# Patient Record
Sex: Female | Born: 1965 | Race: White | Hispanic: No | Marital: Married | Smoking: Never smoker
Health system: Southern US, Community
[De-identification: ages and names within clinical notes are randomized; demographics above are authoritative.]

## PROBLEM LIST (undated history)

## (undated) DIAGNOSIS — C50919 Malignant neoplasm of unspecified site of unspecified female breast: Secondary | ICD-10-CM

## (undated) DIAGNOSIS — IMO0001 Reserved for inherently not codable concepts without codable children: Secondary | ICD-10-CM

## (undated) DIAGNOSIS — Z8673 Personal history of transient ischemic attack (TIA), and cerebral infarction without residual deficits: Secondary | ICD-10-CM

## (undated) DIAGNOSIS — C801 Malignant (primary) neoplasm, unspecified: Secondary | ICD-10-CM

## (undated) DIAGNOSIS — F32A Depression, unspecified: Secondary | ICD-10-CM

## (undated) DIAGNOSIS — R51 Headache: Secondary | ICD-10-CM

## (undated) DIAGNOSIS — K219 Gastro-esophageal reflux disease without esophagitis: Secondary | ICD-10-CM

## (undated) DIAGNOSIS — F329 Major depressive disorder, single episode, unspecified: Secondary | ICD-10-CM

## (undated) DIAGNOSIS — F419 Anxiety disorder, unspecified: Secondary | ICD-10-CM

## (undated) HISTORY — PX: ABDOMINAL HYSTERECTOMY: SHX81

## (undated) HISTORY — DX: Malignant (primary) neoplasm, unspecified: C80.1

## (undated) HISTORY — PX: OTHER SURGICAL HISTORY: SHX169

## (undated) HISTORY — DX: Depression, unspecified: F32.A

## (undated) HISTORY — DX: Major depressive disorder, single episode, unspecified: F32.9

## (undated) HISTORY — DX: Malignant neoplasm of unspecified site of unspecified female breast: C50.919

## (undated) HISTORY — PX: PORTACATH PLACEMENT: SHX2246

## (undated) HISTORY — PX: BREAST RECONSTRUCTION: SHX9

## (undated) HISTORY — DX: Headache: R51

## (undated) HISTORY — DX: Personal history of transient ischemic attack (TIA), and cerebral infarction without residual deficits: Z86.73

## (undated) HISTORY — DX: Anxiety disorder, unspecified: F41.9

## (undated) HISTORY — PX: MASTECTOMY W/ NODES PARTIAL: SUR854

## (undated) HISTORY — PX: MASTECTOMY: SHX3

---

## 2000-02-10 ENCOUNTER — Other Ambulatory Visit: Admission: RE | Admit: 2000-02-10 | Discharge: 2000-02-10 | Payer: Self-pay | Admitting: Obstetrics and Gynecology

## 2001-02-27 ENCOUNTER — Other Ambulatory Visit: Admission: RE | Admit: 2001-02-27 | Discharge: 2001-02-27 | Payer: Self-pay | Admitting: Obstetrics and Gynecology

## 2002-01-10 ENCOUNTER — Other Ambulatory Visit: Admission: RE | Admit: 2002-01-10 | Discharge: 2002-01-10 | Payer: Self-pay | Admitting: Obstetrics and Gynecology

## 2002-06-03 ENCOUNTER — Inpatient Hospital Stay (HOSPITAL_COMMUNITY): Admission: AD | Admit: 2002-06-03 | Discharge: 2002-06-03 | Payer: Self-pay | Admitting: Obstetrics and Gynecology

## 2002-06-03 ENCOUNTER — Encounter: Payer: Self-pay | Admitting: Obstetrics and Gynecology

## 2002-06-27 ENCOUNTER — Observation Stay (HOSPITAL_COMMUNITY): Admission: AD | Admit: 2002-06-27 | Discharge: 2002-06-28 | Payer: Self-pay | Admitting: Obstetrics and Gynecology

## 2002-07-16 ENCOUNTER — Encounter (INDEPENDENT_AMBULATORY_CARE_PROVIDER_SITE_OTHER): Payer: Self-pay | Admitting: Specialist

## 2002-07-16 ENCOUNTER — Inpatient Hospital Stay (HOSPITAL_COMMUNITY): Admission: AD | Admit: 2002-07-16 | Discharge: 2002-07-18 | Payer: Self-pay | Admitting: Obstetrics and Gynecology

## 2002-08-27 ENCOUNTER — Other Ambulatory Visit: Admission: RE | Admit: 2002-08-27 | Discharge: 2002-08-27 | Payer: Self-pay | Admitting: Obstetrics and Gynecology

## 2003-09-11 ENCOUNTER — Other Ambulatory Visit: Admission: RE | Admit: 2003-09-11 | Discharge: 2003-09-11 | Payer: Self-pay | Admitting: Obstetrics and Gynecology

## 2004-04-30 ENCOUNTER — Inpatient Hospital Stay (HOSPITAL_COMMUNITY): Admission: AD | Admit: 2004-04-30 | Discharge: 2004-05-02 | Payer: Self-pay | Admitting: Obstetrics and Gynecology

## 2004-06-10 ENCOUNTER — Other Ambulatory Visit: Admission: RE | Admit: 2004-06-10 | Discharge: 2004-06-10 | Payer: Self-pay | Admitting: Obstetrics and Gynecology

## 2005-07-26 ENCOUNTER — Other Ambulatory Visit: Admission: RE | Admit: 2005-07-26 | Discharge: 2005-07-26 | Payer: Self-pay | Admitting: Obstetrics and Gynecology

## 2005-10-21 ENCOUNTER — Encounter: Admission: RE | Admit: 2005-10-21 | Discharge: 2005-10-21 | Payer: Self-pay | Admitting: Obstetrics and Gynecology

## 2005-10-21 ENCOUNTER — Encounter (INDEPENDENT_AMBULATORY_CARE_PROVIDER_SITE_OTHER): Payer: Self-pay | Admitting: Diagnostic Radiology

## 2005-10-21 ENCOUNTER — Encounter (INDEPENDENT_AMBULATORY_CARE_PROVIDER_SITE_OTHER): Payer: Self-pay | Admitting: Specialist

## 2005-11-02 ENCOUNTER — Encounter: Admission: RE | Admit: 2005-11-02 | Discharge: 2005-11-02 | Payer: Self-pay | Admitting: Surgery

## 2005-11-10 ENCOUNTER — Encounter: Admission: RE | Admit: 2005-11-10 | Discharge: 2005-11-10 | Payer: Self-pay | Admitting: Surgery

## 2005-11-15 ENCOUNTER — Encounter: Admission: RE | Admit: 2005-11-15 | Discharge: 2005-11-15 | Payer: Self-pay | Admitting: Surgery

## 2005-11-18 ENCOUNTER — Ambulatory Visit (HOSPITAL_BASED_OUTPATIENT_CLINIC_OR_DEPARTMENT_OTHER): Admission: RE | Admit: 2005-11-18 | Discharge: 2005-11-19 | Payer: Self-pay | Admitting: Surgery

## 2005-11-18 ENCOUNTER — Encounter (INDEPENDENT_AMBULATORY_CARE_PROVIDER_SITE_OTHER): Payer: Self-pay | Admitting: Specialist

## 2005-11-18 ENCOUNTER — Ambulatory Visit (HOSPITAL_COMMUNITY): Admission: RE | Admit: 2005-11-18 | Discharge: 2005-11-18 | Payer: Self-pay | Admitting: Surgery

## 2005-11-18 ENCOUNTER — Encounter: Admission: RE | Admit: 2005-11-18 | Discharge: 2005-11-18 | Payer: Self-pay | Admitting: Surgery

## 2005-11-22 ENCOUNTER — Ambulatory Visit: Payer: Self-pay | Admitting: Oncology

## 2005-12-14 ENCOUNTER — Encounter: Payer: Self-pay | Admitting: Cardiovascular Disease

## 2005-12-14 ENCOUNTER — Ambulatory Visit: Payer: Self-pay

## 2005-12-15 ENCOUNTER — Ambulatory Visit (HOSPITAL_BASED_OUTPATIENT_CLINIC_OR_DEPARTMENT_OTHER): Admission: RE | Admit: 2005-12-15 | Discharge: 2005-12-15 | Payer: Self-pay | Admitting: Surgery

## 2005-12-23 ENCOUNTER — Ambulatory Visit (HOSPITAL_COMMUNITY): Admission: RE | Admit: 2005-12-23 | Discharge: 2005-12-23 | Payer: Self-pay | Admitting: Oncology

## 2006-01-13 ENCOUNTER — Ambulatory Visit: Payer: Self-pay | Admitting: Oncology

## 2006-03-07 ENCOUNTER — Ambulatory Visit: Payer: Self-pay | Admitting: Oncology

## 2006-03-16 LAB — CBC WITH DIFFERENTIAL/PLATELET
BASO%: 1 % (ref 0.0–2.0)
Eosinophils Absolute: 0 10*3/uL (ref 0.0–0.5)
MCHC: 34.6 g/dL (ref 32.0–36.0)
MONO#: 0.2 10*3/uL (ref 0.1–0.9)
NEUT#: 0.4 10*3/uL — CL (ref 1.5–6.5)
RBC: 3.69 10*6/uL — ABNORMAL LOW (ref 3.70–5.32)
WBC: 1.3 10*3/uL — ABNORMAL LOW (ref 3.9–10.0)
lymph#: 0.8 10*3/uL — ABNORMAL LOW (ref 0.9–3.3)

## 2006-03-30 LAB — CBC WITH DIFFERENTIAL/PLATELET
BASO%: 0.3 % (ref 0.0–2.0)
Basophils Absolute: 0 10*3/uL (ref 0.0–0.1)
EOS%: 0 % (ref 0.0–7.0)
HCT: 37.5 % (ref 34.8–46.6)
HGB: 13.3 g/dL (ref 11.6–15.9)
LYMPH%: 4 % — ABNORMAL LOW (ref 14.0–48.0)
MCH: 33.3 pg (ref 26.0–34.0)
MCHC: 35.4 g/dL (ref 32.0–36.0)
MCV: 94.2 fL (ref 81.0–101.0)
NEUT%: 93.1 % — ABNORMAL HIGH (ref 39.6–76.8)
Platelets: 346 10*3/uL (ref 145–400)
lymph#: 0.5 10*3/uL — ABNORMAL LOW (ref 0.9–3.3)

## 2006-03-30 LAB — COMPREHENSIVE METABOLIC PANEL
AST: 22 U/L (ref 0–37)
BUN: 16 mg/dL (ref 6–23)
Calcium: 9.7 mg/dL (ref 8.4–10.5)
Chloride: 105 mEq/L (ref 96–112)
Creatinine, Ser: 0.8 mg/dL (ref 0.4–1.2)
Total Bilirubin: 0.7 mg/dL (ref 0.3–1.2)

## 2006-04-06 LAB — CBC WITH DIFFERENTIAL/PLATELET
BASO%: 1.5 % (ref 0.0–2.0)
Basophils Absolute: 0 10*3/uL (ref 0.0–0.1)
Eosinophils Absolute: 0 10*3/uL (ref 0.0–0.5)
HCT: 33 % — ABNORMAL LOW (ref 34.8–46.6)
HGB: 11.3 g/dL — ABNORMAL LOW (ref 11.6–15.9)
LYMPH%: 51.2 % — ABNORMAL HIGH (ref 14.0–48.0)
MCHC: 34.4 g/dL (ref 32.0–36.0)
MONO#: 0.1 10*3/uL (ref 0.1–0.9)
NEUT#: 0.3 10*3/uL — CL (ref 1.5–6.5)
NEUT%: 33.4 % — ABNORMAL LOW (ref 39.6–76.8)
Platelets: 78 10*3/uL — ABNORMAL LOW (ref 145–400)
WBC: 0.9 10*3/uL — CL (ref 3.9–10.0)
lymph#: 0.5 10*3/uL — ABNORMAL LOW (ref 0.9–3.3)

## 2006-04-20 LAB — CBC WITH DIFFERENTIAL/PLATELET
Basophils Absolute: 0 10*3/uL (ref 0.0–0.1)
EOS%: 0.1 % (ref 0.0–7.0)
Eosinophils Absolute: 0 10*3/uL (ref 0.0–0.5)
HCT: 37.9 % (ref 34.8–46.6)
HGB: 12.8 g/dL (ref 11.6–15.9)
LYMPH%: 4.8 % — ABNORMAL LOW (ref 14.0–48.0)
MCH: 33.8 pg (ref 26.0–34.0)
MCV: 100 fL (ref 81.0–101.0)
MONO%: 4.7 % (ref 0.0–13.0)
NEUT#: 12.6 10*3/uL — ABNORMAL HIGH (ref 1.5–6.5)
NEUT%: 90.2 % — ABNORMAL HIGH (ref 39.6–76.8)
Platelets: 317 10*3/uL (ref 145–400)
RDW: 15.4 % — ABNORMAL HIGH (ref 11.3–14.5)

## 2006-04-21 ENCOUNTER — Ambulatory Visit: Payer: Self-pay | Admitting: Oncology

## 2006-04-25 ENCOUNTER — Ambulatory Visit: Admission: RE | Admit: 2006-04-25 | Discharge: 2006-07-24 | Payer: Self-pay | Admitting: Radiation Oncology

## 2006-04-26 LAB — CBC WITH DIFFERENTIAL/PLATELET
EOS%: 0.7 % (ref 0.0–7.0)
Eosinophils Absolute: 0 10*3/uL (ref 0.0–0.5)
LYMPH%: 71.4 % — ABNORMAL HIGH (ref 14.0–48.0)
MCH: 34.6 pg — ABNORMAL HIGH (ref 26.0–34.0)
MCHC: 35.5 g/dL (ref 32.0–36.0)
MCV: 97.5 fL (ref 81.0–101.0)
MONO%: 7.7 % (ref 0.0–13.0)
Platelets: 59 10*3/uL — ABNORMAL LOW (ref 145–400)
RBC: 3.56 10*6/uL — ABNORMAL LOW (ref 3.70–5.32)
RDW: 13.4 % (ref 11.3–14.5)

## 2006-05-01 ENCOUNTER — Encounter: Admission: RE | Admit: 2006-05-01 | Discharge: 2006-05-01 | Payer: Self-pay | Admitting: Radiation Oncology

## 2006-05-03 LAB — CBC WITH DIFFERENTIAL/PLATELET
BASO%: 0.7 % (ref 0.0–2.0)
Eosinophils Absolute: 0 10*3/uL (ref 0.0–0.5)
LYMPH%: 19.5 % (ref 14.0–48.0)
MCHC: 33.5 g/dL (ref 32.0–36.0)
MONO#: 0.5 10*3/uL (ref 0.1–0.9)
MONO%: 7.9 % (ref 0.0–13.0)
NEUT#: 4.5 10*3/uL (ref 1.5–6.5)
Platelets: 194 10*3/uL (ref 145–400)
RBC: 3.62 10*6/uL — ABNORMAL LOW (ref 3.70–5.32)
RDW: 14.5 % (ref 11.3–14.5)
WBC: 6.3 10*3/uL (ref 3.9–10.0)

## 2006-05-11 ENCOUNTER — Ambulatory Visit (HOSPITAL_COMMUNITY): Admission: RE | Admit: 2006-05-11 | Discharge: 2006-05-11 | Payer: Self-pay | Admitting: Oncology

## 2006-05-19 ENCOUNTER — Encounter: Payer: Self-pay | Admitting: Vascular Surgery

## 2006-05-19 ENCOUNTER — Ambulatory Visit (HOSPITAL_BASED_OUTPATIENT_CLINIC_OR_DEPARTMENT_OTHER): Admission: RE | Admit: 2006-05-19 | Discharge: 2006-05-19 | Payer: Self-pay | Admitting: Oncology

## 2006-06-06 ENCOUNTER — Ambulatory Visit (HOSPITAL_COMMUNITY): Admission: RE | Admit: 2006-06-06 | Discharge: 2006-06-06 | Payer: Self-pay | Admitting: Orthopedic Surgery

## 2006-06-06 LAB — CBC WITH DIFFERENTIAL/PLATELET
Basophils Absolute: 0 10*3/uL (ref 0.0–0.1)
EOS%: 14.2 % — ABNORMAL HIGH (ref 0.0–7.0)
Eosinophils Absolute: 0.6 10*3/uL — ABNORMAL HIGH (ref 0.0–0.5)
HCT: 37.8 % (ref 34.8–46.6)
HGB: 13 g/dL (ref 11.6–15.9)
MCH: 33.3 pg (ref 26.0–34.0)
NEUT#: 2.7 10*3/uL (ref 1.5–6.5)
NEUT%: 58.8 % (ref 39.6–76.8)
lymph#: 0.9 10*3/uL (ref 0.9–3.3)

## 2006-06-13 LAB — CBC WITH DIFFERENTIAL/PLATELET
BASO%: 0.4 % (ref 0.0–2.0)
Eosinophils Absolute: 0.4 10*3/uL (ref 0.0–0.5)
LYMPH%: 18.4 % (ref 14.0–48.0)
MCHC: 34.1 g/dL (ref 32.0–36.0)
MCV: 96.9 fL (ref 81.0–101.0)
MONO#: 0.3 10*3/uL (ref 0.1–0.9)
MONO%: 6.7 % (ref 0.0–13.0)
NEUT#: 2.6 10*3/uL (ref 1.5–6.5)
Platelets: 186 10*3/uL (ref 145–400)
RBC: 3.99 10*6/uL (ref 3.70–5.32)
RDW: 12.6 % (ref 11.3–14.5)
WBC: 4 10*3/uL (ref 3.9–10.0)

## 2006-06-20 LAB — CBC WITH DIFFERENTIAL/PLATELET
BASO%: 0.3 % (ref 0.0–2.0)
Eosinophils Absolute: 0.3 10*3/uL (ref 0.0–0.5)
MCHC: 34.7 g/dL (ref 32.0–36.0)
MONO#: 0.3 10*3/uL (ref 0.1–0.9)
NEUT#: 2.5 10*3/uL (ref 1.5–6.5)
Platelets: 172 10*3/uL (ref 145–400)
RBC: 4.08 10*6/uL (ref 3.70–5.32)
RDW: 12.3 % (ref 11.3–14.5)
WBC: 3.9 10*3/uL (ref 3.9–10.0)
lymph#: 0.8 10*3/uL — ABNORMAL LOW (ref 0.9–3.3)

## 2006-06-27 LAB — CBC WITH DIFFERENTIAL/PLATELET
BASO%: 0.6 % (ref 0.0–2.0)
Basophils Absolute: 0 10*3/uL (ref 0.0–0.1)
Eosinophils Absolute: 0.2 10*3/uL (ref 0.0–0.5)
HCT: 38.9 % (ref 34.8–46.6)
HGB: 13.5 g/dL (ref 11.6–15.9)
LYMPH%: 21.5 % (ref 14.0–48.0)
MCHC: 34.6 g/dL (ref 32.0–36.0)
MONO#: 0.3 10*3/uL (ref 0.1–0.9)
NEUT#: 2.1 10*3/uL (ref 1.5–6.5)
NEUT%: 63.9 % (ref 39.6–76.8)
Platelets: 166 10*3/uL (ref 145–400)
WBC: 3.4 10*3/uL — ABNORMAL LOW (ref 3.9–10.0)
lymph#: 0.7 10*3/uL — ABNORMAL LOW (ref 0.9–3.3)

## 2006-06-28 ENCOUNTER — Ambulatory Visit: Payer: Self-pay | Admitting: Oncology

## 2006-07-04 LAB — CBC WITH DIFFERENTIAL/PLATELET
Basophils Absolute: 0 10*3/uL (ref 0.0–0.1)
EOS%: 4.1 % (ref 0.0–7.0)
HCT: 38.9 % (ref 34.8–46.6)
HGB: 13.3 g/dL (ref 11.6–15.9)
LYMPH%: 20.1 % (ref 14.0–48.0)
MCH: 32.6 pg (ref 26.0–34.0)
NEUT%: 65.5 % (ref 39.6–76.8)
Platelets: 170 10*3/uL (ref 145–400)
lymph#: 0.7 10*3/uL — ABNORMAL LOW (ref 0.9–3.3)

## 2006-07-11 LAB — CBC WITH DIFFERENTIAL/PLATELET
BASO%: 0.4 % (ref 0.0–2.0)
Basophils Absolute: 0 10*3/uL (ref 0.0–0.1)
HCT: 40.3 % (ref 34.8–46.6)
HGB: 13.8 g/dL (ref 11.6–15.9)
MONO#: 0.2 10*3/uL (ref 0.1–0.9)
NEUT%: 69.7 % (ref 39.6–76.8)
RDW: 11.9 % (ref 11.3–14.5)
WBC: 3.2 10*3/uL — ABNORMAL LOW (ref 3.9–10.0)
lymph#: 0.6 10*3/uL — ABNORMAL LOW (ref 0.9–3.3)

## 2006-07-27 ENCOUNTER — Ambulatory Visit (HOSPITAL_BASED_OUTPATIENT_CLINIC_OR_DEPARTMENT_OTHER): Admission: RE | Admit: 2006-07-27 | Discharge: 2006-07-27 | Payer: Self-pay | Admitting: Surgery

## 2006-09-21 ENCOUNTER — Ambulatory Visit: Payer: Self-pay | Admitting: Oncology

## 2006-09-25 LAB — COMPREHENSIVE METABOLIC PANEL WITH GFR
ALT: 16 U/L (ref 0–40)
AST: 20 U/L (ref 0–37)
Albumin: 4.5 g/dL (ref 3.5–5.2)
Alkaline Phosphatase: 67 U/L (ref 39–117)
BUN: 10 mg/dL (ref 6–23)
CO2: 26 meq/L (ref 19–32)
Calcium: 9.2 mg/dL (ref 8.4–10.5)
Chloride: 109 meq/L (ref 96–112)
Creatinine, Ser: 0.85 mg/dL (ref 0.40–1.20)
Glucose, Bld: 108 mg/dL — ABNORMAL HIGH (ref 70–99)
Potassium: 4.6 meq/L (ref 3.5–5.3)
Sodium: 146 meq/L — ABNORMAL HIGH (ref 135–145)
Total Bilirubin: 0.6 mg/dL (ref 0.3–1.2)
Total Protein: 6.8 g/dL (ref 6.0–8.3)

## 2006-09-25 LAB — CBC WITH DIFFERENTIAL/PLATELET
BASO%: 0.4 % (ref 0.0–2.0)
Basophils Absolute: 0 10e3/uL (ref 0.0–0.1)
EOS%: 1.8 % (ref 0.0–7.0)
Eosinophils Absolute: 0.1 10e3/uL (ref 0.0–0.5)
HCT: 39.5 % (ref 34.8–46.6)
HGB: 13.7 g/dL (ref 11.6–15.9)
LYMPH%: 23.7 % (ref 14.0–48.0)
MCH: 32.2 pg (ref 26.0–34.0)
MCHC: 34.6 g/dL (ref 32.0–36.0)
MCV: 93 fL (ref 81.0–101.0)
MONO#: 0.3 10e3/uL (ref 0.1–0.9)
MONO%: 6.3 % (ref 0.0–13.0)
NEUT#: 3.2 10e3/uL (ref 1.5–6.5)
NEUT%: 67.8 % (ref 39.6–76.8)
Platelets: 185 10e3/uL (ref 145–400)
RBC: 4.25 10e6/uL (ref 3.70–5.32)
RDW: 13.6 % (ref 11.3–14.5)
WBC: 4.7 10e3/uL (ref 3.9–10.0)
lymph#: 1.1 10e3/uL (ref 0.9–3.3)

## 2006-09-25 LAB — CANCER ANTIGEN 27.29: CA 27.29: 17 U/mL (ref 0–39)

## 2006-10-09 ENCOUNTER — Encounter: Admission: RE | Admit: 2006-10-09 | Discharge: 2006-10-09 | Payer: Self-pay | Admitting: Oncology

## 2006-12-20 ENCOUNTER — Ambulatory Visit: Payer: Self-pay | Admitting: Oncology

## 2006-12-25 LAB — CBC WITH DIFFERENTIAL/PLATELET
Basophils Absolute: 0 10*3/uL (ref 0.0–0.1)
HCT: 40 % (ref 34.8–46.6)
HGB: 13.5 g/dL (ref 11.6–15.9)
LYMPH%: 23.6 % (ref 14.0–48.0)
MCH: 31.9 pg (ref 26.0–34.0)
MONO#: 0.2 10*3/uL (ref 0.1–0.9)
NEUT%: 69.7 % (ref 39.6–76.8)
Platelets: 171 10*3/uL (ref 145–400)
WBC: 4.3 10*3/uL (ref 3.9–10.0)
lymph#: 1 10*3/uL (ref 0.9–3.3)

## 2006-12-25 LAB — COMPREHENSIVE METABOLIC PANEL
BUN: 15 mg/dL (ref 6–23)
CO2: 26 mEq/L (ref 19–32)
Calcium: 9.7 mg/dL (ref 8.4–10.5)
Chloride: 107 mEq/L (ref 96–112)
Creatinine, Ser: 0.86 mg/dL (ref 0.40–1.20)
Glucose, Bld: 84 mg/dL (ref 70–99)

## 2007-03-21 ENCOUNTER — Ambulatory Visit: Payer: Self-pay | Admitting: Oncology

## 2007-03-23 LAB — CBC WITH DIFFERENTIAL/PLATELET
Basophils Absolute: 0 10*3/uL (ref 0.0–0.1)
EOS%: 0.7 % (ref 0.0–7.0)
Eosinophils Absolute: 0 10*3/uL (ref 0.0–0.5)
HCT: 39.1 % (ref 34.8–46.6)
HGB: 13.8 g/dL (ref 11.6–15.9)
MCH: 33 pg (ref 26.0–34.0)
MCV: 93.4 fL (ref 81.0–101.0)
NEUT#: 3.2 10*3/uL (ref 1.5–6.5)
NEUT%: 67.1 % (ref 39.6–76.8)
lymph#: 1.2 10*3/uL (ref 0.9–3.3)

## 2007-03-23 LAB — COMPREHENSIVE METABOLIC PANEL
AST: 17 U/L (ref 0–37)
Albumin: 4.5 g/dL (ref 3.5–5.2)
BUN: 12 mg/dL (ref 6–23)
Calcium: 9.2 mg/dL (ref 8.4–10.5)
Chloride: 107 mEq/L (ref 96–112)
Creatinine, Ser: 0.87 mg/dL (ref 0.40–1.20)
Glucose, Bld: 81 mg/dL (ref 70–99)

## 2007-03-23 LAB — LACTATE DEHYDROGENASE: LDH: 132 U/L (ref 94–250)

## 2007-03-23 LAB — CANCER ANTIGEN 27.29: CA 27.29: 21 U/mL (ref 0–39)

## 2007-07-29 ENCOUNTER — Ambulatory Visit: Payer: Self-pay | Admitting: Oncology

## 2007-08-02 LAB — CBC WITH DIFFERENTIAL/PLATELET
BASO%: 0.4 % (ref 0.0–2.0)
EOS%: 1.1 % (ref 0.0–7.0)
MCH: 32.7 pg (ref 26.0–34.0)
MCHC: 35 g/dL (ref 32.0–36.0)
RBC: 4.05 10*6/uL (ref 3.70–5.32)
RDW: 12.7 % (ref 11.3–14.5)
lymph#: 1.2 10*3/uL (ref 0.9–3.3)

## 2007-08-03 LAB — COMPREHENSIVE METABOLIC PANEL
ALT: 15 U/L (ref 0–35)
AST: 19 U/L (ref 0–37)
Albumin: 4.4 g/dL (ref 3.5–5.2)
Calcium: 8.9 mg/dL (ref 8.4–10.5)
Chloride: 102 mEq/L (ref 96–112)
Creatinine, Ser: 1.02 mg/dL (ref 0.40–1.20)
Potassium: 4.2 mEq/L (ref 3.5–5.3)
Sodium: 138 mEq/L (ref 135–145)

## 2007-08-03 LAB — FSH/LH: LH: 30 m[IU]/mL

## 2007-08-06 ENCOUNTER — Encounter (HOSPITAL_COMMUNITY): Admission: RE | Admit: 2007-08-06 | Discharge: 2007-09-05 | Payer: Self-pay | Admitting: Oncology

## 2007-10-15 ENCOUNTER — Encounter: Admission: RE | Admit: 2007-10-15 | Discharge: 2007-10-15 | Payer: Self-pay | Admitting: Oncology

## 2007-10-17 ENCOUNTER — Encounter: Admission: RE | Admit: 2007-10-17 | Discharge: 2007-10-17 | Payer: Self-pay | Admitting: Oncology

## 2007-12-21 ENCOUNTER — Ambulatory Visit: Payer: Self-pay | Admitting: Oncology

## 2007-12-21 LAB — FSH/LH: LH: 20.6 m[IU]/mL

## 2007-12-21 LAB — COMPREHENSIVE METABOLIC PANEL
ALT: 14 U/L (ref 0–35)
AST: 21 U/L (ref 0–37)
Creatinine, Ser: 0.87 mg/dL (ref 0.40–1.20)
Sodium: 139 mEq/L (ref 135–145)
Total Bilirubin: 0.5 mg/dL (ref 0.3–1.2)
Total Protein: 7.2 g/dL (ref 6.0–8.3)

## 2007-12-21 LAB — CBC WITH DIFFERENTIAL/PLATELET
BASO%: 0.3 % (ref 0.0–2.0)
EOS%: 0.5 % (ref 0.0–7.0)
HGB: 13.7 g/dL (ref 11.6–15.9)
LYMPH%: 23.9 % (ref 14.0–48.0)
MCHC: 34.8 g/dL (ref 32.0–36.0)
MCV: 92.1 fL (ref 81.0–101.0)
MONO#: 0.3 10*3/uL (ref 0.1–0.9)
NEUT#: 3.8 10*3/uL (ref 1.5–6.5)
NEUT%: 69.7 % (ref 39.6–76.8)
RBC: 4.27 10*6/uL (ref 3.70–5.32)
lymph#: 1.3 10*3/uL (ref 0.9–3.3)

## 2007-12-30 LAB — ESTRADIOL, ULTRA SENS: Estradiol, Ultra Sensitive: 71 pg/mL

## 2008-01-02 ENCOUNTER — Encounter: Admission: RE | Admit: 2008-01-02 | Discharge: 2008-01-02 | Payer: Self-pay | Admitting: Surgery

## 2008-04-22 ENCOUNTER — Ambulatory Visit: Payer: Self-pay | Admitting: Oncology

## 2008-04-25 LAB — CBC WITH DIFFERENTIAL/PLATELET
Basophils Absolute: 0 10*3/uL (ref 0.0–0.1)
EOS%: 0.8 % (ref 0.0–7.0)
HGB: 13.3 g/dL (ref 11.6–15.9)
MCH: 31.8 pg (ref 26.0–34.0)
NEUT#: 2.9 10*3/uL (ref 1.5–6.5)
RDW: 12.3 % (ref 11.3–14.5)
lymph#: 1.1 10*3/uL (ref 0.9–3.3)

## 2008-04-28 LAB — FSH/LH: LH: 30.6 m[IU]/mL

## 2008-04-28 LAB — COMPREHENSIVE METABOLIC PANEL
ALT: 15 U/L (ref 0–35)
AST: 21 U/L (ref 0–37)
Albumin: 4.2 g/dL (ref 3.5–5.2)
BUN: 15 mg/dL (ref 6–23)
Calcium: 9 mg/dL (ref 8.4–10.5)
Chloride: 108 mEq/L (ref 96–112)
Potassium: 3.8 mEq/L (ref 3.5–5.3)
Total Protein: 6.8 g/dL (ref 6.0–8.3)

## 2008-04-28 LAB — VITAMIN D 25 HYDROXY (VIT D DEFICIENCY, FRACTURES): Vit D, 25-Hydroxy: 49 ng/mL (ref 30–89)

## 2008-05-01 LAB — ESTRADIOL, ULTRA SENS: Estradiol, Ultra Sensitive: 124 pg/mL

## 2008-07-15 ENCOUNTER — Encounter (INDEPENDENT_AMBULATORY_CARE_PROVIDER_SITE_OTHER): Payer: Self-pay | Admitting: Obstetrics and Gynecology

## 2008-07-15 ENCOUNTER — Ambulatory Visit (HOSPITAL_COMMUNITY): Admission: RE | Admit: 2008-07-15 | Discharge: 2008-07-16 | Payer: Self-pay | Admitting: Obstetrics and Gynecology

## 2008-09-02 ENCOUNTER — Ambulatory Visit: Payer: Self-pay | Admitting: Oncology

## 2008-09-04 LAB — CBC WITH DIFFERENTIAL/PLATELET
BASO%: 0.3 % (ref 0.0–2.0)
EOS%: 1.1 % (ref 0.0–7.0)
LYMPH%: 29.9 % (ref 14.0–48.0)
MCHC: 34.3 g/dL (ref 32.0–36.0)
MCV: 94.1 fL (ref 81.0–101.0)
MONO%: 6.4 % (ref 0.0–13.0)
Platelets: 160 10*3/uL (ref 145–400)
RBC: 4.25 10*6/uL (ref 3.70–5.32)
WBC: 4.4 10*3/uL (ref 3.9–10.0)

## 2008-09-05 LAB — COMPREHENSIVE METABOLIC PANEL
ALT: 14 U/L (ref 0–35)
AST: 19 U/L (ref 0–37)
Alkaline Phosphatase: 39 U/L (ref 39–117)
Sodium: 138 mEq/L (ref 135–145)
Total Bilirubin: 0.7 mg/dL (ref 0.3–1.2)
Total Protein: 7.1 g/dL (ref 6.0–8.3)

## 2008-09-05 LAB — CANCER ANTIGEN 27.29: CA 27.29: 22 U/mL (ref 0–39)

## 2008-09-25 LAB — ESTRADIOL, ULTRA SENS

## 2008-11-04 ENCOUNTER — Encounter: Admission: RE | Admit: 2008-11-04 | Discharge: 2008-11-04 | Payer: Self-pay | Admitting: Oncology

## 2009-01-05 ENCOUNTER — Ambulatory Visit: Payer: Self-pay | Admitting: Oncology

## 2009-01-07 LAB — CBC WITH DIFFERENTIAL/PLATELET
BASO%: 0.3 % (ref 0.0–2.0)
MCHC: 34.1 g/dL (ref 32.0–36.0)
MONO#: 0.3 10*3/uL (ref 0.1–0.9)
RBC: 4.57 10*6/uL (ref 3.70–5.32)
WBC: 4.8 10*3/uL (ref 3.9–10.0)
lymph#: 1.3 10*3/uL (ref 0.9–3.3)

## 2009-01-08 LAB — COMPREHENSIVE METABOLIC PANEL
ALT: 15 U/L (ref 0–35)
CO2: 26 mEq/L (ref 19–32)
Calcium: 9.7 mg/dL (ref 8.4–10.5)
Chloride: 102 mEq/L (ref 96–112)
Potassium: 3.8 mEq/L (ref 3.5–5.3)
Sodium: 139 mEq/L (ref 135–145)
Total Protein: 7.5 g/dL (ref 6.0–8.3)

## 2009-01-16 LAB — ESTRADIOL, ULTRA SENS

## 2009-01-27 ENCOUNTER — Ambulatory Visit: Payer: Self-pay | Admitting: Psychiatry

## 2009-07-15 ENCOUNTER — Ambulatory Visit: Payer: Self-pay | Admitting: Oncology

## 2009-07-17 LAB — CBC WITH DIFFERENTIAL/PLATELET
BASO%: 0.5 % (ref 0.0–2.0)
EOS%: 1 % (ref 0.0–7.0)
MCH: 31.2 pg (ref 25.1–34.0)
MCHC: 33.7 g/dL (ref 31.5–36.0)
NEUT%: 64.9 % (ref 38.4–76.8)
RBC: 4.34 10*6/uL (ref 3.70–5.45)
RDW: 13 % (ref 11.2–14.5)
lymph#: 1.4 10*3/uL (ref 0.9–3.3)

## 2009-07-20 LAB — COMPREHENSIVE METABOLIC PANEL
AST: 18 U/L (ref 0–37)
BUN: 11 mg/dL (ref 6–23)
Calcium: 9.2 mg/dL (ref 8.4–10.5)
Chloride: 105 mEq/L (ref 96–112)
Creatinine, Ser: 0.95 mg/dL (ref 0.40–1.20)
Total Bilirubin: 0.7 mg/dL (ref 0.3–1.2)

## 2009-07-20 LAB — VITAMIN D 25 HYDROXY (VIT D DEFICIENCY, FRACTURES): Vit D, 25-Hydroxy: 41 ng/mL (ref 30–89)

## 2009-07-20 LAB — LACTATE DEHYDROGENASE: LDH: 179 U/L (ref 94–250)

## 2009-11-10 ENCOUNTER — Encounter: Admission: RE | Admit: 2009-11-10 | Discharge: 2009-11-10 | Payer: Self-pay | Admitting: Oncology

## 2009-11-10 ENCOUNTER — Ambulatory Visit (HOSPITAL_COMMUNITY): Admission: RE | Admit: 2009-11-10 | Discharge: 2009-11-10 | Payer: Self-pay | Admitting: Oncology

## 2009-11-18 ENCOUNTER — Encounter: Admission: RE | Admit: 2009-11-18 | Discharge: 2009-11-18 | Payer: Self-pay | Admitting: Oncology

## 2009-12-14 ENCOUNTER — Ambulatory Visit (HOSPITAL_COMMUNITY): Admission: RE | Admit: 2009-12-14 | Discharge: 2009-12-14 | Payer: Self-pay | Admitting: Oncology

## 2009-12-15 ENCOUNTER — Ambulatory Visit: Admission: RE | Admit: 2009-12-15 | Discharge: 2010-01-18 | Payer: Self-pay | Admitting: Radiation Oncology

## 2009-12-18 ENCOUNTER — Ambulatory Visit: Payer: Self-pay | Admitting: Oncology

## 2009-12-21 ENCOUNTER — Ambulatory Visit (HOSPITAL_COMMUNITY): Admission: RE | Admit: 2009-12-21 | Discharge: 2009-12-21 | Payer: Self-pay | Admitting: Oncology

## 2010-01-25 ENCOUNTER — Inpatient Hospital Stay (HOSPITAL_COMMUNITY): Admission: RE | Admit: 2010-01-25 | Discharge: 2010-01-30 | Payer: Self-pay | Admitting: Surgery

## 2010-01-25 ENCOUNTER — Encounter (INDEPENDENT_AMBULATORY_CARE_PROVIDER_SITE_OTHER): Payer: Self-pay | Admitting: Surgery

## 2010-01-26 ENCOUNTER — Ambulatory Visit: Payer: Self-pay | Admitting: Oncology

## 2010-02-04 LAB — COMPREHENSIVE METABOLIC PANEL
Alkaline Phosphatase: 53 U/L (ref 39–117)
CO2: 27 mEq/L (ref 19–32)
Calcium: 8.6 mg/dL (ref 8.4–10.5)
Chloride: 104 mEq/L (ref 96–112)
Creatinine, Ser: 0.82 mg/dL (ref 0.40–1.20)
Glucose, Bld: 129 mg/dL — ABNORMAL HIGH (ref 70–99)
Potassium: 4 mEq/L (ref 3.5–5.3)
Sodium: 137 mEq/L (ref 135–145)
Total Protein: 5.4 g/dL — ABNORMAL LOW (ref 6.0–8.3)

## 2010-02-04 LAB — LACTATE DEHYDROGENASE: LDH: 171 U/L (ref 94–250)

## 2010-02-04 LAB — CBC WITH DIFFERENTIAL/PLATELET
BASO%: 0.4 % (ref 0.0–2.0)
HCT: 27.8 % — ABNORMAL LOW (ref 34.8–46.6)
HGB: 9.3 g/dL — ABNORMAL LOW (ref 11.6–15.9)
MCHC: 33.5 g/dL (ref 31.5–36.0)
MCV: 93.7 fL (ref 79.5–101.0)
NEUT#: 3.8 10*3/uL (ref 1.5–6.5)
NEUT%: 75.3 % (ref 38.4–76.8)
RBC: 2.96 10*6/uL — ABNORMAL LOW (ref 3.70–5.45)
WBC: 5 10*3/uL (ref 3.9–10.3)

## 2010-02-04 LAB — CANCER ANTIGEN 27.29: CA 27.29: 15 U/mL (ref 0–39)

## 2010-02-04 LAB — VITAMIN D 25 HYDROXY (VIT D DEFICIENCY, FRACTURES): Vit D, 25-Hydroxy: 29 ng/mL — ABNORMAL LOW (ref 30–89)

## 2010-02-26 ENCOUNTER — Ambulatory Visit: Payer: Self-pay | Admitting: Oncology

## 2010-03-02 LAB — CBC WITH DIFFERENTIAL/PLATELET
BASO%: 0.6 % (ref 0.0–2.0)
Basophils Absolute: 0 10*3/uL (ref 0.0–0.1)
Eosinophils Absolute: 0.1 10*3/uL (ref 0.0–0.5)
HCT: 38.3 % (ref 34.8–46.6)
HGB: 12.9 g/dL (ref 11.6–15.9)
LYMPH%: 22.3 % (ref 14.0–49.7)
MCHC: 33.6 g/dL (ref 31.5–36.0)
MCV: 94.9 fL (ref 79.5–101.0)
MONO#: 0.3 10*3/uL (ref 0.1–0.9)
MONO%: 6.6 % (ref 0.0–14.0)
NEUT#: 3.1 10*3/uL (ref 1.5–6.5)
RDW: 14.6 % — ABNORMAL HIGH (ref 11.2–14.5)
WBC: 4.5 10*3/uL (ref 3.9–10.3)
lymph#: 1 10*3/uL (ref 0.9–3.3)

## 2010-03-02 LAB — COMPREHENSIVE METABOLIC PANEL
ALT: 13 U/L (ref 0–35)
Creatinine, Ser: 0.98 mg/dL (ref 0.40–1.20)
Total Bilirubin: 0.5 mg/dL (ref 0.3–1.2)

## 2010-03-02 LAB — IRON AND TIBC: TIBC: 370 ug/dL (ref 250–470)

## 2010-03-10 ENCOUNTER — Ambulatory Visit (HOSPITAL_COMMUNITY): Admission: RE | Admit: 2010-03-10 | Discharge: 2010-03-10 | Payer: Self-pay | Admitting: Surgery

## 2010-03-18 LAB — COMPREHENSIVE METABOLIC PANEL
AST: 34 U/L (ref 0–37)
Albumin: 4.5 g/dL (ref 3.5–5.2)
CO2: 27 mEq/L (ref 19–32)
Calcium: 9.1 mg/dL (ref 8.4–10.5)
Creatinine, Ser: 0.8 mg/dL (ref 0.40–1.20)
Potassium: 4.2 mEq/L (ref 3.5–5.3)
Sodium: 141 mEq/L (ref 135–145)
Total Bilirubin: 0.5 mg/dL (ref 0.3–1.2)
Total Protein: 5.9 g/dL — ABNORMAL LOW (ref 6.0–8.3)

## 2010-03-18 LAB — CBC WITH DIFFERENTIAL/PLATELET
BASO%: 0.9 % (ref 0.0–2.0)
Basophils Absolute: 0 10*3/uL (ref 0.0–0.1)
EOS%: 1.6 % (ref 0.0–7.0)
Eosinophils Absolute: 0 10*3/uL (ref 0.0–0.5)
HGB: 12.6 g/dL (ref 11.6–15.9)
MCH: 31.8 pg (ref 25.1–34.0)
MCV: 93.6 fL (ref 79.5–101.0)
MONO%: 2.6 % (ref 0.0–14.0)
Platelets: 106 10*3/uL — ABNORMAL LOW (ref 145–400)
RBC: 3.95 10*6/uL (ref 3.70–5.45)
WBC: 1.1 10*3/uL — ABNORMAL LOW (ref 3.9–10.3)
lymph#: 0.7 10*3/uL — ABNORMAL LOW (ref 0.9–3.3)

## 2010-03-25 LAB — COMPREHENSIVE METABOLIC PANEL
ALT: 31 U/L (ref 0–35)
AST: 23 U/L (ref 0–37)
CO2: 23 mEq/L (ref 19–32)
Glucose, Bld: 89 mg/dL (ref 70–99)
Potassium: 3.8 mEq/L (ref 3.5–5.3)
Sodium: 139 mEq/L (ref 135–145)
Total Protein: 6.1 g/dL (ref 6.0–8.3)

## 2010-03-25 LAB — CBC WITH DIFFERENTIAL/PLATELET
BASO%: 2.2 % — ABNORMAL HIGH (ref 0.0–2.0)
Basophils Absolute: 0.1 10*3/uL (ref 0.0–0.1)
EOS%: 1.6 % (ref 0.0–7.0)
Eosinophils Absolute: 0.1 10*3/uL (ref 0.0–0.5)
HGB: 14.3 g/dL (ref 11.6–15.9)
MCHC: 33 g/dL (ref 31.5–36.0)
MONO#: 0.4 10*3/uL (ref 0.1–0.9)
RDW: 13.7 % (ref 11.2–14.5)

## 2010-03-29 ENCOUNTER — Ambulatory Visit: Payer: Self-pay | Admitting: Oncology

## 2010-03-31 LAB — CBC WITH DIFFERENTIAL/PLATELET
BASO%: 0.6 % (ref 0.0–2.0)
Basophils Absolute: 0 10*3/uL (ref 0.0–0.1)
MCHC: 33.9 g/dL (ref 31.5–36.0)
MCV: 94.2 fL (ref 79.5–101.0)
MONO%: 7.9 % (ref 0.0–14.0)
NEUT#: 1.2 10*3/uL — ABNORMAL LOW (ref 1.5–6.5)
NEUT%: 48.8 % (ref 38.4–76.8)
Platelets: 264 10*3/uL (ref 145–400)
RDW: 14.6 % — ABNORMAL HIGH (ref 11.2–14.5)

## 2010-04-15 LAB — COMPREHENSIVE METABOLIC PANEL
ALT: 15 U/L (ref 0–35)
AST: 22 U/L (ref 0–37)
Albumin: 4.2 g/dL (ref 3.5–5.2)
Alkaline Phosphatase: 61 U/L (ref 39–117)
Calcium: 8.6 mg/dL (ref 8.4–10.5)
Glucose, Bld: 87 mg/dL (ref 70–99)
Potassium: 4.1 mEq/L (ref 3.5–5.3)
Sodium: 139 mEq/L (ref 135–145)

## 2010-04-15 LAB — CBC WITH DIFFERENTIAL/PLATELET
EOS%: 1 % (ref 0.0–7.0)
Eosinophils Absolute: 0.1 10*3/uL (ref 0.0–0.5)
LYMPH%: 31.3 % (ref 14.0–49.7)
MCH: 30.5 pg (ref 25.1–34.0)
NEUT%: 57 % (ref 38.4–76.8)
Platelets: 124 10*3/uL — ABNORMAL LOW (ref 145–400)
WBC: 5 10*3/uL (ref 3.9–10.3)
lymph#: 1.6 10*3/uL (ref 0.9–3.3)

## 2010-04-22 LAB — CBC WITH DIFFERENTIAL/PLATELET
Basophils Absolute: 0 10*3/uL (ref 0.0–0.1)
Eosinophils Absolute: 0 10*3/uL (ref 0.0–0.5)
HGB: 13.3 g/dL (ref 11.6–15.9)
LYMPH%: 30.3 % (ref 14.0–49.7)
MCH: 31.5 pg (ref 25.1–34.0)
MCV: 91.3 fL (ref 79.5–101.0)
MONO#: 0.1 10*3/uL (ref 0.1–0.9)
MONO%: 3.4 % (ref 0.0–14.0)
NEUT#: 1.9 10*3/uL (ref 1.5–6.5)
Platelets: 145 10*3/uL (ref 145–400)
WBC: 2.9 10*3/uL — ABNORMAL LOW (ref 3.9–10.3)

## 2010-05-06 ENCOUNTER — Ambulatory Visit: Payer: Self-pay | Admitting: Oncology

## 2010-05-07 LAB — COMPREHENSIVE METABOLIC PANEL
ALT: 19 U/L (ref 0–35)
AST: 19 U/L (ref 0–37)
Alkaline Phosphatase: 62 U/L (ref 39–117)
Creatinine, Ser: 0.83 mg/dL (ref 0.40–1.20)
Sodium: 141 mEq/L (ref 135–145)
Total Bilirubin: 0.6 mg/dL (ref 0.3–1.2)
Total Protein: 6.3 g/dL (ref 6.0–8.3)

## 2010-05-07 LAB — CBC WITH DIFFERENTIAL/PLATELET
BASO%: 0.7 % (ref 0.0–2.0)
EOS%: 1.1 % (ref 0.0–7.0)
LYMPH%: 25.3 % (ref 14.0–49.7)
MONO%: 9.3 % (ref 0.0–14.0)
NEUT#: 2.8 10*3/uL (ref 1.5–6.5)
Platelets: 173 10*3/uL (ref 145–400)
WBC: 4.4 10*3/uL (ref 3.9–10.3)

## 2010-05-14 LAB — CBC WITH DIFFERENTIAL/PLATELET
Basophils Absolute: 0 10*3/uL (ref 0.0–0.1)
EOS%: 1 % (ref 0.0–7.0)
HGB: 12.2 g/dL (ref 11.6–15.9)
MCH: 31.4 pg (ref 25.1–34.0)
MCV: 90.6 fL (ref 79.5–101.0)
MONO%: 4.4 % (ref 0.0–14.0)
NEUT#: 2.3 10*3/uL (ref 1.5–6.5)
NEUT%: 76.6 % (ref 38.4–76.8)
RBC: 3.88 10*6/uL (ref 3.70–5.45)
WBC: 3 10*3/uL — ABNORMAL LOW (ref 3.9–10.3)
lymph#: 0.5 10*3/uL — ABNORMAL LOW (ref 0.9–3.3)

## 2010-05-27 LAB — COMPREHENSIVE METABOLIC PANEL
Alkaline Phosphatase: 57 U/L (ref 39–117)
CO2: 22 mEq/L (ref 19–32)
Calcium: 8.5 mg/dL (ref 8.4–10.5)
Chloride: 107 mEq/L (ref 96–112)
Sodium: 141 mEq/L (ref 135–145)
Total Bilirubin: 0.4 mg/dL (ref 0.3–1.2)
Total Protein: 6.1 g/dL (ref 6.0–8.3)

## 2010-05-27 LAB — CBC WITH DIFFERENTIAL/PLATELET
HGB: 12.9 g/dL (ref 11.6–15.9)
MCH: 29.9 pg (ref 25.1–34.0)
MCHC: 32.1 g/dL (ref 31.5–36.0)
MCV: 93.1 fL (ref 79.5–101.0)
MONO%: 9.5 % (ref 0.0–14.0)
NEUT#: 1.8 10*3/uL (ref 1.5–6.5)
NEUT%: 60.8 % (ref 38.4–76.8)
Platelets: 194 10*3/uL (ref 145–400)
WBC: 3 10*3/uL — ABNORMAL LOW (ref 3.9–10.3)
lymph#: 0.8 10*3/uL — ABNORMAL LOW (ref 0.9–3.3)

## 2010-06-03 LAB — CBC WITH DIFFERENTIAL/PLATELET
BASO%: 0.4 % (ref 0.0–2.0)
EOS%: 1.2 % (ref 0.0–7.0)
HGB: 11.9 g/dL (ref 11.6–15.9)
MCHC: 34 g/dL (ref 31.5–36.0)
MCV: 91.2 fL (ref 79.5–101.0)
NEUT%: 76 % (ref 38.4–76.8)
Platelets: 176 10*3/uL (ref 145–400)
RBC: 3.84 10*6/uL (ref 3.70–5.45)
RDW: 14.9 % — ABNORMAL HIGH (ref 11.2–14.5)

## 2010-06-15 ENCOUNTER — Ambulatory Visit: Payer: Self-pay | Admitting: Oncology

## 2010-06-17 LAB — CBC WITH DIFFERENTIAL/PLATELET
BASO%: 0.6 % (ref 0.0–2.0)
Basophils Absolute: 0 10e3/uL (ref 0.0–0.1)
EOS%: 1.5 % (ref 0.0–7.0)
Eosinophils Absolute: 0.1 10e3/uL (ref 0.0–0.5)
HCT: 37.5 % (ref 34.8–46.6)
HGB: 12.1 g/dL (ref 11.6–15.9)
LYMPH%: 26.1 % (ref 14.0–49.7)
MCH: 30 pg (ref 25.1–34.0)
MCHC: 32.3 g/dL (ref 31.5–36.0)
MCV: 93.1 fL (ref 79.5–101.0)
MONO#: 0.2 10e3/uL (ref 0.1–0.9)
MONO%: 7.3 % (ref 0.0–14.0)
NEUT#: 2.1 10e3/uL (ref 1.5–6.5)
NEUT%: 64.5 % (ref 38.4–76.8)
Platelets: 179 10e3/uL (ref 145–400)
RBC: 4.03 10e6/uL (ref 3.70–5.45)
RDW: 14.7 % — ABNORMAL HIGH (ref 11.2–14.5)
WBC: 3.3 10e3/uL — ABNORMAL LOW (ref 3.9–10.3)
lymph#: 0.9 10e3/uL (ref 0.9–3.3)

## 2010-06-17 LAB — COMPREHENSIVE METABOLIC PANEL WITH GFR
ALT: 12 U/L (ref 0–35)
AST: 19 U/L (ref 0–37)
Albumin: 4.2 g/dL (ref 3.5–5.2)
Alkaline Phosphatase: 55 U/L (ref 39–117)
BUN: 11 mg/dL (ref 6–23)
CO2: 22 meq/L (ref 19–32)
Calcium: 8.9 mg/dL (ref 8.4–10.5)
Chloride: 104 meq/L (ref 96–112)
Creatinine, Ser: 0.85 mg/dL (ref 0.40–1.20)
Glucose, Bld: 111 mg/dL — ABNORMAL HIGH (ref 70–99)
Potassium: 4 meq/L (ref 3.5–5.3)
Sodium: 139 meq/L (ref 135–145)
Total Bilirubin: 0.6 mg/dL (ref 0.3–1.2)
Total Protein: 6 g/dL (ref 6.0–8.3)

## 2010-06-24 LAB — CBC WITH DIFFERENTIAL/PLATELET: NEUT%: 74.5 % (ref 38.4–76.8)

## 2010-07-15 ENCOUNTER — Ambulatory Visit: Payer: Self-pay | Admitting: Oncology

## 2010-07-15 ENCOUNTER — Ambulatory Visit (HOSPITAL_COMMUNITY): Admission: RE | Admit: 2010-07-15 | Discharge: 2010-07-15 | Payer: Self-pay | Admitting: Oncology

## 2010-07-15 LAB — CBC WITH DIFFERENTIAL/PLATELET
BASO%: 0.7 % (ref 0.0–2.0)
Basophils Absolute: 0 10*3/uL (ref 0.0–0.1)
EOS%: 1.1 % (ref 0.0–7.0)
Eosinophils Absolute: 0.1 10*3/uL (ref 0.0–0.5)
HCT: 35.7 % (ref 34.8–46.6)
HGB: 12.2 g/dL (ref 11.6–15.9)
LYMPH%: 17.2 % (ref 14.0–49.7)
MCH: 31.8 pg (ref 25.1–34.0)
MCHC: 34 g/dL (ref 31.5–36.0)
MCV: 93.3 fL (ref 79.5–101.0)
MONO#: 0.4 10*3/uL (ref 0.1–0.9)
MONO%: 7.5 % (ref 0.0–14.0)
NEUT#: 3.4 10*3/uL (ref 1.5–6.5)
NEUT%: 73.5 % (ref 38.4–76.8)
Platelets: 173 10*3/uL (ref 145–400)
RBC: 3.83 10*6/uL (ref 3.70–5.45)
RDW: 15.8 % — ABNORMAL HIGH (ref 11.2–14.5)
WBC: 4.7 10*3/uL (ref 3.9–10.3)
lymph#: 0.8 10*3/uL — ABNORMAL LOW (ref 0.9–3.3)

## 2010-11-09 ENCOUNTER — Ambulatory Visit: Payer: Self-pay | Admitting: Oncology

## 2010-12-16 ENCOUNTER — Ambulatory Visit: Payer: Self-pay | Admitting: Oncology

## 2010-12-30 LAB — CBC WITH DIFFERENTIAL/PLATELET
BASO%: 0.3 % (ref 0.0–2.0)
Basophils Absolute: 0 10*3/uL (ref 0.0–0.1)
EOS%: 1.3 % (ref 0.0–7.0)
Eosinophils Absolute: 0.1 10*3/uL (ref 0.0–0.5)
HCT: 39.5 % (ref 34.8–46.6)
HGB: 13.5 g/dL (ref 11.6–15.9)
LYMPH%: 23.4 % (ref 14.0–49.7)
MCH: 31 pg (ref 25.1–34.0)
MCHC: 34.1 g/dL (ref 31.5–36.0)
MCV: 90.9 fL (ref 79.5–101.0)
MONO#: 0.2 10*3/uL (ref 0.1–0.9)
MONO%: 5.6 % (ref 0.0–14.0)
NEUT#: 2.9 10*3/uL (ref 1.5–6.5)
NEUT%: 69.4 % (ref 38.4–76.8)
Platelets: 180 10*3/uL (ref 145–400)
RBC: 4.35 10*6/uL (ref 3.70–5.45)
RDW: 13.7 % (ref 11.2–14.5)
WBC: 4.2 10*3/uL (ref 3.9–10.3)
lymph#: 1 10*3/uL (ref 0.9–3.3)

## 2010-12-31 LAB — COMPREHENSIVE METABOLIC PANEL
ALT: 15 U/L (ref 0–35)
AST: 19 U/L (ref 0–37)
Albumin: 4.4 g/dL (ref 3.5–5.2)
Alkaline Phosphatase: 50 U/L (ref 39–117)
BUN: 12 mg/dL (ref 6–23)
CO2: 26 mEq/L (ref 19–32)
Calcium: 9.1 mg/dL (ref 8.4–10.5)
Chloride: 103 mEq/L (ref 96–112)
Creatinine, Ser: 0.9 mg/dL (ref 0.40–1.20)
Glucose, Bld: 86 mg/dL (ref 70–99)
Potassium: 4 mEq/L (ref 3.5–5.3)
Sodium: 139 mEq/L (ref 135–145)
Total Bilirubin: 0.5 mg/dL (ref 0.3–1.2)
Total Protein: 6.5 g/dL (ref 6.0–8.3)

## 2010-12-31 LAB — VITAMIN D 25 HYDROXY (VIT D DEFICIENCY, FRACTURES): Vit D, 25-Hydroxy: 41 ng/mL (ref 30–89)

## 2010-12-31 LAB — CANCER ANTIGEN 27.29: CA 27.29: 21 U/mL (ref 0–39)

## 2011-02-27 LAB — GLUCOSE, CAPILLARY: Glucose-Capillary: 86 mg/dL (ref 70–99)

## 2011-03-02 LAB — BASIC METABOLIC PANEL
BUN: 1 mg/dL — ABNORMAL LOW (ref 6–23)
BUN: 5 mg/dL — ABNORMAL LOW (ref 6–23)
CO2: 29 mEq/L (ref 19–32)
CO2: 31 mEq/L (ref 19–32)
Calcium: 7.7 mg/dL — ABNORMAL LOW (ref 8.4–10.5)
Calcium: 7.7 mg/dL — ABNORMAL LOW (ref 8.4–10.5)
Calcium: 8 mg/dL — ABNORMAL LOW (ref 8.4–10.5)
Chloride: 103 mEq/L (ref 96–112)
Creatinine, Ser: 0.62 mg/dL (ref 0.4–1.2)
Creatinine, Ser: 0.76 mg/dL (ref 0.4–1.2)
GFR calc non Af Amer: >60 mL/min (ref >60)
Glucose, Bld: 143 mg/dL — ABNORMAL HIGH (ref 70–99)
Glucose, Bld: 92 mg/dL (ref 70–99)
Glucose, Bld: 93 mg/dL (ref 70–99)
Sodium: 137 mEq/L (ref 135–145)

## 2011-03-02 LAB — POCT I-STAT 4, (NA,K, GLUC, HGB,HCT)
Glucose, Bld: 140 mg/dL — ABNORMAL HIGH (ref 70–99)
HCT: 29 % — ABNORMAL LOW (ref 36.0–46.0)
Hemoglobin: 9.9 g/dL — ABNORMAL LOW (ref 12.0–15.0)
Potassium: 3.4 mEq/L — ABNORMAL LOW (ref 3.5–5.1)
Sodium: 136 mEq/L (ref 135–145)

## 2011-03-02 LAB — COMPREHENSIVE METABOLIC PANEL
ALT: 19 U/L (ref 0–35)
Calcium: 9.4 mg/dL (ref 8.4–10.5)
GFR calc Af Amer: >60 mL/min (ref >60)
Glucose, Bld: 86 mg/dL (ref 70–99)
Sodium: 139 mEq/L (ref 135–145)
Total Protein: 7 g/dL (ref 6.0–8.3)

## 2011-03-02 LAB — DIFFERENTIAL
Eosinophils Absolute: 0 10*3/uL (ref 0.0–0.7)
Lymphs Abs: 1.2 10*3/uL (ref 0.7–4.0)
Monocytes Relative: 7 % (ref 3–12)
Neutrophils Relative %: 65 % (ref 43–77)

## 2011-03-02 LAB — CBC
Hemoglobin: 13.8 g/dL (ref 12.0–15.0)
MCHC: 34.3 g/dL (ref 30.0–36.0)
Platelets: 181 10*3/uL (ref 150–400)
RDW: 12.5 % (ref 11.5–15.5)

## 2011-03-02 LAB — URINALYSIS, ROUTINE W REFLEX MICROSCOPIC
Glucose, UA: NEGATIVE mg/dL
Hgb urine dipstick: NEGATIVE
Protein, ur: NEGATIVE mg/dL
Specific Gravity, Urine: <1.005 — ABNORMAL LOW (ref 1.005–1.030)

## 2011-03-02 LAB — HEMOGLOBIN AND HEMATOCRIT, BLOOD: HCT: 22 % — ABNORMAL LOW (ref 36.0–46.0)

## 2011-03-06 LAB — CBC
MCHC: 33.5 g/dL (ref 30.0–36.0)
RBC: 4.08 MIL/uL (ref 3.87–5.11)
RDW: 14.3 % (ref 11.5–15.5)

## 2011-04-26 NOTE — H&P (Signed)
NAMEBREXLEE, HEBERLEIN                 ACCOUNT NO.:  [id number]   MEDICAL RECORD NO.:  [id number]          PATIENT TYPE:  AMB   LOCATION:  SDC                           FACILITY:  [facility]   PHYSICIAN:  Guy Sandifer. Henderson Cloud, M.D. DATE OF BIRTH:  [birth year]   DATE OF ADMISSION:  07/15/2008  DATE OF DISCHARGE:                              HISTORY & PHYSICAL   For admission to [facility], [place], [place]  on July 15, 2008.   CHIEF COMPLAINT:  Cervical dysplasia and history of breast cancer with  estrogen-receptor positive status.   HISTORY OF PRESENT ILLNESS:  This patient is a 45 year old married white  female G2, P2, who has had an estrogen-receptor positive breast cancer  treated with lumpectomy, chemo, and radiation.  This left her  amenorrheic and menopausal for quite a while.  The patient continues on  tamoxifen.  She recently had a documented decrease in her [facility] and a  rising estrogen level.  In view of her breast cancer with ER positive  status, her oncologist recommended BSO.  She also has a history of  cervical dysplasia treated with cryotherapy in the last year.  After  consideration of the options, she is being admitted for laparoscopically-  assisted vaginal hysterectomy with bilateral salpingo-oophorectomy.  Potential risks and complications have been discussed preop.   PAST MEDICAL HISTORY:  1. Breast cancer.  2. Seasonal allergies.  3. Esophageal reflux.   PAST SURGICAL HISTORY:  1. Lumpectomy and lymph node dissection in 2006.  2. Port-A-Cath in-and-out in 2007.   OBSTETRIC HISTORY:  Vaginal delivery x2.   MEDICATIONS:  1. Zyrtec 10 mg daily.  2. Nexium daily.  3. Tamoxifen daily.  4. Ambien p.r.n.  5. Lorazepam p.r.n.   ALLERGIES:  No known drug allergies.   FAMILY HISTORY:  Positive for heart disease.   REVIEW OF SYSTEMS:  NEURO:  Denies headache.  CARDIO:  Denies chest  pain.  PULMONARY:  Denies shortness of breath.  GI:  History of  reflux  as above.   PHYSICAL EXAMINATION:  VITAL SIGNS:  Height 5 feet and 3-1/2 inches,  weight 167 pounds, and blood pressure 110/64.  HEENT:  Without thyromegaly.  LUNGS:  Clear to auscultation.  HEART:  Regular rate and rhythm.  BACK:  Without CVA tenderness.  ABDOMEN:  Soft and nontender without masses.  PELVIC:  Both vagina and cervix without lesion.  Uterus is normal size,  mobile, and nontender.  Adnexa is nontender without masses.  EXTREMITIES:  Grossly within normal limits.  NEUROLOGICAL:  Grossly within normal limits.   ASSESSMENT:  History of estrogen-receptor positive breast cancer and  cervical dysplasia.   PLAN:  Laparoscopically-assisted vaginal hysterectomy and bilateral  salpingo-oophorectomy.      Guy Sandifer Henderson Cloud, M.D.  Electronically Signed     JET/MEDQ  D:  07/08/2008  T:  07/09/2008  Job:  [id number]

## 2011-04-26 NOTE — Op Note (Signed)
NAMEVALORIE, MCGRORY                 ACCOUNT NO.:  [id number]   MEDICAL RECORD NO.:  [id number]          PATIENT TYPE:  OIB   LOCATION:  9320                          FACILITY:  [facility]   PHYSICIAN:  Guy Sandifer. Henderson Cloud, M.D. DATE OF BIRTH:  Oct 30, 1966   DATE OF PROCEDURE:  07/15/2008  DATE OF DISCHARGE:                               OPERATIVE REPORT   PREOPERATIVE DIAGNOSES:  1. Cervical dysplasia.  2. History of breast cancer with positive estrogen receptors.   POSTOPERATIVE DIAGNOSES:  1. Cervical dysplasia.  2. History of breast cancer with positive estrogen receptors.   PROCEDURE:  Laparoscopically-assisted vaginal hysterectomy and bilateral  salpingo-oophorectomy.   SURGEON:  Guy Sandifer. Henderson Cloud, MD   ASSISTANT:  Juluis Mire, MD   ANESTHESIA:  General endotracheal intubation.   SPECIMENS:  Uterus, bilateral tubes, and ovaries to pathology.   ESTIMATED BLOOD LOSS:  400 mL.   INDICATIONS AND CONSENT:  This patient is a 45 year old married white  female G2, P2 with a history of an estrogen receptor-positive breast  cancer.  She has also had cervical dysplasia.  Details are dictated in  history and physical.  Laparoscopically-assisted vaginal hysterectomy  with bilateral salpingo-oophorectomy was discussed preoperatively.  Potential risks and complications were discussed including but limited  to infection, organ damage, bleeding requiring transfusion of blood  products with possible HIV and hepatitis acquisition, DVT, PE,  pneumonia, fistula formation, postoperative pelvic pain and dyspareunia,  and laparotomy.  All questions were answered and consent was signed on  the chart.   FINDINGS:  Upper abdomen is grossly normal.  Uterus is about 6 weeks in  size.  Left ovary contains a 2-cm smooth translucent cyst.  Right ovary  is normal and the anterior and posterior cul-de-sacs were normal.   PROCEDURE:  The patient was taken to operating room where she was  identified, placed  in dorsal supine position, and general anesthesia was  induced via endotracheal intubation.  She was then placed in a dorsal  lithotomy position.  She was prepped abdominally and vaginally.  Bladder  straight catheterized.  Hulka tenaculum was placed in the uterus as a  manipulator and she was draped in a sterile fashion.  The infraumbilical  and suprapubic areas were injected midline with 0.5% plain Marcaine.  Small infraumbilical incision was made.  A disposable Veress needle was  placed on the first attempt without difficulty.  A normal syringe and  drop test were noted.  Gas 2 liters were insufflated under low pressure  with good tympany in the right upper quadrant.  Veress needle was  removed.  A 10-11 Xcel bladeless disposable trocar sleeve was then  placed using direct visualization with a diagnostic laparoscope.  After  placement, the operative laparoscope was placed.  A small suprapubic  incision was made in the midline and a 5-mm Xcel bladeless disposable  trocar sleeve was placed under direct visualization without difficulty.  The above findings were noted.  The course of the ureters was noted to  be well clear at the area of surgery bilaterally.  Then using the EnSeal  cautery cutting device the right infundibulopelvic ligament was taken  down, was carried across the broad ligament, round ligament, and down to  the level of vesicouterine fold.  Good hemostasis was noted.  Similar  procedure was carried out on the left side.  The vesicouterine  peritoneum was taken down cephalad laterally with the scissors.  Suprapubic trocar sleeve was removed.  Pneumoperitoneum was reduced and  attention was turned to vagina.  Posterior cul-de-sacs entered sharply  and the cervix was circumscribed with unipolar cautery.  Mucosa was  advanced sharply and bluntly.  Then using the gyrus bipolar cautery  instrument, the uterosacral ligaments were taken bilaterally followed by  the bladder pillars.   Anterior cul-de-sacs entered without difficulty.  The cardinal ligaments followed by the uterine vessels were taken  bilaterally.  Fundus was delivered posteriorly and the remaining  pedicles were taken down.  All suture would be 0 Monocryl unless  otherwise designated.  Interrupted figure-of-eights were placed at the  3, 4, 8, and 9 o'clock positions bilaterally.  Uterosacral ligaments  were then plicated.  Vaginal cuff with separate sutures were then  plicated in the midline with a third suture.  Cuff was closed with  figure-of-eights.  Foley catheter was placed in the bladder and clear  urine was noted.  Attention was returned to the abdomen where the  pneumoperitoneum was reintroduced and the suprapubic trocar sleeve was  reintroduced under direct visualization.  Numerous bleeders were noted  on the peritoneal edges.  There was also a bleeder noted below the right  round ligament pedicle.  Again attention was paid to the ureters, which  was well clear of the area of cautery bilaterally.  Bipolar cautery was  used to obtain complete hemostasis.  Inspection under reduced  pneumoperitoneum reveals continued hemostasis.  Excess fluid was  removed.  The trocar sleeves were removed.  The umbilical incision was  closed with interrupted 2-0 Vicryl suture and the skin.  Dermabond  placed on both incisions.  All counts were correct.  The patient was  awakened and taken to the recovery room in stable condition.      Guy Sandifer Henderson Cloud, M.D.  Electronically Signed     JET/MEDQ  D:  07/15/2008  T:  07/15/2008  Job:  [id number]

## 2011-05-04 ENCOUNTER — Other Ambulatory Visit: Payer: Self-pay | Admitting: Oncology

## 2011-05-04 ENCOUNTER — Encounter (HOSPITAL_BASED_OUTPATIENT_CLINIC_OR_DEPARTMENT_OTHER): Payer: BC Managed Care – PPO | Admitting: Oncology

## 2011-05-04 DIAGNOSIS — Z170 Estrogen receptor positive status [ER+]: Secondary | ICD-10-CM

## 2011-05-04 DIAGNOSIS — C50319 Malignant neoplasm of lower-inner quadrant of unspecified female breast: Secondary | ICD-10-CM

## 2011-05-04 LAB — CBC WITH DIFFERENTIAL/PLATELET
Eosinophils Absolute: 0.1 10*3/uL (ref 0.0–0.5)
HGB: 13.4 g/dL (ref 11.6–15.9)
MONO#: 0.2 10*3/uL (ref 0.1–0.9)
NEUT#: 3.2 10*3/uL (ref 1.5–6.5)
Platelets: 185 10*3/uL (ref 145–400)
RBC: 4.42 10*6/uL (ref 3.70–5.45)
RDW: 13.6 % (ref 11.2–14.5)
WBC: 4.7 10*3/uL (ref 3.9–10.3)

## 2011-05-05 LAB — COMPREHENSIVE METABOLIC PANEL
Albumin: 4.4 g/dL (ref 3.5–5.2)
CO2: 22 mEq/L (ref 19–32)
Calcium: 9.4 mg/dL (ref 8.4–10.5)
Glucose, Bld: 98 mg/dL (ref 70–99)
Potassium: 3.9 mEq/L (ref 3.5–5.3)
Sodium: 140 mEq/L (ref 135–145)
Total Protein: 6.5 g/dL (ref 6.0–8.3)

## 2011-05-05 LAB — VITAMIN D 25 HYDROXY (VIT D DEFICIENCY, FRACTURES): Vit D, 25-Hydroxy: 39 ng/mL (ref 30–89)

## 2011-05-05 LAB — CANCER ANTIGEN 27.29: CA 27.29: 18 U/mL (ref 0–39)

## 2011-09-09 LAB — CBC
HCT: 40.6
MCHC: 34.2
MCV: 96
Platelets: 139 — ABNORMAL LOW
Platelets: 162
RDW: 12.1
RDW: 12.2
WBC: 5.1

## 2011-09-09 LAB — HCG, SERUM, QUALITATIVE: Preg, Serum: NEGATIVE

## 2011-11-08 ENCOUNTER — Other Ambulatory Visit (HOSPITAL_BASED_OUTPATIENT_CLINIC_OR_DEPARTMENT_OTHER): Payer: BC Managed Care – PPO | Admitting: Lab

## 2011-11-08 ENCOUNTER — Telehealth: Payer: Self-pay | Admitting: Oncology

## 2011-11-08 ENCOUNTER — Ambulatory Visit (HOSPITAL_BASED_OUTPATIENT_CLINIC_OR_DEPARTMENT_OTHER): Payer: BC Managed Care – PPO | Admitting: Oncology

## 2011-11-08 DIAGNOSIS — Z170 Estrogen receptor positive status [ER+]: Secondary | ICD-10-CM

## 2011-11-08 DIAGNOSIS — E559 Vitamin D deficiency, unspecified: Secondary | ICD-10-CM

## 2011-11-08 DIAGNOSIS — C50319 Malignant neoplasm of lower-inner quadrant of unspecified female breast: Secondary | ICD-10-CM

## 2011-11-08 DIAGNOSIS — C50919 Malignant neoplasm of unspecified site of unspecified female breast: Secondary | ICD-10-CM

## 2011-11-08 DIAGNOSIS — Z1231 Encounter for screening mammogram for malignant neoplasm of breast: Secondary | ICD-10-CM

## 2011-11-08 NOTE — Progress Notes (Signed)
Hematology and Oncology Follow Up Visit  Kristin Hoffman [id number] 01/06/1966 45 y.o. 11/08/2011 11:26 AM   Principle Diagnosis: Vital 45 year old woman with history of breast cancer originally diagnosed 2006 status post TAC chemotherapy x6 followed by radiation on tamoxifen followed by ovarian removal. Recurrent you're positive your negative breast cancer status post bilateral mastectomies 01/25/2010 with reconstruction right tissue expander and latissimus dorsi flap on left. Status post one cycle of carboplatin and Gemzar followed by 6 cycles of CMF currently on Aromasin   Interim History:  Kristin Hoffman is doing well. She has been frontal his weight was on weight watchers but ultimately was unable to lose significant amounts. Her husband interestingly enough who started with her has lost about 35 pounds .she stop taking Fosamax as you have been on this for for 5 years. She continues on no medications. She can she tolerates Aromasin. She is on Lexapro. Findings and her depression is well treated that she'd lack some motivation to exercise  She denies any other problems at, pain etc.  Medications: I have reviewed the patient's current medications.  Allergies: Allergies not on file  Past Medical History, Surgical history, Social history, and Family History were reviewed and updated.  Review of Systems: Constitutional:  Negative for fever, chills, night sweats, anorexia, weight loss, pain. Cardiovascular: no chest pain or dyspnea on exertion Respiratory: no cough, shortness of breath, or wheezing Neurological: no TIA or stroke symptoms Dermatological: negative ENT: negative Skin Gastrointestinal: no abdominal pain, change in bowel habits, or black or bloody stools Genito-Urinary: no dysuria, trouble voiding, or hematuria Hematological and Lymphatic: negative Breast: negative for breast lumps Musculoskeletal: negative Remaining ROS negative.  Physical Exam: Blood pressure 123/87, pulse 83,  temperature 98.2 F (36.8 C), temperature source Oral, height 5' 3.58" (1.615 m), weight 202 lb 11.2 oz (91.944 kg). ECOG: 0 General appearance: alert, cooperative and appears stated age Head: Normocephalic, without obvious abnormality, atraumatic Neck: no adenopathy, no carotid bruit, no JVD, supple, symmetrical, trachea midline and thyroid not enlarged, symmetric, no tenderness/mass/nodules Lymph nodes: Cervical, supraclavicular, and axillary nodes normal. Cardiac : Normal heart sounds  Pulmonary:Normal breasts  Breasts:Right and left expander and flap inspection did not reveal any obvious evidence of recurrent  Abdomen:Normal  Extremities Normal  Neuro: Normal  Lab Results: Lab Results  Component Value Date   WBC 4.7 05/04/2011   HGB 13.4 05/04/2011   HCT 39.9 05/04/2011   MCV 90.5 05/04/2011   PLT 185 05/04/2011     Chemistry      Component Value Date/Time   NA 140 05/04/2011 1016   NA 140 05/04/2011 1016   NA 140 05/04/2011 1016   K 3.9 05/04/2011 1016   K 3.9 05/04/2011 1016   K 3.9 05/04/2011 1016   CL 105 05/04/2011 1016   CL 105 05/04/2011 1016   CL 105 05/04/2011 1016   CO2 22 05/04/2011 1016   CO2 22 05/04/2011 1016   CO2 22 05/04/2011 1016   BUN 12 05/04/2011 1016   BUN 12 05/04/2011 1016   BUN 12 05/04/2011 1016   CREATININE 0.82 05/04/2011 1016   CREATININE 0.82 05/04/2011 1016   CREATININE 0.82 05/04/2011 1016      Component Value Date/Time   CALCIUM 9.4 05/04/2011 1016   CALCIUM 9.4 05/04/2011 1016   CALCIUM 9.4 05/04/2011 1016   ALKPHOS 50 05/04/2011 1016   ALKPHOS 50 05/04/2011 1016   ALKPHOS 50 05/04/2011 1016   AST 19 05/04/2011 1016   AST 19 05/04/2011 1016  AST 19 05/04/2011 1016   ALT 14 05/04/2011 1016   ALT 14 05/04/2011 1016   ALT 14 05/04/2011 1016   BILITOT 0.6 05/04/2011 1016   BILITOT 0.6 05/04/2011 1016   BILITOT 0.6 05/04/2011 1016       Radiological Studies: chest X-ray n/a Mammogram n/a Bone density n/a  Impression and Plan: [name] with  history of recurrent breast cancer with new phenotype in contralateral side. Currently on Aromasin. We discussed with weight loss issues. Recommended that she see our counselor to discuss this further. In addition I've tried motivated by using the data that would suggest an increased weight is associated with increased risk of recurrence for breast cancer. I will go ahead and get an MRI scan of her chest to rule out occult disease of her chest wall. I will see her in 6 months time  More than 50% of the visit was spent in patient-related counselling   Pierce Crane, MD 11/27/201211:26 AM

## 2011-11-08 NOTE — Telephone Encounter (Signed)
called Dr. Peter's lmovm with pts info for appt °

## 2011-11-08 NOTE — Telephone Encounter (Signed)
Gv pt appt for [id number].  scheduled pt for mri of the chest for 11/14/2011 @ 9am @ [facility]

## 2011-11-09 ENCOUNTER — Telehealth: Payer: Self-pay | Admitting: *Deleted

## 2011-11-09 NOTE — Telephone Encounter (Signed)
RN left message for patient to return call.  Pt has MRI scheduled and radiology is questioning if patient has expanders in place.  MRI cannot be done if expanders are in.

## 2011-11-10 ENCOUNTER — Telehealth: Payer: Self-pay | Admitting: *Deleted

## 2011-11-10 NOTE — Telephone Encounter (Signed)
RN left message for patient to return call.  Information is needed related to MRI breast--Confirmation needs to be obtained regarding tissue expanders--MRI cannot be performed if expanders are still in place

## 2011-11-14 ENCOUNTER — Ambulatory Visit (HOSPITAL_COMMUNITY)
Admission: RE | Admit: 2011-11-14 | Discharge: 2011-11-14 | Disposition: A | Discharge status: Home / Self Care | Payer: BC Managed Care – PPO | Source: Ambulatory Visit | Attending: Oncology | Admitting: Oncology

## 2011-11-14 ENCOUNTER — Other Ambulatory Visit: Payer: Self-pay | Admitting: Oncology

## 2011-11-14 DIAGNOSIS — D689 Coagulation defect, unspecified: Secondary | ICD-10-CM | POA: Insufficient documentation

## 2011-11-14 DIAGNOSIS — C50919 Malignant neoplasm of unspecified site of unspecified female breast: Secondary | ICD-10-CM

## 2011-11-14 DIAGNOSIS — E559 Vitamin D deficiency, unspecified: Secondary | ICD-10-CM

## 2011-11-14 DIAGNOSIS — Z9010 Acquired absence of unspecified breast and nipple: Secondary | ICD-10-CM | POA: Insufficient documentation

## 2011-11-14 DIAGNOSIS — Z1231 Encounter for screening mammogram for malignant neoplasm of breast: Secondary | ICD-10-CM

## 2011-11-14 MED ORDER — GADOBENATE DIMEGLUMINE 529 MG/ML IV SOLN
20.0000 mL | Freq: Once | INTRAVENOUS | Status: AC | PRN
  Administered 2011-11-14: 20 mL via INTRAVENOUS

## 2012-05-01 ENCOUNTER — Other Ambulatory Visit: Payer: BC Managed Care – PPO | Admitting: Lab

## 2012-05-08 ENCOUNTER — Ambulatory Visit: Payer: BC Managed Care – PPO | Admitting: Oncology

## 2012-06-23 ENCOUNTER — Other Ambulatory Visit: Payer: Self-pay | Admitting: Oncology

## 2012-06-23 DIAGNOSIS — C50919 Malignant neoplasm of unspecified site of unspecified female breast: Secondary | ICD-10-CM

## 2012-07-24 ENCOUNTER — Other Ambulatory Visit: Payer: Self-pay | Admitting: Oncology

## 2012-07-24 DIAGNOSIS — E559 Vitamin D deficiency, unspecified: Secondary | ICD-10-CM

## 2012-07-24 DIAGNOSIS — C50919 Malignant neoplasm of unspecified site of unspecified female breast: Secondary | ICD-10-CM

## 2012-07-26 ENCOUNTER — Telehealth: Payer: Self-pay | Admitting: *Deleted

## 2012-07-26 NOTE — Telephone Encounter (Signed)
Patient confirmed over the phone the new date and time of the lab and NP

## 2012-08-06 ENCOUNTER — Encounter: Payer: Self-pay | Admitting: Family

## 2012-08-06 ENCOUNTER — Ambulatory Visit (HOSPITAL_BASED_OUTPATIENT_CLINIC_OR_DEPARTMENT_OTHER): Payer: BC Managed Care – PPO | Admitting: Family

## 2012-08-06 ENCOUNTER — Other Ambulatory Visit (HOSPITAL_BASED_OUTPATIENT_CLINIC_OR_DEPARTMENT_OTHER): Payer: BC Managed Care – PPO | Admitting: Lab

## 2012-08-06 ENCOUNTER — Telehealth: Payer: Self-pay | Admitting: Oncology

## 2012-08-06 VITALS — BP 138/82 | HR 76 | Temp 98.5°F | Resp 20 | Ht 63.58 in | Wt 189.6 lb

## 2012-08-06 DIAGNOSIS — E559 Vitamin D deficiency, unspecified: Secondary | ICD-10-CM

## 2012-08-06 DIAGNOSIS — C50919 Malignant neoplasm of unspecified site of unspecified female breast: Secondary | ICD-10-CM

## 2012-08-06 DIAGNOSIS — Z1231 Encounter for screening mammogram for malignant neoplasm of breast: Secondary | ICD-10-CM

## 2012-08-06 DIAGNOSIS — Z853 Personal history of malignant neoplasm of breast: Secondary | ICD-10-CM | POA: Insufficient documentation

## 2012-08-06 DIAGNOSIS — C50319 Malignant neoplasm of lower-inner quadrant of unspecified female breast: Secondary | ICD-10-CM

## 2012-08-06 DIAGNOSIS — Z170 Estrogen receptor positive status [ER+]: Secondary | ICD-10-CM

## 2012-08-06 LAB — COMPREHENSIVE METABOLIC PANEL (CC13)
Albumin: 4.1 g/dL (ref 3.5–5.0)
Alkaline Phosphatase: 72 U/L (ref 40–150)
BUN: 11 mg/dL (ref 7.0–26.0)
Creatinine: 1 mg/dL (ref 0.6–1.1)
Glucose: 93 mg/dl (ref 70–99)
Potassium: 3.7 mEq/L (ref 3.5–5.1)
Total Bilirubin: 1.4 mg/dL — ABNORMAL HIGH (ref 0.20–1.20)

## 2012-08-06 LAB — CBC WITH DIFFERENTIAL/PLATELET
BASO%: 0.8 % (ref 0.0–2.0)
EOS%: 1.6 % (ref 0.0–7.0)
HCT: 42.6 % (ref 34.8–46.6)
LYMPH%: 29.4 % (ref 14.0–49.7)
MCH: 30.7 pg (ref 25.1–34.0)
MCHC: 33.8 g/dL (ref 31.5–36.0)
MCV: 91 fL (ref 79.5–101.0)
MONO%: 7.3 % (ref 0.0–14.0)
NEUT%: 60.9 % (ref 38.4–76.8)
Platelets: 168 10*3/uL (ref 145–400)
RBC: 4.68 10*6/uL (ref 3.70–5.45)

## 2012-08-06 MED ORDER — EXEMESTANE 25 MG PO TABS: 25.0000 mg | ORAL_TABLET | Freq: Every day | ORAL | Status: DC

## 2012-08-06 NOTE — Patient Instructions (Signed)
Return in 6 months to see Dr. Donnie Coffin. Remain on Aromasin.

## 2012-08-06 NOTE — Progress Notes (Signed)
Hematology and Oncology Follow Up Visit  EMI LYMON [id number] [birth year] 46 y.o. 08/06/2012 1:01 PM   Principle Diagnosis: Vital 46 year old woman with history of breast cancer originally diagnosed 2006 status post TAC chemotherapy x6 followed by radiation on tamoxifen followed by ovarian removal. Recurrent ER positive PR negative breast cancer status post bilateral mastectomies 01/25/2010 with reconstruction with right tissue expander and latissimus dorsi flap on left. Status post one cycle of carboplatin and Gemzar followed by 6 cycles of CMF currently on Aromasin   Interim History:  Doing well, continues to battle weight. Also reports mild depression, now on Lexapro with PRN Ativan. Has difficulty sleeping, used Ativan. Tolerates Aromasin with no reported side effects. Has transient left side pain, site of previous drain, also the side of the lat flap. She reports it lasts only a few seconds. Does not require pain medication. No headache or blurred vision. No cough or shortness of breath. No abdominal pain or new bone pain. Bowel and bladder function are normal. Appetite is good, with adequate fluid intake. Remainder of the 10 point  review of systems is negative.  She inquires about the need for mammograms. With bilateral mastectomies, the benefit of screening mammography would be minimal. I stress that it needs to be her decision to forego future mammograms.    Medications: I have reviewed the patient's current medications.  Allergies: No Known Allergies  Physical Exam: Blood pressure 138/82, pulse 76, temperature 98.5 F (36.9 C), resp. rate 20, height 5' 3.58" (1.615 m), weight 189 lb 9.6 oz (86.002 kg). ECOG: 0 General appearance: alert, cooperative and appears stated age Head: Normocephalic, without obvious abnormality, atraumatic Neck: no adenopathy, no carotid bruit, no JVD, supple, symmetrical, trachea midline and thyroid not enlarged, symmetric, no tenderness/mass/nodules Lymph  nodes: Cervical, supraclavicular, and axillary nodes normal. Cardiac : Normal heart sounds  Pulmonary:Normal breasts  Abdomen:Normal  Extremities Normal  Neuro: Normal BREAST EXAM: In the supine position, with the right arm over the head, the right breast is absent, reconstructed implant. No nipple reconstruction. Incisions are well healed with no nodularity. No right axillary adenopathy. With the left arm over the head, the left breast is absent, reconstructed with latissimus flap. Corresponding scar, left subscapular back. No nipple reconstruction. Incisions are well-healed with no nodularity. No redness of the skin. No left axillary adenopathy.    Lab Results: Lab Results  Component Value Date   WBC 4.2 08/06/2012   HGB 14.4 08/06/2012   HCT 42.6 08/06/2012   MCV 91.0 08/06/2012   PLT 168 08/06/2012    Impression: 46 y/o female with: 1. History of recurrent breast cancer with new phenotype in contralateral side. Bilateral mastectomy with reconstruction.  2. Currently on Aromasin with good tolerance.   Plan: 1. Return in 6 months with Dr. Donnie Coffin with lab prior.  2. Remain on Aromasin.   Colman Cater, FNP 8/26/20131:01 PM

## 2012-08-06 NOTE — Telephone Encounter (Signed)
gve the pt her feb 2014 appt calendar °

## 2012-08-11 ENCOUNTER — Other Ambulatory Visit: Payer: Self-pay | Admitting: Oncology

## 2012-11-08 ENCOUNTER — Inpatient Hospital Stay (HOSPITAL_BASED_OUTPATIENT_CLINIC_OR_DEPARTMENT_OTHER)
Admission: EM | Admit: 2012-11-08 | Discharge: 2012-11-12 | DRG: 014 | Disposition: A | Discharge status: Home / Self Care | Payer: BC Managed Care – PPO | Attending: Internal Medicine | Admitting: Internal Medicine

## 2012-11-08 ENCOUNTER — Emergency Department (HOSPITAL_BASED_OUTPATIENT_CLINIC_OR_DEPARTMENT_OTHER): Payer: BC Managed Care – PPO

## 2012-11-08 ENCOUNTER — Encounter (HOSPITAL_BASED_OUTPATIENT_CLINIC_OR_DEPARTMENT_OTHER): Payer: Self-pay | Admitting: *Deleted

## 2012-11-08 DIAGNOSIS — E559 Vitamin D deficiency, unspecified: Secondary | ICD-10-CM

## 2012-11-08 DIAGNOSIS — Z1231 Encounter for screening mammogram for malignant neoplasm of breast: Secondary | ICD-10-CM

## 2012-11-08 DIAGNOSIS — E785 Hyperlipidemia, unspecified: Secondary | ICD-10-CM | POA: Diagnosis present

## 2012-11-08 DIAGNOSIS — F329 Major depressive disorder, single episode, unspecified: Secondary | ICD-10-CM | POA: Diagnosis present

## 2012-11-08 DIAGNOSIS — Z79899 Other long term (current) drug therapy: Secondary | ICD-10-CM

## 2012-11-08 DIAGNOSIS — R51 Headache: Secondary | ICD-10-CM | POA: Diagnosis present

## 2012-11-08 DIAGNOSIS — F3289 Other specified depressive episodes: Secondary | ICD-10-CM | POA: Diagnosis present

## 2012-11-08 DIAGNOSIS — C50919 Malignant neoplasm of unspecified site of unspecified female breast: Secondary | ICD-10-CM

## 2012-11-08 DIAGNOSIS — I6340 Cerebral infarction due to embolism of unspecified cerebral artery: Principal | ICD-10-CM | POA: Diagnosis present

## 2012-11-08 DIAGNOSIS — I639 Cerebral infarction, unspecified: Secondary | ICD-10-CM

## 2012-11-08 DIAGNOSIS — Z853 Personal history of malignant neoplasm of breast: Secondary | ICD-10-CM

## 2012-11-08 DIAGNOSIS — F411 Generalized anxiety disorder: Secondary | ICD-10-CM | POA: Diagnosis present

## 2012-11-08 HISTORY — DX: Reserved for inherently not codable concepts without codable children: IMO0001

## 2012-11-08 HISTORY — DX: Gastro-esophageal reflux disease without esophagitis: K21.9

## 2012-11-08 LAB — COMPREHENSIVE METABOLIC PANEL
ALT: 17 U/L (ref 0–35)
AST: 24 U/L (ref 0–37)
Albumin: 4.3 g/dL (ref 3.5–5.2)
Chloride: 101 mEq/L (ref 96–112)
Creatinine, Ser: 1 mg/dL (ref 0.50–1.10)
Potassium: 4 mEq/L (ref 3.5–5.1)
Sodium: 141 mEq/L (ref 135–145)
Total Bilirubin: 0.7 mg/dL (ref 0.3–1.2)

## 2012-11-08 LAB — DIFFERENTIAL
Basophils Absolute: 0 10*3/uL (ref 0.0–0.1)
Basophils Relative: 0 % (ref 0–1)
Monocytes Absolute: 0.4 10*3/uL (ref 0.1–1.0)
Neutro Abs: 3.8 10*3/uL (ref 1.7–7.7)
Neutrophils Relative %: 64 % (ref 43–77)

## 2012-11-08 LAB — CBC
MCHC: 33.1 g/dL (ref 30.0–36.0)
Platelets: 205 10*3/uL (ref 150–400)
RDW: 12.7 % (ref 11.5–15.5)
WBC: 5.9 10*3/uL (ref 4.0–10.5)

## 2012-11-08 LAB — RAPID URINE DRUG SCREEN, HOSP PERFORMED
Barbiturates: NOT DETECTED
Cocaine: NOT DETECTED
Tetrahydrocannabinol: NOT DETECTED

## 2012-11-08 LAB — PROTIME-INR
INR: 1.02 (ref 0.00–1.49)
Prothrombin Time: 13.3 seconds (ref 11.6–15.2)

## 2012-11-08 LAB — LIPID PANEL
Cholesterol: 170 mg/dL (ref 0–200)
LDL Cholesterol: 101 mg/dL — ABNORMAL HIGH (ref 0–99)
VLDL: 24 mg/dL (ref 0–40)

## 2012-11-08 LAB — HEMOGLOBIN A1C: Mean Plasma Glucose: 114 mg/dL (ref <117)

## 2012-11-08 LAB — APTT: aPTT: 29 seconds (ref 24–37)

## 2012-11-08 MED ORDER — ASPIRIN 325 MG PO TABS
325.0000 mg | ORAL_TABLET | Freq: Every day | ORAL | Status: DC
Start: 2012-11-08 — End: 2012-11-09
  Administered 2012-11-08: 325 mg via ORAL
  Filled 2012-11-08: qty 1

## 2012-11-08 MED ORDER — ACETAMINOPHEN 325 MG PO TABS
650.0000 mg | ORAL_TABLET | ORAL | Status: DC | PRN
  Administered 2012-11-08 – 2012-11-09 (×3): 650 mg via ORAL
  Filled 2012-11-08 (×3): qty 2

## 2012-11-08 NOTE — ED Notes (Signed)
[facility] arrived to transport pt to [facility] CDU

## 2012-11-08 NOTE — ED Notes (Signed)
Patient transported to CT 

## 2012-11-08 NOTE — ED Notes (Signed)
Pt asked if she could eat/ Spoke with Dr. Rhunette Croft Informed that patient passed Stroke Swallow Screen/ Dr. Rhunette Croft stated pt could eat

## 2012-11-08 NOTE — ED Notes (Signed)
Report given to Paula with [facility]  

## 2012-11-08 NOTE — ED Notes (Signed)
Pt transferred by [facility] / pt denies blurred vision, weakness, sob/ pt ambulated w/o difficulty from the ambulance stretcher to CDU bed. Pt c/o of headache at a level 7.

## 2012-11-08 NOTE — ED Provider Notes (Signed)
Patient with a hx sig for breast CA & HTN presented to [facility] with c/o of aphasia lasting about 30 min. Pt was placed in CDU on TIA protocol by Dr. Lynelle Doctor, with Pod B/D accepting transfer of care. Wk up thus far included neg CT head and nl labs. Pt arrived asymptomatic without any acute complaints. Patient re-evaluated and is resting comfortable, BP 149/88  Pulse 74  Temp 98.2 F (36.8 C) (Oral)  Resp 18  Ht 5\' 4"  (1.626 m)  Wt 190 lb (86.183 kg)  BMI 32.61 kg/m2  SpO2 98%  Plan per previous provider is to dispo pending results of protocol wk up. On exam: hemodynamically stable, NAD, heart w/ RRR, lungs CTAB, Chest & abd non-tender, no peripheral edema or calf tenderness.  Care: Pod B/D (Dr. Lynelle Doctor [place]) Pending: MRI/A, 2D Echo, Carotid doppler    Jaci Carrel, PA-C 11/08/12 2209

## 2012-11-08 NOTE — ED Notes (Signed)
MD with pt  

## 2012-11-08 NOTE — ED Provider Notes (Signed)
History    CSN: [id number] Arrival date & time 11/08/12  1636 First MD Initiated Contact with Patient 11/08/12 1727      Chief Complaint  Patient presents with  . Dizziness  . Aphasia  . Headache    HPI The patient was moving around some [employer].  She started to feel "dizzy" and had an odd sensation like she was out of it.  She then went into the kitchen and had trouble speaking.  She is not sure if she actually said anything or if her mother did not understand her but her mother did not respond.  She felt like she would try to think something but it would not come out.  Her husband heard her speak. She was saying.  "I don't know"  "I don't understand".  The speech was not slurred and she was not having trouble forming words but the patient did not feel like she could find the right words.  At times she seemed to be saying extra words.  Those symptoms have resolved at this time.  She has been normal for at least 30 minutes. Overall, it lasted for approx 60 minutes before it resolved.  She did have a headache and her blood pressure was elevated.   No prior history of same.   Past Medical History  Diagnosis Date  . Breast cancer   . Depression   . Reflux     Past Surgical History  Procedure Date  . Mastectomy w/ nodes partial   . Mastectomy   . Breast reconstruction   . Portacath placement     x 2  . Portacath removal     x 2  . Abdominal hysterectomy     No family history on file.  History  Substance Use Topics  . Smoking status: Never Smoker   . Smokeless tobacco: Never Used  . Alcohol Use: Yes     Comment: rarely    OB History    Grav Para Term Preterm Abortions TAB SAB Ect Mult Living                  Review of Systems  All other systems reviewed and are negative.    Allergies  Review of patient's allergies indicates no known allergies.  Home Medications   Current Outpatient Rx  Name  Route  Sig  Dispense  Refill  . BIOTIN 1000 MCG PO  TABS   Oral   Take 1,000 mcg by mouth daily.           Marland Kitchen CETIRIZINE HCL 10 MG PO TABS   Oral   Take 10 mg by mouth daily.           Marland Kitchen VITAMIN D 1000 UNITS PO TABS   Oral   Take 1,000 Units by mouth daily.           Marland Kitchen ESCITALOPRAM OXALATE 20 MG PO TABS   Oral   Take 20 mg by mouth daily.           Marland Kitchen ESOMEPRAZOLE MAGNESIUM 40 MG PO CPDR   Oral   Take 40 mg by mouth daily before breakfast.           . EXEMESTANE 25 MG PO TABS   Oral   Take 1 tablet (25 mg total) by mouth daily after breakfast.   30 tablet   5   . LORAZEPAM 1 MG PO TABS   Oral   Take 1 mg by mouth at bedtime  as needed.           . MULTI-VITAMIN/MINERALS PO TABS   Oral   Take 1 tablet by mouth daily.           Marland Kitchen ZOLPIDEM TARTRATE 10 MG PO TABS   Oral   Take 10 mg by mouth at bedtime as needed.             BP 159/88  Pulse 77  Resp 20  Ht 5\' 4"  (1.626 m)  Wt 190 lb (86.183 kg)  BMI 32.61 kg/m2  SpO2 98%  Physical Exam  Nursing note and vitals reviewed. Constitutional: She is oriented to person, place, and time. She appears well-developed and well-nourished. No distress.  HENT:  Head: Normocephalic and atraumatic.  Right Ear: External ear normal.  Left Ear: External ear normal.  Mouth/Throat: Oropharynx is clear and moist.  Eyes: Conjunctivae normal are normal. Right eye exhibits no discharge. Left eye exhibits no discharge. No scleral icterus.  Neck: Neck supple. No tracheal deviation present.  Cardiovascular: Normal rate, regular rhythm and intact distal pulses.   Murmur heard.  Systolic murmur is present with a grade of 2/6   No diastolic murmur is present  Pulmonary/Chest: Effort normal and breath sounds normal. No stridor. No respiratory distress. She has no wheezes. She has no rales.  Abdominal: Soft. Bowel sounds are normal. She exhibits no distension. There is no tenderness. There is no rebound and no guarding.  Musculoskeletal: She exhibits no edema and no tenderness.    Neurological: She is alert and oriented to person, place, and time. She has normal strength. No cranial nerve deficit ( no gross defecits noted) or sensory deficit. She exhibits normal muscle tone. She displays no seizure activity. Coordination normal.       No pronator drift bilateral upper extrem, able to hold both legs off bed for 5 seconds, sensation intact in all extremities, no visual field cuts, no left or right sided neglect  Skin: Skin is warm and dry. No rash noted.  Psychiatric: She has a normal mood and affect.    ED Course  Procedures (including critical care time)  Rate: 85  Rhythm: normal sinus rhythm  QRS Axis: normal  Intervals: prolonged qt  ST/T Wave abnormalities: normal  Conduction Disutrbances:none  Narrative Interpretation: prolonged qt  Old EKG Reviewed: none available  Labs Reviewed  COMPREHENSIVE METABOLIC PANEL - Abnormal; Notable for the following:    Glucose, Bld 113 (*)     GFR calc non Af Amer 67 (*)     GFR calc Af Amer 78 (*)     All other components within normal limits  PROTIME-INR  APTT  CBC  DIFFERENTIAL  URINE RAPID DRUG SCREEN (HOSP PERFORMED)  TROPONIN I  CBC  COMPREHENSIVE METABOLIC PANEL  URINALYSIS, ROUTINE W REFLEX MICROSCOPIC  LIPID PANEL  HEMOGLOBIN A1C   Ct Head Wo Contrast  11/08/2012  *RADIOLOGY REPORT*  Clinical Data: Headache, aphasia, dizziness  CT HEAD WITHOUT CONTRAST  Technique:  Contiguous axial images were obtained from the base of the skull through the vertex without contrast.  Comparison: None.  Findings: No acute hemorrhage, acute infarction, or mass lesion is identified.  No midline shift.  No ventriculomegaly.  No skull fracture.  Orbits and paranasal sinuses are unremarkable.  IMPRESSION: No acute intracranial finding.   Original Report Authenticated By: Christiana Pellant, M.D.     1. TIA (transient ischemic attack)     MDM  Her symptoms are concerning for possible TIA.  ABCD score of 3 (or 4 for time).   Discussed findings with patient and husband.  Will transfer to [facility] for TIA protocol in the CDU.  Discussed with Dr Glennis Brink and PA Andria Meuse, MD 11/08/12 [phone]

## 2012-11-08 NOTE — ED Notes (Signed)
Report to [facility] in CDU at [facility].

## 2012-11-08 NOTE — ED Notes (Signed)
Patient states she was pushing boxes of christmas decorations around and began to feel dizzy and "weird".  Went into the kitchen to speak with her mother and had problems getting her words to come out.  Husband had pt lay down and took her bp which was 175/115.  States he felt like she was slurring her words.  Now pt c/o headache.  Husband feels like she is not back to her baseline, pt states she feels clearer headed at this time.

## 2012-11-09 ENCOUNTER — Inpatient Hospital Stay (HOSPITAL_COMMUNITY): Payer: BC Managed Care – PPO

## 2012-11-09 ENCOUNTER — Observation Stay (HOSPITAL_COMMUNITY): Payer: BC Managed Care – PPO

## 2012-11-09 DIAGNOSIS — G459 Transient cerebral ischemic attack, unspecified: Secondary | ICD-10-CM

## 2012-11-09 DIAGNOSIS — I6350 Cerebral infarction due to unspecified occlusion or stenosis of unspecified cerebral artery: Secondary | ICD-10-CM

## 2012-11-09 DIAGNOSIS — F329 Major depressive disorder, single episode, unspecified: Secondary | ICD-10-CM

## 2012-11-09 LAB — URINALYSIS, ROUTINE W REFLEX MICROSCOPIC
Bilirubin Urine: NEGATIVE
Hgb urine dipstick: NEGATIVE
Nitrite: NEGATIVE
Protein, ur: NEGATIVE mg/dL
Urobilinogen, UA: 1 mg/dL (ref 0.0–1.0)

## 2012-11-09 LAB — URINE MICROSCOPIC-ADD ON

## 2012-11-09 LAB — ANTITHROMBIN III: AntiThromb III Func: 99 % (ref 75–120)

## 2012-11-09 MED ORDER — ATORVASTATIN CALCIUM 40 MG PO TABS
40.0000 mg | ORAL_TABLET | Freq: Every day | ORAL | Status: DC
  Administered 2012-11-09 – 2012-11-12 (×4): 40 mg via ORAL
  Filled 2012-11-09 (×5): qty 1

## 2012-11-09 MED ORDER — ASPIRIN EC 325 MG PO TBEC
325.0000 mg | DELAYED_RELEASE_TABLET | Freq: Every day | ORAL | Status: DC
  Administered 2012-11-09 – 2012-11-12 (×4): 325 mg via ORAL
  Filled 2012-11-09 (×4): qty 1

## 2012-11-09 MED ORDER — PANTOPRAZOLE SODIUM 40 MG PO TBEC
40.0000 mg | DELAYED_RELEASE_TABLET | Freq: Every day | ORAL | Status: DC
  Administered 2012-11-09 – 2012-11-12 (×4): 40 mg via ORAL
  Filled 2012-11-09 (×4): qty 1

## 2012-11-09 MED ORDER — ESCITALOPRAM OXALATE 20 MG PO TABS
20.0000 mg | ORAL_TABLET | Freq: Every day | ORAL | Status: DC
  Administered 2012-11-09 – 2012-11-12 (×4): 20 mg via ORAL
  Filled 2012-11-09 (×4): qty 1

## 2012-11-09 MED ORDER — SODIUM CHLORIDE 0.9 % IJ SOLN
3.0000 mL | Freq: Two times a day (BID) | INTRAMUSCULAR | Status: DC
  Administered 2012-11-09 – 2012-11-12 (×6): 3 mL via INTRAVENOUS

## 2012-11-09 MED ORDER — LORATADINE 10 MG PO TABS
10.0000 mg | ORAL_TABLET | Freq: Every day | ORAL | Status: DC
  Administered 2012-11-09 – 2012-11-10 (×2): 10 mg via ORAL
  Filled 2012-11-09 (×3): qty 1

## 2012-11-09 MED ORDER — ZOLPIDEM TARTRATE 5 MG PO TABS
5.0000 mg | ORAL_TABLET | Freq: Every evening | ORAL | Status: DC | PRN
  Administered 2012-11-09 – 2012-11-11 (×3): 5 mg via ORAL
  Filled 2012-11-09 (×3): qty 1

## 2012-11-09 MED ORDER — ATORVASTATIN CALCIUM 20 MG PO TABS
20.0000 mg | ORAL_TABLET | Freq: Every day | ORAL | Status: DC
  Filled 2012-11-09: qty 1

## 2012-11-09 NOTE — ED Provider Notes (Signed)
Medical screening examination/treatment/procedure(s) were conducted as a shared visit with non-physician practitioner(s) and myself.  I personally evaluated the patient during the encounter  Derwood Kaplan, MD 11/09/12 0110

## 2012-11-09 NOTE — ED Notes (Signed)
Pt transported to have 2D Echocardiogram w/o contrast

## 2012-11-09 NOTE — Consult Note (Signed)
Reason for Consult:Stroke Referring Physician: Ardeen Jourdain  CC: aphasia  History is obtained from:Patient, husband  HPI: Kristin Hoffman is a 46 y.o. female who had symptoms that started yesterday around 4 pm. She states that she felt strange, like she was not all there and went to tell her husband, but was unable to get her words out. She could understand, but her words would come out "jumbled". By the time she got to the ER, her symptoms were improving and she was treated as a TIA on a TIA protocol through the ED. She had an MRI done as part of this workup and it shows two small cortical infarcts in teh posterior left MCA distribution.    LSN: 4pm tpa given: no rapidly resolving.  NIHSS: 0   ROS: A 14 point ROS was performed and is negative except as noted in the HPI.  Past Medical History  Diagnosis Date  . Breast cancer   . Depression   . Reflux     Family History: Unknown - adopted(does have some contact with birth father though)  Social History: Tob: denies  Exam: Current vital signs: BP 139/88  Pulse 71  Temp 98.6 F (37 C) (Oral)  Resp 13  Ht 5\' 4"  (1.626 m)  Wt 86.183 kg (190 lb)  BMI 32.61 kg/m2  SpO2 99% Vital signs in last 24 hours: Temp:  [98.2 F (36.8 C)-99 F (37.2 C)] 98.6 F (37 C) (11/29 0017) Pulse Rate:  [65-80] 71  (11/29 0842) Resp:  [13-23] 13  (11/29 0842) BP: (120-162)/(75-97) 139/88 mmHg (11/29 0842) SpO2:  [96 %-100 %] 99 % (11/29 0842) Weight:  [86.183 kg (190 lb)] 86.183 kg (190 lb) (11/28 1659)  General: In bed, NAD CV: RRR Mental Status: Patient is awake, alert, oriented to person, place, month, year, and situation. Immediate and remote memory are intact. Patient is able to give a clear and coherent history. Able to read and able to name objects Able to repeat without difficulty.  Cranial Nerves: II: Visual Fields are full. Pupils are equal, round, and reactive to light.  Discs are difficult to visualize. III,IV, VI: EOMI  without ptosis or diploplia.  V: Facial sensation is symmetric to temperature VII: Facial movement is symmetric.  VIII: hearing is intact to voice X: Uvula elevates symmetrically XI: Shoulder shrug is symmetric. XII: tongue is midline without atrophy or fasciculations.  Motor: Tone is normal. Bulk is normal. 5/5 strength was present in all four extremities.  Sensory: Sensation is symmetric to light touch and temperature in the arms and legs. Deep Tendon Reflexes: 2+ and symmetric in the biceps and patellae.  Cerebellar: FNF  intact bilaterally Gait: Patient has a stable casual gait. Able to tandem, heel, and toe walk.   I have reviewed labs in epic and the results pertinent to this consultation are: LDL 101 a1c 5.6  I have reviewed the images obtained:MRI brain - two small infarcts in the posterior division of the MCA.   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 There is a tiny mount of blood associated with one of the infarcts, but I would not hold antiplatelet therapy for this.   Recommendations: 1) PT, OT 2) ASA 325mg  3) start statin for LDL > 100 4)  A1C 5.6 5)Echo is pending, If echo is negative, will need TEE. Unable to perform today due to holiday schedule.  6) If workup is negative, will likely need outpatient holter for at  least several weeks.  7) aromasin does have thromboembolism listed as a possible but rare side effect. If no other cause is identified, would discuss possibility with her oncologist.  8) Hypercoag panel.   Ritta Slot, MD [facility] [phone]  If 7pm- 7am, please page neurology on call at [phone].

## 2012-11-09 NOTE — ED Notes (Signed)
Patient ambulated to restroom. Patient tolerated well.

## 2012-11-09 NOTE — ED Notes (Signed)
Pt is back in room from MRI.

## 2012-11-09 NOTE — Progress Notes (Addendum)
*  PRELIMINARY RESULTS* Vascular Ultrasound Carotid Duplex (Doppler) has been completed.  Preliminary findings: Right= <40% ICA stenosis with antegrade vertebral flow. Left = Proximal ICA demonstrates slightly increased velocities into the 40-59% range, however this most likely due to tortuosity of the vessel as there is no plaque formation to suggest stenosis. Antegrade vertebral flow.  Farrel Demark, RDMS, RVT  11/09/2012, 7:55 AM

## 2012-11-09 NOTE — Evaluation (Signed)
Physical Therapy Evaluation Patient Details Name: Kristin Hoffman MRN: [id number] DOB: 06-Feb-1966 Today's Date: 11/09/2012 Time: 6578-4696 PT Time Calculation (min): 13 min  PT Assessment / Plan / Recommendation Clinical Impression  pt rpesents with TIA.  pt Independent with all mobility.  No PT needs at this time.  Will sign off.      PT Assessment  Patent does not need any further PT services    Follow Up Recommendations  No PT follow up    Does the patient have the potential to tolerate intense rehabilitation      Barriers to Discharge        Equipment Recommendations  None recommended by PT    Recommendations for Other Services     Frequency      Precautions / Restrictions Precautions Precautions: None Restrictions Weight Bearing Restrictions: No   Pertinent Vitals/Pain Denies pain.      Mobility  Bed Mobility Bed Mobility: Supine to Sit;Sitting - Scoot to Edge of Bed Supine to Sit: 7: Independent Sitting - Scoot to Edge of Bed: 7: Independent Transfers Transfers: Sit to Stand;Stand to Sit Sit to Stand: 7: Independent;With upper extremity assist;From bed Stand to Sit: 7: Independent;With upper extremity assist;To chair/3-in-1 Ambulation/Gait Ambulation/Gait Assistance: 7: Independent Ambulation Distance (Feet): 400 Feet Assistive device: None Ambulation/Gait Assistance Details: Demos good safe ambulation.   Gait Pattern: Within Functional Limits Stairs: Yes Stairs Assistance: 6: Modified independent (Device/Increase time) Stair Management Technique: One rail Right;Forwards Number of Stairs: 2  Wheelchair Mobility Wheelchair Mobility: No Modified Rankin (Stroke Patients Only) Pre-Morbid Rankin Score: No symptoms Modified Rankin: No symptoms    Shoulder Instructions     Exercises     PT Diagnosis:    PT Problem List:   PT Treatment Interventions:     PT Goals    Visit Information  Last PT Received On: 11/09/12 Assistance Needed: +1      Subjective Data  Subjective: I'm feeling better today.   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) Communication Communication: No difficulties    Cognition  Overall Cognitive Status: Appears within functional limits for tasks assessed/performed Arousal/Alertness: Awake/alert Orientation Level: Appears intact for tasks assessed Behavior During Session: [facility] for tasks performed    Extremity/Trunk Assessment Right Upper Extremity Assessment RUE ROM/Strength/Tone: WFL for tasks assessed RUE Sensation: WFL - Light Touch Left Upper Extremity Assessment LUE ROM/Strength/Tone: WFL for tasks assessed LUE Sensation: WFL - Light Touch Right Lower Extremity Assessment RLE ROM/Strength/Tone: WFL for tasks assessed RLE Sensation: WFL - Light Touch Left Lower Extremity Assessment LLE ROM/Strength/Tone: WFL for tasks assessed LLE Sensation: WFL - Light Touch Trunk Assessment Trunk Assessment: Normal   Balance Balance Balance Assessed: Yes High Level Balance High Level Balance Activites: Direction changes;Turns;Head turns;Sudden stops High Level Balance Comments: Had pt perform cognitive tasks and memory tasks during ambulation, with no deficits noted.    End of Session PT - End of Session Equipment Utilized During Treatment: Gait belt Activity Tolerance: Patient tolerated treatment well Patient left: in chair;with call bell/phone within reach Nurse Communication: Mobility status  GP  Kristin Hoffman, [place] [phone] 11/09/2012,  2:51 PM

## 2012-11-09 NOTE — H&P (Signed)
Hospital Admission Note Date: 11/09/2012  Patient name: Kristin Hoffman Medical record number: [id number] Date of birth: 1966-06-27 Age: 46 y.o. Gender: female PCP: No primary provider on file.  Medical Service:Internal Medicine Teaching Service  Attending physician:  Dr. Lonzo Cloud    1st Contact: Dr. Garald Braver   Pager:[phone] 2nd Contact:  Dr. Clyde Lundborg    [phone] After 5 pm or weekends: 1st Contact:      Pager: [phone] 2nd Contact:      Pager: [phone]  Chief Complaint: Stroke  History of Present Illness: Kristin Hoffman is a 46 yo woman with PMH of breast cancer now s/p radical double mastectomy and reconstructive surgery who presents to the [facility] ED as a transfer from the [facility] for evaluation of left MCA stroke. She was in her usual state of heatlh until 4PM yesterday when she had a left MCA stroke. She was visiting her parents and while pushing Christmas ornaments after a Thanksgiving dinner she suddenly felt "lost in space". She was able to walk to the kitchen and to talk to her mom but her words were slurred and her mother could not understand her. She set down, rested, tried to go to the bathroom but could not remember how to unzip her pants. She tried to talk to herself outload but states that she "made no sense". She was able to communicate to her husband that something was not right. Her mother took her blood pressure with a home blood pressure monitor, and it was elevated to ~165/100. At the time she started developing a headache. Her husband drove her to the [facility] ED and she was transferred to the [facility] last night. She denies loss of consciousness, urinary or bowel incontinence, dizziness, loss of balance, chest pain, calf pain, shortness of breath, or dysuria. Her speech was nonsensical for two hours but is now back to her baseline and she no longer feels spaced out. She had never experienced similar symptoms in the past.   She denies headache, chest pain, abdominal pain, or  dysuria.   Meds: No current facility-administered medications on file prior to encounter.   Current Outpatient Prescriptions on File Prior to Encounter  Medication Sig Dispense Refill  . cetirizine (ZYRTEC) 10 MG tablet Take 10 mg by mouth daily.        Marland Kitchen escitalopram (LEXAPRO) 20 MG tablet Take 20 mg by mouth daily.        Marland Kitchen esomeprazole (NEXIUM) 40 MG capsule Take 40 mg by mouth daily before breakfast.         Allergies: Allergies as of 11/08/2012  . (No Known Allergies)   Past Medical History  Diagnosis Date  . Breast cancer   . Depression   . Reflux    Past Surgical History  Procedure Date  . Mastectomy w/ nodes partial   . Mastectomy   . Breast reconstruction   . Portacath placement     x 2  . Portacath removal     x 2  . Abdominal hysterectomy    Family History: No cancer, no heart disease, no strokes, no hypercoagulability.   History   Social History  . Marital Status: Married    Spouse Name: N/A    Number of Children: N/A  . Years of Education: N/A   Occupational History  . Not on file.   Social History Main Topics  . Smoking status: Never Smoker   . Smokeless tobacco: Never Used  . Alcohol Use: Yes  Comment: rarely  . Drug Use: No  . Sexually Active: Yes    Birth Control/ Protection: Surgical   Other Topics Concern  . Not on file   Social History Narrative  . 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   Review of Systems: Pertinent items are noted in HPI.  Physical Exam: Blood pressure 133/78, pulse 64, temperature 98.2 F (36.8 C), temperature source Oral, resp. rate 18, height 5\' 4"  (1.626 m), weight 190 lb (86.183 kg), SpO2 99.00%. BP 133/78  Pulse 64  Temp 98.2 F (36.8 C) (Oral)  Resp 18  Ht 5\' 4"  (1.626 m)  Wt 190 lb (86.183 kg)  BMI 32.61 kg/m2  SpO2 99%  General Appearance:    Alert, pleasant, cooperative, no distress, appears stated age  Head:     Normocephalic, without obvious abnormality, atraumatic  Eyes:    PERRL, conjunctiva/corneas clear, EOM's intact,   Ears:    Normal TM's and external ear canals, both ears  Nose:   Nares normal, septum midline, mucosa normal, no drainage     Throat:   Lips, mucosa, and tongue normal; teeth and gums normal  Neck:   Supple, symmetrical, trachea midline, no adenopathy;      Back:     Symmetric, no curvature, ROM normal, no CVA tenderness  Lungs:     Clear to auscultation bilaterally, respirations unlabored  Chest Wall:    No tenderness or deformity   Heart:    Regular rate and rhythm, S1 and S2 normal, no murmur, rub   or gallop     Abdomen:     Soft, non-tender, bowel sounds active all four quadrants,    no masses, no organomegaly        Extremities:   Extremities normal, atraumatic, no cyanosis or edema  Pulses:   2+ and symmetric all extremities  Skin:   Skin color, texture, turgor normal, no rashes or lesions     Neurologic:   CNII-XII intact, normal strength, sensation and reflexes    Throughout, follows three step commands appropriately, sensation intact throughout, strength 5/5 in UE and LE.     Lab results: Basic Metabolic Panel:  Basename 11/08/12 1700  NA 141  K 4.0  CL 101  CO2 28  GLUCOSE 113*  BUN 12  CREATININE 1.00  CALCIUM 9.6  MG --  PHOS --   Liver Function Tests:  Basename 11/08/12 1700  AST 24  ALT 17  ALKPHOS 74  BILITOT 0.7  PROT 7.6  ALBUMIN 4.3   CBC:  Basename 11/08/12 1700  WBC 5.9  NEUTROABS 3.8  HGB 13.8  HCT 41.7  MCV 90.8  PLT 205   Cardiac Enzymes:  Basename 11/08/12 1700  CKTOTAL --  CKMB --  CKMBINDEX --  TROPONINI <0.30   Hemoglobin A1C:  Basename 11/08/12 1700  HGBA1C 5.6   Fasting Lipid Panel:  Basename 11/08/12 1700  CHOL 170  HDL 45  LDLCALC 101*  TRIG 122  CHOLHDL 3.8  LDLDIRECT --   Coagulation:  Basename 11/08/12 1700  LABPROT 13.3  INR 1.02   Urine Drug Screen: Drugs of Abuse     Component  Value Date/Time   LABOPIA NONE DETECTED 11/08/2012 1817   COCAINSCRNUR NONE DETECTED 11/08/2012 1817   LABBENZ NONE DETECTED 11/08/2012 1817   AMPHETMU NONE DETECTED 11/08/2012 1817   THCU NONE DETECTED 11/08/2012 1817   LABBARB NONE DETECTED 11/08/2012 1817  Urinalysis:  Basename 11/09/12 0005  COLORURINE YELLOW  LABSPEC 1.022  PHURINE 7.0  GLUCOSEU NEGATIVE  HGBUR NEGATIVE  BILIRUBINUR NEGATIVE  KETONESUR 15*  PROTEINUR NEGATIVE  UROBILINOGEN 1.0  NITRITE NEGATIVE  LEUKOCYTESUR TRACE*    Imaging results:  X-ray Chest Pa And Lateral   11/09/2012  *RADIOLOGY REPORT*  Clinical Data: Acute stroke involving the posterior insular cortex yesterday.  History of breast cancer.  CHEST - 2 VIEW  Comparison: CT chest 07/15/2010.  Portable chest x-ray 03/10/2010, 01/25/2010.  Two-view chest x-ray 01/20/2010.  Findings: Cardiomediastinal silhouette unremarkable.  Calcified granuloma in the right upper lobe, unchanged.  Lungs otherwise clear.  Bronchovascular markings normal.  No pleural effusions. Visualized bony thorax intact apart from slight thoracic scoliosis which is unchanged.  Bilateral mastectomy with bilateral breast prostheses.  IMPRESSION: No acute cardiopulmonary disease.   Original Report Authenticated By: Hulan Saas, M.D.    Ct Head Wo Contrast  11/08/2012  *RADIOLOGY REPORT*  Clinical Data: Headache, aphasia, dizziness  CT HEAD WITHOUT CONTRAST  Technique:  Contiguous axial images were obtained from the base of the skull through the vertex without contrast.  Comparison: None.  Findings: No acute hemorrhage, acute infarction, or mass lesion is identified.  No midline shift.  No ventriculomegaly.  No skull fracture.  Orbits and paranasal sinuses are unremarkable.  IMPRESSION: No acute intracranial finding.   Original Report Authenticated By: Christiana Pellant, M.D.    Mr Brain Wo Contrast  11/09/2012  *RADIOLOGY REPORT*  Clinical Data:  Expressive aphasia.  TIA.  MRI HEAD  WITHOUT CONTRAST MRA HEAD WITHOUT CONTRAST  Technique:  Multiplanar, multiecho pulse sequences of the brain and surrounding structures were obtained without intravenous contrast. Angiographic images of the head were obtained using MRA technique without contrast.  Comparison:  CT head 11/08/2012  MRI HEAD  Findings:  Small area of acute infarction in the posterior insular cortex on the left.  Small area of acute infarct in the left parietal cortex with minimal associated hemorrhage.  No other acute infarct.  Small hyperintensities in the frontal and parietal white matter bilaterally consistent with chronic microvascular ischemia. The brainstem and cerebellum are normal.  Ventricle size is normal.  No midline shift.  Negative for mass lesion.  IMPRESSION: Small acute infarct left posterior insular cortex.  Small hemorrhagic infarct left parietal cortex.  MRA HEAD  Findings: Mild motion on the study.  Both vertebral arteries are patent to the basilar.  PICA is patent bilaterally.  Basilar is widely patent.  The superior cerebellar and posterior cerebral arteries are patent bilaterally without stenosis.  Internal carotid artery is widely patent bilaterally.  The anterior and middle cerebral arteries are patent without significant stenosis.  Negative for cerebral aneurysm.  IMPRESSION: Negative   Original Report Authenticated By: Janeece Riggers, M.D.    Mr Mra Head/brain Wo Cm  11/09/2012  *RADIOLOGY REPORT*  Clinical Data:  Expressive aphasia.  TIA.  MRI HEAD WITHOUT CONTRAST MRA HEAD WITHOUT CONTRAST  Technique:  Multiplanar, multiecho pulse sequences of the brain and surrounding structures were obtained without intravenous contrast. Angiographic images of the head were obtained using MRA technique without contrast.  Comparison:  CT head 11/08/2012  MRI HEAD  Findings:  Small area of acute infarction in the posterior insular cortex on the left.  Small area of acute infarct in the left parietal cortex with minimal  associated hemorrhage.  No other acute infarct.  Small hyperintensities in the frontal and parietal white matter bilaterally consistent with chronic microvascular ischemia.  The brainstem and cerebellum are normal.  Ventricle size is normal.  No midline shift.  Negative for mass lesion.  IMPRESSION: Small acute infarct left posterior insular cortex.  Small hemorrhagic infarct left parietal cortex.  MRA HEAD  Findings: Mild motion on the study.  Both vertebral arteries are patent to the basilar.  PICA is patent bilaterally.  Basilar is widely patent.  The superior cerebellar and posterior cerebral arteries are patent bilaterally without stenosis.  Internal carotid artery is widely patent bilaterally.  The anterior and middle cerebral arteries are patent without significant stenosis.  Negative for cerebral aneurysm.  IMPRESSION: Negative   Original Report Authenticated By: Janeece Riggers, M.D.     Other results: EKG: sinus rhythm, normal rate, T wave inversion in leads V2-V4 Assessment & Plan by Problem: Ms. Parayno is a pleasant 46 yo woman with PMH of breast cancer s/p double mastectomy with bilateral reconstruction now with left MCA stroke   Left MCA infarct: MRI of her brain reveal two small infarcts in the posterior division of the MCA, c/w embolic stroke. This may be associated with a hypercoagulable state in the setting of breat cancer although the patient has been on remission. Her MRI also shows very small area of possible hemorrhagic stroke. The patient has been evaluated by Neurology at the ED.  -Admit to telemetry -Risk stratification: start statin for LDL>100, Hgb A1C of 5.6 -Echo negative for source of embolism, EF 50% to 55%. Mild hypokinesis of the septal myocardium. -TEE to be ordered tomorrow  -She may need Holter monitor as outpatient -Hypercoagulation panel.  -ASA 325mg   -PT/OT  History of breast cancer: breast cancer originally diagnosed 2006 status post TAC chemotherapy x6 followed by  radiation on tamoxifen followed by ovarian removal. Recurrent ER positive PR negative breast cancer (new genotype) but now status post bilateral mastectomies on 01/25/2010 with reconstruction with right tissue expander and latissimus dorsi flap on left. Status post one cycle of carboplatin and Gemzar followed by 6 cycles of CMF currently on Aromasin. Per her last Oncologist's note on 08/06/12, she is doing well, and would not benefit from screening mammography due to her bilateral mastectomies. She is tolerating Aromasin well.  -Hold Aromasin in the setting of possible hypercoagulable state if no cause of embolism is found.  -Will consult with Dr. Donnie Coffin   Depression: She takes Lexapro with PRN Ativan. She has difficulty sleeping, and uses Ativan for sleep. She reports not taking it every night. She has taken Ambien for sleep before but feels like this medicine makes too forgetful in the morning but while in the hospital, would like to take this to help her sleep.  -Ativan 1mg  PRN for anxiety -Ambien 5mg  PRN for sleep   Dispo: Disposition is deferred at this time, awaiting improvement of current medical problems. Anticipated discharge in approximately 2-3 day(s).   The patient dose have a current PCP Albertina Senegal, MD), therefore will not be requiring OPC follow-up after discharge.   The patient does not have transportation limitations that hinder transportation to clinic appointments.  Signed: Ky Barban 11/09/2012, 6:20 PM

## 2012-11-09 NOTE — ED Notes (Signed)
I gave the patient a warm blanket. 

## 2012-11-09 NOTE — Progress Notes (Signed)
  Echocardiogram 2D Echocardiogram has been performed.  Cathie Beams 11/09/2012, 8:42 AM

## 2012-11-09 NOTE — ED Provider Notes (Signed)
8:45 - Patient is in CDU on TIA protocol with a MRI positive for hemorrhagic lesion and separate area of acute infarct. She remains asymptomatic - no speech difficulties, focused thinking. She reports she did not sleep but feels well. Discussed with neurology who will consult.  10:15 - Dr. Amada Jupiter with neurology in to see patient and requests medical admission. As unassigned patient she will be admitted by [facility] clinic.  Rodena Medin, PA-C 11/09/12 1020

## 2012-11-09 NOTE — ED Notes (Signed)
Patient transported to MRI 

## 2012-11-09 NOTE — ED Provider Notes (Signed)
Medical screening examination/treatment/procedure(s) were performed by non-physician practitioner and as supervising physician I was immediately available for consultation/collaboration.   Rolan Bucco, MD 11/09/12 1227

## 2012-11-10 DIAGNOSIS — C50919 Malignant neoplasm of unspecified site of unspecified female breast: Secondary | ICD-10-CM

## 2012-11-10 DIAGNOSIS — Z853 Personal history of malignant neoplasm of breast: Secondary | ICD-10-CM

## 2012-11-10 DIAGNOSIS — Z1231 Encounter for screening mammogram for malignant neoplasm of breast: Secondary | ICD-10-CM

## 2012-11-10 DIAGNOSIS — E559 Vitamin D deficiency, unspecified: Secondary | ICD-10-CM

## 2012-11-10 LAB — PROTIME-INR: Prothrombin Time: 14 seconds (ref 11.6–15.2)

## 2012-11-10 LAB — COMPREHENSIVE METABOLIC PANEL
ALT: 15 U/L (ref 0–35)
Alkaline Phosphatase: 64 U/L (ref 39–117)
BUN: 11 mg/dL (ref 6–23)
Chloride: 106 mEq/L (ref 96–112)
GFR calc Af Amer: 89 mL/min — ABNORMAL LOW (ref >90)
Glucose, Bld: 97 mg/dL (ref 70–99)
Potassium: 3.8 mEq/L (ref 3.5–5.1)
Sodium: 144 mEq/L (ref 135–145)
Total Bilirubin: 0.6 mg/dL (ref 0.3–1.2)

## 2012-11-10 MED ORDER — CETIRIZINE HCL 10 MG PO TABS
10.0000 mg | ORAL_TABLET | Freq: Every day | ORAL | Status: DC | PRN
  Administered 2012-11-10 – 2012-11-11 (×2): 10 mg via ORAL
  Filled 2012-11-10 (×2): qty 1

## 2012-11-10 MED ORDER — NON FORMULARY: 10.0000 mg | Freq: Four times a day (QID) | Status: DC | PRN

## 2012-11-10 MED ORDER — IBUPROFEN 800 MG PO TABS
800.0000 mg | ORAL_TABLET | Freq: Four times a day (QID) | ORAL | Status: DC | PRN
  Administered 2012-11-10 – 2012-11-11 (×2): 800 mg via ORAL
  Filled 2012-11-10 (×2): qty 1

## 2012-11-10 MED ORDER — IBUPROFEN 400 MG PO TABS
400.0000 mg | ORAL_TABLET | ORAL | Status: DC | PRN
  Administered 2012-11-10: 400 mg via ORAL
  Filled 2012-11-10: qty 1

## 2012-11-10 NOTE — Plan of Care (Signed)
Problem: Phase II Progression Outcomes Goal: Neuro status stablized/improved Outcome: Completed/Met Date Met:  11/10/12 Patient NIH is 0.  No neuro deficits noted during assessment.

## 2012-11-10 NOTE — Progress Notes (Signed)
46 yo RH WF with acute expressive aphasia on 11/28th, MRI showed small acute infarct left posterior insular cortex.  Small hemorrhagic infarct left parietal cortex. She has history of left breast cancer twice 2006, 2010, with positive lymph node, had chemotherapy, radiation therapy. She is now taking aromasin. She is now back to baseline.  On exam: 98, 120/67, HR 64.  CV: regular rate rhythm Pulm: clear to ausculation bilaterally.  awake, alert. No dysarthria, no aphasia. CN ii-Xii: normal. Motor: normal tone, bulk and strength. Sensory: intact. DTR: present and symmetric. Cor: normal finger to nose, heel to shin. Gait: steady.  A/P: 46  Yo with history of breast cancer, on Aromasin, now with small stroke in Lt MCA, mechanism of stroke including embolic, also potential medicine side effect.  1. Keep asa. 2. Complete evaluation, including hypercoagulable workup, ECHO, TEE. 3. the randomized, double-blind, multicenter Intergroup Exemestane (Aromasin) Study (IES), 6 deaths due to stroke occurred in patients receiving 2 to 3 years of exemestane after previously receiving 2 to 3 years of tamoxifen (n=2252) compared with 2 deaths due to stroke in patients receiving 5 years of tamoxifen (n=2280) for the adjuvant treatment of early breast cancer    Aromasin: The principle source of circulating estrogen in postmenopausal women comes from conversion of androstenedione and testosterone (synthesized in the adrenals and ovaries) to estrone and estradiol via the aromatase enzyme. Exemestane acts as a false substrate for the aromatase enzyme, causing an irreversible inhibition. This inhibition results in estrogen deprivation to hormone dependent breast cancer cells.  4. If no definite etiology found for her stroke, may inform oncologist the potential risk with aromasin.

## 2012-11-10 NOTE — Plan of Care (Signed)
Problem: Phase I Progression Outcomes Goal: Pain controlled with appropriate interventions Outcome: Progressing Have received two orders for Ibuprofen; dose increased to 800 mg every six hours. Goal: Hemodynamically stable Outcome: Progressing Lab work has not shown any changes from admissions.

## 2012-11-10 NOTE — H&P (Signed)
Internal Medicine teaching Service Attending Dr.Shanica Castellanos. I have personally examined the patient and reviewed the h and P documented by the Resident. In brief  Chief complaint:confusion  HOPI: Patient was going about doing Scientist, product/process development when she noticed sudden onset of confusion. 9 point review of system as documented in the Resident note. Social history admitting medication family history past surgical history allergies reviewed. Physical examination Notable for:  Intact neuro exam , no swelling in feet. Labs are significant for : normal blood work  Imaging is significant for: two areas of ischemia with small hemorrhage in left MCA territory  EKG: new T wave inversion from V2 to V4 A and P: Neuro: appreciate input. Looking for hyper coagulable states. Would hold aromasin for now. Would get Lower extremity doppler to r/o DVT even though no clinical evidence of DVT  TEE with bubble study for PFO. Rest per resident documentation.

## 2012-11-10 NOTE — Progress Notes (Signed)
Subjective: She complained of a headache this morning but it has improved with Advil. She had a shower and is in good spirits. She specifically denies chest pain or palpitations, heart flutter, shortness of breath, calf pain or swelling, changes in her speech or gait changes.   Objective: Vital signs in last 24 hours: Filed Vitals:   11/09/12 1829 11/09/12 2200 11/10/12 0624 11/10/12 0954  BP: 125/79 131/85 120/67 128/82  Pulse: 66 71 69 85  Temp: 98 F (36.7 C) 98.2 F (36.8 C) 98.1 F (36.7 C) 98.7 F (37.1 C)  TempSrc: Oral Oral Oral   Resp: 17 18 18 16   Height:      Weight:      SpO2: 97% 98% 96% 98%   Weight change:   Intake/Output Summary (Last 24 hours) at 11/10/12 1037 Last data filed at 11/10/12 0952  Gross per 24 hour  Intake    126 ml  Output      0 ml  Net    126 ml   Vitals reviewed. General: resting in bed, in NAD, her husband at her bedside HEENT: PERRL, EOMI, no scleral icterus Cardiac: RRR, no rubs, murmurs or gallops Pulm: clear to auscultation bilaterally, no wheezes, rales, or rhonchi Abd: soft, nontender, nondistended, BS present Ext: warm and well perfused, no pedal edema Neuro: alert and oriented X3, cranial nerves II-XII grossly intact, strength and sensation to light touch equal in bilateral upper and lower extremities. Speech   Lab Results: Basic Metabolic Panel:  Lab 11/10/12 0981 11/08/12 1700  NA 144 141  K 3.8 4.0  CL 106 101  CO2 26 28  GLUCOSE 97 113*  BUN 11 12  CREATININE 0.89 1.00  CALCIUM 9.3 9.6  MG -- --  PHOS -- --   Liver Function Tests:  Lab 11/10/12 0455 11/08/12 1700  AST 17 24  ALT 15 17  ALKPHOS 64 74  BILITOT 0.6 0.7  PROT 6.6 7.6  ALBUMIN 3.7 4.3   CBC:  Lab 11/08/12 1700  WBC 5.9  NEUTROABS 3.8  HGB 13.8  HCT 41.7  MCV 90.8  PLT 205   Cardiac Enzymes:  Lab 11/08/12 1700  CKTOTAL --  CKMB --  CKMBINDEX --  TROPONINI <0.30   Hemoglobin A1C:  Lab 11/08/12 1700  HGBA1C 5.6   Fasting  Lipid Panel:  Lab 11/08/12 1700  CHOL 170  HDL 45  LDLCALC 101*  TRIG 122  CHOLHDL 3.8  LDLDIRECT --   Coagulation:  Lab 11/10/12 0455 11/08/12 1700  LABPROT 14.0 13.3  INR 1.09 1.02    Urine Drug Screen: Drugs of Abuse     Component Value Date/Time   LABOPIA NONE DETECTED 11/08/2012 1817   COCAINSCRNUR NONE DETECTED 11/08/2012 1817   LABBENZ NONE DETECTED 11/08/2012 1817   AMPHETMU NONE DETECTED 11/08/2012 1817   THCU NONE DETECTED 11/08/2012 1817   LABBARB NONE DETECTED 11/08/2012 1817    Urinalysis:  Lab 11/09/12 0005  COLORURINE YELLOW  LABSPEC 1.022  PHURINE 7.0  GLUCOSEU NEGATIVE  HGBUR NEGATIVE  BILIRUBINUR NEGATIVE  KETONESUR 15*  PROTEINUR NEGATIVE  UROBILINOGEN 1.0  NITRITE NEGATIVE  LEUKOCYTESUR TRACE*    Studies/Results: X-ray Chest Pa And Lateral   11/09/2012  *RADIOLOGY REPORT*  Clinical Data: Acute stroke involving the posterior insular cortex yesterday.  History of breast cancer.  CHEST - 2 VIEW  Comparison: CT chest 07/15/2010.  Portable chest x-ray 03/10/2010, 01/25/2010.  Two-view chest x-ray 01/20/2010.  Findings: Cardiomediastinal silhouette unremarkable.  Calcified granuloma in  the right upper lobe, unchanged.  Lungs otherwise clear.  Bronchovascular markings normal.  No pleural effusions. Visualized bony thorax intact apart from slight thoracic scoliosis which is unchanged.  Bilateral mastectomy with bilateral breast prostheses.  IMPRESSION: No acute cardiopulmonary disease.   Original Report Authenticated By: Hulan Saas, M.D.    Ct Head Wo Contrast  11/08/2012  *RADIOLOGY REPORT*  Clinical Data: Headache, aphasia, dizziness  CT HEAD WITHOUT CONTRAST  Technique:  Contiguous axial images were obtained from the base of the skull through the vertex without contrast.  Comparison: None.  Findings: No acute hemorrhage, acute infarction, or mass lesion is identified.  No midline shift.  No ventriculomegaly.  No skull fracture.  Orbits and  paranasal sinuses are unremarkable.  IMPRESSION: No acute intracranial finding.   Original Report Authenticated By: Christiana Pellant, M.D.    Mr Brain Wo Contrast  11/09/2012  *RADIOLOGY REPORT*  Clinical Data:  Expressive aphasia.  TIA.  MRI HEAD WITHOUT CONTRAST MRA HEAD WITHOUT CONTRAST  Technique:  Multiplanar, multiecho pulse sequences of the brain and surrounding structures were obtained without intravenous contrast. Angiographic images of the head were obtained using MRA technique without contrast.  Comparison:  CT head 11/08/2012  MRI HEAD  Findings:  Small area of acute infarction in the posterior insular cortex on the left.  Small area of acute infarct in the left parietal cortex with minimal associated hemorrhage.  No other acute infarct.  Small hyperintensities in the frontal and parietal white matter bilaterally consistent with chronic microvascular ischemia. The brainstem and cerebellum are normal.  Ventricle size is normal.  No midline shift.  Negative for mass lesion.  IMPRESSION: Small acute infarct left posterior insular cortex.  Small hemorrhagic infarct left parietal cortex.  MRA HEAD  Findings: Mild motion on the study.  Both vertebral arteries are patent to the basilar.  PICA is patent bilaterally.  Basilar is widely patent.  The superior cerebellar and posterior cerebral arteries are patent bilaterally without stenosis.  Internal carotid artery is widely patent bilaterally.  The anterior and middle cerebral arteries are patent without significant stenosis.  Negative for cerebral aneurysm.  IMPRESSION: Negative   Original Report Authenticated By: Janeece Riggers, M.D.    Mr Mra Head/brain Wo Cm  11/09/2012  *RADIOLOGY REPORT*  Clinical Data:  Expressive aphasia.  TIA.  MRI HEAD WITHOUT CONTRAST MRA HEAD WITHOUT CONTRAST  Technique:  Multiplanar, multiecho pulse sequences of the brain and surrounding structures were obtained without intravenous contrast. Angiographic images of the head were  obtained using MRA technique without contrast.  Comparison:  CT head 11/08/2012  MRI HEAD  Findings:  Small area of acute infarction in the posterior insular cortex on the left.  Small area of acute infarct in the left parietal cortex with minimal associated hemorrhage.  No other acute infarct.  Small hyperintensities in the frontal and parietal white matter bilaterally consistent with chronic microvascular ischemia. The brainstem and cerebellum are normal.  Ventricle size is normal.  No midline shift.  Negative for mass lesion.  IMPRESSION: Small acute infarct left posterior insular cortex.  Small hemorrhagic infarct left parietal cortex.  MRA HEAD  Findings: Mild motion on the study.  Both vertebral arteries are patent to the basilar.  PICA is patent bilaterally.  Basilar is widely patent.  The superior cerebellar and posterior cerebral arteries are patent bilaterally without stenosis.  Internal carotid artery is widely patent bilaterally.  The anterior and middle cerebral arteries are patent without significant stenosis.  Negative for  cerebral aneurysm.  IMPRESSION: Negative   Original Report Authenticated By: Janeece Riggers, M.D.    Medications: I have reviewed the patient's current medications. Scheduled Meds:   . aspirin EC  325 mg Oral Daily  . atorvastatin  40 mg Oral q1800  . escitalopram  20 mg Oral Daily  . loratadine  10 mg Oral Daily  . pantoprazole  40 mg Oral Q supper  . sodium chloride  3 mL Intravenous Q12H  . [DISCONTINUED] aspirin  325 mg Oral Daily  . [DISCONTINUED] atorvastatin  20 mg Oral q1800   Continuous Infusions:  PRN Meds:.acetaminophen, zolpidem Assessment/Plan: Ms. Kristin Hoffman is a pleasant 46 yo woman with PMH of breast cancer s/p double mastectomy with bilateral reconstruction now with left MCA stroke   Left MCA infarct: MRI of her brain reveal two small infarcts in the posterior division of the MCA, c/w embolic stroke. This may be associated with a hypercoagulable state in  the setting of breat cancer although the patient has been on remission. Her MRI also shows very small area of possible hemorrhagic stroke. The patient has been evaluated by Neurology at the ED. Her 2D echo was negative for source of embolism, EF 50% to 55%, mild hypokinesis of the septal myocardium.  -Risk stratification: continued statin for LDL of 101 (goal is <100) Hgb A1C of 5.6  -She may need Holter monitor as outpatient  -Pt has seen and evaluated the patient, no need for further PT at this time Plan: -Continue telemetry  -TEE to be done on Monday, per Dr. Anne Fu in cardiology. Placed order NPO after midnight on Sunday.  -ASA 325mg   -Lipitor 40mg  daily -f/u OT recs -Hold Aromasin in the setting of emboli (rare, <1%, reports of thromboembolism with this medication)  History of breast cancer: breast cancer originally diagnosed 2006 status post TAC chemotherapy x6 followed by radiation on tamoxifen followed by ovarian removal. Recurrent ER positive PR negative breast cancer (new genotype) but now status post bilateral mastectomies on 01/25/2010 with reconstruction with right tissue expander and latissimus dorsi flap on left. Status post one cycle of carboplatin and Gemzar followed by 6 cycles of CMF currently on Aromasin. Per her last Oncologist's note on 08/06/12, she is doing well, and would not benefit from screening mammography due to her bilateral mastectomies. She has tolerated Aromasin well until now.  -Hold Aromasin in the setting of possible hypercoagulable state if no cause of embolism is found.  -Will consult with Dr. Donnie Coffin for possible discontinuation of Aromasin if no cause for her emboli is found.   Depression: She takes Lexapro with PRN Ativan. She has difficulty sleeping, and uses Ativan for sleep. She reports not taking it every night. She has taken Ambien for sleep before but feels like this medicine makes too forgetful in the morning but while in the hospital, would like to take  this to help her sleep.  -Ativan 1mg  PRN for anxiety  -Ambien 5mg  PRN for sleep   Dispo: Disposition is deferred at this time, awaiting improvement of current medical problems. Anticipated discharge in approximately 2-3 day(s).  The patient dose have a current PCP Albertina Senegal, MD), therefore will not be requiring OPC follow-up after discharge.  The patient does not have transportation limitations that hinder transportation to clinic appointments.    LOS: 2 days   Ky Barban 11/10/2012, 10:37 AM

## 2012-11-10 NOTE — Plan of Care (Signed)
Problem: Phase I Progression Outcomes Goal: BP within ordered parameters Outcome: Completed/Met Date Met:  11/10/12 Patient is WNL for stated parameters. Goal: Initial discharge plan identified Outcome: Progressing Discussed discharge plans for patient upon completion of scheduled tests. Goal: Voiding-avoid urinary catheter unless indicated Outcome: Completed/Met Date Met:  11/10/12 Patient able to ambulate to bathroom independently and urinate.

## 2012-11-10 NOTE — Plan of Care (Signed)
Problem: Phase I Progression Outcomes Goal: Antithrombotic given by end of Day 2 Outcome: Completed/Met Date Met:  11/10/12 Patient given ASA 81 mg daily. Goal: Bedrest with HOB 0-30 degrees Outcome: Completed/Met Date Met:  11/10/12 Patient has been evaluated by therapy and pt. Is now independent with ambulation.

## 2012-11-10 NOTE — Consult Note (Signed)
Admit date: 11/08/2012 Referring Physician  Dr. Garald Braver Primary Physician No primary provider on file. Primary Cardiologist  None Reason for Consultation  TEE to evaluate for possible embolic phenomenon.  HPI: 46 year old female with recent stroke, possible embolic, left MCA with prior history of breast cancer status post radical double mastectomy with reconstructive surgery. After Thanksgiving dinner she suddenly felt lost in space she states was unable to walk to the kitchen in her words were slurred. Her blood pressure at the time was 165/100. She developed a headache. No prior cardiovascular illness. She does not having trouble swallowing. No neck pain.    PMH:   Past Medical History  Diagnosis Date  . Breast cancer   . Depression   . Reflux     PSH:   Past Surgical History  Procedure Date  . Mastectomy w/ nodes partial   . Mastectomy   . Breast reconstruction   . Portacath placement     x 2  . Portacath removal     x 2  . Abdominal hysterectomy    Allergies:  Review of patient's allergies indicates no known allergies. Prior to Admit Meds:   Prescriptions prior to admission  Medication Sig Dispense Refill  . cetirizine (ZYRTEC) 10 MG tablet Take 10 mg by mouth daily.        Marland Kitchen escitalopram (LEXAPRO) 20 MG tablet Take 20 mg by mouth daily.        Marland Kitchen esomeprazole (NEXIUM) 40 MG capsule Take 40 mg by mouth daily before breakfast.        . exemestane (AROMASIN) 25 MG tablet Take 25 mg by mouth daily after breakfast.       Fam HX:   No family history on file. Social HX:    History   Social History  . Marital Status: Married    Spouse Name: N/A    Number of Children: N/A  . Years of Education: N/A   Occupational History  . Not on file.   Social History Main Topics  . Smoking status: Never Smoker   . Smokeless tobacco: Never Used  . Alcohol Use: Yes     Comment: rarely  . Drug Use: No  . Sexually Active: Yes    Birth Control/ Protection: Surgical   Other Topics  Concern  . Not on file   Social History Narrative  . No narrative on file     ROS: No chest pain, no shortness of breath, no orthopnea, no fevers, no chills, no syncope.  All 11 ROS were addressed and are negative except what is stated in the HPI  Physical Exam: Blood pressure 128/82, pulse 85, temperature 98.7 F (37.1 C), temperature source Oral, resp. rate 16, height 5\' 4"  (1.626 m), weight 86.183 kg (190 lb), SpO2 98.00%.    General: Well developed, well nourished, in no acute distress Head: Eyes PERRLA, No xanthomas.   Normal cephalic and atramatic  Lungs:   Clear bilaterally to auscultation and percussion. Normal respiratory effort. No wheezes, no rales. Heart:   HRRR S1 S2 no appreciable murmurs, patient states that she had a murmur in the past. Abdomen: Soft. Nontender. Msk:  Back normal. Normal strength and tone for age. Extremities:   No clubbing, cyanosis or edema.  DP +1 Neuro: Alert and oriented X 3, non-focal, MAE x 4 GU: Deferred Rectal: Deferred Psych:  Good affect, responds appropriately    Labs:   Lab Results  Component Value Date   WBC 5.9 11/08/2012   HGB 13.8  11/08/2012   HCT 41.7 11/08/2012   MCV 90.8 11/08/2012   PLT 205 11/08/2012    Lab 11/10/12 0455  NA 144  K 3.8  CL 106  CO2 26  BUN 11  CREATININE 0.89  CALCIUM 9.3  PROT 6.6  BILITOT 0.6  ALKPHOS 64  ALT 15  AST 17  GLUCOSE 97   No results found for this basename: PTT   Lab Results  Component Value Date   INR 1.09 11/10/2012   INR 1.02 11/08/2012   Lab Results  Component Value Date   TROPONINI <0.30 11/08/2012     Lab Results  Component Value Date   CHOL 170 11/08/2012   Lab Results  Component Value Date   HDL 45 11/08/2012   Lab Results  Component Value Date   LDLCALC 101* 11/08/2012   Lab Results  Component Value Date   TRIG 122 11/08/2012   Lab Results  Component Value Date   CHOLHDL 3.8 11/08/2012   No results found for this basename: LDLDIRECT        Radiology:  X-ray Chest Pa And Lateral   11/09/2012  *RADIOLOGY REPORT*  Clinical Data: Acute stroke involving the posterior insular cortex yesterday.  History of breast cancer.  CHEST - 2 VIEW  Comparison: CT chest 07/15/2010.  Portable chest x-ray 03/10/2010, 01/25/2010.  Two-view chest x-ray 01/20/2010.  Findings: Cardiomediastinal silhouette unremarkable.  Calcified granuloma in the right upper lobe, unchanged.  Lungs otherwise clear.  Bronchovascular markings normal.  No pleural effusions. Visualized bony thorax intact apart from slight thoracic scoliosis which is unchanged.  Bilateral mastectomy with bilateral breast prostheses.  IMPRESSION: No acute cardiopulmonary disease.   Original Report Authenticated By: Hulan Saas, M.D.    Ct Head Wo Contrast  11/08/2012  *RADIOLOGY REPORT*  Clinical Data: Headache, aphasia, dizziness  CT HEAD WITHOUT CONTRAST  Technique:  Contiguous axial images were obtained from the base of the skull through the vertex without contrast.  Comparison: None.  Findings: No acute hemorrhage, acute infarction, or mass lesion is identified.  No midline shift.  No ventriculomegaly.  No skull fracture.  Orbits and paranasal sinuses are unremarkable.  IMPRESSION: No acute intracranial finding.   Original Report Authenticated By: Christiana Pellant, M.D.    Mr Brain Wo Contrast  11/09/2012  *RADIOLOGY REPORT*  Clinical Data:  Expressive aphasia.  TIA.  MRI HEAD WITHOUT CONTRAST MRA HEAD WITHOUT CONTRAST  Technique:  Multiplanar, multiecho pulse sequences of the brain and surrounding structures were obtained without intravenous contrast. Angiographic images of the head were obtained using MRA technique without contrast.  Comparison:  CT head 11/08/2012  MRI HEAD  Findings:  Small area of acute infarction in the posterior insular cortex on the left.  Small area of acute infarct in the left parietal cortex with minimal associated hemorrhage.  No other acute infarct.  Small  hyperintensities in the frontal and parietal white matter bilaterally consistent with chronic microvascular ischemia. The brainstem and cerebellum are normal.  Ventricle size is normal.  No midline shift.  Negative for mass lesion.  IMPRESSION: Small acute infarct left posterior insular cortex.  Small hemorrhagic infarct left parietal cortex.  MRA HEAD  Findings: Mild motion on the study.  Both vertebral arteries are patent to the basilar.  PICA is patent bilaterally.  Basilar is widely patent.  The superior cerebellar and posterior cerebral arteries are patent bilaterally without stenosis.  Internal carotid artery is widely patent bilaterally.  The anterior and middle cerebral arteries are patent  without significant stenosis.  Negative for cerebral aneurysm.  IMPRESSION: Negative   Original Report Authenticated By: Janeece Riggers, M.D.    Mr Mra Head/brain Wo Cm  11/09/2012  *RADIOLOGY REPORT*  Clinical Data:  Expressive aphasia.  TIA.  MRI HEAD WITHOUT CONTRAST MRA HEAD WITHOUT CONTRAST  Technique:  Multiplanar, multiecho pulse sequences of the brain and surrounding structures were obtained without intravenous contrast. Angiographic images of the head were obtained using MRA technique without contrast.  Comparison:  CT head 11/08/2012  MRI HEAD  Findings:  Small area of acute infarction in the posterior insular cortex on the left.  Small area of acute infarct in the left parietal cortex with minimal associated hemorrhage.  No other acute infarct.  Small hyperintensities in the frontal and parietal white matter bilaterally consistent with chronic microvascular ischemia. The brainstem and cerebellum are normal.  Ventricle size is normal.  No midline shift.  Negative for mass lesion.  IMPRESSION: Small acute infarct left posterior insular cortex.  Small hemorrhagic infarct left parietal cortex.  MRA HEAD  Findings: Mild motion on the study.  Both vertebral arteries are patent to the basilar.  PICA is patent  bilaterally.  Basilar is widely patent.  The superior cerebellar and posterior cerebral arteries are patent bilaterally without stenosis.  Internal carotid artery is widely patent bilaterally.  The anterior and middle cerebral arteries are patent without significant stenosis.  Negative for cerebral aneurysm.  IMPRESSION: Negative   Original Report Authenticated By: Janeece Riggers, M.D.    Personally viewed.  EKG:  Sinus rhythm, normal intervals, no ST segment changes Personally viewed.   ASSESSMENT/PLAN:   46 year old female with recent MCA stroke, prior breast cancer.   1. MCA stroke-I discussed transesophageal echocardiogram and went over risks/benefits including esophageal perforation which is 1 and 10,000. She has had an upper endoscopy in the past. She does not have any trouble with swallowing nor any neck instability. We will go ahead and set up for Monday. Please make n.p.o. Sunday night.  Donato Schultz, MD  11/10/2012  1:00 PM

## 2012-11-10 NOTE — Progress Notes (Signed)
Order received, chart reviewed, noted pt at an Independent level with PT yesterday. Spoke with pt briefly about OT services and she says she does not have any issues with BADLs at this point. Did let her know of the importance of getting to the ED/ER ASAP if she has any of the stroke symptoms again--she verbalized understanding. Eval not completed.

## 2012-11-10 NOTE — Plan of Care (Signed)
Problem: Phase II Progression Outcomes Goal: Able to communicate Outcome: Adequate for Discharge Patient able to verbalize needs with no assistive devices. Goal: Tolerates increased mobility Outcome: Completed/Met Date Met:  11/10/12 Patient ambulating with steady gait in room. Goal: Nutritional status adequate Outcome: Completed/Met Date Met:  11/10/12 Patient able to tolerate regular diet with no c/o nausea.

## 2012-11-11 DIAGNOSIS — I6350 Cerebral infarction due to unspecified occlusion or stenosis of unspecified cerebral artery: Secondary | ICD-10-CM

## 2012-11-11 LAB — CBC
HCT: 39.7 % (ref 36.0–46.0)
Hemoglobin: 12.9 g/dL (ref 12.0–15.0)
MCH: 29.5 pg (ref 26.0–34.0)
MCHC: 32.5 g/dL (ref 30.0–36.0)
MCV: 90.8 fL (ref 78.0–100.0)
Platelets: 187 K/uL (ref 150–400)
RBC: 4.37 MIL/uL (ref 3.87–5.11)
RDW: 12.6 % (ref 11.5–15.5)
WBC: 5.8 K/uL (ref 4.0–10.5)

## 2012-11-11 LAB — COMPREHENSIVE METABOLIC PANEL WITH GFR
ALT: 13 U/L (ref 0–35)
AST: 15 U/L (ref 0–37)
Albumin: 3.4 g/dL — ABNORMAL LOW (ref 3.5–5.2)
Alkaline Phosphatase: 63 U/L (ref 39–117)
BUN: 15 mg/dL (ref 6–23)
CO2: 26 meq/L (ref 19–32)
Calcium: 8.9 mg/dL (ref 8.4–10.5)
Chloride: 105 meq/L (ref 96–112)
Creatinine, Ser: 0.87 mg/dL (ref 0.50–1.10)
GFR calc Af Amer: >90 mL/min
GFR calc non Af Amer: 79 mL/min — ABNORMAL LOW
Glucose, Bld: 92 mg/dL (ref 70–99)
Potassium: 3.7 meq/L (ref 3.5–5.1)
Sodium: 141 meq/L (ref 135–145)
Total Bilirubin: 0.4 mg/dL (ref 0.3–1.2)
Total Protein: 6.2 g/dL (ref 6.0–8.3)

## 2012-11-11 MED ORDER — TRAMADOL HCL 50 MG PO TABS
100.0000 mg | ORAL_TABLET | Freq: Four times a day (QID) | ORAL | Status: DC | PRN
  Administered 2012-11-11 – 2012-11-12 (×4): 100 mg via ORAL
  Filled 2012-11-11 (×5): qty 2

## 2012-11-11 NOTE — Progress Notes (Signed)
VASCULAR LAB PRELIMINARY  PRELIMINARY  PRELIMINARY  PRELIMINARY  Bilateral lower extremity venous duplex completed.    Preliminary report:  Bilateral:  No evidence of DVT, superficial thrombosis, or Baker's Cyst.   Jenniferlynn Saad, RVS 11/11/2012, 10:52 AM

## 2012-11-11 NOTE — Progress Notes (Signed)
Patient IV cath had migrated out of patient arm; dried blood under intact dressing.  Attempted once to restart iv with 22 gauge angio with no success.  Patient requested to be without an iv for the night.  Informed patient that an IV would have to be restarted prior to TEE in am.  Patient verbalized understanding.

## 2012-11-11 NOTE — Progress Notes (Signed)
Subjective:  Feeling well with no complaints. No SOB, no CP, no further stroke like symptoms.   Objective:  Vital Signs in the last 24 hours: Temp:  [97.9 F (36.6 C)-98.7 F (37.1 C)] 97.9 F (36.6 C) (12/01 0621) Pulse Rate:  [71-91] 71  (12/01 0621) Resp:  [16-20] 20  (12/01 0621) BP: (111-128)/(63-82) 121/75 mmHg (12/01 0621) SpO2:  [97 %-99 %] 97 % (12/01 0621)  Intake/Output from previous day: 11/30 0701 - 12/01 0700 In: 603 [P.O.:600; I.V.:3] Out: -    Physical Exam: General: Well developed, well nourished, in no acute distress. Head:  Normocephalic and atraumatic. Lungs: Clear to auscultation and percussion. Heart: Normal S1 and S2.  No murmur, rubs or gallops.  Abdomen: soft, non-tender, positive bowel sounds. Extremities: No clubbing or cyanosis. No edema. Neurologic: Alert and oriented x 3.    Lab Results:  Basename 11/11/12 0624 11/08/12 1700  WBC 5.8 5.9  HGB 12.9 13.8  PLT 187 205    Basename 11/11/12 0624 11/10/12 0455  NA 141 144  K 3.7 3.8  CL 105 106  CO2 26 26  GLUCOSE 92 97  BUN 15 11  CREATININE 0.87 0.89    Basename 11/08/12 1700  TROPONINI <0.30   Hepatic Function Panel  Basename 11/11/12 0624  PROT 6.2  ALBUMIN 3.4*  AST 15  ALT 13  ALKPHOS 63  BILITOT 0.4  BILIDIR --  IBILI --    Basename 11/08/12 1700  CHOL 170     Assessment/Plan:  Principal Problem:  *History of breast cancer Active Problems:  Stroke  -TEE planned for Monday.  -NPO after midnight   Eamon Tantillo 11/11/2012, 9:39 AM

## 2012-11-11 NOTE — Progress Notes (Signed)
Subjective: No acute events overnight.  Patient noted a headache again last night, somewhat relieved by advil.  Otherwise in good spirits.  LE doppler today prelim shows no DVT.  Awaiting TEE tomorrow, then possibly discharge tomorrow afternoon.  Objective: Vital signs in last 24 hours: Filed Vitals:   11/10/12 1737 11/10/12 2202 11/11/12 0253 11/11/12 0621  BP: 117/63 123/70 111/66 121/75  Pulse: 73 91 72 71  Temp: 98 F (36.7 C) 97.9 F (36.6 C) 98 F (36.7 C) 97.9 F (36.6 C)  TempSrc:  Oral Oral Oral  Resp: 20 20 20 20   Height:      Weight:      SpO2: 97% 98% 97% 97%   Weight change:   Intake/Output Summary (Last 24 hours) at 11/11/12 1202 Last data filed at 11/10/12 2300  Gross per 24 hour  Intake    600 ml  Output      0 ml  Net    600 ml   Vitals reviewed. General: resting in bed, in NAD, her husband at her bedside HEENT: PERRL, EOMI, no scleral icterus Cardiac: RRR, no rubs, murmurs or gallops Pulm: clear to auscultation bilaterally, no wheezes, rales, or rhonchi Abd: soft, nontender, nondistended, BS present Ext: warm and well perfused, no pedal edema Neuro: alert and oriented X3, cranial nerves II-XII grossly intact, strength and sensation to light touch equal in bilateral upper and lower extremities. Speech   Lab Results: Basic Metabolic Panel:  Lab 11/11/12 4098 11/10/12 0455  NA 141 144  K 3.7 3.8  CL 105 106  CO2 26 26  GLUCOSE 92 97  BUN 15 11  CREATININE 0.87 0.89  CALCIUM 8.9 9.3  MG -- --  PHOS -- --   Liver Function Tests:  Lab 11/11/12 0624 11/10/12 0455  AST 15 17  ALT 13 15  ALKPHOS 63 64  BILITOT 0.4 0.6  PROT 6.2 6.6  ALBUMIN 3.4* 3.7   CBC:  Lab 11/11/12 0624 11/08/12 1700  WBC 5.8 5.9  NEUTROABS -- 3.8  HGB 12.9 13.8  HCT 39.7 41.7  MCV 90.8 90.8  PLT 187 205   Cardiac Enzymes:  Lab 11/08/12 1700  CKTOTAL --  CKMB --  CKMBINDEX --  TROPONINI <0.30   Hemoglobin A1C:  Lab 11/08/12 1700  HGBA1C 5.6   Fasting  Lipid Panel:  Lab 11/08/12 1700  CHOL 170  HDL 45  LDLCALC 101*  TRIG 122  CHOLHDL 3.8  LDLDIRECT --   Coagulation:  Lab 11/10/12 0455 11/08/12 1700  LABPROT 14.0 13.3  INR 1.09 1.02    Urine Drug Screen: Drugs of Abuse     Component Value Date/Time   LABOPIA NONE DETECTED 11/08/2012 1817   COCAINSCRNUR NONE DETECTED 11/08/2012 1817   LABBENZ NONE DETECTED 11/08/2012 1817   AMPHETMU NONE DETECTED 11/08/2012 1817   THCU NONE DETECTED 11/08/2012 1817   LABBARB NONE DETECTED 11/08/2012 1817    Urinalysis:  Lab 11/09/12 0005  COLORURINE YELLOW  LABSPEC 1.022  PHURINE 7.0  GLUCOSEU NEGATIVE  HGBUR NEGATIVE  BILIRUBINUR NEGATIVE  KETONESUR 15*  PROTEINUR NEGATIVE  UROBILINOGEN 1.0  NITRITE NEGATIVE  LEUKOCYTESUR TRACE*    Studies/Results: X-ray Chest Pa And Lateral   11/09/2012  *RADIOLOGY REPORT*  Clinical Data: Acute stroke involving the posterior insular cortex yesterday.  History of breast cancer.  CHEST - 2 VIEW  Comparison: CT chest 07/15/2010.  Portable chest x-ray 03/10/2010, 01/25/2010.  Two-view chest x-ray 01/20/2010.  Findings: Cardiomediastinal silhouette unremarkable.  Calcified granuloma in  the right upper lobe, unchanged.  Lungs otherwise clear.  Bronchovascular markings normal.  No pleural effusions. Visualized bony thorax intact apart from slight thoracic scoliosis which is unchanged.  Bilateral mastectomy with bilateral breast prostheses.  IMPRESSION: No acute cardiopulmonary disease.   Original Report Authenticated By: Hulan Saas, M.D.    Medications: I have reviewed the patient's current medications. Scheduled Meds:    . aspirin EC  325 mg Oral Daily  . atorvastatin  40 mg Oral q1800  . escitalopram  20 mg Oral Daily  . pantoprazole  40 mg Oral Q supper  . sodium chloride  3 mL Intravenous Q12H  . [DISCONTINUED] loratadine  10 mg Oral Daily   Continuous Infusions:  PRN Meds:.acetaminophen, cetirizine, ibuprofen, traMADol, zolpidem,  [DISCONTINUED] ibuprofen, [DISCONTINUED] NON FORMULARY 10 mg  Assessment/Plan: Ms. Kutch is a pleasant 46 yo woman with PMH of breast cancer s/p double mastectomy with bilateral reconstruction now with left MCA stroke   Left MCA infarct: MRI of her brain reveal two small infarcts in the posterior division of the MCA, c/w embolic stroke. This may be associated with a hypercoagulable state in the setting of breat cancer although the patient has been on remission. Her MRI also shows very small area of possible hemorrhagic stroke. The patient has been evaluated by Neurology at the ED. Her 2D echo was negative for source of embolism, EF 50% to 55%, mild hypokinesis of the septal myocardium.  -Risk stratification: continued statin for LDL of 101 (goal is <100) Hgb A1C of 5.6  -She may need Holter monitor as outpatient  -Pt has seen and evaluated the patient, no need for further PT at this time Plan: -Continue telemetry  -TEE to be done on Monday, per Dr. Anne Fu in cardiology. Placed order NPO after midnight on Sunday.  -ASA 325mg   -Lipitor 40mg  daily -f/u OT recs -Hold Aromasin in the setting of emboli (rare, <1%, reports of thromboembolism with this medication)  History of breast cancer: breast cancer originally diagnosed 2006 status post TAC chemotherapy x6 followed by radiation on tamoxifen followed by ovarian removal. Recurrent ER positive PR negative breast cancer (new genotype) but now status post bilateral mastectomies on 01/25/2010 with reconstruction with right tissue expander and latissimus dorsi flap on left. Status post one cycle of carboplatin and Gemzar followed by 6 cycles of CMF currently on Aromasin. Per her last Oncologist's note on 08/06/12, she is doing well, and would not benefit from screening mammography due to her bilateral mastectomies. She has tolerated Aromasin well until now.  -Hold Aromasin in the setting of possible hypercoagulable state if no cause of embolism is found.    -Will consult with Dr. Donnie Coffin for possible discontinuation of Aromasin if no cause for her emboli is found.   Depression: She takes Lexapro with PRN Ativan. She has difficulty sleeping, and uses Ativan for sleep. She reports not taking it every night. She has taken Ambien for sleep before but feels like this medicine makes too forgetful in the morning but while in the hospital, would like to take this to help her sleep.  -Ativan 1mg  PRN for anxiety  -Ambien 5mg  PRN for sleep   Headache: mild, similar to her usual headaches at home, likely tension -advil prn -added tramadol prn  Dispo: Hopefully if all goes well, we may be able to discharge patient after TEE tomorrow if prelim read looks negative, otherwise perhaps Tuesday. .  The patient dose have a current PCP Albertina Senegal, MD), therefore will not be  requiring OPC follow-up after discharge.  The patient does not have transportation limitations that hinder transportation to clinic appointments.    LOS: 3 days   Janalyn Harder 11/11/2012, 12:02 PM

## 2012-11-11 NOTE — Progress Notes (Signed)
46 yo RH WF with acute expressive aphasia on 11/28th, MRI showed small acute infarct left posterior insular cortex. Small hemorrhagic infarct left parietal cortex.    She has history of breast cancer originally, diagnosed 2006 status post TAC chemotherapy x6 followed by radiation on tamoxifen followed by ovarian removal. Recurrent ER positive PR negative breast cancer (new genotype) but now status post bilateral mastectomies on 01/25/2010 with reconstruction with right tissue expander and latissimus dorsi flap on left. Status post one cycle of carboplatin and Gemzar followed by 6 cycles of CMF.  She currently on Aromasin.     On exam: 97.9 111/66, HR 71-91.  CV: regular rate rhythm Pulm: clear to ausculation bilaterally.  awake, alert. No dysarthria, no aphasia. CN ii-Xii: normal. Motor: normal tone, bulk and strength. Sensory: intact. DTR: present and symmetric. Cor: normal finger to nose, heel to shin. Gait: steady.  A/P: 46  Yo with history of breast cancer, on Aromasin, now with small stroke in Lt MCA, mechanism of stroke including embolic, also potential medicine side effect.  1. Keep asa. 2. Complete evaluation, including hypercoagulable workup,  TEE pending Monday. 3. the randomized, double-blind, multicenter Intergroup Exemestane (Aromasin) Study (IES), 6 deaths due to stroke occurred in patients receiving 2 to 3 years of exemestane after previously receiving 2 to 3 years of tamoxifen (n=2252) compared with 2 deaths due to stroke in patients receiving 5 years of tamoxifen (n=2280) for the adjuvant treatment of early breast cancer    Aromasin: The principle source of circulating estrogen in postmenopausal women comes from conversion of androstenedione and testosterone (synthesized in the adrenals and ovaries) to estrone and estradiol via the aromatase enzyme. Exemestane acts as a false substrate for the aromatase enzyme, causing an irreversible inhibition. This inhibition results in  estrogen deprivation to hormone dependent breast cancer cells.  4. If no definite etiology found for her stroke, may inform oncologist the potential risk with aromasin.

## 2012-11-11 NOTE — Plan of Care (Signed)
Problem: Phase II Progression Outcomes Goal: Lab tests from Phase I complete Outcome: Completed/Met Date Met:  11/11/12 All initial lab work has been completed and resulted. Goal: Monitor D/C'd if no arrhythmias x 24 hrs Outcome: Progressing Will continue with monitor at least through TEE procedure on Monday.

## 2012-11-11 NOTE — Plan of Care (Signed)
Problem: Phase II Progression Outcomes Goal: Discharge plan established Outcome: Progressing Patient to be discharged to home with follow up appointments set up prior to discharge.

## 2012-11-11 NOTE — Plan of Care (Signed)
Problem: Phase III Progression Outcomes Goal: Neuro status stablized/improved Outcome: Completed/Met Date Met:  11/11/12 Patient has NIH of zero and has no neural deficits noted. Goal: Able to participate in therapies Outcome: Completed/Met Date Met:  11/11/12 Patient is up and ambulating independently and accomplishing her ADLS with set up assist only. Goal: Tolerates activity with minimal fatigue Outcome: Adequate for Discharge Patient is able to engage in activities and rest when needed to conserve energy. Goal: Bowel program established Outcome: Adequate for Discharge Patient bowel patterns are similar to patient patterns at home.

## 2012-11-12 ENCOUNTER — Ambulatory Visit (HOSPITAL_COMMUNITY): Admit: 2012-11-12 | Payer: Self-pay | Admitting: Cardiology

## 2012-11-12 ENCOUNTER — Encounter (HOSPITAL_COMMUNITY)
Admission: EM | Disposition: A | Discharge status: Home / Self Care | Payer: Self-pay | Source: Home / Self Care | Attending: Internal Medicine

## 2012-11-12 ENCOUNTER — Telehealth: Payer: Self-pay | Admitting: *Deleted

## 2012-11-12 ENCOUNTER — Encounter (HOSPITAL_COMMUNITY): Payer: Self-pay

## 2012-11-12 HISTORY — PX: TEE WITHOUT CARDIOVERSION: SHX5443

## 2012-11-12 LAB — COMPREHENSIVE METABOLIC PANEL
AST: 16 U/L (ref 0–37)
Albumin: 3.7 g/dL (ref 3.5–5.2)
Alkaline Phosphatase: 69 U/L (ref 39–117)
BUN: 13 mg/dL (ref 6–23)
Chloride: 104 mEq/L (ref 96–112)
Creatinine, Ser: 0.97 mg/dL (ref 0.50–1.10)
Potassium: 3.8 mEq/L (ref 3.5–5.1)
Total Bilirubin: 0.9 mg/dL (ref 0.3–1.2)
Total Protein: 6.5 g/dL (ref 6.0–8.3)

## 2012-11-12 LAB — CBC
HCT: 41.3 % (ref 36.0–46.0)
Hemoglobin: 13.5 g/dL (ref 12.0–15.0)
MCH: 29.9 pg (ref 26.0–34.0)
MCHC: 32.7 g/dL (ref 30.0–36.0)
MCV: 91.6 fL (ref 78.0–100.0)
Platelets: 175 K/uL (ref 150–400)
RBC: 4.51 MIL/uL (ref 3.87–5.11)
RDW: 12.7 % (ref 11.5–15.5)
WBC: 5.1 K/uL (ref 4.0–10.5)

## 2012-11-12 LAB — BETA-2-GLYCOPROTEIN I ABS, IGG/M/A
Beta-2 Glyco I IgG: 1 G Units (ref <20)
Beta-2-Glycoprotein I IgA: 8 A Units (ref <20)
Beta-2-Glycoprotein I IgM: 1 M Units (ref <20)

## 2012-11-12 LAB — PROTEIN S ACTIVITY: Protein S Activity: 107 % (ref 69–129)

## 2012-11-12 LAB — LUPUS ANTICOAGULANT PANEL: Lupus Anticoagulant: NOT DETECTED

## 2012-11-12 LAB — PROTEIN C ACTIVITY: Protein C Activity: 123 % (ref 75–133)

## 2012-11-12 SURGERY — ECHOCARDIOGRAM, TRANSESOPHAGEAL
Anesthesia: Moderate Sedation

## 2012-11-12 MED ORDER — LIDOCAINE VISCOUS 2 % MT SOLN
OROMUCOSAL | Status: DC | PRN
  Administered 2012-11-12: 20 mL via OROMUCOSAL

## 2012-11-12 MED ORDER — ATORVASTATIN CALCIUM 40 MG PO TABS: 40.0000 mg | ORAL_TABLET | Freq: Every day | ORAL | Status: DC

## 2012-11-12 MED ORDER — FENTANYL CITRATE 0.05 MG/ML IJ SOLN
INTRAMUSCULAR | Status: DC | PRN
  Administered 2012-11-12 (×2): 25 ug via INTRAVENOUS

## 2012-11-12 MED ORDER — ASPIRIN 325 MG PO TBEC: 325.0000 mg | DELAYED_RELEASE_TABLET | Freq: Every day | ORAL | Status: DC

## 2012-11-12 MED ORDER — FENTANYL CITRATE 0.05 MG/ML IJ SOLN
INTRAMUSCULAR | Status: AC
  Filled 2012-11-12: qty 2

## 2012-11-12 MED ORDER — MIDAZOLAM HCL 5 MG/ML IJ SOLN
INTRAMUSCULAR | Status: AC
  Filled 2012-11-12: qty 2

## 2012-11-12 MED ORDER — SODIUM CHLORIDE 0.9 % IV SOLN: INTRAVENOUS | Status: DC

## 2012-11-12 MED ORDER — LIDOCAINE VISCOUS 2 % MT SOLN
OROMUCOSAL | Status: AC
  Filled 2012-11-12: qty 15

## 2012-11-12 MED ORDER — MIDAZOLAM HCL 10 MG/2ML IJ SOLN
INTRAMUSCULAR | Status: DC | PRN
  Administered 2012-11-12 (×2): 2 mg via INTRAVENOUS

## 2012-11-12 NOTE — H&P (View-Only) (Signed)
Subjective:  Ready for TEE. Needed new IV.   Objective:  Vital Signs in the last 24 hours: Temp:  [98 F (36.7 C)-98.4 F (36.9 C)] 98.3 F (36.8 C) (12/02 0934) Pulse Rate:  [71-93] 87  (12/02 0934) Resp:  [16-20] 19  (12/02 0934) BP: (113-145)/(70-86) 121/84 mmHg (12/02 0934) SpO2:  [96 %-98 %] 98 % (12/02 0934)  Intake/Output from previous day: 12/01 0701 - 12/02 0700 In: 3 [I.V.:3] Out: -    Physical Exam: General: Well developed, well nourished, in no acute distress. Head:  Normocephalic and atraumatic. Neurologic: Alert and oriented x 3.    Lab Results:  Basename 11/12/12 0735 11/11/12 0624  WBC 5.1 5.8  HGB 13.5 12.9  PLT 175 187    Basename 11/12/12 0735 11/11/12 0624  NA 142 141  K 3.8 3.7  CL 104 105  CO2 30 26  GLUCOSE 84 92  BUN 13 15  CREATININE 0.97 0.87   Hepatic Function Panel  Basename 11/12/12 0735  PROT 6.5  ALBUMIN 3.7  AST 16  ALT 13  ALKPHOS 69  BILITOT 0.9  BILIDIR --  IBILI --   Assessment/Plan:  Principal Problem:  *History of breast cancer Active Problems:  Stroke   TEE today. NPO.  No further questions.   Kristin Hoffman 11/12/2012, 11:34 AM     

## 2012-11-12 NOTE — CV Procedure (Signed)
TEE  CVA  No embolic source identified. Normal EF Normal valves.  Normal bubble contrast study.No PFO.  Reassuring.

## 2012-11-12 NOTE — Progress Notes (Signed)
Subjective: No acute events overnight. No new complaints today. She is awaiting TEE this afternoon and discharge once stable.  Objective: Vital signs in last 24 hours: Filed Vitals:   11/12/12 1520 11/12/12 1530 11/12/12 1540 11/12/12 1550  BP: 139/73 140/75 137/66 140/73  Pulse:      Temp:      TempSrc:      Resp: 15 51 29 34  Height:      Weight:      SpO2: 98% 98% 97% 96%   Weight change:  No intake or output data in the 24 hours ending 11/13/12 2109 Vitals reviewed. General: resting in bed, in NAD, her husband at her bedside HEENT: PERRL, EOMI, no scleral icterus Cardiac: RRR, no rubs, murmurs or gallops Pulm: clear to auscultation bilaterally, no wheezes, rales, or rhonchi Abd: soft, nontender, nondistended, BS present Ext: warm and well perfused, no pedal edema Neuro: alert and oriented X3, cranial nerves II-XII grossly intact, strength and sensation to light touch equal in bilateral upper and lower extremities. Speech   Lab Results: Basic Metabolic Panel:  Lab 11/12/12 1610 11/11/12 0624  NA 142 141  K 3.8 3.7  CL 104 105  CO2 30 26  GLUCOSE 84 92  BUN 13 15  CREATININE 0.97 0.87  CALCIUM 9.1 8.9  MG -- --  PHOS -- --   Liver Function Tests:  Lab 11/12/12 0735 11/11/12 0624  AST 16 15  ALT 13 13  ALKPHOS 69 63  BILITOT 0.9 0.4  PROT 6.5 6.2  ALBUMIN 3.7 3.4*   CBC:  Lab 11/12/12 0735 11/11/12 0624 11/08/12 1700  WBC 5.1 5.8 --  NEUTROABS -- -- 3.8  HGB 13.5 12.9 --  HCT 41.3 39.7 --  MCV 91.6 90.8 --  PLT 175 187 --   Cardiac Enzymes:  Lab 11/08/12 1700  CKTOTAL --  CKMB --  CKMBINDEX --  TROPONINI <0.30   Hemoglobin A1C:  Lab 11/08/12 1700  HGBA1C 5.6   Fasting Lipid Panel:  Lab 11/08/12 1700  CHOL 170  HDL 45  LDLCALC 101*  TRIG 122  CHOLHDL 3.8  LDLDIRECT --   Coagulation:  Lab 11/10/12 0455 11/08/12 1700  LABPROT 14.0 13.3  INR 1.09 1.02    Urine Drug Screen: Drugs of Abuse     Component Value Date/Time   LABOPIA NONE DETECTED 11/08/2012 1817   COCAINSCRNUR NONE DETECTED 11/08/2012 1817   LABBENZ NONE DETECTED 11/08/2012 1817   AMPHETMU NONE DETECTED 11/08/2012 1817   THCU NONE DETECTED 11/08/2012 1817   LABBARB NONE DETECTED 11/08/2012 1817    Urinalysis:  Lab 11/09/12 0005  COLORURINE YELLOW  LABSPEC 1.022  PHURINE 7.0  GLUCOSEU NEGATIVE  HGBUR NEGATIVE  BILIRUBINUR NEGATIVE  KETONESUR 15*  PROTEINUR NEGATIVE  UROBILINOGEN 1.0  NITRITE NEGATIVE  LEUKOCYTESUR TRACE*    Studies/Results: No results found. Medications: I have reviewed the patient's current medications. Scheduled Meds:    . [DISCONTINUED] aspirin EC  325 mg Oral Daily  . [DISCONTINUED] atorvastatin  40 mg Oral q1800  . [DISCONTINUED] escitalopram  20 mg Oral Daily  . [DISCONTINUED] pantoprazole  40 mg Oral Q supper  . [DISCONTINUED] sodium chloride  3 mL Intravenous Q12H   Continuous Infusions:    . [DISCONTINUED] sodium chloride     PRN Meds:.[DISCONTINUED] acetaminophen, [DISCONTINUED] cetirizine, [DISCONTINUED] ibuprofen, [DISCONTINUED] traMADol, [DISCONTINUED] zolpidem  Assessment/Plan: Ms. Konja is a pleasant 46 yo woman with PMH of breast cancer s/p double mastectomy with bilateral reconstruction now with left MCA stroke  Left MCA infarct: MRI of her brain reveal two small infarcts in the posterior division of the MCA, c/w embolic stroke. This may be associated with a hypercoagulable state in the setting of breat cancer although the patient has been on remission. Her MRI also shows very small area of possible hemorrhagic stroke. The patient has been evaluated by Neurology at the ED. Her 2D echo was negative for source of embolism, EF 50% to 55%, mild hypokinesis of the septal myocardium.  -Risk stratification: continued statin for LDL of 101 (goal is <100) Hgb A1C of 5.6  -She may need Holter monitor as outpatient - to be considered by neurology in conjunction cards as outpatient. -Pt has seen and  evaluated the patient, no need for further PT at this time Plan: -TEE today and if negative, she will be discharged -ASA 325mg   -Lipitor 40mg  daily -f/u OT recs -Hold Aromasin in the setting of emboli (rare, <1%, reports of thromboembolism with this medication). Given her no risk factors for stroke, this is the only culprit we have so far.  - Hypercoagulation tests pending  History of breast cancer: breast cancer originally diagnosed 2006 status post TAC chemotherapy x 6 followed by radiation on tamoxifen followed by ovarian removal. Recurrent ER positive PR negative breast cancer (new genotype) but now status post bilateral mastectomies on 01/25/2010 with reconstruction with right tissue expander and latissimus dorsi flap on left. Status post one cycle of carboplatin and Gemzar followed by 6 cycles of CMF currently on Aromasin. Per her last Oncologist's note on 08/06/12, she is doing well, and would not benefit from screening mammography due to her bilateral mastectomies. She has tolerated Aromasin well until now.  -Hold Aromasin in the setting of possible hypercoagulable state if no cause of embolism is found.  -consult with Dr. Donnie Coffin for possible discontinuation of Aromasin if no cause for her emboli is found. He is out of office but a message has been left with the secretary to contact the patient ASAP for outpatient follow up.  Depression: She takes Lexapro with PRN Ativan. She has difficulty sleeping, and uses Ativan for sleep. She reports not taking it every night. She has taken Ambien for sleep before but feels like this medicine makes too forgetful in the morning but while in the hospital, would like to take this to help her sleep.  -Ativan 1mg  PRN for anxiety  -Ambien 5mg  PRN for sleep   Headache: mild, similar to her usual headaches at home, likely tension -advil prn -added tramadol prn  Dispo: Hopefully if all goes well, we may be able to discharge patient after TEE this afternoon and  then discharge..  The patient dose have a current PCP Albertina Senegal, MD), therefore will not be requiring OPC follow-up after discharge.  The patient does not have transportation limitations that hinder transportation to clinic appointments.    LOS: 4 days   Dow Adolph 11/13/2012, 9:09 PM

## 2012-11-12 NOTE — Progress Notes (Signed)
Subjective:  Ready for TEE. Needed new IV.   Objective:  Vital Signs in the last 24 hours: Temp:  [98 F (36.7 C)-98.4 F (36.9 C)] 98.3 F (36.8 C) (12/02 0934) Pulse Rate:  [71-93] 87  (12/02 0934) Resp:  [16-20] 19  (12/02 0934) BP: (113-145)/(70-86) 121/84 mmHg (12/02 0934) SpO2:  [96 %-98 %] 98 % (12/02 0934)  Intake/Output from previous day: 12/01 0701 - 12/02 0700 In: 3 [I.V.:3] Out: -    Physical Exam: General: Well developed, well nourished, in no acute distress. Head:  Normocephalic and atraumatic. Neurologic: Alert and oriented x 3.    Lab Results:  Basename 11/12/12 0735 11/11/12 0624  WBC 5.1 5.8  HGB 13.5 12.9  PLT 175 187    Basename 11/12/12 0735 11/11/12 0624  NA 142 141  K 3.8 3.7  CL 104 105  CO2 30 26  GLUCOSE 84 92  BUN 13 15  CREATININE 0.97 0.87   Hepatic Function Panel  Basename 11/12/12 0735  PROT 6.5  ALBUMIN 3.7  AST 16  ALT 13  ALKPHOS 69  BILITOT 0.9  BILIDIR --  IBILI --   Assessment/Plan:  Principal Problem:  *History of breast cancer Active Problems:  Stroke   TEE today. NPO.  No further questions.   Nalda Shackleford 11/12/2012, 11:34 AM

## 2012-11-12 NOTE — Discharge Summary (Signed)
Internal Medicine Teaching [facility] Discharge Note  Name: Kristin Hoffman MRN: [id number] DOB: 1966/01/01 46 y.o.  Date of Admission: 11/08/2012  4:51 PM Date of Discharge: 11/12/2012 Attending Physician: Burns Spain, MD  Discharge Diagnosis: 1. Posterior Left Posterior MCA stroke 2. History breast cancer     Discharge Medications:   Medication List     As of 11/12/2012  5:35 PM    STOP taking these medications         exemestane 25 MG tablet   Commonly known as: AROMASIN      LORazepam 1 MG tablet   Commonly known as: ATIVAN      TAKE these medications         aspirin 325 MG EC tablet   Take 1 tablet (325 mg total) by mouth daily.      atorvastatin 40 MG tablet   Commonly known as: LIPITOR   Take 1 tablet (40 mg total) by mouth daily at 6 PM.      cetirizine 10 MG tablet   Commonly known as: ZYRTEC   Take 10 mg by mouth daily.      escitalopram 20 MG tablet   Commonly known as: LEXAPRO   Take 20 mg by mouth daily.      esomeprazole 40 MG capsule   Commonly known as: NEXIUM   Take 40 mg by mouth daily before breakfast.        Disposition and follow-up:   Ms.Ceanna S Woodring was discharged from [facility] in stable condition.  At the hospital follow up visit please address:    Oncology: - Pleased review patient's risk and benefits for taking Aromasin given her stroke.  Follow-up Appointments:     Follow-up Information    Follow up with Gates Rigg, MD. Schedule an appointment as soon as possible for a visit in 2 months.   Contact information:   [address], [address] [facility] [place] [address] [phone]       Follow up with Albertina Senegal, MD. On 12/06/2012. (2:15 pm)    Contact information:   [address] [place] [address] [phone]       Follow up with Pierce Crane, MD. Schedule an appointment as soon as possible for a visit in 1 week.   Contact information:   [address] [place] [address] [phone]         Discharge Orders    Future Appointments: Provider: Department: Dept Phone: Center:   01/31/2013 10:15 AM [facility] MEDICAL ONCOLOGY [phone] None   02/07/2013 11:00 AM Pierce Crane, MD [facility] MEDICAL ONCOLOGY [phone] None     Future Orders Please Complete By Expires   Diet - low sodium heart healthy      Increase activity slowly      Call MD for:      Comments:   Weakness, numbness, tingling, slurred speech, visual changes, or hearing changes.      Consultations: Treatment Team:  Md Stroke, MD  Procedures Performed:  X-ray Chest Pa And Lateral   11/09/2012  *RADIOLOGY REPORT*  Clinical Data: Acute stroke involving the posterior insular cortex yesterday.  History of breast cancer.  CHEST - 2 VIEW  Comparison: CT chest 07/15/2010.  Portable chest x-ray 03/10/2010, 01/25/2010.  Two-view chest x-ray 01/20/2010.  Findings: Cardiomediastinal silhouette unremarkable.  Calcified granuloma in the right upper lobe, unchanged.  Lungs otherwise clear.  Bronchovascular markings  normal.  No pleural effusions. Visualized bony thorax intact apart from slight thoracic scoliosis which is unchanged.  Bilateral mastectomy with bilateral breast prostheses.  IMPRESSION: No acute cardiopulmonary disease.   Original Report Authenticated By: Hulan Saas, M.D.    Ct Head Wo Contrast  11/08/2012  *RADIOLOGY REPORT*  Clinical Data: Headache, aphasia, dizziness  CT HEAD WITHOUT CONTRAST  Technique:  Contiguous axial images were obtained from the base of the skull through the vertex without contrast.  Comparison: None.  Findings: No acute hemorrhage, acute infarction, or mass lesion is identified.  No midline shift.  No ventriculomegaly.  No skull fracture.  Orbits and paranasal sinuses are unremarkable.  IMPRESSION: No acute intracranial finding.   Original Report Authenticated By: Christiana Pellant,  M.D.    Mr Brain Wo Contrast  11/09/2012  *RADIOLOGY REPORT*  Clinical Data:  Expressive aphasia.  TIA.  MRI HEAD WITHOUT CONTRAST MRA HEAD WITHOUT CONTRAST  Technique:  Multiplanar, multiecho pulse sequences of the brain and surrounding structures were obtained without intravenous contrast. Angiographic images of the head were obtained using MRA technique without contrast.  Comparison:  CT head 11/08/2012  MRI HEAD  Findings:  Small area of acute infarction in the posterior insular cortex on the left.  Small area of acute infarct in the left parietal cortex with minimal associated hemorrhage.  No other acute infarct.  Small hyperintensities in the frontal and parietal white matter bilaterally consistent with chronic microvascular ischemia. The brainstem and cerebellum are normal.  Ventricle size is normal.  No midline shift.  Negative for mass lesion.  IMPRESSION: Small acute infarct left posterior insular cortex.  Small hemorrhagic infarct left parietal cortex.  MRA HEAD  Findings: Mild motion on the study.  Both vertebral arteries are patent to the basilar.  PICA is patent bilaterally.  Basilar is widely patent.  The superior cerebellar and posterior cerebral arteries are patent bilaterally without stenosis.  Internal carotid artery is widely patent bilaterally.  The anterior and middle cerebral arteries are patent without significant stenosis.  Negative for cerebral aneurysm.  IMPRESSION: Negative   Original Report Authenticated By: Janeece Riggers, M.D.    Mr Mra Head/brain Wo Cm  11/09/2012  *RADIOLOGY REPORT*  Clinical Data:  Expressive aphasia.  TIA.  MRI HEAD WITHOUT CONTRAST MRA HEAD WITHOUT CONTRAST  Technique:  Multiplanar, multiecho pulse sequences of the brain and surrounding structures were obtained without intravenous contrast. Angiographic images of the head were obtained using MRA technique without contrast.  Comparison:  CT head 11/08/2012  MRI HEAD  Findings:  Small area of acute infarction  in the posterior insular cortex on the left.  Small area of acute infarct in the left parietal cortex with minimal associated hemorrhage.  No other acute infarct.  Small hyperintensities in the frontal and parietal white matter bilaterally consistent with chronic microvascular ischemia. The brainstem and cerebellum are normal.  Ventricle size is normal.  No midline shift.  Negative for mass lesion.  IMPRESSION: Small acute infarct left posterior insular cortex.  Small hemorrhagic infarct left parietal cortex.  MRA HEAD  Findings: Mild motion on the study.  Both vertebral arteries are patent to the basilar.  PICA is patent bilaterally.  Basilar is widely patent.  The superior cerebellar and posterior cerebral arteries are patent bilaterally without stenosis.  Internal carotid artery is widely patent bilaterally.  The anterior and middle cerebral arteries are patent without significant stenosis.  Negative for cerebral aneurysm.  IMPRESSION: Negative   Original Report Authenticated By: Onalee Hua  Chestine Spore, M.D.     2D Echo: Study Conclusions  - Left ventricle: The cavity size was normal. Wall thickness was normal. Systolic function was normal. The estimated ejection fraction was in the range of 60% to 65%. Wall motion was normal; there were no regional wall motion abnormalities. - Aortic valve: No evidence of vegetation. - Mitral valve: No evidence of vegetation. - Left atrium: No evidence of thrombus in the atrial cavity or appendage. No evidence of thrombus in the atrial cavity or appendage. No evidence of thrombus in the appendage. - Right atrium: No evidence of thrombus in the atrial cavity or appendage. - Atrial septum: No defect or patent foramen ovale was identified. Echo contrast study showed no right-to-left atrial level shunt, following an increase in RA pressure induced by provocative maneuvers. - Tricuspid valve: No evidence of vegetation. - Pulmonic valve: No evidence of vegetation. -  Superior vena cava: The study excluded a thrombus. Impressions:  - No cardiac source of emboli was indentified. Recommendations: This procedure has been discussed with the referring physician. Transesophageal echocardiography. 2D and color Doppler. Patient status: Inpatient. Location: Endoscopy.   Admission HPI:  Ms. Stantz is a 46 yo woman with PMH of breast cancer now s/p radical double mastectomy and reconstructive surgery who presented to the [facility] ED as a transfer from the [facility] for evaluation of left MCA stroke. She was visiting her parents and while pushing Christmas ornaments after a Thanksgiving dinner she suddenly felt "lost in space". She was able to walk to the kitchen and to talk to her mom but her words were slurred and her mother could not understand her. She sat down, rested, tried to go to the bathroom but could not remember how to unzip her pants. She was able to communicate to her husband who though that something was not right. Her mother took her blood pressure with a home blood pressure monitor, and it was elevated to ~165/100. At the time she started developing a headache. Her husband drove her to the [facility] ED and she was transferred to the [facility] for further care. She denied loss of consciousness, urinary or bowel incontinence, dizziness, loss of balance, chest pain, calf pain, shortness of breath, or dysuria. Her speech was nonsensical for two hours but by the time she was evaluated in the ED, it had gone back to her baseline and she no longer feels spaced out. She had never experienced similar symptoms in the past. She denies headache, chest pain, abdominal pain, or dysuria.  Physical Exam:  Blood pressure 133/78, pulse 64, temperature 98.2 F (36.8 C), temperature source Oral, resp. rate 18, height 5\' 4"  (1.626 m), weight 190 lb (86.183 kg), SpO2 99.00%.  BP 133/78  Pulse 64  Temp 98.2 F (36.8 C) (Oral)  Resp 18  Ht 5\' 4"  (1.626 m)  Wt 190 lb (86.183 kg)   BMI 32.61 kg/m2  SpO2 99%  General Appearance:  Alert, pleasant, cooperative, no distress, appears stated age   Head:  Normocephalic, without obvious abnormality, atraumatic   Eyes:  PERRL, conjunctiva/corneas clear, EOM's intact,   Ears:  Normal TM's and external ear canals, both ears   Nose:  Nares normal, septum midline, mucosa normal, no drainage   Throat:  Lips, mucosa, and tongue normal; teeth and gums normal   Neck:  Supple, symmetrical, trachea midline, no adenopathy;   Back:  Symmetric, no curvature, ROM normal, no CVA tenderness   Lungs:  Clear to auscultation bilaterally, respirations unlabored  Chest Wall:  No tenderness or deformity   Heart:  Regular rate and rhythm, S1 and S2 normal, no murmur, rub or gallop      Abdomen:  Soft, non-tender, bowel sounds active all four quadrants,  no masses, no organomegaly         Extremities:  Extremities normal, atraumatic, no cyanosis or edema   Pulses:  2+ and symmetric all extremities   Skin:  Skin color, texture, turgor normal, no rashes or lesions      Neurologic:  CNII-XII intact, normal strength, sensation and reflexes  Throughout, follows three step commands appropriately, sensation intact throughout, strength 5/5 in UE and LE.     Hospital Course by problem list:  Left MCA infarct: Ms Lyon's presentation was suggestive of acute stroke even though she did not have obvious risk factors. At the time of presentation to the ED her symptoms had resolved.  MRI of her brain revealed two small infarcts in the posterior division of the MCA, consistent with embolic stroke. This may be associated with a hypercoagulable state in the setting of breat cancer although the patient has been on remission. Her MRI also showed very small area of possible hemorrhagic stroke. The patient has been evaluated was evaluated by neurology on presentation to the ED. Her 2D echo was negative for source of embolism, EF 50% to 55%, mild hypokinesis of the  septal myocardium. She was initiated on statin for LDL of 101 (goal is <100). Hgb A1C of 5.6. TEE did not show cardiac thrombus or PFO. A randomized, double-blind, multicenter Intergroup Exemestane (Aromasin) Study (IES), 6 deaths due to stroke occurred in patients receiving 2 to 3 years of exemestane after previously receiving 2 to 3 years of tamoxifen (n=2252) compared with 2 deaths due to stroke in patients receiving 5 years of tamoxifen (n=2280) for the adjuvant treatment of early breast cancer. This was therefore the main concern regarding her stroke. Aromasin was discontinued and she was encouraged to follow up with her oncologist as outpatient. PT was provided during her hospital stay. She was discharged on Aspirin 325mg  and lipitor 40mg  daily  Hypercoagulation tests pending.  She will follow up with Neurology in 2 months and with PCP. History of breast cancer: Breast cancer originally diagnosed 2006 status post TAC chemotherapy x 6 followed by radiation on tamoxifen followed by ovarian removal. Recurrent ER positive PR negative breast cancer (new genotype) but now status post bilateral mastectomies on 01/25/2010 with reconstruction with right tissue expander and latissimus dorsi flap on left. Status post one cycle of carboplatin and Gemzar followed by 6 cycles of CMF currently on Aromasin. Per her last Oncologist's note on 08/06/12, she is doing well, and would not benefit from screening mammography due to her bilateral mastectomies. She has tolerated Aromasin well until now. Again as already mentioned above Aromasin was held until she is evaluated by oncologist. Depression: She takes Lexapro with PRN Ativan. She has difficulty sleeping, and uses Ativan for sleep. She reports not taking it every night. She has taken Ambien for sleep before but feels like this medicine makes too forgetful in the morning but while in the hospital, would like to take this to help her sleep. Ativan 1mg  PRN for anxiety and  Ambien 5mg  PRN for sleep were given during her hospital stay.  The patient was discharged in a good general condition and she verbalized understanding of her discharge instructions particularly about the importance of withholding Aromasin until she is see by her cancer doctor.  Discharge Vitals:  BP 140/73  Pulse 87  Temp 99.2 F (37.3 C) (Oral)  Resp 34  Ht 5\' 4"  (1.626 m)  Wt 190 lb (86.183 kg)  BMI 32.61 kg/m2  SpO2 96%  Discharge Labs:  Results for orders placed during the hospital encounter of 11/08/12 (from the past 24 hour(s))  CBC     Status: Normal   Collection Time   11/12/12  7:35 AM      Component Value Range   WBC 5.1  4.0 - 10.5 K/uL   RBC 4.51  3.87 - 5.11 MIL/uL   Hemoglobin 13.5  12.0 - 15.0 g/dL   HCT 16.1  09.6 - 04.5 %   MCV 91.6  78.0 - 100.0 fL   MCH 29.9  26.0 - 34.0 pg   MCHC 32.7  30.0 - 36.0 g/dL   RDW 40.9  81.1 - 91.4 %   Platelets 175  150 - 400 K/uL  COMPREHENSIVE METABOLIC PANEL     Status: Abnormal   Collection Time   11/12/12  7:35 AM      Component Value Range   Sodium 142  135 - 145 mEq/L   Potassium 3.8  3.5 - 5.1 mEq/L   Chloride 104  96 - 112 mEq/L   CO2 30  19 - 32 mEq/L   Glucose, Bld 84  70 - 99 mg/dL   BUN 13  6 - 23 mg/dL   Creatinine, Ser 7.82  0.50 - 1.10 mg/dL   Calcium 9.1  8.4 - 95.6 mg/dL   Total Protein 6.5  6.0 - 8.3 g/dL   Albumin 3.7  3.5 - 5.2 g/dL   AST 16  0 - 37 U/L   ALT 13  0 - 35 U/L   Alkaline Phosphatase 69  39 - 117 U/L   Total Bilirubin 0.9  0.3 - 1.2 mg/dL   GFR calc non Af Amer 69 (*) >90 mL/min   GFR calc Af Amer 81 (*) >90 mL/min    Signed: Dow Adolph 11/12/2012, 5:35 PM   Time Spent on Discharge: 30 minutes

## 2012-11-12 NOTE — Progress Notes (Signed)
Stroke Team Progress Note  HISTORY Kristin Hoffman is a 46 y.o. female who had symptoms that started 11/11/2012 around 4 pm. She states that she felt strange, like she was not all there and went to tell her husband, but was unable to get her words out. She could understand, but her words would come out "jumbled". By the time she got to the ER, her symptoms were improving and she was treated as a TIA on a TIA protocol through the ED. She had an MRI done as part of this workup and it shows two small cortical infarcts in the posterior left MCA distribution. Patient was not a TPA candidate secondary to rapidly resolving symptoms. She was admitted for further evaluation and treatment.  SUBJECTIVE No family is at the bedside.  Overall she feels her condition is completely resolved.   OBJECTIVE Most recent Vital Signs: Filed Vitals:   11/11/12 2211 11/12/12 0235 11/12/12 0558 11/12/12 0934  BP: 135/80 113/70 122/86 121/84  Pulse: 79 93 85 87  Temp: 98 F (36.7 C) 98 F (36.7 C) 98.4 F (36.9 C) 98.3 F (36.8 C)  TempSrc: Oral Oral Oral Oral  Resp: 18 18 20 19   Height:      Weight:      SpO2: 98% 96% 96% 98%   CBG (last 3)  No results found for this basename: GLUCAP:3 in the last 72 hours  IV Fluid Intake:     MEDICATIONS    . aspirin EC  325 mg Oral Daily  . atorvastatin  40 mg Oral q1800  . escitalopram  20 mg Oral Daily  . pantoprazole  40 mg Oral Q supper  . sodium chloride  3 mL Intravenous Q12H   PRN:  acetaminophen, cetirizine, ibuprofen, traMADol, zolpidem  Diet:  NPO for TEE Activity:  As tolerated DVT Prophylaxis:  SCDs   CLINICALLY SIGNIFICANT STUDIES Basic Metabolic Panel:  Lab 11/12/12 1610 11/11/12 0624  NA 142 141  K 3.8 3.7  CL 104 105  CO2 30 26  GLUCOSE 84 92  BUN 13 15  CREATININE 0.97 0.87  CALCIUM 9.1 8.9  MG -- --  PHOS -- --   Liver Function Tests:  Lab 11/12/12 0735 11/11/12 0624  AST 16 15  ALT 13 13  ALKPHOS 69 63  BILITOT 0.9 0.4  PROT 6.5  6.2  ALBUMIN 3.7 3.4*   CBC:  Lab 11/12/12 0735 11/11/12 0624 11/08/12 1700  WBC 5.1 5.8 --  NEUTROABS -- -- 3.8  HGB 13.5 12.9 --  HCT 41.3 39.7 --  MCV 91.6 90.8 --  PLT 175 187 --   Coagulation:  Lab 11/10/12 0455 11/08/12 1700  LABPROT 14.0 13.3  INR 1.09 1.02   Cardiac Enzymes:  Lab 11/08/12 1700  CKTOTAL --  CKMB --  CKMBINDEX --  TROPONINI <0.30   Urinalysis:  Lab 11/09/12 0005  COLORURINE YELLOW  LABSPEC 1.022  PHURINE 7.0  GLUCOSEU NEGATIVE  HGBUR NEGATIVE  BILIRUBINUR NEGATIVE  KETONESUR 15*  PROTEINUR NEGATIVE  UROBILINOGEN 1.0  NITRITE NEGATIVE  LEUKOCYTESUR TRACE*   Lipid Panel    Component Value Date/Time   CHOL 170 11/08/2012 1700   TRIG 122 11/08/2012 1700   HDL 45 11/08/2012 1700   CHOLHDL 3.8 11/08/2012 1700   VLDL 24 11/08/2012 1700   LDLCALC 101* 11/08/2012 1700   HgbA1C  Lab Results  Component Value Date   HGBA1C 5.6 11/08/2012    Urine Drug Screen:     Component Value Date/Time  LABOPIA NONE DETECTED 11/08/2012 1817   COCAINSCRNUR NONE DETECTED 11/08/2012 1817   LABBENZ NONE DETECTED 11/08/2012 1817   AMPHETMU NONE DETECTED 11/08/2012 1817   THCU NONE DETECTED 11/08/2012 1817   LABBARB NONE DETECTED 11/08/2012 1817    Alcohol Level: No results found for this basename: ETH:2 in the last 168 hours  CT of the brain  No acute intracranial finding.  MRI of the brain  Small acute infarct left posterior insular cortex.  Small hemorrhagic infarct left parietal cortex.  MRA of the brain  Negative  2D Echocardiogram  EF 55-60% with no source of embolus. Mild hypokinesis of the septal myocardium.  TEE  Carotid Doppler Right = No significant extracranial carotid artery stenosis demonstrated (<40%).  Left = Proximal internal carotid artery demonstrates slightly increased velocities into the 40-59% range, however this is most likely due to tortuosity as there is no plaque formation to support stenosis. Bilateral Vertebrals are patent  with antegrade flow.  LE Venous Doppler Bilateral: No evidence of DVT, superficial thrombosis, or Baker's Cyst.   CXR  No acute cardiopulmonary disease.    EKG  normal EKG, normal sinus rhythm.   Therapy Recommendations PT - none; OT - none  Physical Exam  CV: regular rate rhythm  Pulm: clear to ausculation bilaterally.  awake, alert. No dysarthria, no aphasia.  CN ii-Xii: normal.  Motor: normal tone, bulk and strength. Sensory: intact. DTR: present and symmetric. Cor: normal finger to nose, heel to shin. Gait: steady.  ASSESSMENT Kristin Hoffman is a 46 y.o. female with history of breast cancer presenting with expressive aphasia .  Imaging confirms bilateral small infarcts. Infarct felt to be  embolic secondary to unknown etiology.  Work up underway. unlikely stroke is associated with either cancer or ongoing chemotherapy (though potentially could be hypercoagulable from aromasin, IM addressing with oncologist Dr. Donnie Coffin). On no antiplatelets prior to admission. Now on aspirin 325 mg orally every day for secondary stroke prevention. Patient with no resultant neuro symptoms.  Hyperlipidemia, LDL 101, not on statin PTA, now on lipitor , goal LDL < 100 Breast cancer  Hospital day # 4  TREATMENT/PLAN  Continue aspirin 325 mg orally every day for secondary stroke prevention. TEE today to look for embolic source.  Agree with holding aromasin and discussing with oncologist Ok from neuro standpoint for discharge following TEE Follow up with Dr. Pearlean Brownie, Stroke Clinic, in 2 months.  Annie Main, MSN, RN, ANVP-BC, ANP-BC, GNP-BC [facility] Pager: [phone] 11/12/2012 11:56 AM   I have personally examined this patient,reviewed pertinent data and developed plan of care. I agree with above  Delia Heady, MD Medical Director [facility] Pager: [phone] 11/12/2012 7:46 PM

## 2012-11-12 NOTE — Telephone Encounter (Signed)
Dr. Zada Girt called to make an appointment for this patient.  Being discharged today s/p stroke.  Patient has been on aromasin for six months and still taking this medicine.  There is no etiology or cause for the stroke.  She is taking Patient needs to see Dr. Donnie Coffin to determine how to proceed with this medicine.  Will notify MD of this request.

## 2012-11-12 NOTE — Interval H&P Note (Signed)
History and Physical Interval Note:  11/12/2012 3:01 PM  Kristin Hoffman  has presented today for surgery, with the diagnosis of CVA  The various methods of treatment have been discussed with the patient and family. After consideration of risks, benefits and other options for treatment, the patient has consented to  Procedure(s) (LRB) with comments: TRANSESOPHAGEAL ECHOCARDIOGRAM (TEE) (N/A) - This TEE may be Dr. Anne Fu or a LHC Dr as a surgical intervention .  The patient's history has been reviewed, patient examined, no change in status, stable for surgery.  I have reviewed the patient's chart and labs.  Questions were answered to the patient's satisfaction.     Kristin Hoffman

## 2012-11-13 ENCOUNTER — Telehealth: Payer: Self-pay | Admitting: *Deleted

## 2012-11-13 ENCOUNTER — Encounter (HOSPITAL_COMMUNITY): Payer: Self-pay | Admitting: Cardiology

## 2012-11-13 LAB — PROTEIN C, TOTAL: Protein C, Total: 56 % — ABNORMAL LOW (ref 72–160)

## 2012-11-13 NOTE — Telephone Encounter (Signed)
Per MD review of call from yesterday recommended for pt tp stop Aromasin and schedule for appointment.  Called and spoke with pt husband and scheduled appointment for tomorrow.

## 2012-11-13 NOTE — Progress Notes (Signed)
Retro ur ins review. 

## 2012-11-14 ENCOUNTER — Other Ambulatory Visit (HOSPITAL_BASED_OUTPATIENT_CLINIC_OR_DEPARTMENT_OTHER): Payer: BC Managed Care – PPO

## 2012-11-14 ENCOUNTER — Ambulatory Visit (HOSPITAL_BASED_OUTPATIENT_CLINIC_OR_DEPARTMENT_OTHER): Payer: BC Managed Care – PPO | Admitting: Oncology

## 2012-11-14 VITALS — BP 126/80 | HR 73 | Temp 98.4°F | Resp 20 | Ht 64.0 in | Wt 196.9 lb

## 2012-11-14 DIAGNOSIS — Z853 Personal history of malignant neoplasm of breast: Secondary | ICD-10-CM

## 2012-11-14 DIAGNOSIS — Z8673 Personal history of transient ischemic attack (TIA), and cerebral infarction without residual deficits: Secondary | ICD-10-CM

## 2012-11-14 DIAGNOSIS — C50319 Malignant neoplasm of lower-inner quadrant of unspecified female breast: Secondary | ICD-10-CM

## 2012-11-14 DIAGNOSIS — E559 Vitamin D deficiency, unspecified: Secondary | ICD-10-CM

## 2012-11-14 DIAGNOSIS — I639 Cerebral infarction, unspecified: Secondary | ICD-10-CM

## 2012-11-14 DIAGNOSIS — C50919 Malignant neoplasm of unspecified site of unspecified female breast: Secondary | ICD-10-CM

## 2012-11-14 DIAGNOSIS — Z170 Estrogen receptor positive status [ER+]: Secondary | ICD-10-CM

## 2012-11-14 LAB — CBC WITH DIFFERENTIAL/PLATELET
BASO%: 0.4 % (ref 0.0–2.0)
Eosinophils Absolute: 0.1 10*3/uL (ref 0.0–0.5)
HGB: 13.2 g/dL (ref 11.6–15.9)
MCHC: 33.3 g/dL (ref 31.5–36.0)
MONO#: 0.3 10*3/uL (ref 0.1–0.9)
NEUT%: 70.4 % (ref 38.4–76.8)
Platelets: 186 10*3/uL (ref 145–400)
RBC: 4.35 10*6/uL (ref 3.70–5.45)
lymph#: 1.1 10*3/uL (ref 0.9–3.3)

## 2012-11-14 LAB — COMPREHENSIVE METABOLIC PANEL (CC13)
ALT: 19 U/L (ref 0–55)
AST: 22 U/L (ref 5–34)
Albumin: 3.8 g/dL (ref 3.5–5.0)
Alkaline Phosphatase: 78 U/L (ref 40–150)
BUN: 14 mg/dL (ref 7.0–26.0)
CO2: 25 mEq/L (ref 22–29)
Calcium: 9.2 mg/dL (ref 8.4–10.4)
Chloride: 104 mEq/L (ref 98–107)
Creatinine: 1 mg/dL (ref 0.6–1.1)
Glucose: 108 mg/dl — ABNORMAL HIGH (ref 70–99)
Potassium: 3.8 mEq/L (ref 3.5–5.1)
Sodium: 140 mEq/L (ref 136–145)
Total Bilirubin: 1.16 mg/dL (ref 0.20–1.20)
Total Protein: 6.9 g/dL (ref 6.4–8.3)

## 2012-11-14 MED ORDER — LORAZEPAM 0.5 MG PO TABS: 1.0000 mg | ORAL_TABLET | Freq: Three times a day (TID) | ORAL | Status: DC

## 2012-11-14 NOTE — Progress Notes (Signed)
Hematology and Oncology Follow Up Visit  Kristin Hoffman [id number] [birth year] 46 y.o. 11/14/2012 3:15 PM   Principle Diagnosis: Vital 46 year old woman with history of breast cancer originally diagnosed 2006 status post TAC chemotherapy x6 followed by radiation on tamoxifen followed by ovarian removal. Recurrent you're positive your negative breast cancer status post bilateral mastectomies 01/25/2010 with reconstruction right tissue expander and latissimus dorsi flap on left. Status post one cycle of carboplatin and Gemzar followed by 6 cycles of CMF currently on Aromasin   Interim History:  She was hospitalized on 11/28 with a small cva affectin her speech. She had an extensive w/u which did not reveal a source. aromasin was stopped. She has had a complete recovery. Brain MRA did show a small clot in the lt posterior insular cortex.  She denies any other problems at, pain etc. She is also on lipitor   As well as a result despite a fairly normal cholesterol. Medications: I have reviewed the patient's current medications.  Allergies: No Known Allergies  Past Medical History, Surgical history, Social history, and Family History were reviewed and updated.  Review of Systems: Constitutional:  Negative for fever, chills, night sweats, anorexia, weight loss, pain. Cardiovascular: no chest pain or dyspnea on exertion Respiratory: no cough, shortness of breath, or wheezing Neurological: no TIA or stroke symptoms Dermatological: negative ENT: negative Skin Gastrointestinal: no abdominal pain, change in bowel habits, or black or bloody stools Genito-Urinary: no dysuria, trouble voiding, or hematuria Hematological and Lymphatic: negative Breast: negative for breast lumps Musculoskeletal: negative Remaining ROS negative.  Physical Exam: Blood pressure 126/80, pulse 73, temperature 98.4 F (36.9 C), resp. rate 20, height 5\' 4"  (1.626 m), weight 196 lb 14.4 oz (89.313 kg). ECOG: 0 General  appearance: alert, cooperative and appears stated age Head: Normocephalic, without obvious abnormality, atraumatic Neck: no adenopathy, no carotid bruit, no JVD, supple, symmetrical, trachea midline and thyroid not enlarged, symmetric, no tenderness/mass/nodules Lymph nodes: Cervical, supraclavicular, and axillary nodes normal. Cardiac : Normal heart sounds  Pulmonary:Normal breasts  Breasts:Right and left expander and flap inspection did not reveal any obvious evidence of recurrent  Abdomen:Normal  Extremities Normal  Neuro: Normal  Lab Results: Lab Results  Component Value Date   WBC 5.1 11/14/2012   HGB 13.2 11/14/2012   HCT 39.6 11/14/2012   MCV 91.2 11/14/2012   PLT 186 11/14/2012     Chemistry      Component Value Date/Time   NA 140 11/14/2012 1407   NA 142 11/12/2012 0735   K 3.8 11/14/2012 1407   K 3.8 11/12/2012 0735   CL 104 11/14/2012 1407   CL 104 11/12/2012 0735   CO2 25 11/14/2012 1407   CO2 30 11/12/2012 0735   BUN 14.0 11/14/2012 1407   BUN 13 11/12/2012 0735   CREATININE 1.0 11/14/2012 1407   CREATININE 0.97 11/12/2012 0735      Component Value Date/Time   CALCIUM 9.2 11/14/2012 1407   CALCIUM 9.1 11/12/2012 0735   ALKPHOS 78 11/14/2012 1407   ALKPHOS 69 11/12/2012 0735   AST 22 11/14/2012 1407   AST 16 11/12/2012 0735   ALT 19 11/14/2012 1407   ALT 13 11/12/2012 0735   BILITOT 1.16 11/14/2012 1407   BILITOT 0.9 11/12/2012 0735       Radiological Studies: chest X-ray n/a Mammogram n/a Bone density n/a  Impression and Plan: Young woman with history of recurrent breast cancer with new phenotype in contralateral side. She had a recent CVA which has resolved.  I discussed stopping aromasin . She will also have a staging PET scan and hypercoag w/u . I will see her back in 3 months. The role of AI in vasculopathy is questionable but can be associated with increased heart attacks and possibly strokes.  More than 50% of the visit was spent in patient-related  counselling   Pierce Crane, MD 12/4/20133:15 PM

## 2012-11-15 LAB — CANCER ANTIGEN 27.29: CA 27.29: 18 U/mL (ref 0–39)

## 2012-11-15 MED ORDER — DIPHENHYDRAMINE HCL 50 MG/ML IJ SOLN
INTRAMUSCULAR | Status: AC
  Filled 2012-11-15: qty 1

## 2012-11-23 ENCOUNTER — Encounter (HOSPITAL_COMMUNITY)
Admission: RE | Admit: 2012-11-23 | Discharge: 2012-11-23 | Disposition: A | Discharge status: Home / Self Care | Payer: BC Managed Care – PPO | Source: Ambulatory Visit | Attending: Oncology | Admitting: Oncology

## 2012-11-23 DIAGNOSIS — I639 Cerebral infarction, unspecified: Secondary | ICD-10-CM

## 2012-11-23 DIAGNOSIS — Z853 Personal history of malignant neoplasm of breast: Secondary | ICD-10-CM | POA: Insufficient documentation

## 2012-11-23 DIAGNOSIS — I6350 Cerebral infarction due to unspecified occlusion or stenosis of unspecified cerebral artery: Secondary | ICD-10-CM | POA: Insufficient documentation

## 2012-11-23 MED ORDER — FLUDEOXYGLUCOSE F - 18 (FDG) INJECTION
15.2000 | Freq: Once | INTRAVENOUS | Status: AC | PRN
  Administered 2012-11-23: 15.2 via INTRAVENOUS

## 2013-01-18 ENCOUNTER — Telehealth: Payer: Self-pay | Admitting: *Deleted

## 2013-01-18 NOTE — Telephone Encounter (Signed)
Left vm for pt to return call to r/s f/u appt. 

## 2013-01-31 ENCOUNTER — Other Ambulatory Visit: Payer: BC Managed Care – PPO | Admitting: Lab

## 2013-02-01 ENCOUNTER — Telehealth: Payer: Self-pay | Admitting: *Deleted

## 2013-02-01 NOTE — Telephone Encounter (Signed)
Left message for a return phone call to reschedule patient's appt. 

## 2013-02-05 ENCOUNTER — Encounter: Payer: Self-pay | Admitting: *Deleted

## 2013-02-05 NOTE — Progress Notes (Signed)
Awaiting patient response, I will cancel her appts.

## 2013-02-05 NOTE — Progress Notes (Signed)
Message left, Letter mailed, Awaiting pt call.

## 2013-02-07 ENCOUNTER — Ambulatory Visit: Payer: BC Managed Care – PPO | Admitting: Oncology

## 2013-02-28 ENCOUNTER — Other Ambulatory Visit: Payer: BC Managed Care – PPO

## 2013-02-28 ENCOUNTER — Ambulatory Visit: Payer: BC Managed Care – PPO | Admitting: Oncology

## 2013-03-11 ENCOUNTER — Other Ambulatory Visit (HOSPITAL_COMMUNITY): Payer: Self-pay | Admitting: Internal Medicine

## 2013-06-25 ENCOUNTER — Telehealth: Payer: Self-pay | Admitting: *Deleted

## 2013-06-25 NOTE — Telephone Encounter (Signed)
Left message for a return phone call to reschedule appt.  Awaiting patient repsonse.

## 2013-07-12 ENCOUNTER — Encounter: Payer: Self-pay | Admitting: *Deleted

## 2013-07-12 NOTE — Progress Notes (Signed)
Mailed letter to request pt to call and schedule appt with new provider. 

## 2013-08-01 ENCOUNTER — Telehealth: Payer: Self-pay | Admitting: *Deleted

## 2013-08-01 DIAGNOSIS — Z853 Personal history of malignant neoplasm of breast: Secondary | ICD-10-CM

## 2013-08-01 NOTE — Telephone Encounter (Signed)
Received message from patient that she has been struggling on coming back here and seeing a new provider but she states she is finally ready.    Returned her phone call and left message for a return phone to schedule an appt.

## 2013-08-01 NOTE — Telephone Encounter (Signed)
Received a return phone call from patient and confirmed appt with Amy Berry,PA 08/20/13 at 1245 labs and 115 with Amy Berry per patient request.  She has been struggling coming back here since Dr. Donnie Coffin is not here and she has seen Amy in the past and would like to see her again.    Mailed appt. Letter and calendar.

## 2013-08-20 ENCOUNTER — Ambulatory Visit (HOSPITAL_COMMUNITY)
Admission: RE | Admit: 2013-08-20 | Discharge: 2013-08-20 | Disposition: A | Discharge status: Home / Self Care | Payer: BC Managed Care – PPO | Source: Ambulatory Visit | Attending: Physician Assistant | Admitting: Physician Assistant

## 2013-08-20 ENCOUNTER — Ambulatory Visit: Payer: BC Managed Care – PPO | Admitting: Lab

## 2013-08-20 ENCOUNTER — Encounter: Payer: Self-pay | Admitting: Physician Assistant

## 2013-08-20 ENCOUNTER — Other Ambulatory Visit (HOSPITAL_BASED_OUTPATIENT_CLINIC_OR_DEPARTMENT_OTHER): Payer: BC Managed Care – PPO | Admitting: Lab

## 2013-08-20 ENCOUNTER — Telehealth: Payer: Self-pay | Admitting: *Deleted

## 2013-08-20 ENCOUNTER — Ambulatory Visit (HOSPITAL_BASED_OUTPATIENT_CLINIC_OR_DEPARTMENT_OTHER): Payer: BC Managed Care – PPO | Admitting: Physician Assistant

## 2013-08-20 VITALS — BP 131/84 | HR 72 | Temp 98.7°F | Resp 20 | Ht 64.0 in | Wt 193.1 lb

## 2013-08-20 DIAGNOSIS — I639 Cerebral infarction, unspecified: Secondary | ICD-10-CM

## 2013-08-20 DIAGNOSIS — Z8673 Personal history of transient ischemic attack (TIA), and cerebral infarction without residual deficits: Secondary | ICD-10-CM

## 2013-08-20 DIAGNOSIS — C50319 Malignant neoplasm of lower-inner quadrant of unspecified female breast: Secondary | ICD-10-CM

## 2013-08-20 DIAGNOSIS — Z853 Personal history of malignant neoplasm of breast: Secondary | ICD-10-CM

## 2013-08-20 DIAGNOSIS — R51 Headache: Secondary | ICD-10-CM | POA: Insufficient documentation

## 2013-08-20 DIAGNOSIS — R519 Headache, unspecified: Secondary | ICD-10-CM

## 2013-08-20 LAB — COMPREHENSIVE METABOLIC PANEL (CC13)
Alkaline Phosphatase: 57 U/L (ref 40–150)
Glucose: 97 mg/dl (ref 70–140)
Sodium: 142 mEq/L (ref 136–145)
Total Bilirubin: 0.57 mg/dL (ref 0.20–1.20)
Total Protein: 6.7 g/dL (ref 6.4–8.3)

## 2013-08-20 LAB — CBC WITH DIFFERENTIAL/PLATELET
BASO%: 0.8 % (ref 0.0–2.0)
EOS%: 1.6 % (ref 0.0–7.0)
Eosinophils Absolute: 0.1 10*3/uL (ref 0.0–0.5)
LYMPH%: 25.9 % (ref 14.0–49.7)
MCH: 30.7 pg (ref 25.1–34.0)
MCHC: 33.7 g/dL (ref 31.5–36.0)
MCV: 90.8 fL (ref 79.5–101.0)
MONO%: 6.5 % (ref 0.0–14.0)
Platelets: 195 10*3/uL (ref 145–400)
RBC: 4.38 10*6/uL (ref 3.70–5.45)

## 2013-08-20 MED ORDER — GADOBENATE DIMEGLUMINE 529 MG/ML IV SOLN
18.0000 mL | Freq: Once | INTRAVENOUS | Status: AC | PRN
  Administered 2013-08-20: 18 mL via INTRAVENOUS

## 2013-08-20 NOTE — Progress Notes (Addendum)
ID: Kristin Hoffman OB: [birth year]  MR#: [id number]  WUJ#:[id number]  PCP: Albertina Senegal, MD GYN:  Harold Hedge SU: Cyndia Bent OTHER MD: Margaretmary Dys   HISTORY OF PRESENT ILLNESS: This patient was previously followed by Dr. Pierce Crane, and is transferred to Dr. Darrall Dears service as of 08/20/2013.  At the age of 55, the patient had a screening mammogram as a baseline. She had recently given birth and was on birth control pills at that time. The mammogram in November 2006 showed an area of architectural distortion in the left lower inner quadrant. Subsequently an ultrasound was obtained of the left breast showing a 2.0 x 1.5 x 1.4 cm mass, at the 8:00 position, 4 cm from the nipple. A core biopsy performed on 10/21/2005 showed an invasive mammary carcinoma, ER +70%, PR +41%, HER-2/neu negative, with proliferation marker of 38%. [phone]  Breast MRI on 11/08/2005 confirmed a 2.5 cm spiculated mass in the left lower inner quadrant with 3 small satellite nodules adjacent to the primary mass: 3.5 cm posterior medial to the primary mass, measuring 9 mm; 1.5 cm anterior medial to the primary mass measuring 5 mm; and 2 cm medial to the primary mass measuring 5 mm. No axillary adenopathy was noted. No suspicious masses or enhancement are noted in the right breast.  Kristin Hoffman underwentt left lumpectomy under the care of Dr. 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 2 of 23 lymph nodes were involved.  Margins were clear.  She received adjuvant chemotherapy consisting of 6 q. three-week doses of docetaxel/doxorubicin/cyclophosphamide given between January 2007 and may of 2007, with last dose on 04/20/2006. She underwent radiation therapy completed 07/11/2006, after which she began on tamoxifen in early August 2007.  She underwent hysterectomy with bilateral salpingo-oophorectomy on 07/15/2008, and was  started on letrozole in October of 2009.  A mammogram 11/10/2009 showed microcalcifications in the left breast, and a subsequent biopsy on 11/18/2009 confirmed invasive mammary carcinoma in the left breast. PET and CT of the chest showed no evidence of metastasis in January 2011.  She underwent bilateral mastectomies 01/25/2010, with the right breast showing no evidence of malignancy; in the left breast showing a 1.0 cm grade 2 invasive ductal carcinoma with high-grade DCIS. Tumor was ER +22%, PR negative, HER-2/neu negative, with proliferation marker of 13%. Margins were clear. Patient underwent concurrent left latissimus flap reconstruction and right implant reconstruction.  She received additional chemotherapy with one cycle of carboplatin/gemcitabine given on 03/11/2010. She then received 5 q. three-week cycles of IV CMF between 03/25/2010 and 06/17/2010.  She was started on exemestane in January 2012 and continued until late November 2013 when she was hospitalized for apparent TIA.    INTERVAL HISTORY: Kristin Hoffman is seen in the office today for followup of her left breast carcinoma, transferring her care from Dr. Gita Kudo service to Dr. Darrall Dears service.  Overall, Kristin Hoffman has been doing well. 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 They took several trips over the summer which she enjoyed.  She seems to have recovered well from her TIA in November of last year. Of concern today, however, is the fact that she has had a worsening headache now for 4 days. She's been taking ibuprofen which usually takes care of her headaches, but this has given her no relief at all. She denies any changes in vision and has had no dizziness, seizure activity, or  loss of consciousness. No gait disturbance. She also denies any nausea or emesis. She's noticed no changes in her speech pattern.  REVIEW OF SYSTEMS: Kristin Hoffman has had no recent illnesses and denies any fevers, chills, or night sweats. She's had no  significant hot flashes. She denies any skin changes, rashes, abnormal bruising, or abnormal bleeding. She's had no known blood clots. Her appetite is good and she denies any nausea or change in bowel habits. She's had no cough, shortness of breath, or chest pain. She has occasional anxiety. She denies any unusual myalgias, arthralgias, bony pain, peripheral swelling, peripheral neuropathy, or peripheral weakness.  A detailed review of systems is otherwise stable and noncontributory.   PAST MEDICAL HISTORY: Past Medical History  Diagnosis Date  . Breast cancer   . Depression   . Reflux     PAST SURGICAL HISTORY: Past Surgical History  Procedure Laterality Date  . Mastectomy w/ nodes partial    . Mastectomy    . Breast reconstruction    . Portacath placement      x 2  . Portacath removal      x 2  . Abdominal hysterectomy    . Tee without cardioversion  11/12/2012    Procedure: TRANSESOPHAGEAL ECHOCARDIOGRAM (TEE);  Surgeon: Donato Schultz, MD;  Location: [facility] ENDOSCOPY;  Service: Cardiovascular;  Laterality: N/A;  This TEE may be Dr. Anne Fu or a LHC Dr    FAMILY HISTORY Both parents are alive and well. Patient has one sister who is 10 years younger and is in good health. No other history of breast or ovarian cancer in the family. History reviewed. No pertinent family history.  GYNECOLOGIC HISTORY:  G2P2, menarche at age 23 with irregular menses. On birth control pills in the past. On Clomid to induce ovulation with first pregnancy. Also had preeclampsia with first pregnancy. Status post hysterectomy and bilateral salpingo-oophorectomy in August 2009.  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 She is originally from [place]. She's been married to her husband, Kristin Hoffman, for 13 years. He works as an Multimedia programmer at [employer].   ADVANCED DIRECTIVES:    HEALTH MAINTENANCE: History  Substance Use Topics  .  Smoking status: Never Smoker   . Smokeless tobacco: Never Used  . Alcohol Use: Yes     Comment: rarely     Colonoscopy:  PAP: S/P LAVH/BSO in August 2009  Bone density:  Lipid panel:  No Known Allergies  Current Outpatient Prescriptions  Medication Sig Dispense Refill  . aspirin EC 325 MG EC tablet Take 1 tablet (325 mg total) by mouth daily.  30 tablet  1  . cetirizine (ZYRTEC) 10 MG tablet Take 10 mg by mouth daily.        . cholecalciferol (VITAMIN D) 400 UNITS TABS tablet Take 1,000 Units by mouth daily.      Marland Kitchen escitalopram (LEXAPRO) 20 MG tablet Take 20 mg by mouth daily.        Marland Kitchen LORazepam (ATIVAN) 0.5 MG tablet Take 2 tablets (1 mg total) by mouth every 8 (eight) hours.  60 tablet  3  . Multiple Vitamin (MULTIVITAMIN) tablet Take 1 tablet by mouth daily.      Marland Kitchen omeprazole (PRILOSEC) 40 MG capsule       . zolpidem (AMBIEN) 10 MG tablet       . atorvastatin (LIPITOR) 40 MG tablet Take 1 tablet (40 mg total) by mouth daily at  6 PM.  30 tablet  1   No current facility-administered medications for this visit.    OBJECTIVE: Young Caucasian female who appears comfortable and is in no acute distress Filed Vitals:   08/20/13 1421  BP: 131/84  Pulse: 72  Temp: 98.7 F (37.1 C)  Resp: 20     Body mass index is 33.13 kg/(m^2).    ECOG FS: 0 Filed Weights   08/20/13 1421  Weight: 193 lb 1.6 oz (87.59 kg)   Sclerae unicteric, PERRLA, EOM intact Oropharynx clear No cervical or supraclavicular adenopathy Lungs clear to auscultation bilaterally, no wheezes, no rales or rhonchi Heart regular rate and rhythm, no murmur appreciated Abd soft, nontender to palpation, positive bowel sounds MSK no focal spinal tenderness, no peripheral edema Neuro: non-focal, well-oriented, friendly cranial nerves grossly intact, affect Breasts: Patient status post right mastectomy with implant reconstruction. Also status post left mastectomy with latissimus flap reconstruction, no nodularity or  evidence of local recurrence. Axillae are benign bilaterally with no palpable adenopathy noted.   LAB RESULTS:  Lab Results  Component Value Date   WBC 5.2 08/20/2013   NEUTROABS 3.4 08/20/2013   HGB 13.4 08/20/2013   HCT 39.8 08/20/2013   MCV 90.8 08/20/2013   PLT 195 08/20/2013      Chemistry      Component Value Date/Time   NA 142 08/20/2013 1247   NA 142 11/12/2012 0735   K 3.9 08/20/2013 1247   K 3.8 11/12/2012 0735   CL 104 11/14/2012 1407   CL 104 11/12/2012 0735   CO2 28 08/20/2013 1247   CO2 30 11/12/2012 0735   BUN 10.2 08/20/2013 1247   BUN 13 11/12/2012 0735   CREATININE 0.9 08/20/2013 1247   CREATININE 0.97 11/12/2012 0735      Component Value Date/Time   CALCIUM 8.9 08/20/2013 1247   CALCIUM 9.1 11/12/2012 0735   ALKPHOS 57 08/20/2013 1247   ALKPHOS 69 11/12/2012 0735   AST 24 08/20/2013 1247   AST 16 11/12/2012 0735   ALT 13 08/20/2013 1247   ALT 13 11/12/2012 0735   BILITOT 0.57 08/20/2013 1247   BILITOT 0.9 11/12/2012 0735      STUDIES: No results found.    ASSESSMENT: 47 y.o. Kristin Hoffman woman  (1)  status post left lumpectomy under the care of Dr. 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 2 of 23 lymph nodes were involved.  Margins were clear.  (2) status post adjuvant chemotherapy consisting of 6 q. three-week doses of docetaxel/doxorubicin/cyclophosphamide given between January 2007 and may of 2007, with last dose on 04/20/2006.  (3) status post radiation therapy to the left breast, completed 07/11/2006  (4) began on tamoxifen in early August 2007 and continued until October 2009. She status post hysterectomy with bilateral salpingo-oophorectomy on 07/15/2008, and was started on letrozole in October of 2009.  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 PET and CT  of the chest showed no evidence of metastasis in January 2011.  (6) status post bilateral mastectomies 01/25/2010, with the right breast showing no evidence of malignancy; in the left breast showing a 1.0 cm grade 2 invasive ductal carcinoma with high-grade DCIS. Tumor was ER +22%, PR negative, HER-2/neu negative, with proliferation marker of 13%. Margins were clear. Patient underwent concurrent left latissimus flap reconstruction and  right implant reconstruction.  (7)  status post additional chemotherapy with one cycle of carboplatin/gemcitabine given on 03/11/2010. She then received 5 q. three-week cycles of IV CMF between 03/25/2010 and 06/17/2010.  (8) started on exemestane in January 2012 and continued until late November 2013 when she was hospitalized for apparent TIA. There is apparently some question of whether or not the antiestrogen therapy could have contributed to this development, and she has not resumed antiestrogen therapy since November 2013.   PLAN: Dr. Darnelle Catalan also spoke with this patient today. Overall, she seems to be doing very well with regards to her breast cancer. He is comfortable not resuming an antiestrogen therapy at this time, and we will simply continue to follow Kristin Hoffman with observation.  She is being scheduled for a chest CT in December, and I will see her in thereafter for routine followup. At that time we will likely go to annual visits.  With regards to her history of TIA in November 2013, we are going to obtain a coagulopathy panel today for further evaluation. Due to her history, we are also concerned about her increased headaches, and will obtain a brain MRI as soon as possible for further evaluation. She has not yet seen a neurologist for followup, and we will also schedule her for the next available appointment with Dr. Porfirio Mylar Dohmeier.  Ethelle is already scheduled for her routine physical with Dr. Albertina Senegal in [facility] in November.  She'll return to see [place]  in December as noted above, but knows to call prior that time with any changes or problems.  Montina Dorrance, PA-C   08/20/2013 4:19 PM   ADDENDUM: I met with the patient today and discussed her diagnosis, treatment history and prognosis. In brief:  Sevin is a 47 year old [place] woman who underwent left lower inner quadrant lumpectomy December of 2006 for a pT2 pN1, stage IIB invasive ductal carcinoma, grade 3, estrogen and progesterone receptor positive, with an MIB-1 of 38% and no HER-2 amplification. Adjuvantly she received "TAC" chemotherapy (cyclophosphamide, docetaxel and doxorubicin) x6, completed may of 2007, after which she received adjuvant radiation completed in July of 2007.  Her anti-estrogen therapy consisted of tamoxifen between August of 2007 and October of 2009, with letrozole started October of 2009 (after the patient underwent total abdominal hysterectomy with bilateral salpingo-oophorectomy August of 2009.  The patient was still on letrozole when she was found to have a left breast recurrence December of 2010. She underwent bilateral mastectomies February of 2011 for a left sided pT1b NX invasive ductal carcinoma, grade 2, estrogen receptor 20% positive, but progesterone receptor negative. HER-2 was again not amplified. MIB-1-1 was 13%. The right breast was benign.  She had bilateral implant reconstruction with a left latissimus flap and received adjuvant chemotherapy again, this time with carboplatin and gemcitabine x1, with poor tolerance, followed by cyclophosphamide, methotrexate and fluorouracil for 5 cycles completed in July of 2011.  The patient's antiestrogen therapy was resumed January of 2012 with exemestane, and continued on until November of 2013 when she developed a transient ischemic attack. In total, then, she received a little over 5 years of antiestrogen therapy, including 2 years of tamoxifen and 3 years of aromatase inhibitors. She is very reluctant to resume this  because of the temporal association between exemestane and her transient ischemic attack. I am not uncomfortable with following her off treatment at this time.  I think she would benefit from a hypercoagulable workup and this is being operationalized. I don't think her  headaches are going to be related to cancer, but we will establish that by obtaining a brain MRI soon. She will follow up with her neurologist. She is already scheduled for restaging studies later this year. At that point we will likely stop routine scans and start yearly followup with physical exam and lab work alone.    I personally saw this patient and performed a substantive portion of this encounter with the listed APP documented above.   Lowella Dell, MD

## 2013-08-20 NOTE — Telephone Encounter (Signed)
appts made and printed. Pt is aware that cs will call w/ appt for CT chest. DR. Dohmeier office has already called the pt.pt went for MR of Brain today.Kristin Kitchentd

## 2013-08-21 LAB — HYPERCOAGULABLE PANEL, COMPREHENSIVE
AntiThromb III Func: 91 % (ref 76–126)
Anticardiolipin IgA: 1 APL U/mL (ref <22)
Anticardiolipin IgG: 1 GPL U/mL (ref <23)
Anticardiolipin IgM: 10 MPL U/mL (ref <11)
Beta-2 Glyco I IgG: 0 G Units (ref <20)
Beta-2-Glycoprotein I IgA: 0 A Units (ref <20)
Beta-2-Glycoprotein I IgM: 1 M Units (ref <20)
DRVVT: 32.5 secs (ref <42.9)
Lupus Anticoagulant: NOT DETECTED
PTT Lupus Anticoagulant: 31.6 secs (ref 28.0–43.0)
Protein C Activity: 118 % (ref 75–133)
Protein C, Total: 68 % — ABNORMAL LOW (ref 72–160)
Protein S Activity: 85 % (ref 69–129)
Protein S Total: 95 % (ref 60–150)

## 2013-08-27 ENCOUNTER — Telehealth: Payer: Self-pay | Admitting: *Deleted

## 2013-08-27 NOTE — Telephone Encounter (Signed)
This RN called pt to inform pt of hypercoag panel results post review by AB/PA showing no concerns.  Pt verbalized understanding.

## 2013-11-20 ENCOUNTER — Other Ambulatory Visit: Payer: Self-pay | Admitting: Physician Assistant

## 2013-11-21 ENCOUNTER — Telehealth: Payer: Self-pay | Admitting: Oncology

## 2013-11-21 ENCOUNTER — Other Ambulatory Visit: Payer: Self-pay | Admitting: Physician Assistant

## 2013-11-21 ENCOUNTER — Telehealth: Payer: Self-pay | Admitting: *Deleted

## 2013-11-21 ENCOUNTER — Ambulatory Visit (HOSPITAL_COMMUNITY)
Admission: RE | Admit: 2013-11-21 | Discharge: 2013-11-21 | Disposition: A | Discharge status: Home / Self Care | Payer: BC Managed Care – PPO | Source: Ambulatory Visit | Attending: Physician Assistant | Admitting: Physician Assistant

## 2013-11-21 ENCOUNTER — Other Ambulatory Visit: Payer: Self-pay | Admitting: *Deleted

## 2013-11-21 ENCOUNTER — Encounter (HOSPITAL_COMMUNITY): Payer: Self-pay

## 2013-11-21 DIAGNOSIS — Z853 Personal history of malignant neoplasm of breast: Secondary | ICD-10-CM | POA: Insufficient documentation

## 2013-11-21 DIAGNOSIS — R918 Other nonspecific abnormal finding of lung field: Secondary | ICD-10-CM | POA: Insufficient documentation

## 2013-11-21 DIAGNOSIS — R079 Chest pain, unspecified: Secondary | ICD-10-CM | POA: Insufficient documentation

## 2013-11-21 DIAGNOSIS — Z9221 Personal history of antineoplastic chemotherapy: Secondary | ICD-10-CM | POA: Insufficient documentation

## 2013-11-21 DIAGNOSIS — K7689 Other specified diseases of liver: Secondary | ICD-10-CM | POA: Insufficient documentation

## 2013-11-21 DIAGNOSIS — Z923 Personal history of irradiation: Secondary | ICD-10-CM | POA: Insufficient documentation

## 2013-11-21 MED ORDER — IOHEXOL 300 MG/ML  SOLN
80.0000 mL | Freq: Once | INTRAMUSCULAR | Status: AC | PRN
  Administered 2013-11-21: 80 mL via INTRAVENOUS

## 2013-11-21 NOTE — Telephone Encounter (Signed)
THIS REPORT WAS CALLED AND FAXED TO [facility]. THE REPORT WAS GIVEN TO AMY BERRY,PA.

## 2013-11-22 ENCOUNTER — Ambulatory Visit (HOSPITAL_COMMUNITY)
Admission: RE | Admit: 2013-11-22 | Discharge: 2013-11-22 | Disposition: A | Discharge status: Home / Self Care | Payer: BC Managed Care – PPO | Source: Ambulatory Visit | Attending: Oncology | Admitting: Oncology

## 2013-11-22 ENCOUNTER — Ambulatory Visit (HOSPITAL_BASED_OUTPATIENT_CLINIC_OR_DEPARTMENT_OTHER): Payer: BC Managed Care – PPO | Admitting: Physician Assistant

## 2013-11-22 ENCOUNTER — Other Ambulatory Visit (HOSPITAL_BASED_OUTPATIENT_CLINIC_OR_DEPARTMENT_OTHER): Payer: BC Managed Care – PPO

## 2013-11-22 ENCOUNTER — Telehealth: Payer: Self-pay | Admitting: *Deleted

## 2013-11-22 ENCOUNTER — Encounter (INDEPENDENT_AMBULATORY_CARE_PROVIDER_SITE_OTHER): Payer: Self-pay

## 2013-11-22 ENCOUNTER — Encounter: Payer: Self-pay | Admitting: Physician Assistant

## 2013-11-22 VITALS — BP 124/83 | HR 73 | Temp 99.0°F | Resp 18 | Ht 64.0 in | Wt 190.5 lb

## 2013-11-22 DIAGNOSIS — G8929 Other chronic pain: Secondary | ICD-10-CM | POA: Insufficient documentation

## 2013-11-22 DIAGNOSIS — Z8673 Personal history of transient ischemic attack (TIA), and cerebral infarction without residual deficits: Secondary | ICD-10-CM | POA: Insufficient documentation

## 2013-11-22 DIAGNOSIS — Z170 Estrogen receptor positive status [ER+]: Secondary | ICD-10-CM

## 2013-11-22 DIAGNOSIS — C50319 Malignant neoplasm of lower-inner quadrant of unspecified female breast: Secondary | ICD-10-CM

## 2013-11-22 DIAGNOSIS — F419 Anxiety disorder, unspecified: Secondary | ICD-10-CM

## 2013-11-22 DIAGNOSIS — Z853 Personal history of malignant neoplasm of breast: Secondary | ICD-10-CM

## 2013-11-22 DIAGNOSIS — C787 Secondary malignant neoplasm of liver and intrahepatic bile duct: Secondary | ICD-10-CM | POA: Insufficient documentation

## 2013-11-22 DIAGNOSIS — R590 Localized enlarged lymph nodes: Secondary | ICD-10-CM

## 2013-11-22 DIAGNOSIS — N281 Cyst of kidney, acquired: Secondary | ICD-10-CM | POA: Insufficient documentation

## 2013-11-22 DIAGNOSIS — R599 Enlarged lymph nodes, unspecified: Secondary | ICD-10-CM | POA: Insufficient documentation

## 2013-11-22 DIAGNOSIS — K769 Liver disease, unspecified: Secondary | ICD-10-CM

## 2013-11-22 DIAGNOSIS — C50919 Malignant neoplasm of unspecified site of unspecified female breast: Secondary | ICD-10-CM | POA: Insufficient documentation

## 2013-11-22 HISTORY — DX: Personal history of transient ischemic attack (TIA), and cerebral infarction without residual deficits: Z86.73

## 2013-11-22 HISTORY — DX: Anxiety disorder, unspecified: F41.9

## 2013-11-22 HISTORY — DX: Other chronic pain: G89.29

## 2013-11-22 LAB — CBC WITH DIFFERENTIAL/PLATELET
Basophils Absolute: 0 10*3/uL (ref 0.0–0.1)
EOS%: 1.3 % (ref 0.0–7.0)
Eosinophils Absolute: 0.1 10*3/uL (ref 0.0–0.5)
LYMPH%: 21.4 % (ref 14.0–49.7)
MCH: 30.8 pg (ref 25.1–34.0)
MCV: 91.9 fL (ref 79.5–101.0)
MONO%: 5.1 % (ref 0.0–14.0)
NEUT#: 3.2 10*3/uL (ref 1.5–6.5)
Platelets: 176 10*3/uL (ref 145–400)
RBC: 4.57 10*6/uL (ref 3.70–5.45)

## 2013-11-22 LAB — COMPREHENSIVE METABOLIC PANEL (CC13)
Alkaline Phosphatase: 68 U/L (ref 40–150)
BUN: 14.1 mg/dL (ref 7.0–26.0)
Glucose: 112 mg/dl (ref 70–140)
Sodium: 140 mEq/L (ref 136–145)
Total Bilirubin: 0.63 mg/dL (ref 0.20–1.20)

## 2013-11-22 MED ORDER — GADOBENATE DIMEGLUMINE 529 MG/ML IV SOLN
18.0000 mL | Freq: Once | INTRAVENOUS | Status: AC | PRN
  Administered 2013-11-22: 18 mL via INTRAVENOUS

## 2013-11-22 NOTE — Telephone Encounter (Signed)
appts made and printed...td 

## 2013-11-22 NOTE — Progress Notes (Signed)
ID: Kristin Hoffman OB: 01/24/1966  MR#: [id number]  NGE#:[id number]  PCP: Albertina Senegal, MD GYN:  Harold Hedge SU: Cyndia Bent OTHER MD: Margaretmary Dys  CHIEF COMPLAINT:  Right  Breast Cancer x 2 occurrences    HISTORY OF PRESENT ILLNESS: This patient was previously followed by Dr. Pierce Crane, and is transferred to Dr. Darrall Dears service as of 08/20/2013.  At the age of 14, the patient had a screening mammogram as a baseline. She had recently given birth and was on birth control pills at that time. The mammogram in November 2006 showed an area of architectural distortion in the left lower inner quadrant. Subsequently an ultrasound was obtained of the left breast showing a 2.0 x 1.5 x 1.4 cm mass, at the 8:00 position, 4 cm from the nipple. A core biopsy performed on 10/21/2005 showed an invasive mammary carcinoma, ER +70%, PR +41%, HER-2/neu negative, with proliferation marker of 38%. [phone])  Breast MRI on 11/08/2005 confirmed a 2.5 cm spiculated mass in the left lower inner quadrant with 3 small satellite nodules adjacent to the primary mass: 3.5 cm posterior medial to the primary mass, measuring 9 mm; 1.5 cm anterior medial to the primary mass measuring 5 mm; and 2 cm medial to the primary mass measuring 5 mm. No axillary adenopathy was noted. No suspicious masses or enhancement are noted in the right breast.  Adamary underwentt left lumpectomy under the care of Dr. 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 2 of 23 lymph nodes were involved.  Margins were clear.  She received adjuvant chemotherapy consisting of 6 q. three-week doses of docetaxel/doxorubicin/cyclophosphamide given between January 2007 and may of 2007, with last dose on 04/20/2006. She underwent radiation therapy completed 07/11/2006, after which she began on tamoxifen in early August 2007.  She underwent hysterectomy with  bilateral salpingo-oophorectomy on 07/15/2008, and was started on letrozole in October of 2009.  A mammogram 11/10/2009 showed microcalcifications in the left breast, and a subsequent biopsy on 11/18/2009 confirmed invasive mammary carcinoma in the left breast. PET and CT of the chest showed no evidence of metastasis in January 2011.  She underwent bilateral mastectomies 01/25/2010, with the right breast showing no evidence of malignancy; in the left breast showing a 1.0 cm grade 2 invasive ductal carcinoma with high-grade DCIS. Tumor was ER +22%, PR negative, HER-2/neu negative, with proliferation marker of 13%. Margins were clear. Patient underwent concurrent left latissimus flap reconstruction and right implant reconstruction.  She received additional chemotherapy with one cycle of carboplatin/gemcitabine given on 03/11/2010. She then received 5 q. three-week cycles of IV CMF between 03/25/2010 and 06/17/2010.  She was started on exemestane in January 2012 and continued until late November 2013 when she was hospitalized for apparent TIA.   Her subsequent history is as detailed below   INTERVAL HISTORY: Zonnie is seen in the office today for followup of her left breast carcinoma.  She has been off anti-estrogen therapy since experiencing a TIA in November 2013. She was told by a physician in the hospital (not to an oncologist) that the exemestane was likely the cause of the TIA.  Understandably, this made her extremely nervous about resuming the medication, and we were hoping we could follow her with observation alone.   She was scheduled to come in next week, but we moved the appointment up sooner when she called earlier this week complaining of pain in her  right side. She tells me that originally, the right side was uncomfortable when she was lying down, but now the pain has become more constant and she describes it as a dull ache. The pain is located along the lateral portion of the right breast near  the axillary incision, and radiates down along the lateral portion of the breast to the inframammary fold. She does note that she has been wearing underwire bras, and that the wire also covers the same area. She denies any known injuries.  Subsequently, a chest CT was obtained yesterday which showed some suspicious lesions, including a new right hepatic lobe lesion, pre-tracheal lymphadenopathy, and a new left lower lobe pulmonary nodule.  This was followed by an abdominal MRI today which confirmed a 1.9 x 1.5 cm right hepatic lobe lesion, with 2 smaller right lobe lesions also noted. There were also enlarged retroperitoneal lymph nodes.  These results are all detailed further below.  REVIEW OF SYSTEMS: Duane has had no recent illnesses and denies any fevers, chills, night sweats, or significant hot flashes. She bruises easily but denies any additional skin changes, rashes, or abnormal bleeding. Her appetite is good and she denies any nausea or change in bowel or bladder habits.  She denies any abdominal or pelvic pain. She has a runny nose which she associates with seasonal allergies. She's had no cough, shortness of breath, or chest pain. She has occasional anxiety. She denies any additional myalgias, arthralgias, or bony pain.  She still has occasional headaches, but feels that these have improved slightly. She's had no dizziness or change in vision. She also denies any peripheral swelling, peripheral neuropathy, or peripheral weakness.  A detailed review of systems is otherwise stable and noncontributory.   PAST MEDICAL HISTORY: Past Medical History  Diagnosis Date  . Breast cancer   . Depression   . Reflux   . Cancer   . Hx-TIA (transient ischemic attack) 11/22/2013  . Chronic headaches 11/22/2013  . Anxiety 11/22/2013    PAST SURGICAL HISTORY: Past Surgical History  Procedure Laterality Date  . Mastectomy w/ nodes partial    . Mastectomy    . Breast reconstruction    . Portacath  placement      x 2  . Portacath removal      x 2  . Abdominal hysterectomy    . Tee without cardioversion  11/12/2012    Procedure: TRANSESOPHAGEAL ECHOCARDIOGRAM (TEE);  Surgeon: Donato Schultz, MD;  Location: [facility] ENDOSCOPY;  Service: Cardiovascular;  Laterality: N/A;  This TEE may be Dr. Anne Fu or a LHC Dr    FAMILY HISTORY Both parents are alive and well. Patient has one sister who is 10 years younger and is in good health. No other history of breast or ovarian cancer in the family. History reviewed. No pertinent family history.  GYNECOLOGIC HISTORY:  G2P2, menarche at age 46 with irregular menses. On birth control pills in the past. On Clomid to induce ovulation with first pregnancy. Also had preeclampsia with first pregnancy. Status post hysterectomy and bilateral salpingo-oophorectomy in August 2009.  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 She is originally from [place]. She's been married to her husband, Tammy Sours, for 13 years. He works as an Multimedia programmer at [employer].   ADVANCED DIRECTIVES:    HEALTH MAINTENANCE:  (Updated 11/22/2013) History  Substance Use Topics  . Smoking status: Never Smoker   .  Smokeless tobacco: Never Used  . Alcohol Use: Yes     Comment: rarely     Colonoscopy:  Never  PAP: S/P LAVH/BSO in August 2009  Bone density: Never  Lipid panel: Not on file   No Known Allergies  Current Outpatient Prescriptions  Medication Sig Dispense Refill  . aspirin 81 MG tablet Take 81 mg by mouth daily.      . cetirizine (ZYRTEC) 10 MG tablet Take 10 mg by mouth daily.        . cholecalciferol (VITAMIN D) 400 UNITS TABS tablet Take 1,000 Units by mouth daily.      Marland Kitchen escitalopram (LEXAPRO) 20 MG tablet Take 20 mg by mouth daily.        Marland Kitchen LORazepam (ATIVAN) 0.5 MG tablet Take 2 tablets (1 mg total) by mouth every 8 (eight) hours.  60 tablet  3  . Multiple Vitamin  (MULTIVITAMIN) tablet Take 1 tablet by mouth daily.      Marland Kitchen omeprazole (PRILOSEC) 40 MG capsule       . zolpidem (AMBIEN) 10 MG tablet        No current facility-administered medications for this visit.    OBJECTIVE: Young Caucasian female who appears comfortable and is in no acute distress Filed Vitals:   11/22/13 1048  BP: 124/83  Pulse: 73  Temp: 99 F (37.2 C)  Resp: 18     Body mass index is 32.68 kg/(m^2).    ECOG FS: 0 Filed Weights   11/22/13 1048  Weight: 190 lb 8 oz (86.41 kg)   Physical Exam: HEENT:  Sclerae anicteric. Neck is supple. Oropharynx clear and moist.  NODES:  No cervical or supraclavicular lymphadenopathy palpated.  BREAST EXAM:  Patient is status post bilateral mastectomies. On the right, status post implant reconstruction.  On the left, status post latissimus flap reconstruction, with no nodularity, skin changes, or evidence of local recurrence. Axillae are benign bilaterally with no palpable lymphadenopathy. There is some tenderness to palpation extending from the left axillary incision, around the lateral portion of the left breast, towards the inframammary fold. There no suspicious nodules or skin changes in this area. No swelling noted. LUNGS:  Clear to auscultation bilaterally with good excursion.  No wheezes or rhonchi. HEART:  Regular rate and rhythm. No murmur  ABDOMEN:  Soft, nontender. No hepatomegaly palpated. Positive bowel sounds.  MSK:  No focal spinal tenderness to palpation. Good range of motion bilaterally in the upper extremities. No joint swelling. EXTREMITIES:  No peripheral edema.   SKIN:  Benign with no evidence of skin changes or rashes. No petechiae or visible ecchymoses. NEURO:  Nonfocal. Well oriented.  Appropriate affect.   LAB RESULTS:  Lab Results  Component Value Date   WBC 4.5 11/22/2013   NEUTROABS 3.2 11/22/2013   HGB 14.1 11/22/2013   HCT 42.0 11/22/2013   MCV 91.9 11/22/2013   PLT 176 11/22/2013      Chemistry       Component Value Date/Time   NA 140 11/22/2013 0951   NA 142 11/12/2012 0735   K 4.2 11/22/2013 0951   K 3.8 11/12/2012 0735   CL 104 11/14/2012 1407   CL 104 11/12/2012 0735   CO2 26 11/22/2013 0951   CO2 30 11/12/2012 0735   BUN 14.1 11/22/2013 0951   BUN 13 11/12/2012 0735   CREATININE 0.9 11/22/2013 0951   CREATININE 0.97 11/12/2012 0735      Component Value Date/Time   CALCIUM 9.6 11/22/2013 0951  CALCIUM 9.1 11/12/2012 0735   ALKPHOS 68 11/22/2013 0951   ALKPHOS 69 11/12/2012 0735   AST 28 11/22/2013 0951   AST 16 11/12/2012 0735   ALT 18 11/22/2013 0951   ALT 13 11/12/2012 0735   BILITOT 0.63 11/22/2013 0951   BILITOT 0.9 11/12/2012 0735      STUDIES: Ct Chest W Contrast  11/21/2013   CLINICAL DATA:  Chest pain, history of breast cancer. Completed chemotherapy and radiation. Diagnosed 2006 and 2010.  EXAM: CT CHEST WITH CONTRAST  TECHNIQUE: Multidetector CT imaging of the chest was performed during intravenous contrast administration.  CONTRAST:  80mL OMNIPAQUE IOHEXOL 300 MG/ML  SOLN  COMPARISON:  PET-CT 11/23/2012, most recent similar comparison chest CT 07/15/2010.  FINDINGS: The previously seen 4 mm right upper lobe pulmonary nodule now measures 2 mm and is therefore likely benign given its decrease in size since the prior exam more than 3 years ago. Compared to dissimilar prior exam, there has been increase in size of a presumed intrapulmonary lymph node measuring 3 mm in short axis image 18, in the right upper lobe. A possible intrapulmonary lymph node measuring 3 mm in short axis diameter versus a new pulmonary nodule is noted also in the right upper lobe image 17.  A new left lower lobe nodule measuring 0.9 cm image 44 is identified. Central airways are patent.  No new lytic or sclerotic osseous lesion is identified.  Bilateral submuscular breast implants are in place. No axillary or internal mammary artery chain lymphadenopathy. Heart size is normal. A new 1 cm pretracheal lymph  node is identified image 22.  At incomplete imaging of the upper abdomen, there an ill-defined hypodense 1.8 cm at the dome of the right hepatic lobe image 42.  IMPRESSION: New ill-defined right hepatic lobe lesion, not well characterized on this exam but concerning for development of possible metastasis in the setting of pretracheal lymphadenopathy, a new left lower lobe pulmonary nodule and enlarging right upper lobe pulmonary nodules or intramammary lymph nodes.  Dedicated CT abdomen/pelvis recommended for further evaluation. If findings do not demonstrate other conclusive evidence for intra-abdominal or pelvic metastatic disease, and the hepatic mass is not completely characterized at CT, then abdominal MRI may also be necessary for liver mass characterization.  Ultimately, PET-CT is recommended for assessment of these findings.  These results will be called to the ordering clinician or representative by the Radiologist Assistant, and communication documented in the PACS Dashboard.   Electronically Signed   By: Christiana Pellant M.D.   On: 11/21/2013 15:08   Mr Liver W Wo Contrast  11/22/2013   CLINICAL DATA:  Breast cancer.  Possible liver metastasis.  EXAM: MRI ABDOMEN WITHOUT AND WITH CONTRAST  TECHNIQUE: Multiplanar, multisequence MR imaging was performed both before and after administration of intravenous contrast.  CONTRAST:  18mL MULTIHANCE GADOBENATE DIMEGLUMINE 529 MG/ML IV SOLN  COMPARISON:  Chest CT 11/21/2013.  FINDINGS: There is an ill-defined right hepatic lobe region of the dome which measures approximately 19 x 15 mm. This demonstrates increased T2 signal intensity and contrast enhancement and is worrisome for a metastasis. Other peripheral wedge-shaped areas of enhancement are probably small vascular shunts. On the T2 weighted sequences there are 2 other small right hepatic lobe lesions posteriorly. These are best seen on series 3 image 24 and 25. These are very difficult to see on the post  contrast images.  The spleen is normal in size. No focal lesions. The pancreas is normal. The gallbladder is  normal. No common bile duct dilatation. The adrenal glands and kidneys are unremarkable. A small left renal cyst is noted.  The stomach, duodenum, visualized small bowel and visualized colon are grossly normal. There are enlarged retroperitoneal lymph nodes best demonstrated on the postcontrast images series at 1103 image 77 and image number 84.  IMPRESSION: 1. 19 x 15 mm right hepatic lobe metastatic lesion and 2 smaller right lobe lesions. 2. Retroperitoneal lymphadenopathy.   Electronically Signed   By: Loralie Champagne M.D.   On: 11/22/2013 14:28      ASSESSMENT: 47 y.o. [place] woman  (1)  status post left lumpectomy under the care of Dr. 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 2 of 23 lymph nodes were involved.  Margins were clear.  (2) status post adjuvant chemotherapy consisting of 6 q. three-week doses of docetaxel/doxorubicin/cyclophosphamide given between January 2007 and may of 2007, with last dose on 04/20/2006.  (3) status post radiation therapy to the left breast, completed 07/11/2006  (4) began on tamoxifen in early August 2007 and continued until October 2009. She status post hysterectomy with bilateral salpingo-oophorectomy on 07/15/2008, and was started on letrozole in October of 2009.  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 PET and CT of the chest showed no evidence of metastasis in January 2011.  (6) status post bilateral mastectomies 01/25/2010, with the right breast showing no evidence of malignancy; in the left breast showing a 1.0 cm grade 2 invasive ductal carcinoma with high-grade DCIS. Tumor was ER +22%, PR negative, HER-2/neu negative,  with proliferation marker of 13%. Margins were clear. Patient underwent concurrent left latissimus flap reconstruction and right implant reconstruction.  (7)  status post additional chemotherapy with one cycle of carboplatin/gemcitabine given on 03/11/2010. She then received 5 q. three-week cycles of IV CMF between 03/25/2010 and 06/17/2010.  (8) started on exemestane in January 2012 and continued until late November 2013 when she was hospitalized for apparent TIA. There was apparently some question of whether or not the antiestrogen therapy could have contributed to this development, and she has not resumed antiestrogen therapy since November 2013.  (9)  evidence of likely disease progression noted on chest CT and liver MRI in December 2014, with suspicious lung nodules, lymphadenopathy, and 3 lesions in the right lobe of the liver.   PLAN: Dr. Darnelle Catalan and I reviewed Tyrihanna's scans together.  She is also scheduled for a PET scan on December 22, although we are hoping to move that up sooner.   I reviewed all of the above scan results with Brynlee, and she is appropriately concerned. Dr. Darrall Dears suggestion is that she resume the exemestane, as it is extremely unlikely that it contributed at all to her TIA in November 2013. She is still a little hesitant to do so, even after our discussion today, although she tells me that she is ultimately "willing to do whatever is necessary".  Of course we would want to avoid tamoxifen. She was on letrozole when she was found to have the left breast recurrence in 2010. Of course she was then on the exemestane when she experienced a TIA in November 2013.  Evelean would like to get the final results from the PET scan before she makes a final decision regarding the exemestane. She is scheduled to see Dr. Darnelle Catalan  following the PET scan, so they can discuss this further and develop an appropriate treatment plan at that visit.    With regards to the pain in the left  side, we see no obvious cause for this pain on the recent CT. I wonder if this could be postsurgical in origin, with the area being irritated by the underwire bra. I suggested that she try wearing a soft exercise bra without underwire for the next few weeks, and possibly try taking some low-dose Aleve, one tablet twice daily with food, for the next few days to see if this helps the pain.  I will also mention that she is in process of scheduling an appointment with Dr. Porfirio Mylar Dohmeier for further evaluation of her headaches and perhaps that visit will also give her some further insight into the history of her TIA.Marland Kitchen  All this was reviewed in detail with Dawsyn today. She does voice her understanding and agreement with the plan, and knows to call prior to her next scheduled appointment.   Primo Innis, PA-C   11/22/2013 3:15 PM

## 2013-11-25 ENCOUNTER — Ambulatory Visit (HOSPITAL_COMMUNITY): Payer: BC Managed Care – PPO

## 2013-11-25 ENCOUNTER — Other Ambulatory Visit: Payer: Self-pay | Admitting: Oncology

## 2013-11-26 ENCOUNTER — Other Ambulatory Visit (HOSPITAL_COMMUNITY): Payer: BC Managed Care – PPO

## 2013-11-27 ENCOUNTER — Other Ambulatory Visit: Payer: BC Managed Care – PPO

## 2013-11-27 ENCOUNTER — Ambulatory Visit: Payer: BC Managed Care – PPO | Admitting: Physician Assistant

## 2013-12-02 ENCOUNTER — Encounter (HOSPITAL_COMMUNITY)
Admission: RE | Admit: 2013-12-02 | Discharge: 2013-12-02 | Disposition: A | Discharge status: Home / Self Care | Payer: BC Managed Care – PPO | Source: Ambulatory Visit | Attending: Oncology | Admitting: Oncology

## 2013-12-02 DIAGNOSIS — Z853 Personal history of malignant neoplasm of breast: Secondary | ICD-10-CM

## 2013-12-02 DIAGNOSIS — C787 Secondary malignant neoplasm of liver and intrahepatic bile duct: Secondary | ICD-10-CM | POA: Insufficient documentation

## 2013-12-02 DIAGNOSIS — R918 Other nonspecific abnormal finding of lung field: Secondary | ICD-10-CM | POA: Insufficient documentation

## 2013-12-02 DIAGNOSIS — R599 Enlarged lymph nodes, unspecified: Secondary | ICD-10-CM | POA: Insufficient documentation

## 2013-12-02 DIAGNOSIS — C50919 Malignant neoplasm of unspecified site of unspecified female breast: Secondary | ICD-10-CM | POA: Insufficient documentation

## 2013-12-02 DIAGNOSIS — C772 Secondary and unspecified malignant neoplasm of intra-abdominal lymph nodes: Secondary | ICD-10-CM | POA: Insufficient documentation

## 2013-12-02 LAB — GLUCOSE, CAPILLARY: Glucose-Capillary: 70 mg/dL (ref 70–99)

## 2013-12-02 MED ORDER — FLUDEOXYGLUCOSE F - 18 (FDG) INJECTION: 20.1000 | Freq: Once | INTRAVENOUS | Status: AC | PRN

## 2013-12-03 ENCOUNTER — Telehealth: Payer: Self-pay | Admitting: Oncology

## 2013-12-03 ENCOUNTER — Other Ambulatory Visit: Payer: Self-pay | Admitting: Physician Assistant

## 2013-12-03 ENCOUNTER — Ambulatory Visit (HOSPITAL_BASED_OUTPATIENT_CLINIC_OR_DEPARTMENT_OTHER): Payer: BC Managed Care – PPO | Admitting: Oncology

## 2013-12-03 ENCOUNTER — Ambulatory Visit (HOSPITAL_BASED_OUTPATIENT_CLINIC_OR_DEPARTMENT_OTHER): Payer: BC Managed Care – PPO

## 2013-12-03 ENCOUNTER — Other Ambulatory Visit: Payer: Self-pay | Admitting: *Deleted

## 2013-12-03 VITALS — BP 153/98 | HR 88 | Temp 98.3°F | Resp 18 | Ht 64.0 in | Wt 191.2 lb

## 2013-12-03 DIAGNOSIS — Z5111 Encounter for antineoplastic chemotherapy: Secondary | ICD-10-CM

## 2013-12-03 DIAGNOSIS — K7689 Other specified diseases of liver: Secondary | ICD-10-CM

## 2013-12-03 DIAGNOSIS — R911 Solitary pulmonary nodule: Secondary | ICD-10-CM

## 2013-12-03 DIAGNOSIS — C50312 Malignant neoplasm of lower-inner quadrant of left female breast: Secondary | ICD-10-CM | POA: Insufficient documentation

## 2013-12-03 DIAGNOSIS — C50319 Malignant neoplasm of lower-inner quadrant of unspecified female breast: Secondary | ICD-10-CM

## 2013-12-03 DIAGNOSIS — R599 Enlarged lymph nodes, unspecified: Secondary | ICD-10-CM

## 2013-12-03 DIAGNOSIS — R300 Dysuria: Secondary | ICD-10-CM

## 2013-12-03 DIAGNOSIS — Z8673 Personal history of transient ischemic attack (TIA), and cerebral infarction without residual deficits: Secondary | ICD-10-CM

## 2013-12-03 LAB — URINALYSIS, MICROSCOPIC - CHCC
Bilirubin (Urine): NEGATIVE
Glucose: NEGATIVE mg/dL
Ketones: NEGATIVE mg/dL
Specific Gravity, Urine: 1.005 (ref 1.003–1.035)

## 2013-12-03 MED ORDER — FULVESTRANT 250 MG/5ML IM SOLN
500.0000 mg | INTRAMUSCULAR | Status: DC
  Administered 2013-12-03: 500 mg via INTRAMUSCULAR
  Filled 2013-12-03: qty 10

## 2013-12-03 NOTE — Progress Notes (Signed)
ID: Kristin Hoffman OB: 05-14-1966  MR#: [id number]  WUJ#:[id number]  PCP: Albertina Senegal, MD GYN:  Harold Hedge SU: Cyndia Bent OTHER MD: Margaretmary Dys, Porfirio Mylar Dohmeier  CHIEF COMPLAINT:  Right  Breast Cancer x 2 occurrences    HISTORY OF PRESENT ILLNESS: This patient was previously followed by Dr. Pierce Crane, and is transferred to Dr. Darrall Dears service as of 08/20/2013.  At the age of 47, the patient had a screening mammogram as a baseline. She had recently given birth and was on birth control pills at that time. The mammogram in November 2006 showed an area of architectural distortion in the left lower inner quadrant. Subsequently an ultrasound was obtained of the left breast showing a 2.0 x 1.5 x 1.4 cm mass, at the 8:00 position, 4 cm from the nipple. A core biopsy performed on 10/21/2005 showed an invasive mammary carcinoma, ER +70%, PR +41%, HER-2/neu negative, with proliferation marker of 38%. [phone])  Breast MRI on 11/08/2005 confirmed a 2.5 cm spiculated mass in the left lower inner quadrant with 3 small satellite nodules adjacent to the primary mass: 3.5 cm posterior medial to the primary mass, measuring 9 mm; 1.5 cm anterior medial to the primary mass measuring 5 mm; and 2 cm medial to the primary mass measuring 5 mm. No axillary adenopathy was noted. No suspicious masses or enhancement are noted in the right breast.  Mashawn underwentt left lumpectomy under the care of Dr. 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 2 of 23 lymph nodes were involved.  Margins were clear.  She received adjuvant chemotherapy consisting of 6 q. three-week doses of docetaxel/doxorubicin/cyclophosphamide given between January 2007 and may of 2007, with last dose on 04/20/2006. She underwent radiation therapy completed 07/11/2006, after which she began on tamoxifen in early August 2007.  She underwent  hysterectomy with bilateral salpingo-oophorectomy on 07/15/2008, and was started on letrozole in October of 2009.  A mammogram 11/10/2009 showed microcalcifications in the left breast, and a subsequent biopsy on 11/18/2009 confirmed invasive mammary carcinoma in the left breast. PET and CT of the chest showed no evidence of metastasis in January 2011.  She underwent bilateral mastectomies 01/25/2010, with the right breast showing no evidence of malignancy; in the left breast showing a 1.0 cm grade 2 invasive ductal carcinoma with high-grade DCIS. Tumor was ER +22%, PR negative, HER-2/neu negative, with proliferation marker of 13%. Margins were clear. Patient underwent concurrent left latissimus flap reconstruction and right implant reconstruction.  She received additional chemotherapy with one cycle of carboplatin/gemcitabine given on 03/11/2010. She then received 5 q. three-week cycles of IV CMF between 03/25/2010 and 06/17/2010.  She was started on exemestane in January 2012 and continued until late November 2013 when she was hospitalized for apparent TIA.   Her subsequent history is as detailed below   INTERVAL HISTORY: Kristin Hoffman is seen in the office today for followup of her left breast carcinoma.  She has been off anti-estrogen therapy since experiencing a TIA in November 2013. She was told by a physician in the hospital (not to an oncologist) that the exemestane was likely the cause of the TIA.  Understandably, this made her extremely nervous about resuming the medication, and we were hoping we could follow her with observation alone.   She was scheduled to come in next week, but we moved the appointment up sooner when she called earlier this week complaining of pain  in her right side. She tells me that originally, the right side was uncomfortable when she was lying down, but now the pain has become more constant and she describes it as a dull ache. The pain is located along the lateral portion of the  right breast near the axillary incision, and radiates down along the lateral portion of the breast to the inframammary fold. She does note that she has been wearing underwire bras, and that the wire also covers the same area. She denies any known injuries.  Subsequently, a chest CT was obtained yesterday which showed some suspicious lesions, including a new right hepatic lobe lesion, pre-tracheal lymphadenopathy, and a new left lower lobe pulmonary nodule.  This was followed by an abdominal MRI today which confirmed a 1.9 x 1.5 cm right hepatic lobe lesion, with 2 smaller right lobe lesions also noted. There were also enlarged retroperitoneal lymph nodes.  These results are all detailed further below.  REVIEW OF SYSTEMS: Jaylei has had no recent illnesses and denies any fevers, chills, night sweats, or significant hot flashes. She bruises easily but denies any additional skin changes, rashes, or abnormal bleeding. Her appetite is good and she denies any nausea or change in bowel or bladder habits.  She denies any abdominal or pelvic pain. She has a runny nose which she associates with seasonal allergies. She's had no cough, shortness of breath, or chest pain. She has occasional anxiety. She denies any additional myalgias, arthralgias, or bony pain.  She still has occasional headaches, but feels that these have improved slightly. She's had no dizziness or change in vision. She also denies any peripheral swelling, peripheral neuropathy, or peripheral weakness.  A detailed review of systems is otherwise stable and noncontributory.   PAST MEDICAL HISTORY: Past Medical History  Diagnosis Date  . Breast cancer   . Depression   . Reflux   . Cancer   . Hx-TIA (transient ischemic attack) 11/22/2013  . Chronic headaches 11/22/2013  . Anxiety 11/22/2013    PAST SURGICAL HISTORY: Past Surgical History  Procedure Laterality Date  . Mastectomy w/ nodes partial    . Mastectomy    . Breast reconstruction     . Portacath placement      x 2  . Portacath removal      x 2  . Abdominal hysterectomy    . Tee without cardioversion  11/12/2012    Procedure: TRANSESOPHAGEAL ECHOCARDIOGRAM (TEE);  Surgeon: Donato Schultz, MD;  Location: [facility] ENDOSCOPY;  Service: Cardiovascular;  Laterality: N/A;  This TEE may be Dr. Anne Fu or a LHC Dr    FAMILY HISTORY Both parents are alive and well. Patient has one sister who is 10 years younger and is in good health. No other history of breast or ovarian cancer in the family. No family history on file.  GYNECOLOGIC HISTORY:  G2P2, menarche at age 75 with irregular menses. On birth control pills in the past. On Clomid to induce ovulation with first pregnancy. Also had preeclampsia with first pregnancy. Status post hysterectomy and bilateral salpingo-oophorectomy in August 2009.  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 She is originally from [place]. She's been married to [place], for 13 years. He works as an Multimedia programmer at [employer].   ADVANCED DIRECTIVES:    HEALTH MAINTENANCE:  (Updated 11/22/2013) History  Substance Use Topics  . Smoking status: Never Smoker   .  Smokeless tobacco: Never Used  . Alcohol Use: Yes     Comment: rarely     Colonoscopy:  Never  PAP: S/P LAVH/BSO in August 2009  Bone density: Never  Lipid panel: Not on file   No Known Allergies  Current Outpatient Prescriptions  Medication Sig Dispense Refill  . aspirin 81 MG tablet Take 81 mg by mouth daily.      . cetirizine (ZYRTEC) 10 MG tablet Take 10 mg by mouth daily.        . cholecalciferol (VITAMIN D) 400 UNITS TABS tablet Take 1,000 Units by mouth daily.      Marland Kitchen escitalopram (LEXAPRO) 20 MG tablet Take 20 mg by mouth daily.        Marland Kitchen LORazepam (ATIVAN) 0.5 MG tablet Take 2 tablets (1 mg total) by mouth every 8 (eight) hours.  60 tablet  3  . Multiple Vitamin (MULTIVITAMIN) tablet  Take 1 tablet by mouth daily.      Marland Kitchen omeprazole (PRILOSEC) 40 MG capsule       . zolpidem (AMBIEN) 10 MG tablet        No current facility-administered medications for this visit.    OBJECTIVE: Young Caucasian female who appears comfortable and is in no acute distress Filed Vitals:   12/03/13 1400  BP: 153/98  Pulse: 88  Temp: 98.3 F (36.8 C)  Resp: 18     Body mass index is 32.8 kg/(m^2).    ECOG FS: 0 Filed Weights   12/03/13 1400  Weight: 191 lb 3.2 oz (86.728 kg)   Physical Exam: HEENT:  Sclerae anicteric. Neck is supple. Oropharynx clear and moist.  NODES:  No cervical or supraclavicular lymphadenopathy palpated.  BREAST EXAM:  Patient is status post bilateral mastectomies. On the right, status post implant reconstruction.  On the left, status post latissimus flap reconstruction, with no nodularity, skin changes, or evidence of local recurrence. Axillae are benign bilaterally with no palpable lymphadenopathy. There is some tenderness to palpation extending from the left axillary incision, around the lateral portion of the left breast, towards the inframammary fold. There no suspicious nodules or skin changes in this area. No swelling noted. LUNGS:  Clear to auscultation bilaterally with good excursion.  No wheezes or rhonchi. HEART:  Regular rate and rhythm. No murmur  ABDOMEN:  Soft, nontender. No hepatomegaly palpated. Positive bowel sounds.  MSK:  No focal spinal tenderness to palpation. Good range of motion bilaterally in the upper extremities. No joint swelling. EXTREMITIES:  No peripheral edema.   SKIN:  Benign with no evidence of skin changes or rashes. No petechiae or visible ecchymoses. NEURO:  Nonfocal. Well oriented.  Appropriate affect.   LAB RESULTS:  Lab Results  Component Value Date   WBC 4.5 11/22/2013   NEUTROABS 3.2 11/22/2013   HGB 14.1 11/22/2013   HCT 42.0 11/22/2013   MCV 91.9 11/22/2013   PLT 176 11/22/2013      Chemistry      Component  Value Date/Time   NA 140 11/22/2013 0951   NA 142 11/12/2012 0735   K 4.2 11/22/2013 0951   K 3.8 11/12/2012 0735   CL 104 11/14/2012 1407   CL 104 11/12/2012 0735   CO2 26 11/22/2013 0951   CO2 30 11/12/2012 0735   BUN 14.1 11/22/2013 0951   BUN 13 11/12/2012 0735   CREATININE 0.9 11/22/2013 0951   CREATININE 0.97 11/12/2012 0735      Component Value Date/Time   CALCIUM 9.6 11/22/2013 0951  CALCIUM 9.1 11/12/2012 0735   ALKPHOS 68 11/22/2013 0951   ALKPHOS 69 11/12/2012 0735   AST 28 11/22/2013 0951   AST 16 11/12/2012 0735   ALT 18 11/22/2013 0951   ALT 13 11/12/2012 0735   BILITOT 0.63 11/22/2013 0951   BILITOT 0.9 11/12/2012 0735      STUDIES: Ct Chest W Contrast  11/21/2013   CLINICAL DATA:  Chest pain, history of breast cancer. Completed chemotherapy and radiation. Diagnosed 2006 and 2010.  EXAM: CT CHEST WITH CONTRAST  TECHNIQUE: Multidetector CT imaging of the chest was performed during intravenous contrast administration.  CONTRAST:  80mL OMNIPAQUE IOHEXOL 300 MG/ML  SOLN  COMPARISON:  PET-CT 11/23/2012, most recent similar comparison chest CT 07/15/2010.  FINDINGS: The previously seen 4 mm right upper lobe pulmonary nodule now measures 2 mm and is therefore likely benign given its decrease in size since the prior exam more than 3 years ago. Compared to dissimilar prior exam, there has been increase in size of a presumed intrapulmonary lymph node measuring 3 mm in short axis image 18, in the right upper lobe. A possible intrapulmonary lymph node measuring 3 mm in short axis diameter versus a new pulmonary nodule is noted also in the right upper lobe image 17.  A new left lower lobe nodule measuring 0.9 cm image 44 is identified. Central airways are patent.  No new lytic or sclerotic osseous lesion is identified.  Bilateral submuscular breast implants are in place. No axillary or internal mammary artery chain lymphadenopathy. Heart size is normal. A new 1 cm pretracheal lymph node is  identified image 22.  At incomplete imaging of the upper abdomen, there an ill-defined hypodense 1.8 cm at the dome of the right hepatic lobe image 42.  IMPRESSION: New ill-defined right hepatic lobe lesion, not well characterized on this exam but concerning for development of possible metastasis in the setting of pretracheal lymphadenopathy, a new left lower lobe pulmonary nodule and enlarging right upper lobe pulmonary nodules or intramammary lymph nodes.  Dedicated CT abdomen/pelvis recommended for further evaluation. If findings do not demonstrate other conclusive evidence for intra-abdominal or pelvic metastatic disease, and the hepatic mass is not completely characterized at CT, then abdominal MRI may also be necessary for liver mass characterization.  Ultimately, PET-CT is recommended for assessment of these findings.  These results will be called to the ordering clinician or representative by the Radiologist Assistant, and communication documented in the PACS Dashboard.   Electronically Signed   By: Christiana Pellant M.D.   On: 11/21/2013 15:08   Mr Liver W Wo Contrast  11/22/2013   CLINICAL DATA:  Breast cancer.  Possible liver metastasis.  EXAM: MRI ABDOMEN WITHOUT AND WITH CONTRAST  TECHNIQUE: Multiplanar, multisequence MR imaging was performed both before and after administration of intravenous contrast.  CONTRAST:  18mL MULTIHANCE GADOBENATE DIMEGLUMINE 529 MG/ML IV SOLN  COMPARISON:  Chest CT 11/21/2013.  FINDINGS: There is an ill-defined right hepatic lobe region of the dome which measures approximately 19 x 15 mm. This demonstrates increased T2 signal intensity and contrast enhancement and is worrisome for a metastasis. Other peripheral wedge-shaped areas of enhancement are probably small vascular shunts. On the T2 weighted sequences there are 2 other small right hepatic lobe lesions posteriorly. These are best seen on series 3 image 24 and 25. These are very difficult to see on the post contrast  images.  The spleen is normal in size. No focal lesions. The pancreas is normal. The gallbladder is  normal. No common bile duct dilatation. The adrenal glands and kidneys are unremarkable. A small left renal cyst is noted.  The stomach, duodenum, visualized small bowel and visualized colon are grossly normal. There are enlarged retroperitoneal lymph nodes best demonstrated on the postcontrast images series at 1103 image 77 and image number 84.  IMPRESSION: 1. 19 x 15 mm right hepatic lobe metastatic lesion and 2 smaller right lobe lesions. 2. Retroperitoneal lymphadenopathy.   Electronically Signed   By: Loralie Champagne M.D.   On: 11/22/2013 14:28      ASSESSMENT: 47 y.o. [place] woman  (1)  status post left lumpectomy under the care of Dr. 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 2 of 23 lymph nodes were involved.  Margins were clear.  (2) status post adjuvant chemotherapy consisting of 6 q. three-week doses of docetaxel/ doxorubicin/ cyclophosphamide given between January 2007 and may of 2007, with last dose on 04/20/2006.  (3) status post radiation therapy to the left breast, completed 07/11/2006  (4) began on tamoxifen in early August 2007 and continued until October 2009. She status post hysterectomy with bilateral salpingo-oophorectomy on 07/15/2008, and was started on letrozole in October of 2009.  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 PET and CT of the chest showed no evidence of metastasis in January 2011.  (6) status post bilateral mastectomies 01/25/2010, with the right breast showing no evidence of malignancy; in the left breast showing a 1.0 cm grade 2 invasive ductal carcinoma with high-grade DCIS. Tumor was ER +22%, PR negative, HER-2/neu negative, with  proliferation marker of 13%. Margins were clear. Patient underwent concurrent left latissimus flap reconstruction and right implant reconstruction.  (7)  status post additional chemotherapy with one cycle of carboplatin/ gemcitabine given on 03/11/2010. She then received 5 q. three-week cycles of IV CMF between 03/25/2010 and 06/17/2010.  (8) started on exemestane in January 2012 and continued until late November 2013 when she was hospitalized for apparent TIA. There was apparently some question of whether or not the antiestrogen therapy could have contributed to this development, and she has not resumed antiestrogen therapy since November 2013.  (9)  evidence of likely disease progression noted on chest CT and liver MRI in December 2014, with suspicious lung nodules, lymphadenopathy, and 3 lesions in the right lobe of the liver.  (10) fulvestrant started 12/03/2013  PLAN: Smantha, grade and I discussed her situation for the better part of 45 minutes today. She understands that this point the studies indicate she has metastatic breast cancer, that is breast tissue growing in other organs such as lymph nodes in the mediastinum and near the liver, as well as in the liver itself. There are 2 areas in the lungs that may also be metastatic breast cancer. We don't see evidence of breast cancer in her bones or brain.  They understand metastatic breast cancer is not curable with our current knowledge base. It is however highly treatable. It will be important to find out whether the tumor is still estrogen receptor positive and whether it has changed to HER-2 positive, or whether it is now triple-negative is sometimes occur. Accordingly we are setting her up for liver biopsy as soon as possible. She understands the risk of pain and bleeding, but is very eager to have this test, understandably so.  Because her cancer was estrogen receptor positive previously, she has just about 90% chance of it still being  estrogen receptor positive. Accordingly the plan for treatment would be fulvestrant. She has a good understanding of the possible toxicities, side effects and complications, and she wished to get started today. I agree with that decision. Accordingly we are starting fulvestrant today, giving her a second dose in 2 weeks, and then the dose again in 4 weeks from now, after which time we will start doing every month.  I have advised her to 10 to avoid bread, past I., potatoes, and Rice in her diet as much as possible, and to increase the body of vegetables that she and the family eat. I have also asked her to start an exercise program with the goal being 45 minutes 5 times a week.  She'll see [place] in 2 weeks just to make sure she is tolerating the treatment well, and then again in 4 weeks, and then one of those sessions we should have the pathology from the liver biopsy did discuss. Jazaria has a good understanding of the overall plan. She is in agreement with it. She knows the goal of treatment is control. She will call with any problems that may develop before her next visit here.  Lowella Dell, MD   12/03/2013 2:04 PM

## 2013-12-03 NOTE — Telephone Encounter (Signed)
, °

## 2013-12-04 ENCOUNTER — Other Ambulatory Visit: Payer: Self-pay | Admitting: Oncology

## 2013-12-04 DIAGNOSIS — C50919 Malignant neoplasm of unspecified site of unspecified female breast: Secondary | ICD-10-CM

## 2013-12-04 LAB — URINE CULTURE

## 2013-12-04 NOTE — Progress Notes (Signed)
ID: Kristin Hoffman OB: 1966/10/22  MR#: [id number]  YQM#:[id number]  PCP: Albertina Senegal, MD GYN:  Harold Hedge SU: Cyndia Bent OTHER MD: Margaretmary Dys  CHIEF COMPLAINT:  Right  Breast Cancer x 2 occurrences    HISTORY OF PRESENT ILLNESS: This patient was previously followed by Dr. Pierce Crane, and is transferred to Dr. Darrall Dears service as of 08/20/2013.  At the age of 47, the patient had a screening mammogram as a baseline. She had recently given birth and was on birth control pills at that time. The mammogram in November 2006 showed an area of architectural distortion in the left lower inner quadrant. Subsequently an ultrasound was obtained of the left breast showing a 2.0 x 1.5 x 1.4 cm mass, at the 8:00 position, 4 cm from the nipple. A core biopsy performed on 10/21/2005 showed an invasive mammary carcinoma, ER +70%, PR +41%, HER-2/neu negative, with proliferation marker of 38%. [phone])  Breast MRI on 11/08/2005 confirmed a 2.5 cm spiculated mass in the left lower inner quadrant with 3 small satellite nodules adjacent to the primary mass: 3.5 cm posterior medial to the primary mass, measuring 9 mm; 1.5 cm anterior medial to the primary mass measuring 5 mm; and 2 cm medial to the primary mass measuring 5 mm. No axillary adenopathy was noted. No suspicious masses or enhancement are noted in the right breast.  Kristin Hoffman underwentt left lumpectomy under the care of Dr. 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 2 of 23 lymph nodes were involved.  Margins were clear.  She received adjuvant chemotherapy consisting of 6 q. three-week doses of docetaxel/doxorubicin/cyclophosphamide given between January 2007 and may of 2007, with last dose on 04/20/2006. She underwent radiation therapy completed 07/11/2006, after which she began on tamoxifen in early August 2007.  She underwent hysterectomy with  bilateral salpingo-oophorectomy on 07/15/2008, and was started on letrozole in October of 2009.  A mammogram 11/10/2009 showed microcalcifications in the left breast, and a subsequent biopsy on 11/18/2009 confirmed invasive mammary carcinoma in the left breast. PET and CT of the chest showed no evidence of metastasis in January 2011.  She underwent bilateral mastectomies 01/25/2010, with the right breast showing no evidence of malignancy; in the left breast showing a 1.0 cm grade 2 invasive ductal carcinoma with high-grade DCIS. Tumor was ER +22%, PR negative, HER-2/neu negative, with proliferation marker of 13%. Margins were clear. Patient underwent concurrent left latissimus flap reconstruction and right implant reconstruction.  She received additional chemotherapy with one cycle of carboplatin/gemcitabine given on 03/11/2010. She then received 5 q. three-week cycles of IV CMF between 03/25/2010 and 06/17/2010.  She was started on exemestane in January 2012 and continued until late November 2013 when she was hospitalized for apparent TIA.   Her subsequent history is as detailed below   INTERVAL HISTORY: Kristin Hoffman returns today for followup of her breast cancer, accompanied by her husband Kristin Hoffman. As noted in prior notes, the patient developed right-sided chest wall pain and was evaluated with a CT scan showing no obvious cause for her pain, which is likely postoperative and musculoskeletal, but with new liver lesions, pretracheal adenopathy, and a left lower lobe pulmonary nodule. She then had an abdominal MRI which confirmed at least 3 liver lesions, the largest being 1.9 cm. Finally she had a PET scan 12/02/2013. This confirmed hypermetabolic adenopathy in the chest, a left lower lobe and a left upper lobe  nodule both with mild hypermetabolic activity, the larger liver lesion to have an SUV max of 6.4 and significantly hypermetabolic periportal lymph nodes. There was no evidence of bony metastatic  disease.  REVIEW OF SYSTEMS: Kristin Hoffman's pain is now considerably improved. She denies unusual headaches, visual changes, nausea, vomiting, or dizziness. She sleeps poorly, having great difficulty falling asleep, although one she falls asleep she does fine. She is reluctant to take have not asked, and has tried a variety of herbal products, with little success. She has bladder irritation, and was treated with antibiotics for 10 days without resolution. She denies hot flashes and vaginal dryness is mild to moderate. She is anxious but not depressed. A detailed review of systems today was otherwise noncontributory   PAST MEDICAL HISTORY: Past Medical History  Diagnosis Date  . Breast cancer   . Depression   . Reflux   . Cancer   . Hx-TIA (transient ischemic attack) 11/22/2013  . Chronic headaches 11/22/2013  . Anxiety 11/22/2013    PAST SURGICAL HISTORY: Past Surgical History  Procedure Laterality Date  . Mastectomy w/ nodes partial    . Mastectomy    . Breast reconstruction    . Portacath placement      x 2  . Portacath removal      x 2  . Abdominal hysterectomy    . Tee without cardioversion  11/12/2012    Procedure: TRANSESOPHAGEAL ECHOCARDIOGRAM (TEE);  Surgeon: Donato Schultz, MD;  Location: [facility] ENDOSCOPY;  Service: Cardiovascular;  Laterality: N/A;  This TEE may be Dr. Anne Fu or a LHC Dr    FAMILY HISTORY Both parents are alive and well. Patient has one sister who is 10 years younger and is in good health. No other history of breast or ovarian cancer in the family. No family history on file.  GYNECOLOGIC HISTORY:  G2P2, menarche at age 75 with irregular menses. On birth control pills in the past. On Clomid to induce ovulation with first pregnancy. Also had preeclampsia with first pregnancy. Status post hysterectomy and bilateral salpingo-oophorectomy in August 2009.  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 She is originally from [place]. She's been married to her husband, Kristin Hoffman, for 13 years. He works as an Multimedia programmer at [employer].   ADVANCED DIRECTIVES:    HEALTH MAINTENANCE:  (Updated 11/22/2013) History  Substance Use Topics  . Smoking status: Never Smoker   . Smokeless tobacco: Never Used  . Alcohol Use: Yes     Comment: rarely     Colonoscopy:  Never  PAP: S/P LAVH/BSO in August 2009  Bone density: Never  Lipid panel: Not on file   No Known Allergies  Current Outpatient Prescriptions  Medication Sig Dispense Refill  . aspirin 81 MG tablet Take 81 mg by mouth daily.      . cetirizine (ZYRTEC) 10 MG tablet Take 10 mg by mouth daily.        . cholecalciferol (VITAMIN D) 400 UNITS TABS tablet Take 1,000 Units by mouth daily.      Kristin Hoffman Kitchen escitalopram (LEXAPRO) 20 MG tablet Take 20 mg by mouth daily.        Kristin Hoffman Kitchen LORazepam (ATIVAN) 0.5 MG tablet Take 2 tablets (1 mg total) by mouth every 8 (eight) hours.  60 tablet  3  . Multiple Vitamin (MULTIVITAMIN) tablet Take 1 tablet by mouth daily.      Kristin Hoffman Kitchen  omeprazole (PRILOSEC) 40 MG capsule       . zolpidem (AMBIEN) 10 MG tablet        Current Facility-Administered Medications  Medication Dose Route Frequency Provider Last Rate Last Dose  . fulvestrant (FASLODEX) injection 500 mg  500 mg Intramuscular Q14 Days Lowella Dell, MD   500 mg at 12/03/13 1458    OBJECTIVE: Young white woman in no acute distress Filed Vitals:   12/03/13 1400  BP: 153/98  Pulse: 88  Temp: 98.3 F (36.8 C)  Resp: 18     Body mass index is 32.8 kg/(m^2).    ECOG FS: 1 Filed Weights   12/03/13 1400  Weight: 191 lb 3.2 oz (86.728 kg)   Sclerae unicteric, pupils equal and reactive Oropharynx clear and moist-- no thrush No cervical or supraclavicular adenopathy Lungs no rales or rhonchi Heart regular rate and rhythm Abd soft, nontender, positive bowel sounds MSK no focal spinal tenderness, no upper extremity lymphedema; the right  chest wall is not tender to palpation Neuro: nonfocal, well oriented, appropriate affect Breasts: Status post bilateral mastectomies. There is no evidence of local recurrence. Both axillae are benign.     LAB RESULTS:  Lab Results  Component Value Date   WBC 4.5 11/22/2013   NEUTROABS 3.2 11/22/2013   HGB 14.1 11/22/2013   HCT 42.0 11/22/2013   MCV 91.9 11/22/2013   PLT 176 11/22/2013      Chemistry      Component Value Date/Time   NA 140 11/22/2013 0951   NA 142 11/12/2012 0735   K 4.2 11/22/2013 0951   K 3.8 11/12/2012 0735   CL 104 11/14/2012 1407   CL 104 11/12/2012 0735   CO2 26 11/22/2013 0951   CO2 30 11/12/2012 0735   BUN 14.1 11/22/2013 0951   BUN 13 11/12/2012 0735   CREATININE 0.9 11/22/2013 0951   CREATININE 0.97 11/12/2012 0735      Component Value Date/Time   CALCIUM 9.6 11/22/2013 0951   CALCIUM 9.1 11/12/2012 0735   ALKPHOS 68 11/22/2013 0951   ALKPHOS 69 11/12/2012 0735   AST 28 11/22/2013 0951   AST 16 11/12/2012 0735   ALT 18 11/22/2013 0951   ALT 13 11/12/2012 0735   BILITOT 0.63 11/22/2013 0951   BILITOT 0.9 11/12/2012 0735      STUDIES: Ct Chest W Contrast  11/21/2013   CLINICAL DATA:  Chest pain, history of breast cancer. Completed chemotherapy and radiation. Diagnosed 2006 and 2010.  EXAM: CT CHEST WITH CONTRAST  TECHNIQUE: Multidetector CT imaging of the chest was performed during intravenous contrast administration.  CONTRAST:  80mL OMNIPAQUE IOHEXOL 300 MG/ML  SOLN  COMPARISON:  PET-CT 11/23/2012, most recent similar comparison chest CT 07/15/2010.  FINDINGS: The previously seen 4 mm right upper lobe pulmonary nodule now measures 2 mm and is therefore likely benign given its decrease in size since the prior exam more than 3 years ago. Compared to dissimilar prior exam, there has been increase in size of a presumed intrapulmonary lymph node measuring 3 mm in short axis image 18, in the right upper lobe. A possible intrapulmonary lymph node measuring 3  mm in short axis diameter versus a new pulmonary nodule is noted also in the right upper lobe image 17.  A new left lower lobe nodule measuring 0.9 cm image 44 is identified. Central airways are patent.  No new lytic or sclerotic osseous lesion is identified.  Bilateral submuscular breast implants are in place. No axillary  or internal mammary artery chain lymphadenopathy. Heart size is normal. A new 1 cm pretracheal lymph node is identified image 22.  At incomplete imaging of the upper abdomen, there an ill-defined hypodense 1.8 cm at the dome of the right hepatic lobe image 42.  IMPRESSION: New ill-defined right hepatic lobe lesion, not well characterized on this exam but concerning for development of possible metastasis in the setting of pretracheal lymphadenopathy, a new left lower lobe pulmonary nodule and enlarging right upper lobe pulmonary nodules or intramammary lymph nodes.  Dedicated CT abdomen/pelvis recommended for further evaluation. If findings do not demonstrate other conclusive evidence for intra-abdominal or pelvic metastatic disease, and the hepatic mass is not completely characterized at CT, then abdominal MRI may also be necessary for liver mass characterization.  Ultimately, PET-CT is recommended for assessment of these findings.  These results will be called to the ordering clinician or representative by the Radiologist Assistant, and communication documented in the PACS Dashboard.   Electronically Signed   By: Christiana Pellant M.D.   On: 11/21/2013 15:08   Mr Liver W Wo Contrast  11/22/2013   CLINICAL DATA:  Breast cancer.  Possible liver metastasis.  EXAM: MRI ABDOMEN WITHOUT AND WITH CONTRAST  TECHNIQUE: Multiplanar, multisequence MR imaging was performed both before and after administration of intravenous contrast.  CONTRAST:  18mL MULTIHANCE GADOBENATE DIMEGLUMINE 529 MG/ML IV SOLN  COMPARISON:  Chest CT 11/21/2013.  FINDINGS: There is an ill-defined right hepatic lobe region of the  dome which measures approximately 19 x 15 mm. This demonstrates increased T2 signal intensity and contrast enhancement and is worrisome for a metastasis. Other peripheral wedge-shaped areas of enhancement are probably small vascular shunts. On the T2 weighted sequences there are 2 other small right hepatic lobe lesions posteriorly. These are best seen on series 3 image 24 and 25. These are very difficult to see on the post contrast images.  The spleen is normal in size. No focal lesions. The pancreas is normal. The gallbladder is normal. No common bile duct dilatation. The adrenal glands and kidneys are unremarkable. A small left renal cyst is noted.  The stomach, duodenum, visualized small bowel and visualized colon are grossly normal. There are enlarged retroperitoneal lymph nodes best demonstrated on the postcontrast images series at 1103 image 77 and image number 84.  IMPRESSION: 1. 19 x 15 mm right hepatic lobe metastatic lesion and 2 smaller right lobe lesions. 2. Retroperitoneal lymphadenopathy.   Electronically Signed   By: Loralie Champagne M.D.   On: 11/22/2013 14:28      ASSESSMENT: 47 y.o. [place] woman  (1)  status post left lumpectomy under the care of Dr. 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 2 of 23 lymph nodes were involved.  Margins were clear.  (2) status post adjuvant chemotherapy consisting of 6 q. three-week doses of docetaxel/doxorubicin/cyclophosphamide given between January 2007 and may of 2007, with last dose on 04/20/2006.  (3) status post radiation therapy to the left breast, completed 07/11/2006  (4) began on tamoxifen in early August 2007 and continued until October 2009. She status post hysterectomy with bilateral salpingo-oophorectomy on 07/15/2008, and was started on letrozole in October of 2009.  (5) mammogram 11/10/2009 showed microcalcifications in the left  breast, and a subsequent biopsy on 11/18/2009 confirmed invasive mammary carcinoma in the left breast. PET and CT of the chest showed no evidence of metastasis in January  2011.  (6) status post bilateral mastectomies 01/25/2010, with the right breast showing no evidence of malignancy; in the left breast showing a 1.0 cm grade 2 invasive ductal carcinoma with high-grade DCIS. Tumor was ER +22%, PR negative, HER-2/neu negative, with proliferation marker of 13%. Margins were clear. Patient underwent concurrent left latissimus flap reconstruction and right implant reconstruction.  (7)  status post additional chemotherapy with one cycle of carboplatin/gemcitabine given on 03/11/2010. She then received 5 q. three-week cycles of IV CMF between 03/25/2010 and 06/17/2010.  (8) started on exemestane in January 2012 and continued until late November 2013 when she was hospitalized for apparent TIA. There was apparently some question of whether or not the antiestrogen therapy could have contributed to this development, and she has not resumed antiestrogen therapy since November 2013.  (9)  evidence of disease progression noted on chest CT, liver MRI and PET scan in December 2014, with suspicious lung nodules, lymphadenopathy, and 3 lesions in the right lobe of the liver, but no evidence of bony disease and no evidence of brain involvement by brain MRI September 2014  (10) fulvestrant started 12/03/2013   PLAN: We spent the better part of today's 15 minute appointment discussing the overall situation. Everything indicates stage IV disease and I did explain to Kristin Hoffman and her husband Kristin Hoffman that unfortunately we do not not a cure stage IV disease with her current knowledge base. Accordingly the goal of treatment is control. A secondary goal is to try to inconvenience the patient as little as possible, so she can maintain as normal a lifestyle as possible.  For that reason we are starting with anti-estrogens,  specifically fulvestrant. The patient understands this is not a form of chemotherapy but a form of hormone therapy. It can be very effective and is generally very well tolerated aside from the discomfort of receiving the injections once a month. An additional doses given in the first month, at 2 weeks, to increase the blood level faster.  Of course fulvestrant only works for estrogen receptor positive tumors. The patient's earlier breast cancer biopsies did show estrogen receptor positivity, but some tumors lose estrogen receptors in their metastatic sites. For this reason she will need confirmatory biopsy, and this is being set up in the next week or so. Ideally a liver biopsy would be obtained and sent for the full prognostic panel (estrogen and progesterone receptors as well as HER-2 amplification).  Even without this confirmation, Merisa was eager to get started on treatment, and accordingly she received her first fulvestrant dose today. She will return in 2 weeks for her second dose and by then we will have at least preliminary reports from her biopsy. We should have the full report with a prognostic panel by that time she returns to see me 4 weeks from now.  Caralina has a good understanding of this overall plan and is in agreement with it. She knows the goal of treatment is control, as discussed. She will call with any problems that may develop before her next visit here.  Lowella Dell, MD   12/04/2013 12:16 PM

## 2013-12-09 ENCOUNTER — Other Ambulatory Visit: Payer: Self-pay | Admitting: Radiology

## 2013-12-10 ENCOUNTER — Encounter (HOSPITAL_COMMUNITY): Payer: Self-pay | Admitting: Pharmacy Technician

## 2013-12-10 ENCOUNTER — Other Ambulatory Visit: Payer: Self-pay | Admitting: Oncology

## 2013-12-11 ENCOUNTER — Ambulatory Visit (HOSPITAL_COMMUNITY)
Admission: RE | Admit: 2013-12-11 | Discharge: 2013-12-11 | Disposition: A | Discharge status: Home / Self Care | Payer: BC Managed Care – PPO | Source: Ambulatory Visit | Attending: Oncology | Admitting: Oncology

## 2013-12-11 ENCOUNTER — Other Ambulatory Visit: Payer: Self-pay | Admitting: Oncology

## 2013-12-11 ENCOUNTER — Encounter (HOSPITAL_COMMUNITY): Payer: Self-pay

## 2013-12-11 DIAGNOSIS — F3289 Other specified depressive episodes: Secondary | ICD-10-CM | POA: Insufficient documentation

## 2013-12-11 DIAGNOSIS — F329 Major depressive disorder, single episode, unspecified: Secondary | ICD-10-CM | POA: Insufficient documentation

## 2013-12-11 DIAGNOSIS — C50919 Malignant neoplasm of unspecified site of unspecified female breast: Secondary | ICD-10-CM | POA: Insufficient documentation

## 2013-12-11 DIAGNOSIS — K7689 Other specified diseases of liver: Secondary | ICD-10-CM | POA: Insufficient documentation

## 2013-12-11 DIAGNOSIS — Z8673 Personal history of transient ischemic attack (TIA), and cerebral infarction without residual deficits: Secondary | ICD-10-CM | POA: Insufficient documentation

## 2013-12-11 DIAGNOSIS — C770 Secondary and unspecified malignant neoplasm of lymph nodes of head, face and neck: Secondary | ICD-10-CM | POA: Insufficient documentation

## 2013-12-11 DIAGNOSIS — F411 Generalized anxiety disorder: Secondary | ICD-10-CM | POA: Insufficient documentation

## 2013-12-11 DIAGNOSIS — Z79899 Other long term (current) drug therapy: Secondary | ICD-10-CM | POA: Insufficient documentation

## 2013-12-11 DIAGNOSIS — K219 Gastro-esophageal reflux disease without esophagitis: Secondary | ICD-10-CM | POA: Insufficient documentation

## 2013-12-11 DIAGNOSIS — Z7982 Long term (current) use of aspirin: Secondary | ICD-10-CM | POA: Insufficient documentation

## 2013-12-11 DIAGNOSIS — Z9010 Acquired absence of unspecified breast and nipple: Secondary | ICD-10-CM | POA: Insufficient documentation

## 2013-12-11 LAB — CBC
HCT: 40.9 % (ref 36.0–46.0)
Hemoglobin: 13.5 g/dL (ref 12.0–15.0)
MCHC: 33 g/dL (ref 30.0–36.0)
MCV: 91.1 fL (ref 78.0–100.0)
RBC: 4.49 MIL/uL (ref 3.87–5.11)
RDW: 12.6 % (ref 11.5–15.5)
WBC: 4.6 10*3/uL (ref 4.0–10.5)

## 2013-12-11 LAB — PROTIME-INR
INR: 0.93 (ref 0.00–1.49)
Prothrombin Time: 12.3 seconds (ref 11.6–15.2)

## 2013-12-11 LAB — APTT: aPTT: 29 seconds (ref 24–37)

## 2013-12-11 MED ORDER — MIDAZOLAM HCL 2 MG/2ML IJ SOLN
INTRAMUSCULAR | Status: AC
  Filled 2013-12-11: qty 2

## 2013-12-11 MED ORDER — FENTANYL CITRATE 0.05 MG/ML IJ SOLN
INTRAMUSCULAR | Status: AC | PRN
  Administered 2013-12-11: 100 ug via INTRAVENOUS

## 2013-12-11 MED ORDER — SODIUM CHLORIDE 0.9 % IV SOLN
INTRAVENOUS | Status: DC
  Administered 2013-12-11: 11:00:00 via INTRAVENOUS

## 2013-12-11 MED ORDER — MIDAZOLAM HCL 2 MG/2ML IJ SOLN
INTRAMUSCULAR | Status: AC | PRN
  Administered 2013-12-11: 2 mg via INTRAVENOUS

## 2013-12-11 MED ORDER — HYDROCODONE-ACETAMINOPHEN 5-325 MG PO TABS
1.0000 | ORAL_TABLET | ORAL | Status: DC | PRN
  Administered 2013-12-11 (×2): 1 via ORAL
  Filled 2013-12-11 (×3): qty 2

## 2013-12-11 MED ORDER — FENTANYL CITRATE 0.05 MG/ML IJ SOLN
INTRAMUSCULAR | Status: AC
  Filled 2013-12-11: qty 2

## 2013-12-11 NOTE — H&P (Signed)
Chief Complaint: "I am here for a liver biopsy." Referring Physician: Dr. Darnelle Catalan HPI: Kristin Hoffman is an 47 y.o. female with history of breast cancer s/p left lumpectomy 2006 with recurrence and s/p bilateral mastectomy 2011. The patient recently c/o chest pain and had imaging studies which revealed evidence of progression of disease. Dr. Darnelle Catalan has requested an image guided biopsy. She denies any active chest pain or shortness of breath. She denies any bleeding, fever or chills. She denies any history of sleep apnea or use of oxygen. She denies any difficulty with previous sedation during endoscopy.   Past Medical History:  Past Medical History  Diagnosis Date  . Breast cancer   . Depression   . Reflux   . Cancer   . Hx-TIA (transient ischemic attack) 11/22/2013  . Chronic headaches 11/22/2013  . Anxiety 11/22/2013    Past Surgical History:  Past Surgical History  Procedure Laterality Date  . Mastectomy w/ nodes partial    . Mastectomy    . Breast reconstruction    . Portacath placement      x 2  . Portacath removal      x 2  . Abdominal hysterectomy    . Tee without cardioversion  11/12/2012    Procedure: TRANSESOPHAGEAL ECHOCARDIOGRAM (TEE);  Surgeon: Donato Schultz, MD;  Location: [facility] ENDOSCOPY;  Service: Cardiovascular;  Laterality: N/A;  This TEE may be Dr. Anne Fu or a LHC Dr    Family History: History reviewed. No pertinent family history.  Social History:  reports that she has never smoked. She has never used smokeless tobacco. She reports that she drinks alcohol. She reports that she does not use illicit drugs.  Allergies: No Known Allergies    Medication List    ASK your doctor about these medications       aspirin 81 MG tablet  Take 81 mg by mouth daily.     cetirizine 10 MG tablet  Commonly known as:  ZYRTEC  Take 10 mg by mouth daily.     cholecalciferol 400 UNITS Tabs tablet  Commonly known as:  VITAMIN D  Take 1,000 Units by mouth daily.      escitalopram 20 MG tablet  Commonly known as:  LEXAPRO  Take 20 mg by mouth daily with breakfast.     LORazepam 0.5 MG tablet  Commonly known as:  ATIVAN  Take 1 mg by mouth every 4 (four) hours as needed for anxiety.     multivitamin tablet  Take 1 tablet by mouth daily.     omeprazole 40 MG capsule  Commonly known as:  PRILOSEC  Take 40 mg by mouth daily.     zolpidem 10 MG tablet  Commonly known as:  AMBIEN  Take 10 mg by mouth at bedtime.       Please HPI for pertinent positives, otherwise complete 10 system ROS negative.  Physical Exam: BP 137/87  Pulse 95  Temp(Src) 98.6 F (37 C) (Oral)  Resp 18  Ht 5\' 4"  (1.626 m)  Wt 190 lb (86.183 kg)  BMI 32.60 kg/m2  SpO2 98% Body mass index is 32.6 kg/(m^2).  General Appearance:  Alert, cooperative, no distress  Head:  Normocephalic, without obvious abnormality, atraumatic  Neck: Supple, symmetrical, trachea midline  Lungs:   Clear to auscultation bilaterally, no w/r/r, respirations unlabored without use of accessory muscles.  Chest Wall:  No tenderness or deformity  Heart:  Regular rate and rhythm, S1, S2 normal, no murmur, rub or gallop.  Abdomen:  Soft, non-tender, non distended, (+) BS  Extremities: Extremities normal, atraumatic, no cyanosis or edema  Pulses: 2+ and symmetric  Neurologic: Normal affect, no gross deficits.   Results for orders placed during the hospital encounter of 12/11/13 (from the past 48 hour(s))  APTT     Status: None   Collection Time    12/11/13 11:20 AM      Result Value Range   aPTT 29  24 - 37 seconds  CBC     Status: None   Collection Time    12/11/13 11:20 AM      Result Value Range   WBC 4.6  4.0 - 10.5 K/uL   RBC 4.49  3.87 - 5.11 MIL/uL   Hemoglobin 13.5  12.0 - 15.0 g/dL   HCT 09.8  11.9 - 14.7 %   MCV 91.1  78.0 - 100.0 fL   MCH 30.1  26.0 - 34.0 pg   MCHC 33.0  30.0 - 36.0 g/dL   RDW 82.9  56.2 - 13.0 %   Platelets 204  150 - 400 K/uL  PROTIME-INR     Status: None    Collection Time    12/11/13 11:20 AM      Result Value Range   Prothrombin Time 12.3  11.6 - 15.2 seconds   INR 0.93  0.00 - 1.49   No results found.  Assessment/Plan History of breast cancer s/p left lumpectomy 2006, reoccurrence s/p bilateral mastectomy 2011 CT, MRI and PET reveal evidence of disease progression. Request for image guided liver lesion biopsy after images have been reviewed by Dr. Lowella Dandy we will proceed today with an image guided supraclavicular lymph node biopsy as she does demonstrate lymphadenopathy and this area is (+) hypermetabolic on PET.   Patient has been NPO, no blood thinners. Risks and Benefits discussed with the patient. All of the patient's questions were answered, patient is agreeable to proceed. Consent signed and in chart.   [name] D PA-C 12/11/2013, 11:53 AM

## 2013-12-11 NOTE — Procedures (Signed)
US guided core biopsies (5) of left supraclavicular lymph node conglomeration.  Samples placed in saline.  No immediate complication.

## 2013-12-16 ENCOUNTER — Other Ambulatory Visit: Payer: Self-pay | Admitting: Oncology

## 2013-12-17 ENCOUNTER — Ambulatory Visit (HOSPITAL_BASED_OUTPATIENT_CLINIC_OR_DEPARTMENT_OTHER): Payer: BC Managed Care – PPO | Admitting: Physician Assistant

## 2013-12-17 ENCOUNTER — Other Ambulatory Visit (HOSPITAL_BASED_OUTPATIENT_CLINIC_OR_DEPARTMENT_OTHER): Payer: BC Managed Care – PPO

## 2013-12-17 ENCOUNTER — Ambulatory Visit: Payer: BC Managed Care – PPO

## 2013-12-17 ENCOUNTER — Encounter: Payer: Self-pay | Admitting: Physician Assistant

## 2013-12-17 VITALS — BP 122/82 | HR 81 | Temp 98.6°F | Resp 16 | Wt 193.1 lb

## 2013-12-17 DIAGNOSIS — F419 Anxiety disorder, unspecified: Secondary | ICD-10-CM

## 2013-12-17 DIAGNOSIS — Z8673 Personal history of transient ischemic attack (TIA), and cerebral infarction without residual deficits: Secondary | ICD-10-CM

## 2013-12-17 DIAGNOSIS — F329 Major depressive disorder, single episode, unspecified: Secondary | ICD-10-CM

## 2013-12-17 DIAGNOSIS — G4700 Insomnia, unspecified: Secondary | ICD-10-CM

## 2013-12-17 DIAGNOSIS — Z5111 Encounter for antineoplastic chemotherapy: Secondary | ICD-10-CM

## 2013-12-17 DIAGNOSIS — C50312 Malignant neoplasm of lower-inner quadrant of left female breast: Secondary | ICD-10-CM

## 2013-12-17 DIAGNOSIS — C50319 Malignant neoplasm of lower-inner quadrant of unspecified female breast: Secondary | ICD-10-CM

## 2013-12-17 DIAGNOSIS — K7689 Other specified diseases of liver: Secondary | ICD-10-CM

## 2013-12-17 DIAGNOSIS — C770 Secondary and unspecified malignant neoplasm of lymph nodes of head, face and neck: Secondary | ICD-10-CM

## 2013-12-17 DIAGNOSIS — F3289 Other specified depressive episodes: Secondary | ICD-10-CM

## 2013-12-17 LAB — CBC WITH DIFFERENTIAL/PLATELET
BASO%: 0.7 % (ref 0.0–2.0)
Basophils Absolute: 0 10*3/uL (ref 0.0–0.1)
EOS ABS: 0.1 10*3/uL (ref 0.0–0.5)
EOS%: 1 % (ref 0.0–7.0)
HEMATOCRIT: 42.2 % (ref 34.8–46.6)
HGB: 14 g/dL (ref 11.6–15.9)
LYMPH%: 22.3 % (ref 14.0–49.7)
MCH: 30.6 pg (ref 25.1–34.0)
MCHC: 33.3 g/dL (ref 31.5–36.0)
MCV: 92 fL (ref 79.5–101.0)
MONO#: 0.5 10*3/uL (ref 0.1–0.9)
MONO%: 7.6 % (ref 0.0–14.0)
NEUT%: 68.4 % (ref 38.4–76.8)
NEUTROS ABS: 4.1 10*3/uL (ref 1.5–6.5)
PLATELETS: 223 10*3/uL (ref 145–400)
RBC: 4.59 10*6/uL (ref 3.70–5.45)
RDW: 13 % (ref 11.2–14.5)
WBC: 6 10*3/uL (ref 3.9–10.3)
lymph#: 1.3 10*3/uL (ref 0.9–3.3)

## 2013-12-17 LAB — URINALYSIS, MICROSCOPIC - CHCC
Bilirubin (Urine): NEGATIVE
GLUCOSE UR CHCC: NEGATIVE mg/dL
Ketones: NEGATIVE mg/dL
Leukocyte Esterase: NEGATIVE
Nitrite: NEGATIVE
PROTEIN: NEGATIVE mg/dL
Specific Gravity, Urine: 1.025 (ref 1.003–1.035)
UROBILINOGEN UR: 0.2 mg/dL (ref 0.2–1)
pH: 6 (ref 4.6–8.0)

## 2013-12-17 MED ORDER — FULVESTRANT 250 MG/5ML IM SOLN
500.0000 mg | INTRAMUSCULAR | Status: DC
Start: 2013-12-17 — End: 2013-12-18
  Administered 2013-12-17: 500 mg via INTRAMUSCULAR
  Filled 2013-12-17: qty 10

## 2013-12-17 NOTE — Progress Notes (Signed)
Faslodex injections given during office visit.

## 2013-12-18 NOTE — Progress Notes (Signed)
ID: Kristin Hoffman OB: Apr 25, 1966  MR#: [id number]  YYT#:[id number]  PCP: Drake Leach, MD GYN:  [name] SU: [name] OTHER MD: [name]  CHIEF COMPLAINT:  Right Breast Cancer x 2 occurrences, now with metastatic disease as of December 2014   HISTORY OF PRESENT ILLNESS: This patient was previously followed by [name], and is transferred to [name] service as of 08/20/2013.  At the age of 9, the patient had a screening mammogram as a baseline. She had recently given birth and was on birth control pills at that time. The mammogram in November 2006 showed an area of architectural distortion in the left lower inner quadrant. Subsequently an ultrasound was obtained of the left breast showing a 2.0 x 1.5 x 1.4 cm mass, at the 8:00 position, 4 cm from the nipple. A core biopsy performed on 10/21/2005 showed an invasive mammary carcinoma, ER +70%, PR +41%, HER-2/neu negative, with proliferation marker of 38%. [id number]  Breast MRI on 11/08/2005 confirmed a 2.5 cm spiculated mass in the left lower inner quadrant with 3 small satellite nodules adjacent to the primary mass: 3.5 cm posterior medial to the primary mass, measuring 9 mm; 1.5 cm anterior medial to the primary mass measuring 5 mm; and 2 cm medial to the primary mass measuring 5 mm. No axillary adenopathy was noted. No suspicious masses or enhancement are noted in the right breast.  Kristin Hoffman underwentt left lumpectomy under the care of Dr. 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 2 of 23 lymph nodes were involved.  Margins were clear.  She received adjuvant chemotherapy consisting of 6 q. three-week doses of docetaxel/doxorubicin/cyclophosphamide given between January 2007 and may of 2007, with last dose on 04/20/2006. She underwent radiation therapy completed 07/11/2006, after which she began on tamoxifen in early  August 2007.  She underwent hysterectomy with bilateral salpingo-oophorectomy on 07/15/2008, and was started on letrozole in October of 2009.  A mammogram 11/10/2009 showed microcalcifications in the left breast, and a subsequent biopsy on 11/18/2009 confirmed invasive mammary carcinoma in the left breast. PET and CT of the chest showed no evidence of metastasis in January 2011.  She underwent bilateral mastectomies 01/25/2010, with the right breast showing no evidence of malignancy; in the left breast showing a 1.0 cm grade 2 invasive ductal carcinoma with high-grade DCIS. Tumor was ER +22%, PR negative, HER-2/neu negative, with proliferation marker of 13%. Margins were clear. Patient underwent concurrent left latissimus flap reconstruction and right implant reconstruction.  She received additional chemotherapy with one cycle of carboplatin/gemcitabine given on 03/11/2010. She then received 5 q. three-week cycles of IV CMF between 03/25/2010 and 06/17/2010.  She was started on exemestane in January 2012 and continued until late November 2013 when she was hospitalized for apparent TIA.   Her subsequent history is as detailed below   INTERVAL HISTORY: Kristin Hoffman returns alone today for followup of her metastatic breast cancer.  She was started on fulvestrant injections when she saw [name] 2 weeks ago, first injections given on 12/03/2013. She is due for her next injections today. She tells me she tolerated the injections well 2 weeks ago and noted no associated side effects. She's had no hot flashes. She had no pain in the buttocks. In fact overall she is feeling well with the exception of some increased insomnia and anxiety.  She had a great holiday with her family, including her  two young sons.  Interval history is notable for Kristin Hoffman having had a biopsy of a left supraclavicular lymph node on 12/11/2013. The pathology did in fact confirm metastatic carcinoma, but the prognostic panel is still pending as  of today.  With the exception of some residual bruising, she tolerated the procedure well, and has had no complications.  Kristin Hoffman is interested in utilizing some essential oils as adjunct therapy for her cancer.  She is aware that we do not have much data to support the use of these products, and that she would basically be using them "at her own risk".   REVIEW OF SYSTEMS: Kristin Hoffman has had no illnesses and denies any fevers or chills.  Her energy level is "normal" as is her appetite. She's had no problems with nausea or change in bowel habits. She recently completed a course of antibiotics for apparent urinary tract infection, but is still having some mild dysuria although it has improved. She denies any hematuria. She denies any cough, shortness of breath, chest pain, or palpitations. She still has some occasional pain in the right chest wall which is thought to be postsurgical, but this is stable to slightly improved. She denies any additional pain, specifically no abdominal pain and no unusual myalgias, arthralgias, or bony pain. She's had no peripheral swelling. She denies any abnormal headaches, dizziness, or change in vision.  A detailed review of systems is otherwise stable and noncontributory.   PAST MEDICAL HISTORY: Past Medical History  Diagnosis Date  . Breast cancer   . Depression   . Reflux   . Cancer   . Hx-TIA (transient ischemic attack) 11/22/2013  . Chronic headaches 11/22/2013  . Anxiety 11/22/2013    PAST SURGICAL HISTORY: Past Surgical History  Procedure Laterality Date  . Mastectomy w/ nodes partial    . Mastectomy    . Breast reconstruction    . Portacath placement      x 2  . Portacath removal      x 2  . Abdominal hysterectomy    . Tee without cardioversion  11/12/2012    Procedure: TRANSESOPHAGEAL ECHOCARDIOGRAM (TEE);  Surgeon: Candee Furbish, MD;  Location: [facility] ENDOSCOPY;  Service: Cardiovascular;  Laterality: N/A;  This TEE may be Dr. Marlou Porch or a LHC Dr     FAMILY HISTORY Both parents are alive and well. Patient has one sister who is 19 years younger and is in good health. No other history of breast or ovarian cancer in the family. History reviewed. No pertinent family history.  GYNECOLOGIC HISTORY:   (Updated January 2015) G2P2, menarche at age 83 with irregular menses. On birth control pills in the past. On Clomid to induce ovulation with first pregnancy. Also had preeclampsia with first pregnancy. Status post hysterectomy and bilateral salpingo-oophorectomy in August 2009.  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 She is originally from [place]. She's been married to her husband, [name], for 13 years. He works as an Pharmacist, hospital at [employer].   ADVANCED DIRECTIVES:    HEALTH MAINTENANCE:  (Updated 11/22/2013) History  Substance Use Topics  . Smoking status: Never Smoker   . Smokeless tobacco: Never Used  . Alcohol Use: Yes     Comment: rarely     Colonoscopy:  Never  PAP: S/P LAVH/BSO in August 2009  Bone density: Never  Lipid panel: Not on file   No Known Allergies  Current Outpatient Prescriptions  Medication Sig Dispense Refill  . aspirin 81 MG tablet Take 81 mg by mouth daily.      . cetirizine (ZYRTEC) 10 MG tablet Take 10 mg by mouth daily.        . cholecalciferol (VITAMIN D) 400 UNITS TABS tablet Take 1,000 Units by mouth daily.      Marland Kitchen escitalopram (LEXAPRO) 20 MG tablet Take 20 mg by mouth daily with breakfast.       . LORazepam (ATIVAN) 0.5 MG tablet Take 1 mg by mouth every 4 (four) hours as needed for anxiety.      . Multiple Vitamin (MULTIVITAMIN) tablet Take 1 tablet by mouth daily.      Marland Kitchen omeprazole (PRILOSEC) 40 MG capsule Take 40 mg by mouth daily.       Marland Kitchen zolpidem (AMBIEN) 10 MG tablet Take 10 mg by mouth at bedtime.        Current Facility-Administered Medications  Medication Dose Route Frequency  Provider Last Rate Last Dose  . fulvestrant (FASLODEX) injection 500 mg  500 mg Intramuscular Q14 Days Kristin Hoffman Milda Smart, PA-C   500 mg at 12/17/13 1615    OBJECTIVE: Young white woman in no acute distress Filed Vitals:   12/17/13 1002  BP: 122/82  Pulse: 81  Temp: 98.6 F (37 C)  Resp: 16  Body mass index is 33.13 kg/(m^2).  ECOG: 0 Filed Weights   12/17/13 1002  Weight: 193 lb 1.6 oz (87.59 kg)   Physical Exam: HEENT:  Sclerae anicteric.  Oropharynx clear and moist. Neck supple. Status post recent biopsy to a left supraclavicular lymph node with some residual ecchymoses. No erythema, bleeding, or evidence of infection noted. No significant tenderness to gentle palpation of the area. NODES:  No cervical or supraclavicular lymphadenopathy palpated.  BREAST EXAM:  Deferred. Axillae are benign bilaterally, with no palpable lymphadenopathy. LUNGS:  Clear to auscultation bilaterally.  No wheezes or rhonchi HEART:  Regular rate and rhythm. No murmur  ABDOMEN:  Soft, nontender.  No hepatomegaly palpated. Positive bowel sounds.  MSK:  No focal spinal tenderness to palpation. Good range of motion bilaterally in the upper extremities. EXTREMITIES:  No peripheral edema.   SKIN:  Benign with the exception of ecchymoses in the left supraclavicular region as noted above, status post recent biopsy. No obvious skin rashes or skin lesions noted. NEURO:  Nonfocal. Well oriented.  Positive affect.      LAB RESULTS:  Lab Results  Component Value Date   WBC 6.0 12/17/2013   NEUTROABS 4.1 12/17/2013   HGB 14.0 12/17/2013   HCT 42.2 12/17/2013   MCV 92.0 12/17/2013   PLT 223 12/17/2013      Chemistry      Component Value Date/Time   NA 140 11/22/2013 0951   NA 142 11/12/2012 0735   K 4.2 11/22/2013 0951   K 3.8 11/12/2012 0735   CL 104 11/14/2012 1407   CL 104 11/12/2012 0735   CO2 26 11/22/2013 0951   CO2 30 11/12/2012 0735   BUN 14.1 11/22/2013 0951   BUN 13 11/12/2012 0735   CREATININE 0.9 11/22/2013  0951   CREATININE 0.97 11/12/2012 0735      Component Value Date/Time   CALCIUM 9.6 11/22/2013 0951   CALCIUM 9.1 11/12/2012 0735   ALKPHOS 68 11/22/2013 0951   ALKPHOS 69 11/12/2012 0735   AST 28 11/22/2013 0951   AST 16 11/12/2012 0735   ALT 18 11/22/2013 0951   ALT 13 11/12/2012  0735   BILITOT 0.63 11/22/2013 0951   BILITOT 0.9 11/12/2012 0735      STUDIES:   Ct Chest W Contrast  11/21/2013   CLINICAL DATA:  Chest pain, history of breast cancer. Completed chemotherapy and radiation. Diagnosed 2006 and 2010.  EXAM: CT CHEST WITH CONTRAST  TECHNIQUE: Multidetector CT imaging of the chest was performed during intravenous contrast administration.  CONTRAST:  28m OMNIPAQUE IOHEXOL 300 MG/ML  SOLN  COMPARISON:  PET-CT 11/23/2012, most recent similar comparison chest CT 07/15/2010.  FINDINGS: The previously seen 4 mm right upper lobe pulmonary nodule now measures 2 mm and is therefore likely benign given its decrease in size since the prior exam more than 3 years ago. Compared to dissimilar prior exam, there has been increase in size of a presumed intrapulmonary lymph node measuring 3 mm in short axis image 18, in the right upper lobe. A possible intrapulmonary lymph node measuring 3 mm in short axis diameter versus a new pulmonary nodule is noted also in the right upper lobe image 17.  A new left lower lobe nodule measuring 0.9 cm image 44 is identified. Central airways are patent.  No new lytic or sclerotic osseous lesion is identified.  Bilateral submuscular breast implants are in place. No axillary or internal mammary artery chain lymphadenopathy. Heart size is normal. A new 1 cm pretracheal lymph node is identified image 22.  At incomplete imaging of the upper abdomen, there an ill-defined hypodense 1.8 cm at the dome of the right hepatic lobe image 42.  IMPRESSION: New ill-defined right hepatic lobe lesion, not well characterized on this exam but concerning for development of possible metastasis  in the setting of pretracheal lymphadenopathy, a new left lower lobe pulmonary nodule and enlarging right upper lobe pulmonary nodules or intramammary lymph nodes.  Dedicated CT abdomen/pelvis recommended for further evaluation. If findings do not demonstrate other conclusive evidence for intra-abdominal or pelvic metastatic disease, and the hepatic mass is not completely characterized at CT, then abdominal MRI may also be necessary for liver mass characterization.  Ultimately, PET-CT is recommended for assessment of these findings.  These results will be called to the ordering clinician or representative by the Radiologist Assistant, and communication documented in the PACS Dashboard.   Electronically Signed   By: GConchita ParisM.D.   On: 11/21/2013 15:08   Mr Liver W Wo Contrast  11/22/2013   CLINICAL DATA:  Breast cancer.  Possible liver metastasis.  EXAM: MRI ABDOMEN WITHOUT AND WITH CONTRAST  TECHNIQUE: Multiplanar, multisequence MR imaging was performed both before and after administration of intravenous contrast.  CONTRAST:  122mMULTIHANCE GADOBENATE DIMEGLUMINE 529 MG/ML IV SOLN  COMPARISON:  Chest CT 11/21/2013.  FINDINGS: There is an ill-defined right hepatic lobe region of the dome which measures approximately 19 x 15 mm. This demonstrates increased T2 signal intensity and contrast enhancement and is worrisome for a metastasis. Other peripheral wedge-shaped areas of enhancement are probably small vascular shunts. On the T2 weighted sequences there are 2 other small right hepatic lobe lesions posteriorly. These are best seen on series 3 image 24 and 25. These are very difficult to see on the post contrast images.  The spleen is normal in size. No focal lesions. The pancreas is normal. The gallbladder is normal. No common bile duct dilatation. The adrenal glands and kidneys are unremarkable. A small left renal cyst is noted.  The stomach, duodenum, visualized small bowel and visualized colon are  grossly normal. There are enlarged retroperitoneal  lymph nodes best demonstrated on the postcontrast images series at 1103 image 77 and image number 84.  IMPRESSION: 1. 19 x 15 mm right hepatic lobe metastatic lesion and 2 smaller right lobe lesions. 2. Retroperitoneal lymphadenopathy.   Electronically Signed   By: Kalman Jewels M.D.   On: 11/22/2013 14:28   Nm Pet Image Restag (ps) Skull Base To Thigh  12/02/2013   CLINICAL DATA:  Subsequent treatment strategy for breast carcinoma.  EXAM: NUCLEAR MEDICINE PET SKULL BASE TO THIGH  FASTING BLOOD GLUCOSE:  Value: 81m/dl  TECHNIQUE: 20.1 mCi F-18 FDG was injected intravenously. CT data was obtained and used for attenuation correction and anatomic localization only. (This was not acquired as a diagnostic CT examination.) Additional exam technical data entered on technologist worksheet.  COMPARISON:  PET-CT scan 11/23/2012  FINDINGS: NECK  Bilateral hypermetabolic infraclavicular and left supraclavicular lymph nodes. These lymph nodes are small but intensely hypermetabolic. For example 9 mm left infraclavicular lymph node with SUV max 9.3.  CHEST  Hypermetabolic adenopathy extends along the right paratracheal nodal station as well as prevascular lymph nodes. There is a left lower paratracheal hypermetabolic lymph node additionally. Example right lower paratracheal lymph node measures only 10 mm short axis of with intense metabolic activity (SUV max 8.0).  Left lower lobe pulmonary nodule just above the diaphragm (Image 104) has mild associated metabolic activity. Likewise 7 mm nodule in the left upper lobe (image 68) has mild metabolic activity.  ABDOMEN/PELVIS  Low-density 15 mm lesion the superior right hepatic lobe has intense metabolic activity with SUV max 6.4 (image 96).  There is intense hypermetabolic enlarged periportal lymph nodes with SUV max 10.6. Small lymph nodes anterior to the aorta measuring 12 and 14 mm (image 135 with intense metabolic activity  with SUV max 12.5.  SKELETON  No focal hypermetabolic activity to suggest skeletal metastasis.  IMPRESSION: 1. Breast cancer recurrence with hypermetabolic supraclavicular and mediastinal adenopathy. 2. Left lower lobe pulmonary nodule and right upper lobe pulmonary nodules with very mild activity are concerning for pulmonary parenchymal metastasis 3. New right hepatic lobe metastasis. 4. Periportal and retroperitoneal periaortic metastatic adenopathy.   Electronically Signed   By: SSuzy BouchardM.D.   On: 12/02/2013 16:47   [place]  12/11/2013   CLINICAL DATA:  48year old female with breast cancer. Recent PET-CT suggested recurrent metastatic disease. A tissue diagnosis has been requested.  EXAM: ULTRASOUND-GUIDED BIOPSY OF LEFT SUPRACLAVICULAR LYMPH NODES.  Physician: AStephan Minister Henn, MD  MEDICATIONS: Versed 100 mcg, Versed 2 mg. A radiology nurse monitored the patient for moderate sedation.  ANESTHESIA/SEDATION: Moderate sedation time: 15 min  FLUOROSCOPY TIME:  None  PROCEDURE: The patient's recent the cross-sectional imaging was evaluated. The liver and supraclavicular areas were evaluated with ultrasound. The liver lesion is located high at the dome and difficult to see. The left supraclavicular lymph nodes are most accessible. These findings were discussed with the patient. The procedure was explained to the patient. The risks and benefits of the procedure were discussed and the patient's questions were addressed. Informed consent was obtained from the patient. Left side of the neck was prepped and draped in sterile fashion. Skin was anesthetized with 1% lidocaine. A conglomeration of abnormal lymph nodes just posterior to left internal jugular vein were targeted. An 18 gauge needle was directed into these lymph nodes with ultrasound guidance and a core biopsy was obtained. Sample placed in saline. A total of 5 core biopsies were obtained. A small bandage was placed  over the skin puncture site.   COMPLICATIONS: None  FINDINGS: Conglomeration of hypoechoic lymph nodes posterior to the left internal jugular vein and anterolateral to the left common carotid artery. Core biopsy needle was identified within these abnormal lymph nodes. No significant bleeding following the procedure.  IMPRESSION: Ultrasound-guided core biopsies of left supraclavicular lymph nodes.   Electronically Signed   By: Markus Daft M.D.   On: 12/11/2013 14:15     ASSESSMENT: 48 y.o. [place] woman  (1)  status post left lumpectomy under the care of Dr. 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 2 of 23 lymph nodes were involved.  Margins were clear.  (2) status post adjuvant chemotherapy consisting of 6 q. three-week doses of docetaxel/doxorubicin/cyclophosphamide given between January 2007 and may of 2007, with last dose on 04/20/2006.  (3) status post radiation therapy to the left breast, completed 07/11/2006  (4) began on tamoxifen in early August 2007 and continued until October 2009. She status post hysterectomy with bilateral salpingo-oophorectomy on 07/15/2008, and was started on letrozole in October of 2009.  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 PET and CT of the chest showed no evidence of metastasis in January 2011.  (6) status post bilateral mastectomies 01/25/2010, with the right breast showing no evidence of malignancy; in the left breast showing a 1.0 cm grade 2 invasive ductal carcinoma with high-grade DCIS. Tumor was ER +22%, PR negative, HER-2/neu negative, with proliferation marker of 13%. Margins were clear. Patient underwent concurrent left latissimus flap reconstruction and right implant reconstruction.  (7)  status post additional chemotherapy with one cycle of  carboplatin/gemcitabine given on 03/11/2010. She then received 5 q. three-week cycles of IV CMF between 03/25/2010 and 06/17/2010.  (8) started on exemestane in January 2012 and continued until late November 2013 when she was hospitalized for apparent TIA. There was apparently some question of whether or not the antiestrogen therapy could have contributed to this development, and she has not resumed antiestrogen therapy since November 2013.  (9)  evidence of disease progression noted on chest CT, liver MRI and PET scan in December 2014, with suspicious lung nodules, lymphadenopathy, and 3 lesions in the right lobe of the liver, but no evidence of bony disease and no evidence of brain involvement by brain MRI September 2014. Biopsy of a left supraclavicular lymph node on 12/11/2013 confirmed metastatic carcinoma.  (10) fulvestrant started 12/03/2013   PLAN: Over half of our 35 minute appointment today was spent in addressing the patient's concerns, counseling her with regards to her recent diagnosis, reviewing her recent biopsy results, and coordinating care.   We again reviewed Judeen Hammans is recent scan results, including her chest CT, liver MRI, and PET scan. We also reviewed the results of her recent biopsy, and she understands that the prognostic panel is still pending.  In the meanwhile, she'll continue to receive fulvestrant, and will receive her next loading dose today. She'll return on January 20 for her last "loading dose", and from that point forward will receive the injections every 4 weeks.   She's also scheduled to meet with [name] when she returns on January 20. At that point, we should have the prognostic panel back from her biopsy, and they will be able to review those results in detail.  They'll also discuss when to  restage to assess response to this new therapy.  Savhanna voices her understanding and agreement with the plan as detailed above. She understands that the goal of  treatment is control of disease. She also knows to call with any changes or problems that occur prior to her next scheduled appointment on January 20.    Nachum Derossett, PA-C   12/18/2013 9:53 AM

## 2013-12-19 ENCOUNTER — Other Ambulatory Visit: Payer: Self-pay | Admitting: *Deleted

## 2013-12-19 ENCOUNTER — Other Ambulatory Visit: Payer: Self-pay | Admitting: Physician Assistant

## 2013-12-19 DIAGNOSIS — C787 Secondary malignant neoplasm of liver and intrahepatic bile duct: Principal | ICD-10-CM

## 2013-12-19 DIAGNOSIS — G4700 Insomnia, unspecified: Secondary | ICD-10-CM

## 2013-12-19 DIAGNOSIS — C50919 Malignant neoplasm of unspecified site of unspecified female breast: Secondary | ICD-10-CM

## 2013-12-19 MED ORDER — ZOLPIDEM TARTRATE 10 MG PO TABS: 5.0000 mg | ORAL_TABLET | Freq: Every day | ORAL | Status: DC

## 2013-12-19 NOTE — Progress Notes (Signed)
Kristin Hoffman contacted the office this morning complaining of continued insomnia. She tells me she was only able to sleep approximately 3 hours last night and has tried multiple sleep aids including Benadryl and lorazepam.  She has also been taking Ambien periodically, and has a prescription for 10 mg tablets last filled in August. She is aware of the possible risks associated with Ambien, including sleep walking. She does not live alone so her husband can "keep an eye on her".  I have refilled this today per her request, Ambien 10 mg, half to one tablet by mouth at bedtime as needed for insomnia, #20 with no refills.  Micah Flesher, PA-C 12/19/2013

## 2013-12-24 ENCOUNTER — Other Ambulatory Visit: Payer: Self-pay | Admitting: Physician Assistant

## 2013-12-24 ENCOUNTER — Encounter: Payer: Self-pay | Admitting: Physician Assistant

## 2013-12-24 DIAGNOSIS — C50312 Malignant neoplasm of lower-inner quadrant of left female breast: Secondary | ICD-10-CM

## 2013-12-24 DIAGNOSIS — C50919 Malignant neoplasm of unspecified site of unspecified female breast: Secondary | ICD-10-CM

## 2013-12-24 DIAGNOSIS — Z780 Asymptomatic menopausal state: Secondary | ICD-10-CM

## 2013-12-24 HISTORY — DX: Malignant neoplasm of unspecified site of unspecified female breast: C50.919

## 2013-12-30 ENCOUNTER — Other Ambulatory Visit: Payer: Self-pay | Admitting: Oncology

## 2013-12-31 ENCOUNTER — Ambulatory Visit (HOSPITAL_BASED_OUTPATIENT_CLINIC_OR_DEPARTMENT_OTHER): Payer: BC Managed Care – PPO

## 2013-12-31 ENCOUNTER — Ambulatory Visit (HOSPITAL_BASED_OUTPATIENT_CLINIC_OR_DEPARTMENT_OTHER): Payer: BC Managed Care – PPO | Admitting: Oncology

## 2013-12-31 ENCOUNTER — Other Ambulatory Visit (HOSPITAL_BASED_OUTPATIENT_CLINIC_OR_DEPARTMENT_OTHER): Payer: BC Managed Care – PPO

## 2013-12-31 VITALS — BP 155/86 | HR 84 | Temp 98.0°F | Resp 20 | Ht 64.0 in | Wt 190.4 lb

## 2013-12-31 DIAGNOSIS — K7689 Other specified diseases of liver: Secondary | ICD-10-CM

## 2013-12-31 DIAGNOSIS — C50919 Malignant neoplasm of unspecified site of unspecified female breast: Secondary | ICD-10-CM

## 2013-12-31 DIAGNOSIS — R911 Solitary pulmonary nodule: Secondary | ICD-10-CM

## 2013-12-31 DIAGNOSIS — Z780 Asymptomatic menopausal state: Secondary | ICD-10-CM

## 2013-12-31 DIAGNOSIS — C50319 Malignant neoplasm of lower-inner quadrant of unspecified female breast: Secondary | ICD-10-CM

## 2013-12-31 DIAGNOSIS — Z8673 Personal history of transient ischemic attack (TIA), and cerebral infarction without residual deficits: Secondary | ICD-10-CM

## 2013-12-31 DIAGNOSIS — C50312 Malignant neoplasm of lower-inner quadrant of left female breast: Secondary | ICD-10-CM

## 2013-12-31 DIAGNOSIS — Z5111 Encounter for antineoplastic chemotherapy: Secondary | ICD-10-CM

## 2013-12-31 DIAGNOSIS — C770 Secondary and unspecified malignant neoplasm of lymph nodes of head, face and neck: Secondary | ICD-10-CM

## 2013-12-31 DIAGNOSIS — Z170 Estrogen receptor positive status [ER+]: Secondary | ICD-10-CM

## 2013-12-31 DIAGNOSIS — R599 Enlarged lymph nodes, unspecified: Secondary | ICD-10-CM

## 2013-12-31 LAB — CBC WITH DIFFERENTIAL/PLATELET
BASO%: 0.8 % (ref 0.0–2.0)
BASOS ABS: 0 10*3/uL (ref 0.0–0.1)
EOS ABS: 0.1 10*3/uL (ref 0.0–0.5)
EOS%: 2 % (ref 0.0–7.0)
HEMATOCRIT: 40.5 % (ref 34.8–46.6)
HEMOGLOBIN: 13.3 g/dL (ref 11.6–15.9)
LYMPH#: 1.4 10*3/uL (ref 0.9–3.3)
LYMPH%: 23.2 % (ref 14.0–49.7)
MCH: 30.1 pg (ref 25.1–34.0)
MCHC: 32.8 g/dL (ref 31.5–36.0)
MCV: 91.8 fL (ref 79.5–101.0)
MONO#: 0.4 10*3/uL (ref 0.1–0.9)
MONO%: 6.7 % (ref 0.0–14.0)
NEUT%: 67.3 % (ref 38.4–76.8)
NEUTROS ABS: 4.2 10*3/uL (ref 1.5–6.5)
Platelets: 183 10*3/uL (ref 145–400)
RBC: 4.41 10*6/uL (ref 3.70–5.45)
RDW: 13.3 % (ref 11.2–14.5)
WBC: 6.2 10*3/uL (ref 3.9–10.3)

## 2013-12-31 LAB — COMPREHENSIVE METABOLIC PANEL (CC13)
ALT: 18 U/L (ref 0–55)
ANION GAP: 10 meq/L (ref 3–11)
AST: 23 U/L (ref 5–34)
Albumin: 4 g/dL (ref 3.5–5.0)
Alkaline Phosphatase: 64 U/L (ref 40–150)
BUN: 15 mg/dL (ref 7.0–26.0)
CO2: 24 meq/L (ref 22–29)
CREATININE: 0.8 mg/dL (ref 0.6–1.1)
Calcium: 9.1 mg/dL (ref 8.4–10.4)
Chloride: 106 mEq/L (ref 98–109)
Glucose: 90 mg/dl (ref 70–140)
POTASSIUM: 3.7 meq/L (ref 3.5–5.1)
Sodium: 140 mEq/L (ref 136–145)
Total Bilirubin: 0.4 mg/dL (ref 0.20–1.20)
Total Protein: 7 g/dL (ref 6.4–8.3)

## 2013-12-31 MED ORDER — TRAZODONE HCL 50 MG PO TABS: 50.0000 mg | ORAL_TABLET | Freq: Every day | ORAL | Status: DC

## 2013-12-31 MED ORDER — FULVESTRANT 250 MG/5ML IM SOLN
500.0000 mg | Freq: Once | INTRAMUSCULAR | Status: AC
  Administered 2013-12-31: 500 mg via INTRAMUSCULAR
  Filled 2013-12-31: qty 10

## 2013-12-31 NOTE — Progress Notes (Signed)
ID: [name] OB: 10/10/1966  MR#: [id number]  KWI#:[id number]  PCP: Drake Leach, MD GYN:  [name] SU: [name] OTHER MD: [name]  CHIEF COMPLAINT:  Right Breast Cancer x 2 occurrences, now with metastatic disease as of December 2014   HISTORY OF PRESENT ILLNESS: This patient was previously followed by [name], and is transferred to [name] service as of 08/20/2013.  At the age of 3, the patient had a screening mammogram as a baseline. She had recently given birth and was on birth control pills at that time. The mammogram in November 2006 showed an area of architectural distortion in the left lower inner quadrant. Subsequently an ultrasound was obtained of the left breast showing a 2.0 x 1.5 x 1.4 cm mass, at the 8:00 position, 4 cm from the nipple. A core biopsy performed on 10/21/2005 showed an invasive mammary carcinoma, ER +70%, PR +41%, HER-2/neu negative, with proliferation marker of 38%. [phone])  Breast MRI on 11/08/2005 confirmed a 2.5 cm spiculated mass in the left lower inner quadrant with 3 small satellite nodules adjacent to the primary mass: 3.5 cm posterior medial to the primary mass, measuring 9 mm; 1.5 cm anterior medial to the primary mass measuring 5 mm; and 2 cm medial to the primary mass measuring 5 mm. No axillary adenopathy was noted. No suspicious masses or enhancement are noted in the right breast.  Willette underwentt left lumpectomy under the care of Dr. 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 2 of 23 lymph nodes were involved.  Margins were clear.  She received adjuvant chemotherapy consisting of 6 q. three-week doses of docetaxel/doxorubicin/cyclophosphamide given between January 2007 and may of 2007, with last dose on 04/20/2006. She underwent radiation therapy completed 07/11/2006, after which she began on tamoxifen in early  August 2007.  She underwent hysterectomy with bilateral salpingo-oophorectomy on 07/15/2008, and was started on letrozole in October of 2009.  A mammogram 11/10/2009 showed microcalcifications in the left breast, and a subsequent biopsy on 11/18/2009 confirmed invasive mammary carcinoma in the left breast. PET and CT of the chest showed no evidence of metastasis in January 2011.  She underwent bilateral mastectomies 01/25/2010, with the right breast showing no evidence of malignancy; in the left breast showing a 1.0 cm grade 2 invasive ductal carcinoma with high-grade DCIS. Tumor was ER +22%, PR negative, HER-2/neu negative, with proliferation marker of 13%. Margins were clear. Patient underwent concurrent left latissimus flap reconstruction and right implant reconstruction.  She received additional chemotherapy with one cycle of carboplatin/gemcitabine given on 03/11/2010. She then received 5 q. three-week cycles of IV CMF between 03/25/2010 and 06/17/2010.  She was started on exemestane in January 2012 and continued until late November 2013 when she was hospitalized for apparent TIA.   Her subsequent history is as detailed below   INTERVAL HISTORY: Shylyn returns today for followup of her metastatic breast cancer. She has started on fulvestrant, and is tolerating it moderately well. She did well with the first shot but this echo" heart a lot". There was no boost.  REVIEW OF SYSTEMS: Azari generally feels well. She is having a lot of trouble falling asleep. When she does fall asleep she doesn't wake up. She describes herself as mildly fatigued. Otherwise a detailed review of systems today was noncontributory   PAST MEDICAL HISTORY: Past Medical History  Diagnosis Date  . Breast cancer   .  Depression   . Reflux   . Cancer   . Hx-TIA (transient ischemic attack) 11/22/2013  . Chronic headaches 11/22/2013  . Anxiety 11/22/2013  . Breast cancer metastasized to multiple sites 12/24/2013    PAST  SURGICAL HISTORY: Past Surgical History  Procedure Laterality Date  . Mastectomy w/ nodes partial    . Mastectomy    . Breast reconstruction    . Portacath placement      x 2  . Portacath removal      x 2  . Abdominal hysterectomy    . Tee without cardioversion  11/12/2012    Procedure: TRANSESOPHAGEAL ECHOCARDIOGRAM (TEE);  Surgeon: Candee Furbish, MD;  Location: [facility] ENDOSCOPY;  Service: Cardiovascular;  Laterality: N/A;  This TEE may be Dr. Marlou Porch or a LHC Dr    FAMILY HISTORY Both parents are alive and well. Patient has one sister who is 42 years younger and is in good health. No other history of breast or ovarian cancer in the family. No family history on file.  GYNECOLOGIC HISTORY:   (Updated January 2015) G2P2, menarche at age 55 with irregular menses. On birth control pills in the past. On Clomid to induce ovulation with first pregnancy. Also had preeclampsia with first pregnancy. Status post hysterectomy and bilateral salpingo-oophorectomy in August 2009.  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 She is originally from [place]. She's been married to [place], for 13 years. He works as an Pharmacist, hospital at [employer].   ADVANCED DIRECTIVES:    HEALTH MAINTENANCE:  (Updated 11/22/2013) History  Substance Use Topics  . Smoking status: Never Smoker   . Smokeless tobacco: Never Used  . Alcohol Use: Yes     Comment: rarely     Colonoscopy:  Never  PAP: S/P LAVH/BSO in August 2009  Bone density: Never  Lipid panel: Not on file   No Known Allergies  Current Outpatient Prescriptions  Medication Sig Dispense Refill  . aspirin 81 MG tablet Take 81 mg by mouth daily.      . cetirizine (ZYRTEC) 10 MG tablet Take 10 mg by mouth daily.        . cholecalciferol (VITAMIN D) 400 UNITS TABS tablet Take 1,000 Units by mouth daily.      Marland Kitchen escitalopram (LEXAPRO) 20 MG tablet Take 20 mg by  mouth daily with breakfast.       . LORazepam (ATIVAN) 0.5 MG tablet Take 1 mg by mouth every 4 (four) hours as needed for anxiety.      . Multiple Vitamin (MULTIVITAMIN) tablet Take 1 tablet by mouth daily.      Marland Kitchen omeprazole (PRILOSEC) 40 MG capsule Take 40 mg by mouth daily.       Marland Kitchen zolpidem (AMBIEN) 10 MG tablet Take 0.5-1 tablets (5-10 mg total) by mouth at bedtime. As needed for insomnia  20 tablet  0   No current facility-administered medications for this visit.    OBJECTIVE: Young white woman who appears well Filed Vitals:   12/31/13 1624  BP: 155/86  Pulse: 84  Temp: 98 F (36.7 C)  Resp: 20  Body mass index is 32.67 kg/(m^2).  ECOG: 0 Filed Weights   12/31/13 1624  Weight: 190 lb 6.4 oz (86.365 kg)   Sclerae unicteric, pupils equal and reactive Oropharynx clear and moist-- no thrush I do not palpate measurable cervical or supraclavicular adenopathy Lungs no rales or  rhonchi Heart regular rate and rhythm Abd soft, nontender, positive bowel sounds MSK no focal spinal tenderness, no upper extremity lymphedema Neuro: nonfocal, well oriented, appropriate affect Breasts: Status post bilateral mastectomies. No evidence of local recurrence.  LAB RESULTS:  Lab Results  Component Value Date   WBC 6.2 12/31/2013   NEUTROABS 4.2 12/31/2013   HGB 13.3 12/31/2013   HCT 40.5 12/31/2013   MCV 91.8 12/31/2013   PLT 183 12/31/2013      Chemistry      Component Value Date/Time   NA 140 11/22/2013 0951   NA 142 11/12/2012 0735   K 4.2 11/22/2013 0951   K 3.8 11/12/2012 0735   CL 104 11/14/2012 1407   CL 104 11/12/2012 0735   CO2 26 11/22/2013 0951   CO2 30 11/12/2012 0735   BUN 14.1 11/22/2013 0951   BUN 13 11/12/2012 0735   CREATININE 0.9 11/22/2013 0951   CREATININE 0.97 11/12/2012 0735      Component Value Date/Time   CALCIUM 9.6 11/22/2013 0951   CALCIUM 9.1 11/12/2012 0735   ALKPHOS 68 11/22/2013 0951   ALKPHOS 69 11/12/2012 0735   AST 28 11/22/2013 0951   AST 16  11/12/2012 0735   ALT 18 11/22/2013 0951   ALT 13 11/12/2012 0735   BILITOT 0.63 11/22/2013 0951   BILITOT 0.9 11/12/2012 0735      STUDIES: Patient: STACYE, NOORI Collected: 12/11/2013 Client: [facility] Accession: [id number] Received: 12/11/2013 Markus Daft DOB: 10/28/1966 Age: 31 Gender: F Reported: 12/13/2013 [address] Patient Ph: [phone] MRN #: [id number] [place], [place] [address] Visit #: [id number] Chart #: Phone: [phone] Fax: CC: Lurline Del, MD REPORT OF SURGICAL PATHOLOGY ADDITIONAL INFORMATION: PROGNOSTIC INDICATORS - ACIS Results: IMMUNOHISTOCHEMICAL AND MORPHOMETRIC ANALYSIS BY THE AUTOMATED CELLULAR IMAGING SYSTEM (ACIS) Estrogen Receptor: 45%, POSITIVE, MODERATE STAINING INTENSITY Progesterone Receptor: 17%, POSITIVE, MODERATE STAINING INTENSITY REFERENCE RANGE ESTROGEN RECEPTOR NEGATIVE <1% POSITIVE =>1% PROGESTERONE RECEPTOR NEGATIVE <1% POSITIVE =>1% All controls stained appropriately Enid Cutter MD Pathologist, Electronic Signature ( Signed 12/19/2013) CHROMOGENIC IN-SITU HYBRIDIZATION Results: HER-2/NEU BY CISH - NO AMPLIFICATION OF HER-2 DETECTED. RESULT RATIO OF HER2: CEP 17 SIGNALS 0.96 AVERAGE HER2 COPY NUMBER PER CELL 1.25 REFERENCE RANGE 1 of 2 FINAL for SARALYNN, LANGHORST [phone]) ADDITIONAL INFORMATION:(continued) NEGATIVE HER2/Chr17 Ratio <2.0 and Average HER2 copy number <4.0 EQUIVOCAL HER2/Chr17 Ratio <2.0 and Average HER2 copy number 4.0 and <6.0 POSITIVE HER2/Chr17 Ratio >=2.0 and/or Average HER2 copy number >=6.0 Enid Cutter MD   ASSESSMENT: 48 y.o. [place] woman  (1)  status post left lumpectomy under the care of Dr. 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 2 of 23 lymph nodes were involved.  Margins were clear.  (2) status post adjuvant chemotherapy consisting of 6 q. three-week doses of  docetaxel/doxorubicin/cyclophosphamide given between January 2007 and may of 2007, with last dose on 04/20/2006.  (3) status post radiation therapy to the left breast, completed 07/11/2006  (4) began on tamoxifen in early August 2007 and continued until October 2009. She status post hysterectomy with bilateral salpingo-oophorectomy on 07/15/2008, and was started on letrozole in October of 2009.  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 PET and CT of the chest showed no evidence of metastasis in January 2011.  (6) status post bilateral mastectomies 01/25/2010, with the right breast  showing no evidence of malignancy; in the left breast showing a 1.0 cm grade 2 invasive ductal carcinoma with high-grade DCIS. Tumor was ER +22%, PR negative, HER-2/neu negative, with proliferation marker of 13%. Margins were clear. Patient underwent concurrent left latissimus flap reconstruction and right implant reconstruction.  (7)  status post additional chemotherapy with one cycle of carboplatin/gemcitabine given on 03/11/2010. She then received 5 q. three-week cycles of IV CMF between 03/25/2010 and 06/17/2010.  (8) started on exemestane in January 2012 and continued until late November 2013 when she was hospitalized for apparent TIA at which time her exemestane was stopped and not resumed  (9) METASTATIC DISEASE: evidence of disease recurence noted on chest CT, liver MRI and PET scan in December 2014, with suspicious lung nodules, lymphadenopathy, and 3 lesions in the right lobe of the liver, but no evidence of bony disease and no evidence of brain involvement by brain MRI September 2014. Biopsy of a left supraclavicular lymph node on 12/11/2013 confirmed metastatic carcinoma, estrogen receptor 45% positive, progesterone receptor 17% positive, with no HER-2 amplification.  (10) fulvestrant  started 12/03/2013   PLAN: Makynleigh is tolerating the fulvestrant is generally well. Sometimes the injection may hit I nerve and perhaps that is the reason she had that more persistent pain with a second dose. Hopefully that will not recur or at least will be infrequent.  I have set her up for a repeat abdominal MRI in mid April, to assess for response. She will return to see me shortly after that test. In the meantime she will continue to receive fulvestrant on a monthly basis. She knows to call for any problems that may develop before her next visit here   Chauncey Cruel, MD   12/31/2013 4:38 PM

## 2013-12-31 NOTE — Patient Instructions (Signed)
Fulvestrant injection What is this medicine? FULVESTRANT (ful VES trant) blocks the effects of estrogen. It is used to treat breast cancer in women past the age of menopause. This medicine may be used for other purposes; ask your health care provider or pharmacist if you have questions. COMMON BRAND NAME(S): FASLODEX What should I tell my health care provider before I take this medicine? They need to know if you have any of these conditions: -bleeding problems -liver disease -low levels of platelets in the blood -an unusual or allergic reaction to fulvestrant, other medicines, foods, dyes, or preservatives -pregnant or trying to get pregnant -breast-feeding How should I use this medicine? This medicine is for injection into a muscle. It is usually given by a health care professional in a hospital or clinic setting. Talk to your pediatrician regarding the use of this medicine in children. Special care may be needed. Overdosage: If you think you have taken too much of this medicine contact a poison control center or emergency room at once. NOTE: This medicine is only for you. Do not share this medicine with others. What if I miss a dose? It is important not to miss your dose. Call your doctor or health care professional if you are unable to keep an appointment. What may interact with this medicine? -medicines that treat or prevent blood clots like warfarin, enoxaparin, and dalteparin This list may not describe all possible interactions. Give your health care provider a list of all the medicines, herbs, non-prescription drugs, or dietary supplements you use. Also tell them if you smoke, drink alcohol, or use illegal drugs. Some items may interact with your medicine. What should I watch for while using this medicine? Your condition will be monitored carefully while you are receiving this medicine. You will need important blood work done while you are taking this medicine. Do not become pregnant  while taking this medicine. Women should inform their doctor if they wish to become pregnant or think they might be pregnant. There is a potential for serious side effects to an unborn child. Talk to your health care professional or pharmacist for more information. What side effects may I notice from receiving this medicine? Side effects that you should report to your doctor or health care professional as soon as possible: -allergic reactions like skin rash, itching or hives, swelling of the face, lips, or tongue -feeling faint or lightheaded, falls -fever or flu-like symptoms -sore throat -vaginal bleeding Side effects that usually do not require medical attention (report to your doctor or health care professional if they continue or are bothersome): -aches, pains -constipation or diarrhea -headache -hot flashes -nausea, vomiting -pain at site where injected -stomach pain This list may not describe all possible side effects. Call your doctor for medical advice about side effects. You may report side effects to FDA at 1-800-FDA-1088. Where should I keep my medicine? This drug is given in a hospital or clinic and will not be stored at home. NOTE: This sheet is a summary. It may not cover all possible information. If you have questions about this medicine, talk to your doctor, pharmacist, or health care provider.  2014, Elsevier/Gold Standard. (2008-04-07 15:39:24)  

## 2014-01-01 ENCOUNTER — Telehealth: Payer: Self-pay | Admitting: Oncology

## 2014-01-01 LAB — VITAMIN D 25 HYDROXY (VIT D DEFICIENCY, FRACTURES): Vit D, 25-Hydroxy: 54 ng/mL (ref 30–89)

## 2014-01-01 NOTE — Telephone Encounter (Signed)
added appts for 4/14 and lb w/inj appt 2/17 and 3/17. lmonvm for pt re appts and mailed schedule.

## 2014-01-01 NOTE — Telephone Encounter (Signed)
add to previous note. central will contact pt w/mri appt.

## 2014-01-28 ENCOUNTER — Other Ambulatory Visit: Payer: BC Managed Care – PPO

## 2014-01-28 ENCOUNTER — Other Ambulatory Visit: Payer: Self-pay | Admitting: *Deleted

## 2014-01-28 ENCOUNTER — Telehealth: Payer: Self-pay | Admitting: Oncology

## 2014-01-28 ENCOUNTER — Ambulatory Visit: Payer: BC Managed Care – PPO

## 2014-01-28 DIAGNOSIS — C50919 Malignant neoplasm of unspecified site of unspecified female breast: Secondary | ICD-10-CM

## 2014-01-28 DIAGNOSIS — C50312 Malignant neoplasm of lower-inner quadrant of left female breast: Secondary | ICD-10-CM

## 2014-01-31 ENCOUNTER — Ambulatory Visit (HOSPITAL_BASED_OUTPATIENT_CLINIC_OR_DEPARTMENT_OTHER): Payer: BC Managed Care – PPO

## 2014-01-31 VITALS — BP 144/79 | HR 69 | Temp 98.8°F

## 2014-01-31 DIAGNOSIS — Z5111 Encounter for antineoplastic chemotherapy: Secondary | ICD-10-CM

## 2014-01-31 DIAGNOSIS — C50319 Malignant neoplasm of lower-inner quadrant of unspecified female breast: Secondary | ICD-10-CM

## 2014-01-31 DIAGNOSIS — C50919 Malignant neoplasm of unspecified site of unspecified female breast: Secondary | ICD-10-CM

## 2014-01-31 DIAGNOSIS — C50312 Malignant neoplasm of lower-inner quadrant of left female breast: Secondary | ICD-10-CM

## 2014-01-31 LAB — CBC WITH DIFFERENTIAL/PLATELET
BASO%: 0.6 % (ref 0.0–2.0)
BASOS ABS: 0 10*3/uL (ref 0.0–0.1)
EOS%: 1.7 % (ref 0.0–7.0)
Eosinophils Absolute: 0.1 10*3/uL (ref 0.0–0.5)
HCT: 40.6 % (ref 34.8–46.6)
HEMOGLOBIN: 13.4 g/dL (ref 11.6–15.9)
LYMPH#: 1.5 10*3/uL (ref 0.9–3.3)
LYMPH%: 28.1 % (ref 14.0–49.7)
MCH: 30 pg (ref 25.1–34.0)
MCHC: 32.9 g/dL (ref 31.5–36.0)
MCV: 91 fL (ref 79.5–101.0)
MONO#: 0.3 10*3/uL (ref 0.1–0.9)
MONO%: 6.5 % (ref 0.0–14.0)
NEUT%: 63.1 % (ref 38.4–76.8)
NEUTROS ABS: 3.3 10*3/uL (ref 1.5–6.5)
Platelets: 200 10*3/uL (ref 145–400)
RBC: 4.46 10*6/uL (ref 3.70–5.45)
RDW: 13.3 % (ref 11.2–14.5)
WBC: 5.3 10*3/uL (ref 3.9–10.3)

## 2014-01-31 LAB — COMPREHENSIVE METABOLIC PANEL (CC13)
ALBUMIN: 4.2 g/dL (ref 3.5–5.0)
ALK PHOS: 72 U/L (ref 40–150)
ALT: 15 U/L (ref 0–55)
AST: 26 U/L (ref 5–34)
Anion Gap: 10 mEq/L (ref 3–11)
BUN: 16.7 mg/dL (ref 7.0–26.0)
CALCIUM: 9.8 mg/dL (ref 8.4–10.4)
CHLORIDE: 104 meq/L (ref 98–109)
CO2: 27 mEq/L (ref 22–29)
Creatinine: 0.9 mg/dL (ref 0.6–1.1)
Glucose: 81 mg/dl (ref 70–140)
Potassium: 3.8 mEq/L (ref 3.5–5.1)
SODIUM: 141 meq/L (ref 136–145)
TOTAL PROTEIN: 7.3 g/dL (ref 6.4–8.3)
Total Bilirubin: 0.55 mg/dL (ref 0.20–1.20)

## 2014-01-31 MED ORDER — FULVESTRANT 250 MG/5ML IM SOLN
500.0000 mg | Freq: Once | INTRAMUSCULAR | Status: AC
  Administered 2014-01-31: 500 mg via INTRAMUSCULAR
  Filled 2014-01-31: qty 10

## 2014-02-24 ENCOUNTER — Other Ambulatory Visit: Payer: Self-pay | Admitting: *Deleted

## 2014-02-24 DIAGNOSIS — C50312 Malignant neoplasm of lower-inner quadrant of left female breast: Secondary | ICD-10-CM

## 2014-02-24 DIAGNOSIS — C50919 Malignant neoplasm of unspecified site of unspecified female breast: Secondary | ICD-10-CM

## 2014-02-25 ENCOUNTER — Other Ambulatory Visit (HOSPITAL_BASED_OUTPATIENT_CLINIC_OR_DEPARTMENT_OTHER): Payer: BC Managed Care – PPO

## 2014-02-25 ENCOUNTER — Ambulatory Visit (HOSPITAL_BASED_OUTPATIENT_CLINIC_OR_DEPARTMENT_OTHER): Payer: BC Managed Care – PPO

## 2014-02-25 VITALS — BP 146/90 | HR 79 | Temp 98.1°F

## 2014-02-25 DIAGNOSIS — C50312 Malignant neoplasm of lower-inner quadrant of left female breast: Secondary | ICD-10-CM

## 2014-02-25 DIAGNOSIS — C50919 Malignant neoplasm of unspecified site of unspecified female breast: Secondary | ICD-10-CM

## 2014-02-25 DIAGNOSIS — C770 Secondary and unspecified malignant neoplasm of lymph nodes of head, face and neck: Secondary | ICD-10-CM

## 2014-02-25 DIAGNOSIS — Z5111 Encounter for antineoplastic chemotherapy: Secondary | ICD-10-CM

## 2014-02-25 DIAGNOSIS — C50319 Malignant neoplasm of lower-inner quadrant of unspecified female breast: Secondary | ICD-10-CM

## 2014-02-25 LAB — CBC WITH DIFFERENTIAL/PLATELET
BASO%: 0.4 % (ref 0.0–2.0)
Basophils Absolute: 0 10*3/uL (ref 0.0–0.1)
EOS%: 1 % (ref 0.0–7.0)
Eosinophils Absolute: 0.1 10*3/uL (ref 0.0–0.5)
HCT: 41.1 % (ref 34.8–46.6)
HGB: 13.4 g/dL (ref 11.6–15.9)
LYMPH%: 22.3 % (ref 14.0–49.7)
MCH: 29.4 pg (ref 25.1–34.0)
MCHC: 32.6 g/dL (ref 31.5–36.0)
MCV: 90.2 fL (ref 79.5–101.0)
MONO#: 0.4 10*3/uL (ref 0.1–0.9)
MONO%: 6 % (ref 0.0–14.0)
NEUT#: 4.2 10*3/uL (ref 1.5–6.5)
NEUT%: 70.3 % (ref 38.4–76.8)
Platelets: 212 10*3/uL (ref 145–400)
RBC: 4.55 10*6/uL (ref 3.70–5.45)
RDW: 13.2 % (ref 11.2–14.5)
WBC: 5.9 10*3/uL (ref 3.9–10.3)
lymph#: 1.3 10*3/uL (ref 0.9–3.3)

## 2014-02-25 LAB — COMPREHENSIVE METABOLIC PANEL (CC13)
ALT: 19 U/L (ref 0–55)
AST: 25 U/L (ref 5–34)
Albumin: 4.1 g/dL (ref 3.5–5.0)
Alkaline Phosphatase: 69 U/L (ref 40–150)
Anion Gap: 13 mEq/L — ABNORMAL HIGH (ref 3–11)
BUN: 13 mg/dL (ref 7.0–26.0)
CALCIUM: 9.6 mg/dL (ref 8.4–10.4)
CHLORIDE: 103 meq/L (ref 98–109)
CO2: 26 mEq/L (ref 22–29)
CREATININE: 0.9 mg/dL (ref 0.6–1.1)
Glucose: 96 mg/dl (ref 70–140)
Potassium: 3.9 mEq/L (ref 3.5–5.1)
SODIUM: 142 meq/L (ref 136–145)
TOTAL PROTEIN: 7.3 g/dL (ref 6.4–8.3)
Total Bilirubin: 0.64 mg/dL (ref 0.20–1.20)

## 2014-02-25 MED ORDER — FULVESTRANT 250 MG/5ML IM SOLN
500.0000 mg | Freq: Once | INTRAMUSCULAR | Status: AC
  Administered 2014-02-25: 500 mg via INTRAMUSCULAR
  Filled 2014-02-25: qty 10

## 2014-03-20 ENCOUNTER — Other Ambulatory Visit: Payer: Self-pay | Admitting: Oncology

## 2014-03-20 ENCOUNTER — Ambulatory Visit (HOSPITAL_COMMUNITY)
Admission: RE | Admit: 2014-03-20 | Discharge: 2014-03-20 | Disposition: A | Discharge status: Home / Self Care | Payer: BC Managed Care – PPO | Source: Ambulatory Visit | Attending: Oncology | Admitting: Oncology

## 2014-03-20 DIAGNOSIS — Z8673 Personal history of transient ischemic attack (TIA), and cerebral infarction without residual deficits: Secondary | ICD-10-CM

## 2014-03-20 DIAGNOSIS — C787 Secondary malignant neoplasm of liver and intrahepatic bile duct: Secondary | ICD-10-CM | POA: Insufficient documentation

## 2014-03-20 DIAGNOSIS — C50919 Malignant neoplasm of unspecified site of unspecified female breast: Secondary | ICD-10-CM

## 2014-03-20 DIAGNOSIS — C50312 Malignant neoplasm of lower-inner quadrant of left female breast: Secondary | ICD-10-CM

## 2014-03-20 MED ORDER — GADOBENATE DIMEGLUMINE 529 MG/ML IV SOLN
19.0000 mL | Freq: Once | INTRAVENOUS | Status: AC | PRN
  Administered 2014-03-20: 19 mL via INTRAVENOUS

## 2014-03-21 ENCOUNTER — Other Ambulatory Visit: Payer: Self-pay | Admitting: Oncology

## 2014-03-24 ENCOUNTER — Other Ambulatory Visit: Payer: Self-pay | Admitting: *Deleted

## 2014-03-24 DIAGNOSIS — C50919 Malignant neoplasm of unspecified site of unspecified female breast: Secondary | ICD-10-CM

## 2014-03-24 DIAGNOSIS — C50312 Malignant neoplasm of lower-inner quadrant of left female breast: Secondary | ICD-10-CM

## 2014-03-25 ENCOUNTER — Ambulatory Visit (HOSPITAL_BASED_OUTPATIENT_CLINIC_OR_DEPARTMENT_OTHER): Payer: BC Managed Care – PPO | Admitting: Oncology

## 2014-03-25 ENCOUNTER — Ambulatory Visit (HOSPITAL_BASED_OUTPATIENT_CLINIC_OR_DEPARTMENT_OTHER): Payer: BC Managed Care – PPO

## 2014-03-25 ENCOUNTER — Other Ambulatory Visit (HOSPITAL_BASED_OUTPATIENT_CLINIC_OR_DEPARTMENT_OTHER): Payer: BC Managed Care – PPO

## 2014-03-25 VITALS — BP 147/91 | HR 93 | Temp 97.2°F | Resp 18 | Ht 64.0 in | Wt 194.0 lb

## 2014-03-25 VITALS — BP 147/91 | HR 93 | Temp 97.2°F

## 2014-03-25 DIAGNOSIS — C50319 Malignant neoplasm of lower-inner quadrant of unspecified female breast: Secondary | ICD-10-CM

## 2014-03-25 DIAGNOSIS — C770 Secondary and unspecified malignant neoplasm of lymph nodes of head, face and neck: Secondary | ICD-10-CM

## 2014-03-25 DIAGNOSIS — C50312 Malignant neoplasm of lower-inner quadrant of left female breast: Secondary | ICD-10-CM

## 2014-03-25 DIAGNOSIS — Z5111 Encounter for antineoplastic chemotherapy: Secondary | ICD-10-CM

## 2014-03-25 DIAGNOSIS — C50919 Malignant neoplasm of unspecified site of unspecified female breast: Secondary | ICD-10-CM

## 2014-03-25 DIAGNOSIS — C800 Disseminated malignant neoplasm, unspecified: Secondary | ICD-10-CM

## 2014-03-25 DIAGNOSIS — Z170 Estrogen receptor positive status [ER+]: Secondary | ICD-10-CM

## 2014-03-25 DIAGNOSIS — Z8673 Personal history of transient ischemic attack (TIA), and cerebral infarction without residual deficits: Secondary | ICD-10-CM

## 2014-03-25 LAB — COMPREHENSIVE METABOLIC PANEL (CC13)
ALT: 16 U/L (ref 0–55)
AST: 26 U/L (ref 5–34)
Albumin: 4.2 g/dL (ref 3.5–5.0)
Alkaline Phosphatase: 73 U/L (ref 40–150)
Anion Gap: 10 mEq/L (ref 3–11)
BILIRUBIN TOTAL: 0.55 mg/dL (ref 0.20–1.20)
BUN: 11 mg/dL (ref 7.0–26.0)
CALCIUM: 9.7 mg/dL (ref 8.4–10.4)
CO2: 27 meq/L (ref 22–29)
Chloride: 105 mEq/L (ref 98–109)
Creatinine: 0.9 mg/dL (ref 0.6–1.1)
GLUCOSE: 89 mg/dL (ref 70–140)
Potassium: 4.1 mEq/L (ref 3.5–5.1)
SODIUM: 142 meq/L (ref 136–145)
TOTAL PROTEIN: 7.4 g/dL (ref 6.4–8.3)

## 2014-03-25 LAB — CBC WITH DIFFERENTIAL/PLATELET
BASO%: 0.5 % (ref 0.0–2.0)
BASOS ABS: 0 10*3/uL (ref 0.0–0.1)
EOS%: 1.2 % (ref 0.0–7.0)
Eosinophils Absolute: 0.1 10*3/uL (ref 0.0–0.5)
HCT: 42 % (ref 34.8–46.6)
HEMOGLOBIN: 13.5 g/dL (ref 11.6–15.9)
LYMPH%: 27.6 % (ref 14.0–49.7)
MCH: 29.4 pg (ref 25.1–34.0)
MCHC: 32.3 g/dL (ref 31.5–36.0)
MCV: 91.1 fL (ref 79.5–101.0)
MONO#: 0.4 10*3/uL (ref 0.1–0.9)
MONO%: 6.9 % (ref 0.0–14.0)
NEUT#: 3.4 10*3/uL (ref 1.5–6.5)
NEUT%: 63.8 % (ref 38.4–76.8)
Platelets: 209 10*3/uL (ref 145–400)
RBC: 4.61 10*6/uL (ref 3.70–5.45)
RDW: 13.3 % (ref 11.2–14.5)
WBC: 5.4 10*3/uL (ref 3.9–10.3)
lymph#: 1.5 10*3/uL (ref 0.9–3.3)

## 2014-03-25 MED ORDER — FULVESTRANT 250 MG/5ML IM SOLN
500.0000 mg | Freq: Once | INTRAMUSCULAR | Status: AC
  Administered 2014-03-25: 500 mg via INTRAMUSCULAR
  Filled 2014-03-25: qty 10

## 2014-03-25 MED ORDER — EVEROLIMUS 10 MG PO TABS: 10.0000 mg | ORAL_TABLET | Freq: Every day | ORAL | Status: DC

## 2014-03-25 NOTE — Progress Notes (Signed)
ID: Kristin Hoffman OB: 25-Mar-1966  MR#: [id number]  JQB#:[id number]  PCP: Drake Leach, MD GYN:  [name] SU: [name] OTHER MD: [name]  CHIEF COMPLAINT:  Right Breast Cancer x 2 occurrences, now with metastatic disease as of December 2014   HISTORY OF PRESENT ILLNESS: This patient was previously followed by [name], and is transferred to [name] service as of 08/20/2013.  At the age of 48, the patient had a screening mammogram as a baseline. She had recently given birth and was on birth control pills at that time. The mammogram in November 2006 showed an area of architectural distortion in the left lower inner quadrant. Subsequently an ultrasound was obtained of the left breast showing a 2.0 x 1.5 x 1.4 cm mass, at the 8:00 position, 4 cm from the nipple. A core biopsy performed on 10/21/2005 showed an invasive mammary carcinoma, ER +70%, PR +41%, HER-2/neu negative, with proliferation marker of 38%. [phone])  Breast MRI on 11/08/2005 confirmed a 2.5 cm spiculated mass in the left lower inner quadrant with 3 small satellite nodules adjacent to the primary mass: 3.5 cm posterior medial to the primary mass, measuring 9 mm; 1.5 cm anterior medial to the primary mass measuring 5 mm; and 2 cm medial to the primary mass measuring 5 mm. No axillary adenopathy was noted. No suspicious masses or enhancement are noted in the right breast.  Kristin Hoffman underwentt left lumpectomy under the care of Dr. 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 2 of 23 lymph nodes were involved.  Margins were clear.  She received adjuvant chemotherapy consisting of 6 q. three-week doses of docetaxel/doxorubicin/cyclophosphamide given between January 2007 and may of 2007, with last dose on 04/20/2006. She underwent radiation therapy completed 07/11/2006, after which she began on tamoxifen in early  August 2007.  She underwent hysterectomy with bilateral salpingo-oophorectomy on 07/15/2008, and was started on letrozole in October of 2009.  A mammogram 11/10/2009 showed microcalcifications in the left breast, and a subsequent biopsy on 11/18/2009 confirmed invasive mammary carcinoma in the left breast. PET and CT of the chest showed no evidence of metastasis in January 2011.  She underwent bilateral mastectomies 01/25/2010, with the right breast showing no evidence of malignancy; in the left breast showing a 1.0 cm grade 2 invasive ductal carcinoma with high-grade DCIS. Tumor was ER +22%, PR negative, HER-2/neu negative, with proliferation marker of 13%. Margins were clear. Patient underwent concurrent left latissimus flap reconstruction and right implant reconstruction.  She received additional chemotherapy with one cycle of carboplatin/gemcitabine given on 03/11/2010. She then received 5 q. three-week cycles of IV CMF between 03/25/2010 and 06/17/2010.  She was started on exemestane in January 2012 and continued until late November 2013 when she was hospitalized for apparent TIA.   Her subsequent history is as detailed below   INTERVAL HISTORY: Kristin Hoffman returns today for followup of her metastatic breast cancer. She is doing clinically very well, taking care of her 2 children and husband, doing all her housework, shopping, driving, etc. She is not exercising regularly.  REVIEW OF SYSTEMS: Kristin Hoffman "feels fine". She has mild sinus symptoms. She bruises easily. A detailed review of systems today was otherwise entirely negative  PAST MEDICAL HISTORY: Past Medical History  Diagnosis Date  . Breast cancer   . Depression   . Reflux   . Cancer   . Hx-TIA (transient ischemic attack) 11/22/2013  .  Chronic headaches 11/22/2013  . Anxiety 11/22/2013  . Breast cancer metastasized to multiple sites 12/24/2013    PAST SURGICAL HISTORY: Past Surgical History  Procedure Laterality Date  . Mastectomy w/  nodes partial    . Mastectomy    . Breast reconstruction    . Portacath placement      x 2  . Portacath removal      x 2  . Abdominal hysterectomy    . Tee without cardioversion  11/12/2012    Procedure: TRANSESOPHAGEAL ECHOCARDIOGRAM (TEE);  Surgeon: Candee Furbish, MD;  Location: [facility] ENDOSCOPY;  Service: Cardiovascular;  Laterality: N/A;  This TEE may be Dr. Marlou Porch or a LHC Dr    FAMILY HISTORY Both parents are alive and well. Patient has one sister who is 40 years younger and is in good health. No other history of breast or ovarian cancer in the family. No family history on file.  GYNECOLOGIC HISTORY:   (Updated January 2015) G2P2, menarche at age 19 with irregular menses. On birth control pills in the past. On Clomid to induce ovulation with first pregnancy. Also had preeclampsia with first pregnancy. Status post hysterectomy and bilateral salpingo-oophorectomy in August 2009.  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 She is originally from [place]. She's been married to [place], for 13 years. He works as an Pharmacist, hospital at [employer].   ADVANCED DIRECTIVES:    HEALTH MAINTENANCE:  (Updated 11/22/2013) History  Substance Use Topics  . Smoking status: Never Smoker   . Smokeless tobacco: Never Used  . Alcohol Use: Yes     Comment: rarely     Colonoscopy:  Never  PAP: S/P LAVH/BSO in August 2009  Bone density: Never  Lipid panel: Not on file   No Known Allergies  Current Outpatient Prescriptions  Medication Sig Dispense Refill  . aspirin 81 MG tablet Take 81 mg by mouth daily.      . cetirizine (ZYRTEC) 10 MG tablet Take 10 mg by mouth daily.        . cholecalciferol (VITAMIN D) 400 UNITS TABS tablet Take 1,000 Units by mouth daily.      Marland Kitchen escitalopram (LEXAPRO) 20 MG tablet Take 20 mg by mouth daily with breakfast.       . LORazepam (ATIVAN) 0.5 MG tablet Take 1 mg by mouth  every 4 (four) hours as needed for anxiety.      . Multiple Vitamin (MULTIVITAMIN) tablet Take 1 tablet by mouth daily.      Marland Kitchen omeprazole (PRILOSEC) 40 MG capsule Take 40 mg by mouth daily.       . traZODone (DESYREL) 50 MG tablet Take 1 tablet (50 mg total) by mouth at bedtime.  30 tablet  3  . zolpidem (AMBIEN) 10 MG tablet Take 0.5-1 tablets (5-10 mg total) by mouth at bedtime. As needed for insomnia  20 tablet  0   No current facility-administered medications for this visit.    OBJECTIVE: Young white woman in no acute distress Filed Vitals:   03/25/14 1605  BP: 147/91  Pulse: 93  Temp: 97.2 F (36.2 C)  Resp: 18  Body mass index is 33.28 kg/(m^2).  ECOG: 0 Filed Weights   03/25/14 1605  Weight: 194 lb (87.998 kg)   Sclerae unicteric, pupils equal and reactive Oropharynx clear and moist No measurable cervical or supraclavicular adenopathy Lungs no rales or rhonchi Heart regular  rate and rhythm Abd soft, nontender, positive bowel sounds MSK no focal spinal tenderness, no upper extremity lymphedema Neuro: nonfocal, well oriented, appropriate affect Breasts: Status post bilateral mastectomies. No evidence of local recurrence. Both axillae are benign  LAB RESULTS:  Lab Results  Component Value Date   WBC 5.4 03/25/2014   NEUTROABS 3.4 03/25/2014   HGB 13.5 03/25/2014   HCT 42.0 03/25/2014   MCV 91.1 03/25/2014   PLT 209 03/25/2014      Chemistry      Component Value Date/Time   NA 142 03/25/2014 1537   NA 142 11/12/2012 0735   K 4.1 03/25/2014 1537   K 3.8 11/12/2012 0735   CL 104 11/14/2012 1407   CL 104 11/12/2012 0735   CO2 27 03/25/2014 1537   CO2 30 11/12/2012 0735   BUN 11.0 03/25/2014 1537   BUN 13 11/12/2012 0735   CREATININE 0.9 03/25/2014 1537   CREATININE 0.97 11/12/2012 0735      Component Value Date/Time   CALCIUM 9.7 03/25/2014 1537   CALCIUM 9.1 11/12/2012 0735   ALKPHOS 73 03/25/2014 1537   ALKPHOS 69 11/12/2012 0735   AST 26 03/25/2014 1537   AST 16  11/12/2012 0735   ALT 16 03/25/2014 1537   ALT 13 11/12/2012 0735   BILITOT 0.55 03/25/2014 1537   BILITOT 0.9 11/12/2012 0735      STUDIES: Mr Liver W Contrast  03/20/2014   CLINICAL DATA:  Metastatic breast cancer.  Follow-up liver lesion.  EXAM: MRI ABDOMEN WITHOUT AND WITH CONTRAST  TECHNIQUE: Multiplanar multisequence MR imaging of the abdomen was performed both before and after the administration of intravenous contrast.  CONTRAST:  52m MULTIHANCE GADOBENATE DIMEGLUMINE 529 MG/ML IV SOLN  COMPARISON:  PET-CT 12/02/2013, abdominal MRI 11/22/2013 and abdominal CT 12/23/2005.  FINDINGS: The dominant lesion in the dome of the right hepatic lobe which was hypermetabolic on PET-CT appears slightly larger, measuring 2.2 x 1.9 cm on image 10 of series 4. This lesion demonstrates persistent nonspecific enhancement following contrast.  There are several new liver lesions with T2 hyperintensity and low T1 signal, best seen on series 4 and 6. Most of these are less than a cm in diameter. There is a 10 mm lesion within the lateral segment of the left hepatic lobe on image 18. All of these lesions are enhancing following contrast and consistent with progressive metastatic disease.  There is no adrenal mass. Small renal cortical cysts are stable. The gallbladder, biliary system, pancreas and spleen appear normal. Portacaval and retroperitoneal adenopathy appears stable.  No osseous metastases are identified. There is no abnormal intradural enhancement. Bilateral breast implants are noted.  IMPRESSION: Slight interval worsening of hepatic metastatic disease. Retroperitoneal and porta hepatis adenopathy (also hypermetabolic on PET) does not appear significantly changed.   Electronically Signed   By: BCamie PatienceM.D.   On: 03/20/2014 16:04   ASSESSMENT: 48y.o. [place] woman  (1)  status post left lumpectomy under the care of Dr. 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 2 of 23 lymph nodes were involved.  Margins were clear.  (2) status post adjuvant chemotherapy consisting of 6 q. three-week doses of docetaxel/doxorubicin/cyclophosphamide given between January 2007 and may of 2007, with last dose on 04/20/2006.  (3) status post radiation therapy to the left breast, completed 07/11/2006  (4) began on tamoxifen in early August 2007 and continued until October  2009. She status post hysterectomy with bilateral salpingo-oophorectomy on 07/15/2008, and was started on letrozole in October of 2009.  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 PET and CT of the chest showed no evidence of metastasis in January 2011.  (6) status post bilateral mastectomies 01/25/2010, with the right breast showing no evidence of malignancy; in the left breast showing a 1.0 cm grade 2 invasive ductal carcinoma with high-grade DCIS. Tumor was ER +22%, PR negative, HER-2/neu negative, with proliferation marker of 13%. Margins were clear. Patient underwent concurrent left latissimus flap reconstruction and right implant reconstruction.  (7)  status post additional chemotherapy with one cycle of carboplatin/gemcitabine given on 03/11/2010. She then received 5 q. three-week cycles of IV CMF between 03/25/2010 and 06/17/2010.  (8) started on exemestane in January 2012 and continued until late November 2013 when she was hospitalized for apparent TIA at which time her exemestane was stopped and not resumed  (9) METASTATIC DISEASE: evidence of disease recurence noted on chest CT, liver MRI and PET scan in December 2014, with suspicious lung nodules, lymphadenopathy, and 3 lesions in the right lobe of the liver, but no evidence of bony disease and no evidence of brain involvement by brain MRI September 2014. Biopsy of a left  supraclavicular lymph node on 12/11/2013 confirmed metastatic carcinoma, estrogen receptor 45% positive, progesterone receptor 17% positive, with no HER-2 amplification.  (10) fulvestrant started 12/03/2013, discontinued 03/25/2014 with progression  (11) exemestane/ everolimus to start 03/31/2014   PLAN: Kristin Hoffman is doing remarkably well clinically, but unfortunately her tumor is growing right through the fulvestrant. We are stopping that after today's dose. We spent approximately 45 minutes discussing options.  The first decision is whether we need to go to chemotherapy at this point, and I think we have time to try more anti-estrogens. Having decided that, we reviewed the anti-estrogens she has received. Her tamoxifen was changed to letrozole for theoretical reasons, not because of progression. Her exemestane was stopped in 2012 because of concerns regarding her TIA. Accordingly neither of those was changed because of progression, and so they remain possibilities.  On the other hand there was clear progression to letrozole and now through fulvestrant.  My vote is for her exemestane and everolimus. We discussed the possible toxicities, side effects and complications of this combination. I am putting in the exemestane prescription for her now, and I have given my nurse a prescription for everolimus to be placed through [facility]. I am hoping we can start the combination April 20. Accordingly Kristin Hoffman will see me again a week later just to make sure everything is in place and she is at least initially tolerating it well.  The plan then would be to repeat a liver MRI in approximately 3 months.  In addition today we discussed [facility]. If we can get the tissue over there this month than by the time we discover whether everolimus and exemestane is or is not working we may have additional data for targeted therapy.  Kristin Hoffman has a good understanding of the overall plan. She agrees with it. She knows the goal  of treatment in her case is control. She will call with any problems that may develop before her next visit here.   Chauncey Cruel, MD   03/25/2014 4:46 PM

## 2014-03-26 ENCOUNTER — Encounter: Payer: Self-pay | Admitting: Oncology

## 2014-03-26 ENCOUNTER — Telehealth: Payer: Self-pay

## 2014-03-26 NOTE — Progress Notes (Signed)
Faxed afinitor prescription to [facility]

## 2014-03-26 NOTE — Telephone Encounter (Signed)
Gave Kristin Hoffman in [name] the new  prescription for Afinitor to send to [facility] or [facility] to determine coverage for patient.

## 2014-03-27 ENCOUNTER — Other Ambulatory Visit: Payer: Self-pay | Admitting: *Deleted

## 2014-03-27 MED ORDER — EVEROLIMUS 10 MG PO TABS: 10.0000 mg | ORAL_TABLET | Freq: Every day | ORAL | Status: DC

## 2014-03-27 NOTE — Telephone Encounter (Signed)
RECEIVED A FAX FROM [facility] CONCERNING A NOTICE OF RX TRANSFER TO [facility]. PHONE [phone] FAX [phone].

## 2014-03-28 ENCOUNTER — Other Ambulatory Visit: Payer: Self-pay | Admitting: *Deleted

## 2014-03-28 MED ORDER — EXEMESTANE 25 MG PO TABS: 25.0000 mg | ORAL_TABLET | Freq: Every day | ORAL | Status: DC

## 2014-04-03 ENCOUNTER — Telehealth: Payer: Self-pay

## 2014-04-03 NOTE — Telephone Encounter (Signed)
Returned pt call - wants generic of afinitor d/t cost.  [facility] telling her scrip is for name brand.  [facility] - no [facility]ist available - left msg for them to call tomorrow.

## 2014-04-06 ENCOUNTER — Other Ambulatory Visit: Payer: Self-pay | Admitting: *Deleted

## 2014-04-06 DIAGNOSIS — C50919 Malignant neoplasm of unspecified site of unspecified female breast: Secondary | ICD-10-CM

## 2014-04-07 ENCOUNTER — Ambulatory Visit: Payer: BC Managed Care – PPO | Admitting: Oncology

## 2014-04-07 ENCOUNTER — Telehealth: Payer: Self-pay | Admitting: *Deleted

## 2014-04-07 ENCOUNTER — Other Ambulatory Visit: Payer: BC Managed Care – PPO

## 2014-04-07 MED ORDER — FIRST-DUKES MOUTHWASH MT SUSP: OROMUCOSAL | Status: DC

## 2014-04-07 NOTE — Telephone Encounter (Signed)
Marisella called to state onset of symptoms occurring post starting the Afinitor.  She states she started the medication on Friday pm and developed some intermittant throat soreness on Saturday.  She continued with the medication and now she states her throat is more sore and with tongue soreness as well as noted sore on the side of tongue.  This RN informed pt - prescription will be called in for mouth rinse which she can swallow for throat benefit.  She is to hold the Afinitor tonight and call this RN tomorrow with update to assess benefit with mouthwash.  This RN called prescription to pharmacy and verified covered by insurance ( $ 10 copay ).

## 2014-04-10 ENCOUNTER — Other Ambulatory Visit: Payer: Self-pay | Admitting: Oncology

## 2014-04-10 ENCOUNTER — Telehealth: Payer: Self-pay | Admitting: *Deleted

## 2014-04-10 ENCOUNTER — Telehealth: Payer: Self-pay | Admitting: Oncology

## 2014-04-10 NOTE — Telephone Encounter (Signed)
, °

## 2014-04-10 NOTE — Telephone Encounter (Signed)
This RN called pt to obtain update post call on Monday regarding throat and mouth soreness post initiating Afinitor.  Kristin Hoffman states she held 1 dose of the Afinitor and is using the MMW with noted benefit. Throat soreness is gone but she is continuing with tongue " soreness and sensitivity"  She feels the MMW is helping and has resumed the Afinitor.  Note pt needs an appointment scheduled for follow up early next week and is awaiting call.  This RN left a message with scheduling per above appointment for appropriate follow up.

## 2014-04-18 ENCOUNTER — Other Ambulatory Visit: Payer: Self-pay | Admitting: *Deleted

## 2014-04-18 ENCOUNTER — Telehealth: Payer: Self-pay | Admitting: *Deleted

## 2014-04-18 ENCOUNTER — Ambulatory Visit (HOSPITAL_BASED_OUTPATIENT_CLINIC_OR_DEPARTMENT_OTHER): Payer: BC Managed Care – PPO | Admitting: Oncology

## 2014-04-18 VITALS — BP 127/84 | HR 79 | Temp 98.8°F | Resp 20 | Ht 64.0 in | Wt 191.4 lb

## 2014-04-18 DIAGNOSIS — K1370 Unspecified lesions of oral mucosa: Secondary | ICD-10-CM

## 2014-04-18 DIAGNOSIS — K7689 Other specified diseases of liver: Secondary | ICD-10-CM

## 2014-04-18 DIAGNOSIS — R5383 Other fatigue: Secondary | ICD-10-CM

## 2014-04-18 DIAGNOSIS — Z170 Estrogen receptor positive status [ER+]: Secondary | ICD-10-CM

## 2014-04-18 DIAGNOSIS — C50319 Malignant neoplasm of lower-inner quadrant of unspecified female breast: Secondary | ICD-10-CM

## 2014-04-18 DIAGNOSIS — F419 Anxiety disorder, unspecified: Secondary | ICD-10-CM

## 2014-04-18 DIAGNOSIS — Z8673 Personal history of transient ischemic attack (TIA), and cerebral infarction without residual deficits: Secondary | ICD-10-CM

## 2014-04-18 DIAGNOSIS — C770 Secondary and unspecified malignant neoplasm of lymph nodes of head, face and neck: Secondary | ICD-10-CM

## 2014-04-18 DIAGNOSIS — R51 Headache: Secondary | ICD-10-CM

## 2014-04-18 DIAGNOSIS — C50312 Malignant neoplasm of lower-inner quadrant of left female breast: Secondary | ICD-10-CM

## 2014-04-18 DIAGNOSIS — C50919 Malignant neoplasm of unspecified site of unspecified female breast: Secondary | ICD-10-CM

## 2014-04-18 DIAGNOSIS — R519 Headache, unspecified: Secondary | ICD-10-CM

## 2014-04-18 DIAGNOSIS — R21 Rash and other nonspecific skin eruption: Secondary | ICD-10-CM

## 2014-04-18 DIAGNOSIS — R5381 Other malaise: Secondary | ICD-10-CM

## 2014-04-18 MED ORDER — METHYLPREDNISOLONE 4 MG PO KIT: PACK | ORAL | Status: DC

## 2014-04-18 MED ORDER — ZOLPIDEM TARTRATE 10 MG PO TABS: 10.0000 mg | ORAL_TABLET | Freq: Every evening | ORAL | Status: AC | PRN

## 2014-04-18 NOTE — Progress Notes (Signed)
ID: Kristin Hoffman OB: Aug 15, 1966  MR#: [id number]  ULA#:[id number]  PCP: Drake Leach, MD GYN:  [name] SU: [name] OTHER MD: [name]  CHIEF COMPLAINT:  Stage IV breast cancer/ under treatment  HISTORY OF PRESENT ILLNESS: This patient was previously followed by [name], and is transferred to [name] service as of 08/20/2013.  At the age of 48, the patient had a screening mammogram as a baseline. She had recently given birth and was on birth control pills at that time. The mammogram in November 2006 showed an area of architectural distortion in the left lower inner quadrant. Subsequently an ultrasound was obtained of the left breast showing a 2.0 x 1.5 x 1.4 cm mass, at the 8:00 position, 4 cm from the nipple. A core biopsy performed on 10/21/2005 showed an invasive mammary carcinoma, ER +70%, PR +41%, HER-2/neu negative, with proliferation marker of 38%. [phone])  Breast MRI on 11/08/2005 confirmed a 2.5 cm spiculated mass in the left lower inner quadrant with 3 small satellite nodules adjacent to the primary mass: 3.5 cm posterior medial to the primary mass, measuring 9 mm; 1.5 cm anterior medial to the primary mass measuring 5 mm; and 2 cm medial to the primary mass measuring 5 mm. No axillary adenopathy was noted. No suspicious masses or enhancement are noted in the right breast.  Kristin Hoffman underwentt left lumpectomy under the care of Dr. 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 2 of 23 lymph nodes were involved.  Margins were clear.  She received adjuvant chemotherapy consisting of 6 q. three-week doses of docetaxel/doxorubicin/cyclophosphamide given between January 2007 and may of 2007, with last dose on 04/20/2006. She underwent radiation therapy completed 07/11/2006, after which she began on tamoxifen in early August 2007.  She underwent hysterectomy with  bilateral salpingo-oophorectomy on 07/15/2008, and was started on letrozole in October of 2009.  A mammogram 11/10/2009 showed microcalcifications in the left breast, and a subsequent biopsy on 11/18/2009 confirmed invasive mammary carcinoma in the left breast. PET and CT of the chest showed no evidence of metastasis in January 2011.  She underwent bilateral mastectomies 01/25/2010, with the right breast showing no evidence of malignancy; in the left breast showing a 1.0 cm grade 2 invasive ductal carcinoma with high-grade DCIS. Tumor was ER +22%, PR negative, HER-2/neu negative, with proliferation marker of 13%. Margins were clear. Patient underwent concurrent left latissimus flap reconstruction and right implant reconstruction.  She received additional chemotherapy with one cycle of carboplatin/gemcitabine given on 03/11/2010. She then received 5 q. three-week cycles of IV CMF between 03/25/2010 and 06/17/2010.  She was started on exemestane in January 2012 and continued until late November 2013 when she was hospitalized for apparent TIA.   Her subsequent history is as detailed below  INTERVAL HISTORY: Aften returns today for followup of her metastatic breast cancer. Since her last visit here, 47he added FNA to work to her aromatase inhibitor treatment. The first day was April 24. After about 10 days she noted a rash developing over her hands, wrists and then the inner thighs. She stopped her affinitor and called to schedule this visit for further evaluation and direction  REVIEW OF SYSTEMS: Kristin Hoffman states the rash is itchy, but not painful. Since stopping the everolimus 3 days ago there has been no change. Aside from that, she has had some mouth sores, difficulty swallowing, sore throat, hoarseness, and also  some heartburn. She describes herself is mild to moderately fatigued. She bruises easily. She has occasional headaches, not more intense or frequent than prior. She has hot flashes, as before. There  have been no significant cough or. She denies shortness of breath. A detailed review of systems was otherwise stable  PAST MEDICAL HISTORY: Past Medical History  Diagnosis Date  . Breast cancer   . Depression   . Reflux   . Cancer   . Hx-TIA (transient ischemic attack) 11/22/2013  . Chronic headaches 11/22/2013  . Anxiety 11/22/2013  . Breast cancer metastasized to multiple sites 12/24/2013    PAST SURGICAL HISTORY: Past Surgical History  Procedure Laterality Date  . Mastectomy w/ nodes partial    . Mastectomy    . Breast reconstruction    . Portacath placement      x 2  . Portacath removal      x 2  . Abdominal hysterectomy    . Tee without cardioversion  11/12/2012    Procedure: TRANSESOPHAGEAL ECHOCARDIOGRAM (TEE);  Surgeon: Candee Furbish, MD;  Location: [facility];  Service: Cardiovascular;  Laterality: N/A;  This TEE may be Dr. Marlou Porch or a LHC Dr    FAMILY HISTORY Both parents are alive and well. Patient has one sister who is 59 years younger and is in good health. No other history of breast or ovarian cancer in the family. No family history on file.  GYNECOLOGIC HISTORY:   (Updated January 2015) G2P2, menarche at age 48 with irregular menses. On birth control pills in the past. On Clomid to induce ovulation with first pregnancy. Also had preeclampsia with first pregnancy. Status post hysterectomy and bilateral salpingo-oophorectomy in August 2009.  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 She is originally from [place]. She's been married to [place], for 13 years. He works as an Pharmacist, hospital at [employer].   ADVANCED DIRECTIVES:    HEALTH MAINTENANCE:  (Updated 11/22/2013) History  Substance Use Topics  . Smoking status: Never Smoker   . Smokeless tobacco: Never Used  . Alcohol Use: Yes     Comment: rarely     Colonoscopy:  Never  PAP: S/P LAVH/BSO in August  2009  Bone density: Never  Lipid panel: Not on file   No Known Allergies  Current Outpatient Prescriptions  Medication Sig Dispense Refill  . aspirin 81 MG tablet Take 81 mg by mouth daily.      . cetirizine (ZYRTEC) 10 MG tablet Take 10 mg by mouth daily.        . Cholecalciferol 10000 UNITS CAPS Take 1 capsule by mouth daily.      . Diphenhyd-Hydrocort-Nystatin (FIRST-DUKES MOUTHWASH) SUSP 5-10 ml QID PRN- swish -swallow and or spit  240 mL  3  . escitalopram (LEXAPRO) 20 MG tablet Take 20 mg by mouth daily with breakfast.       . everolimus (AFINITOR) 10 MG tablet Take 1 tablet (10 mg total) by mouth daily.  30 tablet  3  . exemestane (AROMASIN) 25 MG tablet Take 1 tablet (25 mg total) by mouth daily after breakfast.  30 tablet  12  . LORazepam (ATIVAN) 0.5 MG tablet Take 1 mg by mouth every 4 (four) hours as needed for anxiety.      . Multiple Vitamin (MULTIVITAMIN) tablet Take 1 tablet by mouth daily.      Marland Kitchen omeprazole (PRILOSEC) 40 MG capsule  Take 40 mg by mouth daily.        No current facility-administered medications for this visit.    OBJECTIVE: Young white woman who appears stated age 48 Vitals:   04/18/14 1212  BP: 127/84  Pulse: 79  Temp: 98.8 F (37.1 C)  Resp: 20  Body mass index is 32.84 kg/(m^2).  ECOG: 1 Filed Weights   04/18/14 1212  Weight: 191 lb 6.4 oz (86.818 kg)   Sclerae unicteric, EOMs intact Oropharynx clear and moist--no active lesions noted Lungs no rales or rhonchi, good excursion bilaterally Heart regular rate and rhythm Abd soft, nontender, positive bowel sounds MSK no focal spinal tenderness, no upper extremity lymphedema Neuro: nonfocal, well oriented, anxious affect Breasts: Status post bilateral mastectomies. No evidence of local recurrence. Both axillae are benign Skin: On the dorsum of both wrists and a little bit on both forearms there is a minimally palpable slightly erythematous rash, non-confluent. There is no ulceration and no  vesiculation  LAB RESULTS:  Lab Results  Component Value Date   WBC 5.4 03/25/2014   NEUTROABS 3.4 03/25/2014   HGB 13.5 03/25/2014   HCT 42.0 03/25/2014   MCV 91.1 03/25/2014   PLT 209 03/25/2014      Chemistry      Component Value Date/Time   NA 142 03/25/2014 1537   NA 142 11/12/2012 0735   K 4.1 03/25/2014 1537   K 3.8 11/12/2012 0735   CL 104 11/14/2012 1407   CL 104 11/12/2012 0735   CO2 27 03/25/2014 1537   CO2 30 11/12/2012 0735   BUN 11.0 03/25/2014 1537   BUN 13 11/12/2012 0735   CREATININE 0.9 03/25/2014 1537   CREATININE 0.97 11/12/2012 0735      Component Value Date/Time   CALCIUM 9.7 03/25/2014 1537   CALCIUM 9.1 11/12/2012 0735   ALKPHOS 73 03/25/2014 1537   ALKPHOS 69 11/12/2012 0735   AST 26 03/25/2014 1537   AST 16 11/12/2012 0735   ALT 16 03/25/2014 1537   ALT 13 11/12/2012 0735   BILITOT 0.55 03/25/2014 1537   BILITOT 0.9 11/12/2012 0735      STUDIES: No results found.  ASSESSMENT: 48 y.o. [place] woman  (1)  status post left lumpectomy under the care of Dr. 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 2 of 23 lymph nodes were involved.  Margins were clear.  (2) status post adjuvant chemotherapy consisting of 6 q. three-week doses of docetaxel/doxorubicin/cyclophosphamide given between January 2007 and may of 2007, with last dose on 04/20/2006.  (3) status post radiation therapy to the left breast, completed 07/11/2006  (4) began on tamoxifen in early August 2007 and continued until October 2009. She status post hysterectomy with bilateral salpingo-oophorectomy on 07/15/2008, and was started on letrozole in October of 2009.  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 PET and CT of the chest showed no evidence of metastasis in January 2011.  (6)  status post bilateral mastectomies 01/25/2010, with the right breast showing no evidence of malignancy; in the left breast showing a 1.0 cm grade 2 invasive ductal carcinoma with high-grade DCIS. Tumor was ER +22%, PR negative, HER-2/neu negative, with proliferation marker of 13%. Margins were clear. Patient underwent concurrent left latissimus flap reconstruction and right implant reconstruction.  (7)  status post additional chemotherapy with  one cycle of carboplatin/gemcitabine given on 03/11/2010. She then received 5 q. three-week cycles of IV CMF between 03/25/2010 and 06/17/2010.  (8) started on exemestane in January 2012 and continued until late November 2013 when she was hospitalized for apparent TIA at which time her exemestane was stopped and not resumed  (9) METASTATIC DISEASE: evidence of disease recurence noted on chest CT, liver MRI and PET scan in December 2014, with suspicious lung nodules, lymphadenopathy, and 3 lesions in the right lobe of the liver, but no evidence of bony disease and no evidence of brain involvement by brain MRI September 2014. Biopsy of a left supraclavicular lymph node on 12/11/2013 confirmed metastatic carcinoma, estrogen receptor 45% positive, progesterone receptor 17% positive, with no HER-2 amplification.  (10) fulvestrant started 12/03/2013, discontinued 03/25/2014 with progression  (11) exemestane/ everolimus started 04/04/2014; everolimus held 04/14/2014, with skin rash and mouth soreness   PLAN: Kristin Hoffman likely is having a reaction to the everolimus, and we are going to continue off that medication. I am writing her a Medrol Dosepak today and she will let us know over the next few days if that does not take care of the problem. She will then see me in a couple of weeks. Assuming the acute problem has resolved, we will likely go back to everolimus at half dose. If the rash or mouth problems resume, we will have to discontinue that agent and consider a different  form of estrogen blockade  Kristin Hoffman has a good understanding of the overall plan. She agrees with it. She knows the goal of treatment in her case is control. She will call with any problems that may develop before her next visit here   Chauncey Cruel, MD   04/18/2014 12:28 PM

## 2014-04-18 NOTE — Telephone Encounter (Signed)
Pt called to state onset of rash that started Sunday 5/3 on her wrist- bilaterally- then progressed to her hands.  Rash is now developing on inside of thighs-  Jawanna describes rash as red and raised and " very itchy ".  Pt will come in today for review of above for appropriate management.

## 2014-04-24 ENCOUNTER — Telehealth: Payer: Self-pay | Admitting: *Deleted

## 2014-04-24 NOTE — Telephone Encounter (Signed)
This RN spoke with pt per her call with concerns due to several new symptoms occurring.  Amulya states she has developed an acne like rash on her neck, face and scalp.  She completed the steroid dose yesterday which helped her itchy palms and rash (which is not the same as above ).  She also developed diarrhea last night with approximately loose stools - every 30 minutes for about 12 hours " until I took 2 imodium ADs " " I was thinking maybe it was something I ate so I was waiting to see if it stopped on it's on ".  Last stool was at noon today.  Tyah has not been on the Afinitor since visit with MD on 04/18/2014.  The above will be given to MD for review for appropriate recommendation-   Pt is scheduled for follow up on Monday 04/28/2014.

## 2014-04-25 ENCOUNTER — Telehealth: Payer: Self-pay | Admitting: *Deleted

## 2014-04-25 NOTE — Telephone Encounter (Signed)
Called pt to F/U concerning developing diarrhea. Pt took Imodium AD. Had normal BM last night and none as of today. Pt thinks diarrhea has been resolved and we  will see her on 04/28/14. Message to be forwarded to [employer], PA-C.

## 2014-04-28 ENCOUNTER — Ambulatory Visit (HOSPITAL_BASED_OUTPATIENT_CLINIC_OR_DEPARTMENT_OTHER): Payer: BC Managed Care – PPO | Admitting: Oncology

## 2014-04-28 ENCOUNTER — Other Ambulatory Visit (HOSPITAL_BASED_OUTPATIENT_CLINIC_OR_DEPARTMENT_OTHER): Payer: BC Managed Care – PPO

## 2014-04-28 VITALS — BP 131/87 | HR 79 | Temp 98.9°F | Resp 18 | Ht 64.0 in | Wt 193.4 lb

## 2014-04-28 DIAGNOSIS — C50312 Malignant neoplasm of lower-inner quadrant of left female breast: Secondary | ICD-10-CM

## 2014-04-28 DIAGNOSIS — C50919 Malignant neoplasm of unspecified site of unspecified female breast: Secondary | ICD-10-CM

## 2014-04-28 DIAGNOSIS — C50319 Malignant neoplasm of lower-inner quadrant of unspecified female breast: Secondary | ICD-10-CM

## 2014-04-28 DIAGNOSIS — Z170 Estrogen receptor positive status [ER+]: Secondary | ICD-10-CM

## 2014-04-28 DIAGNOSIS — R21 Rash and other nonspecific skin eruption: Secondary | ICD-10-CM

## 2014-04-28 DIAGNOSIS — C770 Secondary and unspecified malignant neoplasm of lymph nodes of head, face and neck: Secondary | ICD-10-CM

## 2014-04-28 DIAGNOSIS — Z8673 Personal history of transient ischemic attack (TIA), and cerebral infarction without residual deficits: Secondary | ICD-10-CM

## 2014-04-28 LAB — COMPREHENSIVE METABOLIC PANEL (CC13)
ALBUMIN: 3.7 g/dL (ref 3.5–5.0)
ALT: 25 U/L (ref 0–55)
AST: 27 U/L (ref 5–34)
Alkaline Phosphatase: 56 U/L (ref 40–150)
Anion Gap: 11 mEq/L (ref 3–11)
BILIRUBIN TOTAL: 0.54 mg/dL (ref 0.20–1.20)
BUN: 7.7 mg/dL (ref 7.0–26.0)
CO2: 25 meq/L (ref 22–29)
Calcium: 9.2 mg/dL (ref 8.4–10.4)
Chloride: 107 mEq/L (ref 98–109)
Creatinine: 0.9 mg/dL (ref 0.6–1.1)
GLUCOSE: 88 mg/dL (ref 70–140)
Potassium: 3.4 mEq/L — ABNORMAL LOW (ref 3.5–5.1)
Sodium: 143 mEq/L (ref 136–145)
Total Protein: 6.7 g/dL (ref 6.4–8.3)

## 2014-04-28 LAB — CBC WITH DIFFERENTIAL/PLATELET
BASO%: 1.1 % (ref 0.0–2.0)
BASOS ABS: 0.1 10*3/uL (ref 0.0–0.1)
EOS ABS: 0.1 10*3/uL (ref 0.0–0.5)
EOS%: 1.8 % (ref 0.0–7.0)
HEMATOCRIT: 39.9 % (ref 34.8–46.6)
HEMOGLOBIN: 13 g/dL (ref 11.6–15.9)
LYMPH%: 30.4 % (ref 14.0–49.7)
MCH: 29.1 pg (ref 25.1–34.0)
MCHC: 32.4 g/dL (ref 31.5–36.0)
MCV: 89.9 fL (ref 79.5–101.0)
MONO#: 0.5 10*3/uL (ref 0.1–0.9)
MONO%: 9 % (ref 0.0–14.0)
NEUT#: 3.1 10*3/uL (ref 1.5–6.5)
NEUT%: 57.7 % (ref 38.4–76.8)
PLATELETS: 215 10*3/uL (ref 145–400)
RBC: 4.44 10*6/uL (ref 3.70–5.45)
RDW: 13 % (ref 11.2–14.5)
WBC: 5.3 10*3/uL (ref 3.9–10.3)
lymph#: 1.6 10*3/uL (ref 0.9–3.3)

## 2014-04-28 MED ORDER — DOXYCYCLINE HYCLATE 100 MG PO TABS: 100.0000 mg | ORAL_TABLET | Freq: Every day | ORAL | Status: DC

## 2014-04-28 NOTE — Progress Notes (Signed)
ID: Kristin Hoffman OB: [birth year]  MR#: [id number]  PJA#:[id number]  PCP: Drake Leach, MD GYN:  [name] SU: [name] OTHER MD: [name]  CHIEF COMPLAINT:  Stage IV breast cancer/ side effects of everolimus  HISTORY OF PRESENT ILLNESS: This patient was previously followed by [name], and is transferred to [name] service as of 08/20/2013.  At the age of 48, the patient had a screening mammogram as a baseline. She had recently given birth and was on birth control pills at that time. The mammogram in November 2006 showed an area of architectural distortion in the left lower inner quadrant. Subsequently an ultrasound was obtained of the left breast showing a 2.0 x 1.5 x 1.4 cm mass, at the 8:00 position, 4 cm from the nipple. A core biopsy performed on 10/21/2005 showed an invasive mammary carcinoma, ER +70%, PR +41%, HER-2/neu negative, with proliferation marker of 38%. [phone])  Breast MRI on 11/08/2005 confirmed a 2.5 cm spiculated mass in the left lower inner quadrant with 3 small satellite nodules adjacent to the primary mass: 3.5 cm posterior medial to the primary mass, measuring 9 mm; 1.5 cm anterior medial to the primary mass measuring 5 mm; and 2 cm medial to the primary mass measuring 5 mm. No axillary adenopathy was noted. No suspicious masses or enhancement are noted in the right breast.  [name] underwentt left lumpectomy under the care of Dr. 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 2 of 23 lymph nodes were involved.  Margins were clear.  She received adjuvant chemotherapy consisting of 6 q. three-week doses of docetaxel/doxorubicin/cyclophosphamide given between January 2007 and may of 2007, with last dose on 04/20/2006. She underwent radiation therapy completed 07/11/2006, after which she began on tamoxifen in early August 2007.  She underwent  hysterectomy with bilateral salpingo-oophorectomy on 07/15/2008, and was started on letrozole in October of 2009.  A mammogram 11/10/2009 showed microcalcifications in the left breast, and a subsequent biopsy on 11/18/2009 confirmed invasive mammary carcinoma in the left breast. PET and CT of the chest showed no evidence of metastasis in January 2011.  She underwent bilateral mastectomies 01/25/2010, with the right breast showing no evidence of malignancy; in the left breast showing a 1.0 cm grade 2 invasive ductal carcinoma with high-grade DCIS. Tumor was ER +22%, PR negative, HER-2/neu negative, with proliferation marker of 13%. Margins were clear. Patient underwent concurrent left latissimus flap reconstruction and right implant reconstruction.  She received additional chemotherapy with one cycle of carboplatin/gemcitabine given on 03/11/2010. She then received 5 q. three-week cycles of IV CMF between 03/25/2010 and 06/17/2010.  She was started on exemestane in January 2012 and continued until late November 2013 when she was hospitalized for apparent TIA.   Her subsequent history is as detailed below  INTERVAL HISTORY: [name] returns today for followup of her metastatic breast cancer. After her last visit here her everolimus was held and she was treated with a Medrol Dosepak. The rash she was experiencing began to clear almost immediately and has resolved just about entirely. However she developed acne from the steroids. She also felt very irritable. Those symptoms now have improved.  REVIEW OF SYSTEMS: Armeda is doing well now except for the problems just mentioned. Specifically she denies any unusual headaches, visual changes, dizziness, gait imbalance, nausea, or vomiting. She denies any cough, phlegm production or pleurisy. His been no change in bowel  or bladder habits. She denies pain, fever, or bleeding. A detailed review of systems today was otherwise stable  PAST MEDICAL HISTORY: Past Medical  History  Diagnosis Date  . Breast cancer   . Depression   . Reflux   . Cancer   . Hx-TIA (transient ischemic attack) 11/22/2013  . Chronic headaches 11/22/2013  . Anxiety 11/22/2013  . Breast cancer metastasized to multiple sites 12/24/2013    PAST SURGICAL HISTORY: Past Surgical History  Procedure Laterality Date  . Mastectomy w/ nodes partial    . Mastectomy    . Breast reconstruction    . Portacath placement      x 2  . Portacath removal      x 2  . Abdominal hysterectomy    . Tee without cardioversion  11/12/2012    Procedure: TRANSESOPHAGEAL ECHOCARDIOGRAM (TEE);  Surgeon: Candee Furbish, MD;  Location: [facility] ENDOSCOPY;  Service: Cardiovascular;  Laterality: N/A;  This TEE may be Dr. Marlou Porch or a LHC Dr    FAMILY HISTORY Both parents are alive and well. Patient has one sister who is 43 years younger and is in good health. No other history of breast or ovarian cancer in the family. No family history on file.  GYNECOLOGIC HISTORY:   (Updated January 2015) G2P2, menarche at age 67 with irregular menses. On birth control pills in the past. On Clomid to induce ovulation with first pregnancy. Also had preeclampsia with first pregnancy. Status post hysterectomy and bilateral salpingo-oophorectomy in August 2009.  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 She is originally from [place]. She's been married to [place], for 13 years. He works as an Pharmacist, hospital at [employer].   ADVANCED DIRECTIVES:    HEALTH MAINTENANCE:  (Updated 11/22/2013) History  Substance Use Topics  . Smoking status: Never Smoker   . Smokeless tobacco: Never Used  . Alcohol Use: Yes     Comment: rarely     Colonoscopy:  Never  PAP: S/P LAVH/BSO in August 2009  Bone density: Never  Lipid panel: Not on file   No Known Allergies  Current Outpatient Prescriptions  Medication Sig Dispense Refill  . aspirin  81 MG tablet Take 81 mg by mouth daily.      . cetirizine (ZYRTEC) 10 MG tablet Take 10 mg by mouth daily.        . Cholecalciferol 10000 UNITS CAPS Take 1 capsule by mouth daily.      . Diphenhyd-Hydrocort-Nystatin (FIRST-DUKES MOUTHWASH) SUSP 5-10 ml QID PRN- swish -swallow and or spit  240 mL  3  . escitalopram (LEXAPRO) 20 MG tablet Take 20 mg by mouth daily with breakfast.       . everolimus (AFINITOR) 10 MG tablet Take 1 tablet (10 mg total) by mouth daily.  30 tablet  3  . exemestane (AROMASIN) 25 MG tablet Take 1 tablet (25 mg total) by mouth daily after breakfast.  30 tablet  12  . LORazepam (ATIVAN) 0.5 MG tablet Take 1 mg by mouth every 4 (four) hours as needed for anxiety.      . methylPREDNISolone (MEDROL DOSEPAK) 4 MG tablet follow package directions  21 tablet  0  . Multiple Vitamin (MULTIVITAMIN) tablet Take 1 tablet by mouth daily.      Marland Kitchen omeprazole (PRILOSEC) 40 MG capsule Take 40 mg by mouth daily.       Marland Kitchen zolpidem (AMBIEN) 10  MG tablet Take 1 tablet (10 mg total) by mouth at bedtime as needed for sleep.  30 tablet  0   No current facility-administered medications for this visit.    OBJECTIVE: Young white woman who appears well Filed Vitals:   04/28/14 1559  BP: 131/87  Pulse: 79  Temp: 98.9 F (37.2 C)  Resp: 18  Body mass index is 33.18 kg/(m^2).  ECOG: 0 Filed Weights   04/28/14 1559  Weight: 193 lb 6.4 oz (87.726 kg)   Sclerae unicteric, pupils equal round and reactive Oropharynx clear and moist--I do not see a lesion on the tip of her tongue that she can nevertheless feel Lungs no rales or rhonchi Heart regular rate and rhythm Abd soft, nontender, positive bowel sounds, no masses palpated MSK no focal spinal tenderness, no upper extremity lymphedema Neuro: nonfocal, well oriented, anxious affect Breasts: Status post bilateral mastectomies. No evidence of local recurrence. Both axillae are benign Skin: The skin rash previously noted has completely resolved.  She has mild to moderate acne chiefly over her cheeks, without erythema  LAB RESULTS:  Lab Results  Component Value Date   WBC 5.3 04/28/2014   NEUTROABS 3.1 04/28/2014   HGB 13.0 04/28/2014   HCT 39.9 04/28/2014   MCV 89.9 04/28/2014   PLT 215 04/28/2014      Chemistry      Component Value Date/Time   NA 143 04/28/2014 1515   NA 142 11/12/2012 0735   K 3.4* 04/28/2014 1515   K 3.8 11/12/2012 0735   CL 104 11/14/2012 1407   CL 104 11/12/2012 0735   CO2 25 04/28/2014 1515   CO2 30 11/12/2012 0735   BUN 7.7 04/28/2014 1515   BUN 13 11/12/2012 0735   CREATININE 0.9 04/28/2014 1515   CREATININE 0.97 11/12/2012 0735      Component Value Date/Time   CALCIUM 9.2 04/28/2014 1515   CALCIUM 9.1 11/12/2012 0735   ALKPHOS 56 04/28/2014 1515   ALKPHOS 69 11/12/2012 0735   AST 27 04/28/2014 1515   AST 16 11/12/2012 0735   ALT 25 04/28/2014 1515   ALT 13 11/12/2012 0735   BILITOT 0.54 04/28/2014 1515   BILITOT 0.9 11/12/2012 0735      STUDIES: No results found.  ASSESSMENT: 48 y.o. [place] woman  (1)  status post left lumpectomy under the care of Dr. 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 2 of 23 lymph nodes were involved.  Margins were clear.  (2) status post adjuvant chemotherapy consisting of 6 q. three-week doses of docetaxel/doxorubicin/cyclophosphamide given between January 2007 and may of 2007, with last dose on 04/20/2006.  (3) status post radiation therapy to the left breast, completed 07/11/2006  (4) began on tamoxifen in early August 2007 and continued until October 2009. She status post hysterectomy with bilateral salpingo-oophorectomy on 07/15/2008, and was started on letrozole in October of 2009.  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 PET and CT of the chest  showed no evidence of metastasis in January 2011.  (6) status post bilateral mastectomies 01/25/2010, with the right breast showing no evidence of malignancy; in the left breast showing a 1.0 cm grade 2 invasive ductal carcinoma with high-grade DCIS. Tumor was ER +22%, PR negative, HER-2/neu negative, with proliferation marker of 13%. Margins were clear. Patient underwent concurrent  left latissimus flap reconstruction and right implant reconstruction.  (7)  status post additional chemotherapy with one cycle of carboplatin/gemcitabine given on 03/11/2010. She then received 5 q. three-week cycles of IV CMF between 03/25/2010 and 06/17/2010.  (8) started on exemestane in January 2012 and continued until late November 2013 when she was hospitalized for apparent TIA at which time her exemestane was stopped and not resumed  (9) METASTATIC DISEASE: evidence of disease recurence noted on chest CT, liver MRI and PET scan in December 2014, with suspicious lung nodules, lymphadenopathy, and 3 lesions in the right lobe of the liver, but no evidence of bony disease and no evidence of brain involvement by brain MRI September 2014. Biopsy of a left supraclavicular lymph node on 12/11/2013 confirmed metastatic carcinoma, estrogen receptor 45% positive, progesterone receptor 17% positive, with no HER-2 amplification.  (10) fulvestrant started 12/03/2013, discontinued 03/25/2014 with progression  (11) exemestane/ everolimus started 04/04/2014; everolimus held 04/14/2014, with skin rash and mouth soreness; resumed at 5 mg a day as of 04/29/2014  (12) [facility] referral placed 04/28/2049    PLAN: Elivia has recovered from the reaction she likely had to the everolimus. She still does have some acne related to the steroids she received. I think she would benefit from doxycycline 100 mg daily for a few days. The acne would result in any case with time but this will get her there little bit faster.  At this time I think we  are ready to go back to everolimus, at a lower dose, namely 5 mg daily. She is able to cut the 10 mg pills in half. If she tolerates this well, then the plan will be to continue another 8-10 weeks and restage. If she develops a rash again, we will go off the everolimus permanently and consider adding Ibrance.  We had discussed [facility] sometime ago but apparently never placed a definitive referral. I am doing that today.   Aliea has a good understanding of the overall plan. She agrees with it. She knows the goal of treatment in her case is control. She will call with any problems that may develop before her next visit here.  Chauncey Cruel, MD   04/28/2014 4:15 PM

## 2014-04-29 ENCOUNTER — Telehealth: Payer: Self-pay | Admitting: Oncology

## 2014-04-29 NOTE — Telephone Encounter (Signed)
lvm for pt regarding to June appt....mailed pt appt sched/avs and letter °

## 2014-05-01 ENCOUNTER — Telehealth: Payer: Self-pay | Admitting: *Deleted

## 2014-05-01 NOTE — Telephone Encounter (Signed)
Aayat called this RN stating onset recurrence of rash on hands post restarting the afinitor at 1/2 dose.  Per message she stated " I really want to tough it out and take this pill because I need it - but the itching and rash is pretty bad"  " I took two doses but have held today's due to how bad the symptoms are"  This RN reviewed above with MD and called pt with his recommendation-  Informed pt - MD wants her to hold the Afinitor- plan is to obtain Ibrance for her and follow up in 2 weeks.  This RN will obtain information on above and call pt with plan.  Rosan verbalized understanding of plan and is in agreement.

## 2014-05-01 NOTE — Telephone Encounter (Signed)
This RN obtain information on medication per internet - noted need to obtain via pt's insurance prescription coverage thru " [facility]".  This RN left message on VM of Ebony in managed care per above.  Information per website printed for further reference.

## 2014-05-09 ENCOUNTER — Encounter: Payer: Self-pay | Admitting: *Deleted

## 2014-05-09 ENCOUNTER — Other Ambulatory Visit: Payer: Self-pay | Admitting: *Deleted

## 2014-05-09 MED ORDER — PALBOCICLIB 125 MG PO CAPS: ORAL_CAPSULE | ORAL | Status: DC

## 2014-05-09 NOTE — Progress Notes (Unsigned)
Prescription for [facility] faxed to [facility].

## 2014-05-12 ENCOUNTER — Encounter: Payer: Self-pay | Admitting: *Deleted

## 2014-05-12 NOTE — Progress Notes (Signed)
RECEIVED A FAX FROM [facility] CONCERNING A CONFIRMATION OF FACSIMILE RECEIPT FOR PT. REFERRAL. 

## 2014-05-13 NOTE — Progress Notes (Signed)
RECEIVED A FAX FROM [facility] CONCERNING A NOTICE OF RX TRANSFER FOR IBRANCE TO [facility]. PHONE [phone] FAX [phone].

## 2014-05-14 ENCOUNTER — Encounter: Payer: Self-pay | Admitting: Oncology

## 2014-05-14 NOTE — Progress Notes (Signed)
Sent ibrance pa form to [employer]

## 2014-05-20 ENCOUNTER — Encounter: Payer: Self-pay | Admitting: Oncology

## 2014-05-20 NOTE — Progress Notes (Signed)
[employer] approved ibrance from 05/14/14-12/11/38

## 2014-05-23 ENCOUNTER — Ambulatory Visit (HOSPITAL_BASED_OUTPATIENT_CLINIC_OR_DEPARTMENT_OTHER): Payer: BC Managed Care – PPO | Admitting: Oncology

## 2014-05-23 ENCOUNTER — Telehealth: Payer: Self-pay | Admitting: Oncology

## 2014-05-23 ENCOUNTER — Other Ambulatory Visit: Payer: Self-pay | Admitting: *Deleted

## 2014-05-23 ENCOUNTER — Other Ambulatory Visit (HOSPITAL_BASED_OUTPATIENT_CLINIC_OR_DEPARTMENT_OTHER): Payer: BC Managed Care – PPO

## 2014-05-23 VITALS — BP 141/85 | HR 76 | Temp 98.2°F | Resp 18 | Ht 64.0 in | Wt 196.4 lb

## 2014-05-23 DIAGNOSIS — Z170 Estrogen receptor positive status [ER+]: Secondary | ICD-10-CM

## 2014-05-23 DIAGNOSIS — C50312 Malignant neoplasm of lower-inner quadrant of left female breast: Secondary | ICD-10-CM

## 2014-05-23 DIAGNOSIS — Z8673 Personal history of transient ischemic attack (TIA), and cerebral infarction without residual deficits: Secondary | ICD-10-CM

## 2014-05-23 DIAGNOSIS — C50319 Malignant neoplasm of lower-inner quadrant of unspecified female breast: Secondary | ICD-10-CM

## 2014-05-23 DIAGNOSIS — C50919 Malignant neoplasm of unspecified site of unspecified female breast: Secondary | ICD-10-CM

## 2014-05-23 DIAGNOSIS — G8929 Other chronic pain: Secondary | ICD-10-CM

## 2014-05-23 DIAGNOSIS — R51 Headache: Secondary | ICD-10-CM

## 2014-05-23 DIAGNOSIS — C770 Secondary and unspecified malignant neoplasm of lymph nodes of head, face and neck: Secondary | ICD-10-CM

## 2014-05-23 LAB — COMPREHENSIVE METABOLIC PANEL (CC13)
ALBUMIN: 3.7 g/dL (ref 3.5–5.0)
ALT: 14 U/L (ref 0–55)
ANION GAP: 8 meq/L (ref 3–11)
AST: 19 U/L (ref 5–34)
Alkaline Phosphatase: 64 U/L (ref 40–150)
BUN: 11.8 mg/dL (ref 7.0–26.0)
CO2: 27 mEq/L (ref 22–29)
Calcium: 9.2 mg/dL (ref 8.4–10.4)
Chloride: 108 mEq/L (ref 98–109)
Creatinine: 1.1 mg/dL (ref 0.6–1.1)
Glucose: 88 mg/dl (ref 70–140)
Potassium: 3.6 mEq/L (ref 3.5–5.1)
SODIUM: 143 meq/L (ref 136–145)
TOTAL PROTEIN: 7 g/dL (ref 6.4–8.3)
Total Bilirubin: 0.54 mg/dL (ref 0.20–1.20)

## 2014-05-23 LAB — CBC WITH DIFFERENTIAL/PLATELET
BASO%: 0.8 % (ref 0.0–2.0)
Basophils Absolute: 0.1 10*3/uL (ref 0.0–0.1)
EOS%: 1.3 % (ref 0.0–7.0)
Eosinophils Absolute: 0.1 10*3/uL (ref 0.0–0.5)
HEMATOCRIT: 42 % (ref 34.8–46.6)
HGB: 13.7 g/dL (ref 11.6–15.9)
LYMPH#: 1.5 10*3/uL (ref 0.9–3.3)
LYMPH%: 22.2 % (ref 14.0–49.7)
MCH: 29.4 pg (ref 25.1–34.0)
MCHC: 32.7 g/dL (ref 31.5–36.0)
MCV: 90.1 fL (ref 79.5–101.0)
MONO#: 0.4 10*3/uL (ref 0.1–0.9)
MONO%: 6 % (ref 0.0–14.0)
NEUT%: 69.7 % (ref 38.4–76.8)
NEUTROS ABS: 4.8 10*3/uL (ref 1.5–6.5)
Platelets: 228 10*3/uL (ref 145–400)
RBC: 4.66 10*6/uL (ref 3.70–5.45)
RDW: 13.8 % (ref 11.2–14.5)
WBC: 6.9 10*3/uL (ref 3.9–10.3)

## 2014-05-23 MED ORDER — PALBOCICLIB 125 MG PO CAPS: ORAL_CAPSULE | ORAL | Status: DC

## 2014-05-23 NOTE — Telephone Encounter (Signed)
per pof tp sch pt appt for labs-per GM to sch pt 7/13 @1  instead of 7/20-adv GM to chge on POF. Pt will sch labs every 2 weeks after next DR appt-+gave pt copy of sch

## 2014-05-23 NOTE — Progress Notes (Signed)
ID: Kristin Hoffman OB: 04-Sep-1966  MR#: [id number]  YTK#:[id number]  PCP: Drake Leach, MD GYN:  [name] SU: [name] OTHER MD: [name]  CHIEF COMPLAINT:  Stage IV breast cancer CURRENT TREATMENT: exemestane and palbociclib  HISTORY OF PRESENT ILLNESS: This patient was previously followed by [name], and was transferred to [name] service as of 08/20/2013.  At the age of 48, the patient had a screening mammogram as a baseline. She had recently given birth and was on birth control pills at that time. The mammogram in November 2006 showed an area of architectural distortion in the left lower inner quadrant. Subsequently an ultrasound was obtained of the left breast showing a 2.0 x 1.5 x 1.4 cm mass, at the 8:00 position, 4 cm from the nipple. A core biopsy performed on 10/21/2005 showed an invasive mammary carcinoma, ER +70%, PR +41%, HER-2/neu negative, with proliferation marker of 38%. [phone])  Breast MRI on 11/08/2005 confirmed a 2.5 cm spiculated mass in the left lower inner quadrant with 3 small satellite nodules adjacent to the primary mass: 3.5 cm posterior medial to the primary mass, measuring 9 mm; 1.5 cm anterior medial to the primary mass measuring 5 mm; and 2 cm medial to the primary mass measuring 5 mm. No axillary adenopathy was noted. No suspicious masses or enhancement are noted in the right breast.  Kristin Hoffman underwentt left lumpectomy under the care of Dr. 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 2 of 23 lymph nodes were involved.  Margins were clear.  She received adjuvant chemotherapy consisting of 6 q. three-week doses of docetaxel/doxorubicin/cyclophosphamide given between January 2007 and may of 2007, with last dose on 04/20/2006. She underwent radiation therapy completed 07/11/2006, after which she began on tamoxifen in early August 2007.  She  underwent hysterectomy with bilateral salpingo-oophorectomy on 07/15/2008, and was started on letrozole in October of 2009.  A mammogram 11/10/2009 showed microcalcifications in the left breast, and a subsequent biopsy on 11/18/2009 confirmed invasive mammary carcinoma in the left breast. PET and CT of the chest showed no evidence of metastasis in January 2011.  She underwent bilateral mastectomies 01/25/2010, with the right breast showing no evidence of malignancy; in the left breast showing a 1.0 cm grade 2 invasive ductal carcinoma with high-grade DCIS. Tumor was ER +22%, PR negative, HER-2/neu negative, with proliferation marker of 13%. Margins were clear. Patient underwent concurrent left latissimus flap reconstruction and right implant reconstruction.  She received additional chemotherapy with one cycle of carboplatin/gemcitabine given on 03/11/2010. She then received 5 q. three-week cycles of IV CMF between 03/25/2010 and 06/17/2010.  She was started on exemestane in January 2012 and continued until late November 2013 when she was hospitalized for apparent TIA.   Her subsequent history is as detailed below  INTERVAL HISTORY: Kristin Hoffman returns today for followup of her metastatic breast cancer accompanied by her older son. Since her last visit here she gave everolimus a second try at a lower dose. Within 10 days she was having the same problems as before. Accordingly we are going off the drug. She is here today to discuss next steps.  REVIEW OF SYSTEMS: Kristin Hoffman feels tired and her sleep pattern has changed because she started sleeping later in the morning and as she has trouble going to bed at night. She has ringing in her left ear. Both ears feels full. She does have some  sinus aside from this, with sniffles and a little bit of sore throat. She has a "red patch" on her stomach but she wanted me to take a look at. Otherwise a detailed review of systems today was noncontributory  PAST MEDICAL  HISTORY: Past Medical History  Diagnosis Date  . Breast cancer   . Depression   . Reflux   . Cancer   . Hx-TIA (transient ischemic attack) 11/22/2013  . Chronic headaches 11/22/2013  . Anxiety 11/22/2013  . Breast cancer metastasized to multiple sites 12/24/2013    PAST SURGICAL HISTORY: Past Surgical History  Procedure Laterality Date  . Mastectomy w/ nodes partial    . Mastectomy    . Breast reconstruction    . Portacath placement      x 2  . Portacath removal      x 2  . Abdominal hysterectomy    . Tee without cardioversion  11/12/2012    Procedure: TRANSESOPHAGEAL ECHOCARDIOGRAM (TEE);  Surgeon: Candee Furbish, MD;  Location: [facility];  Service: Cardiovascular;  Laterality: N/A;  This TEE may be Dr. Marlou Porch or a [facility] Dr    FAMILY HISTORY Both parents are alive and well. Patient has one sister who is 41 years younger and is in good health. No other history of breast or ovarian cancer in the family. No family history on file.  GYNECOLOGIC HISTORY:   (Updated January 2015) G2P2, menarche at age 25 with irregular menses. On birth control pills in the past. On Clomid to induce ovulation with first pregnancy. Also had preeclampsia with first pregnancy. Status post hysterectomy and bilateral salpingo-oophorectomy in August 2009.  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 She is originally from [place]. She's been married to Bethany, for 13 years. He works as an Pharmacist, hospital at [employer].   ADVANCED DIRECTIVES:    HEALTH MAINTENANCE:  (Updated 11/22/2013) History  Substance Use Topics  . Smoking status: Never Smoker   . Smokeless tobacco: Never Used  . Alcohol Use: Yes     Comment: rarely     Colonoscopy:  Never  PAP: S/P LAVH/BSO in August 2009  Bone density: Never  Lipid panel: Not on file   No Known Allergies  Current Outpatient Prescriptions  Medication Sig  Dispense Refill  . aspirin 81 MG tablet Take 81 mg by mouth daily.      . cetirizine (ZYRTEC) 10 MG tablet Take 10 mg by mouth daily.        . Cholecalciferol 10000 UNITS CAPS Take 1 capsule by mouth daily.      . Diphenhyd-Hydrocort-Nystatin (FIRST-DUKES MOUTHWASH) SUSP 5-10 ml QID PRN- swish -swallow and or spit  240 mL  3  . doxycycline (VIBRA-TABS) 100 MG tablet Take 1 tablet (100 mg total) by mouth daily.  30 tablet  3  . escitalopram (LEXAPRO) 20 MG tablet Take 20 mg by mouth daily with breakfast.       . LORazepam (ATIVAN) 0.5 MG tablet Take 1 mg by mouth every 4 (four) hours as needed for anxiety.      . methylPREDNISolone (MEDROL DOSEPAK) 4 MG tablet follow package directions  21 tablet  0  . Multiple Vitamin (MULTIVITAMIN) tablet Take 1 tablet by mouth daily.      Marland Kitchen omeprazole (PRILOSEC) 40 MG capsule Take 40 mg by mouth daily.       . palbociclib (IBRANCE) 125 MG capsule  Take whole with food 21 days then off x 7.  21 capsule  3   No current facility-administered medications for this visit.    OBJECTIVE: Young white woman in no acute distres Filed Vitals:   05/23/14 1512  BP: 141/85  Pulse: 76  Temp: 98.2 F (36.8 C)  Resp: 18  Body mass index is 33.7 kg/(m^2).  ECOG: 1 Filed Weights   05/23/14 1512  Weight: 196 lb 6.4 oz (89.086 kg)   Sclerae unicteric, EOMs intact Oropharynx clear and moist--left tympanic membrane is pearlywith a normal light reflex; the right tympanic membrane is obscured by at your wax Lungs no rales or rhonchi Heart regular rate and rhythm, no murmur appreciated Abd soft, nontender, positive bowel sounds, no masses palpated; the red patch is a nonpalpable minimally erythematous area in the lower right quadrant which I think is residual from her earlier everolimus skin irritation (she still has a little bit of a rash in her left forearm as well, from the same source). MSK no focal spinal tenderness, no upper extremity lymphedema Neuro: nonfocal, well  oriented, appropriate affect Breasts: Status post bilateral mastectomies. No evidence of local recurrence. Both axillae are benign   LAB RESULTS:  Lab Results  Component Value Date   WBC 6.9 05/23/2014   NEUTROABS 4.8 05/23/2014   HGB 13.7 05/23/2014   HCT 42.0 05/23/2014   MCV 90.1 05/23/2014   PLT 228 05/23/2014      Chemistry      Component Value Date/Time   NA 143 04/28/2014 1515   NA 142 11/12/2012 0735   K 3.4* 04/28/2014 1515   K 3.8 11/12/2012 0735   CL 104 11/14/2012 1407   CL 104 11/12/2012 0735   CO2 25 04/28/2014 1515   CO2 30 11/12/2012 0735   BUN 7.7 04/28/2014 1515   BUN 13 11/12/2012 0735   CREATININE 0.9 04/28/2014 1515   CREATININE 0.97 11/12/2012 0735      Component Value Date/Time   CALCIUM 9.2 04/28/2014 1515   CALCIUM 9.1 11/12/2012 0735   ALKPHOS 56 04/28/2014 1515   ALKPHOS 69 11/12/2012 0735   AST 27 04/28/2014 1515   AST 16 11/12/2012 0735   ALT 25 04/28/2014 1515   ALT 13 11/12/2012 0735   BILITOT 0.54 04/28/2014 1515   BILITOT 0.9 11/12/2012 0735      STUDIES: No results found.  ASSESSMENT: 48 y.o. Kristin Hoffman woman  (1)  status post left lumpectomy under the care of Dr. 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 2 of 23 lymph nodes were involved.  Margins were clear.  (2) status post adjuvant chemotherapy consisting of 6 q. three-week doses of docetaxel/doxorubicin/cyclophosphamide given between January 2007 and may of 2007, with last dose on 04/20/2006.  (3) status post radiation therapy to the left breast, completed 07/11/2006  (4) began on tamoxifen in early August 2007 and continued until October 2009. She status post hysterectomy with bilateral salpingo-oophorectomy on 07/15/2008, and was started on letrozole in October of 2009.  (5) mammogram 11/10/2009 showed microcalcifications in the left breast, and a subsequent biopsy on 11/18/2009 confirmed invasive  mammary carcinoma in the left breast. PET and CT of the chest showed no evidence of metastasis in January 2011.  (6) status post bilateral mastectomies 01/25/2010, with the right breast showing no evidence of malignancy; in the left breast showing a 1.0 cm grade 2 invasive ductal carcinoma with  high-grade DCIS. Tumor was ER +22%, PR negative, HER-2/neu negative, with proliferation marker of 13%. Margins were clear. Patient underwent concurrent left latissimus flap reconstruction and right implant reconstruction.  (7)  status post additional chemotherapy with one cycle of carboplatin/gemcitabine given on 03/11/2010. She then received 5 q. three-week cycles of IV CMF between 03/25/2010 and 06/17/2010.  (8) started on exemestane in January 2012 and continued until late November 2013 when she was hospitalized for apparent TIA at which time her exemestane was stopped and not resumed  (9) METASTATIC DISEASE: evidence of disease recurence noted on chest CT, liver MRI and PET scan in December 2014, with suspicious lung nodules, lymphadenopathy, and 3 lesions in the right lobe of the liver, but no evidence of bony disease and no evidence of brain involvement by brain MRI September 2014. Biopsy of a left supraclavicular lymph node on 12/11/2013 confirmed metastatic carcinoma, estrogen receptor 45% positive, progesterone receptor 17% positive, with no HER-2 amplification.  (10) fulvestrant started 12/03/2013, discontinued 03/25/2014 with progression  (11) exemestane/ everolimus started 04/04/2014; everolimus held 04/14/2014, with skin rash and mouth soreness; resumed at 5 mg a day as of 04/29/2014, discontinued 05/09/2014 with similar symptoms  (12) continueing exemestane, adding palbociclib 05/26/2014  (13) [facility] referral placed 04/28/2049 -- re-sent 05/23/2014   PLAN: Kristin Hoffman was not able to tolerate the everolimus even at reduced doses and therefore she is definitely off that medication. Today we talked  about Palbociclib and she has a good understanding of the possible toxicities, side effects and complications of that agent including the low blood counts issue, the possibility of pulmonary emboli, hair loss, and other side effects. I went ahead and wrote her the prescription and suggested she have it filled in our pharmacy, since this is a relatively difficult to get medications because of cost and the fact that it was only recently approved.  I have asked her to start 05/26/2014. We're going to start checking her lab work a week later and do weekly lab work for a month before going to every 2 weeks for a couple of months after which we will do it every 3 weeks, at the start of each cycle. She has a good understanding of the fact that she will take this drug for 3 weeks and then be off for 1 week, but that she will continue the exemestane right on through.  I am going to see her July 13. If she is tolerating the drug well we will obtain a repeat abdominal MRI at that time which will serve as our new baseline.  Kristin Hoffman has a good understanding of the overall plan. She agrees with it. She knows the goal of treatment in her case is control. She will call with any problems that may develop before her next visit here.  Chauncey Cruel, MD   05/23/2014 3:13 PM

## 2014-05-24 ENCOUNTER — Telehealth: Payer: Self-pay

## 2014-05-24 NOTE — Telephone Encounter (Signed)
Prescription for Ibrance sent to [facility] outpatient pharmacy 05/23/14.  Sent to scan.

## 2014-05-26 ENCOUNTER — Other Ambulatory Visit: Payer: Self-pay | Admitting: *Deleted

## 2014-05-26 ENCOUNTER — Encounter: Payer: Self-pay | Admitting: Oncology

## 2014-05-26 NOTE — Progress Notes (Signed)
Per Gaspar Bidding @ [facility] they can fill the ibrance this time but the patient has to use [facility]

## 2014-05-26 NOTE — Progress Notes (Signed)
Pt left message stating she was informed by [facility] Friday " that I would need to get the Ibrance thru my speciality pharmacy [facility]"  This RN spoke with Aaron Edelman at the [facility], who states they can dispense the 1st month ( will be available for pick up tomorrow ) with future fills to be obtained via pt's speciality pharmacy.  " this allows pt to start the medication asap "  This RN called to pt's home number and obtained identified VM- message left per above.  This RN will forward prescription for Ibrance to [facility].

## 2014-05-26 NOTE — Addendum Note (Signed)
Addended by: [name] on: 05/26/2014 06:03 PM   Modules accepted: Orders, Medications

## 2014-06-02 ENCOUNTER — Other Ambulatory Visit (HOSPITAL_BASED_OUTPATIENT_CLINIC_OR_DEPARTMENT_OTHER): Payer: BC Managed Care – PPO

## 2014-06-02 DIAGNOSIS — C50319 Malignant neoplasm of lower-inner quadrant of unspecified female breast: Secondary | ICD-10-CM

## 2014-06-02 DIAGNOSIS — C50312 Malignant neoplasm of lower-inner quadrant of left female breast: Secondary | ICD-10-CM

## 2014-06-02 DIAGNOSIS — G8929 Other chronic pain: Secondary | ICD-10-CM

## 2014-06-02 DIAGNOSIS — R51 Headache: Secondary | ICD-10-CM

## 2014-06-02 DIAGNOSIS — Z8673 Personal history of transient ischemic attack (TIA), and cerebral infarction without residual deficits: Secondary | ICD-10-CM

## 2014-06-02 DIAGNOSIS — C50919 Malignant neoplasm of unspecified site of unspecified female breast: Secondary | ICD-10-CM

## 2014-06-02 LAB — COMPREHENSIVE METABOLIC PANEL (CC13)
ALBUMIN: 3.8 g/dL (ref 3.5–5.0)
ALT: 10 U/L (ref 0–55)
ANION GAP: 7 meq/L (ref 3–11)
AST: 21 U/L (ref 5–34)
Alkaline Phosphatase: 61 U/L (ref 40–150)
BUN: 12.9 mg/dL (ref 7.0–26.0)
CO2: 28 meq/L (ref 22–29)
Calcium: 9.5 mg/dL (ref 8.4–10.4)
Chloride: 103 mEq/L (ref 98–109)
Creatinine: 1.1 mg/dL (ref 0.6–1.1)
GLUCOSE: 89 mg/dL (ref 70–140)
POTASSIUM: 4.4 meq/L (ref 3.5–5.1)
SODIUM: 139 meq/L (ref 136–145)
TOTAL PROTEIN: 6.9 g/dL (ref 6.4–8.3)
Total Bilirubin: 0.99 mg/dL (ref 0.20–1.20)

## 2014-06-02 LAB — CBC WITH DIFFERENTIAL/PLATELET
BASO%: 0.8 % (ref 0.0–2.0)
Basophils Absolute: 0 10*3/uL (ref 0.0–0.1)
EOS%: 1.6 % (ref 0.0–7.0)
Eosinophils Absolute: 0.1 10*3/uL (ref 0.0–0.5)
HCT: 38.8 % (ref 34.8–46.6)
HGB: 12.7 g/dL (ref 11.6–15.9)
LYMPH%: 30.3 % (ref 14.0–49.7)
MCH: 29.6 pg (ref 25.1–34.0)
MCHC: 32.7 g/dL (ref 31.5–36.0)
MCV: 90.6 fL (ref 79.5–101.0)
MONO#: 0.1 10*3/uL (ref 0.1–0.9)
MONO%: 3.6 % (ref 0.0–14.0)
NEUT#: 2.5 10*3/uL (ref 1.5–6.5)
NEUT%: 63.7 % (ref 38.4–76.8)
Platelets: 222 10*3/uL (ref 145–400)
RBC: 4.28 10*6/uL (ref 3.70–5.45)
RDW: 14 % (ref 11.2–14.5)
WBC: 4 10*3/uL (ref 3.9–10.3)
lymph#: 1.2 10*3/uL (ref 0.9–3.3)

## 2014-06-03 ENCOUNTER — Other Ambulatory Visit: Payer: Self-pay | Admitting: Physician Assistant

## 2014-06-09 ENCOUNTER — Other Ambulatory Visit (HOSPITAL_BASED_OUTPATIENT_CLINIC_OR_DEPARTMENT_OTHER): Payer: BC Managed Care – PPO

## 2014-06-09 DIAGNOSIS — Z8673 Personal history of transient ischemic attack (TIA), and cerebral infarction without residual deficits: Secondary | ICD-10-CM

## 2014-06-09 DIAGNOSIS — R51 Headache: Secondary | ICD-10-CM

## 2014-06-09 DIAGNOSIS — G8929 Other chronic pain: Secondary | ICD-10-CM

## 2014-06-09 DIAGNOSIS — C50312 Malignant neoplasm of lower-inner quadrant of left female breast: Secondary | ICD-10-CM

## 2014-06-09 DIAGNOSIS — C50919 Malignant neoplasm of unspecified site of unspecified female breast: Secondary | ICD-10-CM

## 2014-06-09 DIAGNOSIS — C50319 Malignant neoplasm of lower-inner quadrant of unspecified female breast: Secondary | ICD-10-CM

## 2014-06-09 LAB — CBC WITH DIFFERENTIAL/PLATELET
BASO%: 0.4 % (ref 0.0–2.0)
Basophils Absolute: 0 10*3/uL (ref 0.0–0.1)
EOS%: 1.8 % (ref 0.0–7.0)
Eosinophils Absolute: 0 10*3/uL (ref 0.0–0.5)
HCT: 37.9 % (ref 34.8–46.6)
HGB: 12.2 g/dL (ref 11.6–15.9)
LYMPH#: 1 10*3/uL (ref 0.9–3.3)
LYMPH%: 44.8 % (ref 14.0–49.7)
MCH: 29.6 pg (ref 25.1–34.0)
MCHC: 32.2 g/dL (ref 31.5–36.0)
MCV: 92 fL (ref 79.5–101.0)
MONO#: 0.1 10*3/uL (ref 0.1–0.9)
MONO%: 2.7 % (ref 0.0–14.0)
NEUT#: 1.1 10*3/uL — ABNORMAL LOW (ref 1.5–6.5)
NEUT%: 50.3 % (ref 38.4–76.8)
Platelets: 155 10*3/uL (ref 145–400)
RBC: 4.12 10*6/uL (ref 3.70–5.45)
RDW: 14.4 % (ref 11.2–14.5)
WBC: 2.2 10*3/uL — AB (ref 3.9–10.3)

## 2014-06-16 ENCOUNTER — Telehealth: Payer: Self-pay

## 2014-06-16 ENCOUNTER — Other Ambulatory Visit (HOSPITAL_BASED_OUTPATIENT_CLINIC_OR_DEPARTMENT_OTHER): Payer: BC Managed Care – PPO

## 2014-06-16 DIAGNOSIS — C50319 Malignant neoplasm of lower-inner quadrant of unspecified female breast: Secondary | ICD-10-CM

## 2014-06-16 DIAGNOSIS — C50919 Malignant neoplasm of unspecified site of unspecified female breast: Secondary | ICD-10-CM

## 2014-06-16 DIAGNOSIS — G8929 Other chronic pain: Secondary | ICD-10-CM

## 2014-06-16 DIAGNOSIS — R51 Headache: Secondary | ICD-10-CM

## 2014-06-16 DIAGNOSIS — C50312 Malignant neoplasm of lower-inner quadrant of left female breast: Secondary | ICD-10-CM

## 2014-06-16 DIAGNOSIS — Z8673 Personal history of transient ischemic attack (TIA), and cerebral infarction without residual deficits: Secondary | ICD-10-CM

## 2014-06-16 LAB — CBC WITH DIFFERENTIAL/PLATELET
BASO%: 1.1 % (ref 0.0–2.0)
Basophils Absolute: 0 10*3/uL (ref 0.0–0.1)
EOS%: 1.1 % (ref 0.0–7.0)
Eosinophils Absolute: 0 10*3/uL (ref 0.0–0.5)
HEMATOCRIT: 37.3 % (ref 34.8–46.6)
HGB: 12.3 g/dL (ref 11.6–15.9)
LYMPH#: 0.8 10*3/uL — AB (ref 0.9–3.3)
LYMPH%: 45.6 % (ref 14.0–49.7)
MCH: 30.8 pg (ref 25.1–34.0)
MCHC: 33 g/dL (ref 31.5–36.0)
MCV: 93.3 fL (ref 79.5–101.0)
MONO#: 0.1 10*3/uL (ref 0.1–0.9)
MONO%: 4.4 % (ref 0.0–14.0)
NEUT#: 0.9 10*3/uL — ABNORMAL LOW (ref 1.5–6.5)
NEUT%: 47.8 % (ref 38.4–76.8)
Platelets: 109 10*3/uL — ABNORMAL LOW (ref 145–400)
RBC: 4 10*6/uL (ref 3.70–5.45)
RDW: 15.4 % — ABNORMAL HIGH (ref 11.2–14.5)
WBC: 1.8 10*3/uL — AB (ref 3.9–10.3)

## 2014-06-16 NOTE — Telephone Encounter (Signed)
Pt called requesting call back ONLY if labs require any additional action on her part as she is going out of town.  Labs reviewed - no call back necessary.

## 2014-06-23 ENCOUNTER — Other Ambulatory Visit (HOSPITAL_BASED_OUTPATIENT_CLINIC_OR_DEPARTMENT_OTHER): Payer: BC Managed Care – PPO

## 2014-06-23 ENCOUNTER — Ambulatory Visit (HOSPITAL_BASED_OUTPATIENT_CLINIC_OR_DEPARTMENT_OTHER): Payer: BC Managed Care – PPO | Admitting: Oncology

## 2014-06-23 ENCOUNTER — Other Ambulatory Visit: Payer: BC Managed Care – PPO

## 2014-06-23 ENCOUNTER — Telehealth: Payer: Self-pay | Admitting: Oncology

## 2014-06-23 VITALS — BP 123/85 | HR 77 | Temp 98.6°F | Resp 18 | Ht 64.0 in | Wt 193.9 lb

## 2014-06-23 DIAGNOSIS — R5381 Other malaise: Secondary | ICD-10-CM

## 2014-06-23 DIAGNOSIS — C770 Secondary and unspecified malignant neoplasm of lymph nodes of head, face and neck: Secondary | ICD-10-CM

## 2014-06-23 DIAGNOSIS — C50312 Malignant neoplasm of lower-inner quadrant of left female breast: Secondary | ICD-10-CM

## 2014-06-23 DIAGNOSIS — R51 Headache: Secondary | ICD-10-CM

## 2014-06-23 DIAGNOSIS — G8929 Other chronic pain: Secondary | ICD-10-CM

## 2014-06-23 DIAGNOSIS — C50919 Malignant neoplasm of unspecified site of unspecified female breast: Secondary | ICD-10-CM

## 2014-06-23 DIAGNOSIS — C50319 Malignant neoplasm of lower-inner quadrant of unspecified female breast: Secondary | ICD-10-CM

## 2014-06-23 DIAGNOSIS — Z8673 Personal history of transient ischemic attack (TIA), and cerebral infarction without residual deficits: Secondary | ICD-10-CM

## 2014-06-23 DIAGNOSIS — D702 Other drug-induced agranulocytosis: Secondary | ICD-10-CM

## 2014-06-23 DIAGNOSIS — R5383 Other fatigue: Secondary | ICD-10-CM

## 2014-06-23 LAB — CBC WITH DIFFERENTIAL/PLATELET
BASO%: 1.5 % (ref 0.0–2.0)
Basophils Absolute: 0 10*3/uL (ref 0.0–0.1)
EOS%: 0.6 % (ref 0.0–7.0)
Eosinophils Absolute: 0 10*3/uL (ref 0.0–0.5)
HCT: 40.8 % (ref 34.8–46.6)
HGB: 13.2 g/dL (ref 11.6–15.9)
LYMPH%: 50.1 % — ABNORMAL HIGH (ref 14.0–49.7)
MCH: 30.6 pg (ref 25.1–34.0)
MCHC: 32.3 g/dL (ref 31.5–36.0)
MCV: 94.7 fL (ref 79.5–101.0)
MONO#: 0.3 10*3/uL (ref 0.1–0.9)
MONO%: 10.6 % (ref 0.0–14.0)
NEUT%: 37.2 % — ABNORMAL LOW (ref 38.4–76.8)
NEUTROS ABS: 0.9 10*3/uL — AB (ref 1.5–6.5)
Platelets: 228 10*3/uL (ref 145–400)
RBC: 4.31 10*6/uL (ref 3.70–5.45)
RDW: 17.8 % — AB (ref 11.2–14.5)
WBC: 2.5 10*3/uL — AB (ref 3.9–10.3)
lymph#: 1.2 10*3/uL (ref 0.9–3.3)

## 2014-06-23 LAB — COMPREHENSIVE METABOLIC PANEL (CC13)
ALK PHOS: 50 U/L (ref 40–150)
ALT: 16 U/L (ref 0–55)
AST: 22 U/L (ref 5–34)
Albumin: 4.1 g/dL (ref 3.5–5.0)
Anion Gap: 9 mEq/L (ref 3–11)
BUN: 11.1 mg/dL (ref 7.0–26.0)
CO2: 27 meq/L (ref 22–29)
Calcium: 9.6 mg/dL (ref 8.4–10.4)
Chloride: 105 mEq/L (ref 98–109)
Creatinine: 0.9 mg/dL (ref 0.6–1.1)
GLUCOSE: 83 mg/dL (ref 70–140)
POTASSIUM: 4.1 meq/L (ref 3.5–5.1)
SODIUM: 141 meq/L (ref 136–145)
Total Bilirubin: 0.67 mg/dL (ref 0.20–1.20)
Total Protein: 7.1 g/dL (ref 6.4–8.3)

## 2014-06-23 MED ORDER — PALBOCICLIB 100 MG PO CAPS: ORAL_CAPSULE | ORAL | Status: DC

## 2014-06-23 NOTE — Progress Notes (Signed)
ID: Kristin Hoffman OB: [birth year]  MR#: [id number]  FBP#:[id number]  PCP: Drake Leach, MD GYN:  [name] SU: [name] OTHER MD: [name]  CHIEF COMPLAINT:  Stage IV breast cancer CURRENT TREATMENT: exemestane and palbociclib  HISTORY OF PRESENT ILLNESS: This patient was previously followed by [name], and was transferred to [name] service as of 08/20/2013.  At the age of 48, the patient had a screening mammogram as a baseline. She had recently given birth and was on birth control pills at that time. The mammogram in November 2006 showed an area of architectural distortion in the left lower inner quadrant. Subsequently an ultrasound was obtained of the left breast showing a 2.0 x 1.5 x 1.4 cm mass, at the 8:00 position, 4 cm from the nipple. A core biopsy performed on 10/21/2005 showed an invasive mammary carcinoma, ER +70%, PR +41%, HER-2/neu negative, with proliferation marker of 38%. [phone])  Breast MRI on 11/08/2005 confirmed a 2.5 cm spiculated mass in the left lower inner quadrant with 3 small satellite nodules adjacent to the primary mass: 3.5 cm posterior medial to the primary mass, measuring 9 mm; 1.5 cm anterior medial to the primary mass measuring 5 mm; and 2 cm medial to the primary mass measuring 5 mm. No axillary adenopathy was noted. No suspicious masses or enhancement are noted in the right breast.  Kristin Hoffman underwentt left lumpectomy under the care of Dr. 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 2 of 23 lymph nodes were involved.  Margins were clear.  She received adjuvant chemotherapy consisting of 6 q. three-week doses of docetaxel/doxorubicin/cyclophosphamide given between January 2007 and may of 2007, with last dose on 04/20/2006. She underwent radiation therapy completed 07/11/2006, after which she began on tamoxifen in early August 2007.  She  underwent hysterectomy with bilateral salpingo-oophorectomy on 07/15/2008, and was started on letrozole in October of 2009.  A mammogram 11/10/2009 showed microcalcifications in the left breast, and a subsequent biopsy on 11/18/2009 confirmed invasive mammary carcinoma in the left breast. PET and CT of the chest showed no evidence of metastasis in January 2011.  She underwent bilateral mastectomies 01/25/2010, with the right breast showing no evidence of malignancy; in the left breast showing a 1.0 cm grade 2 invasive ductal carcinoma with high-grade DCIS. Tumor was ER +22%, PR negative, HER-2/neu negative, with proliferation marker of 13%. Margins were clear. Patient underwent concurrent left latissimus flap reconstruction and right implant reconstruction.  She received additional chemotherapy with one cycle of carboplatin/gemcitabine given on 03/11/2010. She then received 5 q. three-week cycles of IV CMF between 03/25/2010 and 06/17/2010.  She was started on exemestane in January 2012 and continued until late November 2013 when she was hospitalized for apparent TIA.   Her subsequent history is as detailed below  INTERVAL HISTORY: Kristin Hoffman returns today for followup of her metastatic breast cancer. Today is day 1 cycle 2 of her exemestane/Palbociclib therapy. More specifically, she has been off Palbociclib for the last week, and is set to start again today. The exemestane of courses continued without interruption.  REVIEW OF SYSTEMS: Kristin Hoffman is tolerating treatment well. She has rare hot flashes with exemestane, which do not wake her up at night. She has noted a mild fatigue from the Palbociclib. She really only notices after going off it, when she had a little bit more energy. Currently she has a little bit of a  sore throat and some hoarseness, which she attributes to seasonal allergies. She went to [place] on a vacation trip and enjoyed that. The only other concern was the salts "spots" in the right  lower quadrant, which was more a pimple than anything else. She "squeezed in", and there was some local inflammation. She wanted me to make sure to evaluate that today. A detailed review of systems was otherwise noncontributory  PAST MEDICAL HISTORY: Past Medical History  Diagnosis Date  . Breast cancer   . Depression   . Reflux   . Cancer   . Hx-TIA (transient ischemic attack) 11/22/2013  . Chronic headaches 11/22/2013  . Anxiety 11/22/2013  . Breast cancer metastasized to multiple sites 12/24/2013    PAST SURGICAL HISTORY: Past Surgical History  Procedure Laterality Date  . Mastectomy w/ nodes partial    . Mastectomy    . Breast reconstruction    . Portacath placement      x 2  . Portacath removal      x 2  . Abdominal hysterectomy    . Tee without cardioversion  11/12/2012    Procedure: TRANSESOPHAGEAL ECHOCARDIOGRAM (TEE);  Surgeon: Candee Furbish, MD;  Location: [facility] ENDOSCOPY;  Service: Cardiovascular;  Laterality: N/A;  This TEE may be Dr. Marlou Porch or a LHC Dr    FAMILY HISTORY Both parents are alive and well. Patient has one sister who is 48 years younger and is in good health. No other history of breast or ovarian cancer in the family. No family history on file.  GYNECOLOGIC HISTORY:   (Updated January 2015) G2P2, menarche at age 3 with irregular menses. On birth control pills in the past. On Clomid to induce ovulation with first pregnancy. Also had preeclampsia with first pregnancy. Status post hysterectomy and bilateral salpingo-oophorectomy in August 2009.  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 She is originally from [place]. She's been married to [place], for 13 years. He works as an Pharmacist, hospital at [employer].   ADVANCED DIRECTIVES:    HEALTH MAINTENANCE:  (Updated 11/22/2013) History  Substance Use Topics  . Smoking status: Never Smoker   . Smokeless tobacco:  Never Used  . Alcohol Use: Yes     Comment: rarely     Colonoscopy:  Never  PAP: S/P LAVH/BSO in August 2009  Bone density: Never  Lipid panel: Not on file   No Known Allergies  Current Outpatient Prescriptions  Medication Sig Dispense Refill  . aspirin 81 MG tablet Take 81 mg by mouth daily.      . cetirizine (ZYRTEC) 10 MG tablet Take 10 mg by mouth daily.        . Cholecalciferol 10000 UNITS CAPS Take 1 capsule by mouth daily.      Marland Kitchen escitalopram (LEXAPRO) 20 MG tablet Take 20 mg by mouth daily with breakfast.       . exemestane (AROMASIN) 25 MG tablet       . LORazepam (ATIVAN) 0.5 MG tablet Take 1 mg by mouth every 4 (four) hours as needed for anxiety.      . Multiple Vitamin (MULTIVITAMIN) tablet Take 1 tablet by mouth daily.      Marland Kitchen omeprazole (PRILOSEC) 40 MG capsule Take 40 mg by mouth daily.       . palbociclib (IBRANCE) 125 MG capsule Take whole with food 21 days then off x 7.  21 capsule  3  . zolpidem (AMBIEN) 10 MG tablet        No current facility-administered medications for this visit.    OBJECTIVE: Young white woman who appears stated age 57 Vitals:   06/23/14 1254  BP: 123/85  Pulse: 77  Temp: 98.6 F (37 C)  Resp: 18  Body mass index is 33.27 kg/(m^2).  ECOG: 1 Filed Weights   06/23/14 1254  Weight: 193 lb 14.4 oz (87.952 kg)   Sclerae unicteric, pupils equal and reactive Oropharynx clear and moist No cervical or supraclavicular adenopathy Lungs no rales or rhonchi Heart regular rate and rhythm Abd soft, nontender, positive bowel sounds MSK no focal spinal tenderness, no upper extremity lymphedema Neuro: nonfocal, well oriented, positive affect Breasts: Status post bilateral mastectomies. No evidence of local recurrence. Both axillae are benign Skin: The lesion in the right upper quadrant has completely healed. There is no evidence of inflammation. There is no palpable finding  LAB RESULTS: Results for Kristin Hoffman, Kristin Hoffman (MRN [id number]) as of  06/23/2014 20:08  Ref. Range 05/23/2014 15:07 06/02/2014 13:40 06/09/2014 11:20 06/16/2014 08:17 06/23/2014 12:39  NEUT# Latest Range: 1.7-7.7 K/uL 4.8 2.5 1.1 (L) 0.9 (L) 0.9 (L)   Lab Results  Component Value Date   WBC 2.5* 06/23/2014   NEUTROABS 0.9* 06/23/2014   HGB 13.2 06/23/2014   HCT 40.8 06/23/2014   MCV 94.7 06/23/2014   PLT 228 06/23/2014      Chemistry      Component Value Date/Time   NA 139 06/02/2014 1341   NA 142 11/12/2012 0735   K 4.4 06/02/2014 1341   K 3.8 11/12/2012 0735   CL 104 11/14/2012 1407   CL 104 11/12/2012 0735   CO2 28 06/02/2014 1341   CO2 30 11/12/2012 0735   BUN 12.9 06/02/2014 1341   BUN 13 11/12/2012 0735   CREATININE 1.1 06/02/2014 1341   CREATININE 0.97 11/12/2012 0735      Component Value Date/Time   CALCIUM 9.5 06/02/2014 1341   CALCIUM 9.1 11/12/2012 0735   ALKPHOS 61 06/02/2014 1341   ALKPHOS 69 11/12/2012 0735   AST 21 06/02/2014 1341   AST 16 11/12/2012 0735   ALT 10 06/02/2014 1341   ALT 13 11/12/2012 0735   BILITOT 0.99 06/02/2014 1341   BILITOT 0.9 11/12/2012 0735      STUDIES: No results found.  ASSESSMENT: 48 y.o. [place] woman  (1)  status post left lumpectomy under the care of Dr. 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 2 of 23 lymph nodes were involved.  Margins were clear.  (2) status post adjuvant chemotherapy consisting of 6 q. three-week doses of docetaxel/doxorubicin/cyclophosphamide given between January 2007 and may of 2007, with last dose on 04/20/2006.  (3) status post radiation therapy to the left breast, completed 07/11/2006  (4) began on tamoxifen in early August 2007 and continued until October 2009. She status post hysterectomy with bilateral salpingo-oophorectomy on 07/15/2008, and was started on letrozole in October of 2009.  (5) mammogram 11/10/2009 showed microcalcifications in the left breast, and a subsequent biopsy on  11/18/2009 confirmed invasive mammary carcinoma in the left breast. PET and CT of the chest showed no evidence of metastasis in January 2011.  (6) status post bilateral mastectomies 01/25/2010, with the right breast showing no evidence of malignancy; in the left breast showing a 1.0 cm grade 2 invasive ductal carcinoma with high-grade DCIS. Tumor  was ER +22%, PR negative, HER-2/neu negative, with proliferation marker of 13%. Margins were clear. Patient underwent concurrent left latissimus flap reconstruction and right implant reconstruction.  (7)  status post additional chemotherapy with one cycle of carboplatin/gemcitabine given on 03/11/2010. She then received 5 q. three-week cycles of IV CMF between 03/25/2010 and 06/17/2010.  (8) started on exemestane in January 2012 and continued until late November 2013 when she was hospitalized for apparent TIA at which time her exemestane was stopped and not resumed  (9) METASTATIC DISEASE: evidence of disease recurence noted on chest CT, liver MRI and PET scan in December 2014, with suspicious lung nodules, lymphadenopathy, and 3 lesions in the right lobe of the liver, but no evidence of bony disease and no evidence of brain involvement by brain MRI September 2014. Biopsy of a left supraclavicular lymph node on 12/11/2013 confirmed metastatic carcinoma, estrogen receptor 45% positive, progesterone receptor 17% positive, with no HER-2 amplification.  (10) fulvestrant started 12/03/2013, discontinued 03/25/2014 with progression  (11) exemestane/ everolimus started 04/04/2014; everolimus held 04/14/2014, with skin rash and mouth soreness; resumed at 5 mg a day as of 04/29/2014, discontinued 05/09/2014 with similar symptoms  (12) continueing exemestane, adding palbociclib 05/26/2014;   (a) Abraxane dose dropped to 100 mg daily for 21 days begin with cycle 2  (13) [facility] referral placed 04/28/2049 -- re-sent 05/23/2014   PLAN: Kristin Hoffman is doing well as far as  her breast cancer is concerned. We're going to continue the exemestane but will drop to the Palbociclib dose to the next level, namely 100 mg, starting with cycle 2, because of a developing neutropenia. I am also delaying that cycle at least one week to give her counts a little time to recover. She will see me again in early August and we will decide at that time whether or not she may need further dose reductions.  The patient is a good understanding of the overall plan. She agrees with it. She knows the goal of treatment in her case is control. She will call with any problems that may develop before her next visit here. Chauncey Cruel, MD   06/23/2014 1:31 PM

## 2014-06-23 NOTE — Telephone Encounter (Signed)
gv pt appt schedule for july/aug °

## 2014-06-24 ENCOUNTER — Other Ambulatory Visit: Payer: Self-pay | Admitting: *Deleted

## 2014-06-24 NOTE — Telephone Encounter (Signed)
This RN contacted pt's mail order pharmacy to discuss change in dose of medication as well as pt had shipment of previous dose sent to her already. Pt has not opened new dose and inquiry is being made if pt can return for credit.  This RN was informed that pt would need to initiate above request.  This RN called and informed patient.

## 2014-06-24 NOTE — Addendum Note (Signed)
Addended by: Amelia Jo I on: 06/24/2014 10:30 AM   Modules accepted: Orders, Medications

## 2014-06-30 ENCOUNTER — Other Ambulatory Visit (HOSPITAL_BASED_OUTPATIENT_CLINIC_OR_DEPARTMENT_OTHER): Payer: BC Managed Care – PPO

## 2014-06-30 ENCOUNTER — Other Ambulatory Visit: Payer: Self-pay | Admitting: *Deleted

## 2014-06-30 DIAGNOSIS — C50319 Malignant neoplasm of lower-inner quadrant of unspecified female breast: Secondary | ICD-10-CM

## 2014-06-30 DIAGNOSIS — C50312 Malignant neoplasm of lower-inner quadrant of left female breast: Secondary | ICD-10-CM

## 2014-06-30 DIAGNOSIS — R519 Headache, unspecified: Secondary | ICD-10-CM

## 2014-06-30 DIAGNOSIS — C50919 Malignant neoplasm of unspecified site of unspecified female breast: Secondary | ICD-10-CM

## 2014-06-30 DIAGNOSIS — Z8673 Personal history of transient ischemic attack (TIA), and cerebral infarction without residual deficits: Secondary | ICD-10-CM

## 2014-06-30 DIAGNOSIS — R51 Headache: Secondary | ICD-10-CM

## 2014-06-30 LAB — CBC WITH DIFFERENTIAL/PLATELET
BASO%: 1.9 % (ref 0.0–2.0)
Basophils Absolute: 0.1 10*3/uL (ref 0.0–0.1)
EOS%: 0.9 % (ref 0.0–7.0)
Eosinophils Absolute: 0 10*3/uL (ref 0.0–0.5)
HCT: 40.2 % (ref 34.8–46.6)
HGB: 13.3 g/dL (ref 11.6–15.9)
LYMPH%: 38.2 % (ref 14.0–49.7)
MCH: 30.9 pg (ref 25.1–34.0)
MCHC: 33.1 g/dL (ref 31.5–36.0)
MCV: 93.5 fL (ref 79.5–101.0)
MONO#: 0.3 10*3/uL (ref 0.1–0.9)
MONO%: 10.7 % (ref 0.0–14.0)
NEUT#: 1.5 10*3/uL (ref 1.5–6.5)
NEUT%: 48.3 % (ref 38.4–76.8)
Platelets: 261 10*3/uL (ref 145–400)
RBC: 4.3 10*6/uL (ref 3.70–5.45)
RDW: 16.5 % — ABNORMAL HIGH (ref 11.2–14.5)
WBC: 3.2 10*3/uL — AB (ref 3.9–10.3)
lymph#: 1.2 10*3/uL (ref 0.9–3.3)

## 2014-07-07 ENCOUNTER — Other Ambulatory Visit (HOSPITAL_BASED_OUTPATIENT_CLINIC_OR_DEPARTMENT_OTHER): Payer: BC Managed Care – PPO

## 2014-07-07 DIAGNOSIS — R51 Headache: Secondary | ICD-10-CM

## 2014-07-07 DIAGNOSIS — C50919 Malignant neoplasm of unspecified site of unspecified female breast: Secondary | ICD-10-CM

## 2014-07-07 DIAGNOSIS — Z8673 Personal history of transient ischemic attack (TIA), and cerebral infarction without residual deficits: Secondary | ICD-10-CM

## 2014-07-07 DIAGNOSIS — R519 Headache, unspecified: Secondary | ICD-10-CM

## 2014-07-07 DIAGNOSIS — C50312 Malignant neoplasm of lower-inner quadrant of left female breast: Secondary | ICD-10-CM

## 2014-07-07 DIAGNOSIS — C50319 Malignant neoplasm of lower-inner quadrant of unspecified female breast: Secondary | ICD-10-CM

## 2014-07-07 LAB — CBC WITH DIFFERENTIAL/PLATELET
BASO%: 1.4 % (ref 0.0–2.0)
Basophils Absolute: 0.1 10*3/uL (ref 0.0–0.1)
EOS%: 1.6 % (ref 0.0–7.0)
Eosinophils Absolute: 0.1 10*3/uL (ref 0.0–0.5)
HEMATOCRIT: 41.4 % (ref 34.8–46.6)
HGB: 13.4 g/dL (ref 11.6–15.9)
LYMPH#: 1.4 10*3/uL (ref 0.9–3.3)
LYMPH%: 31.1 % (ref 14.0–49.7)
MCH: 30.4 pg (ref 25.1–34.0)
MCHC: 32.4 g/dL (ref 31.5–36.0)
MCV: 93.6 fL (ref 79.5–101.0)
MONO#: 0.3 10*3/uL (ref 0.1–0.9)
MONO%: 7.1 % (ref 0.0–14.0)
NEUT#: 2.7 10*3/uL (ref 1.5–6.5)
NEUT%: 58.8 % (ref 38.4–76.8)
Platelets: 287 10*3/uL (ref 145–400)
RBC: 4.42 10*6/uL (ref 3.70–5.45)
RDW: 17.7 % — ABNORMAL HIGH (ref 11.2–14.5)
WBC: 4.6 10*3/uL (ref 3.9–10.3)

## 2014-07-07 LAB — COMPREHENSIVE METABOLIC PANEL (CC13)
ALT: 17 U/L (ref 0–55)
AST: 25 U/L (ref 5–34)
Albumin: 4.1 g/dL (ref 3.5–5.0)
Alkaline Phosphatase: 54 U/L (ref 40–150)
Anion Gap: 8 mEq/L (ref 3–11)
BUN: 12.8 mg/dL (ref 7.0–26.0)
CALCIUM: 9.6 mg/dL (ref 8.4–10.4)
CHLORIDE: 105 meq/L (ref 98–109)
CO2: 28 meq/L (ref 22–29)
Creatinine: 1 mg/dL (ref 0.6–1.1)
Glucose: 93 mg/dl (ref 70–140)
Potassium: 3.6 mEq/L (ref 3.5–5.1)
Sodium: 142 mEq/L (ref 136–145)
Total Bilirubin: 0.65 mg/dL (ref 0.20–1.20)
Total Protein: 7.1 g/dL (ref 6.4–8.3)

## 2014-07-14 ENCOUNTER — Other Ambulatory Visit (HOSPITAL_BASED_OUTPATIENT_CLINIC_OR_DEPARTMENT_OTHER): Payer: BC Managed Care – PPO

## 2014-07-14 ENCOUNTER — Telehealth: Payer: Self-pay | Admitting: Oncology

## 2014-07-14 ENCOUNTER — Ambulatory Visit (HOSPITAL_BASED_OUTPATIENT_CLINIC_OR_DEPARTMENT_OTHER): Payer: BC Managed Care – PPO | Admitting: Oncology

## 2014-07-14 VITALS — BP 133/81 | HR 80 | Temp 99.0°F | Resp 18 | Ht 64.0 in | Wt 195.0 lb

## 2014-07-14 DIAGNOSIS — Z8673 Personal history of transient ischemic attack (TIA), and cerebral infarction without residual deficits: Secondary | ICD-10-CM

## 2014-07-14 DIAGNOSIS — C50312 Malignant neoplasm of lower-inner quadrant of left female breast: Secondary | ICD-10-CM

## 2014-07-14 DIAGNOSIS — R51 Headache: Secondary | ICD-10-CM

## 2014-07-14 DIAGNOSIS — C50319 Malignant neoplasm of lower-inner quadrant of unspecified female breast: Secondary | ICD-10-CM

## 2014-07-14 DIAGNOSIS — G8929 Other chronic pain: Secondary | ICD-10-CM

## 2014-07-14 DIAGNOSIS — C50912 Malignant neoplasm of unspecified site of left female breast: Secondary | ICD-10-CM

## 2014-07-14 DIAGNOSIS — C50919 Malignant neoplasm of unspecified site of unspecified female breast: Secondary | ICD-10-CM

## 2014-07-14 DIAGNOSIS — C770 Secondary and unspecified malignant neoplasm of lymph nodes of head, face and neck: Secondary | ICD-10-CM

## 2014-07-14 LAB — CBC WITH DIFFERENTIAL/PLATELET
BASO%: 1.1 % (ref 0.0–2.0)
Basophils Absolute: 0 10*3/uL (ref 0.0–0.1)
EOS%: 2.8 % (ref 0.0–7.0)
Eosinophils Absolute: 0.1 10*3/uL (ref 0.0–0.5)
HCT: 40.1 % (ref 34.8–46.6)
HGB: 13.2 g/dL (ref 11.6–15.9)
LYMPH#: 0.7 10*3/uL — AB (ref 0.9–3.3)
LYMPH%: 16.3 % (ref 14.0–49.7)
MCH: 31 pg (ref 25.1–34.0)
MCHC: 33 g/dL (ref 31.5–36.0)
MCV: 94 fL (ref 79.5–101.0)
MONO#: 0.2 10*3/uL (ref 0.1–0.9)
MONO%: 3.4 % (ref 0.0–14.0)
NEUT#: 3.4 10*3/uL (ref 1.5–6.5)
NEUT%: 76.4 % (ref 38.4–76.8)
Platelets: 227 10*3/uL (ref 145–400)
RBC: 4.26 10*6/uL (ref 3.70–5.45)
RDW: 17.4 % — AB (ref 11.2–14.5)
WBC: 4.5 10*3/uL (ref 3.9–10.3)

## 2014-07-14 NOTE — Progress Notes (Signed)
ID: Kristin Hoffman OB: 06-23-1966  MR#: [id number]  ZNB#:[id number]  PCP: Kristin Leach, MD GYN:  Kristin Hoffman SU: Kristin Hoffman OTHER MD: Kristin Hoffman  CHIEF COMPLAINT:  Stage IV breast cancer CURRENT TREATMENT: exemestane and palbociclib  BREAST CANCER HISTORY: This patient was previously followed by Dr. Eston Hoffman, and was transferred to Dr. Virgie Hoffman service as of 08/20/2013.  At the age of 13, the patient had a screening mammogram as a baseline. She had recently given birth and was on birth control pills at that time. The mammogram in November 2006 showed an area of architectural distortion in the left lower inner quadrant. Subsequently an ultrasound was obtained of the left breast showing a 2.0 x 1.5 x 1.4 cm mass, at the 8:00 position, 4 cm from the nipple. A core biopsy performed on 10/21/2005 showed an invasive mammary carcinoma, ER +70%, PR +41%, HER-2/neu negative, with proliferation marker of 38%. [phone])  Breast MRI on 11/08/2005 confirmed a 2.5 cm spiculated mass in the left lower inner quadrant with 3 small satellite nodules adjacent to the primary mass: 3.5 cm posterior medial to the primary mass, measuring 9 mm; 1.5 cm anterior medial to the primary mass measuring 5 mm; and 2 cm medial to the primary mass measuring 5 mm. No axillary adenopathy was noted. No suspicious masses or enhancement are noted in the right breast.  Kristin Hoffman underwentt left lumpectomy under the care of Dr. Margot Hoffman on 11/18/2005 for a 2.5 cm grade 3 invasive ductal carcinoma, ER +70%, PR +41%, HER-2/neu negative, with proliferation marker of 38%. 2 of 23 lymph nodes were involved.  Margins were clear.  She received adjuvant chemotherapy consisting of 6 q. three-week doses of docetaxel/doxorubicin/cyclophosphamide given between January 2007 and may of 2007, with last dose on 04/20/2006. She Hoffman radiation therapy completed 07/11/2006, after which she began on tamoxifen in early August 2007.  She  Hoffman hysterectomy with bilateral salpingo-oophorectomy on 07/15/2008, and was started on letrozole in October of 2009.  A mammogram 11/10/2009 showed microcalcifications in the left breast, and a subsequent biopsy on 11/18/2009 confirmed invasive mammary carcinoma in the left breast. PET and CT of the chest showed no evidence of metastasis in January 2011.  She Hoffman bilateral mastectomies 01/25/2010, with the right breast showing no evidence of malignancy; in the left breast showing a 1.0 cm grade 2 invasive ductal carcinoma with high-grade DCIS. Tumor was ER +22%, PR negative, HER-2/neu negative, with proliferation marker of 13%. Margins were clear. Patient Hoffman concurrent left latissimus flap reconstruction and right implant reconstruction.  She received additional chemotherapy with one cycle of carboplatin/gemcitabine given on 03/11/2010. She then received 5 q. three-week cycles of IV CMF between 03/25/2010 and 06/17/2010.  She was started on exemestane in January 2012 and continued until late November 2013 when she was hospitalized for apparent TIA.   Her subsequent history is as detailed below  INTERVAL HISTORY: [name] returns today for followup of her metastatic breast cancer accompanied by her mother. Today is day 8 cycle 2 of her exemestane/Palbociclib therapy. More specifically, she started Palbociclib at a lower dose, 100 mg daily, a week ago.  REVIEW OF SYSTEMS: [name] is tolerating both pills well. As far as the exemestane is concerned she is a rare hot flashes which don't wake her up at night are not a big concern, and no problems with vaginal dryness. From the Palbociclib she has no symptoms except of course her earlier it did drop her counts. She had no intercurrent infections. Currently  she has a mild runny nose, some sinus symptoms, and a little bit of a sore throat. She had a temperature of 99 this morning. A separate concern is the insect bite or otherwise skin change in  the right lower quadrant of her abdomen (described below. Aside from this a detailed review of systems today was noncontributory  PAST MEDICAL HISTORY: Past Medical History  Diagnosis Date  . Breast cancer   . Depression   . Reflux   . Cancer   . Hx-TIA (transient ischemic attack) 11/22/2013  . Chronic headaches 11/22/2013  . Anxiety 11/22/2013  . Breast cancer metastasized to multiple sites 12/24/2013    PAST SURGICAL HISTORY: Past Surgical History  Procedure Laterality Date  . Mastectomy w/ nodes partial    . Mastectomy    . Breast reconstruction    . Portacath placement      x 2  . Portacath removal      x 2  . Abdominal hysterectomy    . Tee without cardioversion  11/12/2012    Procedure: TRANSESOPHAGEAL ECHOCARDIOGRAM (TEE);  Surgeon: Candee Furbish, MD;  Location: [facility] ENDOSCOPY;  Service: Cardiovascular;  Laterality: N/A;  This TEE may be Dr. Marlou Porch or a LHC Dr    FAMILY HISTORY Both parents are alive and well. Patient has one sister who is 88 years younger and is in good health. No other history of breast or ovarian cancer in the family. No family history on file.  GYNECOLOGIC HISTORY:   (Updated January 2015) G2P2, menarche at age 67 with irregular menses. On birth control pills in the past. On Clomid to induce ovulation with first pregnancy. Also had preeclampsia with first pregnancy. Status post hysterectomy and bilateral salpingo-oophorectomy in August 2009.  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 She is originally from [place]. She's been married to [place], for 13 years. He works as an Pharmacist, hospital at [employer].   ADVANCED DIRECTIVES:    HEALTH MAINTENANCE:  (Updated 11/22/2013) History  Substance Use Topics  . Smoking status: Never Smoker   . Smokeless tobacco: Never Used  . Alcohol Use: Yes     Comment: rarely     Colonoscopy:  Never  PAP: S/P LAVH/BSO  in August 2009  Bone density: Never  Lipid panel: Not on file   No Known Allergies  Current Outpatient Prescriptions  Medication Sig Dispense Refill  . aspirin 81 MG tablet Take 81 mg by mouth daily.      . cetirizine (ZYRTEC) 10 MG tablet Take 10 mg by mouth daily.        . Cholecalciferol 10000 UNITS CAPS Take 1 capsule by mouth daily.      Marland Kitchen escitalopram (LEXAPRO) 20 MG tablet Take 20 mg by mouth daily with breakfast.       . exemestane (AROMASIN) 25 MG tablet       . LORazepam (ATIVAN) 0.5 MG tablet Take 1 mg by mouth every 4 (four) hours as needed for anxiety.      . Multiple Vitamin (MULTIVITAMIN) tablet Take 1 tablet by mouth daily.      Marland Kitchen omeprazole (PRILOSEC) 40 MG capsule Take 40 mg by mouth daily.       . palbociclib (IBRANCE) 100 MG capsule Take whole with food 21 days then off x 7.  21 capsule  3   No current facility-administered medications for this visit.  OBJECTIVE: Young white woman who appears well Filed Vitals:   07/14/14 0916  BP: 133/81  Pulse: 80  Temp: 99 F (37.2 C)  Resp: 18  Body mass index is 33.46 kg/(m^2).  ECOG: 1 Filed Weights   07/14/14 0916  Weight: 195 lb (88.451 kg)   Sclerae unicteric, EOMs intact, no erythema or other lesions noted Oropharynx clear and moist No cervical or supraclavicular adenopathy Lungs no rales or rhonchi Heart regular rate and rhythm Abd soft, nontender, positive bowel sounds MSK no focal spinal tenderness, no upper extremity lymphedema Neuro: nonfocal, well oriented, positive affect Breasts: Status post bilateral mastectomies. No evidence of local recurrence. Both axillae are benign Skin: The lesion in the right upper quadrant is not erythematous or palpable. There is what appears to be a pore in the middle of it. It appears to be a resolving insect bite.  LAB RESULTS: Results for RYELEIGH, SANTORE (MRN [id number]) as of 07/14/2014 09:37  Ref. Range 06/16/2014 08:17 06/23/2014 12:39 06/30/2014 13:11 07/07/2014 13:20  07/14/2014 08:56  NEUT# Latest Range: 1.7-7.7 K/uL 0.9 (L) 0.9 (L) 1.5 2.7 3.4   Lab Results  Component Value Date   WBC 4.5 07/14/2014   NEUTROABS 3.4 07/14/2014   HGB 13.2 07/14/2014   HCT 40.1 07/14/2014   MCV 94.0 07/14/2014   PLT 227 07/14/2014      Chemistry      Component Value Date/Time   NA 142 07/07/2014 1320   NA 142 11/12/2012 0735   K 3.6 07/07/2014 1320   K 3.8 11/12/2012 0735   CL 104 11/14/2012 1407   CL 104 11/12/2012 0735   CO2 28 07/07/2014 1320   CO2 30 11/12/2012 0735   BUN 12.8 07/07/2014 1320   BUN 13 11/12/2012 0735   CREATININE 1.0 07/07/2014 1320   CREATININE 0.97 11/12/2012 0735      Component Value Date/Time   CALCIUM 9.6 07/07/2014 1320   CALCIUM 9.1 11/12/2012 0735   ALKPHOS 54 07/07/2014 1320   ALKPHOS 69 11/12/2012 0735   AST 25 07/07/2014 1320   AST 16 11/12/2012 0735   ALT 17 07/07/2014 1320   ALT 13 11/12/2012 0735   BILITOT 0.65 07/07/2014 1320   BILITOT 0.9 11/12/2012 0735      STUDIES: No results found.  ASSESSMENT: 48 y.o. [place] woman  (1)  status post left lumpectomy under the care of Dr. Margot Hoffman on 11/18/2005 for a 2.5 cm grade 3 invasive ductal carcinoma, ER +70%, PR +41%, HER-2/neu negative, with proliferation marker of 38%. 2 of 23 lymph nodes were involved.  Margins were clear.  (2) status post adjuvant chemotherapy consisting of 6 q. three-week doses of docetaxel/doxorubicin/cyclophosphamide given between January 2007 and may of 2007, with last dose on 04/20/2006.  (3) status post radiation therapy to the left breast, completed 07/11/2006  (4) began on tamoxifen in early August 2007 and continued until October 2009. She status post hysterectomy with bilateral salpingo-oophorectomy on 07/15/2008, and was started on letrozole in October of 2009.  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 PET and CT of the chest showed no  evidence of metastasis in January 2011.  (6) status post bilateral mastectomies 01/25/2010, with the right breast showing no evidence of malignancy; in the left breast showing a 1.0 cm grade 2 invasive ductal carcinoma with high-grade DCIS. Tumor was ER +22%, PR negative, HER-2/neu negative, with proliferation marker of 13%. Margins  were clear. Patient Hoffman concurrent left latissimus flap reconstruction and right implant reconstruction.  (7)  status post additional chemotherapy with one cycle of carboplatin/gemcitabine given on 03/11/2010. She then received 5 q. three-week cycles of IV CMF between 03/25/2010 and 06/17/2010.  (8) started on exemestane in January 2012 and continued until late November 2013 when she was hospitalized for apparent TIA at which time her exemestane was stopped and not resumed  (9) METASTATIC DISEASE: evidence of disease recurence noted on chest CT, liver MRI and PET scan in December 2014, with suspicious lung nodules, lymphadenopathy, and 3 lesions in the right lobe of the liver, but no evidence of bony disease and no evidence of brain involvement by brain MRI September 2014. Biopsy of a left supraclavicular lymph node on 12/11/2013 confirmed metastatic carcinoma, estrogen receptor 45% positive, progesterone receptor 17% positive, with no HER-2 amplification.  (10) fulvestrant started 12/03/2013, discontinued 03/25/2014 with progression  (11) exemestane/ everolimus started 04/04/2014; everolimus held 04/14/2014, with skin rash and mouth soreness; resumed at 5 mg a day as of 04/29/2014, discontinued 05/09/2014 with similar symptoms  (12) continueing exemestane, adding palbociclib 05/26/2014;   (a) Abraxane dose dropped to 100 mg daily for 21 days begin with cycle 2  (13) [facility] referral placed 04/28/2049 -- re-sent 05/23/2014   PLAN: Collyn's mom had never come to a visit with her before, so she had many questions today. We discussed the fact that stage IV breast  cancer is not curable. Accordingly we are not treating as intensively as we did when we were hoping for cure. I reassured her that her current treatment is extremely promising, has a good chance of controlling her disease, and that sometimes not controlled the last 4 years.  Right now we are still trying to find the best dose of although sickly for Shey. She seems to be tolerating the current dose, 100 mg daily, well. She will return next week just for labs and then 2 weeks from now for labs and a visit. That will be at the end of her second cycle. If her counts hold up, we will start checking labs every 2 weeks and also we will obtain a repeat MRI of the liver to serve as a new baseline. I am just waiting on that until we know she is going to be able to tolerate the current dose of Palbociclib.  I don't know what it is that she has in the right lower quadrant of the abdomen. It looks like an insect bite we will simply keep treating that symptomatically, with Neosporin. As far her feeling "stuffy head" I do think that's viral and she will continue Zyrtec and consider adding Sudafed symptomatically.  The patient has a good understanding of the overall plan. She agrees with it. She knows the goal of treatment in her case is control. She will call with any problems that may develop before next visit here. Chauncey Cruel, MD   07/14/2014 9:55 AM

## 2014-07-14 NOTE — Telephone Encounter (Signed)
, °

## 2014-07-21 ENCOUNTER — Encounter: Payer: Self-pay | Admitting: General Practice

## 2014-07-21 ENCOUNTER — Other Ambulatory Visit (HOSPITAL_BASED_OUTPATIENT_CLINIC_OR_DEPARTMENT_OTHER): Payer: BC Managed Care – PPO

## 2014-07-21 DIAGNOSIS — Z8673 Personal history of transient ischemic attack (TIA), and cerebral infarction without residual deficits: Secondary | ICD-10-CM

## 2014-07-21 DIAGNOSIS — C50312 Malignant neoplasm of lower-inner quadrant of left female breast: Secondary | ICD-10-CM

## 2014-07-21 DIAGNOSIS — C50919 Malignant neoplasm of unspecified site of unspecified female breast: Secondary | ICD-10-CM

## 2014-07-21 DIAGNOSIS — G8929 Other chronic pain: Secondary | ICD-10-CM

## 2014-07-21 DIAGNOSIS — C50319 Malignant neoplasm of lower-inner quadrant of unspecified female breast: Secondary | ICD-10-CM

## 2014-07-21 DIAGNOSIS — R51 Headache: Secondary | ICD-10-CM

## 2014-07-21 LAB — CBC WITH DIFFERENTIAL/PLATELET
BASO%: 1 % (ref 0.0–2.0)
BASOS ABS: 0 10*3/uL (ref 0.0–0.1)
EOS%: 2.5 % (ref 0.0–7.0)
Eosinophils Absolute: 0.1 10*3/uL (ref 0.0–0.5)
HCT: 38.7 % (ref 34.8–46.6)
HEMOGLOBIN: 12.7 g/dL (ref 11.6–15.9)
LYMPH#: 1 10*3/uL (ref 0.9–3.3)
LYMPH%: 31.7 % (ref 14.0–49.7)
MCH: 31.1 pg (ref 25.1–34.0)
MCHC: 32.9 g/dL (ref 31.5–36.0)
MCV: 94.5 fL (ref 79.5–101.0)
MONO#: 0.1 10*3/uL (ref 0.1–0.9)
MONO%: 4.3 % (ref 0.0–14.0)
NEUT#: 1.8 10*3/uL (ref 1.5–6.5)
NEUT%: 60.5 % (ref 38.4–76.8)
Platelets: 207 10*3/uL (ref 145–400)
RBC: 4.09 10*6/uL (ref 3.70–5.45)
RDW: 17.7 % — AB (ref 11.2–14.5)
WBC: 3 10*3/uL — ABNORMAL LOW (ref 3.9–10.3)

## 2014-07-21 NOTE — Progress Notes (Signed)
Visited with Kristin Hoffman and her sons Kristin Hoffman and Kristin Hoffman at [employer] table, providing empathic listening, information about support resources (specifically including Spiritual Care and book studies), witness to pt's story, and affirmation and encouragement related to parenting as a patient.  Kristin Hoffman was appreciative of pastoral presence and emotional support and plans to reach out in the future for further chaplain care.  [facility], [place]

## 2014-07-23 ENCOUNTER — Encounter: Payer: Self-pay | Admitting: General Practice

## 2014-07-23 NOTE — Progress Notes (Signed)
Followed up via voice mail to invite Kristin Hoffman and family to [facility] Night on 9/14.    [place], [place]

## 2014-07-24 ENCOUNTER — Telehealth: Payer: Self-pay | Admitting: Oncology

## 2014-07-24 NOTE — Telephone Encounter (Signed)
pt lmonvm 8/12 requesting that 8/17 lab be moved to 1pm and 8/17 f/u w/heather be moved to 8/24 after 9am lab. appts changed accordingly and specific time requested per not available. lmonvm for pt re changes and new appts for lab only 8/17 @ 12:45pm and lb/HF 8/24 @ 8:45am. schedule mailed.

## 2014-07-28 ENCOUNTER — Other Ambulatory Visit (HOSPITAL_BASED_OUTPATIENT_CLINIC_OR_DEPARTMENT_OTHER): Payer: BC Managed Care – PPO

## 2014-07-28 ENCOUNTER — Ambulatory Visit: Payer: BC Managed Care – PPO | Admitting: Nurse Practitioner

## 2014-07-28 DIAGNOSIS — C50319 Malignant neoplasm of lower-inner quadrant of unspecified female breast: Secondary | ICD-10-CM

## 2014-07-28 DIAGNOSIS — C770 Secondary and unspecified malignant neoplasm of lymph nodes of head, face and neck: Secondary | ICD-10-CM

## 2014-07-28 DIAGNOSIS — C50312 Malignant neoplasm of lower-inner quadrant of left female breast: Secondary | ICD-10-CM

## 2014-07-28 DIAGNOSIS — C50919 Malignant neoplasm of unspecified site of unspecified female breast: Secondary | ICD-10-CM

## 2014-07-28 DIAGNOSIS — Z8673 Personal history of transient ischemic attack (TIA), and cerebral infarction without residual deficits: Secondary | ICD-10-CM

## 2014-07-28 DIAGNOSIS — R519 Headache, unspecified: Secondary | ICD-10-CM

## 2014-07-28 DIAGNOSIS — R51 Headache: Secondary | ICD-10-CM

## 2014-07-28 LAB — CBC WITH DIFFERENTIAL/PLATELET
BASO%: 1.4 % (ref 0.0–2.0)
BASOS ABS: 0 10*3/uL (ref 0.0–0.1)
EOS%: 1.1 % (ref 0.0–7.0)
Eosinophils Absolute: 0 10*3/uL (ref 0.0–0.5)
HCT: 38.7 % (ref 34.8–46.6)
HEMOGLOBIN: 12.7 g/dL (ref 11.6–15.9)
LYMPH#: 1 10*3/uL (ref 0.9–3.3)
LYMPH%: 40.9 % (ref 14.0–49.7)
MCH: 31.5 pg (ref 25.1–34.0)
MCHC: 32.8 g/dL (ref 31.5–36.0)
MCV: 96 fL (ref 79.5–101.0)
MONO#: 0.1 10*3/uL (ref 0.1–0.9)
MONO%: 5.8 % (ref 0.0–14.0)
NEUT%: 50.8 % (ref 38.4–76.8)
NEUTROS ABS: 1.3 10*3/uL — AB (ref 1.5–6.5)
Platelets: 154 10*3/uL (ref 145–400)
RBC: 4.03 10*6/uL (ref 3.70–5.45)
RDW: 19.6 % — AB (ref 11.2–14.5)
WBC: 2.5 10*3/uL — ABNORMAL LOW (ref 3.9–10.3)

## 2014-07-28 LAB — COMPREHENSIVE METABOLIC PANEL (CC13)
ALT: 11 U/L (ref 0–55)
AST: 22 U/L (ref 5–34)
Albumin: 4.3 g/dL (ref 3.5–5.0)
Alkaline Phosphatase: 45 U/L (ref 40–150)
Anion Gap: 9 mEq/L (ref 3–11)
BILIRUBIN TOTAL: 0.73 mg/dL (ref 0.20–1.20)
BUN: 13.6 mg/dL (ref 7.0–26.0)
CO2: 24 mEq/L (ref 22–29)
CREATININE: 1 mg/dL (ref 0.6–1.1)
Calcium: 9.5 mg/dL (ref 8.4–10.4)
Chloride: 108 mEq/L (ref 98–109)
Glucose: 84 mg/dl (ref 70–140)
Potassium: 4 mEq/L (ref 3.5–5.1)
Sodium: 141 mEq/L (ref 136–145)
Total Protein: 7.3 g/dL (ref 6.4–8.3)

## 2014-07-29 ENCOUNTER — Telehealth: Payer: Self-pay | Admitting: *Deleted

## 2014-07-29 NOTE — Telephone Encounter (Signed)
Message copied by Kelvyn Schunk, [facility] on Tue Jul 29, 2014  9:24 AM ------      Message from: Prentiss Bells      Created: Fri Jul 25, 2014  5:05 PM      Regarding: Ibrance       Labs Due ------

## 2014-07-29 NOTE — Telephone Encounter (Signed)
This RN spoke with pt per lab results and current ibrance regimen.  Presently pt is on week 4 ( no drug ) of Ibrance 100mg  daily x 21 days.  Per phone conversation Kristin Hoffman states she has had ongoing headaches.  Headaches are " all over the head - maybe starts on the right side and then it will go down my neck ".  Kristin Hoffman states she is reluctant to take ibuprophen " because usually I have to take larger doses and that may affect my platelets"  Per inquiry Kristin Hoffman denies any dizziness, speech or ambulatory changes. She denies any nausea.  Per discussion pt will intiate use of OTC medications - sudafed and benadryl in addition to current use of zyrtec. She will increase fluid intake with use of hot beverages.

## 2014-08-04 ENCOUNTER — Telehealth: Payer: Self-pay | Admitting: Nurse Practitioner

## 2014-08-04 ENCOUNTER — Encounter: Payer: Self-pay | Admitting: Nurse Practitioner

## 2014-08-04 ENCOUNTER — Ambulatory Visit (HOSPITAL_BASED_OUTPATIENT_CLINIC_OR_DEPARTMENT_OTHER): Payer: BC Managed Care – PPO | Admitting: Nurse Practitioner

## 2014-08-04 ENCOUNTER — Other Ambulatory Visit (HOSPITAL_BASED_OUTPATIENT_CLINIC_OR_DEPARTMENT_OTHER): Payer: BC Managed Care – PPO

## 2014-08-04 ENCOUNTER — Other Ambulatory Visit: Payer: Self-pay | Admitting: *Deleted

## 2014-08-04 VITALS — BP 139/90 | HR 92 | Temp 98.8°F | Resp 18 | Ht 64.0 in | Wt 192.3 lb

## 2014-08-04 DIAGNOSIS — R51 Headache: Secondary | ICD-10-CM

## 2014-08-04 DIAGNOSIS — R519 Headache, unspecified: Secondary | ICD-10-CM

## 2014-08-04 DIAGNOSIS — C50919 Malignant neoplasm of unspecified site of unspecified female breast: Secondary | ICD-10-CM

## 2014-08-04 DIAGNOSIS — C50312 Malignant neoplasm of lower-inner quadrant of left female breast: Secondary | ICD-10-CM

## 2014-08-04 DIAGNOSIS — C50319 Malignant neoplasm of lower-inner quadrant of unspecified female breast: Secondary | ICD-10-CM

## 2014-08-04 DIAGNOSIS — C770 Secondary and unspecified malignant neoplasm of lymph nodes of head, face and neck: Secondary | ICD-10-CM

## 2014-08-04 DIAGNOSIS — D702 Other drug-induced agranulocytosis: Secondary | ICD-10-CM | POA: Insufficient documentation

## 2014-08-04 DIAGNOSIS — Z8673 Personal history of transient ischemic attack (TIA), and cerebral infarction without residual deficits: Secondary | ICD-10-CM

## 2014-08-04 DIAGNOSIS — R799 Abnormal finding of blood chemistry, unspecified: Secondary | ICD-10-CM

## 2014-08-04 LAB — CBC WITH DIFFERENTIAL/PLATELET
BASO%: 1 % (ref 0.0–2.0)
Basophils Absolute: 0 10*3/uL (ref 0.0–0.1)
EOS%: 1.1 % (ref 0.0–7.0)
Eosinophils Absolute: 0 10*3/uL (ref 0.0–0.5)
HEMATOCRIT: 37.9 % (ref 34.8–46.6)
HEMOGLOBIN: 12.5 g/dL (ref 11.6–15.9)
LYMPH%: 43.8 % (ref 14.0–49.7)
MCH: 32.3 pg (ref 25.1–34.0)
MCHC: 32.9 g/dL (ref 31.5–36.0)
MCV: 98.2 fL (ref 79.5–101.0)
MONO#: 0.2 10*3/uL (ref 0.1–0.9)
MONO%: 11.3 % (ref 0.0–14.0)
NEUT#: 0.9 10*3/uL — ABNORMAL LOW (ref 1.5–6.5)
NEUT%: 42.8 % (ref 38.4–76.8)
PLATELETS: 179 10*3/uL (ref 145–400)
RBC: 3.86 10*6/uL (ref 3.70–5.45)
RDW: 20.6 % — ABNORMAL HIGH (ref 11.2–14.5)
WBC: 2 10*3/uL — ABNORMAL LOW (ref 3.9–10.3)
lymph#: 0.9 10*3/uL (ref 0.9–3.3)

## 2014-08-04 MED ORDER — PALBOCICLIB 75 MG PO CAPS: ORAL_CAPSULE | ORAL | Status: DC

## 2014-08-04 NOTE — Telephone Encounter (Signed)
, °

## 2014-08-04 NOTE — Progress Notes (Signed)
ID: Kristin Hoffman OB: 03/10/1966  MR#: [id number]  MKL#:[id number]  PCP: Drake Leach, MD GYN:  [name] SU: [name] OTHER MD: [name]  CHIEF COMPLAINT:  Stage IV breast cancer CURRENT TREATMENT: exemestane and palbociclib  BREAST CANCER HISTORY: This patient was previously followed by [name], and was transferred to [name] service as of 08/20/2013.  At the age of 48, the patient had a screening mammogram as a baseline. She had recently given birth and was on birth control pills at that time. The mammogram in November 2006 showed an area of architectural distortion in the left lower inner quadrant. Subsequently an ultrasound was obtained of the left breast showing a 2.0 x 1.5 x 1.4 cm mass, at the 8:00 position, 4 cm from the nipple. A core biopsy performed on 10/21/2005 showed an invasive mammary carcinoma, ER +70%, PR +41%, HER-2/neu negative, with proliferation marker of 38%. [phone])  Breast MRI on 11/08/2005 confirmed a 2.5 cm spiculated mass in the left lower inner quadrant with 3 small satellite nodules adjacent to the primary mass: 3.5 cm posterior medial to the primary mass, measuring 9 mm; 1.5 cm anterior medial to the primary mass measuring 5 mm; and 2 cm medial to the primary mass measuring 5 mm. No axillary adenopathy was noted. No suspicious masses or enhancement are noted in the right breast.  Kristin Hoffman underwentt left lumpectomy under the care of Dr. 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 2 of 23 lymph nodes were involved.  Margins were clear.  She received adjuvant chemotherapy consisting of 6 q. three-week doses of docetaxel/doxorubicin/cyclophosphamide given between January 2007 and may of 2007, with last dose on 04/20/2006. She underwent radiation therapy completed 07/11/2006, after which she began on tamoxifen in early August 2007.  She  underwent hysterectomy with bilateral salpingo-oophorectomy on 07/15/2008, and was started on letrozole in October of 2009.  A mammogram 11/10/2009 showed microcalcifications in the left breast, and a subsequent biopsy on 11/18/2009 confirmed invasive mammary carcinoma in the left breast. PET and CT of the chest showed no evidence of metastasis in January 2011.  She underwent bilateral mastectomies 01/25/2010, with the right breast showing no evidence of malignancy; in the left breast showing a 1.0 cm grade 2 invasive ductal carcinoma with high-grade DCIS. Tumor was ER +22%, PR negative, HER-2/neu negative, with proliferation marker of 13%. Margins were clear. Patient underwent concurrent left latissimus flap reconstruction and right implant reconstruction.  She received additional chemotherapy with one cycle of carboplatin/gemcitabine given on 03/11/2010. She then received 5 q. three-week cycles of IV CMF between 03/25/2010 and 06/17/2010.  She was started on exemestane in January 2012 and continued until late November 2013 when she was hospitalized for apparent TIA.   Her subsequent history is as detailed below  INTERVAL HISTORY: Kristin Hoffman returns today for followup of her metastatic breast cancer. Today is day 1 cycle 3 of her exemestane/Palbociclib therapy. She has been on 47m palbociclib x 21days.   REVIEW OF SYSTEMS: CLarindais tolerating her therapy well. The exemestane causes occasional hot flashes that are of no concern to her. She denies vaginal dryness. She denies fevers, chills, nausea, vomiting, or changes in bowel habits. She is still battling sinus symptoms with a runny nose and congestion. She has been taking sudafed for this. The supposed "insect bite" to her right upper quadrant has healed over. A detailed review of systems  was otherwise noncontributory.   PAST MEDICAL HISTORY: Past Medical History  Diagnosis Date  . Breast cancer   . Depression   . Reflux   . Cancer   . Hx-TIA  (transient ischemic attack) 11/22/2013  . Chronic headaches 11/22/2013  . Anxiety 11/22/2013  . Breast cancer metastasized to multiple sites 12/24/2013    PAST SURGICAL HISTORY: Past Surgical History  Procedure Laterality Date  . Mastectomy w/ nodes partial    . Mastectomy    . Breast reconstruction    . Portacath placement      x 2  . Portacath removal      x 2  . Abdominal hysterectomy    . Tee without cardioversion  11/12/2012    Procedure: TRANSESOPHAGEAL ECHOCARDIOGRAM (TEE);  Surgeon: Candee Furbish, MD;  Location: [facility] ENDOSCOPY;  Service: Cardiovascular;  Laterality: N/A;  This TEE may be Dr. Marlou Porch or a LHC Dr    FAMILY HISTORY Both parents are alive and well. Patient has one sister who is 31 years younger and is in good health. No other history of breast or ovarian cancer in the family. No family history on file.  GYNECOLOGIC HISTORY:   (Updated January 2015) G2P2, menarche at age 47 with irregular menses. On birth control pills in the past. On Clomid to induce ovulation with first pregnancy. Also had preeclampsia with first pregnancy. Status post hysterectomy and bilateral salpingo-oophorectomy in August 2009.  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 She is originally from [place]. She's been married to [place], for 13 years. He works as an Pharmacist, hospital at [employer].   ADVANCED DIRECTIVES:    HEALTH MAINTENANCE:  (Updated 11/22/2013) History  Substance Use Topics  . Smoking status: Never Smoker   . Smokeless tobacco: Never Used  . Alcohol Use: Yes     Comment: rarely     Colonoscopy:  Never  PAP: S/P LAVH/BSO in August 2009  Bone density: Never  Lipid panel: Not on file   No Known Allergies  Current Outpatient Prescriptions  Medication Sig Dispense Refill  . aspirin 81 MG tablet Take 81 mg by mouth daily.      . cetirizine (ZYRTEC) 10 MG tablet Take 10 mg by  mouth daily.        . Cholecalciferol 10000 UNITS CAPS Take 1 capsule by mouth daily.      Marland Kitchen escitalopram (LEXAPRO) 20 MG tablet Take 20 mg by mouth daily with breakfast.       . exemestane (AROMASIN) 25 MG tablet       . LORazepam (ATIVAN) 0.5 MG tablet Take 1 mg by mouth every 4 (four) hours as needed for anxiety.      . Multiple Vitamin (MULTIVITAMIN) tablet Take 1 tablet by mouth daily.      Marland Kitchen omeprazole (PRILOSEC) 40 MG capsule Take 40 mg by mouth daily.       . palbociclib (IBRANCE) 100 MG capsule Take whole with food 21 days then off x 7.  21 capsule  3  . Pseudoephedrine HCl (SUDAFED 12 HOUR PO) Take 1 tablet by mouth 2 (two) times daily.       No current facility-administered medications for this visit.    OBJECTIVE: Young white woman who appears well Filed Vitals:   08/04/14 0910  BP: 139/90  Pulse: 92  Temp: 98.8 F (37.1 C)  Resp: 18  Body mass index  is 32.99 kg/(m^2).  ECOG: 1 Filed Weights   08/04/14 0910  Weight: 192 lb 4.8 oz (87.227 kg)   Skin: warm, dry, small red lesion to upper right quadrant appears to resolving, no exudate noted. HEENT: sclerae anicteric, conjunctivae pink, oropharynx clear. No thrush or mucositis.  Lymph Nodes: No cervical or supraclavicular lymphadenopathy  Lungs: clear to auscultation bilaterally, no rales, wheezes, or rhonci  Heart: regular rate and rhythm  Abdomen: round, soft, non tender, positive bowel sounds  Musculoskeletal: No focal spinal tenderness, no peripheral edema  Neuro: non focal, well oriented, positive affect  Breasts: deferred  LAB RESULTS: Results for MIRRIAM, VADALA (MRN [id number]) as of 07/14/2014 09:37  Ref. Range 06/16/2014 08:17 06/23/2014 12:39 06/30/2014 13:11 07/07/2014 13:20 07/14/2014 08:56  NEUT# Latest Range: 1.7-7.7 K/uL 0.9 (L) 0.9 (L) 1.5 2.7 3.4   Lab Results  Component Value Date   WBC 2.0* 08/04/2014   NEUTROABS 0.9* 08/04/2014   HGB 12.5 08/04/2014   HCT 37.9 08/04/2014   MCV 98.2 08/04/2014   PLT 179  08/04/2014      Chemistry      Component Value Date/Time   NA 141 07/28/2014 1313   NA 142 11/12/2012 0735   K 4.0 07/28/2014 1313   K 3.8 11/12/2012 0735   CL 104 11/14/2012 1407   CL 104 11/12/2012 0735   CO2 24 07/28/2014 1313   CO2 30 11/12/2012 0735   BUN 13.6 07/28/2014 1313   BUN 13 11/12/2012 0735   CREATININE 1.0 07/28/2014 1313   CREATININE 0.97 11/12/2012 0735      Component Value Date/Time   CALCIUM 9.5 07/28/2014 1313   CALCIUM 9.1 11/12/2012 0735   ALKPHOS 45 07/28/2014 1313   ALKPHOS 69 11/12/2012 0735   AST 22 07/28/2014 1313   AST 16 11/12/2012 0735   ALT 11 07/28/2014 1313   ALT 13 11/12/2012 0735   BILITOT 0.73 07/28/2014 1313   BILITOT 0.9 11/12/2012 0735      STUDIES: No results found.  ASSESSMENT: 48 y.o. [place] woman  (1)  status post left lumpectomy under the care of Dr. 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 2 of 23 lymph nodes were involved.  Margins were clear.  (2) status post adjuvant chemotherapy consisting of 6 q. three-week doses of docetaxel/doxorubicin/cyclophosphamide given between January 2007 and may of 2007, with last dose on 04/20/2006.  (3) status post radiation therapy to the left breast, completed 07/11/2006  (4) began on tamoxifen in early August 2007 and continued until October 2009. She status post hysterectomy with bilateral salpingo-oophorectomy on 07/15/2008, and was started on letrozole in October of 2009.  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 PET and CT of the chest showed no evidence of metastasis in January 2011.  (6) status post bilateral mastectomies 01/25/2010, with the right breast showing no evidence of malignancy; in the left breast showing a 1.0 cm grade 2 invasive ductal carcinoma with high-grade  DCIS. Tumor was ER +22%, PR negative, HER-2/neu negative, with proliferation marker of 13%. Margins were clear. Patient underwent concurrent left latissimus flap reconstruction and right implant reconstruction.  (7)  status post additional chemotherapy with one cycle of carboplatin/gemcitabine given on 03/11/2010. She then received 5 q. three-week cycles of IV CMF between 03/25/2010 and 06/17/2010.  (8) started on exemestane  in January 2012 and continued until late November 2013 when she was hospitalized for apparent TIA at which time her exemestane was stopped and not resumed  (9) METASTATIC DISEASE: evidence of disease recurence noted on chest CT, liver MRI and PET scan in December 2014, with suspicious lung nodules, lymphadenopathy, and 3 lesions in the right lobe of the liver, but no evidence of bony disease and no evidence of brain involvement by brain MRI September 2014. Biopsy of a left supraclavicular lymph node on 12/11/2013 confirmed metastatic carcinoma, estrogen receptor 45% positive, progesterone receptor 17% positive, with no HER-2 amplification.  (10) fulvestrant started 12/03/2013, discontinued 03/25/2014 with progression  (11) exemestane/ everolimus started 04/04/2014; everolimus held 04/14/2014, with skin rash and mouth soreness; resumed at 5 mg a day as of 04/29/2014, discontinued 05/09/2014 with similar symptoms  (12) continueing exemestane, adding palbociclib 05/26/2014;   (a) palbociclib dose dropped to 75 mg daily for 21 days begin with cycle 3  (13) [facility] referral placed 04/28/2049 -- re-sent 05/23/2014   PLAN: Kristin Hoffman is doing well on the exemestane and palbociclib with no complaints. However, her CBC today showed a decreasing neutrophil count of 0.9. For this reason we will delay the start of cycle 3 for 1 week and resume at 48m x 21 days starting next week. CTziporahwill return for labs before the start of this cycle next Monday to ensure the counts have rebounded. She will  then proceed to have lab visits every Fri instead. Her next office visit will be before the start of Cycle 4 on 9/25. We will obtain a repeat MRI of the liver to serve as a new baseline when we find a dose of palbociclib that she can tolerate.   Kristin Hoffman understands and agrees with this plan. She knows the goal of treatment in her case is control. She has been encouraged to call with any issues that might arise before her next visit here.   FMarcelino Duster NP   08/04/2014 10:52 AM

## 2014-08-11 ENCOUNTER — Other Ambulatory Visit (HOSPITAL_BASED_OUTPATIENT_CLINIC_OR_DEPARTMENT_OTHER): Payer: BC Managed Care – PPO

## 2014-08-11 DIAGNOSIS — R51 Headache: Secondary | ICD-10-CM

## 2014-08-11 DIAGNOSIS — R519 Headache, unspecified: Secondary | ICD-10-CM

## 2014-08-11 DIAGNOSIS — C50919 Malignant neoplasm of unspecified site of unspecified female breast: Secondary | ICD-10-CM

## 2014-08-11 DIAGNOSIS — C770 Secondary and unspecified malignant neoplasm of lymph nodes of head, face and neck: Secondary | ICD-10-CM

## 2014-08-11 DIAGNOSIS — Z8673 Personal history of transient ischemic attack (TIA), and cerebral infarction without residual deficits: Secondary | ICD-10-CM

## 2014-08-11 DIAGNOSIS — C50312 Malignant neoplasm of lower-inner quadrant of left female breast: Secondary | ICD-10-CM

## 2014-08-11 DIAGNOSIS — C50319 Malignant neoplasm of lower-inner quadrant of unspecified female breast: Secondary | ICD-10-CM

## 2014-08-11 LAB — CBC WITH DIFFERENTIAL/PLATELET
BASO%: 1.4 % (ref 0.0–2.0)
Basophils Absolute: 0 10*3/uL (ref 0.0–0.1)
EOS%: 1.2 % (ref 0.0–7.0)
Eosinophils Absolute: 0 10*3/uL (ref 0.0–0.5)
HCT: 39.6 % (ref 34.8–46.6)
HGB: 13 g/dL (ref 11.6–15.9)
LYMPH%: 36 % (ref 14.0–49.7)
MCH: 31.9 pg (ref 25.1–34.0)
MCHC: 32.8 g/dL (ref 31.5–36.0)
MCV: 97.4 fL (ref 79.5–101.0)
MONO#: 0.3 10*3/uL (ref 0.1–0.9)
MONO%: 9.1 % (ref 0.0–14.0)
NEUT#: 1.8 10*3/uL (ref 1.5–6.5)
NEUT%: 52.3 % (ref 38.4–76.8)
Platelets: 252 10*3/uL (ref 145–400)
RBC: 4.07 10*6/uL (ref 3.70–5.45)
RDW: 19.9 % — AB (ref 11.2–14.5)
WBC: 3.4 10*3/uL — ABNORMAL LOW (ref 3.9–10.3)
lymph#: 1.2 10*3/uL (ref 0.9–3.3)

## 2014-08-15 ENCOUNTER — Other Ambulatory Visit (HOSPITAL_BASED_OUTPATIENT_CLINIC_OR_DEPARTMENT_OTHER): Payer: BC Managed Care – PPO

## 2014-08-15 ENCOUNTER — Telehealth: Payer: Self-pay | Admitting: *Deleted

## 2014-08-15 DIAGNOSIS — C50312 Malignant neoplasm of lower-inner quadrant of left female breast: Secondary | ICD-10-CM

## 2014-08-15 DIAGNOSIS — R519 Headache, unspecified: Secondary | ICD-10-CM

## 2014-08-15 DIAGNOSIS — C50919 Malignant neoplasm of unspecified site of unspecified female breast: Secondary | ICD-10-CM

## 2014-08-15 DIAGNOSIS — C50319 Malignant neoplasm of lower-inner quadrant of unspecified female breast: Secondary | ICD-10-CM

## 2014-08-15 DIAGNOSIS — Z8673 Personal history of transient ischemic attack (TIA), and cerebral infarction without residual deficits: Secondary | ICD-10-CM

## 2014-08-15 DIAGNOSIS — R51 Headache: Secondary | ICD-10-CM

## 2014-08-15 DIAGNOSIS — C770 Secondary and unspecified malignant neoplasm of lymph nodes of head, face and neck: Secondary | ICD-10-CM

## 2014-08-15 LAB — COMPREHENSIVE METABOLIC PANEL (CC13)
ALK PHOS: 55 U/L (ref 40–150)
ALT: 17 U/L (ref 0–55)
AST: 25 U/L (ref 5–34)
Albumin: 4.1 g/dL (ref 3.5–5.0)
Anion Gap: 7 mEq/L (ref 3–11)
BILIRUBIN TOTAL: 0.73 mg/dL (ref 0.20–1.20)
BUN: 10.6 mg/dL (ref 7.0–26.0)
CO2: 28 mEq/L (ref 22–29)
Calcium: 9.3 mg/dL (ref 8.4–10.4)
Chloride: 106 mEq/L (ref 98–109)
Creatinine: 1.1 mg/dL (ref 0.6–1.1)
Glucose: 105 mg/dl (ref 70–140)
Potassium: 4.1 mEq/L (ref 3.5–5.1)
SODIUM: 141 meq/L (ref 136–145)
TOTAL PROTEIN: 6.8 g/dL (ref 6.4–8.3)

## 2014-08-15 LAB — CBC WITH DIFFERENTIAL/PLATELET
BASO%: 1 % (ref 0.0–2.0)
Basophils Absolute: 0 10*3/uL (ref 0.0–0.1)
EOS ABS: 0 10*3/uL (ref 0.0–0.5)
EOS%: 0.8 % (ref 0.0–7.0)
HCT: 40.1 % (ref 34.8–46.6)
HGB: 13.1 g/dL (ref 11.6–15.9)
LYMPH%: 33.7 % (ref 14.0–49.7)
MCH: 31.7 pg (ref 25.1–34.0)
MCHC: 32.6 g/dL (ref 31.5–36.0)
MCV: 97 fL (ref 79.5–101.0)
MONO#: 0.3 10*3/uL (ref 0.1–0.9)
MONO%: 9.5 % (ref 0.0–14.0)
NEUT#: 2 10*3/uL (ref 1.5–6.5)
NEUT%: 55 % (ref 38.4–76.8)
Platelets: 277 10*3/uL (ref 145–400)
RBC: 4.13 10*6/uL (ref 3.70–5.45)
RDW: 19.8 % — AB (ref 11.2–14.5)
WBC: 3.6 10*3/uL — AB (ref 3.9–10.3)
lymph#: 1.2 10*3/uL (ref 0.9–3.3)

## 2014-08-19 ENCOUNTER — Other Ambulatory Visit: Payer: BC Managed Care – PPO

## 2014-08-22 ENCOUNTER — Other Ambulatory Visit (HOSPITAL_BASED_OUTPATIENT_CLINIC_OR_DEPARTMENT_OTHER): Payer: BC Managed Care – PPO

## 2014-08-22 DIAGNOSIS — Z8673 Personal history of transient ischemic attack (TIA), and cerebral infarction without residual deficits: Secondary | ICD-10-CM

## 2014-08-22 DIAGNOSIS — C50312 Malignant neoplasm of lower-inner quadrant of left female breast: Secondary | ICD-10-CM

## 2014-08-22 DIAGNOSIS — C50319 Malignant neoplasm of lower-inner quadrant of unspecified female breast: Secondary | ICD-10-CM

## 2014-08-22 DIAGNOSIS — G8929 Other chronic pain: Secondary | ICD-10-CM

## 2014-08-22 DIAGNOSIS — R51 Headache: Secondary | ICD-10-CM

## 2014-08-22 DIAGNOSIS — C50919 Malignant neoplasm of unspecified site of unspecified female breast: Secondary | ICD-10-CM

## 2014-08-22 LAB — CBC WITH DIFFERENTIAL/PLATELET
BASO%: 0.9 % (ref 0.0–2.0)
Basophils Absolute: 0 10*3/uL (ref 0.0–0.1)
EOS%: 1.4 % (ref 0.0–7.0)
Eosinophils Absolute: 0.1 10*3/uL (ref 0.0–0.5)
HEMATOCRIT: 40.1 % (ref 34.8–46.6)
HGB: 13.1 g/dL (ref 11.6–15.9)
LYMPH%: 26.4 % (ref 14.0–49.7)
MCH: 31.5 pg (ref 25.1–34.0)
MCHC: 32.6 g/dL (ref 31.5–36.0)
MCV: 96.5 fL (ref 79.5–101.0)
MONO#: 0.4 10*3/uL (ref 0.1–0.9)
MONO%: 8.4 % (ref 0.0–14.0)
NEUT#: 3 10*3/uL (ref 1.5–6.5)
NEUT%: 62.9 % (ref 38.4–76.8)
PLATELETS: 239 10*3/uL (ref 145–400)
RBC: 4.16 10*6/uL (ref 3.70–5.45)
RDW: 18.2 % — ABNORMAL HIGH (ref 11.2–14.5)
WBC: 4.8 10*3/uL (ref 3.9–10.3)
lymph#: 1.3 10*3/uL (ref 0.9–3.3)

## 2014-08-25 ENCOUNTER — Other Ambulatory Visit: Payer: BC Managed Care – PPO

## 2014-08-27 ENCOUNTER — Telehealth: Payer: Self-pay | Admitting: Nurse Practitioner

## 2014-08-27 NOTE — Telephone Encounter (Signed)
Lvm advising appt chg on 9/25 from HF to [facility] due to HF out of office. Appt date/time remains unchanged.

## 2014-08-29 ENCOUNTER — Other Ambulatory Visit (HOSPITAL_BASED_OUTPATIENT_CLINIC_OR_DEPARTMENT_OTHER): Payer: BC Managed Care – PPO

## 2014-08-29 DIAGNOSIS — Z8673 Personal history of transient ischemic attack (TIA), and cerebral infarction without residual deficits: Secondary | ICD-10-CM

## 2014-08-29 DIAGNOSIS — C50312 Malignant neoplasm of lower-inner quadrant of left female breast: Secondary | ICD-10-CM

## 2014-08-29 DIAGNOSIS — C50919 Malignant neoplasm of unspecified site of unspecified female breast: Secondary | ICD-10-CM

## 2014-08-29 DIAGNOSIS — C50319 Malignant neoplasm of lower-inner quadrant of unspecified female breast: Secondary | ICD-10-CM

## 2014-08-29 DIAGNOSIS — G8929 Other chronic pain: Secondary | ICD-10-CM

## 2014-08-29 DIAGNOSIS — R51 Headache: Secondary | ICD-10-CM

## 2014-08-29 LAB — CBC WITH DIFFERENTIAL/PLATELET
BASO%: 1.3 % (ref 0.0–2.0)
Basophils Absolute: 0.1 10*3/uL (ref 0.0–0.1)
EOS%: 2.2 % (ref 0.0–7.0)
Eosinophils Absolute: 0.1 10*3/uL (ref 0.0–0.5)
HCT: 38.8 % (ref 34.8–46.6)
HGB: 12.7 g/dL (ref 11.6–15.9)
LYMPH#: 1.1 10*3/uL (ref 0.9–3.3)
LYMPH%: 24.9 % (ref 14.0–49.7)
MCH: 31.6 pg (ref 25.1–34.0)
MCHC: 32.6 g/dL (ref 31.5–36.0)
MCV: 96.8 fL (ref 79.5–101.0)
MONO#: 0.2 10*3/uL (ref 0.1–0.9)
MONO%: 5.1 % (ref 0.0–14.0)
NEUT#: 3.1 10*3/uL (ref 1.5–6.5)
NEUT%: 66.5 % (ref 38.4–76.8)
Platelets: 214 10*3/uL (ref 145–400)
RBC: 4.01 10*6/uL (ref 3.70–5.45)
RDW: 18.3 % — ABNORMAL HIGH (ref 11.2–14.5)
WBC: 4.6 10*3/uL (ref 3.9–10.3)

## 2014-09-05 ENCOUNTER — Telehealth: Payer: Self-pay | Admitting: Oncology

## 2014-09-05 ENCOUNTER — Ambulatory Visit (HOSPITAL_BASED_OUTPATIENT_CLINIC_OR_DEPARTMENT_OTHER): Payer: BC Managed Care – PPO | Admitting: Oncology

## 2014-09-05 ENCOUNTER — Other Ambulatory Visit (HOSPITAL_BASED_OUTPATIENT_CLINIC_OR_DEPARTMENT_OTHER): Payer: BC Managed Care – PPO

## 2014-09-05 ENCOUNTER — Encounter: Payer: Self-pay | Admitting: Oncology

## 2014-09-05 VITALS — BP 133/78 | HR 78 | Temp 98.5°F | Resp 19 | Ht 64.0 in | Wt 193.5 lb

## 2014-09-05 DIAGNOSIS — C50312 Malignant neoplasm of lower-inner quadrant of left female breast: Secondary | ICD-10-CM

## 2014-09-05 DIAGNOSIS — C50319 Malignant neoplasm of lower-inner quadrant of unspecified female breast: Secondary | ICD-10-CM

## 2014-09-05 DIAGNOSIS — C800 Disseminated malignant neoplasm, unspecified: Secondary | ICD-10-CM

## 2014-09-05 DIAGNOSIS — Z8673 Personal history of transient ischemic attack (TIA), and cerebral infarction without residual deficits: Secondary | ICD-10-CM

## 2014-09-05 DIAGNOSIS — C50912 Malignant neoplasm of unspecified site of left female breast: Secondary | ICD-10-CM

## 2014-09-05 DIAGNOSIS — R51 Headache: Secondary | ICD-10-CM

## 2014-09-05 DIAGNOSIS — C50919 Malignant neoplasm of unspecified site of unspecified female breast: Secondary | ICD-10-CM

## 2014-09-05 DIAGNOSIS — R519 Headache, unspecified: Secondary | ICD-10-CM

## 2014-09-05 LAB — COMPREHENSIVE METABOLIC PANEL (CC13)
ALBUMIN: 4 g/dL (ref 3.5–5.0)
ALK PHOS: 50 U/L (ref 40–150)
ALT: 12 U/L (ref 0–55)
AST: 22 U/L (ref 5–34)
Anion Gap: 10 mEq/L (ref 3–11)
BILIRUBIN TOTAL: 0.8 mg/dL (ref 0.20–1.20)
BUN: 14 mg/dL (ref 7.0–26.0)
CO2: 26 mEq/L (ref 22–29)
Calcium: 9.6 mg/dL (ref 8.4–10.4)
Chloride: 107 mEq/L (ref 98–109)
Creatinine: 1.1 mg/dL (ref 0.6–1.1)
Glucose: 92 mg/dl (ref 70–140)
POTASSIUM: 4 meq/L (ref 3.5–5.1)
SODIUM: 142 meq/L (ref 136–145)
Total Protein: 7.1 g/dL (ref 6.4–8.3)

## 2014-09-05 LAB — CBC WITH DIFFERENTIAL/PLATELET
BASO%: 0.9 % (ref 0.0–2.0)
Basophils Absolute: 0 10*3/uL (ref 0.0–0.1)
EOS ABS: 0.1 10*3/uL (ref 0.0–0.5)
EOS%: 2.3 % (ref 0.0–7.0)
HCT: 41.3 % (ref 34.8–46.6)
HGB: 13.3 g/dL (ref 11.6–15.9)
LYMPH%: 29.5 % (ref 14.0–49.7)
MCH: 31.8 pg (ref 25.1–34.0)
MCHC: 32.3 g/dL (ref 31.5–36.0)
MCV: 98.5 fL (ref 79.5–101.0)
MONO#: 0.2 10*3/uL (ref 0.1–0.9)
MONO%: 6.7 % (ref 0.0–14.0)
NEUT#: 1.7 10*3/uL (ref 1.5–6.5)
NEUT%: 60.6 % (ref 38.4–76.8)
Platelets: 190 10*3/uL (ref 145–400)
RBC: 4.2 10*6/uL (ref 3.70–5.45)
RDW: 18 % — AB (ref 11.2–14.5)
WBC: 2.8 10*3/uL — ABNORMAL LOW (ref 3.9–10.3)
lymph#: 0.8 10*3/uL — ABNORMAL LOW (ref 0.9–3.3)

## 2014-09-05 NOTE — Telephone Encounter (Signed)
Pt confirmed labs/ov per 09/25 POF, gave pt AVS.......  KJ °

## 2014-09-05 NOTE — Progress Notes (Signed)
ID: [name] OB: 01-28-1966  MR#: [id number]  DJS#:[id number]  PCP: Drake Leach, MD GYN:  [name] SU: [name] OTHER MD: [name]  CHIEF COMPLAINT:  Stage IV breast cancer CURRENT TREATMENT: exemestane and palbociclib  BREAST CANCER HISTORY: This patient was previously followed by [name], and was transferred to [name] service as of 08/20/2013.  At the age of 48, the patient had a screening mammogram as a baseline. She had recently given birth and was on birth control pills at that time. The mammogram in November 2006 showed an area of architectural distortion in the left lower inner quadrant. Subsequently an ultrasound was obtained of the left breast showing a 2.0 x 1.5 x 1.4 cm mass, at the 8:00 position, 4 cm from the nipple. A core biopsy performed on 10/21/2005 showed an invasive mammary carcinoma, ER +70%, PR +41%, HER-2/neu negative, with proliferation marker of 38%. [phone]  Breast MRI on 11/08/2005 confirmed a 2.5 cm spiculated mass in the left lower inner quadrant with 3 small satellite nodules adjacent to the primary mass: 3.5 cm posterior medial to the primary mass, measuring 9 mm; 1.5 cm anterior medial to the primary mass measuring 5 mm; and 2 cm medial to the primary mass measuring 5 mm. No axillary adenopathy was noted. No suspicious masses or enhancement are noted in the right breast.  Doneisha underwentt left lumpectomy under the care of Dr. 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 2 of 23 lymph nodes were involved.  Margins were clear.  She received adjuvant chemotherapy consisting of 6 q. three-week doses of docetaxel/doxorubicin/cyclophosphamide given between January 2007 and may of 2007, with last dose on 04/20/2006. She underwent radiation therapy completed 07/11/2006, after which she began on tamoxifen in early August 2007.  She  underwent hysterectomy with bilateral salpingo-oophorectomy on 07/15/2008, and was started on letrozole in October of 2009.  A mammogram 11/10/2009 showed microcalcifications in the left breast, and a subsequent biopsy on 11/18/2009 confirmed invasive mammary carcinoma in the left breast. PET and CT of the chest showed no evidence of metastasis in January 2011.  She underwent bilateral mastectomies 01/25/2010, with the right breast showing no evidence of malignancy; in the left breast showing a 1.0 cm grade 2 invasive ductal carcinoma with high-grade DCIS. Tumor was ER +22%, PR negative, HER-2/neu negative, with proliferation marker of 13%. Margins were clear. Patient underwent concurrent left latissimus flap reconstruction and right implant reconstruction.  She received additional chemotherapy with one cycle of carboplatin/gemcitabine given on 03/11/2010. She then received 5 q. three-week cycles of IV CMF between 03/25/2010 and 06/17/2010.  She was started on exemestane in January 2012 and continued until late November 2013 when she was hospitalized for apparent TIA.   Her subsequent history is as detailed below  INTERVAL HISTORY: Nate returns today for followup of her metastatic breast cancer. Today is day 15 cycle 3 of her exemestane/Palbociclib therapy. She initially received 158m palbociclib x 21 days, but the dose was reduced to 75 mg X 21 days beginning with cycle 3. Dose was reduced due to neutropenia.   REVIEW OF SYSTEMS: CAdilynnis tolerating her therapy well. The exemestane causes occasional hot flashes that are of no concern to her. She denies vaginal dryness. She denies fevers, chills, nausea, vomiting, or changes in bowel habits. A detailed review of systems was otherwise noncontributory.   PAST MEDICAL HISTORY: Past Medical  History  Diagnosis Date  . Breast cancer   . Depression   . Reflux   . Cancer   . Hx-TIA (transient ischemic attack) 11/22/2013  . Chronic headaches  11/22/2013  . Anxiety 11/22/2013  . Breast cancer metastasized to multiple sites 12/24/2013    PAST SURGICAL HISTORY: Past Surgical History  Procedure Laterality Date  . Mastectomy w/ nodes partial    . Mastectomy    . Breast reconstruction    . Portacath placement      x 2  . Portacath removal      x 2  . Abdominal hysterectomy    . Tee without cardioversion  11/12/2012    Procedure: TRANSESOPHAGEAL ECHOCARDIOGRAM (TEE);  Surgeon: Candee Furbish, MD;  Location: [facility] ENDOSCOPY;  Service: Cardiovascular;  Laterality: N/A;  This TEE may be Dr. Marlou Porch or a LHC Dr    FAMILY HISTORY Both parents are alive and well. Patient has one sister who is 105 years younger and is in good health. No other history of breast or ovarian cancer in the family. History reviewed. No pertinent family history.  GYNECOLOGIC HISTORY:   (Updated January 2015) G2P2, menarche at age 34 with irregular menses. On birth control pills in the past. On Clomid to induce ovulation with first pregnancy. Also had preeclampsia with first pregnancy. Status post hysterectomy and bilateral salpingo-oophorectomy in August 2009.  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 She is originally from [place]. She's been married to [place], for 13 years. He works as an Pharmacist, hospital at [employer].   ADVANCED DIRECTIVES:    HEALTH MAINTENANCE:  (Updated 11/22/2013) History  Substance Use Topics  . Smoking status: Never Smoker   . Smokeless tobacco: Never Used  . Alcohol Use: Yes     Comment: rarely     Colonoscopy:  Never  PAP: S/P LAVH/BSO in August 2009  Bone density: Never  Lipid panel: Not on file   No Known Allergies  Current Outpatient Prescriptions  Medication Sig Dispense Refill  . aspirin 81 MG tablet Take 81 mg by mouth daily.      . cetirizine (ZYRTEC) 10 MG tablet Take 10 mg by mouth daily.        . Cholecalciferol  10000 UNITS CAPS Take 1 capsule by mouth daily.      Marland Kitchen escitalopram (LEXAPRO) 20 MG tablet Take 20 mg by mouth daily with breakfast.       . exemestane (AROMASIN) 25 MG tablet       . LORazepam (ATIVAN) 0.5 MG tablet Take 1 mg by mouth every 4 (four) hours as needed for anxiety.      . Multiple Vitamin (MULTIVITAMIN) tablet Take 1 tablet by mouth daily.      Marland Kitchen omeprazole (PRILOSEC) 40 MG capsule Take 40 mg by mouth daily.       . palbociclib (IBRANCE) 75 MG capsule Take whole with food 21 days then off x 7.  21 capsule  3  . Pseudoephedrine HCl (SUDAFED 12 HOUR PO) Take 1 tablet by mouth 2 (two) times daily.       No current facility-administered medications for this visit.    OBJECTIVE: Young white woman who appears well Filed Vitals:   09/05/14 0935  BP: 133/78  Pulse: 78  Temp: 98.5 F (36.9 C)  Resp: 19  Body mass index is 33.2 kg/(m^2).  ECOG: 1 Filed Weights  09/05/14 0935  Weight: 193 lb 8 oz (87.771 kg)   Skin: warm, dry, small red lesion to upper right quadrant appears to resolving, no exudate noted. HEENT: sclerae anicteric, conjunctivae pink, oropharynx clear. No thrush or mucositis.  Lymph Nodes: No cervical or supraclavicular lymphadenopathy  Lungs: clear to auscultation bilaterally, no rales, wheezes, or rhonci  Heart: regular rate and rhythm  Abdomen: round, soft, non tender, positive bowel sounds  Musculoskeletal: No focal spinal tenderness, no peripheral edema  Neuro: non focal, well oriented, positive affect  Breasts: deferred  LAB RESULTS:   Lab Results  Component Value Date   WBC 2.8* 09/05/2014   NEUTROABS 1.7 09/05/2014   HGB 13.3 09/05/2014   HCT 41.3 09/05/2014   MCV 98.5 09/05/2014   PLT 190 09/05/2014      Chemistry      Component Value Date/Time   NA 142 09/05/2014 0926   NA 142 11/12/2012 0735   K 4.0 09/05/2014 0926   K 3.8 11/12/2012 0735   CL 104 11/14/2012 1407   CL 104 11/12/2012 0735   CO2 26 09/05/2014 0926   CO2 30 11/12/2012 0735    BUN 14.0 09/05/2014 0926   BUN 13 11/12/2012 0735   CREATININE 1.1 09/05/2014 0926   CREATININE 0.97 11/12/2012 0735      Component Value Date/Time   CALCIUM 9.6 09/05/2014 0926   CALCIUM 9.1 11/12/2012 0735   ALKPHOS 50 09/05/2014 0926   ALKPHOS 69 11/12/2012 0735   AST 22 09/05/2014 0926   AST 16 11/12/2012 0735   ALT 12 09/05/2014 0926   ALT 13 11/12/2012 0735   BILITOT 0.80 09/05/2014 0926   BILITOT 0.9 11/12/2012 0735      STUDIES: No results found.  ASSESSMENT: 48 y.o. [place] woman  (1)  status post left lumpectomy under the care of Dr. 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 2 of 23 lymph nodes were involved.  Margins were clear.  (2) status post adjuvant chemotherapy consisting of 6 q. three-week doses of docetaxel/doxorubicin/cyclophosphamide given between January 2007 and may of 2007, with last dose on 04/20/2006.  (3) status post radiation therapy to the left breast, completed 07/11/2006  (4) began on tamoxifen in early August 2007 and continued until October 2009. She status post hysterectomy with bilateral salpingo-oophorectomy on 07/15/2008, and was started on letrozole in October of 2009.  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 PET and CT of the chest showed no evidence of metastasis in January 2011.  (6) status post bilateral mastectomies 01/25/2010, with the right breast showing no evidence of malignancy; in the left breast showing a 1.0 cm grade 2 invasive ductal carcinoma with high-grade DCIS. Tumor was ER +22%, PR negative, HER-2/neu negative, with proliferation marker of 13%. Margins were clear. Patient underwent concurrent left latissimus flap reconstruction and right implant reconstruction.  (7)  status post additional chemotherapy with one  cycle of carboplatin/gemcitabine given on 03/11/2010. She then received 5 q. three-week cycles of IV CMF between 03/25/2010 and 06/17/2010.  (8) started on exemestane in January 2012 and continued until late November 2013 when she was hospitalized for apparent TIA at which time her exemestane was stopped and not resumed  (9) METASTATIC DISEASE: evidence of disease recurence noted on chest CT, liver MRI and PET scan in December 2014, with  suspicious lung nodules, lymphadenopathy, and 3 lesions in the right lobe of the liver, but no evidence of bony disease and no evidence of brain involvement by brain MRI September 2014. Biopsy of a left supraclavicular lymph node on 12/11/2013 confirmed metastatic carcinoma, estrogen receptor 45% positive, progesterone receptor 17% positive, with no HER-2 amplification.  (10) fulvestrant started 12/03/2013, discontinued 03/25/2014 with progression  (11) exemestane/ everolimus started 04/04/2014; everolimus held 04/14/2014, with skin rash and mouth soreness; resumed at 5 mg a day as of 04/29/2014, discontinued 05/09/2014 with similar symptoms  (12) continueing exemestane, adding palbociclib 05/26/2014;   (a) palbociclib dose dropped to 75 mg daily for 21 days begin with cycle 3  (13) [facility] referral placed 04/28/2049 -- re-sent 05/23/2014   PLAN: Mary is doing well on the exemestane and palbociclib with no complaints. Her ANC is normal today, but she still has a 1 week left on her third cycle of palbociclib at 35m x 21 days starting next week. She will complete this cycle next week and then have one week off of her treatment. She is due to begin her fourth cycle on October 9. She will have weekly labs and we will give her further instructions regarding the start of her fourth cycle on October 9. We will plan to have her return for a visit at the beginning of cycle 5 which will be on 10/17/2014.  We will obtain a repeat MRI of the liver to serve as a new baseline  when we find a dose of palbociclib that she can tolerate.   [facility] I have spoken with Dr. MJana Hakimnurse, Val, and she will followup on this request.  Alejandro understands and agrees with this plan. She knows the goal of treatment in her case is control. She has been encouraged to call with any issues that might arise before her next visit here.   CMikey Bussing NP   09/05/2014 3:45 PM

## 2014-09-12 ENCOUNTER — Other Ambulatory Visit (HOSPITAL_BASED_OUTPATIENT_CLINIC_OR_DEPARTMENT_OTHER): Payer: BC Managed Care – PPO

## 2014-09-12 DIAGNOSIS — Z8673 Personal history of transient ischemic attack (TIA), and cerebral infarction without residual deficits: Secondary | ICD-10-CM

## 2014-09-12 DIAGNOSIS — R519 Headache, unspecified: Secondary | ICD-10-CM

## 2014-09-12 DIAGNOSIS — C50919 Malignant neoplasm of unspecified site of unspecified female breast: Secondary | ICD-10-CM

## 2014-09-12 DIAGNOSIS — R51 Headache: Secondary | ICD-10-CM

## 2014-09-12 DIAGNOSIS — C50312 Malignant neoplasm of lower-inner quadrant of left female breast: Secondary | ICD-10-CM

## 2014-09-12 LAB — CBC WITH DIFFERENTIAL/PLATELET
BASO%: 1.1 % (ref 0.0–2.0)
Basophils Absolute: 0 10*3/uL (ref 0.0–0.1)
EOS%: 1.3 % (ref 0.0–7.0)
Eosinophils Absolute: 0 10*3/uL (ref 0.0–0.5)
HEMATOCRIT: 40.1 % (ref 34.8–46.6)
HGB: 13 g/dL (ref 11.6–15.9)
LYMPH%: 40.7 % (ref 14.0–49.7)
MCH: 31.9 pg (ref 25.1–34.0)
MCHC: 32.4 g/dL (ref 31.5–36.0)
MCV: 98.6 fL (ref 79.5–101.0)
MONO#: 0.1 10*3/uL (ref 0.1–0.9)
MONO%: 6.1 % (ref 0.0–14.0)
NEUT#: 1.2 10*3/uL — ABNORMAL LOW (ref 1.5–6.5)
NEUT%: 50.8 % (ref 38.4–76.8)
Platelets: 139 10*3/uL — ABNORMAL LOW (ref 145–400)
RBC: 4.07 10*6/uL (ref 3.70–5.45)
RDW: 18.1 % — ABNORMAL HIGH (ref 11.2–14.5)
WBC: 2.3 10*3/uL — ABNORMAL LOW (ref 3.9–10.3)
lymph#: 0.9 10*3/uL (ref 0.9–3.3)

## 2014-09-19 ENCOUNTER — Ambulatory Visit (HOSPITAL_BASED_OUTPATIENT_CLINIC_OR_DEPARTMENT_OTHER): Payer: BC Managed Care – PPO

## 2014-09-19 DIAGNOSIS — Z8673 Personal history of transient ischemic attack (TIA), and cerebral infarction without residual deficits: Secondary | ICD-10-CM

## 2014-09-19 DIAGNOSIS — C50312 Malignant neoplasm of lower-inner quadrant of left female breast: Secondary | ICD-10-CM

## 2014-09-19 DIAGNOSIS — C50919 Malignant neoplasm of unspecified site of unspecified female breast: Secondary | ICD-10-CM

## 2014-09-19 DIAGNOSIS — R519 Headache, unspecified: Secondary | ICD-10-CM

## 2014-09-19 DIAGNOSIS — R51 Headache: Secondary | ICD-10-CM

## 2014-09-19 LAB — COMPREHENSIVE METABOLIC PANEL (CC13)
ALK PHOS: 56 U/L (ref 40–150)
ALT: 13 U/L (ref 0–55)
AST: 21 U/L (ref 5–34)
Albumin: 4 g/dL (ref 3.5–5.0)
Anion Gap: 7 mEq/L (ref 3–11)
BILIRUBIN TOTAL: 0.55 mg/dL (ref 0.20–1.20)
BUN: 12.9 mg/dL (ref 7.0–26.0)
CO2: 28 mEq/L (ref 22–29)
CREATININE: 0.9 mg/dL (ref 0.6–1.1)
Calcium: 9.5 mg/dL (ref 8.4–10.4)
Chloride: 108 mEq/L (ref 98–109)
Glucose: 91 mg/dl (ref 70–140)
Potassium: 3.9 mEq/L (ref 3.5–5.1)
SODIUM: 142 meq/L (ref 136–145)
TOTAL PROTEIN: 7 g/dL (ref 6.4–8.3)

## 2014-09-19 LAB — CBC WITH DIFFERENTIAL/PLATELET
BASO%: 1.4 % (ref 0.0–2.0)
Basophils Absolute: 0 10*3/uL (ref 0.0–0.1)
EOS%: 1.1 % (ref 0.0–7.0)
Eosinophils Absolute: 0 10*3/uL (ref 0.0–0.5)
HEMATOCRIT: 38.2 % (ref 34.8–46.6)
HGB: 12.6 g/dL (ref 11.6–15.9)
LYMPH#: 1.2 10*3/uL (ref 0.9–3.3)
LYMPH%: 43 % (ref 14.0–49.7)
MCH: 32.5 pg (ref 25.1–34.0)
MCHC: 33 g/dL (ref 31.5–36.0)
MCV: 98.5 fL (ref 79.5–101.0)
MONO#: 0.3 10*3/uL (ref 0.1–0.9)
MONO%: 9.9 % (ref 0.0–14.0)
NEUT#: 1.2 10*3/uL — ABNORMAL LOW (ref 1.5–6.5)
NEUT%: 44.6 % (ref 38.4–76.8)
PLATELETS: 195 10*3/uL (ref 145–400)
RBC: 3.87 10*6/uL (ref 3.70–5.45)
RDW: 19.2 % — ABNORMAL HIGH (ref 11.2–14.5)
WBC: 2.7 10*3/uL — AB (ref 3.9–10.3)

## 2014-09-19 NOTE — Progress Notes (Signed)
Per lab value showing [place] of 1.2 informed pt to continue to hold Ibrance - lab will be given to MD for review per return to the office on 10/12 for further recommendation.

## 2014-09-26 ENCOUNTER — Other Ambulatory Visit (HOSPITAL_BASED_OUTPATIENT_CLINIC_OR_DEPARTMENT_OTHER): Payer: BC Managed Care – PPO

## 2014-09-26 DIAGNOSIS — Z8673 Personal history of transient ischemic attack (TIA), and cerebral infarction without residual deficits: Secondary | ICD-10-CM

## 2014-09-26 DIAGNOSIS — C50312 Malignant neoplasm of lower-inner quadrant of left female breast: Secondary | ICD-10-CM

## 2014-09-26 DIAGNOSIS — R519 Headache, unspecified: Secondary | ICD-10-CM

## 2014-09-26 DIAGNOSIS — R51 Headache: Secondary | ICD-10-CM

## 2014-09-26 DIAGNOSIS — C50919 Malignant neoplasm of unspecified site of unspecified female breast: Secondary | ICD-10-CM

## 2014-09-26 LAB — CBC WITH DIFFERENTIAL/PLATELET
BASO%: 2 % (ref 0.0–2.0)
BASOS ABS: 0.1 10*3/uL (ref 0.0–0.1)
EOS%: 1.2 % (ref 0.0–7.0)
Eosinophils Absolute: 0 10*3/uL (ref 0.0–0.5)
HEMATOCRIT: 40.5 % (ref 34.8–46.6)
HEMOGLOBIN: 13.3 g/dL (ref 11.6–15.9)
LYMPH%: 31.1 % (ref 14.0–49.7)
MCH: 32.1 pg (ref 25.1–34.0)
MCHC: 32.9 g/dL (ref 31.5–36.0)
MCV: 97.6 fL (ref 79.5–101.0)
MONO#: 0.4 10*3/uL (ref 0.1–0.9)
MONO%: 10.9 % (ref 0.0–14.0)
NEUT#: 2 10*3/uL (ref 1.5–6.5)
NEUT%: 54.8 % (ref 38.4–76.8)
PLATELETS: 260 10*3/uL (ref 145–400)
RBC: 4.15 10*6/uL (ref 3.70–5.45)
RDW: 17.9 % — ABNORMAL HIGH (ref 11.2–14.5)
WBC: 3.6 10*3/uL — ABNORMAL LOW (ref 3.9–10.3)
lymph#: 1.1 10*3/uL (ref 0.9–3.3)

## 2014-09-26 LAB — COMPREHENSIVE METABOLIC PANEL (CC13)
ALT: 20 U/L (ref 0–55)
AST: 29 U/L (ref 5–34)
Albumin: 4.3 g/dL (ref 3.5–5.0)
Alkaline Phosphatase: 56 U/L (ref 40–150)
Anion Gap: 9 mEq/L (ref 3–11)
BUN: 10.9 mg/dL (ref 7.0–26.0)
CO2: 27 meq/L (ref 22–29)
Calcium: 9.8 mg/dL (ref 8.4–10.4)
Chloride: 107 mEq/L (ref 98–109)
Creatinine: 1.2 mg/dL — ABNORMAL HIGH (ref 0.6–1.1)
Glucose: 92 mg/dl (ref 70–140)
Potassium: 3.8 mEq/L (ref 3.5–5.1)
Sodium: 143 mEq/L (ref 136–145)
TOTAL PROTEIN: 6.8 g/dL (ref 6.4–8.3)
Total Bilirubin: 0.8 mg/dL (ref 0.20–1.20)

## 2014-10-03 ENCOUNTER — Other Ambulatory Visit (HOSPITAL_BASED_OUTPATIENT_CLINIC_OR_DEPARTMENT_OTHER): Payer: BC Managed Care – PPO

## 2014-10-03 DIAGNOSIS — C50312 Malignant neoplasm of lower-inner quadrant of left female breast: Secondary | ICD-10-CM

## 2014-10-03 DIAGNOSIS — C50919 Malignant neoplasm of unspecified site of unspecified female breast: Secondary | ICD-10-CM

## 2014-10-03 DIAGNOSIS — Z8673 Personal history of transient ischemic attack (TIA), and cerebral infarction without residual deficits: Secondary | ICD-10-CM

## 2014-10-03 DIAGNOSIS — R51 Headache: Secondary | ICD-10-CM

## 2014-10-03 DIAGNOSIS — R519 Headache, unspecified: Secondary | ICD-10-CM

## 2014-10-03 LAB — CBC WITH DIFFERENTIAL/PLATELET
BASO%: 0.6 % (ref 0.0–2.0)
Basophils Absolute: 0 10*3/uL (ref 0.0–0.1)
EOS%: 1.4 % (ref 0.0–7.0)
Eosinophils Absolute: 0.1 10*3/uL (ref 0.0–0.5)
HEMATOCRIT: 40.3 % (ref 34.8–46.6)
HGB: 13.3 g/dL (ref 11.6–15.9)
LYMPH#: 1 10*3/uL (ref 0.9–3.3)
LYMPH%: 29.6 % (ref 14.0–49.7)
MCH: 32.2 pg (ref 25.1–34.0)
MCHC: 33 g/dL (ref 31.5–36.0)
MCV: 97.6 fL (ref 79.5–101.0)
MONO#: 0.3 10*3/uL (ref 0.1–0.9)
MONO%: 8.6 % (ref 0.0–14.0)
NEUT#: 2.1 10*3/uL (ref 1.5–6.5)
NEUT%: 59.8 % (ref 38.4–76.8)
Platelets: 244 10*3/uL (ref 145–400)
RBC: 4.13 10*6/uL (ref 3.70–5.45)
RDW: 15.4 % — ABNORMAL HIGH (ref 11.2–14.5)
WBC: 3.5 10*3/uL — AB (ref 3.9–10.3)

## 2014-10-03 LAB — COMPREHENSIVE METABOLIC PANEL (CC13)
ALBUMIN: 3.8 g/dL (ref 3.5–5.0)
ALT: 21 U/L (ref 0–55)
AST: 23 U/L (ref 5–34)
Alkaline Phosphatase: 54 U/L (ref 40–150)
Anion Gap: 7 mEq/L (ref 3–11)
BUN: 14 mg/dL (ref 7.0–26.0)
CALCIUM: 9.4 mg/dL (ref 8.4–10.4)
CHLORIDE: 108 meq/L (ref 98–109)
CO2: 27 meq/L (ref 22–29)
Creatinine: 1.1 mg/dL (ref 0.6–1.1)
Glucose: 94 mg/dl (ref 70–140)
Potassium: 4 mEq/L (ref 3.5–5.1)
Sodium: 143 mEq/L (ref 136–145)
TOTAL PROTEIN: 6.5 g/dL (ref 6.4–8.3)
Total Bilirubin: 0.84 mg/dL (ref 0.20–1.20)

## 2014-10-10 ENCOUNTER — Other Ambulatory Visit (HOSPITAL_BASED_OUTPATIENT_CLINIC_OR_DEPARTMENT_OTHER): Payer: BC Managed Care – PPO

## 2014-10-10 DIAGNOSIS — Z8673 Personal history of transient ischemic attack (TIA), and cerebral infarction without residual deficits: Secondary | ICD-10-CM

## 2014-10-10 DIAGNOSIS — R51 Headache: Secondary | ICD-10-CM

## 2014-10-10 DIAGNOSIS — C50919 Malignant neoplasm of unspecified site of unspecified female breast: Secondary | ICD-10-CM

## 2014-10-10 DIAGNOSIS — C50312 Malignant neoplasm of lower-inner quadrant of left female breast: Secondary | ICD-10-CM

## 2014-10-10 DIAGNOSIS — R519 Headache, unspecified: Secondary | ICD-10-CM

## 2014-10-10 LAB — CBC WITH DIFFERENTIAL/PLATELET
BASO%: 0.8 % (ref 0.0–2.0)
Basophils Absolute: 0 10*3/uL (ref 0.0–0.1)
EOS%: 2.5 % (ref 0.0–7.0)
Eosinophils Absolute: 0.1 10*3/uL (ref 0.0–0.5)
HCT: 39.9 % (ref 34.8–46.6)
HGB: 13.3 g/dL (ref 11.6–15.9)
LYMPH#: 1.1 10*3/uL (ref 0.9–3.3)
LYMPH%: 30.9 % (ref 14.0–49.7)
MCH: 32.7 pg (ref 25.1–34.0)
MCHC: 33.3 g/dL (ref 31.5–36.0)
MCV: 98 fL (ref 79.5–101.0)
MONO#: 0.4 10*3/uL (ref 0.1–0.9)
MONO%: 10 % (ref 0.0–14.0)
NEUT#: 2 10*3/uL (ref 1.5–6.5)
NEUT%: 55.8 % (ref 38.4–76.8)
Platelets: 234 10*3/uL (ref 145–400)
RBC: 4.07 10*6/uL (ref 3.70–5.45)
RDW: 15.4 % — AB (ref 11.2–14.5)
WBC: 3.6 10*3/uL — ABNORMAL LOW (ref 3.9–10.3)

## 2014-10-10 LAB — COMPREHENSIVE METABOLIC PANEL (CC13)
ALT: 17 U/L (ref 0–55)
AST: 23 U/L (ref 5–34)
Albumin: 4 g/dL (ref 3.5–5.0)
Alkaline Phosphatase: 54 U/L (ref 40–150)
Anion Gap: 10 mEq/L (ref 3–11)
BUN: 11.4 mg/dL (ref 7.0–26.0)
CALCIUM: 9.3 mg/dL (ref 8.4–10.4)
CHLORIDE: 108 meq/L (ref 98–109)
CO2: 26 mEq/L (ref 22–29)
Creatinine: 1.1 mg/dL (ref 0.6–1.1)
Glucose: 69 mg/dl — ABNORMAL LOW (ref 70–140)
POTASSIUM: 3.6 meq/L (ref 3.5–5.1)
SODIUM: 144 meq/L (ref 136–145)
Total Bilirubin: 0.79 mg/dL (ref 0.20–1.20)
Total Protein: 6.8 g/dL (ref 6.4–8.3)

## 2014-10-16 ENCOUNTER — Other Ambulatory Visit: Payer: Self-pay | Admitting: Oncology

## 2014-10-16 DIAGNOSIS — C50919 Malignant neoplasm of unspecified site of unspecified female breast: Secondary | ICD-10-CM

## 2014-10-17 ENCOUNTER — Other Ambulatory Visit (HOSPITAL_BASED_OUTPATIENT_CLINIC_OR_DEPARTMENT_OTHER): Payer: BC Managed Care – PPO

## 2014-10-17 ENCOUNTER — Ambulatory Visit (HOSPITAL_BASED_OUTPATIENT_CLINIC_OR_DEPARTMENT_OTHER): Payer: BC Managed Care – PPO | Admitting: Nurse Practitioner

## 2014-10-17 ENCOUNTER — Encounter: Payer: Self-pay | Admitting: Nurse Practitioner

## 2014-10-17 VITALS — BP 144/88 | HR 74 | Temp 98.4°F | Resp 20 | Ht 64.0 in | Wt 191.5 lb

## 2014-10-17 DIAGNOSIS — C50919 Malignant neoplasm of unspecified site of unspecified female breast: Secondary | ICD-10-CM

## 2014-10-17 DIAGNOSIS — C770 Secondary and unspecified malignant neoplasm of lymph nodes of head, face and neck: Secondary | ICD-10-CM

## 2014-10-17 DIAGNOSIS — G8929 Other chronic pain: Secondary | ICD-10-CM

## 2014-10-17 DIAGNOSIS — Z8673 Personal history of transient ischemic attack (TIA), and cerebral infarction without residual deficits: Secondary | ICD-10-CM

## 2014-10-17 DIAGNOSIS — C50312 Malignant neoplasm of lower-inner quadrant of left female breast: Secondary | ICD-10-CM

## 2014-10-17 DIAGNOSIS — R51 Headache: Secondary | ICD-10-CM

## 2014-10-17 DIAGNOSIS — Z170 Estrogen receptor positive status [ER+]: Secondary | ICD-10-CM

## 2014-10-17 DIAGNOSIS — C50912 Malignant neoplasm of unspecified site of left female breast: Secondary | ICD-10-CM

## 2014-10-17 LAB — CBC WITH DIFFERENTIAL/PLATELET
BASO%: 0.6 % (ref 0.0–2.0)
BASOS ABS: 0 10*3/uL (ref 0.0–0.1)
EOS%: 1.6 % (ref 0.0–7.0)
Eosinophils Absolute: 0.1 10*3/uL (ref 0.0–0.5)
HCT: 38.9 % (ref 34.8–46.6)
HGB: 13 g/dL (ref 11.6–15.9)
LYMPH%: 33.5 % (ref 14.0–49.7)
MCH: 32.4 pg (ref 25.1–34.0)
MCHC: 33.4 g/dL (ref 31.5–36.0)
MCV: 97 fL (ref 79.5–101.0)
MONO#: 0.2 10*3/uL (ref 0.1–0.9)
MONO%: 6.3 % (ref 0.0–14.0)
NEUT#: 1.9 10*3/uL (ref 1.5–6.5)
NEUT%: 58 % (ref 38.4–76.8)
PLATELETS: 183 10*3/uL (ref 145–400)
RBC: 4.01 10*6/uL (ref 3.70–5.45)
RDW: 15.2 % — AB (ref 11.2–14.5)
WBC: 3.2 10*3/uL — ABNORMAL LOW (ref 3.9–10.3)
lymph#: 1.1 10*3/uL (ref 0.9–3.3)

## 2014-10-17 NOTE — Progress Notes (Signed)
ID: Kristin Hoffman OB: Aug 24, 1966  MR#: [id number]  ZDG#:[id number]  PCP: Kristin Leach, MD GYN:  Kristin Hoffman SU: Kristin Hoffman OTHER MD: Kristin Hoffman  CHIEF COMPLAINT:  Stage IV breast cancer CURRENT TREATMENT: exemestane and palbociclib  BREAST CANCER HISTORY: This patient was previously followed by Kristin Hoffman, and was transferred to Kristin Hoffman service as of 08/20/2013.  At the age of 16, the patient had a screening mammogram as a baseline. She had recently given birth and was on birth control pills at that time. The mammogram in November 2006 showed an area of architectural distortion in the left lower inner quadrant. Subsequently an ultrasound was obtained of the left breast showing a 2.0 x 1.5 x 1.4 cm mass, at the 8:00 position, 4 cm from the nipple. A core biopsy performed on 10/21/2005 showed an invasive mammary carcinoma, ER +70%, PR +41%, HER-2/neu negative, with proliferation marker of 38%. [phone])  Breast MRI on 11/08/2005 confirmed a 2.5 cm spiculated mass in the left lower inner quadrant with 3 small satellite nodules adjacent to the primary mass: 3.5 cm posterior medial to the primary mass, measuring 9 mm; 1.5 cm anterior medial to the primary mass measuring 5 mm; and 2 cm medial to the primary mass measuring 5 mm. No axillary adenopathy was noted. No suspicious masses or enhancement are noted in the right breast.  Kristin Hoffman underwentt left lumpectomy under the care of Kristin Hoffman on 11/18/2005 for a 2.5 cm grade 3 invasive ductal carcinoma, ER +70%, PR +41%, HER-2/neu negative, with proliferation marker of 38%. 2 of 23 lymph nodes were involved.  Margins were clear.  She received adjuvant chemotherapy consisting of 6 q. three-week doses of docetaxel/doxorubicin/cyclophosphamide given between January 2007 and may of 2007, with last dose on 04/20/2006. She underwent radiation therapy completed 07/11/2006, after which she began on tamoxifen in early August 2007.  She  underwent hysterectomy with bilateral salpingo-oophorectomy on 07/15/2008, and was started on letrozole in October of 2009.  A mammogram 11/10/2009 showed microcalcifications in the left breast, and a subsequent biopsy on 11/18/2009 confirmed invasive mammary carcinoma in the left breast. PET and CT of the chest showed no evidence of metastasis in January 2011.  She underwent bilateral mastectomies 01/25/2010, with the right breast showing no evidence of malignancy; in the left breast showing a 1.0 cm grade 2 invasive ductal carcinoma with high-grade DCIS. Tumor was ER +22%, PR negative, HER-2/neu negative, with proliferation marker of 13%. Margins were clear. Patient underwent concurrent left latissimus flap reconstruction and right implant reconstruction.  She received additional chemotherapy with one cycle of carboplatin/gemcitabine given on 03/11/2010. She then received 5 q. three-week cycles of IV CMF between 03/25/2010 and 06/17/2010.  She was started on exemestane in January 2012 and continued until late November 2013 when she was hospitalized for apparent TIA.   Her subsequent history is as detailed below  INTERVAL HISTORY: Kristin Hoffman returns today for followup of her metastatic breast cancer. Today is day 21 of her exemestane/Palbociclib therapy. Her dosage of the palbociclib is now down to 49m every other day x21 days, then 7 days off.  She is tolerating both drugs with few complaints. She has some mild hot flashes, but she manages these on her own. She denies vaginal dryness.   REVIEW OF SYSTEMS: She denies fevers, chills, nausea, vomiting, or changes in bowel habits. She has no rash, mouth sores, headaches, or dizziness. She denies shortness of breath, chest pain, cough, or palpitations.  A detailed review of  systems was otherwise noncontributory.   PAST MEDICAL HISTORY: Past Medical History  Diagnosis Date  . Breast cancer   . Depression   . Reflux   . Cancer   . Hx-TIA (transient  ischemic attack) 11/22/2013  . Chronic headaches 11/22/2013  . Anxiety 11/22/2013  . Breast cancer metastasized to multiple sites 12/24/2013    PAST SURGICAL HISTORY: Past Surgical History  Procedure Laterality Date  . Mastectomy w/ nodes partial    . Mastectomy    . Breast reconstruction    . Portacath placement      x 2  . Portacath removal      x 2  . Abdominal hysterectomy    . Tee without cardioversion  11/12/2012    Procedure: TRANSESOPHAGEAL ECHOCARDIOGRAM (TEE);  Surgeon: Candee Furbish, MD;  Location: [facility] ENDOSCOPY;  Service: Cardiovascular;  Laterality: N/A;  This TEE may be Dr. Marlou Porch or a LHC Dr    FAMILY HISTORY Both parents are alive and well. Patient has one sister who is 62 years younger and is in good health. No other history of breast or ovarian cancer in the family. No family history on file.  GYNECOLOGIC HISTORY:   (Updated January 2015) G2P2, menarche at age 66 with irregular menses. On birth control pills in the past. On Clomid to induce ovulation with first pregnancy. Also had preeclampsia with first pregnancy. Status post hysterectomy and bilateral salpingo-oophorectomy in August 2009.  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 She is originally from [place]. She's been married to Kristin Hoffman, for 13 years. He works as an Pharmacist, hospital at [employer].   ADVANCED DIRECTIVES:    HEALTH MAINTENANCE:  (Updated 11/22/2013) History  Substance Use Topics  . Smoking status: Never Smoker   . Smokeless tobacco: Never Used  . Alcohol Use: Yes     Comment: rarely     Colonoscopy:  Never  PAP: S/P LAVH/BSO in August 2009  Bone density: Never  Lipid panel: Not on file   No Known Allergies  Current Outpatient Prescriptions  Medication Sig Dispense Refill  . aspirin 81 MG tablet Take 81 mg by mouth daily.    . cetirizine (ZYRTEC) 10 MG tablet Take 10 mg by mouth daily.       . Cholecalciferol 2000 UNITS CHEW Chew 1 tablet by mouth daily.    Marland Kitchen escitalopram (LEXAPRO) 20 MG tablet Take 20 mg by mouth daily with breakfast.     . exemestane (AROMASIN) 25 MG tablet     . LORazepam (ATIVAN) 0.5 MG tablet Take 1 mg by mouth at bedtime.     . montelukast (SINGULAIR) 10 MG tablet Take 10 mg by mouth daily.    . Multiple Vitamin (MULTIVITAMIN) tablet Take 1 tablet by mouth daily.    Marland Kitchen omeprazole (PRILOSEC) 40 MG capsule Take 40 mg by mouth daily.     . palbociclib (IBRANCE) 75 MG capsule Take whole with food 21 days then off x 7. (Patient taking differently: Take whole with food every other day for 21 days, then off for 7 days.) 21 capsule 3   No current facility-administered medications for this visit.    OBJECTIVE: Young white woman who appears well Filed Vitals:   10/17/14 0940  BP: 144/88  Pulse: 74  Temp: 98.4 F (36.9 C)  Resp: 20  Body mass index is 32.85 kg/(m^2).  ECOG: 1 Filed Weights  10/17/14 0940  Weight: 191 lb 8 oz (86.864 kg)   Sclerae unicteric, pupils equal and reactive Oropharynx clear and moist-- no thrush No cervical or supraclavicular adenopathy Lungs no rales or rhonchi Heart regular rate and rhythm Abd soft, nontender, positive bowel sounds MSK no focal spinal tenderness, no upper extremity lymphedema Neuro: nonfocal, well oriented, appropriate affect Breasts: deferred  LAB RESULTS:   Lab Results  Component Value Date   WBC 3.2* 10/17/2014   NEUTROABS 1.9 10/17/2014   HGB 13.0 10/17/2014   HCT 38.9 10/17/2014   MCV 97.0 10/17/2014   PLT 183 10/17/2014      Chemistry      Component Value Date/Time   NA 144 10/10/2014 0931   NA 142 11/12/2012 0735   K 3.6 10/10/2014 0931   K 3.8 11/12/2012 0735   CL 104 11/14/2012 1407   CL 104 11/12/2012 0735   CO2 26 10/10/2014 0931   CO2 30 11/12/2012 0735   BUN 11.4 10/10/2014 0931   BUN 13 11/12/2012 0735   CREATININE 1.1 10/10/2014 0931   CREATININE 0.97 11/12/2012 0735       Component Value Date/Time   CALCIUM 9.3 10/10/2014 0931   CALCIUM 9.1 11/12/2012 0735   ALKPHOS 54 10/10/2014 0931   ALKPHOS 69 11/12/2012 0735   AST 23 10/10/2014 0931   AST 16 11/12/2012 0735   ALT 17 10/10/2014 0931   ALT 13 11/12/2012 0735   BILITOT 0.79 10/10/2014 0931   BILITOT 0.9 11/12/2012 0735      STUDIES: No results found.  ASSESSMENT: 48 y.o. 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 2 of 23 lymph nodes were involved.  Margins were clear.  (2) status post adjuvant chemotherapy consisting of 6 q. three-week doses of docetaxel/doxorubicin/cyclophosphamide given between January 2007 and may of 2007, with last dose on 04/20/2006.  (3) status post radiation therapy to the left breast, completed 07/11/2006  (4) began on tamoxifen in early August 2007 and continued until October 2009. She status post hysterectomy with bilateral salpingo-oophorectomy on 07/15/2008, and was started on letrozole in October of 2009.  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 PET and CT of the chest showed no evidence of metastasis in January 2011.  (6) status post bilateral mastectomies 01/25/2010, with the right breast showing no evidence of malignancy; in the left breast showing a 1.0 cm grade 2 invasive ductal carcinoma with high-grade DCIS. Tumor was ER +22%, PR negative, HER-2/neu negative, with proliferation marker of 13%. Margins were clear. Patient underwent concurrent left latissimus flap reconstruction and right implant reconstruction.  (7)  status post additional chemotherapy with one cycle of carboplatin/gemcitabine given on 03/11/2010. She then received 5 q. three-week cycles of IV CMF  between 03/25/2010 and 06/17/2010.  (8) started on exemestane in January 2012 and continued until late November 2013 when she was hospitalized for apparent TIA at which time her exemestane was stopped and not resumed  (9) METASTATIC DISEASE: evidence of disease recurence noted on chest CT, liver MRI and PET scan in December 2014, with suspicious lung nodules, lymphadenopathy, and 3 lesions in the right lobe of the liver, but no evidence of bony disease and no evidence of brain involvement by brain MRI September 2014.  Biopsy of a left supraclavicular lymph node on 12/11/2013 confirmed metastatic carcinoma, estrogen receptor 45% positive, progesterone receptor 17% positive, with no HER-2 amplification.  (10) fulvestrant started 12/03/2013, discontinued 03/25/2014 with progression  (11) exemestane/ everolimus started 04/04/2014; everolimus held 04/14/2014, with skin rash and mouth soreness; resumed at 5 mg a day as of 04/29/2014, discontinued 05/09/2014 with similar symptoms  (12) continueing exemestane, adding palbociclib 05/26/2014;   (a) palbociclib dose dropped to 75 mg every other day for 21 days beginning with cycle 4  (13) [facility] referral placed 04/28/2049 -- re-sent 05/23/2014   PLAN:  Mikaylah continues to do well on the exemestane and palbociclib. The labs were reviewed in detail and were entirely stable. Her [place] held steady at 1.9. She will start cycle 5 of palbociclib next Friday as scheduled, and will of course continue on the exemestane daily.  Zaila will continue weekly labs. Her MRI and PET scan are scheduled for 12/28. She will meet with [name] on 12/30 to discuss the results. She understands and agrees with this plan. She knows the goal of treatment in her case is control. She has been encouraged to call with any issues that might arise before her next visit here.  Marcelino Duster, NP   10/17/2014 10:58 AM

## 2014-10-24 ENCOUNTER — Ambulatory Visit (HOSPITAL_BASED_OUTPATIENT_CLINIC_OR_DEPARTMENT_OTHER): Payer: BC Managed Care – PPO

## 2014-10-24 ENCOUNTER — Other Ambulatory Visit (HOSPITAL_BASED_OUTPATIENT_CLINIC_OR_DEPARTMENT_OTHER): Payer: BC Managed Care – PPO

## 2014-10-24 VITALS — BP 139/87 | HR 83 | Temp 98.3°F | Resp 16

## 2014-10-24 DIAGNOSIS — G8929 Other chronic pain: Secondary | ICD-10-CM

## 2014-10-24 DIAGNOSIS — Z8673 Personal history of transient ischemic attack (TIA), and cerebral infarction without residual deficits: Secondary | ICD-10-CM

## 2014-10-24 DIAGNOSIS — C50919 Malignant neoplasm of unspecified site of unspecified female breast: Secondary | ICD-10-CM

## 2014-10-24 DIAGNOSIS — R519 Headache, unspecified: Secondary | ICD-10-CM

## 2014-10-24 DIAGNOSIS — C50312 Malignant neoplasm of lower-inner quadrant of left female breast: Secondary | ICD-10-CM

## 2014-10-24 DIAGNOSIS — R51 Headache: Secondary | ICD-10-CM

## 2014-10-24 DIAGNOSIS — Z23 Encounter for immunization: Secondary | ICD-10-CM

## 2014-10-24 LAB — CBC WITH DIFFERENTIAL/PLATELET
BASO%: 1.1 % (ref 0.0–2.0)
Basophils Absolute: 0 10*3/uL (ref 0.0–0.1)
EOS ABS: 0 10*3/uL (ref 0.0–0.5)
EOS%: 1.3 % (ref 0.0–7.0)
HCT: 40.3 % (ref 34.8–46.6)
HGB: 13.2 g/dL (ref 11.6–15.9)
LYMPH%: 36.7 % (ref 14.0–49.7)
MCH: 32.2 pg (ref 25.1–34.0)
MCHC: 32.8 g/dL (ref 31.5–36.0)
MCV: 98 fL (ref 79.5–101.0)
MONO#: 0.4 10*3/uL (ref 0.1–0.9)
MONO%: 10.1 % (ref 0.0–14.0)
NEUT%: 50.8 % (ref 38.4–76.8)
NEUTROS ABS: 1.9 10*3/uL (ref 1.5–6.5)
Platelets: 182 10*3/uL (ref 145–400)
RBC: 4.12 10*6/uL (ref 3.70–5.45)
RDW: 17.3 % — AB (ref 11.2–14.5)
WBC: 3.7 10*3/uL — ABNORMAL LOW (ref 3.9–10.3)
lymph#: 1.4 10*3/uL (ref 0.9–3.3)

## 2014-10-24 MED ORDER — INFLUENZA VAC SPLIT QUAD 0.5 ML IM SUSY
0.5000 mL | PREFILLED_SYRINGE | Freq: Once | INTRAMUSCULAR | Status: AC
  Administered 2014-10-24: 0.5 mL via INTRAMUSCULAR
  Filled 2014-10-24: qty 0.5

## 2014-10-24 NOTE — Patient Instructions (Signed)

## 2014-10-31 ENCOUNTER — Other Ambulatory Visit (HOSPITAL_BASED_OUTPATIENT_CLINIC_OR_DEPARTMENT_OTHER): Payer: BC Managed Care – PPO

## 2014-10-31 ENCOUNTER — Other Ambulatory Visit: Payer: Self-pay | Admitting: *Deleted

## 2014-10-31 DIAGNOSIS — C50919 Malignant neoplasm of unspecified site of unspecified female breast: Secondary | ICD-10-CM

## 2014-10-31 DIAGNOSIS — G8929 Other chronic pain: Secondary | ICD-10-CM

## 2014-10-31 DIAGNOSIS — C50312 Malignant neoplasm of lower-inner quadrant of left female breast: Secondary | ICD-10-CM

## 2014-10-31 DIAGNOSIS — R519 Headache, unspecified: Secondary | ICD-10-CM

## 2014-10-31 DIAGNOSIS — C770 Secondary and unspecified malignant neoplasm of lymph nodes of head, face and neck: Secondary | ICD-10-CM

## 2014-10-31 DIAGNOSIS — R51 Headache: Secondary | ICD-10-CM

## 2014-10-31 DIAGNOSIS — Z8673 Personal history of transient ischemic attack (TIA), and cerebral infarction without residual deficits: Secondary | ICD-10-CM

## 2014-10-31 LAB — COMPREHENSIVE METABOLIC PANEL (CC13)
ALBUMIN: 4 g/dL (ref 3.5–5.0)
ALT: 14 U/L (ref 0–55)
AST: 21 U/L (ref 5–34)
Alkaline Phosphatase: 59 U/L (ref 40–150)
Anion Gap: 12 mEq/L — ABNORMAL HIGH (ref 3–11)
BUN: 10.3 mg/dL (ref 7.0–26.0)
CHLORIDE: 108 meq/L (ref 98–109)
CO2: 24 meq/L (ref 22–29)
Calcium: 9.6 mg/dL (ref 8.4–10.4)
Creatinine: 1.1 mg/dL (ref 0.6–1.1)
Glucose: 82 mg/dl (ref 70–140)
POTASSIUM: 3.9 meq/L (ref 3.5–5.1)
SODIUM: 143 meq/L (ref 136–145)
TOTAL PROTEIN: 6.9 g/dL (ref 6.4–8.3)
Total Bilirubin: 0.52 mg/dL (ref 0.20–1.20)

## 2014-10-31 LAB — CBC WITH DIFFERENTIAL/PLATELET
BASO%: 0.8 % (ref 0.0–2.0)
Basophils Absolute: 0 10*3/uL (ref 0.0–0.1)
EOS ABS: 0.1 10*3/uL (ref 0.0–0.5)
EOS%: 1.3 % (ref 0.0–7.0)
HCT: 40 % (ref 34.8–46.6)
HGB: 13.2 g/dL (ref 11.6–15.9)
LYMPH#: 1.2 10*3/uL (ref 0.9–3.3)
LYMPH%: 29.8 % (ref 14.0–49.7)
MCH: 32.1 pg (ref 25.1–34.0)
MCHC: 33 g/dL (ref 31.5–36.0)
MCV: 97.3 fL (ref 79.5–101.0)
MONO#: 0.2 10*3/uL (ref 0.1–0.9)
MONO%: 6.1 % (ref 0.0–14.0)
NEUT#: 2.4 10*3/uL (ref 1.5–6.5)
NEUT%: 62 % (ref 38.4–76.8)
PLATELETS: 216 10*3/uL (ref 145–400)
RBC: 4.11 10*6/uL (ref 3.70–5.45)
RDW: 14.8 % — AB (ref 11.2–14.5)
WBC: 3.9 10*3/uL (ref 3.9–10.3)

## 2014-11-07 ENCOUNTER — Other Ambulatory Visit: Payer: BC Managed Care – PPO

## 2014-11-11 ENCOUNTER — Telehealth: Payer: Self-pay | Admitting: *Deleted

## 2014-11-11 ENCOUNTER — Other Ambulatory Visit: Payer: Self-pay | Admitting: *Deleted

## 2014-11-11 ENCOUNTER — Other Ambulatory Visit: Payer: Self-pay | Admitting: Obstetrics and Gynecology

## 2014-11-11 MED ORDER — METOCLOPRAMIDE HCL 10 MG PO TABS
5.0000 mg | ORAL_TABLET | Freq: Three times a day (TID) | ORAL | Status: DC
Start: 2014-11-11 — End: 2014-12-15

## 2014-11-11 NOTE — Telephone Encounter (Signed)
Pt called to this RN to state ongoing " GI discomfort right below my bra line ".  " I have been on omeprazole and now have increased issues that are mildly improved with Tums or alka seltzer chews "  " so I thought maybe the omeprazole wasn't working as well and changed back to Nexium which has been of benefit before."  " I have not noticed any change since taking the Nexium "  Per discussion above symptoms are interfering with ADL's and sleep.  Aleza is taking probiotics daily as well.  This note will be given to MD for review for appropriate recommendations.

## 2014-11-13 LAB — CYTOLOGY - PAP

## 2014-11-14 ENCOUNTER — Other Ambulatory Visit (HOSPITAL_BASED_OUTPATIENT_CLINIC_OR_DEPARTMENT_OTHER): Payer: BC Managed Care – PPO

## 2014-11-14 ENCOUNTER — Other Ambulatory Visit: Payer: Self-pay | Admitting: Oncology

## 2014-11-14 DIAGNOSIS — C50312 Malignant neoplasm of lower-inner quadrant of left female breast: Secondary | ICD-10-CM

## 2014-11-14 DIAGNOSIS — C799 Secondary malignant neoplasm of unspecified site: Secondary | ICD-10-CM

## 2014-11-14 DIAGNOSIS — C50919 Malignant neoplasm of unspecified site of unspecified female breast: Secondary | ICD-10-CM

## 2014-11-14 DIAGNOSIS — G8929 Other chronic pain: Secondary | ICD-10-CM

## 2014-11-14 DIAGNOSIS — R51 Headache: Secondary | ICD-10-CM

## 2014-11-14 DIAGNOSIS — R519 Headache, unspecified: Secondary | ICD-10-CM

## 2014-11-14 DIAGNOSIS — Z8673 Personal history of transient ischemic attack (TIA), and cerebral infarction without residual deficits: Secondary | ICD-10-CM

## 2014-11-14 LAB — CBC WITH DIFFERENTIAL/PLATELET
BASO%: 1 % (ref 0.0–2.0)
BASOS ABS: 0 10*3/uL (ref 0.0–0.1)
EOS%: 1 % (ref 0.0–7.0)
Eosinophils Absolute: 0 10*3/uL (ref 0.0–0.5)
HCT: 40.2 % (ref 34.8–46.6)
HGB: 13.3 g/dL (ref 11.6–15.9)
LYMPH#: 1.1 10*3/uL (ref 0.9–3.3)
LYMPH%: 34.8 % (ref 14.0–49.7)
MCH: 32 pg (ref 25.1–34.0)
MCHC: 33.1 g/dL (ref 31.5–36.0)
MCV: 96.9 fL (ref 79.5–101.0)
MONO#: 0.2 10*3/uL (ref 0.1–0.9)
MONO%: 6.6 % (ref 0.0–14.0)
NEUT#: 1.7 10*3/uL (ref 1.5–6.5)
NEUT%: 56.6 % (ref 38.4–76.8)
Platelets: 190 10*3/uL (ref 145–400)
RBC: 4.15 10*6/uL (ref 3.70–5.45)
RDW: 14.7 % — ABNORMAL HIGH (ref 11.2–14.5)
WBC: 3 10*3/uL — ABNORMAL LOW (ref 3.9–10.3)
nRBC: 0 % (ref 0–0)

## 2014-11-14 LAB — COMPREHENSIVE METABOLIC PANEL (CC13)
ALBUMIN: 4.2 g/dL (ref 3.5–5.0)
ALK PHOS: 57 U/L (ref 40–150)
ALT: 17 U/L (ref 0–55)
AST: 26 U/L (ref 5–34)
Anion Gap: 11 mEq/L (ref 3–11)
BILIRUBIN TOTAL: 0.7 mg/dL (ref 0.20–1.20)
BUN: 8.1 mg/dL (ref 7.0–26.0)
CO2: 24 mEq/L (ref 22–29)
Calcium: 9.4 mg/dL (ref 8.4–10.4)
Chloride: 106 mEq/L (ref 98–109)
Creatinine: 1.1 mg/dL (ref 0.6–1.1)
EGFR: 58 mL/min/{1.73_m2} — AB (ref >90)
GLUCOSE: 82 mg/dL (ref 70–140)
POTASSIUM: 4 meq/L (ref 3.5–5.1)
Sodium: 142 mEq/L (ref 136–145)
Total Protein: 7 g/dL (ref 6.4–8.3)

## 2014-11-21 ENCOUNTER — Other Ambulatory Visit (HOSPITAL_BASED_OUTPATIENT_CLINIC_OR_DEPARTMENT_OTHER): Payer: BC Managed Care – PPO

## 2014-11-21 DIAGNOSIS — Z8673 Personal history of transient ischemic attack (TIA), and cerebral infarction without residual deficits: Secondary | ICD-10-CM

## 2014-11-21 DIAGNOSIS — R51 Headache: Secondary | ICD-10-CM

## 2014-11-21 DIAGNOSIS — C50919 Malignant neoplasm of unspecified site of unspecified female breast: Secondary | ICD-10-CM

## 2014-11-21 DIAGNOSIS — C773 Secondary and unspecified malignant neoplasm of axilla and upper limb lymph nodes: Secondary | ICD-10-CM

## 2014-11-21 DIAGNOSIS — G8929 Other chronic pain: Secondary | ICD-10-CM

## 2014-11-21 DIAGNOSIS — C50312 Malignant neoplasm of lower-inner quadrant of left female breast: Secondary | ICD-10-CM

## 2014-11-21 LAB — COMPREHENSIVE METABOLIC PANEL (CC13)
ALBUMIN: 3.9 g/dL (ref 3.5–5.0)
ALT: 17 U/L (ref 0–55)
AST: 27 U/L (ref 5–34)
Alkaline Phosphatase: 57 U/L (ref 40–150)
Anion Gap: 10 mEq/L (ref 3–11)
BUN: 9.4 mg/dL (ref 7.0–26.0)
CHLORIDE: 108 meq/L (ref 98–109)
CO2: 25 mEq/L (ref 22–29)
Calcium: 9.2 mg/dL (ref 8.4–10.4)
Creatinine: 1 mg/dL (ref 0.6–1.1)
EGFR: 66 mL/min/{1.73_m2} — ABNORMAL LOW (ref >90)
Glucose: 74 mg/dl (ref 70–140)
POTASSIUM: 3.7 meq/L (ref 3.5–5.1)
SODIUM: 144 meq/L (ref 136–145)
TOTAL PROTEIN: 6.7 g/dL (ref 6.4–8.3)
Total Bilirubin: 0.45 mg/dL (ref 0.20–1.20)

## 2014-11-21 LAB — CBC WITH DIFFERENTIAL/PLATELET
BASO%: 1.2 % (ref 0.0–2.0)
BASOS ABS: 0 10*3/uL (ref 0.0–0.1)
EOS%: 1.6 % (ref 0.0–7.0)
Eosinophils Absolute: 0 10*3/uL (ref 0.0–0.5)
HCT: 39.8 % (ref 34.8–46.6)
HEMOGLOBIN: 13 g/dL (ref 11.6–15.9)
LYMPH%: 41.7 % (ref 14.0–49.7)
MCH: 31.7 pg (ref 25.1–34.0)
MCHC: 32.5 g/dL (ref 31.5–36.0)
MCV: 97.5 fL (ref 79.5–101.0)
MONO#: 0.3 10*3/uL (ref 0.1–0.9)
MONO%: 12.4 % (ref 0.0–14.0)
NEUT#: 1.1 10*3/uL — ABNORMAL LOW (ref 1.5–6.5)
NEUT%: 43.1 % (ref 38.4–76.8)
Platelets: 179 10*3/uL (ref 145–400)
RBC: 4.09 10*6/uL (ref 3.70–5.45)
RDW: 16 % — AB (ref 11.2–14.5)
WBC: 2.6 10*3/uL — ABNORMAL LOW (ref 3.9–10.3)
lymph#: 1.1 10*3/uL (ref 0.9–3.3)

## 2014-11-22 NOTE — Telephone Encounter (Signed)
none

## 2014-11-25 ENCOUNTER — Other Ambulatory Visit: Payer: Self-pay | Admitting: Oncology

## 2014-11-25 ENCOUNTER — Telehealth: Payer: Self-pay | Admitting: Emergency Medicine

## 2014-11-25 NOTE — Telephone Encounter (Signed)
Patient called to inform Dr Jana Hakim of an episode which occurred on 11/24/14. Patient states she did call and spoke with someone with "Call a Nurse", whom advised her to go to the ED.  Patient states that she was sitting in the floor, and when she stood up she noticed some visual changes. States that is was like "tunnel vision". States that her peripheral vision was blurry. States that once she sat down on the sofa things resolved. Denies any other symptoms occuring afterwards. Encouraged patient to drink plenty of water and to be cautious when changing positions. Patient also complaining of ongoing reflux symptoms. Advised patient to continue taking the reglan QID as directed and to notify this office of how she is doing at the end of this week when she is here for labs. Patient verbalized understanding.

## 2014-11-28 ENCOUNTER — Other Ambulatory Visit (HOSPITAL_BASED_OUTPATIENT_CLINIC_OR_DEPARTMENT_OTHER): Payer: BC Managed Care – PPO

## 2014-11-28 DIAGNOSIS — C50919 Malignant neoplasm of unspecified site of unspecified female breast: Secondary | ICD-10-CM

## 2014-11-28 DIAGNOSIS — R51 Headache: Secondary | ICD-10-CM

## 2014-11-28 DIAGNOSIS — Z8673 Personal history of transient ischemic attack (TIA), and cerebral infarction without residual deficits: Secondary | ICD-10-CM

## 2014-11-28 DIAGNOSIS — G8929 Other chronic pain: Secondary | ICD-10-CM

## 2014-11-28 DIAGNOSIS — R519 Headache, unspecified: Secondary | ICD-10-CM

## 2014-11-28 DIAGNOSIS — C50312 Malignant neoplasm of lower-inner quadrant of left female breast: Secondary | ICD-10-CM

## 2014-11-28 LAB — CBC WITH DIFFERENTIAL/PLATELET
BASO%: 1.2 % (ref 0.0–2.0)
Basophils Absolute: 0.1 10*3/uL (ref 0.0–0.1)
EOS%: 0.9 % (ref 0.0–7.0)
Eosinophils Absolute: 0 10*3/uL (ref 0.0–0.5)
HEMATOCRIT: 41.2 % (ref 34.8–46.6)
HGB: 13.4 g/dL (ref 11.6–15.9)
LYMPH%: 30.7 % (ref 14.0–49.7)
MCH: 31.6 pg (ref 25.1–34.0)
MCHC: 32.5 g/dL (ref 31.5–36.0)
MCV: 97.2 fL (ref 79.5–101.0)
MONO#: 0.5 10*3/uL (ref 0.1–0.9)
MONO%: 9.8 % (ref 0.0–14.0)
NEUT#: 2.9 10*3/uL (ref 1.5–6.5)
NEUT%: 57.4 % (ref 38.4–76.8)
PLATELETS: 237 10*3/uL (ref 145–400)
RBC: 4.24 10*6/uL (ref 3.70–5.45)
RDW: 15.5 % — ABNORMAL HIGH (ref 11.2–14.5)
WBC: 5 10*3/uL (ref 3.9–10.3)
lymph#: 1.5 10*3/uL (ref 0.9–3.3)

## 2014-12-04 ENCOUNTER — Other Ambulatory Visit: Payer: BC Managed Care – PPO

## 2014-12-08 ENCOUNTER — Ambulatory Visit (HOSPITAL_COMMUNITY)
Admission: RE | Admit: 2014-12-08 | Discharge: 2014-12-08 | Disposition: A | Discharge status: Home / Self Care | Payer: BC Managed Care – PPO | Source: Ambulatory Visit | Attending: Oncology | Admitting: Oncology

## 2014-12-08 ENCOUNTER — Encounter (HOSPITAL_COMMUNITY)
Admission: RE | Admit: 2014-12-08 | Discharge: 2014-12-08 | Disposition: A | Discharge status: Home / Self Care | Payer: BC Managed Care – PPO | Source: Ambulatory Visit | Attending: Oncology | Admitting: Oncology

## 2014-12-08 ENCOUNTER — Other Ambulatory Visit: Payer: Self-pay | Admitting: Oncology

## 2014-12-08 DIAGNOSIS — C50919 Malignant neoplasm of unspecified site of unspecified female breast: Secondary | ICD-10-CM

## 2014-12-08 DIAGNOSIS — C787 Secondary malignant neoplasm of liver and intrahepatic bile duct: Secondary | ICD-10-CM | POA: Insufficient documentation

## 2014-12-08 DIAGNOSIS — C50312 Malignant neoplasm of lower-inner quadrant of left female breast: Secondary | ICD-10-CM | POA: Insufficient documentation

## 2014-12-08 LAB — GLUCOSE, CAPILLARY: GLUCOSE-CAPILLARY: 91 mg/dL (ref 70–99)

## 2014-12-08 MED ORDER — FLUDEOXYGLUCOSE F - 18 (FDG) INJECTION
9.4000 | Freq: Once | INTRAVENOUS | Status: AC | PRN
  Administered 2014-12-08: 9.4 via INTRAVENOUS

## 2014-12-08 MED ORDER — GADOBENATE DIMEGLUMINE 529 MG/ML IV SOLN
20.0000 mL | Freq: Once | INTRAVENOUS | Status: AC | PRN
  Administered 2014-12-08: 18 mL via INTRAVENOUS

## 2014-12-10 ENCOUNTER — Ambulatory Visit (HOSPITAL_BASED_OUTPATIENT_CLINIC_OR_DEPARTMENT_OTHER): Payer: BC Managed Care – PPO | Admitting: Oncology

## 2014-12-10 ENCOUNTER — Other Ambulatory Visit (HOSPITAL_BASED_OUTPATIENT_CLINIC_OR_DEPARTMENT_OTHER): Payer: BC Managed Care – PPO

## 2014-12-10 VITALS — BP 142/83 | HR 72 | Temp 98.0°F | Resp 18 | Ht 64.0 in | Wt 195.5 lb

## 2014-12-10 DIAGNOSIS — C50912 Malignant neoplasm of unspecified site of left female breast: Secondary | ICD-10-CM

## 2014-12-10 DIAGNOSIS — G8929 Other chronic pain: Secondary | ICD-10-CM

## 2014-12-10 DIAGNOSIS — Z8673 Personal history of transient ischemic attack (TIA), and cerebral infarction without residual deficits: Secondary | ICD-10-CM

## 2014-12-10 DIAGNOSIS — C50312 Malignant neoplasm of lower-inner quadrant of left female breast: Secondary | ICD-10-CM

## 2014-12-10 DIAGNOSIS — C787 Secondary malignant neoplasm of liver and intrahepatic bile duct: Secondary | ICD-10-CM

## 2014-12-10 DIAGNOSIS — R519 Headache, unspecified: Secondary | ICD-10-CM

## 2014-12-10 DIAGNOSIS — C50919 Malignant neoplasm of unspecified site of unspecified female breast: Secondary | ICD-10-CM

## 2014-12-10 DIAGNOSIS — R51 Headache: Secondary | ICD-10-CM

## 2014-12-10 LAB — COMPREHENSIVE METABOLIC PANEL (CC13)
ALBUMIN: 3.6 g/dL (ref 3.5–5.0)
ALK PHOS: 56 U/L (ref 40–150)
ALT: 13 U/L (ref 0–55)
ANION GAP: 6 meq/L (ref 3–11)
AST: 20 U/L (ref 5–34)
BUN: 12 mg/dL (ref 7.0–26.0)
CHLORIDE: 107 meq/L (ref 98–109)
CO2: 27 meq/L (ref 22–29)
Calcium: 8.6 mg/dL (ref 8.4–10.4)
Creatinine: 1 mg/dL (ref 0.6–1.1)
EGFR: 69 mL/min/{1.73_m2} — AB (ref >90)
Glucose: 92 mg/dl (ref 70–140)
POTASSIUM: 3.9 meq/L (ref 3.5–5.1)
Sodium: 141 mEq/L (ref 136–145)
TOTAL PROTEIN: 6.4 g/dL (ref 6.4–8.3)
Total Bilirubin: 0.53 mg/dL (ref 0.20–1.20)

## 2014-12-10 LAB — CBC WITH DIFFERENTIAL/PLATELET
BASO%: 1 % (ref 0.0–2.0)
Basophils Absolute: 0 10*3/uL (ref 0.0–0.1)
EOS%: 1.9 % (ref 0.0–7.0)
Eosinophils Absolute: 0.1 10*3/uL (ref 0.0–0.5)
HCT: 39.8 % (ref 34.8–46.6)
HGB: 12.8 g/dL (ref 11.6–15.9)
LYMPH%: 32.6 % (ref 14.0–49.7)
MCH: 31.5 pg (ref 25.1–34.0)
MCHC: 32.2 g/dL (ref 31.5–36.0)
MCV: 97.7 fL (ref 79.5–101.0)
MONO#: 0.2 10*3/uL (ref 0.1–0.9)
MONO%: 7 % (ref 0.0–14.0)
NEUT#: 1.6 10*3/uL (ref 1.5–6.5)
NEUT%: 57.5 % (ref 38.4–76.8)
Platelets: 246 10*3/uL (ref 145–400)
RBC: 4.07 10*6/uL (ref 3.70–5.45)
RDW: 15.4 % — ABNORMAL HIGH (ref 11.2–14.5)
WBC: 2.9 10*3/uL — AB (ref 3.9–10.3)
lymph#: 0.9 10*3/uL (ref 0.9–3.3)

## 2014-12-10 NOTE — Progress Notes (Signed)
ID: Kristin Hoffman OB: 1966-07-05  MR#: [id number]  DTO#:[id number]  PCP: Drake Leach, Kristin Hoffman GYN:  Kristin Hoffman SU: Kristin Hoffman OTHER Kristin Hoffman: Kristin Hoffman  CHIEF COMPLAINT:  Stage IV breast cancer CURRENT TREATMENT: eribulin  BREAST CANCER HISTORY: This patient was previously followed by Kristin Hoffman, and was transferred to Kristin Hoffman service as of 08/20/2013.  At the age of 48, the patient had a screening mammogram as a baseline. She had recently given birth and was on birth control pills at that time. The mammogram in November 2006 showed an area of architectural distortion in the left lower inner quadrant. Subsequently an ultrasound was obtained of the left breast showing a 2.0 x 1.5 x 1.4 cm mass, at the 8:00 position, 4 cm from the nipple. A core biopsy performed on 10/21/2005 showed an invasive mammary carcinoma, ER +70%, PR +41%, HER-2/neu negative, with proliferation marker of 38%. [phone])  Breast MRI on 11/08/2005 confirmed a 2.5 cm spiculated mass in the left lower inner quadrant with 3 small satellite nodules adjacent to the primary mass: 3.5 cm posterior medial to the primary mass, measuring 9 mm; 1.5 cm anterior medial to the primary mass measuring 5 mm; and 2 cm medial to the primary mass measuring 5 mm. No axillary adenopathy was noted. No suspicious masses or enhancement are noted in the right breast.  Kristin Hoffman underwentt left lumpectomy under the care of Kristin Hoffman on 11/18/2005 for a 2.5 cm grade 3 invasive ductal carcinoma, ER +70%, PR +41%, HER-2/neu negative, with proliferation marker of 38%. 2 of 23 lymph nodes were involved.  Margins were clear.  She received adjuvant chemotherapy consisting of 6 q. three-week doses of docetaxel/doxorubicin/cyclophosphamide given between January 2007 and may of 2007, with last dose on 04/20/2006. She underwent radiation therapy completed 07/11/2006, after which she began on tamoxifen in early August 2007.  She underwent hysterectomy  with bilateral salpingo-oophorectomy on 07/15/2008, and was started on letrozole in October of 2009.  A mammogram 11/10/2009 showed microcalcifications in the left breast, and a subsequent biopsy on 11/18/2009 confirmed invasive mammary carcinoma in the left breast. PET and CT of the chest showed no evidence of metastasis in January 2011.  She underwent bilateral mastectomies 01/25/2010, with the right breast showing no evidence of malignancy; in the left breast showing a 1.0 cm grade 2 invasive ductal carcinoma with high-grade DCIS. Tumor was ER +22%, PR negative, HER-2/neu negative, with proliferation marker of 13%. Margins were clear. Patient underwent concurrent left latissimus flap reconstruction and right implant reconstruction.  She received additional chemotherapy with one cycle of carboplatin/gemcitabine given on 03/11/2010. She then received 5 q. three-week cycles of IV CMF between 03/25/2010 and 06/17/2010.  She was started on exemestane in January 2012 and continued until late November 2013 when she was hospitalized for apparent TIA.   Her subsequent history is as detailed below  INTERVAL HISTORY: Kristin Hoffman returns today for followup of her metastatic breast cancer accompanied by her husband Kristin Hoffman. She continues on exemestane and Palbociclib and generally has tolerated that well, with some dose adjustments. However since her last visit here she had her restaging PET scan and liver MRI, and this shows evidence of disease progression. She is here to discuss alternatives.  REVIEW OF SYSTEMS: [name] feels well overall. She specifically denies fevers, rash, bleeding, fatigue, weight loss, unusual headaches, visual changes, dizziness, gait imbalance, cough, phlegm production, pleurisy, or change in bowel or bladder habits. A detailed review of systems today was essentially benign  PAST  MEDICAL HISTORY: Past Medical History  Diagnosis Date  . Breast cancer   . Depression   . Reflux   . Cancer   .  Hx-TIA (transient ischemic attack) 11/22/2013  . Chronic headaches 11/22/2013  . Anxiety 11/22/2013  . Breast cancer metastasized to multiple sites 12/24/2013    PAST SURGICAL HISTORY: Past Surgical History  Procedure Laterality Date  . Mastectomy w/ nodes partial    . Mastectomy    . Breast reconstruction    . Portacath placement      x 2  . Portacath removal      x 2  . Abdominal hysterectomy    . Tee without cardioversion  11/12/2012    Procedure: TRANSESOPHAGEAL ECHOCARDIOGRAM (TEE);  Surgeon: Kristin Furbish, Kristin Hoffman;  Location: [facility] ENDOSCOPY;  Service: Cardiovascular;  Laterality: N/A;  This TEE may be Kristin Hoffman. Marlou Hoffman or a LHC Kristin Hoffman    FAMILY HISTORY Both parents are alive and well. Patient has one sister who is 35 years younger and is in good health. No other history of breast or ovarian cancer in the family. No family history on file.  GYNECOLOGIC HISTORY:   (Updated January 2015) G2P2, menarche at age 20 with irregular menses. On birth control pills in the past. On Clomid to induce ovulation with first pregnancy. Also had preeclampsia with first pregnancy. Status post hysterectomy and bilateral salpingo-oophorectomy in August 2009.  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 She is originally from [place]. She's been married to Kristin Hoffman, for 13 years. He works as an Pharmacist, hospital at [employer].   ADVANCED DIRECTIVES:    HEALTH MAINTENANCE:  (Updated 11/22/2013) History  Substance Use Topics  . Smoking status: Never Smoker   . Smokeless tobacco: Never Used  . Alcohol Use: Yes     Comment: rarely     Colonoscopy:  Never  PAP: S/P LAVH/BSO in August 2009  Bone density: Never  Lipid panel: Not on file   No Known Allergies  Current Outpatient Prescriptions  Medication Sig Dispense Refill  . aspirin 81 MG tablet Take 81 mg by mouth daily.    . cetirizine (ZYRTEC) 10 MG tablet Take 10  mg by mouth daily.      . Cholecalciferol 2000 UNITS CHEW Chew 1 tablet by mouth daily.    Marland Kitchen escitalopram (LEXAPRO) 20 MG tablet Take 20 mg by mouth daily with breakfast.     . exemestane (AROMASIN) 25 MG tablet     . LORazepam (ATIVAN) 0.5 MG tablet Take 1 mg by mouth at bedtime.     . metoCLOPramide (REGLAN) 10 MG tablet Take 0.5 tablets (5 mg total) by mouth 4 (four) times daily -  before meals and at bedtime. 120 tablet 3  . montelukast (SINGULAIR) 10 MG tablet Take 10 mg by mouth daily.    . Multiple Vitamin (MULTIVITAMIN) tablet Take 1 tablet by mouth daily.    Marland Kitchen omeprazole (PRILOSEC) 40 MG capsule Take 40 mg by mouth daily.     . palbociclib (IBRANCE) 75 MG capsule Take whole with food 21 days then off x 7. (Patient taking differently: Take whole with food every other day for 21 days, then off for 7 days.) 21 capsule 3   No current facility-administered medications for this visit.    OBJECTIVE: Young white woman in no acute distress Filed Vitals:   12/10/14 0944  BP: 142/83  Pulse: 72  Temp: 98 F (36.7 C)  Resp: 18  Body mass index is 33.54 kg/(m^2).  ECOG: 1 Filed Weights   12/10/14 0944  Weight: 195 lb 8 oz (88.678 kg)   Sclerae unicteric, pupils round and equal Oropharynx clear, teeth in good repair No cervical or supraclavicular adenopathy Lungs no rales or rhonchi Heart regular rate and rhythm Abd soft, nontender, positive bowel sounds MSK no focal spinal tenderness, no upper extremity lymphedema Neuro: nonfocal, well oriented, positive affect Breasts: deferred  LAB RESULTS:   Lab Results  Component Value Date   WBC 2.9* 12/10/2014   NEUTROABS 1.6 12/10/2014   HGB 12.8 12/10/2014   HCT 39.8 12/10/2014   MCV 97.7 12/10/2014   PLT 246 12/10/2014      Chemistry      Component Value Date/Time   NA 144 11/21/2014 0942   NA 142 11/12/2012 0735   K 3.7 11/21/2014 0942   K 3.8 11/12/2012 0735   CL 104 11/14/2012 1407   CL 104 11/12/2012 0735   CO2 25  11/21/2014 0942   CO2 30 11/12/2012 0735   BUN 9.4 11/21/2014 0942   BUN 13 11/12/2012 0735   CREATININE 1.0 11/21/2014 0942   CREATININE 0.97 11/12/2012 0735      Component Value Date/Time   CALCIUM 9.2 11/21/2014 0942   CALCIUM 9.1 11/12/2012 0735   ALKPHOS 57 11/21/2014 0942   ALKPHOS 69 11/12/2012 0735   AST 27 11/21/2014 0942   AST 16 11/12/2012 0735   ALT 17 11/21/2014 0942   ALT 13 11/12/2012 0735   BILITOT 0.45 11/21/2014 0942   BILITOT 0.9 11/12/2012 0735      STUDIES: Mr Abdomen W Wo Contrast  12/08/2014   CLINICAL DATA:  Followup metastatic breast carcinoma.  EXAM: MRI ABDOMEN WITHOUT AND WITH CONTRAST  TECHNIQUE: Multiplanar multisequence MR imaging of the abdomen was performed both before and after the administration of intravenous contrast.  CONTRAST:  16m MULTIHANCE GADOBENATE DIMEGLUMINE 529 MG/ML IV SOLN  COMPARISON:  03/20/2014  FINDINGS: Lower chest:  Unremarkable.  Hepatobiliary: Largest metastatic lesion in the dome of the right lobe has increased in size, currently measuring 1.8 x 3.1 cm on image 12/series 3 compared to 1.9 x 2.2 cm previously. Multiple other tiny liver metastases measuring up to 1 cm are seen in both right and left lobes, which are most conspicuous on series 3, and show increase in number compared to prior study.  Pancreas: No mass, inflammatory changes, or other parenchymal abnormality identified.  Spleen:  Within normal limits in size and appearance.  Adrenal Glands:  No mass identified.  Kidneys:  No masses identified.  No evidence of hydronephrosis.  Stomach/Bowel/Peritoneum: Visualized portions within the abdomen are unremarkable.  Vascular/Lymphatic: Mild porta hepatis lymphadenopathy which is better visualized on recent PET-CT shows no significant interval change.  Other:  None.  Musculoskeletal:  No suspicious bone lesions identified.  IMPRESSION: Mild increase in diffuse liver metastases since prior study.  Mild porta hepatis lymphadenopathy,  without significant change.   Electronically Kristin Hoffman   By: JEarle GellM.D.   On: 12/08/2014 14:11   Nm Pet Image Restag (ps) Skull Base To Thigh  12/08/2014   CLINICAL DATA:  Subsequent treatment strategy for breast carcinoma.  EXAM: NUCLEAR MEDICINE PET SKULL BASE TO THIGH  TECHNIQUE: 9.4 mCi F-18 FDG was injected intravenously. Full-ring PET imaging was performed from the skull base to thigh after the radiotracer. CT data was obtained and used for attenuation correction and anatomic localization.  FASTING BLOOD GLUCOSE:  Value: 91 mg/dl  COMPARISON:  12/02/2013  FINDINGS: NECK  Decrease hypermetabolic lymphadenopathy is seen in the supraclavicular regions, with index lymph node in the left supraclavicular region having SUV max of 2.9 compared to 6.6 previously. No new hypermetabolic lymphadenopathy identified.  CHEST  Decreased hypermetabolic lymphadenopathy in the mediastinum, with index lymph node in the right paratracheal region having SUV max of 4.2 compared to 8.0 previously.  A 9 mm pulmonary nodule in the posterior right upper lobe on image 49 and series 3 and 4, has increased in size from 7 mm and shows increased metabolic activity, with SUV max of 2.7 compared to 1.4 previously. Another new sub-cm nodule in the left upper lobe on image 49 shows hypermetabolic activity. These are suspicious for small pulmonary metastases. Previously noted pulmonary nodule in the posterior left lung base is no longer visualized by CT.  ABDOMEN/PELVIS  Hypermetabolic metastases in the dome of the right hepatic lobe has SUV max of 10.5 compared to 6.5 previously. No new hypermetabolic liver metastases are identified.  Hypermetabolic lymphadenopathy in the porta hepatis has decreased since previous study, with index lymph node showing SUV max of 4.0 compared with 10.6 previously. There has been near complete resolution of hypermetabolic retroperitoneal lymphadenopathy in the aorta caval and left paraaortic spaces. No  hypermetabolic lymphadenopathy identified within the pelvis.  SKELETON  No focal hypermetabolic activity to suggest skeletal metastasis.  IMPRESSION: Partial metabolic response to therapy of metastatic lymphadenopathy within the neck, chest, and abdomen.  Mildly increased metabolic activity associated with sub-cm bilateral upper lobe pulmonary nodules, suspicious for pulmonary metastases, and metastases in the dome of the right hepatic lobe.   Electronically Kristin Hoffman   By: Earle Gell M.D.   On: 12/08/2014 12:56    ASSESSMENT: 48 y.o. 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 2 of 23 lymph nodes were involved.  Margins were clear.  (2) status post adjuvant chemotherapy consisting of 6 q. three-week doses of docetaxel/doxorubicin/cyclophosphamide given between January 2007 and may of 2007, with last dose on 04/20/2006.  (3) status post radiation therapy to the left breast, completed 07/11/2006  (4) began on tamoxifen in early August 2007 and continued until October 2009. She status post hysterectomy with bilateral salpingo-oophorectomy on 07/15/2008, and was started on letrozole in October of 2009.  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 PET and CT of the chest showed no evidence of metastasis in January 2011.  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 Margins were clear. Patient underwent concurrent left latissimus flap reconstruction and right implant reconstruction.  (7)  status post additional  chemotherapy with one cycle of carboplatin/gemcitabine given on 03/11/2010. She then received 5 q. three-week cycles of IV CMF between 03/25/2010 and 06/17/2010.  (8) started on exemestane in January 2012 and continued until late November 2013 when she was hospitalized for apparent TIA at which time her exemestane was stopped and not resumed  (9) METASTATIC DISEASE: evidence of disease recurence noted on chest CT, liver MRI and PET scan in  December 2014, with suspicious lung nodules, lymphadenopathy, and 3 lesions in the right lobe of the liver, but no evidence of bony disease and no evidence of brain involvement by brain MRI September 2014. Biopsy of a left supraclavicular lymph node on 12/11/2013 confirmed metastatic carcinoma, estrogen receptor 45% positive, progesterone receptor 17% positive, with no HER-2 amplification.  (10) fulvestrant started 12/03/2013, discontinued 03/25/2014 with progression  (11) exemestane/ everolimus started 04/04/2014; everolimus held 04/14/2014, with skin rash and mouth soreness; resumed at 5 mg a day as of 04/29/2014, discontinued 05/09/2014 with similar symptoms  (12) continueing exemestane, adding palbociclib 05/26/2014;   (a) palbociclib dose dropped to 75 mg every other day for 21 days beginning with cycle 4  (b) exemestane and Palbociclib discontinued December 2015 with evidence of progression  (13) [facility] referral placed 04/28/2049 -- re-sent 05/23/2014  (14) [facility] one test requested 12/10/2014  (15) to start eribulin 12/25/2014, with restaging studies to follow after 3-4 cycles   PLAN:  Ellena is showing evidence of disease progression particularly in the liver. We could switch to another antiestrogen: She did not progress through tamoxifen or letrozole for example, and she has never had anastrozole. However at this point I think a better option would be to go to chemotherapy and try to bring the liver under control, before trying again to hold the  remission with anti-estrogens   We discussed specifically capecitabine and eribulin. She had fluorouracil with her CMF treatment in the past. We are going to try eribulin and we did discuss the possible toxicities, side effects and complications today.  She will benefit from a port. We are requesting that that be placed the first week in January. We will start chemotherapy the second week in January. She does not know yet which daily week would be most convenient for her. The plan will be to proceed to 3 or 4 cycles of eribulin (6 or 8 doses over 9-12 weeks) and then restage with a liver MRI.  We are also going to request a [facility] one test in case the eribulin does not work, since that might give [place] additional treatment options at that point.  She has a good understanding of the overall plan. She agrees with it. She knows the goal of treatment in her case is control. She will call with any problems that may develop before her next visit here.  Chauncey Cruel, Kristin Hoffman   12/10/2014 10:14 AM

## 2014-12-11 ENCOUNTER — Other Ambulatory Visit: Payer: Self-pay | Admitting: Emergency Medicine

## 2014-12-13 MED ORDER — DEXAMETHASONE 4 MG PO TABS: 8.0000 mg | ORAL_TABLET | Freq: Two times a day (BID) | ORAL | Status: DC

## 2014-12-13 MED ORDER — LIDOCAINE-PRILOCAINE 2.5-2.5 % EX CREA: TOPICAL_CREAM | CUTANEOUS | Status: DC

## 2014-12-13 MED ORDER — ONDANSETRON HCL 8 MG PO TABS: 8.0000 mg | ORAL_TABLET | Freq: Two times a day (BID) | ORAL | Status: DC

## 2014-12-13 MED ORDER — PROCHLORPERAZINE MALEATE 10 MG PO TABS: 10.0000 mg | ORAL_TABLET | Freq: Four times a day (QID) | ORAL | Status: DC | PRN

## 2014-12-13 MED ORDER — LORAZEPAM 0.5 MG PO TABS: 0.5000 mg | ORAL_TABLET | Freq: Every evening | ORAL | Status: DC | PRN

## 2014-12-15 ENCOUNTER — Other Ambulatory Visit: Payer: Self-pay | Admitting: *Deleted

## 2014-12-15 ENCOUNTER — Other Ambulatory Visit: Payer: Self-pay | Admitting: Radiology

## 2014-12-15 NOTE — Addendum Note (Signed)
Addended by: [name] on: 12/15/2014 05:38 PM   Modules accepted: Orders, Medications

## 2014-12-16 ENCOUNTER — Telehealth: Payer: Self-pay | Admitting: Oncology

## 2014-12-16 NOTE — Telephone Encounter (Signed)
per pof tos ch pt appt-sent MW email to sch trmt-will call pt after reply °

## 2014-12-17 ENCOUNTER — Ambulatory Visit (HOSPITAL_COMMUNITY)
Admission: RE | Admit: 2014-12-17 | Discharge: 2014-12-17 | Disposition: A | Discharge status: Home / Self Care | Payer: 59 | Source: Ambulatory Visit | Attending: Oncology | Admitting: Oncology

## 2014-12-17 ENCOUNTER — Other Ambulatory Visit: Payer: Self-pay | Admitting: *Deleted

## 2014-12-17 ENCOUNTER — Encounter (HOSPITAL_COMMUNITY): Payer: Self-pay

## 2014-12-17 ENCOUNTER — Telehealth: Payer: Self-pay | Admitting: *Deleted

## 2014-12-17 ENCOUNTER — Other Ambulatory Visit: Payer: Self-pay | Admitting: Oncology

## 2014-12-17 DIAGNOSIS — C50912 Malignant neoplasm of unspecified site of left female breast: Secondary | ICD-10-CM

## 2014-12-17 DIAGNOSIS — Z8673 Personal history of transient ischemic attack (TIA), and cerebral infarction without residual deficits: Secondary | ICD-10-CM | POA: Insufficient documentation

## 2014-12-17 DIAGNOSIS — F329 Major depressive disorder, single episode, unspecified: Secondary | ICD-10-CM | POA: Diagnosis not present

## 2014-12-17 DIAGNOSIS — Z452 Encounter for adjustment and management of vascular access device: Secondary | ICD-10-CM | POA: Insufficient documentation

## 2014-12-17 DIAGNOSIS — Z7982 Long term (current) use of aspirin: Secondary | ICD-10-CM | POA: Diagnosis not present

## 2014-12-17 DIAGNOSIS — Z79899 Other long term (current) drug therapy: Secondary | ICD-10-CM | POA: Insufficient documentation

## 2014-12-17 DIAGNOSIS — R51 Headache: Secondary | ICD-10-CM | POA: Diagnosis not present

## 2014-12-17 DIAGNOSIS — Z853 Personal history of malignant neoplasm of breast: Secondary | ICD-10-CM | POA: Diagnosis not present

## 2014-12-17 DIAGNOSIS — K219 Gastro-esophageal reflux disease without esophagitis: Secondary | ICD-10-CM | POA: Diagnosis not present

## 2014-12-17 LAB — CBC WITH DIFFERENTIAL/PLATELET
Basophils Absolute: 0 10*3/uL (ref 0.0–0.1)
Basophils Relative: 0 % (ref 0–1)
EOS PCT: 1 % (ref 0–5)
Eosinophils Absolute: 0 10*3/uL (ref 0.0–0.7)
HCT: 42.1 % (ref 36.0–46.0)
HEMOGLOBIN: 13.7 g/dL (ref 12.0–15.0)
Lymphocytes Relative: 31 % (ref 12–46)
Lymphs Abs: 1.1 10*3/uL (ref 0.7–4.0)
MCH: 31.8 pg (ref 26.0–34.0)
MCHC: 32.5 g/dL (ref 30.0–36.0)
MCV: 97.7 fL (ref 78.0–100.0)
Monocytes Absolute: 0.3 10*3/uL (ref 0.1–1.0)
Monocytes Relative: 9 % (ref 3–12)
Neutro Abs: 2.2 10*3/uL (ref 1.7–7.7)
Neutrophils Relative %: 59 % (ref 43–77)
PLATELETS: 203 10*3/uL (ref 150–400)
RBC: 4.31 MIL/uL (ref 3.87–5.11)
RDW: 14 % (ref 11.5–15.5)
WBC: 3.7 10*3/uL — ABNORMAL LOW (ref 4.0–10.5)

## 2014-12-17 LAB — PROTIME-INR
INR: 0.98 (ref 0.00–1.49)
PROTHROMBIN TIME: 13.1 s (ref 11.6–15.2)

## 2014-12-17 LAB — APTT: aPTT: 30 seconds (ref 24–37)

## 2014-12-17 MED ORDER — SODIUM CHLORIDE 0.9 % IV SOLN
INTRAVENOUS | Status: AC | PRN
  Administered 2014-12-17: 10 mL/h via INTRAVENOUS

## 2014-12-17 MED ORDER — MIDAZOLAM HCL 2 MG/2ML IJ SOLN
INTRAMUSCULAR | Status: AC | PRN
  Administered 2014-12-17 (×2): 1 mg via INTRAVENOUS

## 2014-12-17 MED ORDER — MIDAZOLAM HCL 2 MG/2ML IJ SOLN
INTRAMUSCULAR | Status: AC
  Filled 2014-12-17: qty 6

## 2014-12-17 MED ORDER — FENTANYL CITRATE 0.05 MG/ML IJ SOLN
INTRAMUSCULAR | Status: AC | PRN
  Administered 2014-12-17: 25 ug via INTRAVENOUS
  Administered 2014-12-17: 50 ug via INTRAVENOUS

## 2014-12-17 MED ORDER — SODIUM CHLORIDE 0.9 % IV SOLN
INTRAVENOUS | Status: DC
  Administered 2014-12-17: 12:00:00 via INTRAVENOUS

## 2014-12-17 MED ORDER — CEFAZOLIN SODIUM-DEXTROSE 2-3 GM-% IV SOLR
INTRAVENOUS | Status: AC
  Administered 2014-12-17: 2 g via INTRAVENOUS
  Filled 2014-12-17: qty 50

## 2014-12-17 MED ORDER — LIDOCAINE HCL 1 % IJ SOLN
INTRAMUSCULAR | Status: AC
  Filled 2014-12-17: qty 20

## 2014-12-17 MED ORDER — HEPARIN SOD (PORK) LOCK FLUSH 100 UNIT/ML IV SOLN
INTRAVENOUS | Status: AC
  Filled 2014-12-17: qty 5

## 2014-12-17 MED ORDER — CEFAZOLIN SODIUM-DEXTROSE 2-3 GM-% IV SOLR
2.0000 g | INTRAVENOUS | Status: AC
  Administered 2014-12-17: 2 g via INTRAVENOUS

## 2014-12-17 MED ORDER — FENTANYL CITRATE 0.05 MG/ML IJ SOLN
INTRAMUSCULAR | Status: AC
  Filled 2014-12-17: qty 6

## 2014-12-17 NOTE — Procedures (Signed)
Procedure: Single lumen port placement Access:  Right IJ Port placed with tip at cavoatrial junction.  No PTX.

## 2014-12-17 NOTE — Discharge Instructions (Signed)
Leave dressing on for 24 hours.  You may shower after 24 hours.  Please remove the dressing before you shower.    ° °Conscious Sedation, Adult, Care After °Refer to this sheet in the next few weeks. These instructions provide you with information on caring for yourself after your procedure. Your health care provider may also give you more specific instructions. Your treatment has been planned according to current medical practices, but problems sometimes occur. Call your health care provider if you have any problems or questions after your procedure. °WHAT TO EXPECT AFTER THE PROCEDURE  °After your procedure: °· You may feel sleepy, clumsy, and have poor balance for several hours. °· Vomiting may occur if you eat too soon after the procedure. °HOME CARE INSTRUCTIONS °· Do not participate in any activities where you could become injured for at least 24 hours. Do not: °¨ Drive. °¨ Swim. °¨ Ride a bicycle. °¨ Operate heavy machinery. °¨ Cook. °¨ Use power tools. °¨ Climb ladders. °¨ Work from a high place. °· Do not make important decisions or sign legal documents until you are improved. °· If you vomit, drink water, juice, or soup when you can drink without vomiting. Make sure you have little or no nausea before eating solid foods. °· Only take over-the-counter or prescription medicines for pain, discomfort, or fever as directed by your health care provider. °· Make sure you and your family fully understand everything about the medicines given to you, including what side effects may occur. °· You should not drink alcohol, take sleeping pills, or take medicines that cause drowsiness for at least 24 hours. °· If you smoke, do not smoke without supervision. °· If you are feeling better, you may resume normal activities 24 hours after you were sedated. °· Keep all appointments with your health care provider. °SEEK MEDICAL CARE IF: °· Your skin is pale or bluish in color. °· You continue to feel nauseous or vomit. °· Your  pain is getting worse and is not helped by medicine. °· You have bleeding or swelling. °· You are still sleepy or feeling clumsy after 24 hours. °SEEK IMMEDIATE MEDICAL CARE IF: °· You develop a rash. °· You have difficulty breathing. °· You develop any type of allergic problem. °· You have a fever. °MAKE SURE YOU: °· Understand these instructions. °· Will watch your condition. °· Will get help right away if you are not doing well or get worse. °Document Released: 09/18/2013 Document Reviewed: 09/18/2013 °ExitCare® Patient Information ©2015 [employer]. This information is not intended to replace advice given to you by your health care provider. Make sure you discuss any questions you have with your health care provider. °Implanted Port Insertion, Care After °Refer to this sheet in the next few weeks. These instructions provide you with information on caring for yourself after your procedure. Your health care provider may also give you more specific instructions. Your treatment has been planned according to current medical practices, but problems sometimes occur. Call your health care provider if you have any problems or questions after your procedure. °WHAT TO EXPECT AFTER THE PROCEDURE °After your procedure, it is typical to have the following:  °· Discomfort at the port insertion site. Ice packs to the area will help. °· Bruising on the skin over the port. This will subside in 3-4 days. °HOME CARE INSTRUCTIONS °· After your port is placed, you will get a manufacturer's information card. The card has information about your port. Keep this card with you   at all times.   °· Know what kind of port you have. There are many types of ports available.   °· Wear a medical alert bracelet in case of an emergency. This can help alert health care workers that you have a port.   °· The port can stay in for as long as your health care provider believes it is necessary.   °· A home health care nurse may give medicines and take  care of the port.   °· You or a family member can get special training and directions for giving medicine and taking care of the port at home.   °SEEK MEDICAL CARE IF:  °· Your port does not flush or you are unable to get a blood return.   °· You have a fever or chills. °SEEK IMMEDIATE MEDICAL CARE IF: °· You have new fluid or pus coming from your incision.   °· You notice a bad smell coming from your incision site.   °· You have swelling, pain, or more redness at the incision or port site.   °· You have chest pain or shortness of breath. °Document Released: 09/18/2013 Document Revised: 12/03/2013 Document Reviewed: 09/18/2013 °ExitCare® Patient Information ©2015 [employer]. This information is not intended to replace advice given to you by your health care provider. Make sure you discuss any questions you have with your health care provider. ° °

## 2014-12-17 NOTE — H&P (Signed)
Chief Complaint: "I'm here to get another port a cath"  Referring Physician(s): Magrinat,Gustav C  History of Present Illness: Kristin Hoffman is a 49 y.o. female with history of metastatic breast carcinoma/recent evidence of disease progression who presents today for port a cath placement for chemotherapy.   Past Medical History  Diagnosis Date  . Breast cancer   . Depression   . Reflux   . Cancer   . Hx-TIA (transient ischemic attack) 11/22/2013  . Chronic headaches 11/22/2013  . Anxiety 11/22/2013  . Breast cancer metastasized to multiple sites 12/24/2013    Past Surgical History  Procedure Laterality Date  . Mastectomy w/ nodes partial    . Mastectomy    . Breast reconstruction    . Portacath placement      x 2  . Portacath removal      x 2  . Abdominal hysterectomy    . Tee without cardioversion  11/12/2012    Procedure: TRANSESOPHAGEAL ECHOCARDIOGRAM (TEE);  Surgeon: Candee Furbish, MD;  Location: [facility] ENDOSCOPY;  Service: Cardiovascular;  Laterality: N/A;  This TEE may be Dr. Marlou Porch or a [facility] Dr    Allergies: Review of patient's allergies indicates no known allergies.  Medications: Prior to Admission medications   Medication Sig Start Date End Date Taking? Authorizing Provider  acidophilus (RISAQUAD) CAPS capsule Take 1 capsule by mouth daily.   Yes Historical Provider, MD  aspirin 81 MG tablet Take 81 mg by mouth daily.   Yes Historical Provider, MD  cetirizine (ZYRTEC) 10 MG tablet Take 10 mg by mouth daily.     Yes Historical Provider, MD  Cholecalciferol 2000 UNITS CHEW Chew 1 tablet by mouth daily.   Yes Historical Provider, MD  escitalopram (LEXAPRO) 20 MG tablet Take 20 mg by mouth daily with breakfast.    Yes Historical Provider, MD  LORazepam (ATIVAN) 0.5 MG tablet Take 1 tablet (0.5 mg total) by mouth at bedtime as needed (Nausea or vomiting). 12/13/14  Yes Chauncey Cruel, MD  Melatonin 10 MG TABS Take 1 tablet by mouth at bedtime.   Yes Historical  Provider, MD  montelukast (SINGULAIR) 10 MG tablet Take 10 mg by mouth daily.   Yes Historical Provider, MD  Multiple Vitamin (MULTIVITAMIN) tablet Take 1 tablet by mouth daily.   Yes Historical Provider, MD  omeprazole (PRILOSEC) 40 MG capsule Take 40 mg by mouth daily.  07/26/13  Yes Drake Leach, MD  OVER THE COUNTER MEDICATION Take 2 capsules by mouth 3 (three) times daily.   Yes Historical Provider, MD  OVER THE COUNTER MEDICATION Take 2 drops by mouth 2 (two) times daily.   Yes Historical Provider, MD  OVER THE COUNTER MEDICATION Take 1 drop by mouth daily as needed (FOR ACID REFLUX).   Yes Historical Provider, MD  vitamin C (ASCORBIC ACID) 500 MG tablet Take 500 mg by mouth daily.   Yes Historical Provider, MD  dexamethasone (DECADRON) 4 MG tablet Take 2 tablets (8 mg total) by mouth 2 (two) times daily with a meal. Start the day after chemotherapy for 2 days. Take with food. Patient not taking: Reported on 12/15/2014 12/13/14   Chauncey Cruel, MD  lidocaine-prilocaine (EMLA) cream Apply to affected area once 12/13/14   Chauncey Cruel, MD  ondansetron (ZOFRAN) 8 MG tablet Take 1 tablet (8 mg total) by mouth 2 (two) times daily. Start the day after chemo for 2 days. Then take as needed for nausea or vomiting. Patient not taking: Reported  on 12/15/2014 12/13/14   Chauncey Cruel, MD  prochlorperazine (COMPAZINE) 10 MG tablet Take 1 tablet (10 mg total) by mouth every 6 (six) hours as needed (Nausea or vomiting). Patient not taking: Reported on 12/15/2014 12/13/14   Chauncey Cruel, MD    History reviewed. No pertinent family history.  History   Social History  . Marital Status: Married    Spouse Name: N/A    Number of Children: N/A  . Years of Education: N/A   Social History Main Topics  . Smoking status: Never Smoker   . Smokeless tobacco: Never Used  . Alcohol Use: Yes     Comment: rarely  . Drug Use: No  . Sexual Activity: Yes    Birth Control/ Protection: Surgical   Other  Topics Concern  . None   Social History Narrative      Review of Systems  Constitutional: Negative for fever and chills.  Respiratory: Negative for cough and shortness of breath.   Cardiovascular: Negative for chest pain.  Gastrointestinal: Negative for nausea, vomiting, abdominal pain and blood in stool.  Genitourinary: Negative for dysuria and hematuria.  Musculoskeletal: Negative for back pain.  Neurological: Negative for headaches.  Hematological: Does not bruise/bleed easily.    Vital Signs: BP 141/97 mmHg  Pulse 97  Temp(Src) 99.2 F (37.3 C) (Oral)  Resp 18  Ht 5\' 4"  (1.626 m)  Wt 195 lb 8 oz (88.678 kg)  BMI 33.54 kg/m2  SpO2 98%  Physical Exam  Constitutional: She is oriented to person, place, and time. She appears well-developed and well-nourished.  Cardiovascular: Normal rate and regular rhythm.   Pulmonary/Chest: Effort normal and breath sounds normal.  Abdominal: Soft. Bowel sounds are normal. There is no tenderness.  Musculoskeletal: Normal range of motion. She exhibits no edema.  Neurological: She is alert and oriented to person, place, and time.    Imaging: Mr Abdomen W Wo Contrast  12/08/2014   CLINICAL DATA:  Followup metastatic breast carcinoma.  EXAM: MRI ABDOMEN WITHOUT AND WITH CONTRAST  TECHNIQUE: Multiplanar multisequence MR imaging of the abdomen was performed both before and after the administration of intravenous contrast.  CONTRAST:  31mL MULTIHANCE GADOBENATE DIMEGLUMINE 529 MG/ML IV SOLN  COMPARISON:  03/20/2014  FINDINGS: Lower chest:  Unremarkable.  Hepatobiliary: Largest metastatic lesion in the dome of the right lobe has increased in size, currently measuring 1.8 x 3.1 cm on image 12/series 3 compared to 1.9 x 2.2 cm previously. Multiple other tiny liver metastases measuring up to 1 cm are seen in both right and left lobes, which are most conspicuous on series 3, and show increase in number compared to prior study.  Pancreas: No mass,  inflammatory changes, or other parenchymal abnormality identified.  Spleen:  Within normal limits in size and appearance.  Adrenal Glands:  No mass identified.  Kidneys:  No masses identified.  No evidence of hydronephrosis.  Stomach/Bowel/Peritoneum: Visualized portions within the abdomen are unremarkable.  Vascular/Lymphatic: Mild porta hepatis lymphadenopathy which is better visualized on recent PET-CT shows no significant interval change.  Other:  None.  Musculoskeletal:  No suspicious bone lesions identified.  IMPRESSION: Mild increase in diffuse liver metastases since prior study.  Mild porta hepatis lymphadenopathy, without significant change.   Electronically Signed   By: Earle Gell M.D.   On: 12/08/2014 14:11   Nm Pet Image Restag (ps) Skull Base To Thigh  12/08/2014   CLINICAL DATA:  Subsequent treatment strategy for breast carcinoma.  EXAM: NUCLEAR MEDICINE PET  SKULL BASE TO THIGH  TECHNIQUE: 9.4 mCi F-18 FDG was injected intravenously. Full-ring PET imaging was performed from the skull base to thigh after the radiotracer. CT data was obtained and used for attenuation correction and anatomic localization.  FASTING BLOOD GLUCOSE:  Value: 91 mg/dl  COMPARISON:  12/02/2013  FINDINGS: NECK  Decrease hypermetabolic lymphadenopathy is seen in the supraclavicular regions, with index lymph node in the left supraclavicular region having SUV max of 2.9 compared to 6.6 previously. No new hypermetabolic lymphadenopathy identified.  CHEST  Decreased hypermetabolic lymphadenopathy in the mediastinum, with index lymph node in the right paratracheal region having SUV max of 4.2 compared to 8.0 previously.  A 9 mm pulmonary nodule in the posterior right upper lobe on image 49 and series 3 and 4, has increased in size from 7 mm and shows increased metabolic activity, with SUV max of 2.7 compared to 1.4 previously. Another new sub-cm nodule in the left upper lobe on image 49 shows hypermetabolic activity. These are  suspicious for small pulmonary metastases. Previously noted pulmonary nodule in the posterior left lung base is no longer visualized by CT.  ABDOMEN/PELVIS  Hypermetabolic metastases in the dome of the right hepatic lobe has SUV max of 10.5 compared to 6.5 previously. No new hypermetabolic liver metastases are identified.  Hypermetabolic lymphadenopathy in the porta hepatis has decreased since previous study, with index lymph node showing SUV max of 4.0 compared with 10.6 previously. There has been near complete resolution of hypermetabolic retroperitoneal lymphadenopathy in the aorta caval and left paraaortic spaces. No hypermetabolic lymphadenopathy identified within the pelvis.  SKELETON  No focal hypermetabolic activity to suggest skeletal metastasis.  IMPRESSION: Partial metabolic response to therapy of metastatic lymphadenopathy within the neck, chest, and abdomen.  Mildly increased metabolic activity associated with sub-cm bilateral upper lobe pulmonary nodules, suspicious for pulmonary metastases, and metastases in the dome of the right hepatic lobe.   Electronically Signed   By: Earle Gell M.D.   On: 12/08/2014 12:56    Labs:  CBC:  Recent Labs  11/21/14 0941 11/28/14 0938 12/10/14 0929 12/17/14 1200  WBC 2.6* 5.0 2.9* 3.7*  HGB 13.0 13.4 12.8 13.7  HCT 39.8 41.2 39.8 42.1  PLT 179 237 246 203    COAGS: No results for input(s): INR, APTT in the last 8760 hours.  BMP:  Recent Labs  10/31/14 0938 11/14/14 1000 11/21/14 0942 12/10/14 0929  NA 143 142 144 141  K 3.9 4.0 3.7 3.9  CO2 24 24 25 27   GLUCOSE 82 82 74 92  BUN 10.3 8.1 9.4 12.0  CALCIUM 9.6 9.4 9.2 8.6  CREATININE 1.1 1.1 1.0 1.0    LIVER FUNCTION TESTS:  Recent Labs  10/31/14 0938 11/14/14 1000 11/21/14 0942 12/10/14 0929  BILITOT 0.52 0.70 0.45 0.53  AST 21 26 27 20   ALT 14 17 17 13   ALKPHOS 59 57 57 56  PROT 6.9 7.0 6.7 6.4  ALBUMIN 4.0 4.2 3.9 3.6    TUMOR MARKERS: No results for input(s):  AFPTM, CEA, CA199, CHROMGRNA in the last 8760 hours.  Assessment and Plan: Kristin Hoffman is a 49 y.o. female with history of metastatic breast carcinoma/recent evidence of disease progression who presents today for port a cath placement for chemotherapy. Details/risks of procedure d/w pt with her understanding and consent.    Signed: Autumn Messing 12/17/2014, 12:28 PM

## 2014-12-17 NOTE — Telephone Encounter (Signed)
Per staff message and POF I have scheduled appts. Advised scheduler of appts. JMW  

## 2014-12-19 ENCOUNTER — Other Ambulatory Visit: Payer: BC Managed Care – PPO

## 2014-12-22 ENCOUNTER — Other Ambulatory Visit (HOSPITAL_COMMUNITY)
Admission: RE | Admit: 2014-12-22 | Discharge: 2014-12-22 | Disposition: A | Discharge status: Home / Self Care | Payer: 59 | Source: Ambulatory Visit | Attending: Oncology | Admitting: Oncology

## 2014-12-22 ENCOUNTER — Other Ambulatory Visit: Payer: Self-pay

## 2014-12-22 DIAGNOSIS — C50912 Malignant neoplasm of unspecified site of left female breast: Secondary | ICD-10-CM | POA: Insufficient documentation

## 2014-12-22 DIAGNOSIS — D0592 Unspecified type of carcinoma in situ of left breast: Secondary | ICD-10-CM | POA: Diagnosis not present

## 2014-12-23 ENCOUNTER — Telehealth: Payer: Self-pay | Admitting: Nurse Practitioner

## 2014-12-23 ENCOUNTER — Other Ambulatory Visit: Payer: Self-pay | Admitting: Emergency Medicine

## 2014-12-23 NOTE — Telephone Encounter (Signed)
, °

## 2014-12-24 ENCOUNTER — Encounter: Payer: Self-pay | Admitting: *Deleted

## 2014-12-24 ENCOUNTER — Other Ambulatory Visit: Payer: Self-pay | Admitting: Oncology

## 2014-12-25 ENCOUNTER — Other Ambulatory Visit: Payer: Self-pay

## 2014-12-25 ENCOUNTER — Ambulatory Visit: Payer: Self-pay | Admitting: Nurse Practitioner

## 2014-12-26 ENCOUNTER — Ambulatory Visit (HOSPITAL_BASED_OUTPATIENT_CLINIC_OR_DEPARTMENT_OTHER): Payer: 59

## 2014-12-26 ENCOUNTER — Other Ambulatory Visit: Payer: Self-pay

## 2014-12-26 ENCOUNTER — Other Ambulatory Visit (HOSPITAL_BASED_OUTPATIENT_CLINIC_OR_DEPARTMENT_OTHER): Payer: 59

## 2014-12-26 ENCOUNTER — Other Ambulatory Visit: Payer: Self-pay | Admitting: Oncology

## 2014-12-26 ENCOUNTER — Other Ambulatory Visit: Payer: 59

## 2014-12-26 ENCOUNTER — Ambulatory Visit: Payer: Self-pay | Admitting: Nurse Practitioner

## 2014-12-26 ENCOUNTER — Ambulatory Visit (HOSPITAL_BASED_OUTPATIENT_CLINIC_OR_DEPARTMENT_OTHER): Payer: 59 | Admitting: Nurse Practitioner

## 2014-12-26 ENCOUNTER — Other Ambulatory Visit: Payer: BC Managed Care – PPO

## 2014-12-26 ENCOUNTER — Other Ambulatory Visit: Payer: Self-pay | Admitting: Nurse Practitioner

## 2014-12-26 VITALS — BP 139/94 | HR 90 | Temp 98.2°F | Resp 18 | Ht 64.0 in | Wt 194.8 lb

## 2014-12-26 DIAGNOSIS — C770 Secondary and unspecified malignant neoplasm of lymph nodes of head, face and neck: Secondary | ICD-10-CM

## 2014-12-26 DIAGNOSIS — Z8673 Personal history of transient ischemic attack (TIA), and cerebral infarction without residual deficits: Secondary | ICD-10-CM

## 2014-12-26 DIAGNOSIS — C787 Secondary malignant neoplasm of liver and intrahepatic bile duct: Secondary | ICD-10-CM

## 2014-12-26 DIAGNOSIS — Z5111 Encounter for antineoplastic chemotherapy: Secondary | ICD-10-CM

## 2014-12-26 DIAGNOSIS — C50919 Malignant neoplasm of unspecified site of unspecified female breast: Secondary | ICD-10-CM

## 2014-12-26 DIAGNOSIS — C50312 Malignant neoplasm of lower-inner quadrant of left female breast: Secondary | ICD-10-CM

## 2014-12-26 DIAGNOSIS — R51 Headache: Secondary | ICD-10-CM

## 2014-12-26 DIAGNOSIS — G8929 Other chronic pain: Secondary | ICD-10-CM

## 2014-12-26 LAB — CBC WITH DIFFERENTIAL/PLATELET
BASO%: 0.7 % (ref 0.0–2.0)
BASOS ABS: 0 10*3/uL (ref 0.0–0.1)
EOS%: 1.1 % (ref 0.0–7.0)
Eosinophils Absolute: 0.1 10*3/uL (ref 0.0–0.5)
HCT: 40.6 % (ref 34.8–46.6)
HEMOGLOBIN: 13.3 g/dL (ref 11.6–15.9)
LYMPH%: 27.1 % (ref 14.0–49.7)
MCH: 31.5 pg (ref 25.1–34.0)
MCHC: 32.8 g/dL (ref 31.5–36.0)
MCV: 96.2 fL (ref 79.5–101.0)
MONO#: 0.3 10*3/uL (ref 0.1–0.9)
MONO%: 6.9 % (ref 0.0–14.0)
NEUT#: 3 10*3/uL (ref 1.5–6.5)
NEUT%: 64.2 % (ref 38.4–76.8)
Platelets: 185 10*3/uL (ref 145–400)
RBC: 4.22 10*6/uL (ref 3.70–5.45)
RDW: 13.7 % (ref 11.2–14.5)
WBC: 4.6 10*3/uL (ref 3.9–10.3)
lymph#: 1.3 10*3/uL (ref 0.9–3.3)

## 2014-12-26 MED ORDER — HEPARIN SOD (PORK) LOCK FLUSH 100 UNIT/ML IV SOLN
500.0000 [IU] | Freq: Once | INTRAVENOUS | Status: AC | PRN
  Administered 2014-12-26: 500 [IU]
  Filled 2014-12-26: qty 5

## 2014-12-26 MED ORDER — SODIUM CHLORIDE 0.9 % IJ SOLN
10.0000 mL | INTRAMUSCULAR | Status: DC | PRN
  Administered 2014-12-26: 10 mL
  Filled 2014-12-26: qty 10

## 2014-12-26 MED ORDER — SODIUM CHLORIDE 0.9 % IV SOLN
1.5000 mg/m2 | Freq: Once | INTRAVENOUS | Status: AC
  Administered 2014-12-26: 3 mg via INTRAVENOUS
  Filled 2014-12-26: qty 6

## 2014-12-26 MED ORDER — ONDANSETRON 8 MG/NS 50 ML IVPB
INTRAVENOUS | Status: AC
  Filled 2014-12-26: qty 8

## 2014-12-26 MED ORDER — DEXAMETHASONE SODIUM PHOSPHATE 10 MG/ML IJ SOLN
INTRAMUSCULAR | Status: AC
  Filled 2014-12-26: qty 1

## 2014-12-26 MED ORDER — DEXAMETHASONE SODIUM PHOSPHATE 10 MG/ML IJ SOLN
10.0000 mg | Freq: Once | INTRAMUSCULAR | Status: AC
  Administered 2014-12-26: 10 mg via INTRAVENOUS

## 2014-12-26 MED ORDER — SODIUM CHLORIDE 0.9 % IV SOLN
Freq: Once | INTRAVENOUS | Status: AC
  Administered 2014-12-26: 15:00:00 via INTRAVENOUS

## 2014-12-26 MED ORDER — ONDANSETRON 8 MG/50ML IVPB (CHCC)
8.0000 mg | Freq: Once | INTRAVENOUS | Status: AC
  Administered 2014-12-26: 8 mg via INTRAVENOUS

## 2014-12-26 NOTE — Patient Instructions (Signed)
[facility] Discharge Instructions for Patients Receiving Chemotherapy  Today you received the following chemotherapy agents Halaven.   To help prevent nausea and vomiting after your treatment, we encourage you to take your nausea medication as directed.    If you develop nausea and vomiting that is not controlled by your nausea medication, call the clinic.   BELOW ARE SYMPTOMS THAT SHOULD BE REPORTED IMMEDIATELY:  *FEVER GREATER THAN 100.5 F  *CHILLS WITH OR WITHOUT FEVER  NAUSEA AND VOMITING THAT IS NOT CONTROLLED WITH YOUR NAUSEA MEDICATION  *UNUSUAL SHORTNESS OF BREATH  *UNUSUAL BRUISING OR BLEEDING  TENDERNESS IN MOUTH AND THROAT WITH OR WITHOUT PRESENCE OF ULCERS  *URINARY PROBLEMS  *BOWEL PROBLEMS  UNUSUAL RASH Items with * indicate a potential emergency and should be followed up as soon as possible.  Feel free to call the clinic you have any questions or concerns. The clinic phone number is [phone].  Eribulin solution for injection What is this medicine? ERIBULIN is a chemotherapy drug. It is used to treat breast cancer. This medicine may be used for other purposes; ask your health care provider or pharmacist if you have questions. COMMON BRAND NAME(S): Halaven What should I tell my health care provider before I take this medicine? They need to know if you have any of these conditions: -heart disease -kidney disease -liver disease -low blood counts, like low white cell, platelet, or red cell counts -an unusual or allergic reaction to eribulin, other medicines, foods, dyes, or preservatives -pregnant or trying to get pregnant -breast-feeding How should I use this medicine? This medicine is for infusion into a vein. It is given by a health care professional in a hospital or clinic setting. Talk to your pediatrician regarding the use of this medicine in children. Special care may be needed. Overdosage: If you think you've taken too much of  this medicine contact a poison control center or emergency room at once. Overdosage: If you think you have taken too much of this medicine contact a poison control center or emergency room at once. NOTE: This medicine is only for you. Do not share this medicine with others. What if I miss a dose? It is important not to miss your dose. Call your doctor or health care professional if you are unable to keep an appointment. What may interact with this medicine? Do not take this medicine with any of the following medications: -amiodarone -astemizole -arsenic trioxide -bepridil -bretylium -chloroquine -chlorpromazine -cisapride -clarithromycin -dextromethorphan, quinidine -disopyramide -dofetilide -droperidol -dronedarone -erythromycin -grepafloxacin -halofantrine -haloperidol -ibutilide -levomethadyl -mesoridazine -methadone -pentamidine -procainamide -quinidine -pimozide -posaconazole -probucol -propafenone -saquinavir -sotalol -sparfloxacin -terfenadine -thioridazine -troleandomycin -ziprasidone This list may not describe all possible interactions. Give your health care provider a list of all the medicines, herbs, non-prescription drugs, or dietary supplements you use. Also tell them if you smoke, drink alcohol, or use illegal drugs. Some items may interact with your medicine. What should I watch for while using this medicine? Your condition will be monitored carefully while you are receiving this medicine. This drug may make you feel generally unwell. This is not uncommon, as chemotherapy can affect healthy cells as well as cancer cells. Report any side effects. Continue your course of treatment even though you feel ill unless your doctor tells you to stop. Call your doctor or health care professional for advice if you get a fever, chills or sore throat, or other symptoms of a cold or flu. Do not treat yourself. This drug decreases your body's   ability to fight infections.  Try to avoid being around people who are sick. This medicine may increase your risk to bruise or bleed. Call your doctor or health care professional if you notice any unusual bleeding. Be careful brushing and flossing your teeth or using a toothpick because you may get an infection or bleed more easily. If you have any dental work done, tell your dentist you are receiving this medicine. Avoid taking products that contain aspirin, acetaminophen, ibuprofen, naproxen, or ketoprofen unless instructed by your doctor. These medicines may hide a fever. Do not become pregnant while taking this medicine. Women should inform their doctor if they wish to become pregnant or think they might be pregnant. There is a potential for serious side effects to an unborn child. Talk to your health care professional or pharmacist for more information. Do not breast-feed an infant while taking this medicine. What side effects may I notice from receiving this medicine? Side effects that you should report to your doctor or health care professional as soon as possible: -allergic reactions like skin rash, itching or hives, swelling of the face, lips, or tongue -low blood counts - this medicine may decrease the number of white blood cells, red blood cells and platelets. You may be at increased risk for infections and bleeding. -signs of infection - fever or chills, cough, sore throat, pain or difficulty passing urine -signs of decreased platelets or bleeding - bruising, pinpoint red spots on the skin, black, tarry stools, blood in the urine -signs of decreased red blood cells - unusually weak or tired, fainting spells, lightheadedness -pain, tingling, numbness in the hands or feet Side effects that usually do not require medical attention (Report these to your doctor or health care professional if they continue or are bothersome.): -constipation -hair loss -headache -loss of appetite -muscle or joint pain -nausea,  vomiting -stomach pain This list may not describe all possible side effects. Call your doctor for medical advice about side effects. You may report side effects to FDA at 1-800-FDA-1088. Where should I keep my medicine? This drug is given in a hospital or clinic and will not be stored at home. NOTE: This sheet is a summary. It may not cover all possible information. If you have questions about this medicine, talk to your doctor, pharmacist, or health care provider.  2015, [employer]. (2009-11-19 23:04:37)     

## 2014-12-29 ENCOUNTER — Telehealth: Payer: Self-pay | Admitting: *Deleted

## 2014-12-29 NOTE — Telephone Encounter (Signed)
-----   Message from Cathlean Cower, RN sent at 12/26/2014  3:52 PM EST ----- Regarding: chemo f/u call 1st time [facility] 12/26/14.  [name].

## 2014-12-29 NOTE — Telephone Encounter (Signed)
Spoke with patient,states she is fine, eating and drinking. No c/o.

## 2014-12-30 ENCOUNTER — Encounter: Payer: Self-pay | Admitting: Nurse Practitioner

## 2014-12-30 ENCOUNTER — Telehealth: Payer: Self-pay | Admitting: Nurse Practitioner

## 2014-12-30 ENCOUNTER — Telehealth: Payer: Self-pay

## 2014-12-30 NOTE — Progress Notes (Signed)
SYMPTOM MANAGEMENT CLINIC   HPI: Kristin Hoffman 49 y.o. female diagnosed with breast cancer with liver metastasis.  Patient is status post Aromasin and Ibrance therapy.  Here today to initiate Eribulin chemotherapy regimen.  Patient is status post bilateral mastectomies in 2011; as well as Aromasin and Ibrance therapy.  Here today to initiate eribulin therapy.  The plan is to receive the Eribulin therapy on days #1 and 8 of an every 21 day cycle.  We'll obtain restaging scans after 3-4 cycles.  Patient admits to feeling fairly well within the past few weeks.  She denies any new symptoms whatsoever.  She denies any recent fevers or chills.  She does admit to some anxiety regarding the initiation of immunotherapy however.  Also, patient signed foundation 1 testing paperwork today.  HPI  CURRENT THERAPY: Upcoming Treatment Dates - BREAST METASTATIC Eribulin q21d Days with orders from any treatment category:  01/02/2015      SCHEDULING COMMUNICATION      ondansetron (ZOFRAN) IVPB 8 mg      dexamethasone (DECADRON) injection 10 mg      eriBULin mesylate (HALAVEN) 2.8 mg in sodium chloride 0.9 % 100 mL chemo infusion      sodium chloride 0.9 % injection 10 mL      heparin lock flush 100 unit/mL      heparin lock flush 100 unit/mL      alteplase (CATHFLO ACTIVASE) injection 2 mg      sodium chloride 0.9 % injection 3 mL      0.9 %  sodium chloride infusion      TREATMENT CONDITIONS 01/16/2015      SCHEDULING COMMUNICATION      ondansetron (ZOFRAN) IVPB 8 mg      dexamethasone (DECADRON) injection 10 mg      eriBULin mesylate (HALAVEN) 2.8 mg in sodium chloride 0.9 % 100 mL chemo infusion      sodium chloride 0.9 % injection 10 mL      heparin lock flush 100 unit/mL      heparin lock flush 100 unit/mL      alteplase (CATHFLO ACTIVASE) injection 2 mg      sodium chloride 0.9 % injection 3 mL      0.9 %  sodium chloride infusion      TREATMENT CONDITIONS 01/23/2015      SCHEDULING  COMMUNICATION      ondansetron (ZOFRAN) IVPB 8 mg      dexamethasone (DECADRON) injection 10 mg      eriBULin mesylate (HALAVEN) 2.8 mg in sodium chloride 0.9 % 100 mL chemo infusion      sodium chloride 0.9 % injection 10 mL      heparin lock flush 100 unit/mL      heparin lock flush 100 unit/mL      alteplase (CATHFLO ACTIVASE) injection 2 mg      sodium chloride 0.9 % injection 3 mL      0.9 %  sodium chloride infusion      TREATMENT CONDITIONS    ROS  Past Medical History  Diagnosis Date  . Breast cancer   . Depression   . Reflux   . Cancer   . Hx-TIA (transient ischemic attack) 11/22/2013  . Chronic headaches 11/22/2013  . Anxiety 11/22/2013  . Breast cancer metastasized to multiple sites 12/24/2013    Past Surgical History  Procedure Laterality Date  . Mastectomy w/ nodes partial    . Mastectomy    . Breast reconstruction    .  Portacath placement      x 2  . Portacath removal      x 2  . Abdominal hysterectomy    . Tee without cardioversion  11/12/2012    Procedure: TRANSESOPHAGEAL ECHOCARDIOGRAM (TEE);  Surgeon: Candee Furbish, MD;  Location: [facility] ENDOSCOPY;  Service: Cardiovascular;  Laterality: N/A;  This TEE may be Dr. Marlou Porch or a LHC Dr    has Hx-TIA (transient ischemic attack); Chronic headaches; Anxiety; Cancer of lower-inner quadrant of left female breast; Breast cancer metastasized to multiple sites; and Drug induced neutropenia(288.03) on her problem list.    has No Known Allergies.    Medication List       This list is accurate as of: 12/26/14 11:59 PM.  Always use your most recent med list.               acidophilus Caps capsule  Take 1 capsule by mouth daily.     aspirin 81 MG tablet  Take 81 mg by mouth daily.     cetirizine 10 MG tablet  Commonly known as:  ZYRTEC  Take 10 mg by mouth daily.     Cholecalciferol 2000 UNITS Chew  Chew 1 tablet by mouth daily.     dexamethasone 4 MG tablet  Commonly known as:  DECADRON  Take 2 tablets (8  mg total) by mouth 2 (two) times daily with a meal. Start the day after chemotherapy for 2 days. Take with food.     escitalopram 20 MG tablet  Commonly known as:  LEXAPRO  Take 20 mg by mouth daily with breakfast.     lidocaine-prilocaine cream  Commonly known as:  EMLA  Apply to affected area once     LORazepam 0.5 MG tablet  Commonly known as:  ATIVAN  Take 1 tablet (0.5 mg total) by mouth at bedtime as needed (Nausea or vomiting).     Melatonin 10 MG Tabs  Take 1 tablet by mouth at bedtime.     metoCLOPramide 5 MG tablet  Commonly known as:  REGLAN  Take 5 mg by mouth 4 (four) times daily.     montelukast 10 MG tablet  Commonly known as:  SINGULAIR  Take 10 mg by mouth daily.     multivitamin tablet  Take 1 tablet by mouth daily.     omeprazole 40 MG capsule  Commonly known as:  PRILOSEC  Take 40 mg by mouth daily.     ondansetron 8 MG tablet  Commonly known as:  ZOFRAN  Take 1 tablet (8 mg total) by mouth 2 (two) times daily. Start the day after chemo for 2 days. Then take as needed for nausea or vomiting.     OVER THE COUNTER MEDICATION  Take 2 capsules by mouth 3 (three) times daily.     OVER THE COUNTER MEDICATION  Take 2 drops by mouth 2 (two) times daily.     OVER THE COUNTER MEDICATION  Take 1 drop by mouth daily as needed (FOR ACID REFLUX).     prochlorperazine 10 MG tablet  Commonly known as:  COMPAZINE  Take 1 tablet (10 mg total) by mouth every 6 (six) hours as needed (Nausea or vomiting).     vitamin C 500 MG tablet  Commonly known as:  ASCORBIC ACID  Take 500 mg by mouth daily.         PHYSICAL EXAMINATION  Blood pressure 139/94, pulse 90, temperature 98.2 F (36.8 C), temperature source Oral, resp. rate 18, height 5'  4" (1.626 m), weight 194 lb 12.8 oz (88.361 kg), SpO2 100 %.  Physical Exam  Constitutional: She is oriented to person, place, and time and well-developed, well-nourished, and in no distress.  HENT:  Head: Normocephalic  and atraumatic.  Mouth/Throat: Oropharynx is clear and moist.  Eyes: Conjunctivae and EOM are normal. Pupils are equal, round, and reactive to light. Right eye exhibits no discharge. Left eye exhibits no discharge. No scleral icterus.  Neck: Normal range of motion. Neck supple. No JVD present. No tracheal deviation present. No thyromegaly present.  Cardiovascular: Normal rate, regular rhythm, normal heart sounds and intact distal pulses.   Pulmonary/Chest: Effort normal and breath sounds normal. No respiratory distress. She has no wheezes. She has no rales. She exhibits no tenderness.  Bilateral mastectomy scars well healed.  Bilateral breast reconstruction noted.  Right upper chest Port-A-Cath intact with no evidence of infection.  Abdominal: Soft. Bowel sounds are normal. She exhibits no distension and no mass. There is no tenderness. There is no rebound and no guarding.  Musculoskeletal: Normal range of motion. She exhibits no edema.  Lymphadenopathy:    She has no cervical adenopathy.  Neurological: She is alert and oriented to person, place, and time. Gait normal.  Skin: Skin is warm and dry. No rash noted. No erythema.  Psychiatric:  Appears slightly anxious today.  Nursing note and vitals reviewed.   LABORATORY DATA:. Appointment on 12/26/2014  Component Date Value Ref Range Status  . WBC 12/26/2014 4.6  3.9 - 10.3 10e3/uL Final  . NEUT# 12/26/2014 3.0  1.5 - 6.5 10e3/uL Final  . HGB 12/26/2014 13.3  11.6 - 15.9 g/dL Final  . HCT 12/26/2014 40.6  34.8 - 46.6 % Final  . Platelets 12/26/2014 185  145 - 400 10e3/uL Final  . MCV 12/26/2014 96.2  79.5 - 101.0 fL Final  . MCH 12/26/2014 31.5  25.1 - 34.0 pg Final  . MCHC 12/26/2014 32.8  31.5 - 36.0 g/dL Final  . RBC 12/26/2014 4.22  3.70 - 5.45 10e6/uL Final  . RDW 12/26/2014 13.7  11.2 - 14.5 % Final  . lymph# 12/26/2014 1.3  0.9 - 3.3 10e3/uL Final  . MONO# 12/26/2014 0.3  0.1 - 0.9 10e3/uL Final  . Eosinophils Absolute  12/26/2014 0.1  0.0 - 0.5 10e3/uL Final  . Basophils Absolute 12/26/2014 0.0  0.0 - 0.1 10e3/uL Final  . NEUT% 12/26/2014 64.2  38.4 - 76.8 % Final  . LYMPH% 12/26/2014 27.1  14.0 - 49.7 % Final  . MONO% 12/26/2014 6.9  0.0 - 14.0 % Final  . EOS% 12/26/2014 1.1  0.0 - 7.0 % Final  . BASO% 12/26/2014 0.7  0.0 - 2.0 % Final     RADIOGRAPHIC STUDIES: No results found.  ASSESSMENT/PLAN:    Cancer of lower-inner quadrant of left female breast Patient is status post bilateral mastectomies in February 2011.  She underwent a left latissimus flap and a right breast implant following the mastectomies.  She is also status post Aromasin and Ibrance therapy.  Recent restaging scans did reveal progression; and patient presents to the [facility] today to initiate Eribulin therapy.  Reviewed blood count with patient today; and patient will proceed today with her first cycle of the Eribulin.  Patient has plans to return on Friday, 01/02/2015 for cycle 1, day 8 of the same regimen.  Also, patient signed all paperwork for [employer] 1 testing.    Patient stated understanding of all instructions; and was in agreement with this  plan of care. The patient knows to call the clinic with any problems, questions or concerns.  Reviewed all with [name] regarding today's visit.   Total time spent with patient was 25 minutes;  with greater than 75 percent of that time spent in face to face counseling regarding her symptoms, and coordination of care and follow up.  Disclaimer: This note was dictated with voice recognition software. Similar sounding words can inadvertently be transcribed and may not be corrected upon review.   Drue Second, NP 12/30/2014

## 2014-12-30 NOTE — Assessment & Plan Note (Signed)
Patient is status post bilateral mastectomies in February 2011.  She underwent a left latissimus flap and a right breast implant following the mastectomies.  She is also status post Aromasin and Ibrance therapy.  Recent restaging scans did reveal progression; and patient presents to the [facility] today to initiate Eribulin therapy.  Reviewed blood count with patient today; and patient will proceed today with her first cycle of the Eribulin.  Patient has plans to return on Friday, 01/02/2015 for cycle 1, day 8 of the same regimen.  Also, patient signed all paperwork for [employer] 1 testing.

## 2014-12-30 NOTE — Telephone Encounter (Signed)
pt cld wanting to change appt for 1/22-adv pt HF has no other appts to move to that days only next week. Pt stated she understood

## 2014-12-30 NOTE — Telephone Encounter (Signed)
error 

## 2015-01-01 ENCOUNTER — Other Ambulatory Visit: Payer: Self-pay

## 2015-01-01 ENCOUNTER — Ambulatory Visit: Payer: Self-pay | Admitting: Nurse Practitioner

## 2015-01-01 ENCOUNTER — Ambulatory Visit: Payer: 59

## 2015-01-02 ENCOUNTER — Ambulatory Visit: Payer: 59

## 2015-01-02 ENCOUNTER — Ambulatory Visit: Payer: Self-pay | Admitting: Nurse Practitioner

## 2015-01-02 ENCOUNTER — Telehealth: Payer: Self-pay | Admitting: Nurse Practitioner

## 2015-01-02 ENCOUNTER — Other Ambulatory Visit: Payer: Self-pay

## 2015-01-02 NOTE — Telephone Encounter (Signed)
pt cld to cx lab/MD/chemo-sent email to desk nurse,HF,Tanya,MW

## 2015-01-06 ENCOUNTER — Telehealth: Payer: Self-pay | Admitting: *Deleted

## 2015-01-06 NOTE — Telephone Encounter (Signed)
Pt left message in triage stating " I missed my chemo last week and no one called to tell me to reschedule for this week "  Pt is inquiring if she should get chemo this week- " this would be my week off "  This RN returned call pt and discussed above - pt is ok not doing treatment this week and picking schedule back up as planned 01/15/2015.  No other needs at this time.

## 2015-01-08 ENCOUNTER — Emergency Department (INDEPENDENT_AMBULATORY_CARE_PROVIDER_SITE_OTHER): Payer: 59

## 2015-01-08 ENCOUNTER — Encounter: Payer: Self-pay | Admitting: Emergency Medicine

## 2015-01-08 ENCOUNTER — Emergency Department (INDEPENDENT_AMBULATORY_CARE_PROVIDER_SITE_OTHER)
Admission: EM | Admit: 2015-01-08 | Discharge: 2015-01-08 | Disposition: A | Discharge status: Home / Self Care | Payer: 59 | Source: Home / Self Care | Attending: Family Medicine | Admitting: Family Medicine

## 2015-01-08 ENCOUNTER — Telehealth: Payer: Self-pay | Admitting: *Deleted

## 2015-01-08 DIAGNOSIS — M25531 Pain in right wrist: Secondary | ICD-10-CM

## 2015-01-08 DIAGNOSIS — M79609 Pain in unspecified limb: Secondary | ICD-10-CM

## 2015-01-08 DIAGNOSIS — IMO0001 Reserved for inherently not codable concepts without codable children: Secondary | ICD-10-CM

## 2015-01-08 NOTE — ED Provider Notes (Signed)
Kristin Hoffman is a 49 y.o. female who presents to Urgent Care today for right wrist injury. Patient tripped and fell onto her right wrist at church yesterday evening. She notes pain in the wrist worse at the distal radius and anatomical snuff box. No radiating pain weakness or numbness. She's tried some ibuprofen and ice which have helped. 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    Past Medical History  Diagnosis Date  . Breast cancer   . Depression   . Reflux   . Cancer   . Hx-TIA (transient ischemic attack) 11/22/2013  . Chronic headaches 11/22/2013  . Anxiety 11/22/2013  . Breast cancer metastasized to multiple sites 12/24/2013   Past Surgical History  Procedure Laterality Date  . Mastectomy w/ nodes partial    . Mastectomy    . Breast reconstruction    . Portacath placement      x 2  . Portacath removal      x 2  . Abdominal hysterectomy    . Tee without cardioversion  11/12/2012    Procedure: TRANSESOPHAGEAL ECHOCARDIOGRAM (TEE);  Surgeon: Candee Furbish, MD;  Location: [facility] ENDOSCOPY;  Service: Cardiovascular;  Laterality: N/A;  This TEE may be Dr. Marlou Porch or a LHC Dr   History  Substance Use Topics  . Smoking status: Never Smoker   . Smokeless tobacco: Never Used  . Alcohol Use: Yes     Comment: rarely   ROS as above Medications: No current facility-administered medications for this encounter.   Current Outpatient Prescriptions  Medication Sig Dispense Refill  . acidophilus (RISAQUAD) CAPS capsule Take 1 capsule by mouth daily.    Marland Kitchen aspirin 81 MG tablet Take 81 mg by mouth daily.    . cetirizine (ZYRTEC) 10 MG tablet Take 10 mg by mouth daily.      . Cholecalciferol 2000 UNITS CHEW Chew 1 tablet by mouth daily.    Marland Kitchen dexamethasone (DECADRON) 4 MG tablet Take 2 tablets (8 mg total) by mouth 2 (two) times daily with a meal. Start the day after chemotherapy for 2 days. Take with food. 30 tablet 1  . escitalopram (LEXAPRO) 20 MG  tablet Take 20 mg by mouth daily with breakfast.     . lidocaine-prilocaine (EMLA) cream Apply to affected area once 30 g 3  . LORazepam (ATIVAN) 0.5 MG tablet Take 1 tablet (0.5 mg total) by mouth at bedtime as needed (Nausea or vomiting). 30 tablet 0  . Melatonin 10 MG TABS Take 1 tablet by mouth at bedtime.    . metoCLOPramide (REGLAN) 5 MG tablet Take 5 mg by mouth 4 (four) times daily.    . montelukast (SINGULAIR) 10 MG tablet Take 10 mg by mouth daily.    . Multiple Vitamin (MULTIVITAMIN) tablet Take 1 tablet by mouth daily.    Marland Kitchen omeprazole (PRILOSEC) 40 MG capsule Take 40 mg by mouth daily.     . ondansetron (ZOFRAN) 8 MG tablet Take 1 tablet (8 mg total) by mouth 2 (two) times daily. Start the day after chemo for 2 days. Then take as needed for nausea or vomiting. 30 tablet 1  . OVER THE COUNTER MEDICATION Take 2 capsules by mouth 3 (three) times daily.    Marland Kitchen OVER THE COUNTER MEDICATION Take 2 drops by mouth 2 (two) times daily.    Marland Kitchen OVER THE COUNTER MEDICATION Take 1 drop by mouth daily as needed (FOR ACID REFLUX).    Marland Kitchen prochlorperazine (COMPAZINE)  10 MG tablet Take 1 tablet (10 mg total) by mouth every 6 (six) hours as needed (Nausea or vomiting). 30 tablet 1  . vitamin C (ASCORBIC ACID) 500 MG tablet Take 500 mg by mouth daily.     Not on File   Exam:  BP 138/85 mmHg  Pulse 69  Temp(Src) 98 F (36.7 C) (Oral)  Ht 5\' 3"  (1.6 m)  Wt 193 lb (87.544 kg)  BMI 34.20 kg/m2  SpO2 98% Gen: Well NAD Right arm: Is normal appearing and nontender with normal motion. Wrist is normal-appearing without significant swelling or ecchymosis. No deformities visible. Mildly tender to palpation over the distal radius and anatomical snuff box. Wrist motion is intact. Pulses capillary refill sensation and motion are intact.  No results found for this or any previous visit (from the past 24 hour(s)). Dg Wrist Complete Right  01/08/2015   CLINICAL DATA:  Acute right wrist pain after falling last  night. Initial encounter.  EXAM: RIGHT WRIST - COMPLETE 3+ VIEW  COMPARISON:  None.  FINDINGS: There is no evidence of fracture or dislocation. There is no evidence of arthropathy or other focal bone abnormality. Soft tissues are unremarkable.  IMPRESSION: Normal right wrist.   Electronically Signed   By: Sabino Dick M.D.   On: 01/08/2015 12:00    Assessment and Plan: 49 y.o. female with wrist injury likely sprain. Treat with thumb spica splint and NSAIDs. Return in one week for repeat x-ray and less 100% better. There is some concern for possible radiographically occult scaphoid fracture with the location of her pain and the nature of her fall.  Discussed warning signs or symptoms. Please see discharge instructions. Patient expresses understanding.     Gregor Hams, MD 01/08/15 [phone]

## 2015-01-08 NOTE — Discharge Instructions (Signed)
Thank you for coming in today. Use the wrist brace Take ibuprofen or Aleve or Tylenol for pain control Return in one week if not better  Wrist Sprain with Rehab A sprain is an injury in which a ligament that maintains the proper alignment of a joint is partially or completely torn. The ligaments of the wrist are susceptible to sprains. Sprains are classified into three categories. Grade 1 sprains cause pain, but the tendon is not lengthened. Grade 2 sprains include a lengthened ligament because the ligament is stretched or partially ruptured. With grade 2 sprains there is still function, although the function may be diminished. Grade 3 sprains are characterized by a complete tear of the tendon or muscle, and function is usually impaired. SYMPTOMS   Pain tenderness, inflammation, and/or bruising (contusion) of the injury.  A "pop" or tear felt and/or heard at the time of injury.  Decreased wrist function. CAUSES  A wrist sprain occurs when a force is placed on one or more ligaments that is greater than it/they can withstand. Common mechanisms of injury include:  Catching a ball with you hands.  Repetitive and/ or strenuous extension or flexion of the wrist. RISK INCREASES WITH:  Previous wrist injury.  Contact sports (boxing or wrestling).  Activities in which falling is common.  Poor strength and flexibility.  Improperly fitted or padded protective equipment. PREVENTION  Warm up and stretch properly before activity.  Allow for adequate recovery between workouts.  Maintain physical fitness:  Strength, flexibility, and endurance.  Cardiovascular fitness.  Protect the wrist joint by limiting its motion with the use of taping, braces, or splints.  Protect the wrist after injury for 6 to 12 months. PROGNOSIS  The prognosis for wrist sprains depends on the degree of injury. Grade 1 sprains require 2 to 6 weeks of treatment. Grade 2 sprains require 6 to 8 weeks of treatment,  and grade 3 sprains require up to 12 weeks.  RELATED COMPLICATIONS   Prolonged healing time, if improperly treated or re-injured.  Recurrent symptoms that result in a chronic problem.  Injury to nearby structures (bone, cartilage, nerves, or tendons).  Arthritis of the wrist.  Inability to compete in athletics at a high level.  Wrist stiffness or weakness.  Progression to a complete rupture of the ligament. TREATMENT  Treatment initially involves resting from any activities that aggravate the symptoms, and the use of ice and medications to help reduce pain and inflammation. Your caregiver may recommend immobilizing the wrist for a period of time in order to reduce stress on the ligament and allow for healing. After immobilization it is important to perform strengthening and stretching exercises to help regain strength and a full range of motion. These exercises may be completed at home or with a therapist. Surgery is not usually required for wrist sprains, unless the ligament has been ruptured (grade 3 sprain). MEDICATION   If pain medication is necessary, then nonsteroidal anti-inflammatory medications, such as aspirin and ibuprofen, or other minor pain relievers, such as acetaminophen, are often recommended.  Do not take pain medication for 7 days before surgery.  Prescription pain relievers may be given if deemed necessary by your caregiver. Use only as directed and only as much as you need. HEAT AND COLD  Cold treatment (icing) relieves pain and reduces inflammation. Cold treatment should be applied for 10 to 15 minutes every 2 to 3 hours for inflammation and pain and immediately after any activity that aggravates your symptoms. Use ice packs or  massage the area with a piece of ice (ice massage).  Heat treatment may be used prior to performing the stretching and strengthening activities prescribed by your caregiver, physical therapist, or athletic trainer. Use a heat pack or soak  your injury in warm water. SEEK MEDICAL CARE IF:  Treatment seems to offer no benefit, or the condition worsens.  Any medications produce adverse side effects. EXERCISES RANGE OF MOTION (ROM) AND STRETCHING EXERCISES - Wrist Sprain  These exercises may help you when beginning to rehabilitate your injury. Your symptoms may resolve with or without further involvement from your physician, physical therapist or athletic trainer. While completing these exercises, remember:   Restoring tissue flexibility helps normal motion to return to the joints. This allows healthier, less painful movement and activity.  An effective stretch should be held for at least 30 seconds.  A stretch should never be painful. You should only feel a gentle lengthening or release in the stretched tissue. RANGE OF MOTION - Wrist Flexion, Active-Assisted  Extend your right / left elbow with your fingers pointing down.*  Gently pull the back of your hand towards you until you feel a gentle stretch on the top of your forearm.  Hold this position for __________ seconds. Repeat __________ times. Complete this exercise __________ times per day.  *If directed by your physician, physical therapist or athletic trainer, complete this stretch with your elbow bent rather than extended. RANGE OF MOTION - Wrist Extension, Active-Assisted  Extend your right / left elbow and turn your palm upwards.*  Gently pull your palm/fingertips back so your wrist extends and your fingers point more toward the ground.  You should feel a gentle stretch on the inside of your forearm.  Hold this position for __________ seconds. Repeat __________ times. Complete this exercise __________ times per day. *If directed by your physician, physical therapist or athletic trainer, complete this stretch with your elbow bent, rather than extended. RANGE OF MOTION - Supination, Active  Stand or sit with your elbows at your side. Bend your right / left  elbow to 90 degrees.  Turn your palm upward until you feel a gentle stretch on the inside of your forearm.  Hold this position for __________ seconds. Slowly release and return to the starting position. Repeat __________ times. Complete this stretch __________ times per day.  RANGE OF MOTION - Pronation, Active  Stand or sit with your elbows at your side. Bend your right / left elbow to 90 degrees.  Turn your palm downward until you feel a gentle stretch on the top of your forearm.  Hold this position for __________ seconds. Slowly release and return to the starting position. Repeat __________ times. Complete this stretch __________ times per day.  STRETCH - Wrist Flexion  Place the back of your right / left hand on a tabletop leaving your elbow slightly bent. Your fingers should point away from your body.  Gently press the back of your hand down onto the table by straightening your elbow. You should feel a stretch on the top of your forearm.  Hold this position for __________ seconds. Repeat __________ times. Complete this stretch __________ times per day.  STRETCH - Wrist Extension  Place your right / left fingertips on a tabletop leaving your elbow slightly bent. Your fingers should point backwards.  Gently press your fingers and palm down onto the table by straightening your elbow. You should feel a stretch on the inside of your forearm.  Hold this position for __________ seconds.  Repeat __________ times. Complete this stretch __________ times per day.  STRENGTHENING EXERCISES - Wrist Sprain These exercises may help you when beginning to rehabilitate your injury. They may resolve your symptoms with or without further involvement from your physician, physical therapist or athletic trainer. While completing these exercises, remember:   Muscles can gain both the endurance and the strength needed for everyday activities through controlled exercises.  Complete these exercises as  instructed by your physician, physical therapist or athletic trainer. Progress with the resistance and repetition exercises only as your caregiver advises. STRENGTH - Wrist Flexors  Sit with your right / left forearm palm-up and fully supported. Your elbow should be resting below the height of your shoulder. Allow your wrist to extend over the edge of the surface.  Loosely holding a __________ weight or a piece of rubber exercise band/tubing, slowly curl your hand up toward your forearm.  Hold this position for __________ seconds. Slowly lower the wrist back to the starting position in a controlled manner. Repeat __________ times. Complete this exercise __________ times per day.  STRENGTH - Wrist Extensors  Sit with your right / left forearm palm-down and fully supported. Your elbow should be resting below the height of your shoulder. Allow your wrist to extend over the edge of the surface.  Loosely holding a __________ weight or a piece of rubber exercise band/tubing, slowly curl your hand up toward your forearm.  Hold this position for __________ seconds. Slowly lower the wrist back to the starting position in a controlled manner. Repeat __________ times. Complete this exercise __________ times per day.  STRENGTH - Ulnar Deviators  Stand with a ____________________ weight in your right / left hand, or sit holding on to the rubber exercise band/tubing with your opposite arm supported.  Move your wrist so that your pinkie travels toward your forearm and your thumb moves away from your forearm.  Hold this position for __________ seconds and then slowly lower the wrist back to the starting position. Repeat __________ times. Complete this exercise __________ times per day STRENGTH - Radial Deviators  Stand with a ____________________ weight in your  right / left hand, or sit holding on to the rubber exercise band/tubing with your arm supported.  Raise your hand upward in front of you or  pull up on the rubber tubing.  Hold this position for __________ seconds and then slowly lower the wrist back to the starting position. Repeat __________ times. Complete this exercise __________ times per day. STRENGTH - Forearm Supinators  Sit with your right / left forearm supported on a table, keeping your elbow below shoulder height. Rest your hand over the edge, palm down.  Gently grip a hammer or a soup ladle.  Without moving your elbow, slowly turn your palm and hand upward to a "thumbs-up" position.  Hold this position for __________ seconds. Slowly return to the starting position. Repeat __________ times. Complete this exercise __________ times per day.  STRENGTH - Forearm Pronators  Sit with your right / left forearm supported on a table, keeping your elbow below shoulder height. Rest your hand over the edge, palm up.  Gently grip a hammer or a soup ladle.  Without moving your elbow, slowly turn your palm and hand upward to a "thumbs-up" position.  Hold this position for __________ seconds. Slowly return to the starting position. Repeat __________ times. Complete this exercise __________ times per day.  STRENGTH - Grip  Grasp a tennis ball, a dense sponge, or a large,  rolled sock in your hand.  Squeeze as hard as you can without increasing any pain.  Hold this position for __________ seconds. Release your grip slowly. Repeat __________ times. Complete this exercise __________ times per day.  Document Released: 11/28/2005 Document Revised: 02/20/2012 Document Reviewed: 03/12/2009 [facility] Patient Information 2015 [place], [place]. This information is not intended to replace advice given to you by your health care provider. Make sure you discuss any questions you have with your health care provider.

## 2015-01-08 NOTE — ED Notes (Signed)
Rt wrist injury last night

## 2015-01-08 NOTE — Telephone Encounter (Signed)
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 She was wondering if she should followup with her PCP or [name]. Told patient to call her PCP for evaluation and if for any reason they are not able to get her in or assist her to please give [place] a call back. Patient agreeable to this.

## 2015-01-09 ENCOUNTER — Other Ambulatory Visit: Payer: BC Managed Care – PPO

## 2015-01-12 ENCOUNTER — Encounter (HOSPITAL_COMMUNITY): Payer: Self-pay

## 2015-01-15 ENCOUNTER — Telehealth: Payer: Self-pay | Admitting: Nurse Practitioner

## 2015-01-15 ENCOUNTER — Ambulatory Visit: Payer: 59

## 2015-01-15 ENCOUNTER — Telehealth: Payer: Self-pay | Admitting: *Deleted

## 2015-01-15 ENCOUNTER — Other Ambulatory Visit (HOSPITAL_BASED_OUTPATIENT_CLINIC_OR_DEPARTMENT_OTHER): Payer: 59

## 2015-01-15 ENCOUNTER — Encounter: Payer: Self-pay | Admitting: Nurse Practitioner

## 2015-01-15 ENCOUNTER — Ambulatory Visit (HOSPITAL_BASED_OUTPATIENT_CLINIC_OR_DEPARTMENT_OTHER): Payer: 59 | Admitting: Nurse Practitioner

## 2015-01-15 VITALS — BP 144/90 | HR 95 | Temp 98.4°F | Resp 18 | Ht 63.0 in | Wt 192.4 lb

## 2015-01-15 DIAGNOSIS — C50912 Malignant neoplasm of unspecified site of left female breast: Secondary | ICD-10-CM

## 2015-01-15 DIAGNOSIS — C50919 Malignant neoplasm of unspecified site of unspecified female breast: Secondary | ICD-10-CM

## 2015-01-15 DIAGNOSIS — C50312 Malignant neoplasm of lower-inner quadrant of left female breast: Secondary | ICD-10-CM

## 2015-01-15 DIAGNOSIS — Z8673 Personal history of transient ischemic attack (TIA), and cerebral infarction without residual deficits: Secondary | ICD-10-CM

## 2015-01-15 DIAGNOSIS — R51 Headache: Secondary | ICD-10-CM

## 2015-01-15 DIAGNOSIS — C787 Secondary malignant neoplasm of liver and intrahepatic bile duct: Secondary | ICD-10-CM

## 2015-01-15 DIAGNOSIS — Z170 Estrogen receptor positive status [ER+]: Secondary | ICD-10-CM

## 2015-01-15 DIAGNOSIS — G8929 Other chronic pain: Secondary | ICD-10-CM

## 2015-01-15 LAB — CBC WITH DIFFERENTIAL/PLATELET
BASO%: 1.1 % (ref 0.0–2.0)
Basophils Absolute: 0.1 10*3/uL (ref 0.0–0.1)
EOS%: 0.1 % (ref 0.0–7.0)
Eosinophils Absolute: 0 10*3/uL (ref 0.0–0.5)
HCT: 41.4 % (ref 34.8–46.6)
HGB: 13.5 g/dL (ref 11.6–15.9)
LYMPH#: 1.2 10*3/uL (ref 0.9–3.3)
LYMPH%: 19.5 % (ref 14.0–49.7)
MCH: 30.6 pg (ref 25.1–34.0)
MCHC: 32.5 g/dL (ref 31.5–36.0)
MCV: 94.3 fL (ref 79.5–101.0)
MONO#: 0.4 10*3/uL (ref 0.1–0.9)
MONO%: 6 % (ref 0.0–14.0)
NEUT#: 4.4 10*3/uL (ref 1.5–6.5)
NEUT%: 73.3 % (ref 38.4–76.8)
Platelets: 199 10*3/uL (ref 145–400)
RBC: 4.4 10*6/uL (ref 3.70–5.45)
RDW: 14.5 % (ref 11.2–14.5)
WBC: 6 10*3/uL (ref 3.9–10.3)

## 2015-01-15 LAB — COMPREHENSIVE METABOLIC PANEL (CC13)
ALBUMIN: 3.7 g/dL (ref 3.5–5.0)
ALT: 24 U/L (ref 0–55)
AST: 26 U/L (ref 5–34)
Alkaline Phosphatase: 67 U/L (ref 40–150)
Anion Gap: 10 mEq/L (ref 3–11)
BUN: 13.5 mg/dL (ref 7.0–26.0)
CALCIUM: 9.3 mg/dL (ref 8.4–10.4)
CO2: 23 meq/L (ref 22–29)
Chloride: 108 mEq/L (ref 98–109)
Creatinine: 1 mg/dL (ref 0.6–1.1)
EGFR: 68 mL/min/{1.73_m2} — ABNORMAL LOW (ref >90)
Glucose: 107 mg/dl (ref 70–140)
POTASSIUM: 3.7 meq/L (ref 3.5–5.1)
SODIUM: 142 meq/L (ref 136–145)
Total Bilirubin: 0.71 mg/dL (ref 0.20–1.20)
Total Protein: 6.6 g/dL (ref 6.4–8.3)

## 2015-01-15 NOTE — Telephone Encounter (Signed)
per pof to sch pt appt-pt req very late trmt times/sent MW email to get the latest pt can get-pt req to move MD to am and come back to trmt-will call after reply

## 2015-01-15 NOTE — Progress Notes (Signed)
ID: Kristin Hoffman OB: 15-Dec-1965  MR#: [id number]  ZDG#:[id number]  PCP: Drake Leach, MD GYN:  [name] SU: [name] OTHER MD: [name]  CHIEF COMPLAINT:  Stage IV breast cancer CURRENT TREATMENT: eribulin  BREAST CANCER HISTORY: This patient was previously followed by [name], and was transferred to [name] service as of 08/20/2013.  At the age of 9, the patient had a screening mammogram as a baseline. She had recently given birth and was on birth control pills at that time. The mammogram in November 2006 showed an area of architectural distortion in the left lower inner quadrant. Subsequently an ultrasound was obtained of the left breast showing a 2.0 x 1.5 x 1.4 cm mass, at the 8:00 position, 4 cm from the nipple. A core biopsy performed on 10/21/2005 showed an invasive mammary carcinoma, ER +70%, PR +41%, HER-2/neu negative, with proliferation marker of 38%. [phone])  Breast MRI on 11/08/2005 confirmed a 2.5 cm spiculated mass in the left lower inner quadrant with 3 small satellite nodules adjacent to the primary mass: 3.5 cm posterior medial to the primary mass, measuring 9 mm; 1.5 cm anterior medial to the primary mass measuring 5 mm; and 2 cm medial to the primary mass measuring 5 mm. No axillary adenopathy was noted. No suspicious masses or enhancement are noted in the right breast.  Kristin Hoffman underwentt left lumpectomy under the care of Dr. 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 2 of 23 lymph nodes were involved.  Margins were clear.  She received adjuvant chemotherapy consisting of 6 q. three-week doses of docetaxel/doxorubicin/cyclophosphamide given between January 2007 and may of 2007, with last dose on 04/20/2006. She underwent radiation therapy completed 07/11/2006, after which she began on tamoxifen in early August 2007.  She underwent hysterectomy  with bilateral salpingo-oophorectomy on 07/15/2008, and was started on letrozole in October of 2009.  A mammogram 11/10/2009 showed microcalcifications in the left breast, and a subsequent biopsy on 11/18/2009 confirmed invasive mammary carcinoma in the left breast. PET and CT of the chest showed no evidence of metastasis in January 2011.  She underwent bilateral mastectomies 01/25/2010, with the right breast showing no evidence of malignancy; in the left breast showing a 1.0 cm grade 2 invasive ductal carcinoma with high-grade DCIS. Tumor was ER +22%, PR negative, HER-2/neu negative, with proliferation marker of 13%. Margins were clear. Patient underwent concurrent left latissimus flap reconstruction and right implant reconstruction.  She received additional chemotherapy with one cycle of carboplatin/gemcitabine given on 03/11/2010. She then received 5 q. three-week cycles of IV CMF between 03/25/2010 and 06/17/2010.  She was started on exemestane in January 2012 and continued until late November 2013 when she was hospitalized for apparent TIA.   Her subsequent history is as detailed below  INTERVAL HISTORY: Kristin Hoffman returns today for followup of her metastatic breast cancer. Today is day 21, cycle 1 of eribulin. Day 8 of treatment was held because of inclement weather. Overall she tolerated the first treatment well. She had some mouth/tongue pain with no true mouth sores, but this was cleared with the use of magic mouthwash. She was constipated for 3 days, but this resolved on its own. She has been using reglan and omeprazole for her indigestion, but this theorizes that the vitamin C supplements she has been taking could be the reason this discomfort has been worse lately. She has occasional hot flashes and  mild vaginal dryness. Her appetite and energy levels are fair. She denies peripheral neuropathy symptoms.  REVIEW OF SYSTEMS: A detailed review of systems is otherwise entirely negative.   PAST  MEDICAL HISTORY: Past Medical History  Diagnosis Date  . Breast cancer   . Depression   . Reflux   . Cancer   . Hx-TIA (transient ischemic attack) 11/22/2013  . Chronic headaches 11/22/2013  . Anxiety 11/22/2013  . Breast cancer metastasized to multiple sites 12/24/2013    PAST SURGICAL HISTORY: Past Surgical History  Procedure Laterality Date  . Mastectomy w/ nodes partial    . Mastectomy    . Breast reconstruction    . Portacath placement      x 2  . Portacath removal      x 2  . Abdominal hysterectomy    . Tee without cardioversion  11/12/2012    Procedure: TRANSESOPHAGEAL ECHOCARDIOGRAM (TEE);  Surgeon: Candee Furbish, MD;  Location: [facility] ENDOSCOPY;  Service: Cardiovascular;  Laterality: N/A;  This TEE may be Dr. Marlou Porch or a LHC Dr    FAMILY HISTORY Both parents are alive and well. Patient has one sister who is 90 years younger and is in good health. No other history of breast or ovarian cancer in the family. No family history on file.  GYNECOLOGIC HISTORY:   (Updated January 2015) G2P2, menarche at age 80 with irregular menses. On birth control pills in the past. On Clomid to induce ovulation with first pregnancy. Also had preeclampsia with first pregnancy. Status post hysterectomy and bilateral salpingo-oophorectomy in August 2009.  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 She is originally from [place]. She's been married to Richards, for 13 years. He works as an Pharmacist, hospital at [employer].   ADVANCED DIRECTIVES:    HEALTH MAINTENANCE:  (Updated 11/22/2013) History  Substance Use Topics  . Smoking status: Never Smoker   . Smokeless tobacco: Never Used  . Alcohol Use: Yes     Comment: rarely     Colonoscopy:  Never  PAP: S/P LAVH/BSO in August 2009  Bone density: Never  Lipid panel: Not on file   Not on File  Current Outpatient Prescriptions  Medication Sig  Dispense Refill  . acidophilus (RISAQUAD) CAPS capsule Take 1 capsule by mouth daily.    Marland Kitchen aspirin 81 MG tablet Take 81 mg by mouth daily.    . cetirizine (ZYRTEC) 10 MG tablet Take 10 mg by mouth daily. Pt takes this med at night    . Cholecalciferol 2000 UNITS CHEW Chew 1 tablet by mouth daily.    Marland Kitchen dexamethasone (DECADRON) 4 MG tablet Take 2 tablets (8 mg total) by mouth 2 (two) times daily with a meal. Start the day after chemotherapy for 2 days. Take with food. 30 tablet 1  . escitalopram (LEXAPRO) 20 MG tablet Take 20 mg by mouth daily with breakfast.     . lidocaine-prilocaine (EMLA) cream Apply to affected area once 30 g 3  . LORazepam (ATIVAN) 0.5 MG tablet Take 1 tablet (0.5 mg total) by mouth at bedtime as needed (Nausea or vomiting). 30 tablet 0  . Melatonin 10 MG TABS Take 1 tablet by mouth at bedtime.    . metoCLOPramide (REGLAN) 5 MG tablet Take 5 mg by mouth 4 (four) times daily.    . montelukast (SINGULAIR) 10 MG tablet Take 10 mg by mouth daily. Pt takes  this med in the morning.    . Multiple Vitamin (MULTIVITAMIN) tablet Take 1 tablet by mouth daily.    Marland Kitchen omeprazole (PRILOSEC) 40 MG capsule Take 40 mg by mouth daily.     . ondansetron (ZOFRAN) 8 MG tablet Take 1 tablet (8 mg total) by mouth 2 (two) times daily. Start the day after chemo for 2 days. Then take as needed for nausea or vomiting. 30 tablet 1  . OVER THE COUNTER MEDICATION Take 2 capsules by mouth 3 (three) times daily.    Marland Kitchen OVER THE COUNTER MEDICATION Take 2 drops by mouth 2 (two) times daily.    Marland Kitchen OVER THE COUNTER MEDICATION Take 1 drop by mouth daily as needed (FOR ACID REFLUX).    Marland Kitchen prochlorperazine (COMPAZINE) 10 MG tablet Take 1 tablet (10 mg total) by mouth every 6 (six) hours as needed (Nausea or vomiting). (Patient not taking: Reported on 01/15/2015) 30 tablet 1   No current facility-administered medications for this visit.    OBJECTIVE: Kristin Hoffman in no acute distress Filed Vitals:   01/15/15 1006   BP: 144/90  Pulse: 95  Temp: 98.4 F (36.9 C)  Resp: 18  Body mass index is 34.09 kg/(m^2).  ECOG: 1 Filed Weights   01/15/15 1006  Weight: 192 lb 6.4 oz (87.272 kg)   Skin: warm, dry  HEENT: sclerae anicteric, conjunctivae pink, oropharynx clear. No thrush or mucositis.  Lymph Nodes: No cervical or supraclavicular lymphadenopathy  Lungs: clear to auscultation bilaterally, no rales, wheezes, or rhonci  Heart: regular rate and rhythm  Abdomen: round, soft, non tender, positive bowel sounds  Musculoskeletal: No focal spinal tenderness, no peripheral edema  Neuro: non focal, well oriented, positive affect  Breasts: deferred  LAB RESULTS:   Lab Results  Component Value Date   WBC 6.0 01/15/2015   NEUTROABS 4.4 01/15/2015   HGB 13.5 01/15/2015   HCT 41.4 01/15/2015   MCV 94.3 01/15/2015   PLT 199 01/15/2015      Chemistry      Component Value Date/Time   NA 142 01/15/2015 0951   NA 142 11/12/2012 0735   K 3.7 01/15/2015 0951   K 3.8 11/12/2012 0735   CL 104 11/14/2012 1407   CL 104 11/12/2012 0735   CO2 23 01/15/2015 0951   CO2 30 11/12/2012 0735   BUN 13.5 01/15/2015 0951   BUN 13 11/12/2012 0735   CREATININE 1.0 01/15/2015 0951   CREATININE 0.97 11/12/2012 0735      Component Value Date/Time   CALCIUM 9.3 01/15/2015 0951   CALCIUM 9.1 11/12/2012 0735   ALKPHOS 67 01/15/2015 0951   ALKPHOS 69 11/12/2012 0735   AST 26 01/15/2015 0951   AST 16 11/12/2012 0735   ALT 24 01/15/2015 0951   ALT 13 11/12/2012 0735   BILITOT 0.71 01/15/2015 0951   BILITOT 0.9 11/12/2012 0735      STUDIES: Dg Wrist Complete Right  01/08/2015   CLINICAL DATA:  Acute right wrist pain after falling last night. Initial encounter.  EXAM: RIGHT WRIST - COMPLETE 3+ VIEW  COMPARISON:  None.  FINDINGS: There is no evidence of fracture or dislocation. There is no evidence of arthropathy or other focal bone abnormality. Soft tissues are unremarkable.  IMPRESSION: Normal right wrist.    Electronically Signed   By: Sabino Dick M.D.   On: 01/08/2015 12:00   Ir Fluoro Guide Cv Line Right  12/17/2014   CLINICAL DATA:  Stage IV metastatic breast carcinoma and  need for porta cath for additional chemotherapy.  EXAM: IMPLANTED PORT A CATH PLACEMENT WITH ULTRASOUND AND FLUOROSCOPIC GUIDANCE  ANESTHESIA/SEDATION: 2.0 Mg IV Versed; 75 mcg IV Fentanyl  Total Moderate Sedation Time:  28 minutes  Additional Medications: 2 g IV Ancef. As antibiotic prophylaxis, Ancef was ordered pre-procedure and administered intravenously within one hour of incision.  FLUOROSCOPY TIME:  30 seconds.  PROCEDURE: The procedure, risks, benefits, and alternatives were explained to the patient. Questions regarding the procedure were encouraged and answered. The patient understands and consents to the procedure.  The right neck and chest were prepped with chlorhexidine in a sterile fashion, and a sterile drape was applied covering the operative field. Maximum barrier sterile technique with sterile gowns and gloves were used for the procedure. Local anesthesia was provided with 1% lidocaine. A time-out procedure was performed.  Ultrasound was used to confirm patency of the right internal jugular vein. After creating a small venotomy incision, a 21 gauge needle was advanced into the right internal jugular vein under direct, real-time ultrasound guidance. Ultrasound image documentation was performed. After securing guidewire access, an 8 Fr dilator was placed. A J-wire was kinked to measure appropriate catheter length.  A subcutaneous port pocket was then created along the upper chest wall utilizing sharp and blunt dissection. Portable cautery was utilized. The pocket was irrigated with sterile saline.  A single lumen power injectable port was chosen for placement. The 8 Fr catheter was tunneled from the port pocket site to the venotomy incision. The port was placed in the pocket. External catheter was trimmed to appropriate length  based on guidewire measurement.  At the venotomy, an 8 Fr peel-away sheath was placed over a guidewire. The catheter was then placed through the sheath and the sheath removed. Final catheter positioning was confirmed and documented with a fluoroscopic spot image. The port was accessed with a needle and aspirated and flushed with heparinized saline. The needle was removed.  The venotomy and port pocket incisions were closed with subcutaneous 3-0 Monocryl and subcuticular 4-0 Vicryl. Dermabond was applied to both incisions.  COMPLICATIONS: None  FINDINGS: After catheter placement, the tip lies at the cavoatrial junction. The catheter aspirates normally and is ready for immediate use.  IMPRESSION: Placement of single lumen port a cath via right internal jugular vein. The catheter tip lies at the cavoatrial junction. A power injectable port a cath was placed and is ready for immediate use.   Electronically Signed   By: Aletta Edouard M.D.   On: 12/17/2014 17:18   Ir US Guide Vasc Access Right  12/17/2014   CLINICAL DATA:  Stage IV metastatic breast carcinoma and need for porta cath for additional chemotherapy.  EXAM: IMPLANTED PORT A CATH PLACEMENT WITH ULTRASOUND AND FLUOROSCOPIC GUIDANCE  ANESTHESIA/SEDATION: 2.0 Mg IV Versed; 75 mcg IV Fentanyl  Total Moderate Sedation Time:  28 minutes  Additional Medications: 2 g IV Ancef. As antibiotic prophylaxis, Ancef was ordered pre-procedure and administered intravenously within one hour of incision.  FLUOROSCOPY TIME:  30 seconds.  PROCEDURE: The procedure, risks, benefits, and alternatives were explained to the patient. Questions regarding the procedure were encouraged and answered. The patient understands and consents to the procedure.  The right neck and chest were prepped with chlorhexidine in a sterile fashion, and a sterile drape was applied covering the operative field. Maximum barrier sterile technique with sterile gowns and gloves were used for the procedure.  Local anesthesia was provided with 1% lidocaine. A time-out procedure was performed.  Ultrasound was used to confirm patency of the right internal jugular vein. After creating a small venotomy incision, a 21 gauge needle was advanced into the right internal jugular vein under direct, real-time ultrasound guidance. Ultrasound image documentation was performed. After securing guidewire access, an 8 Fr dilator was placed. A J-wire was kinked to measure appropriate catheter length.  A subcutaneous port pocket was then created along the upper chest wall utilizing sharp and blunt dissection. Portable cautery was utilized. The pocket was irrigated with sterile saline.  A single lumen power injectable port was chosen for placement. The 8 Fr catheter was tunneled from the port pocket site to the venotomy incision. The port was placed in the pocket. External catheter was trimmed to appropriate length based on guidewire measurement.  At the venotomy, an 8 Fr peel-away sheath was placed over a guidewire. The catheter was then placed through the sheath and the sheath removed. Final catheter positioning was confirmed and documented with a fluoroscopic spot image. The port was accessed with a needle and aspirated and flushed with heparinized saline. The needle was removed.  The venotomy and port pocket incisions were closed with subcutaneous 3-0 Monocryl and subcuticular 4-0 Vicryl. Dermabond was applied to both incisions.  COMPLICATIONS: None  FINDINGS: After catheter placement, the tip lies at the cavoatrial junction. The catheter aspirates normally and is ready for immediate use.  IMPRESSION: Placement of single lumen port a cath via right internal jugular vein. The catheter tip lies at the cavoatrial junction. A power injectable port a cath was placed and is ready for immediate use.   Electronically Signed   By: Aletta Edouard M.D.   On: 12/17/2014 17:18    ASSESSMENT: 49 y.o. Kristin Hoffman Hoffman  (1)  status post left  lumpectomy under the care of Dr. 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 2 of 23 lymph nodes were involved.  Margins were clear.  (2) status post adjuvant chemotherapy consisting of 6 q. three-week doses of docetaxel/doxorubicin/cyclophosphamide given between January 2007 and may of 2007, with last dose on 04/20/2006.  (3) status post radiation therapy to the left breast, completed 07/11/2006  (4) began on tamoxifen in early August 2007 and continued until October 2009. She status post hysterectomy with bilateral salpingo-oophorectomy on 07/15/2008, and was started on letrozole in October of 2009.  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 PET and CT of the chest showed no evidence of metastasis in January 2011.  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 Margins were clear. Patient underwent concurrent left latissimus flap reconstruction and right implant reconstruction.  (7)  status post additional chemotherapy with one cycle of carboplatin/gemcitabine given on 03/11/2010. She then received 5 q. three-week cycles of IV CMF between 03/25/2010 and 06/17/2010.  (8) started on exemestane in January 2012 and continued until late November 2013 when she was hospitalized for apparent TIA at which time her exemestane was stopped and not resumed  (9) METASTATIC DISEASE: evidence of disease recurence noted on chest CT, liver MRI and PET scan in December 2014, with suspicious lung nodules, lymphadenopathy, and 3 lesions in the right lobe of  the liver, but no evidence of bony  disease and no evidence of brain involvement by brain MRI September 2014. Biopsy of a left supraclavicular lymph node on 12/11/2013 confirmed metastatic carcinoma, estrogen receptor 45% positive, progesterone receptor 17% positive, with no HER-2 amplification.  (10) fulvestrant started 12/03/2013, discontinued 03/25/2014 with progression  (11) exemestane/ everolimus started 04/04/2014; everolimus held 04/14/2014, with skin rash and mouth soreness; resumed at 5 mg a day as of 04/29/2014, discontinued 05/09/2014 with similar symptoms  (12) continueing exemestane, adding palbociclib 05/26/2014;   (a) palbociclib dose dropped to 75 mg every other day for 21 days beginning with cycle 4  (b) exemestane and Palbociclib discontinued December 2015 with evidence of progression  (13) [facility] referral placed 04/28/2049 -- re-sent 05/23/2014  (14) [facility] one test requested 12/10/2014  (15) to start eribulin 12/25/2014, with restaging studies to follow after 3-4 cycles   PLAN:  Kristin Hoffman looks and feels well today. The labs were reviewed in detail and were entirely stable. She will proceed with day 1,cycle 2 of eribulin tomorrow as planned.   Kristin Hoffman will return next week for labs, a follow up visit, and day 8 of treatment. She understands and agrees with this plan. She knows the goal of treatment in her case is control. She has been encouraged to call with any issues that might arise before her next visit here.  Marcelino Duster, NP   01/15/2015 10:37 AM

## 2015-01-15 NOTE — Telephone Encounter (Signed)
I have adjusted appts per staff message and POF

## 2015-01-16 ENCOUNTER — Telehealth: Payer: Self-pay | Admitting: Nurse Practitioner

## 2015-01-16 ENCOUNTER — Ambulatory Visit (HOSPITAL_BASED_OUTPATIENT_CLINIC_OR_DEPARTMENT_OTHER): Payer: 59

## 2015-01-16 ENCOUNTER — Other Ambulatory Visit: Payer: Self-pay | Admitting: Oncology

## 2015-01-16 DIAGNOSIS — C50919 Malignant neoplasm of unspecified site of unspecified female breast: Secondary | ICD-10-CM

## 2015-01-16 DIAGNOSIS — C50312 Malignant neoplasm of lower-inner quadrant of left female breast: Secondary | ICD-10-CM

## 2015-01-16 DIAGNOSIS — C787 Secondary malignant neoplasm of liver and intrahepatic bile duct: Secondary | ICD-10-CM

## 2015-01-16 MED ORDER — ONDANSETRON 8 MG/NS 50 ML IVPB
INTRAVENOUS | Status: AC
  Filled 2015-01-16: qty 8

## 2015-01-16 MED ORDER — DEXAMETHASONE SODIUM PHOSPHATE 10 MG/ML IJ SOLN
10.0000 mg | Freq: Once | INTRAMUSCULAR | Status: AC
  Administered 2015-01-16: 10 mg via INTRAVENOUS

## 2015-01-16 MED ORDER — ONDANSETRON 8 MG/50ML IVPB (CHCC)
8.0000 mg | Freq: Once | INTRAVENOUS | Status: AC
  Administered 2015-01-16: 8 mg via INTRAVENOUS

## 2015-01-16 MED ORDER — DEXAMETHASONE SODIUM PHOSPHATE 10 MG/ML IJ SOLN
INTRAMUSCULAR | Status: AC
  Filled 2015-01-16: qty 1

## 2015-01-16 MED ORDER — SODIUM CHLORIDE 0.9 % IV SOLN
Freq: Once | INTRAVENOUS | Status: AC
  Administered 2015-01-16: 15:00:00 via INTRAVENOUS

## 2015-01-16 MED ORDER — SODIUM CHLORIDE 0.9 % IJ SOLN
10.0000 mL | INTRAMUSCULAR | Status: DC | PRN
  Administered 2015-01-16: 10 mL
  Filled 2015-01-16: qty 10

## 2015-01-16 MED ORDER — SODIUM CHLORIDE 0.9 % IV SOLN
1.5000 mg/m2 | Freq: Once | INTRAVENOUS | Status: AC
  Administered 2015-01-16: 3 mg via INTRAVENOUS
  Filled 2015-01-16: qty 6

## 2015-01-16 MED ORDER — HEPARIN SOD (PORK) LOCK FLUSH 100 UNIT/ML IV SOLN
500.0000 [IU] | Freq: Once | INTRAVENOUS | Status: AC | PRN
  Administered 2015-01-16: 500 [IU]
  Filled 2015-01-16: qty 5

## 2015-01-16 NOTE — Patient Instructions (Signed)
[facility] Discharge Instructions for Patients Receiving Chemotherapy  Today you received the following chemotherapy agents [place].  To help prevent nausea and vomiting after your treatment, we encourage you to take your nausea medication Compazine 10 mg, zofran 8 mg and ativan, 0.5 mg as ordered.   If you develop nausea and vomiting that is not controlled by your nausea medication, call the clinic.   BELOW ARE SYMPTOMS THAT SHOULD BE REPORTED IMMEDIATELY:  *FEVER GREATER THAN 100.5 F  *CHILLS WITH OR WITHOUT FEVER  NAUSEA AND VOMITING THAT IS NOT CONTROLLED WITH YOUR NAUSEA MEDICATION  *UNUSUAL SHORTNESS OF BREATH  *UNUSUAL BRUISING OR BLEEDING  TENDERNESS IN MOUTH AND THROAT WITH OR WITHOUT PRESENCE OF ULCERS  *URINARY PROBLEMS  *BOWEL PROBLEMS  UNUSUAL RASH Items with * indicate a potential emergency and should be followed up as soon as possible.  Feel free to call the clinic you have any questions or concerns. The clinic phone number is [phone].

## 2015-01-16 NOTE — Progress Notes (Signed)
[id number] discharged with good friend,ambulatory in no distress.

## 2015-01-16 NOTE — Telephone Encounter (Signed)
per pof to sch pt appt-reply from MW-pt to get updated copy b4 leaving-pt req am appts-late pm trmt-

## 2015-01-21 ENCOUNTER — Ambulatory Visit (HOSPITAL_BASED_OUTPATIENT_CLINIC_OR_DEPARTMENT_OTHER): Payer: 59 | Admitting: Oncology

## 2015-01-21 ENCOUNTER — Other Ambulatory Visit (HOSPITAL_BASED_OUTPATIENT_CLINIC_OR_DEPARTMENT_OTHER): Payer: 59

## 2015-01-21 ENCOUNTER — Telehealth: Payer: Self-pay | Admitting: Oncology

## 2015-01-21 VITALS — BP 120/80 | HR 101 | Temp 98.9°F | Resp 18 | Ht 63.0 in | Wt 194.4 lb

## 2015-01-21 DIAGNOSIS — C770 Secondary and unspecified malignant neoplasm of lymph nodes of head, face and neck: Secondary | ICD-10-CM

## 2015-01-21 DIAGNOSIS — C50312 Malignant neoplasm of lower-inner quadrant of left female breast: Secondary | ICD-10-CM

## 2015-01-21 DIAGNOSIS — Z8673 Personal history of transient ischemic attack (TIA), and cerebral infarction without residual deficits: Secondary | ICD-10-CM

## 2015-01-21 DIAGNOSIS — C50919 Malignant neoplasm of unspecified site of unspecified female breast: Secondary | ICD-10-CM

## 2015-01-21 DIAGNOSIS — R51 Headache: Secondary | ICD-10-CM

## 2015-01-21 DIAGNOSIS — R519 Headache, unspecified: Secondary | ICD-10-CM

## 2015-01-21 DIAGNOSIS — C787 Secondary malignant neoplasm of liver and intrahepatic bile duct: Secondary | ICD-10-CM

## 2015-01-21 LAB — CBC WITH DIFFERENTIAL/PLATELET
BASO%: 0.4 % (ref 0.0–2.0)
Basophils Absolute: 0 10*3/uL (ref 0.0–0.1)
EOS%: 0.4 % (ref 0.0–7.0)
Eosinophils Absolute: 0 10*3/uL (ref 0.0–0.5)
HCT: 41.1 % (ref 34.8–46.6)
HGB: 13.9 g/dL (ref 11.6–15.9)
LYMPH%: 27.7 % (ref 14.0–49.7)
MCH: 31.2 pg (ref 25.1–34.0)
MCHC: 33.8 g/dL (ref 31.5–36.0)
MCV: 92.4 fL (ref 79.5–101.0)
MONO#: 0.1 10*3/uL (ref 0.1–0.9)
MONO%: 1.3 % (ref 0.0–14.0)
NEUT#: 3.9 10*3/uL (ref 1.5–6.5)
NEUT%: 70.2 % (ref 38.4–76.8)
PLATELETS: 190 10*3/uL (ref 145–400)
RBC: 4.45 10*6/uL (ref 3.70–5.45)
RDW: 13.2 % (ref 11.2–14.5)
WBC: 5.6 10*3/uL (ref 3.9–10.3)
lymph#: 1.6 10*3/uL (ref 0.9–3.3)

## 2015-01-21 LAB — COMPREHENSIVE METABOLIC PANEL (CC13)
ALBUMIN: 3.5 g/dL (ref 3.5–5.0)
ALK PHOS: 64 U/L (ref 40–150)
ALT: 24 U/L (ref 0–55)
AST: 27 U/L (ref 5–34)
Anion Gap: 11 mEq/L (ref 3–11)
BUN: 20.2 mg/dL (ref 7.0–26.0)
CO2: 27 mEq/L (ref 22–29)
Calcium: 8.9 mg/dL (ref 8.4–10.4)
Chloride: 102 mEq/L (ref 98–109)
Creatinine: 1 mg/dL (ref 0.6–1.1)
EGFR: 65 mL/min/{1.73_m2} — ABNORMAL LOW (ref >90)
Glucose: 154 mg/dl — ABNORMAL HIGH (ref 70–140)
Potassium: 3.3 mEq/L — ABNORMAL LOW (ref 3.5–5.1)
Sodium: 140 mEq/L (ref 136–145)
Total Bilirubin: 0.96 mg/dL (ref 0.20–1.20)
Total Protein: 6.1 g/dL — ABNORMAL LOW (ref 6.4–8.3)

## 2015-01-21 MED ORDER — FLUCONAZOLE 100 MG PO TABS: 100.0000 mg | ORAL_TABLET | Freq: Every day | ORAL | Status: DC

## 2015-01-21 NOTE — Addendum Note (Signed)
Addended by: [name] on: 01/21/2015 05:23 PM   Modules accepted: Medications

## 2015-01-21 NOTE — Telephone Encounter (Signed)
adv pt that CS will call to sch MRI

## 2015-01-21 NOTE — Progress Notes (Signed)
ID: Kristin Hoffman OB: 1966-06-02  MR#: [id number]  WPY#:[id number]  PCP: Kristin Leach, MD GYN:  Kristin Hoffman SU: Kristin Hoffman OTHER MD: Kristin Hoffman  CHIEF COMPLAINT:  Stage IV breast cancer CURRENT TREATMENT: eribulin  BREAST CANCER HISTORY: This patient was previously followed by Kristin Hoffman, and was transferred to Kristin Hoffman service as of 08/20/2013.  At the age of 49, the patient had a screening mammogram as a baseline. She had recently given birth and was on birth control pills at that time. The mammogram in November 2006 showed an area of architectural distortion in the left lower inner quadrant. Subsequently an ultrasound was obtained of the left breast showing a 2.0 x 1.5 x 1.4 cm mass, at the 8:00 position, 4 cm from the nipple. A core biopsy performed on 10/21/2005 showed an invasive mammary carcinoma, ER +70%, PR +41%, HER-2/neu negative, with proliferation marker of 38%. [phone])  Breast MRI on 11/08/2005 confirmed a 2.5 cm spiculated mass in the left lower inner quadrant with 3 small satellite nodules adjacent to the primary mass: 3.5 cm posterior medial to the primary mass, measuring 9 mm; 1.5 cm anterior medial to the primary mass measuring 5 mm; and 2 cm medial to the primary mass measuring 5 mm. No axillary adenopathy was noted. No suspicious masses or enhancement are noted in the right breast.  Kristin Hoffman underwentt left lumpectomy under the care of Kristin Hoffman on 11/18/2005 for a 2.5 cm grade 3 invasive ductal carcinoma, ER +70%, PR +41%, HER-2/neu negative, with proliferation marker of 38%. 2 of 23 lymph nodes were involved.  Margins were clear.  She received adjuvant chemotherapy consisting of 6 q. three-week doses of docetaxel/doxorubicin/cyclophosphamide given between January 2007 and may of 2007, with last dose on 04/20/2006. She underwent radiation therapy completed 07/11/2006, after which she began on tamoxifen in early August 2007.  She underwent hysterectomy  with bilateral salpingo-oophorectomy on 07/15/2008, and was started on letrozole in October of 2009.  A mammogram 11/10/2009 showed microcalcifications in the left breast, and a subsequent biopsy on 11/18/2009 confirmed invasive mammary carcinoma in the left breast. PET and CT of the chest showed no evidence of metastasis in January 2011.  She underwent bilateral mastectomies 01/25/2010, with the right breast showing no evidence of malignancy; in the left breast showing a 1.0 cm grade 2 invasive ductal carcinoma with high-grade DCIS. Tumor was ER +22%, PR negative, HER-2/neu negative, with proliferation marker of 13%. Margins were clear. Patient underwent concurrent left latissimus flap reconstruction and right implant reconstruction.  She received additional chemotherapy with one cycle of carboplatin/gemcitabine given on 03/11/2010. She then received 5 q. three-week cycles of IV CMF between 03/25/2010 and 06/17/2010.  She was started on exemestane in January 2012 and continued until late November 2013 when she was hospitalized for apparent TIA.   Her subsequent history is as detailed below  INTERVAL HISTORY: Kristin Hoffman returns today for followup of her metastatic breast cancer. Today is day 21, cycle 1 of eribulin. Day 8 of treatment was held because of inclement weather. Overall she tolerated the first treatment well. She had some mouth/tongue pain with no true mouth sores, but this was cleared with the use of magic mouthwash. She was constipated for 3 days, but this resolved on its own. She has been using reglan and omeprazole for her indigestion, but this theorizes that the vitamin C supplements she has been taking could be the reason this discomfort has been worse lately. She has occasional hot flashes and  mild vaginal dryness. Her appetite and energy levels are fair. She denies peripheral neuropathy symptoms.  REVIEW OF SYSTEMS: A detailed review of systems is otherwise entirely negative.   PAST  MEDICAL HISTORY: Past Medical History  Diagnosis Date  . Breast cancer   . Depression   . Reflux   . Cancer   . Hx-TIA (transient ischemic attack) 11/22/2013  . Chronic headaches 11/22/2013  . Anxiety 11/22/2013  . Breast cancer metastasized to multiple sites 12/24/2013    PAST SURGICAL HISTORY: Past Surgical History  Procedure Laterality Date  . Mastectomy w/ nodes partial    . Mastectomy    . Breast reconstruction    . Portacath placement      x 2  . Portacath removal      x 2  . Abdominal hysterectomy    . Tee without cardioversion  11/12/2012    Procedure: TRANSESOPHAGEAL ECHOCARDIOGRAM (TEE);  Surgeon: Kristin Furbish, MD;  Location: [facility] ENDOSCOPY;  Service: Cardiovascular;  Laterality: N/A;  This TEE may be Dr. Marlou Hoffman or a [facility] Dr    FAMILY HISTORY Both parents are alive and well. Patient has one sister who is 4 years younger and is in good health. No other history of breast or ovarian cancer in the family. No family history on file.  GYNECOLOGIC HISTORY:   (Updated January 2015) G2P2, menarche at age 2 with irregular menses. On birth control pills in the past. On Clomid to induce ovulation with first pregnancy. Also had preeclampsia with first pregnancy. Status post hysterectomy and bilateral salpingo-oophorectomy in August 2009.  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 She is originally from [place]. She's been married to Kristin Hoffman, for 13 years. He works as an Pharmacist, hospital at [employer].   ADVANCED DIRECTIVES:    HEALTH MAINTENANCE:  (Updated 11/22/2013) History  Substance Use Topics  . Smoking status: Never Smoker   . Smokeless tobacco: Never Used  . Alcohol Use: Yes     Comment: rarely     Colonoscopy:  Never  PAP: S/P LAVH/BSO in August 2009  Bone density: Never  Lipid panel: Not on file   Not on File  Current Outpatient Prescriptions  Medication Sig  Dispense Refill  . acidophilus (RISAQUAD) CAPS capsule Take 1 capsule by mouth daily.    Marland Kitchen aspirin 81 MG tablet Take 81 mg by mouth daily.    . cetirizine (ZYRTEC) 10 MG tablet Take 10 mg by mouth daily. Pt takes this med at night    . Cholecalciferol 2000 UNITS CHEW Chew 1 tablet by mouth daily.    Marland Kitchen dexamethasone (DECADRON) 4 MG tablet Take 2 tablets (8 mg total) by mouth 2 (two) times daily with a meal. Start the day after chemotherapy for 2 days. Take with food. 30 tablet 1  . escitalopram (LEXAPRO) 20 MG tablet Take 20 mg by mouth daily with breakfast.     . fluconazole (DIFLUCAN) 100 MG tablet Take 1 tablet (100 mg total) by mouth daily. 20 tablet 12  . lidocaine-prilocaine (EMLA) cream Apply to affected area once 30 g 3  . LORazepam (ATIVAN) 0.5 MG tablet Take 1 tablet (0.5 mg total) by mouth at bedtime as needed (Nausea or vomiting). 30 tablet 0  . Melatonin 10 MG TABS Take 1 tablet by mouth at bedtime.    . metoCLOPramide (REGLAN) 5 MG tablet Take 5 mg by mouth 4 (four)  times daily.    . montelukast (SINGULAIR) 10 MG tablet Take 10 mg by mouth daily. Pt takes this med in the morning.    . Multiple Vitamin (MULTIVITAMIN) tablet Take 1 tablet by mouth daily.    Marland Kitchen omeprazole (PRILOSEC) 40 MG capsule Take 40 mg by mouth daily.     . ondansetron (ZOFRAN) 8 MG tablet Take 1 tablet (8 mg total) by mouth 2 (two) times daily. Start the day after chemo for 2 days. Then take as needed for nausea or vomiting. 30 tablet 1  . OVER THE COUNTER MEDICATION Take 2 capsules by mouth 3 (three) times daily.    Marland Kitchen OVER THE COUNTER MEDICATION Take 2 drops by mouth 2 (two) times daily.    Marland Kitchen OVER THE COUNTER MEDICATION Take 1 drop by mouth daily as needed (FOR ACID REFLUX).    Marland Kitchen prochlorperazine (COMPAZINE) 10 MG tablet Take 1 tablet (10 mg total) by mouth every 6 (six) hours as needed (Nausea or vomiting). (Patient not taking: Reported on 01/15/2015) 30 tablet 1   No current facility-administered medications for  this visit.    OBJECTIVE: Young white woman in no acute distress Filed Vitals:   01/21/15 1620  BP: 120/80  Pulse: 101  Temp: 98.9 F (37.2 C)  Resp: 18  Body mass index is 34.44 kg/(m^2).  ECOG: 1 Filed Weights   01/21/15 1620  Weight: 194 lb 6.4 oz (88.179 kg)   Skin: warm, dry  HEENT: sclerae anicteric, conjunctivae pink, oropharynx clear. No thrush or mucositis.  Lymph Nodes: No cervical or supraclavicular lymphadenopathy  Lungs: clear to auscultation bilaterally, no rales, wheezes, or rhonci  Heart: regular rate and rhythm  Abdomen: round, soft, non tender, positive bowel sounds  Musculoskeletal: No focal spinal tenderness, no peripheral edema  Neuro: non focal, well oriented, positive affect  Breasts: deferred  LAB RESULTS:   Lab Results  Component Value Date   WBC 5.6 01/21/2015   NEUTROABS 3.9 01/21/2015   HGB 13.9 01/21/2015   HCT 41.1 01/21/2015   MCV 92.4 01/21/2015   PLT 190 01/21/2015      Chemistry      Component Value Date/Time   NA 140 01/21/2015 1616   NA 142 11/12/2012 0735   K 3.3* 01/21/2015 1616   K 3.8 11/12/2012 0735   CL 104 11/14/2012 1407   CL 104 11/12/2012 0735   CO2 27 01/21/2015 1616   CO2 30 11/12/2012 0735   BUN 20.2 01/21/2015 1616   BUN 13 11/12/2012 0735   CREATININE 1.0 01/21/2015 1616   CREATININE 0.97 11/12/2012 0735      Component Value Date/Time   CALCIUM 8.9 01/21/2015 1616   CALCIUM 9.1 11/12/2012 0735   ALKPHOS 64 01/21/2015 1616   ALKPHOS 69 11/12/2012 0735   AST 27 01/21/2015 1616   AST 16 11/12/2012 0735   ALT 24 01/21/2015 1616   ALT 13 11/12/2012 0735   BILITOT 0.96 01/21/2015 1616   BILITOT 0.9 11/12/2012 0735      STUDIES: Dg Wrist Complete Right  01/08/2015   CLINICAL DATA:  Acute right wrist pain after falling last night. Initial encounter.  EXAM: RIGHT WRIST - COMPLETE 3+ VIEW  COMPARISON:  None.  FINDINGS: There is no evidence of fracture or dislocation. There is no evidence of arthropathy or  other focal bone abnormality. Soft tissues are unremarkable.  IMPRESSION: Normal right wrist.   Electronically Signed   By: Sabino Dick M.D.   On: 01/08/2015 12:00  ASSESSMENT: 49 y.o. 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 2 of 23 lymph nodes were involved.  Margins were clear.  (2) status post adjuvant chemotherapy consisting of 6 q. three-week doses of docetaxel/doxorubicin/cyclophosphamide given between January 2007 and may of 2007, with last dose on 04/20/2006.  (3) status post radiation therapy to the left breast, completed 07/11/2006  (4) began on tamoxifen in early August 2007 and continued until October 2009. She status post hysterectomy with bilateral salpingo-oophorectomy on 07/15/2008, and was started on letrozole in October of 2009.  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 PET and CT of the chest showed no evidence of metastasis in January 2011.  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 Margins were clear. Patient underwent concurrent left latissimus flap reconstruction and right implant reconstruction.  (7)  status post additional chemotherapy with one cycle of carboplatin/gemcitabine given on 03/11/2010. She then received 5 q. three-week cycles of IV CMF between 03/25/2010 and 06/17/2010.  (8) started on exemestane in January 2012 and continued until late November 2013 when she was hospitalized for apparent TIA at which time her exemestane was stopped and not  resumed  (9) METASTATIC DISEASE: evidence of disease recurence noted on chest CT, liver MRI and PET scan in December 2014, with suspicious lung nodules, lymphadenopathy, and 3 lesions in the right lobe of the liver, but no evidence of bony disease and no evidence of brain involvement by brain MRI September 2014. Biopsy of a left supraclavicular lymph node on 12/11/2013 confirmed metastatic carcinoma, estrogen receptor 45% positive, progesterone receptor 17% positive, with no HER-2 amplification.  (10) fulvestrant started 12/03/2013, discontinued 03/25/2014 with progression  (11) exemestane/ everolimus started 04/04/2014; everolimus held 04/14/2014, with skin rash and mouth soreness; resumed at 5 mg a day as of 04/29/2014, discontinued 05/09/2014 with similar symptoms  (12) continueing exemestane, adding palbociclib 05/26/2014;   (a) palbociclib dose dropped to 75 mg every other day for 21 days beginning with cycle 4  (b) exemestane and Palbociclib discontinued December 2015 with evidence of progression  (13) [facility] referral placed 04/28/2049 -- re-sent 05/23/2014  (14) [facility] one test requested 12/10/2014  (15) to start eribulin 12/25/2014, with restaging studies to follow after 3-4 cycles   PLAN:  Kristin Hoffman looks and feels well today. The labs were reviewed in detail and were entirely stable. She will proceed with day 1,cycle 2 of eribulin tomorrow as planned.   Kristin Hoffman will return next week for labs, a follow up visit, and day 8 of treatment. She understands and agrees with this plan. She knows the goal of treatment in her case is control. She has been encouraged to call with any issues that might arise before her next visit here.  Chauncey Cruel, MD   01/21/2015 5:08 PM

## 2015-01-21 NOTE — Progress Notes (Signed)
ID: Kristin Hoffman OB: [birth year]  MR#: [id number]  SWH#:[id number]  PCP: Drake Leach, MD GYN:  [name] SU: [name] OTHER MD: [name]  CHIEF COMPLAINT:  Stage IV breast cancer CURRENT TREATMENT: eribulin  BREAST CANCER HISTORY: This patient was previously followed by [name], and was transferred to [name] service as of 08/20/2013.  At the age of 49, the patient had a screening mammogram as a baseline. She had recently given birth and was on birth control pills at that time. The mammogram in November 2006 showed an area of architectural distortion in the left lower inner quadrant. Subsequently an ultrasound was obtained of the left breast showing a 2.0 x 1.5 x 1.4 cm mass, at the 8:00 position, 4 cm from the nipple. A core biopsy performed on 10/21/2005 showed an invasive mammary carcinoma, ER +70%, PR +41%, HER-2/neu negative, with proliferation marker of 38%. [phone]  Breast MRI on 11/08/2005 confirmed a 2.5 cm spiculated mass in the left lower inner quadrant with 3 small satellite nodules adjacent to the primary mass: 3.5 cm posterior medial to the primary mass, measuring 9 mm; 1.5 cm anterior medial to the primary mass measuring 5 mm; and 2 cm medial to the primary mass measuring 5 mm. No axillary adenopathy was noted. No suspicious masses or enhancement are noted in the right breast.  [name] underwentt left lumpectomy under the care of Dr. 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 2 of 23 lymph nodes were involved.  Margins were clear.  She received adjuvant chemotherapy consisting of 6 q. three-week doses of docetaxel/doxorubicin/cyclophosphamide given between January 2007 and may of 2007, with last dose on 04/20/2006. She underwent radiation therapy completed 07/11/2006, after which she began on tamoxifen in early August 2007.  She underwent hysterectomy  with bilateral salpingo-oophorectomy on 07/15/2008, and was started on letrozole in October of 2009.  A mammogram 11/10/2009 showed microcalcifications in the left breast, and a subsequent biopsy on 11/18/2009 confirmed invasive mammary carcinoma in the left breast. PET and CT of the chest showed no evidence of metastasis in January 2011.  She underwent bilateral mastectomies 01/25/2010, with the right breast showing no evidence of malignancy; in the left breast showing a 1.0 cm grade 2 invasive ductal carcinoma with high-grade DCIS. Tumor was ER +22%, PR negative, HER-2/neu negative, with proliferation marker of 13%. Margins were clear. Patient underwent concurrent left latissimus flap reconstruction and right implant reconstruction.  She received additional chemotherapy with one cycle of carboplatin/gemcitabine given on 03/11/2010. She then received 5 q. three-week cycles of IV CMF between 03/25/2010 and 06/17/2010.  She was started on exemestane in January 2012 and continued until late November 2013 when she was hospitalized for apparent TIA.   Her subsequent history is as detailed below  INTERVAL HISTORY: [name] returns today for followup of her metastatic breast cancer accompanied by her father. (Her mother is upfront with [name]'s 2 children Today is day 6, cycle 2 of eribulin, which she receives on days 1 and 8 of each 21 day cycle.  REVIEW OF SYSTEMS: She is generally tolerating the treatment well. She lost her hair about 2 weeks after the first dose. The day 8 treatment that first cycle was held because of bad weather. This time she did well with the first day treatment, with no significant problems with nausea, and so far no peripheral neuropathy. She had been minimally constipated  the first time but this time she is eating more fiber and that is helping. What she has noted however is severe fatigue. This makes her so tired she just wants to weep because she can't do all the things that she needs  to do. The problem started on day 4, which is the first day off steroids. She also feels a little bit on the "foggy" side. She has a little bit of difficulty swallowing and some taste alteration. A detailed review of systems today was otherwise entirely noncontributory  PAST MEDICAL HISTORY: Past Medical History  Diagnosis Date  . Breast cancer   . Depression   . Reflux   . Cancer   . Hx-TIA (transient ischemic attack) 11/22/2013  . Chronic headaches 11/22/2013  . Anxiety 11/22/2013  . Breast cancer metastasized to multiple sites 12/24/2013    PAST SURGICAL HISTORY: Past Surgical History  Procedure Laterality Date  . Mastectomy w/ nodes partial    . Mastectomy    . Breast reconstruction    . Portacath placement      x 2  . Portacath removal      x 2  . Abdominal hysterectomy    . Tee without cardioversion  11/12/2012    Procedure: TRANSESOPHAGEAL ECHOCARDIOGRAM (TEE);  Surgeon: Candee Furbish, MD;  Location: [facility] ENDOSCOPY;  Service: Cardiovascular;  Laterality: N/A;  This TEE may be Dr. Marlou Porch or a LHC Dr    FAMILY HISTORY Both parents are alive and well. Patient has one sister who is 29 years younger and is in good health. No other history of breast or ovarian cancer in the family. No family history on file.  GYNECOLOGIC HISTORY:   (Updated January 2015) G2P2, menarche at age 49 with irregular menses. On birth control pills in the past. On Clomid to induce ovulation with first pregnancy. Also had preeclampsia with first pregnancy. Status post hysterectomy and bilateral salpingo-oophorectomy in August 2009.  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 She is originally from [place]. She's been married to [place], for 13 years. He works as an Pharmacist, hospital at [employer].   ADVANCED DIRECTIVES: Not in place   HEALTH MAINTENANCE:  (Updated 11/22/2013) History  Substance Use Topics  .  Smoking status: Never Smoker   . Smokeless tobacco: Never Used  . Alcohol Use: Yes     Comment: rarely     Colonoscopy:  Never  PAP: S/P LAVH/BSO in August 2009  Bone density: Never  Lipid panel: Not on file   Not on File  Current Outpatient Prescriptions  Medication Sig Dispense Refill  . acidophilus (RISAQUAD) CAPS capsule Take 1 capsule by mouth daily.    Marland Kitchen aspirin 81 MG tablet Take 81 mg by mouth daily.    . cetirizine (ZYRTEC) 10 MG tablet Take 10 mg by mouth daily. Pt takes this med at night    . Cholecalciferol 2000 UNITS CHEW Chew 1 tablet by mouth daily.    Marland Kitchen dexamethasone (DECADRON) 4 MG tablet Take 2 tablets (8 mg total) by mouth 2 (two) times daily with a meal. Start the day after chemotherapy for 2 days. Take with food. 30 tablet 1  . escitalopram (LEXAPRO) 20 MG tablet Take 20 mg by mouth daily with breakfast.     . fluconazole (DIFLUCAN) 100 MG tablet Take 1 tablet (100 mg total) by mouth daily. 20 tablet 12  . lidocaine-prilocaine (EMLA)  cream Apply to affected area once 30 g 3  . LORazepam (ATIVAN) 0.5 MG tablet Take 1 tablet (0.5 mg total) by mouth at bedtime as needed (Nausea or vomiting). 30 tablet 0  . Melatonin 10 MG TABS Take 1 tablet by mouth at bedtime.    . metoCLOPramide (REGLAN) 5 MG tablet Take 5 mg by mouth 4 (four) times daily.    . montelukast (SINGULAIR) 10 MG tablet Take 10 mg by mouth daily. Pt takes this med in the morning.    . Multiple Vitamin (MULTIVITAMIN) tablet Take 1 tablet by mouth daily.    Marland Kitchen omeprazole (PRILOSEC) 40 MG capsule Take 40 mg by mouth daily.     . ondansetron (ZOFRAN) 8 MG tablet Take 1 tablet (8 mg total) by mouth 2 (two) times daily. Start the day after chemo for 2 days. Then take as needed for nausea or vomiting. 30 tablet 1  . OVER THE COUNTER MEDICATION Take 2 capsules by mouth 3 (three) times daily.    Marland Kitchen OVER THE COUNTER MEDICATION Take 2 drops by mouth 2 (two) times daily.    Marland Kitchen OVER THE COUNTER MEDICATION Take 1 drop by  mouth daily as needed (FOR ACID REFLUX).    Marland Kitchen prochlorperazine (COMPAZINE) 10 MG tablet Take 1 tablet (10 mg total) by mouth every 6 (six) hours as needed (Nausea or vomiting). (Patient not taking: Reported on 01/15/2015) 30 tablet 1   No current facility-administered medications for this visit.    OBJECTIVE: Young white woman who appears stated age 28 Vitals:   01/21/15 1620  BP: 120/80  Pulse: 101  Temp: 98.9 F (37.2 C)  Resp: 18  Body mass index is 34.44 kg/(m^2).  ECOG: 1 Filed Weights   01/21/15 1620  Weight: 194 lb 6.4 oz (88.179 kg)   Sclerae unicteric, pupils equal and reactive Oropharynx shows mild to moderate thrush, no focal lesions No cervical or supraclavicular adenopathy Lungs no rales or rhonchi Heart regular rate and rhythm Abd soft, nontender, positive bowel sounds MSK no focal spinal tenderness, no upper extremity lymphedema Neuro: nonfocal, well oriented, appropriate affect Breasts: Deferred   LAB RESULTS:   Lab Results  Component Value Date   WBC 5.6 01/21/2015   NEUTROABS 3.9 01/21/2015   HGB 13.9 01/21/2015   HCT 41.1 01/21/2015   MCV 92.4 01/21/2015   PLT 190 01/21/2015      Chemistry      Component Value Date/Time   NA 140 01/21/2015 1616   NA 142 11/12/2012 0735   K 3.3* 01/21/2015 1616   K 3.8 11/12/2012 0735   CL 104 11/14/2012 1407   CL 104 11/12/2012 0735   CO2 27 01/21/2015 1616   CO2 30 11/12/2012 0735   BUN 20.2 01/21/2015 1616   BUN 13 11/12/2012 0735   CREATININE 1.0 01/21/2015 1616   CREATININE 0.97 11/12/2012 0735      Component Value Date/Time   CALCIUM 8.9 01/21/2015 1616   CALCIUM 9.1 11/12/2012 0735   ALKPHOS 64 01/21/2015 1616   ALKPHOS 69 11/12/2012 0735   AST 27 01/21/2015 1616   AST 16 11/12/2012 0735   ALT 24 01/21/2015 1616   ALT 13 11/12/2012 0735   BILITOT 0.96 01/21/2015 1616   BILITOT 0.9 11/12/2012 0735      STUDIES: Dg Wrist Complete Right  01/08/2015   CLINICAL DATA:  Acute right wrist  pain after falling last night. Initial encounter.  EXAM: RIGHT WRIST - COMPLETE 3+ VIEW  COMPARISON:  None.  FINDINGS: There is no evidence of fracture or dislocation. There is no evidence of arthropathy or other focal bone abnormality. Soft tissues are unremarkable.  IMPRESSION: Normal right wrist.   Electronically Signed   By: Sabino Dick M.D.   On: 01/08/2015 12:00    ASSESSMENT: 49 y.o. [place] woman  (1)  status post left lumpectomy under the care of Dr. 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 2 of 23 lymph nodes were involved.  Margins were clear.  (2) status post adjuvant chemotherapy consisting of 6 q. three-week doses of docetaxel/doxorubicin/cyclophosphamide given between January 2007 and may of 2007, with last dose on 04/20/2006.  (3) status post radiation therapy to the left breast, completed 07/11/2006  (4) began on tamoxifen in early August 2007 and continued until October 2009. She status post hysterectomy with bilateral salpingo-oophorectomy on 07/15/2008, and was started on letrozole in October of 2009.  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 PET and CT of the chest showed no evidence of metastasis in January 2011.  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 Margins were clear. Patient underwent concurrent left latissimus flap reconstruction and right implant reconstruction.  (7)  status post additional chemotherapy with one cycle of carboplatin/gemcitabine given on 03/11/2010. She then received 5 q. three-week cycles of IV CMF between  03/25/2010 and 06/17/2010.  (8) started on exemestane in January 2012 and continued until late November 2013 when she was hospitalized for apparent TIA at which time her exemestane was stopped and not resumed  (9) METASTATIC DISEASE: evidence of disease recurence noted on chest CT, liver MRI and PET scan in December 2014, with suspicious lung nodules, lymphadenopathy, and 3 lesions in the right lobe of the liver, but no evidence of bony disease and no evidence of brain involvement by brain MRI September 2014. Biopsy of a left supraclavicular lymph node on 12/11/2013 confirmed metastatic carcinoma, estrogen receptor 45% positive, progesterone receptor 17% positive, with no HER-2 amplification.  (10) fulvestrant started 12/03/2013, discontinued 03/25/2014 with progression  (11) exemestane/ everolimus started 04/04/2014; everolimus held 04/14/2014, with skin rash and mouth soreness; resumed at 5 mg a day as of 04/29/2014, discontinued 05/09/2014 with similar symptoms  (12) continueing exemestane, adding palbociclib 05/26/2014;   (a) palbociclib dose dropped to 75 mg every other day for 21 days beginning with cycle 4  (b) exemestane and Palbociclib discontinued December 2015 with evidence of progression  (13) [facility] referral placed 04/28/2049 -- shows PiK3CA and TP53 mutations mutation and a  (14) [facility] test requested 12/10/2014: confirms mutations in [facility] and TP53, and adds additional mutations with potential for clinical trials.  (15) started eribulin 12/26/2014, given days 1 and 8 of each 21 day cycle, with restaging studies to follow after 3 cycles  (a) day 8 cycle 1 omitted because of bad weather   PLAN:  Jentri is tolerating the eribulin generally well. I think we can do a little bit better. Her severe fatigue is likely due to the abrupt steroid withdrawal, so beginning on day 4 she will start dexamethasone 2 mg at breakfast for the next 3-4 days. All fully this brief "  extension"  of her antinausea steroids will allow her to function more normally is that of being so fatigued that she gets frustrated and depressed.  She does have thrush, which she was not aware of. I showed her how to diagnose it by just looking in the mirror and I have written for Diflucan for her to take daily on an as-needed basis.  She is going to receive the day 8 cycle 2 of eribulin 2 days from now, and then I set her up for a third cycle beginning the last Friday in February. After that she will have an MRI March 16 and see me March 18. If there is at least stable disease the plan will be to continue the eribulin on the 18th.  The question is what to do if there is disease progression. I gave her a copy of her [facility] report and also the [facility] report. She would be interested in considering sums research protocols and I'm going to place a referral to [facility] for they can start discussing this. That way if we see disease progression in the mid-March liver MRI she will be ready to proceed, assuming that she qualifies for protocol and one is open.  Vaanya has a good understanding of the overall plan. She agrees with it. She knows the goal of treatment in her case is control. She will call with any problems that may develop before her next visit here.  Chauncey Cruel, MD   01/21/2015 4:53 PM

## 2015-01-21 NOTE — Telephone Encounter (Signed)
per pof to sch pt appt-sent MW email to sch pt trmt-pt has [place] and will look for updated sch

## 2015-01-22 ENCOUNTER — Telehealth: Payer: Self-pay | Admitting: Oncology

## 2015-01-23 ENCOUNTER — Ambulatory Visit (HOSPITAL_BASED_OUTPATIENT_CLINIC_OR_DEPARTMENT_OTHER): Payer: 59

## 2015-01-23 DIAGNOSIS — C50919 Malignant neoplasm of unspecified site of unspecified female breast: Secondary | ICD-10-CM

## 2015-01-23 DIAGNOSIS — Z5111 Encounter for antineoplastic chemotherapy: Secondary | ICD-10-CM

## 2015-01-23 DIAGNOSIS — C787 Secondary malignant neoplasm of liver and intrahepatic bile duct: Secondary | ICD-10-CM

## 2015-01-23 DIAGNOSIS — C50312 Malignant neoplasm of lower-inner quadrant of left female breast: Secondary | ICD-10-CM

## 2015-01-23 MED ORDER — SODIUM CHLORIDE 0.9 % IV SOLN
1.5000 mg/m2 | Freq: Once | INTRAVENOUS | Status: AC
  Administered 2015-01-23: 3 mg via INTRAVENOUS
  Filled 2015-01-23: qty 6

## 2015-01-23 MED ORDER — ONDANSETRON 8 MG/NS 50 ML IVPB
INTRAVENOUS | Status: AC
  Filled 2015-01-23: qty 8

## 2015-01-23 MED ORDER — HEPARIN SOD (PORK) LOCK FLUSH 100 UNIT/ML IV SOLN
500.0000 [IU] | Freq: Once | INTRAVENOUS | Status: AC | PRN
  Administered 2015-01-23: 500 [IU]
  Filled 2015-01-23: qty 5

## 2015-01-23 MED ORDER — SODIUM CHLORIDE 0.9 % IV SOLN
Freq: Once | INTRAVENOUS | Status: AC
  Administered 2015-01-23: 16:00:00 via INTRAVENOUS

## 2015-01-23 MED ORDER — ONDANSETRON 8 MG/50ML IVPB (CHCC)
8.0000 mg | Freq: Once | INTRAVENOUS | Status: AC
  Administered 2015-01-23: 8 mg via INTRAVENOUS

## 2015-01-23 MED ORDER — SODIUM CHLORIDE 0.9 % IJ SOLN
10.0000 mL | INTRAMUSCULAR | Status: DC | PRN
  Administered 2015-01-23: 10 mL
  Filled 2015-01-23: qty 10

## 2015-01-23 MED ORDER — DEXAMETHASONE SODIUM PHOSPHATE 10 MG/ML IJ SOLN
10.0000 mg | Freq: Once | INTRAMUSCULAR | Status: AC
  Administered 2015-01-23: 10 mg via INTRAVENOUS

## 2015-01-23 MED ORDER — DEXAMETHASONE SODIUM PHOSPHATE 10 MG/ML IJ SOLN
INTRAMUSCULAR | Status: AC
  Filled 2015-01-23: qty 1

## 2015-01-23 NOTE — Patient Instructions (Signed)
[facility] Discharge Instructions for Patients Receiving Chemotherapy  Today you received the following chemotherapy agents [place].  To help prevent nausea and vomiting after your treatment, we encourage you to take your nausea medication.   If you develop nausea and vomiting that is not controlled by your nausea medication, call the clinic.   BELOW ARE SYMPTOMS THAT SHOULD BE REPORTED IMMEDIATELY:  *FEVER GREATER THAN 100.5 F  *CHILLS WITH OR WITHOUT FEVER  NAUSEA AND VOMITING THAT IS NOT CONTROLLED WITH YOUR NAUSEA MEDICATION  *UNUSUAL SHORTNESS OF BREATH  *UNUSUAL BRUISING OR BLEEDING  TENDERNESS IN MOUTH AND THROAT WITH OR WITHOUT PRESENCE OF ULCERS  *URINARY PROBLEMS  *BOWEL PROBLEMS  UNUSUAL RASH Items with * indicate a potential emergency and should be followed up as soon as possible.  Feel free to call the clinic you have any questions or concerns. The clinic phone number is [phone].    

## 2015-01-25 ENCOUNTER — Other Ambulatory Visit: Payer: Self-pay | Admitting: Oncology

## 2015-01-25 DIAGNOSIS — Z853 Personal history of malignant neoplasm of breast: Secondary | ICD-10-CM

## 2015-01-30 NOTE — Telephone Encounter (Signed)
None

## 2015-02-06 ENCOUNTER — Ambulatory Visit: Payer: 59

## 2015-02-06 ENCOUNTER — Ambulatory Visit (HOSPITAL_BASED_OUTPATIENT_CLINIC_OR_DEPARTMENT_OTHER): Payer: 59 | Admitting: Nurse Practitioner

## 2015-02-06 ENCOUNTER — Encounter: Payer: Self-pay | Admitting: Nurse Practitioner

## 2015-02-06 ENCOUNTER — Ambulatory Visit: Payer: 59 | Admitting: Nurse Practitioner

## 2015-02-06 ENCOUNTER — Other Ambulatory Visit: Payer: 59

## 2015-02-06 ENCOUNTER — Other Ambulatory Visit (HOSPITAL_BASED_OUTPATIENT_CLINIC_OR_DEPARTMENT_OTHER): Payer: 59

## 2015-02-06 VITALS — BP 133/76 | HR 92 | Temp 98.6°F | Resp 18 | Ht 63.0 in | Wt 196.3 lb

## 2015-02-06 DIAGNOSIS — C50919 Malignant neoplasm of unspecified site of unspecified female breast: Secondary | ICD-10-CM

## 2015-02-06 DIAGNOSIS — C50312 Malignant neoplasm of lower-inner quadrant of left female breast: Secondary | ICD-10-CM

## 2015-02-06 DIAGNOSIS — G620 Drug-induced polyneuropathy: Secondary | ICD-10-CM

## 2015-02-06 DIAGNOSIS — G8929 Other chronic pain: Secondary | ICD-10-CM

## 2015-02-06 DIAGNOSIS — C787 Secondary malignant neoplasm of liver and intrahepatic bile duct: Secondary | ICD-10-CM

## 2015-02-06 DIAGNOSIS — Z8673 Personal history of transient ischemic attack (TIA), and cerebral infarction without residual deficits: Secondary | ICD-10-CM

## 2015-02-06 DIAGNOSIS — M62830 Muscle spasm of back: Secondary | ICD-10-CM

## 2015-02-06 DIAGNOSIS — R51 Headache: Secondary | ICD-10-CM

## 2015-02-06 DIAGNOSIS — T451X5A Adverse effect of antineoplastic and immunosuppressive drugs, initial encounter: Secondary | ICD-10-CM

## 2015-02-06 LAB — CBC WITH DIFFERENTIAL/PLATELET
BASO%: 1.2 % (ref 0.0–2.0)
BASOS ABS: 0.1 10*3/uL (ref 0.0–0.1)
EOS%: 0 % (ref 0.0–7.0)
Eosinophils Absolute: 0 10*3/uL (ref 0.0–0.5)
HCT: 35.5 % (ref 34.8–46.6)
HGB: 11.1 g/dL — ABNORMAL LOW (ref 11.6–15.9)
LYMPH#: 1 10*3/uL (ref 0.9–3.3)
LYMPH%: 18.9 % (ref 14.0–49.7)
MCH: 29.4 pg (ref 25.1–34.0)
MCHC: 31.3 g/dL — ABNORMAL LOW (ref 31.5–36.0)
MCV: 93.7 fL (ref 79.5–101.0)
MONO#: 0.4 10*3/uL (ref 0.1–0.9)
MONO%: 7.9 % (ref 0.0–14.0)
NEUT#: 3.8 10*3/uL (ref 1.5–6.5)
NEUT%: 72 % (ref 38.4–76.8)
Platelets: 276 10*3/uL (ref 145–400)
RBC: 3.79 10*6/uL (ref 3.70–5.45)
RDW: 15.5 % — AB (ref 11.2–14.5)
WBC: 5.3 10*3/uL (ref 3.9–10.3)

## 2015-02-06 MED ORDER — CYCLOBENZAPRINE HCL 5 MG PO TABS: 5.0000 mg | ORAL_TABLET | Freq: Three times a day (TID) | ORAL | Status: DC | PRN

## 2015-02-06 NOTE — Progress Notes (Signed)
ID: Kristin Hoffman OB: 02/08/1966  MR#: [id number]  YYQ#:[id number]  PCP: Drake Leach, MD GYN:  [name] SU: [name] OTHER MD: [name]  CHIEF COMPLAINT:  Stage IV breast cancer CURRENT TREATMENT: eribulin  BREAST CANCER HISTORY: This patient was previously followed by [name], and was transferred to [name] service as of 08/20/2013.  At the age of 49, the patient had a screening mammogram as a baseline. She had recently given birth and was on birth control pills at that time. The mammogram in November 2006 showed an area of architectural distortion in the left lower inner quadrant. Subsequently an ultrasound was obtained of the left breast showing a 2.0 x 1.5 x 1.4 cm mass, at the 8:00 position, 4 cm from the nipple. A core biopsy performed on 10/21/2005 showed an invasive mammary carcinoma, ER +70%, PR +41%, HER-2/neu negative, with proliferation marker of 38%. [phone])  Breast MRI on 11/08/2005 confirmed a 2.5 cm spiculated mass in the left lower inner quadrant with 3 small satellite nodules adjacent to the primary mass: 3.5 cm posterior medial to the primary mass, measuring 9 mm; 1.5 cm anterior medial to the primary mass measuring 5 mm; and 2 cm medial to the primary mass measuring 5 mm. No axillary adenopathy was noted. No suspicious masses or enhancement are noted in the right breast.  Carmelle underwentt left lumpectomy under the care of Dr. 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 2 of 23 lymph nodes were involved.  Margins were clear.  She received adjuvant chemotherapy consisting of 6 q. three-week doses of docetaxel/doxorubicin/cyclophosphamide given between January 2007 and may of 2007, with last dose on 04/20/2006. She underwent radiation therapy completed 07/11/2006, after which she began on tamoxifen in early August 2007.  She underwent hysterectomy  with bilateral salpingo-oophorectomy on 07/15/2008, and was started on letrozole in October of 2009.  A mammogram 11/10/2009 showed microcalcifications in the left breast, and a subsequent biopsy on 11/18/2009 confirmed invasive mammary carcinoma in the left breast. PET and CT of the chest showed no evidence of metastasis in January 2011.  She underwent bilateral mastectomies 01/25/2010, with the right breast showing no evidence of malignancy; in the left breast showing a 1.0 cm grade 2 invasive ductal carcinoma with high-grade DCIS. Tumor was ER +22%, PR negative, HER-2/neu negative, with proliferation marker of 13%. Margins were clear. Patient underwent concurrent left latissimus flap reconstruction and right implant reconstruction.  She received additional chemotherapy with one cycle of carboplatin/gemcitabine given on 03/11/2010. She then received 5 q. three-week cycles of IV CMF between 03/25/2010 and 06/17/2010.  She was started on exemestane in January 2012 and continued until late November 2013 when she was hospitalized for apparent TIA.   Her subsequent history is as detailed below  INTERVAL HISTORY: [name] returns today for followup of her metastatic breast cancer. Today is day 1, cycle 2 of eribulin. After being off last week, she has noticed numbness to the tips of her bilateral fingers that is light but always present. She also complains of tingling to this area but this comes and goes. She is not feeling any tingling today but the numbness is absolutely present.   REVIEW OF SYSTEMS: [name] denies fevers, chills, nausea, vomiting, or changes in bowel or bladder habits. She constantly experiences thrush, just better and worse versions of the same thing. She uses diflucan when she sees the tongue  coating, but has to use magic mouth wash for tongue soreness. Her heartburn has decreased somewhat with priolsec, but still experiences some at night. She calms her stomach with alkaseltzer tablets and  milk. These past 2 weeks she has experienced back spasms and upper right arm pain that is minimized with ibuprofen, but the pain and spasms always returns.   PAST MEDICAL HISTORY: Past Medical History  Diagnosis Date  . Breast cancer   . Depression   . Reflux   . Cancer   . Hx-TIA (transient ischemic attack) 11/22/2013  . Chronic headaches 11/22/2013  . Anxiety 11/22/2013  . Breast cancer metastasized to multiple sites 12/24/2013    PAST SURGICAL HISTORY: Past Surgical History  Procedure Laterality Date  . Mastectomy w/ nodes partial    . Mastectomy    . Breast reconstruction    . Portacath placement      x 2  . Portacath removal      x 2  . Abdominal hysterectomy    . Tee without cardioversion  11/12/2012    Procedure: TRANSESOPHAGEAL ECHOCARDIOGRAM (TEE);  Surgeon: Candee Furbish, MD;  Location: [facility];  Service: Cardiovascular;  Laterality: N/A;  This TEE may be Dr. Marlou Porch or a LHC Dr    FAMILY HISTORY Both parents are alive and well. Patient has one sister who is 54 years younger and is in good health. No other history of breast or ovarian cancer in the family. No family history on file.  GYNECOLOGIC HISTORY:   (Updated January 2015) G2P2, menarche at age 34 with irregular menses. On birth control pills in the past. On Clomid to induce ovulation with first pregnancy. Also had preeclampsia with first pregnancy. Status post hysterectomy and bilateral salpingo-oophorectomy in August 2009.  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 She is originally from [place]. She's been married to [place], for 13 years. He works as an Pharmacist, hospital at [employer].   ADVANCED DIRECTIVES:    HEALTH MAINTENANCE:  (Updated 11/22/2013) History  Substance Use Topics  . Smoking status: Never Smoker   . Smokeless tobacco: Never Used  . Alcohol Use: Yes     Comment: rarely     Colonoscopy:   Never  PAP: S/P LAVH/BSO in August 2009  Bone density: Never  Lipid panel: Not on file   Allergies  Allergen Reactions  . Afinitor [Everolimus] Hives    Current Outpatient Prescriptions  Medication Sig Dispense Refill  . acidophilus (RISAQUAD) CAPS capsule Take 1 capsule by mouth daily.    Marland Kitchen aspirin 81 MG tablet Take 81 mg by mouth daily.    . cetirizine (ZYRTEC) 10 MG tablet Take 10 mg by mouth daily. Pt takes this med at night    . Cholecalciferol 2000 UNITS CHEW Chew 1 tablet by mouth daily.    Marland Kitchen escitalopram (LEXAPRO) 20 MG tablet Take 20 mg by mouth daily with breakfast.     . fluconazole (DIFLUCAN) 100 MG tablet Take 1 tablet (100 mg total) by mouth daily. 20 tablet 12  . lidocaine-prilocaine (EMLA) cream Apply to affected area once 30 g 3  . LORazepam (ATIVAN) 0.5 MG tablet TAKE 1 TABLET BY MOUTH AT BEDTIME AS NEEDED 30 tablet 0  . Melatonin 10 MG TABS Take 1 tablet by mouth at bedtime.    . metoCLOPramide (REGLAN) 5 MG tablet Take 5 mg by mouth 4 (four) times daily.    Marland Kitchen  montelukast (SINGULAIR) 10 MG tablet Take 10 mg by mouth daily. Pt takes this med in the morning.    . Multiple Vitamin (MULTIVITAMIN) tablet Take 1 tablet by mouth daily.    Marland Kitchen omeprazole (PRILOSEC) 40 MG capsule Take 40 mg by mouth daily.     . ondansetron (ZOFRAN) 8 MG tablet Take 1 tablet (8 mg total) by mouth 2 (two) times daily. Start the day after chemo for 2 days. Then take as needed for nausea or vomiting. 30 tablet 1  . OVER THE COUNTER MEDICATION Take 2 drops by mouth 2 (two) times daily.    . cyclobenzaprine (FLEXERIL) 5 MG tablet Take 1 tablet (5 mg total) by mouth 3 (three) times daily as needed for muscle spasms. 30 tablet 1  . dexamethasone (DECADRON) 4 MG tablet Take 2 tablets (8 mg total) by mouth 2 (two) times daily with a meal. Start the day after chemotherapy for 2 days. Take with food. 30 tablet 1  . OVER THE COUNTER MEDICATION Take 2 capsules by mouth 3 (three) times daily.    Marland Kitchen OVER THE  COUNTER MEDICATION Take 1 drop by mouth daily as needed (FOR ACID REFLUX).    Marland Kitchen prochlorperazine (COMPAZINE) 10 MG tablet Take 1 tablet (10 mg total) by mouth every 6 (six) hours as needed (Nausea or vomiting). (Patient not taking: Reported on 02/06/2015) 30 tablet 1   No current facility-administered medications for this visit.    OBJECTIVE: Young white woman in no acute distress Filed Vitals:   02/06/15 1000  BP: 133/76  Pulse: 92  Temp: 98.6 F (37 C)  Resp: 18  Body mass index is 34.78 kg/(m^2).  ECOG: 1 Filed Weights   02/06/15 1000  Weight: 196 lb 4.8 oz (89.041 kg)   Skin: warm, dry  HEENT: sclerae anicteric, conjunctivae pink, oropharynx clear. No thrush or mucositis.  Lymph Nodes: No cervical or supraclavicular lymphadenopathy  Lungs: clear to auscultation bilaterally, no rales, wheezes, or rhonci  Heart: regular rate and rhythm  Abdomen: round, soft, non tender, positive bowel sounds  Musculoskeletal: No focal spinal tenderness, no peripheral edema  Neuro: non focal, well oriented, positive affect  Breasts: bilateral breast status post mastectomies and reconstruction. No evidence of lumps or suspicious masses. Bilateral axillae benign.  LAB RESULTS:   Lab Results  Component Value Date   WBC 5.3 02/06/2015   NEUTROABS 3.8 02/06/2015   HGB 11.1* 02/06/2015   HCT 35.5 02/06/2015   MCV 93.7 02/06/2015   PLT 276 02/06/2015      Chemistry      Component Value Date/Time   NA 140 01/21/2015 1616   NA 142 11/12/2012 0735   K 3.3* 01/21/2015 1616   K 3.8 11/12/2012 0735   CL 104 11/14/2012 1407   CL 104 11/12/2012 0735   CO2 27 01/21/2015 1616   CO2 30 11/12/2012 0735   BUN 20.2 01/21/2015 1616   BUN 13 11/12/2012 0735   CREATININE 1.0 01/21/2015 1616   CREATININE 0.97 11/12/2012 0735      Component Value Date/Time   CALCIUM 8.9 01/21/2015 1616   CALCIUM 9.1 11/12/2012 0735   ALKPHOS 64 01/21/2015 1616   ALKPHOS 69 11/12/2012 0735   AST 27 01/21/2015 1616    AST 16 11/12/2012 0735   ALT 24 01/21/2015 1616   ALT 13 11/12/2012 0735   BILITOT 0.96 01/21/2015 1616   BILITOT 0.9 11/12/2012 0735      STUDIES: Dg Wrist Complete Right  01/08/2015  CLINICAL DATA:  Acute right wrist pain after falling last night. Initial encounter.  EXAM: RIGHT WRIST - COMPLETE 3+ VIEW  COMPARISON:  None.  FINDINGS: There is no evidence of fracture or dislocation. There is no evidence of arthropathy or other focal bone abnormality. Soft tissues are unremarkable.  IMPRESSION: Normal right wrist.   Electronically Signed   By: Sabino Dick M.D.   On: 01/08/2015 12:00    ASSESSMENT: 49 y.o. [place] woman  (1)  status post left lumpectomy under the care of Dr. 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 2 of 23 lymph nodes were involved.  Margins were clear.  (2) status post adjuvant chemotherapy consisting of 6 q. three-week doses of docetaxel/doxorubicin/cyclophosphamide given between January 2007 and may of 2007, with last dose on 04/20/2006.  (3) status post radiation therapy to the left breast, completed 07/11/2006  (4) began on tamoxifen in early August 2007 and continued until October 2009. She status post hysterectomy with bilateral salpingo-oophorectomy on 07/15/2008, and was started on letrozole in October of 2009.  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 PET and CT of the chest showed no evidence of metastasis in January 2011.  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 Margins were clear. Patient underwent concurrent left  latissimus flap reconstruction and right implant reconstruction.  (7)  status post additional chemotherapy with one cycle of carboplatin/gemcitabine given on 03/11/2010. She then received 5 q. three-week cycles of IV CMF between 03/25/2010 and 06/17/2010.  (8) started on exemestane in January 2012 and continued until late November 2013 when she was hospitalized for apparent TIA at which time her exemestane was stopped and not resumed  (9) METASTATIC DISEASE: evidence of disease recurence noted on chest CT, liver MRI and PET scan in December 2014, with suspicious lung nodules, lymphadenopathy, and 3 lesions in the right lobe of the liver, but no evidence of bony disease and no evidence of brain involvement by brain MRI September 2014. Biopsy of a left supraclavicular lymph node on 12/11/2013 confirmed metastatic carcinoma, estrogen receptor 45% positive, progesterone receptor 17% positive, with no HER-2 amplification.  (10) fulvestrant started 12/03/2013, discontinued 03/25/2014 with progression  (11) exemestane/ everolimus started 04/04/2014; everolimus held 04/14/2014, with skin rash and mouth soreness; resumed at 5 mg a day as of 04/29/2014, discontinued 05/09/2014 with similar symptoms  (12) continueing exemestane, adding palbociclib 05/26/2014;   (a) palbociclib dose dropped to 75 mg every other day for 21 days beginning with cycle 4  (b) exemestane and Palbociclib discontinued December 2015 with evidence of progression  (13) [facility] referral placed 04/28/2049 -- re-sent 05/23/2014  (14) [facility] one test requested 12/10/2014  (15) to start eribulin 12/25/2014, with restaging studies to follow after 3-4 cycles   PLAN:  The labs were reviewed in detail and were entirely stable. Unfortunately, due to persistent neuropathy symptoms, [name] has advised to withhold treatment today, to see if they might resolve some. If they continue to persist, we may need to find an alternative  treatment as to not create lasting nerve damage.   For her back spasm and associated arm pains, I have  prescribed her a low dose of flexeril to help minimize this. We discussed drowsiness as the main side effect, and she knows not to drive or operate heavy machinery while on this drug. A PET scan performed in late December documented no evidence of skeletal metastasis.  Laquonda will return next week for labs, a follow up visit, and hopefully the restart of treatment. She understands and agrees with this plan. She knows the goal of treatment in her case is control. She has been encouraged to call with any issues that might arise before her next visit here.   Marcelino Duster, NP   02/06/2015 12:05 PM

## 2015-02-10 ENCOUNTER — Other Ambulatory Visit: Payer: Self-pay | Admitting: Nurse Practitioner

## 2015-02-10 DIAGNOSIS — G620 Drug-induced polyneuropathy: Secondary | ICD-10-CM

## 2015-02-10 DIAGNOSIS — T451X5A Adverse effect of antineoplastic and immunosuppressive drugs, initial encounter: Principal | ICD-10-CM

## 2015-02-11 ENCOUNTER — Telehealth: Payer: Self-pay | Admitting: Nurse Practitioner

## 2015-02-11 NOTE — Telephone Encounter (Signed)
per pof ot sch pt appt-tlkwed w/[facility] and she stated appt sch-she will call pt w/appt time/location

## 2015-02-12 ENCOUNTER — Other Ambulatory Visit: Payer: Self-pay

## 2015-02-13 ENCOUNTER — Other Ambulatory Visit: Payer: Self-pay | Admitting: Oncology

## 2015-02-13 ENCOUNTER — Ambulatory Visit: Payer: Self-pay | Admitting: Nurse Practitioner

## 2015-02-13 ENCOUNTER — Other Ambulatory Visit (HOSPITAL_BASED_OUTPATIENT_CLINIC_OR_DEPARTMENT_OTHER): Payer: 59

## 2015-02-13 ENCOUNTER — Ambulatory Visit: Payer: 59

## 2015-02-13 ENCOUNTER — Other Ambulatory Visit: Payer: Self-pay

## 2015-02-13 ENCOUNTER — Ambulatory Visit (HOSPITAL_BASED_OUTPATIENT_CLINIC_OR_DEPARTMENT_OTHER): Payer: 59 | Admitting: Nurse Practitioner

## 2015-02-13 ENCOUNTER — Encounter: Payer: Self-pay | Admitting: Nurse Practitioner

## 2015-02-13 VITALS — BP 143/93 | HR 92 | Temp 98.4°F | Resp 18 | Ht 63.0 in | Wt 198.2 lb

## 2015-02-13 DIAGNOSIS — C50312 Malignant neoplasm of lower-inner quadrant of left female breast: Secondary | ICD-10-CM

## 2015-02-13 DIAGNOSIS — C50919 Malignant neoplasm of unspecified site of unspecified female breast: Secondary | ICD-10-CM

## 2015-02-13 DIAGNOSIS — C770 Secondary and unspecified malignant neoplasm of lymph nodes of head, face and neck: Secondary | ICD-10-CM

## 2015-02-13 DIAGNOSIS — R12 Heartburn: Secondary | ICD-10-CM

## 2015-02-13 DIAGNOSIS — Z8673 Personal history of transient ischemic attack (TIA), and cerebral infarction without residual deficits: Secondary | ICD-10-CM

## 2015-02-13 DIAGNOSIS — G620 Drug-induced polyneuropathy: Secondary | ICD-10-CM

## 2015-02-13 DIAGNOSIS — C787 Secondary malignant neoplasm of liver and intrahepatic bile duct: Secondary | ICD-10-CM

## 2015-02-13 DIAGNOSIS — R519 Headache, unspecified: Secondary | ICD-10-CM

## 2015-02-13 DIAGNOSIS — R51 Headache: Secondary | ICD-10-CM

## 2015-02-13 DIAGNOSIS — M62830 Muscle spasm of back: Secondary | ICD-10-CM

## 2015-02-13 LAB — COMPREHENSIVE METABOLIC PANEL (CC13)
ALK PHOS: 67 U/L (ref 40–150)
ALT: 14 U/L (ref 0–55)
AST: 21 U/L (ref 5–34)
Albumin: 3.4 g/dL — ABNORMAL LOW (ref 3.5–5.0)
Anion Gap: 12 mEq/L — ABNORMAL HIGH (ref 3–11)
BUN: 16 mg/dL (ref 7.0–26.0)
CO2: 26 mEq/L (ref 22–29)
Calcium: 9.7 mg/dL (ref 8.4–10.4)
Chloride: 104 mEq/L (ref 98–109)
Creatinine: 0.9 mg/dL (ref 0.6–1.1)
EGFR: 81 mL/min/{1.73_m2} — AB (ref >90)
Glucose: 83 mg/dl (ref 70–140)
Potassium: 3.5 mEq/L (ref 3.5–5.1)
SODIUM: 142 meq/L (ref 136–145)
Total Bilirubin: 0.39 mg/dL (ref 0.20–1.20)
Total Protein: 6.9 g/dL (ref 6.4–8.3)

## 2015-02-13 LAB — CBC WITH DIFFERENTIAL/PLATELET
BASO%: 0.4 % (ref 0.0–2.0)
Basophils Absolute: 0 10*3/uL (ref 0.0–0.1)
EOS ABS: 0 10*3/uL (ref 0.0–0.5)
EOS%: 0.3 % (ref 0.0–7.0)
HCT: 38.6 % (ref 34.8–46.6)
HEMOGLOBIN: 12.3 g/dL (ref 11.6–15.9)
LYMPH%: 20.3 % (ref 14.0–49.7)
MCH: 30.4 pg (ref 25.1–34.0)
MCHC: 31.9 g/dL (ref 31.5–36.0)
MCV: 95.3 fL (ref 79.5–101.0)
MONO#: 0.4 10*3/uL (ref 0.1–0.9)
MONO%: 6 % (ref 0.0–14.0)
NEUT#: 5 10*3/uL (ref 1.5–6.5)
NEUT%: 73 % (ref 38.4–76.8)
Platelets: 322 10*3/uL (ref 145–400)
RBC: 4.05 10*6/uL (ref 3.70–5.45)
RDW: 15.1 % — ABNORMAL HIGH (ref 11.2–14.5)
WBC: 6.9 10*3/uL (ref 3.9–10.3)
lymph#: 1.4 10*3/uL (ref 0.9–3.3)

## 2015-02-13 MED ORDER — SUCRALFATE 1 G PO TABS: 1.0000 g | ORAL_TABLET | Freq: Three times a day (TID) | ORAL | Status: DC

## 2015-02-13 NOTE — Progress Notes (Signed)
ID: Kristin Hoffman OB: Jul 07, 1966  MR#: [id number]  BZJ#:[id number]  PCP: Drake Leach, MD GYN:  [name] SU: [name] OTHER MD: [name]  CHIEF COMPLAINT:  Stage IV breast cancer CURRENT TREATMENT: eribulin  BREAST CANCER HISTORY: This patient was previously followed by [name], and was transferred to [name] service as of 08/20/2013.  At the age of 49, the patient had a screening mammogram as a baseline. She had recently given birth and was on birth control pills at that time. The mammogram in November 2006 showed an area of architectural distortion in the left lower inner quadrant. Subsequently an ultrasound was obtained of the left breast showing a 2.0 x 1.5 x 1.4 cm mass, at the 8:00 position, 4 cm from the nipple. A core biopsy performed on 10/21/2005 showed an invasive mammary carcinoma, ER +70%, PR +41%, HER-2/neu negative, with proliferation marker of 38%. [phone])  Breast MRI on 11/08/2005 confirmed a 2.5 cm spiculated mass in the left lower inner quadrant with 3 small satellite nodules adjacent to the primary mass: 3.5 cm posterior medial to the primary mass, measuring 9 mm; 1.5 cm anterior medial to the primary mass measuring 5 mm; and 2 cm medial to the primary mass measuring 5 mm. No axillary adenopathy was noted. No suspicious masses or enhancement are noted in the right breast.  Laurita underwentt left lumpectomy under the care of Dr. 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 2 of 23 lymph nodes were involved.  Margins were clear.  She received adjuvant chemotherapy consisting of 6 q. three-week doses of docetaxel/doxorubicin/cyclophosphamide given between January 2007 and may of 2007, with last dose on 04/20/2006. She underwent radiation therapy completed 07/11/2006, after which she began on tamoxifen in early August 2007.  She underwent hysterectomy  with bilateral salpingo-oophorectomy on 07/15/2008, and was started on letrozole in October of 2009.  A mammogram 11/10/2009 showed microcalcifications in the left breast, and a subsequent biopsy on 11/18/2009 confirmed invasive mammary carcinoma in the left breast. PET and CT of the chest showed no evidence of metastasis in January 2011.  She underwent bilateral mastectomies 01/25/2010, with the right breast showing no evidence of malignancy; in the left breast showing a 1.0 cm grade 2 invasive ductal carcinoma with high-grade DCIS. Tumor was ER +22%, PR negative, HER-2/neu negative, with proliferation marker of 13%. Margins were clear. Patient underwent concurrent left latissimus flap reconstruction and right implant reconstruction.  She received additional chemotherapy with one cycle of carboplatin/gemcitabine given on 03/11/2010. She then received 5 q. three-week cycles of IV CMF between 03/25/2010 and 06/17/2010.  She was started on exemestane in January 2012 and continued until late November 2013 when she was hospitalized for apparent TIA.   Her subsequent history is as detailed below  INTERVAL HISTORY: Kristin Hoffman returns today for followup of her metastatic breast cancer, accompanied by a friend. Last week the start of cycle 3 was held because of persistent neuropathy. Unfortunately, even with the week off the numbness is no better, but certainly no worse. Her heartburn is "back with a vengeance" despite improving last week. She has tried prilosec, tums, and reglan at this point. She even stopped the vitamin C supplements. She took the flexeril this past week for her back spasms, and it didn't do anything to lessen them.   REVIEW OF SYSTEMS: Kristin Hoffman denies fevers, chills, nausea, vomiting, or changes in bowel or  bladder habits. Her thrush has improved with the use of diflucan. Her appetite and energy level are fair. She sleeps well at night. She denies shortness of breath, chest pain ,cough, or  palpitations. She has no headaches, dizziness, unexplained weight loss, or night sweats. A detailed review of systems is otherwise stable.   PAST MEDICAL HISTORY: Past Medical History  Diagnosis Date  . Breast cancer   . Depression   . Reflux   . Cancer   . Hx-TIA (transient ischemic attack) 11/22/2013  . Chronic headaches 11/22/2013  . Anxiety 11/22/2013  . Breast cancer metastasized to multiple sites 12/24/2013    PAST SURGICAL HISTORY: Past Surgical History  Procedure Laterality Date  . Mastectomy w/ nodes partial    . Mastectomy    . Breast reconstruction    . Portacath placement      x 2  . Portacath removal      x 2  . Abdominal hysterectomy    . Tee without cardioversion  11/12/2012    Procedure: TRANSESOPHAGEAL ECHOCARDIOGRAM (TEE);  Surgeon: Candee Furbish, MD;  Location: [facility] ENDOSCOPY;  Service: Cardiovascular;  Laterality: N/A;  This TEE may be Dr. Marlou Porch or a LHC Dr    FAMILY HISTORY Both parents are alive and well. Patient has one sister who is 65 years younger and is in good health. No other history of breast or ovarian cancer in the family. No family history on file.  GYNECOLOGIC HISTORY:   (Updated January 2015) G2P2, menarche at age 49 with irregular menses. On birth control pills in the past. On Clomid to induce ovulation with first pregnancy. Also had preeclampsia with first pregnancy. Status post hysterectomy and bilateral salpingo-oophorectomy in August 2009.  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 She is originally from [place]. She's been married to [place], for 13 years. He works as an Pharmacist, hospital at [employer].   ADVANCED DIRECTIVES:    HEALTH MAINTENANCE:  (Updated 11/22/2013) History  Substance Use Topics  . Smoking status: Never Smoker   . Smokeless tobacco: Never Used  . Alcohol Use: Yes     Comment: rarely     Colonoscopy:  Never  PAP: S/P  LAVH/BSO in August 2009  Bone density: Never  Lipid panel: Not on file   Allergies  Allergen Reactions  . Afinitor [Everolimus] Hives    Current Outpatient Prescriptions  Medication Sig Dispense Refill  . acidophilus (RISAQUAD) CAPS capsule Take 1 capsule by mouth daily.    Marland Kitchen aspirin 81 MG tablet Take 81 mg by mouth daily.    . cetirizine (ZYRTEC) 10 MG tablet Take 10 mg by mouth daily. Pt takes this med at night    . Cholecalciferol 2000 UNITS CHEW Chew 1 tablet by mouth daily.    . cyclobenzaprine (FLEXERIL) 5 MG tablet Take 1 tablet (5 mg total) by mouth 3 (three) times daily as needed for muscle spasms. 30 tablet 1  . dexamethasone (DECADRON) 4 MG tablet Take 2 tablets (8 mg total) by mouth 2 (two) times daily with a meal. Start the day after chemotherapy for 2 days. Take with food. 30 tablet 1  . escitalopram (LEXAPRO) 20 MG tablet Take 20 mg by mouth daily with breakfast.     . fluconazole (DIFLUCAN) 100 MG tablet Take 1 tablet (100 mg total) by mouth daily. 20 tablet 12  . lidocaine-prilocaine (EMLA) cream Apply to affected  area once 30 g 3  . LORazepam (ATIVAN) 0.5 MG tablet TAKE 1 TABLET BY MOUTH AT BEDTIME AS NEEDED 30 tablet 0  . Melatonin 10 MG TABS Take 1 tablet by mouth at bedtime.    . metoCLOPramide (REGLAN) 5 MG tablet Take 5 mg by mouth 4 (four) times daily.    . montelukast (SINGULAIR) 10 MG tablet Take 10 mg by mouth daily. Pt takes this med in the morning.    . Multiple Vitamin (MULTIVITAMIN) tablet Take 1 tablet by mouth daily.    Marland Kitchen omeprazole (PRILOSEC) 40 MG capsule Take 40 mg by mouth daily.     . ondansetron (ZOFRAN) 8 MG tablet Take 1 tablet (8 mg total) by mouth 2 (two) times daily. Start the day after chemo for 2 days. Then take as needed for nausea or vomiting. 30 tablet 1  . OVER THE COUNTER MEDICATION Take 2 capsules by mouth 3 (three) times daily.    Marland Kitchen OVER THE COUNTER MEDICATION Take 2 drops by mouth 2 (two) times daily.    Marland Kitchen OVER THE COUNTER MEDICATION  Take 1 drop by mouth daily as needed (FOR ACID REFLUX).    Marland Kitchen prochlorperazine (COMPAZINE) 10 MG tablet Take 1 tablet (10 mg total) by mouth every 6 (six) hours as needed (Nausea or vomiting). (Patient not taking: Reported on 02/06/2015) 30 tablet 1   No current facility-administered medications for this visit.    OBJECTIVE: Young white woman in no acute distress Filed Vitals:   02/13/15 1444  BP: 143/93  Pulse: 92  Temp: 98.4 F (36.9 C)  Resp: 18  Body mass index is 35.12 kg/(m^2).  ECOG: 1 Filed Weights   02/13/15 1444  Weight: 198 lb 3.2 oz (89.903 kg)   Skin: warm, dry  HEENT: sclerae anicteric, conjunctivae pink, oropharynx clear. No thrush or mucositis.  Lymph Nodes: No cervical or supraclavicular lymphadenopathy  Lungs: clear to auscultation bilaterally, no rales, wheezes, or rhonci  Heart: regular rate and rhythm  Abdomen: round, soft, non tender, positive bowel sounds  Musculoskeletal: No focal spinal tenderness, no peripheral edema  Neuro: non focal, well oriented, positive affect  Breasts: deferred  LAB RESULTS:   Lab Results  Component Value Date   WBC 6.9 02/13/2015   NEUTROABS 5.0 02/13/2015   HGB 12.3 02/13/2015   HCT 38.6 02/13/2015   MCV 95.3 02/13/2015   PLT 322 02/13/2015      Chemistry      Component Value Date/Time   NA 142 02/13/2015 1445   NA 142 11/12/2012 0735   K 3.5 02/13/2015 1445   K 3.8 11/12/2012 0735   CL 104 11/14/2012 1407   CL 104 11/12/2012 0735   CO2 26 02/13/2015 1445   CO2 30 11/12/2012 0735   BUN 16.0 02/13/2015 1445   BUN 13 11/12/2012 0735   CREATININE 0.9 02/13/2015 1445   CREATININE 0.97 11/12/2012 0735      Component Value Date/Time   CALCIUM 9.7 02/13/2015 1445   CALCIUM 9.1 11/12/2012 0735   ALKPHOS 67 02/13/2015 1445   ALKPHOS 69 11/12/2012 0735   AST 21 02/13/2015 1445   AST 16 11/12/2012 0735   ALT 14 02/13/2015 1445   ALT 13 11/12/2012 0735   BILITOT 0.39 02/13/2015 1445   BILITOT 0.9 11/12/2012 0735       STUDIES: No results found.  ASSESSMENT: 49 y.o. [place] woman  (1)  status post left lumpectomy under the care of Dr. [name] on 11/18/2005 for a 2.5 cm  grade 3 invasive ductal carcinoma, ER +70%, PR +41%, HER-2/neu negative, with proliferation marker of 38%. 2 of 23 lymph nodes were involved.  Margins were clear.  (2) status post adjuvant chemotherapy consisting of 6 q. three-week doses of docetaxel/doxorubicin/cyclophosphamide given between January 2007 and may of 2007, with last dose on 04/20/2006.  (3) status post radiation therapy to the left breast, completed 07/11/2006  (4) began on tamoxifen in early August 2007 and continued until October 2009. She status post hysterectomy with bilateral salpingo-oophorectomy on 07/15/2008, and was started on letrozole in October of 2009.  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 PET and CT of the chest showed no evidence of metastasis in January 2011.  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 Margins were clear. Patient underwent concurrent left latissimus flap reconstruction and right implant reconstruction.  (7)  status post additional chemotherapy with one cycle of carboplatin/gemcitabine given on 03/11/2010. She then received 5 q. three-week cycles of IV CMF between 03/25/2010 and 06/17/2010.  (8) started on exemestane in January 2012 and continued until late November 2013 when she was hospitalized for apparent TIA at which time her exemestane was stopped and not resumed  (9) METASTATIC DISEASE: evidence of disease recurence noted on chest CT, liver MRI and PET scan in December 2014, with suspicious  lung nodules, lymphadenopathy, and 3 lesions in the right lobe of the liver, but no evidence of bony disease and no evidence of brain involvement by brain MRI September 2014. Biopsy of a left supraclavicular lymph node on 12/11/2013 confirmed metastatic carcinoma, estrogen receptor 45% positive, progesterone receptor 17% positive, with no HER-2 amplification.  (10) fulvestrant started 12/03/2013, discontinued 03/25/2014 with progression  (11) exemestane/ everolimus started 04/04/2014; everolimus held 04/14/2014, with skin rash and mouth soreness; resumed at 5 mg a day as of 04/29/2014, discontinued 05/09/2014 with similar symptoms  (12) continueing exemestane, adding palbociclib 05/26/2014;   (a) palbociclib dose dropped to 75 mg every other day for 21 days beginning with cycle 4  (b) exemestane and Palbociclib discontinued December 2015 with evidence of progression  (13) [facility] referral placed 04/28/2049 -- re-sent 05/23/2014  (14) [facility] one test requested 12/10/2014  (15) to start eribulin 12/25/2014, with restaging studies to follow after 3-4 cycles. Stopped after 2 cycles because of persistent neuropathy.    PLAN:  I consulted with [name] regarding her persistent neuropathy symptoms, and he suggested we stop therapy with eribulin altogether. I explained to the patient that continuing to use this drug could result in permanent and progressive nerve damage. It would not be prudent to continuing to treat with these symptoms present so early in her treatment. She will proceed with the liver MRI on 3/16 and follow up with [name] 2 days later to discuss a new treatment plan.   For her heartburn, I have added carafate to her regimen in combination with the omeprazole. She will try this as directed for at least 2 weeks. If the heartburn continues at this intensity she may need a GI referral.   Though she had a PET scan that was negative for spinal lesions in December, I am ordering  an MRI of the thoracic spine to evaluate the source of her  back pain/spasms since she is already planning for an MRI of the liver. In the meantime, she will increase the dose of her flexeril from 5 to 55m as the former was ineffective and caused no drowsiness.   Amalia understands and agrees with this plan. She knows the goal of treatment in her case is control. She has been encouraged to call with any issues that might arise before her next visit here.   FMarcelino Duster NP   02/13/2015 3:42 PM

## 2015-02-16 ENCOUNTER — Other Ambulatory Visit: Payer: Self-pay | Admitting: Oncology

## 2015-02-16 ENCOUNTER — Encounter: Payer: Self-pay | Admitting: Oncology

## 2015-02-16 ENCOUNTER — Telehealth: Payer: Self-pay | Admitting: *Deleted

## 2015-02-16 NOTE — Telephone Encounter (Signed)
Called pt and discussed. Also instructed pt to increase her reglan from 5 mg QID to 10 mg ACHS  Thanks!

## 2015-02-16 NOTE — Telephone Encounter (Signed)
Patient called and stated,"I've had horrible indigestion this weekend. I saw Nira Conn, NP, last week and she started me on Carafate 02/13/15. How come it has not gone away?" I instructed patient that she hasn't given the Carafate enough time to work. Patient stated,"I thought it would be instant." Instructed patient to keep taking Carafate and Omeprazole until she sees [name] on 02/27/15. At that time, they can review the medications. Instructed patient that if indigestion gets worse to call [facility] before her scheduled appointment time with [name]. Patient verbalized understanding of giving the medications longer to work.

## 2015-02-18 ENCOUNTER — Telehealth: Payer: Self-pay | Admitting: *Deleted

## 2015-02-18 NOTE — Telephone Encounter (Signed)
Pt states she doubled the reglan as instructed by Dr Jana Hakim, continued omeprazole and carafate. Had good relief on Monday night. Continued same meds, elevated head but had "horrible indigestion" again last night, could not sleep. Continues to have indigestion during day as well. Wanted to make Dr Jana Hakim aware and see if he has any other suggestions.

## 2015-02-18 NOTE — Telephone Encounter (Signed)
Don't have too many other ideas-- does she have any clue what was different food wise Mon-Tues?

## 2015-02-19 ENCOUNTER — Other Ambulatory Visit: Payer: Self-pay | Admitting: *Deleted

## 2015-02-19 ENCOUNTER — Telehealth: Payer: Self-pay | Admitting: *Deleted

## 2015-02-19 DIAGNOSIS — R1011 Right upper quadrant pain: Secondary | ICD-10-CM

## 2015-02-19 DIAGNOSIS — R12 Heartburn: Secondary | ICD-10-CM

## 2015-02-19 DIAGNOSIS — C50919 Malignant neoplasm of unspecified site of unspecified female breast: Secondary | ICD-10-CM

## 2015-02-19 NOTE — Telephone Encounter (Signed)
Pt left a voice mail requesting to have gall bladder checked. States her mother had similar problems with indigestion and it was gall bladder.

## 2015-02-19 NOTE — Telephone Encounter (Signed)
This RN returned call to pt per note from [facility] stating concern of ongoing abd pain and " could it be my gallbladder ".  Per call to pt she states ongoing episodic upper abd pain that is minamially improved with current therapy regimens. She states "this is the same type of pain my mother says she had when she had gallbladder attack"  Order obtained and entered per above.  Pt will continue on current medications prescribed and if pain worsens she will call or proceed to the ER.

## 2015-02-20 ENCOUNTER — Ambulatory Visit: Payer: 59 | Attending: Oncology | Admitting: Physical Therapy

## 2015-02-20 DIAGNOSIS — G622 Polyneuropathy due to other toxic agents: Secondary | ICD-10-CM | POA: Diagnosis present

## 2015-02-20 DIAGNOSIS — G620 Drug-induced polyneuropathy: Secondary | ICD-10-CM

## 2015-02-20 DIAGNOSIS — T451X5A Adverse effect of antineoplastic and immunosuppressive drugs, initial encounter: Secondary | ICD-10-CM

## 2015-02-20 NOTE — Therapy (Signed)
[place], [place], [address] Phone: [phone]   Fax:  [phone]  Physical Therapy Evaluation  Patient Details  Name: Kristin Hoffman MRN: [id number] Date of Birth: 16-May-1966 Referring Provider:  Chauncey Cruel, MD  Encounter Date: 02/20/2015      PT End of Session - 02/20/15 1014    Visit Number 1   Number of Visits 5   Date for PT Re-Evaluation 03/22/15   PT Start Time 0940   PT Stop Time 1010   PT Time Calculation (min) 30 min   Activity Tolerance Patient tolerated treatment well   Behavior During Therapy [facility] for tasks assessed/performed      Past Medical History  Diagnosis Date  . Breast cancer   . Depression   . Reflux   . Cancer   . Hx-TIA (transient ischemic attack) 11/22/2013  . Chronic headaches 11/22/2013  . Anxiety 11/22/2013  . Breast cancer metastasized to multiple sites 12/24/2013    Past Surgical History  Procedure Laterality Date  . Mastectomy w/ nodes partial    . Mastectomy    . Breast reconstruction    . Portacath placement      x 2  . Portacath removal      x 2  . Abdominal hysterectomy    . Tee without cardioversion  11/12/2012    Procedure: TRANSESOPHAGEAL ECHOCARDIOGRAM (TEE);  Surgeon: Candee Furbish, MD;  Location: [facility] ENDOSCOPY;  Service: Cardiovascular;  Laterality: N/A;  This TEE may be Dr. Marlou Porch or a LHC Dr    There were no vitals filed for this visit.  Visit Diagnosis:  Peripheral neuropathy due to chemotherapy - Plan: PT plan of care cert/re-cert      Subjective Assessment - 02/20/15 0944    Symptoms Here about numbness in fingertips; chemotherapy was stopped because of this. Doesn't notice problems inher feet.  Patient has attended ABC class in the past so has some knowledge about lymphedema.                                                          Pertinent History Trying to get an MRI of the abdomen soon to see if GI issues are gall bladder.   2006 baseline mammogram with subsequent lumpectomy in left breast (23 nodes removed), chemo, and radiation; 2010 found a new cancer in left breast so had double mastectomy and reconstruction.  Had chemo and no evidence of disease.  Four years later did a CT chest and found a lesion in the liver;  mets to liver, nodes nearby, chest, and maybe spot in each lung.  Spent 2015 trying different medicines.  Had three chemo treatments and that was stopped.  MRI will help make a new plan, and may go to [facility] for clinical trial.   Currently in Pain? Yes   Pain Score 10-Worst pain ever   Pain Location Other (Comment)  stomach area, below bra   Pain Descriptors / Indicators Constant   Pain Relieving Factors nothing   Effect of Pain on Daily Activities can't sleep   Multiple Pain Sites No            [facility] PT Assessment - 02/20/15 0001    Assessment   Medical Diagnosis left breast cancer with bilateral mastectomy, now with mets  Precautions   Precautions Other (comment)  cancer precautions; metastastic, but no known bony  mets   Restrictions   Weight Bearing Restrictions No   Balance Screen   Has the patient fallen in the past 6 months No   Has the patient had a decrease in activity level because of a fear of falling?  No   Is the patient reluctant to leave their home because of a fear of falling?  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   Observation/Other Assessments   Other Surveys  Select   Sensation   Light Touch Appears Intact  but feels numbness at fingertips beyond DIP joints   Posture/Postural Control   Posture/Postural Control Postural limitations    Postural Limitations Forward head   ROM / Strength   AROM / PROM / Strength AROM;Strength   AROM   Overall AROM Comments UE AROM WFL throughout   Strength   Overall Strength Comments UEs grossly 5/5 throughout   Strength Assessment Site Hand   Right/Left hand Right;Left   Grip (lbs) 56/50/49  right hand   Grip (lbs) 48/48/49  left hand              Katina Dung - 02/20/15 0001    Open a tight or new jar Mild difficulty   Do heavy household chores (wash walls, wash floors) Mild difficulty   Carry a shopping bag or briefcase No difficulty   Wash your back No difficulty   Use a knife to cut food No difficulty   Recreational activities in which you take some force or impact through your arm, shoulder, or hand (golf, hammering, tennis) No difficulty   During the past week, to what extent has your arm, shoulder or hand problem interfered with your normal social activities with family, friends, neighbors, or groups? Not at all   During the past week, to what extent has your arm, shoulder or hand problem limited your work or other regular daily activities Not at all   Arm, shoulder, or hand pain. None   Tingling (pins and needles) in your arm, shoulder, or hand Mild   Difficulty Sleeping No difficulty   DASH Score 6.82 %                     PT Education - 02/20/15 1012    Education provided Yes   Education Details contrast bath instructions for home trial, 3 mins. warm (105-110 degrees), then 1 min. cool (55-65 degrees) for total of 20 minutes   Person(s) Educated Patient   Methods Explanation;Handout   Comprehension Verbalized understanding                [place] - 02/20/15 1140    CC Long Term Goal  #1   Title Patient will report benefit of at least one modality for decreasing symptoms of CIPN.   Time 4   Period Weeks   Status New   CC Long Term Goal  #2   Title Pt. will be independent in home management of CIPN symptoms.   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   Pt will benefit from skilled therapeutic intervention in order to improve on the following deficits Impaired sensation   Rehab Potential Fair   PT Frequency 1x / week   PT Duration 4 weeks   PT Treatment/Interventions Patient/family education;Contrast Bath;Manual techniques;Electrical Stimulation;Other (comment)  Home TENS instruction if electrical stim beneficial   PT Next Visit Plan trial of massage to fingertips, fingers, and hands; trial of electrical stimulation; if beneficial, give home instructions and consider further therapy; pursue TENS if estim is beneficial   PT Home Exercise Plan trial of contrast baths as per instructions   Consulted and Agree with Plan of Care Patient         Problem List Patient Active Problem List   Diagnosis Date Noted  . Heart burn 02/13/2015  . Back spasm 02/06/2015  . Chemotherapy-induced neuropathy 02/06/2015  . Drug induced neutropenia(288.03) 08/04/2014  . Breast cancer metastasized to multiple sites 12/24/2013  . Cancer of lower-inner quadrant of left female breast 12/03/2013  . Hx-TIA (transient ischemic attack) 11/22/2013  . Chronic headaches 11/22/2013  . Anxiety 11/22/2013    S[place]IS[place],DONNA 02/20/2015, 11:43 AM  [place], [place], [address] Phone: [phone]   Fax:  [place], PT 02/20/2015 11:43 AM

## 2015-02-23 ENCOUNTER — Other Ambulatory Visit: Payer: Self-pay | Admitting: Oncology

## 2015-02-25 ENCOUNTER — Telehealth: Payer: Self-pay | Admitting: *Deleted

## 2015-02-25 ENCOUNTER — Ambulatory Visit (HOSPITAL_COMMUNITY)
Admission: RE | Admit: 2015-02-25 | Discharge: 2015-02-25 | Disposition: A | Discharge status: Home / Self Care | Payer: 59 | Source: Ambulatory Visit | Attending: Oncology | Admitting: Oncology

## 2015-02-25 ENCOUNTER — Other Ambulatory Visit: Payer: Self-pay | Admitting: Oncology

## 2015-02-25 ENCOUNTER — Other Ambulatory Visit: Payer: Self-pay | Admitting: *Deleted

## 2015-02-25 DIAGNOSIS — C50312 Malignant neoplasm of lower-inner quadrant of left female breast: Secondary | ICD-10-CM

## 2015-02-25 DIAGNOSIS — Z853 Personal history of malignant neoplasm of breast: Secondary | ICD-10-CM | POA: Insufficient documentation

## 2015-02-25 DIAGNOSIS — C50919 Malignant neoplasm of unspecified site of unspecified female breast: Secondary | ICD-10-CM

## 2015-02-25 DIAGNOSIS — C787 Secondary malignant neoplasm of liver and intrahepatic bile duct: Secondary | ICD-10-CM | POA: Diagnosis present

## 2015-02-25 MED ORDER — GADOBENATE DIMEGLUMINE 529 MG/ML IV SOLN
20.0000 mL | Freq: Once | INTRAVENOUS | Status: AC | PRN
  Administered 2015-02-25: 18 mL via INTRAVENOUS

## 2015-02-25 NOTE — Telephone Encounter (Signed)
MRI result obtained and given to MD for review with pt's number for contact as well as information per TRIAGE note.

## 2015-02-25 NOTE — Telephone Encounter (Signed)
Received call from pt requesting to talk to [place], desk nurse for [name].  Spoke with pt and was informed that pt had MRI  C/A/P  Done this am.  Pt stated she would like to know results of MRI in hope there will be some explanations to her abdominal pain. Called pt and was informed that she has pain at mid abdomen - just right under bra line- for about 2 weeks now.  Pt stated she has not had BMs in 3 days.  Pt was very careful with foods intake due to worsening of  abdominal pain.  Pt did not have any pain meds; pt stated she did not even use Ibuprofen for pain.  Pt has no problems with voiding.    Pt has office visit with [name] on 02/27/15. Message routed to [name] and Tivis Ringer, desk nurse today. Pt's phone    [phone].

## 2015-02-26 ENCOUNTER — Other Ambulatory Visit: Payer: Self-pay | Admitting: Oncology

## 2015-02-26 ENCOUNTER — Other Ambulatory Visit: Payer: Self-pay

## 2015-02-27 ENCOUNTER — Other Ambulatory Visit (HOSPITAL_BASED_OUTPATIENT_CLINIC_OR_DEPARTMENT_OTHER): Payer: 59

## 2015-02-27 ENCOUNTER — Other Ambulatory Visit: Payer: Self-pay

## 2015-02-27 ENCOUNTER — Ambulatory Visit (HOSPITAL_BASED_OUTPATIENT_CLINIC_OR_DEPARTMENT_OTHER): Payer: 59 | Admitting: Oncology

## 2015-02-27 ENCOUNTER — Ambulatory Visit: Payer: 59 | Admitting: Nurse Practitioner

## 2015-02-27 ENCOUNTER — Other Ambulatory Visit: Payer: 59

## 2015-02-27 ENCOUNTER — Ambulatory Visit: Payer: 59

## 2015-02-27 ENCOUNTER — Ambulatory Visit: Payer: 59 | Admitting: Physical Therapy

## 2015-02-27 ENCOUNTER — Encounter: Payer: Self-pay | Admitting: *Deleted

## 2015-02-27 VITALS — BP 130/77 | HR 101 | Temp 98.7°F | Resp 18 | Ht 63.0 in | Wt 189.1 lb

## 2015-02-27 DIAGNOSIS — D630 Anemia in neoplastic disease: Secondary | ICD-10-CM

## 2015-02-27 DIAGNOSIS — C787 Secondary malignant neoplasm of liver and intrahepatic bile duct: Secondary | ICD-10-CM

## 2015-02-27 DIAGNOSIS — Z8673 Personal history of transient ischemic attack (TIA), and cerebral infarction without residual deficits: Secondary | ICD-10-CM

## 2015-02-27 DIAGNOSIS — C50919 Malignant neoplasm of unspecified site of unspecified female breast: Secondary | ICD-10-CM

## 2015-02-27 DIAGNOSIS — C50312 Malignant neoplasm of lower-inner quadrant of left female breast: Secondary | ICD-10-CM

## 2015-02-27 DIAGNOSIS — G622 Polyneuropathy due to other toxic agents: Secondary | ICD-10-CM | POA: Diagnosis not present

## 2015-02-27 DIAGNOSIS — G620 Drug-induced polyneuropathy: Secondary | ICD-10-CM

## 2015-02-27 DIAGNOSIS — R519 Headache, unspecified: Secondary | ICD-10-CM

## 2015-02-27 DIAGNOSIS — T451X5A Adverse effect of antineoplastic and immunosuppressive drugs, initial encounter: Secondary | ICD-10-CM

## 2015-02-27 DIAGNOSIS — R51 Headache: Secondary | ICD-10-CM

## 2015-02-27 DIAGNOSIS — C770 Secondary and unspecified malignant neoplasm of lymph nodes of head, face and neck: Secondary | ICD-10-CM

## 2015-02-27 DIAGNOSIS — G629 Polyneuropathy, unspecified: Secondary | ICD-10-CM

## 2015-02-27 LAB — CBC WITH DIFFERENTIAL/PLATELET
BASO%: 0.3 % (ref 0.0–2.0)
Basophils Absolute: 0 10*3/uL (ref 0.0–0.1)
EOS%: 1 % (ref 0.0–7.0)
Eosinophils Absolute: 0.1 10*3/uL (ref 0.0–0.5)
HCT: 37.9 % (ref 34.8–46.6)
HGB: 12.1 g/dL (ref 11.6–15.9)
LYMPH%: 14 % (ref 14.0–49.7)
MCH: 29.8 pg (ref 25.1–34.0)
MCHC: 31.9 g/dL (ref 31.5–36.0)
MCV: 93.3 fL (ref 79.5–101.0)
MONO#: 0.8 10*3/uL (ref 0.1–0.9)
MONO%: 10.5 % (ref 0.0–14.0)
NEUT#: 5.8 10*3/uL (ref 1.5–6.5)
NEUT%: 74.2 % (ref 38.4–76.8)
Platelets: 275 10*3/uL (ref 145–400)
RBC: 4.06 10*6/uL (ref 3.70–5.45)
RDW: 14.9 % — AB (ref 11.2–14.5)
WBC: 7.8 10*3/uL (ref 3.9–10.3)
lymph#: 1.1 10*3/uL (ref 0.9–3.3)

## 2015-02-27 LAB — COMPREHENSIVE METABOLIC PANEL (CC13)
ALK PHOS: 70 U/L (ref 40–150)
ALT: 13 U/L (ref 0–55)
AST: 23 U/L (ref 5–34)
Albumin: 3.2 g/dL — ABNORMAL LOW (ref 3.5–5.0)
Anion Gap: 11 mEq/L (ref 3–11)
BILIRUBIN TOTAL: 0.58 mg/dL (ref 0.20–1.20)
BUN: 16.3 mg/dL (ref 7.0–26.0)
CO2: 29 mEq/L (ref 22–29)
Calcium: 9.4 mg/dL (ref 8.4–10.4)
Chloride: 101 mEq/L (ref 98–109)
Creatinine: 0.9 mg/dL (ref 0.6–1.1)
EGFR: 81 mL/min/{1.73_m2} — AB (ref >90)
Glucose: 90 mg/dl (ref 70–140)
Potassium: 3.8 mEq/L (ref 3.5–5.1)
SODIUM: 141 meq/L (ref 136–145)
TOTAL PROTEIN: 6.7 g/dL (ref 6.4–8.3)

## 2015-02-27 MED ORDER — IBUPROFEN 200 MG PO CAPS: 400.0000 mg | ORAL_CAPSULE | Freq: Three times a day (TID) | ORAL | Status: DC | PRN

## 2015-02-27 MED ORDER — TRAMADOL HCL 50 MG PO TABS: 50.0000 mg | ORAL_TABLET | Freq: Four times a day (QID) | ORAL | Status: DC | PRN

## 2015-02-27 NOTE — Therapy (Signed)
[place], [place], [address] Phone: [phone]   Fax:  [phone]  Physical Therapy Treatment  Patient Details  Name: Kristin Hoffman MRN: [id number] Date of Birth: [birth year] Referring Provider:  Drake Leach, MD  Encounter Date: 02/27/2015      PT End of Session - 02/27/15 1246    Visit Number 2   Number of Visits 5   Date for PT Re-Evaluation 03/22/15   PT Start Time 0935   PT Stop Time 1040   PT Time Calculation (min) 65 min   Activity Tolerance Patient tolerated treatment well   Behavior During Therapy [facility] for tasks assessed/performed      Past Medical History  Diagnosis Date  . Breast cancer   . Depression   . Reflux   . Cancer   . Hx-TIA (transient ischemic attack) 11/22/2013  . Chronic headaches 11/22/2013  . Anxiety 11/22/2013  . Breast cancer metastasized to multiple sites 12/24/2013    Past Surgical History  Procedure Laterality Date  . Mastectomy w/ nodes partial    . Mastectomy    . Breast reconstruction    . Portacath placement      x 2  . Portacath removal      x 2  . Abdominal hysterectomy    . Tee without cardioversion  11/12/2012    Procedure: TRANSESOPHAGEAL ECHOCARDIOGRAM (TEE);  Surgeon: Candee Furbish, MD;  Location: [facility] ENDOSCOPY;  Service: Cardiovascular;  Laterality: N/A;  This TEE may be Dr. Marlou Porch or a LHC Dr    There were no vitals filed for this visit.  Visit Diagnosis:  Peripheral neuropathy due to chemotherapy      Subjective Assessment - 02/27/15 0938    Symptoms Feeling better--less spacey--today compared to last week, because she's been able to sleep.  Still having pain in trunk from having violently vomited.   Pertinent History Had MRI on Wednesday and sees [name] later today; did hear from him that the liver lesion is growing.  Will see Dr. Nicola Police at [facility] next Friday about a study.   Currently in Pain? Yes   Pain Score 4   up to 9/10   Pain Location Abdomen  around mid-trunk   Aggravating Factors  eating, some positions   Pain Relieving Factors some positions                       [facility] Adult PT Treatment/Exercise - 02/27/15 0001    Modalities   Modalities Electrical Stimulation   Electrical Stimulation   Electrical Stimulation Location left volar forearm and mid-abdomen   Electrical Stimulation Action Pre-mod, 2 channel   Electrical Stimulation Parameters 80-150 Hz x 20 minutes   Electrical Stimulation Goals Pain;Other (comment)  trial to see if CIPN is helped   Manual Therapy   Manual Therapy Massage   Massage In supine with head inclined, soft tissue work to right arm from elbow to fingertips.                        [facility] Goals - 02/20/15 1140    CC Long Term Goal  #1   Title Patient will report benefit of at least one modality for decreasing symptoms of CIPN.   Time 4   Period Weeks   Status New   CC Long Term Goal  #2   Title Pt. will be independent in home management of CIPN symptoms.  Time 4   Period Weeks   Status New            Plan - 02/27/15 1246    Clinical Impression Statement Massage was done on one arm and electrical stimulation on the other to see if one or the other had a positive effect on her CIPN symptoms (numbness).   Pt will benefit from skilled therapeutic intervention in order to improve on the following deficits Impaired sensation   Rehab Potential Fair   PT Frequency 1x / week   PT Duration 4 weeks   PT Treatment/Interventions Patient/family education;Manual techniques;Electrical Stimulation   PT Next Visit Plan Patient is to note whether she feels any benefit of either treatment today; is also to try contrast baths at home.  If any benefit, may continue the  beneficial treatment and educate toward self-care.  patient is to call to let therapist know results.   PT Home Exercise Plan trial of contrast baths as per instructions    Consulted and Agree with Plan of Care Patient        Problem List Patient Active Problem List   Diagnosis Date Noted  . Heart burn 02/13/2015  . Back spasm 02/06/2015  . Chemotherapy-induced neuropathy 02/06/2015  . Drug induced neutropenia(288.03) 08/04/2014  . Breast cancer metastasized to multiple sites 12/24/2013  . Cancer of lower-inner quadrant of left female breast 12/03/2013  . Hx-TIA (transient ischemic attack) 11/22/2013  . Chronic headaches 11/22/2013  . Anxiety 11/22/2013    [place] 02/27/2015, 12:50 PM  [place], [place], [address] Phone: [phone]   Fax:  [place], PT 02/27/2015 12:50 PM

## 2015-02-27 NOTE — Progress Notes (Signed)
ID: Kristin Hoffman OB: [birth year]  MR#: [id number]  JYN#:[id number]  PCP: Drake Leach, MD GYN:  [name] SU: [name] OTHER MD: [name]  CHIEF COMPLAINT:  Stage IV breast cancer CURRENT TREATMENT: eribulin  BREAST CANCER HISTORY: This patient was previously followed by [name], and was transferred to [name] service as of 08/20/2013.  At the age of 49, the patient had a screening mammogram as a baseline. She had recently given birth and was on birth control pills at that time. The mammogram in November 2006 showed an area of architectural distortion in the left lower inner quadrant. Subsequently an ultrasound was obtained of the left breast showing a 2.0 x 1.5 x 1.4 cm mass, at the 8:00 position, 4 cm from the nipple. A core biopsy performed on 10/21/2005 showed an invasive mammary carcinoma, ER +70%, PR +41%, HER-2/neu negative, with proliferation marker of 38%. [phone])  Breast MRI on 11/08/2005 confirmed a 2.5 cm spiculated mass in the left lower inner quadrant with 3 small satellite nodules adjacent to the primary mass: 3.5 cm posterior medial to the primary mass, measuring 9 mm; 1.5 cm anterior medial to the primary mass measuring 5 mm; and 2 cm medial to the primary mass measuring 5 mm. No axillary adenopathy was noted. No suspicious masses or enhancement are noted in the right breast.  [name] underwentt left lumpectomy under the care of Dr. 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 2 of 23 lymph nodes were involved.  Margins were clear.  She received adjuvant chemotherapy consisting of 6 q. three-week doses of docetaxel/doxorubicin/cyclophosphamide given between January 2007 and may of 2007, with last dose on 04/20/2006. She underwent radiation therapy completed 07/11/2006, after which she began on tamoxifen in early August 2007.  She underwent hysterectomy  with bilateral salpingo-oophorectomy on 07/15/2008, and was started on letrozole in October of 2009.  A mammogram 11/10/2009 showed microcalcifications in the left breast, and a subsequent biopsy on 11/18/2009 confirmed invasive mammary carcinoma in the left breast. PET and CT of the chest showed no evidence of metastasis in January 2011.  She underwent bilateral mastectomies 01/25/2010, with the right breast showing no evidence of malignancy; in the left breast showing a 1.0 cm grade 2 invasive ductal carcinoma with high-grade DCIS. Tumor was ER +22%, PR negative, HER-2/neu negative, with proliferation marker of 13%. Margins were clear. Patient underwent concurrent left latissimus flap reconstruction and right implant reconstruction.  She received additional chemotherapy with one cycle of carboplatin/gemcitabine given on 03/11/2010. She then received 5 q. three-week cycles of IV CMF between 03/25/2010 and 06/17/2010.  She was started on exemestane in January 2012 and continued until late November 2013 when she was hospitalized for apparent TIA.   Her subsequent history is as detailed below  INTERVAL HISTORY: [name] returns today for followup of her metastatic breast cancer, accompanied by a friend. Last week the start of cycle 3 was held because of persistent neuropathy. Unfortunately, even with the week off the numbness is no better, but certainly no worse. Her heartburn is "back with a vengeance" despite improving last week. She has tried prilosec, tums, and reglan at this point. She even stopped the vitamin C supplements. She took the flexeril this past week for her back spasms, and it didn't do anything to lessen them.   REVIEW OF SYSTEMS: [name] denies fevers, chills, nausea, vomiting, or changes in bowel or  bladder habits. Her thrush has improved with the use of diflucan. Her appetite and energy level are fair. She sleeps well at night. She denies shortness of breath, chest pain ,cough, or  palpitations. She has no headaches, dizziness, unexplained weight loss, or night sweats. A detailed review of systems is otherwise stable.   PAST MEDICAL HISTORY: Past Medical History  Diagnosis Date  . Breast cancer   . Depression   . Reflux   . Cancer   . Hx-TIA (transient ischemic attack) 11/22/2013  . Chronic headaches 11/22/2013  . Anxiety 11/22/2013  . Breast cancer metastasized to multiple sites 12/24/2013    PAST SURGICAL HISTORY: Past Surgical History  Procedure Laterality Date  . Mastectomy w/ nodes partial    . Mastectomy    . Breast reconstruction    . Portacath placement      x 2  . Portacath removal      x 2  . Abdominal hysterectomy    . Tee without cardioversion  11/12/2012    Procedure: TRANSESOPHAGEAL ECHOCARDIOGRAM (TEE);  Surgeon: Candee Furbish, MD;  Location: [facility] ENDOSCOPY;  Service: Cardiovascular;  Laterality: N/A;  This TEE may be Dr. Marlou Porch or a LHC Dr    FAMILY HISTORY Both parents are alive and well. Patient has one sister who is 10 years younger and is in good health. No other history of breast or ovarian cancer in the family. No family history on file.  GYNECOLOGIC HISTORY:   (Updated January 2015) G2P2, menarche at age 49 with irregular menses. On birth control pills in the past. On Clomid to induce ovulation with first pregnancy. Also had preeclampsia with first pregnancy. Status post hysterectomy and bilateral salpingo-oophorectomy in August 2009.  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 She is originally from [place]. She's been married to [place], for 13 years. He works as an Pharmacist, hospital at [employer].   ADVANCED DIRECTIVES:    HEALTH MAINTENANCE:  (Updated 11/22/2013) History  Substance Use Topics  . Smoking status: Never Smoker   . Smokeless tobacco: Never Used  . Alcohol Use: Yes     Comment: rarely     Colonoscopy:  Never  PAP: S/P  LAVH/BSO in August 2009  Bone density: Never  Lipid panel: Not on file   Allergies  Allergen Reactions  . Afinitor [Everolimus] Hives    Current Outpatient Prescriptions  Medication Sig Dispense Refill  . acidophilus (RISAQUAD) CAPS capsule Take 1 capsule by mouth daily.    Marland Kitchen aspirin 81 MG tablet Take 81 mg by mouth daily.    . cetirizine (ZYRTEC) 10 MG tablet Take 10 mg by mouth daily. Pt takes this med at night    . Cholecalciferol 2000 UNITS CHEW Chew 1 tablet by mouth daily.    . cyclobenzaprine (FLEXERIL) 5 MG tablet Take 1 tablet (5 mg total) by mouth 3 (three) times daily as needed for muscle spasms. (Patient not taking: Reported on 02/20/2015) 30 tablet 1  . dexamethasone (DECADRON) 4 MG tablet Take 2 tablets (8 mg total) by mouth 2 (two) times daily with a meal. Start the day after chemotherapy for 2 days. Take with food. (Patient not taking: Reported on 02/20/2015) 30 tablet 1  . escitalopram (LEXAPRO) 20 MG tablet Take 20 mg by mouth daily with breakfast.     . fluconazole (DIFLUCAN) 100 MG tablet Take 1 tablet (100 mg total) by mouth  daily. (Patient not taking: Reported on 02/20/2015) 20 tablet 12  . lidocaine-prilocaine (EMLA) cream Apply to affected area once (Patient not taking: Reported on 02/20/2015) 30 g 3  . LORazepam (ATIVAN) 0.5 MG tablet TAKE 1 TABLET BY MOUTH AS NEEDED FOR NAUSEA AND VOMITING 30 tablet 0  . Melatonin 10 MG TABS Take 1 tablet by mouth at bedtime.    . metoCLOPramide (REGLAN) 5 MG tablet Take 5 mg by mouth 4 (four) times daily.    . montelukast (SINGULAIR) 10 MG tablet Take 10 mg by mouth daily. Pt takes this med in the morning.    . Multiple Vitamin (MULTIVITAMIN) tablet Take 1 tablet by mouth daily.    Marland Kitchen omeprazole (PRILOSEC) 40 MG capsule Take 40 mg by mouth daily.     . ondansetron (ZOFRAN) 8 MG tablet Take 1 tablet (8 mg total) by mouth 2 (two) times daily. Start the day after chemo for 2 days. Then take as needed for nausea or vomiting. (Patient not  taking: Reported on 02/20/2015) 30 tablet 1  . OVER THE COUNTER MEDICATION Take 2 capsules by mouth 3 (three) times daily.    Marland Kitchen OVER THE COUNTER MEDICATION Take 2 drops by mouth 2 (two) times daily.    Marland Kitchen OVER THE COUNTER MEDICATION Take 1 drop by mouth daily as needed (FOR ACID REFLUX).    Marland Kitchen prochlorperazine (COMPAZINE) 10 MG tablet Take 1 tablet (10 mg total) by mouth every 6 (six) hours as needed (Nausea or vomiting). (Patient not taking: Reported on 02/06/2015) 30 tablet 1  . sucralfate (CARAFATE) 1 G tablet Take 1 tablet (1 g total) by mouth 4 (four) times daily -  with meals and at bedtime. 60 tablet 2   No current facility-administered medications for this visit.    OBJECTIVE: Young white woman in no acute distress There were no vitals filed for this visit.There is no weight on file to calculate BMI.  ECOG: 1 There were no vitals filed for this visit. Skin: warm, dry  HEENT: sclerae anicteric, conjunctivae pink, oropharynx clear. No thrush or mucositis.  Lymph Nodes: No cervical or supraclavicular lymphadenopathy  Lungs: clear to auscultation bilaterally, no rales, wheezes, or rhonci  Heart: regular rate and rhythm  Abdomen: round, soft, non tender, positive bowel sounds  Musculoskeletal: No focal spinal tenderness, no peripheral edema  Neuro: non focal, well oriented, positive affect  Breasts: deferred  LAB RESULTS:   Lab Results  Component Value Date   WBC 6.9 02/13/2015   NEUTROABS 5.0 02/13/2015   HGB 12.3 02/13/2015   HCT 38.6 02/13/2015   MCV 95.3 02/13/2015   PLT 322 02/13/2015      Chemistry      Component Value Date/Time   NA 142 02/13/2015 1445   NA 142 11/12/2012 0735   K 3.5 02/13/2015 1445   K 3.8 11/12/2012 0735   CL 104 11/14/2012 1407   CL 104 11/12/2012 0735   CO2 26 02/13/2015 1445   CO2 30 11/12/2012 0735   BUN 16.0 02/13/2015 1445   BUN 13 11/12/2012 0735   CREATININE 0.9 02/13/2015 1445   CREATININE 0.97 11/12/2012 0735      Component Value  Date/Time   CALCIUM 9.7 02/13/2015 1445   CALCIUM 9.1 11/12/2012 0735   ALKPHOS 67 02/13/2015 1445   ALKPHOS 69 11/12/2012 0735   AST 21 02/13/2015 1445   AST 16 11/12/2012 0735   ALT 14 02/13/2015 1445   ALT 13 11/12/2012 0735   BILITOT 0.39 02/13/2015  1445   BILITOT 0.9 11/12/2012 0735      STUDIES: Mr Liver W Wo Contrast  2015-03-02   CLINICAL DATA:  History of breast cancer with hepatic metastatic disease. Follow up response to interval therapy. Subsequent encounter.  EXAM: MRI ABDOMEN WITHOUT AND WITH CONTRAST  TECHNIQUE: Multiplanar multisequence MR imaging of the abdomen was performed both before and after the administration of intravenous contrast.  CONTRAST:  10m MULTIHANCE GADOBENATE DIMEGLUMINE 529 MG/ML IV SOLN  COMPARISON:  PET-CT 12/08/2014. Abdominal MRI 03/20/2014 and 12/08/2014.  FINDINGS: Lower chest: Bilateral breast implants noted. Otherwise unremarkable.  Hepatobiliary: Multiple enhancing hepatic metastases are again demonstrated. These lesions are best seen on the axial T2 FSE (series 4) and the postcontrast images. As measured on series 4, the dominant lesion in the dome of the right hepatic lobe measures 4.2 x 2.5 cm on image number 9 (3.1 x 1.8 cm previously). This lesion demonstrates incomplete enhancement following contrast consistent with partial necrosis. Multiple other enlarging smaller lesions are seen, including an 8 mm lesion on image 11. Most of the lesions are in the right lobe, although there are several left lobe lesions as well. Underlying hepatic steatosis noted. No evidence of gallstones, gallbladder wall thickening or biliary dilatation.  Pancreas: There is a complex peripherally enhancing cystic mass within the pancreatic head, measuring 2.5 x 3.0 cm transverse. There is another smaller peripherally enhancing lesion posterior to the portal vein measuring 7 mm on image 47 of series 1103. This is probably within pancreatic tissue as well. There is no  pancreatic ductal dilatation or surrounding inflammatory change.  Spleen: Normal in size without focal abnormality.  Adrenals/Urinary Tract: Both adrenal glands appear normal.Tiny renal cysts are noted. No evidence of enhancing renal mass or hydronephrosis.  Stomach/Bowel: No evidence of bowel wall thickening, distention or surrounding inflammatory change.  Vascular/Lymphatic: There are no enlarged abdominal lymph nodes. No significant vascular findings are present.  Other: No evidence of ascites or peritoneal nodularity.  Musculoskeletal: No acute or significant osseous findings.  IMPRESSION: 1. Further progression of hepatic metastatic disease. 2. New peripherally enhancing cystic lesions within the pancreas, likely necrotic metastases. These are less likely to be postinflammatory in etiology. 3. No adenopathy or osseous metastases identified.   Electronically Signed   By: WRichardean SaleM.D.   On: 02016/02/2110:16    ASSESSMENT: 49y.o. [place] woman  (1)  status post left lumpectomy under the care of Dr. SMargot Chimeson 11/18/2005 for a 2.5 cm grade 3 invasive ductal carcinoma, ER +70%, PR +41%, HER-2/neu negative, with proliferation marker of 38%. 2 of 23 lymph nodes were involved.  Margins were clear.  (2) status post adjuvant chemotherapy consisting of 6 q. three-week doses of docetaxel/doxorubicin/cyclophosphamide given between January 2007 and may of 2007, with last dose on 04/20/2006.  (3) status post radiation therapy to the left breast, completed 07/11/2006  (4) began on tamoxifen in early August 2007 and continued until October 2009. She status post hysterectomy with bilateral salpingo-oophorectomy on 07/15/2008, and was started on letrozole in October of 2009.  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 PET and CT of the chest showed no evidence of metastasis in January  2011.  (6) status post bilateral mastectomies 01/25/2010, with the right breast showing no evidence of malignancy; in the left breast showing a 1.0 cm grade 2 invasive ductal carcinoma,  ER +22%, PR negative, HER-2/neu negative, with  proliferation marker of 13%. Margins were clear. Patient underwent concurrent left latissimus flap reconstruction and right implant reconstruction.  (7)  status post additional chemotherapy with one cycle of carboplatin/gemcitabine given on 03/11/2010. She then received 5 q. three-week cycles of IV CMF between 03/25/2010 and 06/17/2010.  (8) started on exemestane in January 2012 and continued until late November 2013 when she was hospitalized for apparent TIA at which time her exemestane was stopped and not resumed  (9) METASTATIC DISEASE: evidence of disease recurence noted on chest CT, liver MRI and PET scan in December 2014, with suspicious lung nodules, lymphadenopathy, and 3 lesions in the right lobe of the liver, but no evidence of bony disease and no evidence of brain involvement by brain MRI September 2014. Biopsy of a left supraclavicular lymph node on 12/11/2013 confirmed metastatic carcinoma, estrogen receptor 45% positive, progesterone receptor 17% positive, with no HER-2 amplification.  (10) fulvestrant started 12/03/2013, discontinued 03/25/2014 with progression  (11) exemestane/ everolimus started 04/04/2014; everolimus held 04/14/2014, with skin rash and mouth soreness; resumed at 5 mg a day as of 04/29/2014, discontinued 05/09/2014 with similar symptoms  (12) continueing exemestane, adding palbociclib 05/26/2014;   (a) palbociclib dose dropped to 75 mg every other day for 21 days beginning with cycle 4  (b) exemestane and Palbociclib discontinued December 2015 with evidence of progression  (13) [facility] referral placed 04/28/2049 -- re-sent 05/23/2014-- shows Pi3KCA and TP53 mutations  (14) [facility] one test requested 12/10/2014-- confirms [facility]  results  (15) started eribulin 12/26/2014, stopped 01/23/2015 after 2 cycles because of neuropathy; repeat liver MRI 02/25/2015 shows progression    PLAN:  We spent approximately 45 minutes going over Silas's's situation today. Of course we had to stop the eribulin because of neuropathy issues, but the repeat MRI just obtained shows progression. She did receive 3 doses and I would conclude on that basis that this agent likely would not be effective.  There are many agents we have not tried including liposomal doxorubicin, nab-paclitaxel, capecitabine and ixabepilone. Gemzar also could be tried. Immediately ahead though is her consideration of the [facility] trial using capecitabine and a Pi3K inhibitor. She will be evaluated for this 03/06/2015.   Before that, on 03/03/2015 she will have a spine MRI to make sure we do not have an urgency related to her continuing back pain. She is scheduled to see [place] 03/24, but we will cancel that if the MRI is unremarkable.  Today we also discussed supportive measures. She is not eating because when she eats she gets increased discomfort. She was relieved to learn that her gallbladder is not involved. I urged her to take Advil 400 mg 3 times a day with food, and then tramadol 1 or 2 tablets up to 4 times a day, as needed. She is using omeprazole for the gastritis and she is already on low-dose Reglan around the clock for nausea. She can increase the Reglan to 10 mg 4 times a day as needed. She may become constipated from the tramadol and we discussed her use of marrow lacks, stool softeners, and laxatives as needed.  Ainara understands and agrees with this plan. She knows the goal of treatment in her case is control. She will call with any problems that may develop before her next visit here, which will be in approximately 1 month.    Chauncey Cruel, MD   02/27/2015 3:11 PM

## 2015-03-02 ENCOUNTER — Telehealth: Payer: Self-pay | Admitting: Oncology

## 2015-03-02 NOTE — Telephone Encounter (Signed)
Spoke with patient and she is aware of her 4/29 appointments

## 2015-03-03 ENCOUNTER — Ambulatory Visit (HOSPITAL_COMMUNITY)
Admission: RE | Admit: 2015-03-03 | Discharge: 2015-03-03 | Disposition: A | Discharge status: Home / Self Care | Payer: 59 | Source: Ambulatory Visit | Attending: Nurse Practitioner | Admitting: Nurse Practitioner

## 2015-03-03 ENCOUNTER — Other Ambulatory Visit: Payer: Self-pay | Admitting: Nurse Practitioner

## 2015-03-03 DIAGNOSIS — M62830 Muscle spasm of back: Secondary | ICD-10-CM | POA: Insufficient documentation

## 2015-03-03 DIAGNOSIS — C799 Secondary malignant neoplasm of unspecified site: Secondary | ICD-10-CM | POA: Diagnosis not present

## 2015-03-03 DIAGNOSIS — C50919 Malignant neoplasm of unspecified site of unspecified female breast: Secondary | ICD-10-CM

## 2015-03-03 MED ORDER — GADOBENATE DIMEGLUMINE 529 MG/ML IV SOLN
20.0000 mL | Freq: Once | INTRAVENOUS | Status: AC | PRN
  Administered 2015-03-03: 18 mL via INTRAVENOUS

## 2015-03-05 ENCOUNTER — Telehealth: Payer: Self-pay | Admitting: *Deleted

## 2015-03-05 ENCOUNTER — Ambulatory Visit: Payer: Self-pay | Admitting: Nurse Practitioner

## 2015-03-05 ENCOUNTER — Other Ambulatory Visit: Payer: Self-pay

## 2015-03-05 ENCOUNTER — Other Ambulatory Visit: Payer: 59

## 2015-03-05 NOTE — Telephone Encounter (Signed)
Per [name], I informed patient that MRI of spine and liver were normal. Patient verbalized understanding.

## 2015-03-06 ENCOUNTER — Other Ambulatory Visit: Payer: 59

## 2015-03-06 ENCOUNTER — Ambulatory Visit: Payer: 59 | Admitting: Nurse Practitioner

## 2015-03-25 ENCOUNTER — Other Ambulatory Visit: Payer: Self-pay | Admitting: Oncology

## 2015-03-26 ENCOUNTER — Other Ambulatory Visit: Payer: Self-pay | Admitting: *Deleted

## 2015-04-08 ENCOUNTER — Telehealth: Payer: Self-pay

## 2015-04-08 ENCOUNTER — Encounter: Payer: Self-pay | Admitting: Medical Oncology

## 2015-04-08 ENCOUNTER — Telehealth: Payer: Self-pay | Admitting: Medical Oncology

## 2015-04-08 NOTE — Telephone Encounter (Signed)
I left another voice mail for pt to come in at 1530 or 1600 for appt.

## 2015-04-08 NOTE — Telephone Encounter (Signed)
I called pt back and asked her to return a call to triage.

## 2015-04-08 NOTE — Telephone Encounter (Signed)
erroneous

## 2015-04-08 NOTE — Telephone Encounter (Signed)
Pt returned Diane's call. She is unable to get to [facility] today, appt set up for [place] tomorrow. Pt is on her week off of capecitabine. She has been having diarrhea since Friday. She was trying to manage with immodium AD b/c it usually works for her. She has decreased appetite b/c it goes right through her. She had one bloody stool yesterday. Her throat and mouth are also sore. She does not feel like driving so has a ride available for tomorrow am.

## 2015-04-09 ENCOUNTER — Ambulatory Visit (HOSPITAL_BASED_OUTPATIENT_CLINIC_OR_DEPARTMENT_OTHER): Payer: 59

## 2015-04-09 ENCOUNTER — Ambulatory Visit (HOSPITAL_BASED_OUTPATIENT_CLINIC_OR_DEPARTMENT_OTHER): Payer: 59 | Admitting: Nurse Practitioner

## 2015-04-09 ENCOUNTER — Other Ambulatory Visit: Payer: Self-pay | Admitting: Oncology

## 2015-04-09 VITALS — BP 129/85 | HR 106 | Temp 98.1°F | Resp 18 | Wt 184.8 lb

## 2015-04-09 DIAGNOSIS — R630 Anorexia: Secondary | ICD-10-CM | POA: Insufficient documentation

## 2015-04-09 DIAGNOSIS — G620 Drug-induced polyneuropathy: Secondary | ICD-10-CM

## 2015-04-09 DIAGNOSIS — R197 Diarrhea, unspecified: Secondary | ICD-10-CM

## 2015-04-09 DIAGNOSIS — R530 Neoplastic (malignant) related fatigue: Secondary | ICD-10-CM

## 2015-04-09 DIAGNOSIS — R51 Headache: Secondary | ICD-10-CM

## 2015-04-09 DIAGNOSIS — Z95828 Presence of other vascular implants and grafts: Secondary | ICD-10-CM

## 2015-04-09 DIAGNOSIS — Z8673 Personal history of transient ischemic attack (TIA), and cerebral infarction without residual deficits: Secondary | ICD-10-CM

## 2015-04-09 DIAGNOSIS — R519 Headache, unspecified: Secondary | ICD-10-CM

## 2015-04-09 DIAGNOSIS — C50312 Malignant neoplasm of lower-inner quadrant of left female breast: Secondary | ICD-10-CM | POA: Diagnosis not present

## 2015-04-09 DIAGNOSIS — R112 Nausea with vomiting, unspecified: Secondary | ICD-10-CM

## 2015-04-09 DIAGNOSIS — K1231 Oral mucositis (ulcerative) due to antineoplastic therapy: Secondary | ICD-10-CM | POA: Insufficient documentation

## 2015-04-09 DIAGNOSIS — C50919 Malignant neoplasm of unspecified site of unspecified female breast: Secondary | ICD-10-CM

## 2015-04-09 DIAGNOSIS — T451X5A Adverse effect of antineoplastic and immunosuppressive drugs, initial encounter: Principal | ICD-10-CM

## 2015-04-09 DIAGNOSIS — E860 Dehydration: Secondary | ICD-10-CM | POA: Diagnosis not present

## 2015-04-09 LAB — CBC WITH DIFFERENTIAL/PLATELET
BASO%: 0.2 % (ref 0.0–2.0)
Basophils Absolute: 0 10*3/uL (ref 0.0–0.1)
EOS%: 2.5 % (ref 0.0–7.0)
Eosinophils Absolute: 0.1 10*3/uL (ref 0.0–0.5)
HCT: 40.3 % (ref 34.8–46.6)
HGB: 13.1 g/dL (ref 11.6–15.9)
LYMPH%: 16.4 % (ref 14.0–49.7)
MCH: 29.4 pg (ref 25.1–34.0)
MCHC: 32.5 g/dL (ref 31.5–36.0)
MCV: 90.4 fL (ref 79.5–101.0)
MONO#: 0.3 10*3/uL (ref 0.1–0.9)
MONO%: 5.9 % (ref 0.0–14.0)
NEUT#: 3.7 10*3/uL (ref 1.5–6.5)
NEUT%: 75 % (ref 38.4–76.8)
Platelets: 241 10*3/uL (ref 145–400)
RBC: 4.46 10*6/uL (ref 3.70–5.45)
RDW: 15.8 % — AB (ref 11.2–14.5)
WBC: 4.9 10*3/uL (ref 3.9–10.3)
lymph#: 0.8 10*3/uL — ABNORMAL LOW (ref 0.9–3.3)

## 2015-04-09 LAB — COMPREHENSIVE METABOLIC PANEL (CC13)
ALT: 16 U/L (ref 0–55)
AST: 32 U/L (ref 5–34)
Albumin: 3.8 g/dL (ref 3.5–5.0)
Alkaline Phosphatase: 92 U/L (ref 40–150)
Anion Gap: 12 mEq/L — ABNORMAL HIGH (ref 3–11)
BUN: 11 mg/dL (ref 7.0–26.0)
CALCIUM: 9.3 mg/dL (ref 8.4–10.4)
CO2: 24 meq/L (ref 22–29)
Chloride: 105 mEq/L (ref 98–109)
Creatinine: 0.9 mg/dL (ref 0.6–1.1)
EGFR: 76 mL/min/{1.73_m2} — ABNORMAL LOW (ref >90)
GLUCOSE: 100 mg/dL (ref 70–140)
POTASSIUM: 3.6 meq/L (ref 3.5–5.1)
SODIUM: 142 meq/L (ref 136–145)
Total Bilirubin: 1.06 mg/dL (ref 0.20–1.20)
Total Protein: 6.4 g/dL (ref 6.4–8.3)

## 2015-04-09 MED ORDER — SODIUM CHLORIDE 0.9 % IV SOLN
INTRAVENOUS | Status: AC
  Administered 2015-04-09: 11:00:00 via INTRAVENOUS

## 2015-04-09 MED ORDER — DIPHENOXYLATE-ATROPINE 2.5-0.025 MG PO TABS: 2.0000 | ORAL_TABLET | Freq: Four times a day (QID) | ORAL | Status: DC | PRN

## 2015-04-09 MED ORDER — SODIUM CHLORIDE 0.9 % IV SOLN
Freq: Once | INTRAVENOUS | Status: AC
  Administered 2015-04-09: 11:00:00 via INTRAVENOUS
  Filled 2015-04-09: qty 4

## 2015-04-09 MED ORDER — HEPARIN SOD (PORK) LOCK FLUSH 100 UNIT/ML IV SOLN
500.0000 [IU] | Freq: Once | INTRAVENOUS | Status: AC
  Administered 2015-04-09: 500 [IU] via INTRAVENOUS
  Filled 2015-04-09: qty 5

## 2015-04-09 MED ORDER — SODIUM CHLORIDE 0.9 % IJ SOLN
10.0000 mL | INTRAMUSCULAR | Status: DC | PRN
  Administered 2015-04-09: 10 mL via INTRAVENOUS
  Filled 2015-04-09: qty 10

## 2015-04-10 ENCOUNTER — Ambulatory Visit (HOSPITAL_BASED_OUTPATIENT_CLINIC_OR_DEPARTMENT_OTHER): Payer: 59 | Admitting: Oncology

## 2015-04-10 ENCOUNTER — Other Ambulatory Visit: Payer: Self-pay | Admitting: *Deleted

## 2015-04-10 ENCOUNTER — Telehealth: Payer: Self-pay | Admitting: Oncology

## 2015-04-10 ENCOUNTER — Other Ambulatory Visit (HOSPITAL_COMMUNITY)
Admission: RE | Admit: 2015-04-10 | Discharge: 2015-04-10 | Disposition: A | Discharge status: Home / Self Care | Payer: 59 | Source: Ambulatory Visit | Attending: Oncology | Admitting: Oncology

## 2015-04-10 ENCOUNTER — Other Ambulatory Visit (HOSPITAL_BASED_OUTPATIENT_CLINIC_OR_DEPARTMENT_OTHER): Payer: 59

## 2015-04-10 ENCOUNTER — Other Ambulatory Visit: Payer: 59

## 2015-04-10 ENCOUNTER — Telehealth: Payer: Self-pay | Admitting: *Deleted

## 2015-04-10 VITALS — BP 140/85 | HR 88 | Temp 99.2°F | Resp 18 | Ht 63.0 in | Wt 185.8 lb

## 2015-04-10 DIAGNOSIS — C50312 Malignant neoplasm of lower-inner quadrant of left female breast: Secondary | ICD-10-CM | POA: Diagnosis not present

## 2015-04-10 DIAGNOSIS — C50919 Malignant neoplasm of unspecified site of unspecified female breast: Secondary | ICD-10-CM | POA: Diagnosis present

## 2015-04-10 DIAGNOSIS — R197 Diarrhea, unspecified: Secondary | ICD-10-CM | POA: Diagnosis not present

## 2015-04-10 DIAGNOSIS — G620 Drug-induced polyneuropathy: Secondary | ICD-10-CM | POA: Diagnosis not present

## 2015-04-10 DIAGNOSIS — F329 Major depressive disorder, single episode, unspecified: Secondary | ICD-10-CM

## 2015-04-10 DIAGNOSIS — R5383 Other fatigue: Secondary | ICD-10-CM

## 2015-04-10 DIAGNOSIS — Z8673 Personal history of transient ischemic attack (TIA), and cerebral infarction without residual deficits: Secondary | ICD-10-CM

## 2015-04-10 DIAGNOSIS — D630 Anemia in neoplastic disease: Secondary | ICD-10-CM

## 2015-04-10 DIAGNOSIS — T451X5A Adverse effect of antineoplastic and immunosuppressive drugs, initial encounter: Secondary | ICD-10-CM

## 2015-04-10 LAB — URINALYSIS, MICROSCOPIC - CHCC
BLOOD: NEGATIVE
Bilirubin (Urine): NEGATIVE
Glucose: NEGATIVE mg/dL
KETONES: NEGATIVE mg/dL
NITRITE: NEGATIVE
PH: 5 (ref 4.6–8.0)
Protein: NEGATIVE mg/dL
RBC / HPF: NEGATIVE (ref 0–2)
Specific Gravity, Urine: 1.005 (ref 1.003–1.035)
UROBILINOGEN UR: 0.2 mg/dL (ref 0.2–1)

## 2015-04-10 LAB — CLOSTRIDIUM DIFFICILE BY PCR: Toxigenic C. Difficile by PCR: NEGATIVE

## 2015-04-10 MED ORDER — NYSTATIN 100000 UNIT/ML MT SUSP: 5.0000 mL | Freq: Four times a day (QID) | OROMUCOSAL | Status: DC

## 2015-04-10 MED ORDER — SODIUM CHLORIDE 0.9 % IV SOLN
INTRAVENOUS | Status: DC
  Administered 2015-04-10: 14:00:00 via INTRAVENOUS

## 2015-04-10 MED ORDER — CHOLESTYRAMINE 4 G PO PACK: 4.0000 g | PACK | Freq: Three times a day (TID) | ORAL | Status: DC

## 2015-04-10 NOTE — Addendum Note (Signed)
Addended by: Chauncey Cruel on: 04/10/2015 03:49 PM   Modules accepted: Orders

## 2015-04-10 NOTE — Telephone Encounter (Signed)
NO ENTRY 

## 2015-04-10 NOTE — Telephone Encounter (Signed)
per pof ot sch pt appt-melissa added IVF-gave pt copy of sch-adv GM no orders in for CT C/A/P-pt stated will get contrast at next appt

## 2015-04-10 NOTE — Progress Notes (Signed)
ID: Kristin Hoffman OB: 1966-11-03  MR#: [id number]  FOY#:[id number]  PCP: Drake Leach, MD GYN:  [name] SU: [name] OTHER MD: [name], [name]  CHIEF COMPLAINT:  Stage IV breast cancer  CURRENT TREATMENT: eribulin  BREAST CANCER HISTORY: This patient was previously followed by [name], and was transferred to [name] service as of 08/20/2013.  At the age of 16, the patient had a screening mammogram as a baseline. She had recently given birth and was on birth control pills at that time. The mammogram in November 2006 showed an area of architectural distortion in the left lower inner quadrant. Subsequently an ultrasound was obtained of the left breast showing a 2.0 x 1.5 x 1.4 cm mass, at the 8:00 position, 4 cm from the nipple. A core biopsy performed on 10/21/2005 showed an invasive mammary carcinoma, ER +70%, PR +41%, HER-2/neu negative, with proliferation marker of 38%. [phone])  Breast MRI on 11/08/2005 confirmed a 2.5 cm spiculated mass in the left lower inner quadrant with 3 small satellite nodules adjacent to the primary mass: 3.5 cm posterior medial to the primary mass, measuring 9 mm; 1.5 cm anterior medial to the primary mass measuring 5 mm; and 2 cm medial to the primary mass measuring 5 mm. No axillary adenopathy was noted. No suspicious masses or enhancement are noted in the right breast.  Kristin Hoffman underwentt left lumpectomy under the care of Dr. 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 2 of 23 lymph nodes were involved.  Margins were clear.  She received adjuvant chemotherapy consisting of 6 q. three-week doses of docetaxel/doxorubicin/cyclophosphamide given between January 2007 and may of 2007, with last dose on 04/20/2006. She underwent radiation therapy completed 07/11/2006, after which she began on tamoxifen in early August 2007.  She  underwent hysterectomy with bilateral salpingo-oophorectomy on 07/15/2008, and was started on letrozole in October of 2009.  A mammogram 11/10/2009 showed microcalcifications in the left breast, and a subsequent biopsy on 11/18/2009 confirmed invasive mammary carcinoma in the left breast. PET and CT of the chest showed no evidence of metastasis in January 2011.  She underwent bilateral mastectomies 01/25/2010, with the right breast showing no evidence of malignancy; in the left breast showing a 1.0 cm grade 2 invasive ductal carcinoma with high-grade DCIS. Tumor was ER +22%, PR negative, HER-2/neu negative, with proliferation marker of 13%. Margins were clear. Patient underwent concurrent left latissimus flap reconstruction and right implant reconstruction.  She received additional chemotherapy with one cycle of carboplatin/gemcitabine given on 03/11/2010. She then received 5 q. three-week cycles of IV CMF between 03/25/2010 and 06/17/2010.  She was started on exemestane in January 2012 and continued until late November 2013 when she was hospitalized for apparent TIA.   Her subsequent history is as detailed below  INTERVAL HISTORY: Kristin Hoffman returns today for followup of her metastatic breast cancer. Since her last visit here she was evaluated at [facility] for the capecitabine/ BYL719 trial. She turned out not to be eligible because of an elevated lipase. A pancreatic mass was noted on scan at [facility] 03/23/2015. Dr. Albertina Parr recommended capecitabine (which Tajanay started 03/23/2015). Incidentally they also obtained an echocardiogram 03/23/2015 which was normal.  REVIEW OF SYSTEMS: Kristin Hoffman "f  [employer] actually with this doesn't feel good". She is very tired. She can't get anything done. She hates to have to depend on other people. 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 She continues to have diarrhea. She has been taking Imodium and yesterday  when she was seen by our in the was started on Lomotil. This has not helped so far. She is crying all the time. She wonders if this is because she had to go off Lexapro because of interactions with the study drug in the [facility] study. (There are no interactions between Lexapro and capecitabine). She was started on Zoloft. She doesn't think it's working. She has had some congestion, with sinus problems, mild epistaxis, difficulty swallowing, but no cough. She has some abdominal pain which is well-controlled on Ultram. Her appetite is poor. She also has had some burning with urination. A detailed review of systems today was otherwise stable.   L HISTORY: Past Medical History  Diagnosis Date  . Breast cancer   . Depression   . Reflux   . Cancer   . Hx-TIA (transient ischemic attack) 11/22/2013  . Chronic headaches 11/22/2013  . Anxiety 11/22/2013  . Breast cancer metastasized to multiple sites 12/24/2013    PAST SURGICAL HISTORY: Past Surgical History  Procedure Laterality Date  . Mastectomy w/ nodes partial    . Mastectomy    . Breast reconstruction    . Portacath placement      x 2  . Portacath removal      x 2  . Abdominal hysterectomy    . Tee without cardioversion  11/12/2012    Procedure: TRANSESOPHAGEAL ECHOCARDIOGRAM (TEE);  Surgeon: Candee Furbish, MD;  Location: [facility] ENDOSCOPY;  Service: Cardiovascular;  Laterality: N/A;  This TEE may be Dr. Marlou Porch or a LHC Dr    FAMILY HISTORY Both parents are alive and well. Patient has one sister who is 52 years younger and is in good health. No other history of breast or ovarian cancer in the family. No family history on file.  GYNECOLOGIC HISTORY:   (Updated January 2015) G2P2, menarche at age 82 with irregular menses. On birth control pills in the past. On Clomid to induce ovulation with first pregnancy. Also had preeclampsia with first pregnancy. Status post hysterectomy and bilateral salpingo-oophorectomy in August 2009.  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 She is originally from [place]. She's been married to [place], for 13 years. He works as an Pharmacist, hospital at [employer].   ADVANCED DIRECTIVES:  Not in place; the patient's husband is her HCPOA  HEALTH MAINTENANCE:  (Updated 11/22/2013) History  Substance Use Topics  . Smoking status: Never Smoker   . Smokeless tobacco: Never Used  . Alcohol Use: Yes     Comment: rarely     Colonoscopy:  Never  PAP: S/P LAVH/BSO in August 2009  Bone density: Never  Lipid panel: Not on file   Allergies  Allergen Reactions  . Afinitor [Everolimus] Hives    Current Outpatient Prescriptions  Medication Sig Dispense Refill  . acidophilus (RISAQUAD) CAPS capsule Take 1 capsule by mouth daily.    Marland Kitchen aspirin 81 MG tablet Take 81 mg by mouth daily.    . Biotin 5000 MCG CAPS Take 1 capsule (5,000 mcg total) by mouth daily. 30 capsule   . cetirizine (ZYRTEC) 10 MG tablet Take 10 mg by mouth daily. Pt takes this med at night    . Cholecalciferol 2000 UNITS CHEW Chew 1  tablet by mouth daily.    . cholestyramine (QUESTRAN) 4 G packet Take 1 packet (4 g total) by mouth 3 (three) times daily with meals. 60 each 12  . diphenoxylate-atropine (LOMOTIL) 2.5-0.025 MG per tablet Take 2 tablets by mouth 4 (four) times daily as needed for diarrhea or loose stools. 30 tablet 0  . escitalopram (LEXAPRO) 20 MG tablet Take 20 mg by mouth daily with breakfast.     . Ibuprofen (ADVIL) 200 MG CAPS Take 2 capsules (400 mg total) by mouth 3 (three) times daily with meals as needed. 120 each 0  . lidocaine-prilocaine (EMLA) cream Apply to affected area once (Patient not taking: Reported on 02/20/2015) 30 g 3  . LORazepam (ATIVAN) 0.5 MG tablet TAKE 1 TABLTE BY MOUTH AS NEEDED FOR AUSEA AND VOMITING 30 tablet 0  . Melatonin 10 MG TABS Take 1 tablet by mouth at bedtime.    . metoCLOPramide (REGLAN) 5  MG tablet Take 10 mg by mouth 4 (four) times daily.    . montelukast (SINGULAIR) 10 MG tablet Take 10 mg by mouth daily. Pt takes this med in the morning.    . Multiple Vitamin (MULTIVITAMIN) tablet Take 1 tablet by mouth daily.    Marland Kitchen nystatin (MYCOSTATIN) 100000 UNIT/ML suspension Take 5 mLs (500,000 Units total) by mouth 4 (four) times daily. 240 mL 1  . omeprazole (PRILOSEC) 40 MG capsule Take 40 mg by mouth daily.     . traMADol (ULTRAM) 50 MG tablet Take 1 tablet (50 mg total) by mouth every 6 (six) hours as needed. 30 tablet 3   No current facility-administered medications for this visit.    OBJECTIVE: Young white woman who was tearful during today's visit Filed Vitals:   04/10/15 1256  BP: 140/85  Pulse: 88  Temp: 99.2 F (37.3 C)  Resp: 18  Body mass index is 32.92 kg/(m^2).  ECOG: 2 Filed Weights   04/10/15 1256  Weight: 185 lb 12.8 oz (84.278 kg)   Sclerae unicteric, pupils equal and reactive Oropharynx clear and moist-- no thrush or other lesions No cervical or supraclavicular adenopathy Lungs no rales or rhonchi Heart regular rate and rhythm Abd soft, nontender, increased bowel sounds, no masses palpated MSK no focal spinal tenderness, no upper extremity lymphedema Neuro: nonfocal, well oriented, anxious and depressed affect Breasts: Status post bilateral mastectomies with TRAM reconstruction on the left and implant on the right. There is no evidence of local recurrence. Both axillae are benign.   LAB RESULTS:   Lab Results  Component Value Date   WBC 4.9 04/09/2015   NEUTROABS 3.7 04/09/2015   HGB 13.1 04/09/2015   HCT 40.3 04/09/2015   MCV 90.4 04/09/2015   PLT 241 04/09/2015      Chemistry      Component Value Date/Time   NA 142 04/09/2015 0904   NA 142 11/12/2012 0735   K 3.6 04/09/2015 0904   K 3.8 11/12/2012 0735   CL 104 11/14/2012 1407   CL 104 11/12/2012 0735   CO2 24 04/09/2015 0904   CO2 30 11/12/2012 0735   BUN 11.0 04/09/2015 0904    BUN 13 11/12/2012 0735   CREATININE 0.9 04/09/2015 0904   CREATININE 0.97 11/12/2012 0735      Component Value Date/Time   CALCIUM 9.3 04/09/2015 0904   CALCIUM 9.1 11/12/2012 0735   ALKPHOS 92 04/09/2015 0904   ALKPHOS 69 11/12/2012 0735   AST 32 04/09/2015 0904   AST 16 11/12/2012 0735  ALT 16 04/09/2015 0904   ALT 13 11/12/2012 0735   BILITOT 1.06 04/09/2015 0904   BILITOT 0.9 11/12/2012 0735      STUDIES:                                   Final                     Echocardiography  Report-Transthoracic                             Study  Date: 03/23/2015  Patient Information   Patient:      Kristin Hoffman, Kristin Hoffman         KZS:[id number]-  Address:      [address] Zip:      [place]  [address]  Telephone:      [phone]  Date of Birth:           Apr 06, 1966       Sex: Female  Sonographer:  Alisa Graff       Laboratory  MD:  Referring MD: Verlon Setting    Interpreting  MD: Marca Ancona Baseline Data   Age:                68  Height:       160  cm               Heart  Rate:     87  Weight:       87.  kg               Rhythm:    NSR  BSA:          1.9                   BP Resting:      114/77  Inpatient:    Outpatient            Scheduling  Type: Routine  Study Data    Case Number: [id number]                 RoomNumber:   OP   Date of Test:                      4/11/2016Study   End Time: 01:15  PM Indication(s):            Pre-chemotherapy    CPT Code      Description   93306         Resting transthoracic  echocardiography  with                 continous and pulsed  wave doppler  and color                 flow imaging complete  Quality of Study:     Adequate   Measurements                        Normal  Values                      Normal Values   LV           5.  3.5 - 5.6    Fractional    25       28 - 42%  Diastole     4        cm           Shortening  LV Systole   4        2.2 - 4.3    Aortic  Root  3.        2.0 - 3.7                        cm                         4        cm  LV Post.     1        0.7 - 1.2    Left Atrium   3.       1.9 - 4.0  Wall                  cm                         3        cm  Diastole  LV IVS       1        0.7 - 1.2    Right                  1.0 - 2.6  Diastole              cm           Ventricle              cm  Thickness  LV IVS                0.7 - 1.2    Ejection      55%        53% - 80%  Systole               cm           Fraction  Thickness                          (Rest)  LV Mass    Interpretation:  Clinical Diagnoses  and Echocardiographic  Findings     Normal  left ventricular  contractile function  (estimated  ejection     fraction  = 55%)  Descriptive Comments  Left Ventricle     There  is normal left ventricular  chamber  size, wall thickness  and     contraction.     The derived  left ventricular  ejection fraction  is 55%.     Abnormal  transmitral Doppler  flow profile  characteristic  of impaired     left ventricular  relaxation.  Mitral Valve     The mitral  valve is structurally  normal.     There  is trivial mitral  regurgitation by  color flow and  continuous     wave Doppler  examinations.  Left Atrium     The left  atrium is normal  in size.  Aortic Valve     The aortic  valve is trileaflet  with normal  excursion.     There  is no  evidence of  aortic regurgitation  by color flow  or     continuous  wave Doppler  examinations.     There  is no evidence of  a hemodynamically  important aortic     transvalvular  gradient;  the maximal instantaneous  left ventricular     outflow  velocity is 1.1  m/s.  Aorta     The thoracic  aorta is normal  in diameter  at the level of  the left     ventricular  outflow tract,  sinuses of Valsalva  and sinotubular     junction.  Pulmonary Artery     The pulmonary  artery is  not optimally imaged.  Pulmonary Valve     The pulmonary  valve is  normal.     There  is no detectable   pulmonary regurgitation  by color  flow Doppler     imaging.     There  is no evidence of  a hemodynamically  important pulmonary     transvalvular  gradient;  the maximal instantaneous  right ventricular     outflow  velocity is approximately  1.0 m/s.  Right Ventricle     There  is normal right ventricular  chamber  size and contraction.  Tricuspid Valve     The tricuspid  valve is  structurally normal.     There  is trivial tricuspid  regurgitation  by color flow and  continuous     wave Doppler  imaging; the  peak regurgitant  velocity is not  well-     defined.  Right Atrium     The right  atrium is normal  in size.  Inferior Vena Cava     The inferior  vena cava  is normal in size  with physiological  phasic     respiratory  change characteristic  of normal  central venous  and right     atrial  pressures.  Pericardium     There  is no evidence of  pericardial effusion.          ____________________________________________________________________                                Marca Ancona,  MD        electronically  signed  on 03/30/2015 11:46:37  AM with status  of Final   Status    EXAM: CT Chest, Abdomen & Pelvis with contrast  DATE: 03/23/15 15:34:30 ACCESSION: [id number] DICTATED: 03/23/15 16:14:18 INTERPRETATION LOCATION: [place]  CLINICAL INDICATION: 49 Year Old (F): C50.919 - Malignant neoplasm of female breast, unspecified laterality, unspecified site of breast (RAF-HCC)C50.919 - Malignant neoplasm of female breast, unspecified laterality, unspecified site of breast (RAF-HCC). OTHER    COMPARISON: Outside PET dated 02/27/2015.  TECHNIQUE: A spiral CT scan was obtained with IV contrast from the thoracic inlet through the pubic symphysis. Images were reconstructed in the axial plane.  Coronal and sagittal reformatted images were also provided for further evaluation.  FINDINGS:  CHEST: Right-sided portacatheter is in the right  atrium. Postsurgical changes in both breasts with bilateral prostheses in place. Surgical clips in the left axilla. No axillary lymphadenopathy. There is an enlarged lymph node in the superior mediastinum on the left measuring 2.6 x 2.5 cm. Right hilar lymph node measures 1.5 cm short axis. There are multiple small calcified mediastinal lymph nodes. Small left pleural effusion.  Multiple bilateral pulmonary nodules. One nodule in the left upper lobe measures 1.0 cm. There is nodularity along the left pleural surface. Diffuse prominence of the pulmonary interstitium with septal thickening.  ABDOMEN and PELVIS: There is an infiltrating mass in the hepatic dome measuring approximately 6.5 x 5.9 cm. The spleen and adrenals are unremarkable. The gallbladder is collapsed. There is a mass in the pancreatic neck measuring 3.3 x 2.9 cm (image 25). Soft tissue encases the main portal vein and narrows it.  Symmetric nephrograms without obstruction or nephrolithiasis. Status post hysterectomy. The bladder is unremarkable.  No bowel obstruction or focal inflammation. Scattered colonic diverticula. No retroperitoneal or pelvic lymphadenopathy.  BONES: Degenerative changes in the spine without focal lesions.  IMPRESSION: 1. Enlarged superior mediastinal and right hilar lymph nodes. Multiple pulmonary metastases. Septal thickening and prominence of the pulmonary interstitium may represent lymphangitic spread of disease. 2. Infiltrating mass in the hepatic dome. 3. Mass in the pancreatic neck with encasement and narrowing of the main portal vein. 4. Nodularity along the left pleural surface with a small left pleural effusion.        ASSESSMENT: 49 y.o. Kristin Hoffman woman  (1)  status post left lumpectomy under the care of Dr. 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 2 of 23 lymph nodes were involved.  Margins  were clear.  (2) status post adjuvant chemotherapy consisting of 6 q. three-week doses of docetaxel/ doxorubicin/ cyclophosphamide given between January 2007 and may of 2007, with last dose on 04/20/2006.  (3) status post radiation therapy to the left breast, completed 07/11/2006  (4) began on tamoxifen in early August 2007 and continued until October 2009. She status post hysterectomy with bilateral salpingo-oophorectomy on 07/15/2008, and was started on letrozole in October of 2009.  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 PET and CT of the chest showed no evidence of metastasis in January 2011.  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 Margins were clear. Patient underwent concurrent left latissimus flap reconstruction and right implant reconstruction.  (7)  status post additional chemotherapy with one cycle of carboplatin/gemcitabine given on 03/11/2010. She then received 5 q. three-week cycles of IV CMF between 03/25/2010 and 06/17/2010.  (8) started on exemestane in January 2012 and continued until late November 2013 when she was hospitalized for apparent TIA at which time her exemestane was stopped and not resumed  (9) METASTATIC DISEASE: evidence of disease recurence noted on chest CT, liver MRI and PET scan in December 2014, with suspicious lung nodules, lymphadenopathy, and 3 lesions in the right lobe of the liver, but no evidence of bony disease and no evidence of brain involvement by brain MRI September 2014. Biopsy of a left supraclavicular lymph node on 12/11/2013 confirmed metastatic carcinoma, estrogen receptor 45% positive, progesterone receptor  17% positive, with no HER-2 amplification.  (10) fulvestrant started 12/03/2013, discontinued 03/25/2014 with progression  (11) exemestane/ everolimus started 04/04/2014; everolimus held 04/14/2014, with skin rash and mouth soreness; resumed at 5 mg a day as of 04/29/2014, discontinued 05/09/2014 with similar symptoms  (12) continueing exemestane, adding palbociclib 05/26/2014;   (a) palbociclib dose dropped  to 75 mg every other day for 21 days beginning with cycle 4  (b) exemestane and Palbociclib discontinued December 2015 with evidence of progression  (13) [facility] referral placed 04/28/2049 -- re-sent 05/23/2014-- shows Pi3KCA and TP53 mutations  (a) [facility] one test requested 12/10/2014-- confirms [employer] results  (b) did not qualify for capecitabine/ BYL 719 trial because of elevated lipase  (14) started eribulin 12/26/2014, stopped 01/23/2015 after 2 cycles because of neuropathy; repeat liver MRI 02/25/2015 shows progression   (15) capecitabine started 03/23/2015, initially 2 weeks 1 week off, then every other wee starting with cycle 2   PLAN:  Naylee was crying throughout our visit today. She is of course worried about the fact that her cancer has grown. She also "doesn't feel well", and has to depend on others which makes her feel a worsen useless. Finally she went off the Lexapro (and got on Zoloft) and wonders if that what the problem is.  I have no problems with her going back on Lexapro. I suggested she started now and not stopped the Zoloft until she's been on Lexapro 5 days, then take the Zoloft every other day for a week and then stop it.  She might be able to tolerate the capecitabine better one week on 1 week off. We will start that with the coming cycle, starting May 2. If she still feels this bad physically we will drop the dose from 2 g twice a day to 1.5 g twice a day.  We will check lab work at the start of each two-week cycle. She will see me specifically June 13. A  few days before we will do repeat CAT scans. We have the CT scans obtained at [facility] 03/23/2015 to serve as the new baseline. If the capecitabine does not give [place] disease control, I think we need to go on aggressive and try carboplatin/paclitaxel.  We checked a C. difficile yesterday, but results are still pending. We're going to give her some fluids today and tomorrow. She will let us know on Monday if the diarrhea persists. In the meantime I am also adding Questran to her regimen.   Chauncey Cruel, MD   04/10/2015 2:11 PM

## 2015-04-10 NOTE — Addendum Note (Signed)
Addended by: [name] on: 04/10/2015 04:06 PM   Modules accepted: Orders

## 2015-04-11 ENCOUNTER — Encounter: Payer: Self-pay | Admitting: Nurse Practitioner

## 2015-04-11 ENCOUNTER — Ambulatory Visit (HOSPITAL_BASED_OUTPATIENT_CLINIC_OR_DEPARTMENT_OTHER): Payer: 59

## 2015-04-11 VITALS — BP 131/73 | HR 85 | Temp 97.9°F | Resp 18

## 2015-04-11 DIAGNOSIS — T451X5A Adverse effect of antineoplastic and immunosuppressive drugs, initial encounter: Secondary | ICD-10-CM

## 2015-04-11 DIAGNOSIS — Z8673 Personal history of transient ischemic attack (TIA), and cerebral infarction without residual deficits: Secondary | ICD-10-CM

## 2015-04-11 DIAGNOSIS — D630 Anemia in neoplastic disease: Secondary | ICD-10-CM

## 2015-04-11 DIAGNOSIS — C50919 Malignant neoplasm of unspecified site of unspecified female breast: Secondary | ICD-10-CM

## 2015-04-11 DIAGNOSIS — C50312 Malignant neoplasm of lower-inner quadrant of left female breast: Secondary | ICD-10-CM

## 2015-04-11 DIAGNOSIS — R197 Diarrhea, unspecified: Secondary | ICD-10-CM | POA: Insufficient documentation

## 2015-04-11 DIAGNOSIS — G620 Drug-induced polyneuropathy: Secondary | ICD-10-CM

## 2015-04-11 MED ORDER — SODIUM CHLORIDE 0.9 % IV SOLN
INTRAVENOUS | Status: DC
  Administered 2015-04-11: 10:00:00 via INTRAVENOUS

## 2015-04-11 MED ORDER — SODIUM CHLORIDE 0.9 % IJ SOLN
10.0000 mL | INTRAMUSCULAR | Status: DC | PRN
  Administered 2015-04-11: 10 mL
  Filled 2015-04-11: qty 10

## 2015-04-11 MED ORDER — HEPARIN SOD (PORK) LOCK FLUSH 100 UNIT/ML IV SOLN
500.0000 [IU] | Freq: Once | INTRAVENOUS | Status: AC | PRN
  Administered 2015-04-11: 500 [IU]
  Filled 2015-04-11: qty 5

## 2015-04-11 NOTE — Assessment & Plan Note (Addendum)
Pt also c/o chemotherapy-induced mucositis.  On exam- entire oral mucosa with mild erythema and sensitivity; but no obvious oral lesions. No thrush noted.   Pt already has nystatin swish/swallow; and has magic mouthwash ready to pick up at her pharmacy now.

## 2015-04-11 NOTE — Progress Notes (Signed)
SYMPTOM MANAGEMENT CLINIC   HPI: Kristin Hoffman 49 y.o. female diagnosed with breast cancer. Currently undergoing xeldoa oral therapy.   Pt is c/o chronic diarrhea this past week; with chronic nausea as well.  Denies any vomiting. She is also c/o mucosits.  She feels dehydrated today.  She denies any recent fever or chills.    HPI  ROS  Past Medical History  Diagnosis Date  . Breast cancer   . Depression   . Reflux   . Cancer   . Hx-TIA (transient ischemic attack) 11/22/2013  . Chronic headaches 11/22/2013  . Anxiety 11/22/2013  . Breast cancer metastasized to multiple sites 12/24/2013    Past Surgical History  Procedure Laterality Date  . Mastectomy w/ nodes partial    . Mastectomy    . Breast reconstruction    . Portacath placement      x 2  . Portacath removal      x 2  . Abdominal hysterectomy    . Tee without cardioversion  11/12/2012    Procedure: TRANSESOPHAGEAL ECHOCARDIOGRAM (TEE);  Surgeon: Candee Furbish, MD;  Location: [facility] ENDOSCOPY;  Service: Cardiovascular;  Laterality: N/A;  This TEE may be Dr. Marlou Porch or a [facility] Dr    has Hx-TIA (transient ischemic attack); Chronic headaches; Anxiety; Cancer of lower-inner quadrant of left female breast; Breast cancer metastasized to multiple sites; Drug induced neutropenia(288.03); Back spasm; Chemotherapy-induced neuropathy; Heart burn; Anemia in neoplastic disease; Nausea with vomiting; Mucositis due to chemotherapy; Anorexia; Neoplastic malignant related fatigue; Dehydration; and Diarrhea on her problem list.    is allergic to afinitor.    Medication List       This list is accurate as of: 04/09/15 11:59 PM.  Always use your most recent med list.               acidophilus Caps capsule  Take 1 capsule by mouth daily.     aspirin 81 MG tablet  Take 81 mg by mouth daily.     Biotin 5000 MCG Caps  Take 1 capsule (5,000 mcg total) by mouth daily.     cetirizine 10 MG tablet  Commonly known as:  ZYRTEC  Take 10 mg  by mouth daily. Pt takes this med at night     Cholecalciferol 2000 UNITS Chew  Chew 1 tablet by mouth daily.     diphenoxylate-atropine 2.5-0.025 MG per tablet  Commonly known as:  LOMOTIL  Take 2 tablets by mouth 4 (four) times daily as needed for diarrhea or loose stools.     escitalopram 20 MG tablet  Commonly known as:  LEXAPRO  Take 20 mg by mouth daily with breakfast.     Ibuprofen 200 MG Caps  Commonly known as:  ADVIL  Take 2 capsules (400 mg total) by mouth 3 (three) times daily with meals as needed.     lidocaine-prilocaine cream  Commonly known as:  EMLA  Apply to affected area once     LORazepam 0.5 MG tablet  Commonly known as:  ATIVAN  TAKE 1 TABLTE BY MOUTH AS NEEDED FOR AUSEA AND VOMITING     Melatonin 10 MG Tabs  Take 1 tablet by mouth at bedtime.     metoCLOPramide 5 MG tablet  Commonly known as:  REGLAN  Take 10 mg by mouth 4 (four) times daily.     montelukast 10 MG tablet  Commonly known as:  SINGULAIR  Take 10 mg by mouth daily. Pt takes this med in the  morning.     multivitamin tablet  Take 1 tablet by mouth daily.     nystatin 100000 UNIT/ML suspension  Commonly known as:  MYCOSTATIN  SWISH, SWALLOW AND SPIT WITH 5-10 MLS BY MOUTH 4 TIMES DAILY AS NEEDED     omeprazole 40 MG capsule  Commonly known as:  PRILOSEC  Take 40 mg by mouth daily.     traMADol 50 MG tablet  Commonly known as:  ULTRAM  Take 1 tablet (50 mg total) by mouth every 6 (six) hours as needed.         PHYSICAL EXAMINATION  Oncology Vitals 04/11/2015 04/11/2015 04/10/2015 04/09/2015 02/27/2015 02/13/2015 02/06/2015  Height - - 160 cm - 160 cm 160 cm 160 cm  Weight - - 84.278 kg 83.825 kg 85.775 kg 89.903 kg 89.041 kg  Weight (lbs) - - 185 lbs 13 oz 184 lbs 13 oz 189 lbs 2 oz 198 lbs 3 oz 196 lbs 5 oz  BMI (kg/m2) - - 32.91 kg/m2 - 33.5 kg/m2 35.11 kg/m2 34.77 kg/m2  Temp - 97.9 99.2 98.1 98.7 98.4 98.6  Pulse 85 98 88 106 101 92 92  Resp 18 18 18 18 18 18 18   Resp  (Historical as of 07/12/12) - - - - - - -  SpO2 - - - 98 - - -  BSA (m2) - - 1.94 m2 - 1.95 m2 2 m2 1.99 m2   BP Readings from Last 3 Encounters:  04/11/15 131/73  04/10/15 140/85  04/09/15 129/85    Physical Exam  Constitutional: She is oriented to person, place, and time and well-developed, well-nourished, and in no distress.  HENT:  Head: Normocephalic and atraumatic.  Mild eythema to oral mucoas; but no thrush or oral lesions noted.   Eyes: Conjunctivae and EOM are normal. Pupils are equal, round, and reactive to light. Right eye exhibits no discharge. Left eye exhibits no discharge. No scleral icterus.  Neck: Normal range of motion. Neck supple. No JVD present. No tracheal deviation present. No thyromegaly present.  Cardiovascular: Normal rate, regular rhythm, normal heart sounds and intact distal pulses.   Pulmonary/Chest: Effort normal and breath sounds normal. No respiratory distress. She has no wheezes. She has no rales. She exhibits no tenderness.  Abdominal: Soft. Bowel sounds are normal. She exhibits no distension and no mass. There is no tenderness. There is no rebound and no guarding.  Musculoskeletal: Normal range of motion. She exhibits no edema or tenderness.  Lymphadenopathy:    She has no cervical adenopathy.  Neurological: She is alert and oriented to person, place, and time. Gait normal.  Skin: Skin is warm and dry. No rash noted. No erythema. There is pallor.  Psychiatric: Affect normal.  Nursing note and vitals reviewed.   LABORATORY DATA:. Appointment on 04/09/2015  Component Date Value Ref Range Status  . Sodium 04/09/2015 142  136 - 145 mEq/L Final  . Potassium 04/09/2015 3.6  3.5 - 5.1 mEq/L Final  . Chloride 04/09/2015 105  98 - 109 mEq/L Final  . CO2 04/09/2015 24  22 - 29 mEq/L Final  . Glucose 04/09/2015 100  70 - 140 mg/dl Final  . BUN 04/09/2015 11.0  7.0 - 26.0 mg/dL Final  . Creatinine 04/09/2015 0.9  0.6 - 1.1 mg/dL Final  . Total Bilirubin  04/09/2015 1.06  0.20 - 1.20 mg/dL Final  . Alkaline Phosphatase 04/09/2015 92  40 - 150 U/L Final  . AST 04/09/2015 32  5 - 34 U/L Final  .  ALT 04/09/2015 16  0 - 55 U/L Final  . Total Protein 04/09/2015 6.4  6.4 - 8.3 g/dL Final  . Albumin 04/09/2015 3.8  3.5 - 5.0 g/dL Final  . Calcium 04/09/2015 9.3  8.4 - 10.4 mg/dL Final  . Anion Gap 04/09/2015 12* 3 - 11 mEq/L Final  . EGFR 04/09/2015 76* >90 ml/min/1.73 m2 Final   eGFR is calculated using the CKD-EPI Creatinine Equation (2009)  . WBC 04/09/2015 4.9  3.9 - 10.3 10e3/uL Final  . NEUT# 04/09/2015 3.7  1.5 - 6.5 10e3/uL Final  . HGB 04/09/2015 13.1  11.6 - 15.9 g/dL Final  . HCT 04/09/2015 40.3  34.8 - 46.6 % Final  . Platelets 04/09/2015 241  145 - 400 10e3/uL Final  . MCV 04/09/2015 90.4  79.5 - 101.0 fL Final  . MCH 04/09/2015 29.4  25.1 - 34.0 pg Final  . MCHC 04/09/2015 32.5  31.5 - 36.0 g/dL Final  . RBC 04/09/2015 4.46  3.70 - 5.45 10e6/uL Final  . RDW 04/09/2015 15.8* 11.2 - 14.5 % Final  . lymph# 04/09/2015 0.8* 0.9 - 3.3 10e3/uL Final  . MONO# 04/09/2015 0.3  0.1 - 0.9 10e3/uL Final  . Eosinophils Absolute 04/09/2015 0.1  0.0 - 0.5 10e3/uL Final  . Basophils Absolute 04/09/2015 0.0  0.0 - 0.1 10e3/uL Final  . NEUT% 04/09/2015 75.0  38.4 - 76.8 % Final  . LYMPH% 04/09/2015 16.4  14.0 - 49.7 % Final  . MONO% 04/09/2015 5.9  0.0 - 14.0 % Final  . EOS% 04/09/2015 2.5  0.0 - 7.0 % Final  . BASO% 04/09/2015 0.2  0.0 - 2.0 % Final     RADIOGRAPHIC STUDIES: No results found.  ASSESSMENT/PLAN:    Cancer of lower-inner quadrant of left female breast Pt currently undergoing xeloda oral therapy. She states that this is her week off of the xeloda.   She is scheduled to return tomorrow for follow u visit with [name]; and to receive additional IV fluids.    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    Nausea with vomiting C/o nausea; but no vomiting. Pt was given  zofran IV while at the cancer center; and confirmed that she has antiemetics at home to take as well.    Mucositis due to chemotherapy Pt also c/o chemotherapy-induced mucositis.  On exam- entire oral mucosa with mild erythema and sensitivity; but no obvious oral lesions. No thrush noted.   Pt already has nystatin swish/swallow; and has magic mouthwash ready to pick up at her pharmacy now.    Anorexia C/o minimal appetite secondary to mucositis and nausea. Pt encouraged to eat multiple small meals throughout the day if possible.    Neoplastic malignant related fatigue Chemotherapy related fatigue; which is most likely increased secondary to dehydration.  Hopefully, pt will feel some better after receiving iv fluid rehydration.    Dehydration Pt c/o nausea, diarrhea, and mucositis. She feels dehydrated today.  Pt received approx 1500 mls IV fluid rehydration while at the cnacer center today.  She has plans to return tomorrow for additional rehydration. She was also encouraged to push fluids at home.    Diarrhea C/o chronic diarrhea for past week.  Pt has tried imodium with minimal effectiveness.  Pt appears dehydrated today;and will receive iv fluid rehydration.  She will be prescribed Lomotil to try.  Also gave pt stool sample kit to take home; with instructions to bring back tomorrow if possible to rule out C-diff.  Patient stated understanding of all instructions; and was in agreement with this plan of care. The patient knows to call the clinic with any problems, questions or concerns.   Review/collaboration with [name] regarding all aspects of patient's visit today.   Total time spent with patient was 40  minutes;  with greater than 75  percent of that time spent in face to face counseling regarding patient's symptoms,  and coordination of care and follow up.  Disclaimer: This note was dictated with voice recognition software. Similar  sounding words can inadvertently be transcribed and may not be corrected upon review.   Drue Second, NP 04/11/2015

## 2015-04-11 NOTE — Assessment & Plan Note (Signed)
Pt currently undergoing xeloda oral therapy. She states that this is her week off of the xeloda.   She is scheduled to return tomorrow for follow u visit with [name]; and to receive additional IV fluids.

## 2015-04-11 NOTE — Patient Instructions (Signed)
[facility] Discharge Instructions for Patients IV Fluids Today you received the following : IVF  Please follow [name] order on the use of anti diarrheal medications. Immodium and Lomotil.  Medications instructions will be printed on AVS.   To help prevent nausea and vomiting after your treatment, we encourage you to take your nausea medication as prescribed by [name].  If you develop nausea and vomiting that is not controlled by your nausea medication, call the clinic.   BELOW ARE SYMPTOMS THAT SHOULD BE REPORTED IMMEDIATELY:  *FEVER GREATER THAN 100.5 F  *CHILLS WITH OR WITHOUT FEVER  NAUSEA AND VOMITING THAT IS NOT CONTROLLED WITH YOUR NAUSEA MEDICATION  *UNUSUAL SHORTNESS OF BREATH  *UNUSUAL BRUISING OR BLEEDING  TENDERNESS IN MOUTH AND THROAT WITH OR WITHOUT PRESENCE OF ULCERS  *URINARY PROBLEMS  *BOWEL PROBLEMS  UNUSUAL RASH Items with * indicate a potential emergency and should be followed up as soon as possible.  Feel free to call the clinic you have any questions or concerns. The clinic phone number is [phone].  Please show the [facility] at check-in to the Emergency Department and triage nurse.

## 2015-04-11 NOTE — Assessment & Plan Note (Signed)
Pt's chemotherapy-induced neuropathy is stable at present.

## 2015-04-11 NOTE — Assessment & Plan Note (Addendum)
Pt c/o nausea, diarrhea, and mucositis. She feels dehydrated today.  Pt received approx 1500 mls IV fluid rehydration while at the cnacer center today.  She has plans to return tomorrow for additional rehydration. She was also encouraged to push fluids at home.

## 2015-04-11 NOTE — Assessment & Plan Note (Signed)
C/o minimal appetite secondary to mucositis and nausea. Pt encouraged to eat multiple small meals throughout the day if possible.

## 2015-04-11 NOTE — Assessment & Plan Note (Signed)
C/o nausea; but no vomiting. Pt was given zofran IV while at the cancer center; and confirmed that she has antiemetics at home to take as well.

## 2015-04-11 NOTE — Assessment & Plan Note (Signed)
C/o chronic diarrhea for past week.  Pt has tried imodium with minimal effectiveness.  Pt appears dehydrated today;and will receive iv fluid rehydration.  She will be prescribed Lomotil to try.  Also gave pt stool sample kit to take home; with instructions to bring back tomorrow if possible to rule out C-diff.

## 2015-04-11 NOTE — Assessment & Plan Note (Signed)
Chemotherapy related fatigue; which is most likely increased secondary to dehydration.  Hopefully, pt will feel some better after receiving iv fluid rehydration.

## 2015-04-12 LAB — URINE CULTURE

## 2015-04-13 ENCOUNTER — Other Ambulatory Visit: Payer: Self-pay | Admitting: *Deleted

## 2015-04-13 DIAGNOSIS — C50312 Malignant neoplasm of lower-inner quadrant of left female breast: Secondary | ICD-10-CM

## 2015-04-14 ENCOUNTER — Telehealth: Payer: Self-pay | Admitting: *Deleted

## 2015-04-14 NOTE — Telephone Encounter (Signed)
Kristin Hoffman called asking "when am I to stop taking the medicine for diarrhea?"  Reports last loose stool was yesterday and today she's had one soft stool.  Instructed to stop imodium and lomotil after going 12 hours without a bowel movement.  Asked bout meals.  Advised to gradually add food starting with full liquids and soft foods and to avoid fried, spicy, caffeine, whole wheat to make sure she doesn't revert back to diarrhea.

## 2015-04-20 ENCOUNTER — Other Ambulatory Visit: Payer: Self-pay | Admitting: *Deleted

## 2015-04-20 ENCOUNTER — Other Ambulatory Visit: Payer: Self-pay | Admitting: Nurse Practitioner

## 2015-04-24 ENCOUNTER — Ambulatory Visit (HOSPITAL_BASED_OUTPATIENT_CLINIC_OR_DEPARTMENT_OTHER): Payer: 59 | Admitting: Nurse Practitioner

## 2015-04-24 ENCOUNTER — Other Ambulatory Visit (HOSPITAL_BASED_OUTPATIENT_CLINIC_OR_DEPARTMENT_OTHER): Payer: 59

## 2015-04-24 ENCOUNTER — Encounter: Payer: Self-pay | Admitting: Nurse Practitioner

## 2015-04-24 VITALS — BP 123/84 | HR 91 | Temp 99.4°F | Resp 18 | Wt 180.5 lb

## 2015-04-24 DIAGNOSIS — T451X5A Adverse effect of antineoplastic and immunosuppressive drugs, initial encounter: Secondary | ICD-10-CM

## 2015-04-24 DIAGNOSIS — R51 Headache: Secondary | ICD-10-CM

## 2015-04-24 DIAGNOSIS — C799 Secondary malignant neoplasm of unspecified site: Secondary | ICD-10-CM | POA: Diagnosis not present

## 2015-04-24 DIAGNOSIS — Z8673 Personal history of transient ischemic attack (TIA), and cerebral infarction without residual deficits: Secondary | ICD-10-CM

## 2015-04-24 DIAGNOSIS — C50912 Malignant neoplasm of unspecified site of left female breast: Secondary | ICD-10-CM

## 2015-04-24 DIAGNOSIS — C50919 Malignant neoplasm of unspecified site of unspecified female breast: Secondary | ICD-10-CM

## 2015-04-24 DIAGNOSIS — G620 Drug-induced polyneuropathy: Secondary | ICD-10-CM | POA: Diagnosis not present

## 2015-04-24 DIAGNOSIS — C50312 Malignant neoplasm of lower-inner quadrant of left female breast: Secondary | ICD-10-CM | POA: Diagnosis not present

## 2015-04-24 DIAGNOSIS — R197 Diarrhea, unspecified: Secondary | ICD-10-CM

## 2015-04-24 DIAGNOSIS — R519 Headache, unspecified: Secondary | ICD-10-CM

## 2015-04-24 LAB — CBC WITH DIFFERENTIAL/PLATELET
BASO%: 0.3 % (ref 0.0–2.0)
Basophils Absolute: 0 10*3/uL (ref 0.0–0.1)
EOS ABS: 0.1 10*3/uL (ref 0.0–0.5)
EOS%: 1.9 % (ref 0.0–7.0)
HCT: 36.6 % (ref 34.8–46.6)
HGB: 12.1 g/dL (ref 11.6–15.9)
LYMPH#: 0.9 10*3/uL (ref 0.9–3.3)
LYMPH%: 23.5 % (ref 14.0–49.7)
MCH: 29.7 pg (ref 25.1–34.0)
MCHC: 33.1 g/dL (ref 31.5–36.0)
MCV: 89.9 fL (ref 79.5–101.0)
MONO#: 0.6 10*3/uL (ref 0.1–0.9)
MONO%: 15.2 % — ABNORMAL HIGH (ref 0.0–14.0)
NEUT%: 59.1 % (ref 38.4–76.8)
NEUTROS ABS: 2.1 10*3/uL (ref 1.5–6.5)
Platelets: 250 10*3/uL (ref 145–400)
RBC: 4.07 10*6/uL (ref 3.70–5.45)
RDW: 18.4 % — AB (ref 11.2–14.5)
WBC: 3.6 10*3/uL — AB (ref 3.9–10.3)

## 2015-04-24 LAB — COMPREHENSIVE METABOLIC PANEL (CC13)
ALBUMIN: 3.3 g/dL — AB (ref 3.5–5.0)
ALT: 17 U/L (ref 0–55)
ANION GAP: 10 meq/L (ref 3–11)
AST: 38 U/L — AB (ref 5–34)
Alkaline Phosphatase: 90 U/L (ref 40–150)
BUN: 9 mg/dL (ref 7.0–26.0)
CO2: 23 mEq/L (ref 22–29)
Calcium: 8.7 mg/dL (ref 8.4–10.4)
Chloride: 106 mEq/L (ref 98–109)
Creatinine: 0.9 mg/dL (ref 0.6–1.1)
EGFR: 75 mL/min/{1.73_m2} — ABNORMAL LOW (ref >90)
GLUCOSE: 98 mg/dL (ref 70–140)
Potassium: 3.5 mEq/L (ref 3.5–5.1)
SODIUM: 140 meq/L (ref 136–145)
TOTAL PROTEIN: 5.8 g/dL — AB (ref 6.4–8.3)
Total Bilirubin: 0.94 mg/dL (ref 0.20–1.20)

## 2015-04-24 LAB — RESEARCH LABS

## 2015-04-24 NOTE — Addendum Note (Signed)
Addended by: Marcelino Duster on: 04/24/2015 03:50 PM   Modules accepted: Level of Service

## 2015-04-24 NOTE — Progress Notes (Signed)
ID: Kristin Hoffman OB: [birth year]  MR#: [id number]  TMA#:[id number]  PCP: Drake Leach, MD GYN:  [name] SU: [name] OTHER MD: [name], [name]  CHIEF COMPLAINT:  Stage IV breast cancer  CURRENT TREATMENT: eribulin  BREAST CANCER HISTORY: This patient was previously followed by [name], and was transferred to [name] service as of 08/20/2013.  At the age of 49, the patient had a screening mammogram as a baseline. She had recently given birth and was on birth control pills at that time. The mammogram in November 2006 showed an area of architectural distortion in the left lower inner quadrant. Subsequently an ultrasound was obtained of the left breast showing a 2.0 x 1.5 x 1.4 cm mass, at the 8:00 position, 4 cm from the nipple. A core biopsy performed on 10/21/2005 showed an invasive mammary carcinoma, ER +70%, PR +41%, HER-2/neu negative, with proliferation marker of 38%. [phone])  Breast MRI on 11/08/2005 confirmed a 2.5 cm spiculated mass in the left lower inner quadrant with 3 small satellite nodules adjacent to the primary mass: 3.5 cm posterior medial to the primary mass, measuring 9 mm; 1.5 cm anterior medial to the primary mass measuring 5 mm; and 2 cm medial to the primary mass measuring 5 mm. No axillary adenopathy was noted. No suspicious masses or enhancement are noted in the right breast.  Kristin Hoffman underwentt left lumpectomy under the care of Dr. 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 2 of 23 lymph nodes were involved.  Margins were clear.  She received adjuvant chemotherapy consisting of 6 q. three-week doses of docetaxel/doxorubicin/cyclophosphamide given between January 2007 and may of 2007, with last dose on 04/20/2006. She underwent radiation therapy completed 07/11/2006, after which she began on tamoxifen in early August 2007.  She  underwent hysterectomy with bilateral salpingo-oophorectomy on 07/15/2008, and was started on letrozole in October of 2009.  A mammogram 11/10/2009 showed microcalcifications in the left breast, and a subsequent biopsy on 11/18/2009 confirmed invasive mammary carcinoma in the left breast. PET and CT of the chest showed no evidence of metastasis in January 2011.  She underwent bilateral mastectomies 01/25/2010, with the right breast showing no evidence of malignancy; in the left breast showing a 1.0 cm grade 2 invasive ductal carcinoma with high-grade DCIS. Tumor was ER +22%, PR negative, HER-2/neu negative, with proliferation marker of 13%. Margins were clear. Patient underwent concurrent left latissimus flap reconstruction and right implant reconstruction.  She received additional chemotherapy with one cycle of carboplatin/gemcitabine given on 03/11/2010. She then received 5 q. three-week cycles of IV CMF between 03/25/2010 and 06/17/2010.  She was started on exemestane in January 2012 and continued until late November 2013 when she was hospitalized for apparent TIA.   Her subsequent history is as detailed below  INTERVAL HISTORY: Kristin Hoffman returns today for followup of her metastatic breast cancer. She was started on 2g capecitabine BID on 4/11 and is now on this dose every other week. Unfortunately she has had severe diarrhea and suffered from dehydration. She keeping up with her fluids now and after being started on imodium, lomitil, and questran she is having 5 BMs daily, down from 10+ daily. In addition she has experienced pain and mild erythema to her palms and soles during the week of treatment. But none since she stopped this past cycle on Sunday. The pain is 7/10 when present, and is  not associated with any peeling.  REVIEW OF SYSTEMS: Kristin Hoffman denies fevers or chills. Her nausea is minimal. She is chronically fatigued, but sleeps well at night. She is back on lexapro and this is more helpful to her  than the zoloft was. Occasionally her gums are sore, but there are no lesions present. A detailed review of systems is otherwise stable.  PAST MEDICaL HISTORY: Past Medical History  Diagnosis Date  . Breast cancer   . Depression   . Reflux   . Cancer   . Hx-TIA (transient ischemic attack) 11/22/2013  . Chronic headaches 11/22/2013  . Anxiety 11/22/2013  . Breast cancer metastasized to multiple sites 12/24/2013    PAST SURGICAL HISTORY: Past Surgical History  Procedure Laterality Date  . Mastectomy w/ nodes partial    . Mastectomy    . Breast reconstruction    . Portacath placement      x 2  . Portacath removal      x 2  . Abdominal hysterectomy    . Tee without cardioversion  11/12/2012    Procedure: TRANSESOPHAGEAL ECHOCARDIOGRAM (TEE);  Surgeon: Candee Furbish, MD;  Location: [facility] ENDOSCOPY;  Service: Cardiovascular;  Laterality: N/A;  This TEE may be Dr. Marlou Porch or a LHC Dr    FAMILY HISTORY Both parents are alive and well. Patient has one sister who is 25 years younger and is in good health. No other history of breast or ovarian cancer in the family. No family history on file.  GYNECOLOGIC HISTORY:   (Updated January 2015) G2P2, menarche at age 70 with irregular menses. On birth control pills in the past. On Clomid to induce ovulation with first pregnancy. Also had preeclampsia with first pregnancy. Status post hysterectomy and bilateral salpingo-oophorectomy in August 2009.  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 She is originally from [place]. She's been married to Kristin Hoffman, for 13 years. He works as an Pharmacist, hospital at [employer].   ADVANCED DIRECTIVES:  Not in place; the patient's husband is her HCPOA  HEALTH MAINTENANCE:  (Updated 11/22/2013) History  Substance Use Topics  . Smoking status: Never Smoker   . Smokeless tobacco: Never Used  . Alcohol Use: Yes     Comment:  rarely     Colonoscopy:  Never  PAP: S/P LAVH/BSO in August 2009  Bone density: Never  Lipid panel: Not on file   Allergies  Allergen Reactions  . Afinitor [Everolimus] Hives    Current Outpatient Prescriptions  Medication Sig Dispense Refill  . acidophilus (RISAQUAD) CAPS capsule Take 1 capsule by mouth daily.    Marland Kitchen aspirin 81 MG tablet Take 81 mg by mouth daily.    . Biotin 5000 MCG CAPS Take 1 capsule (5,000 mcg total) by mouth daily. 30 capsule   . cetirizine (ZYRTEC) 10 MG tablet Take 10 mg by mouth daily. Pt takes this med at night    . Cholecalciferol 2000 UNITS CHEW Chew 1 tablet by mouth daily.    . cholestyramine (QUESTRAN) 4 G packet Take 1 packet (4 g total) by mouth 3 (three) times daily with meals. 60 each 12  . cyclobenzaprine (FLEXERIL) 5 MG tablet   1  . diphenoxylate-atropine (LOMOTIL) 2.5-0.025 MG per tablet TAKE 2 TABLETS BY MOUTH 4 TIMES A DAY AS NEEDED DIARRHEA OR LOOSE STOOLS 30 tablet 0  . escitalopram (LEXAPRO) 20 MG tablet Take 20 mg by mouth daily  with breakfast.     . Ibuprofen (ADVIL) 200 MG CAPS Take 2 capsules (400 mg total) by mouth 3 (three) times daily with meals as needed. 120 each 0  . lidocaine-prilocaine (EMLA) cream Apply to affected area once 30 g 3  . LORazepam (ATIVAN) 0.5 MG tablet TAKE 1 TABLTE BY MOUTH AS NEEDED FOR AUSEA AND VOMITING 30 tablet 0  . Melatonin 10 MG TABS Take 1 tablet by mouth at bedtime.    . montelukast (SINGULAIR) 10 MG tablet Take 10 mg by mouth daily. Pt takes this med in the morning.    . Multiple Vitamin (MULTIVITAMIN) tablet Take 1 tablet by mouth daily.    Marland Kitchen omeprazole (PRILOSEC) 40 MG capsule Take 40 mg by mouth daily.     . traMADol (ULTRAM) 50 MG tablet Take 1 tablet (50 mg total) by mouth every 6 (six) hours as needed. 30 tablet 3  . capecitabine (XELODA) 500 MG tablet Take 2,000 mg by mouth.    . dexamethasone (DECADRON) 4 MG tablet     . dexamethasone (DECADRON) 4 MG tablet   1  . fluconazole (DIFLUCAN) 100  MG tablet     . metoCLOPramide (REGLAN) 5 MG tablet Take 10 mg by mouth 4 (four) times daily.    Marland Kitchen nystatin (MYCOSTATIN) 100000 UNIT/ML suspension Take 5 mLs (500,000 Units total) by mouth 4 (four) times daily. 240 mL 1  . ondansetron (ZOFRAN) 8 MG tablet     . prochlorperazine (COMPAZINE) 10 MG tablet     . sertraline (ZOLOFT) 50 MG tablet Take 50 mg by mouth daily.  5  . sucralfate (CARAFATE) 1 G tablet   2   No current facility-administered medications for this visit.    OBJECTIVE: Young white woman who was tearful during today's visit Filed Vitals:   04/24/15 1351  BP: 123/84  Pulse: 91  Temp: 99.4 F (37.4 C)  Resp: 18  Body mass index is 31.98 kg/(m^2).  ECOG: 2 Filed Weights   04/24/15 1351  Weight: 180 lb 8 oz (81.874 kg)   Skin: warm, dry  HEENT: sclerae anicteric, conjunctivae pink, oropharynx clear. No thrush or mucositis.  Lymph Nodes: No cervical or supraclavicular lymphadenopathy  Lungs: clear to auscultation bilaterally, no rales, wheezes, or rhonci  Heart: regular rate and rhythm  Abdomen: round, soft, non tender, positive bowel sounds  Musculoskeletal: No focal spinal tenderness, no peripheral edema  Neuro: non focal, well oriented, positive affect  Breasts: deferred  LAB RESULTS:   Lab Results  Component Value Date   WBC 3.6* 04/24/2015   NEUTROABS 2.1 04/24/2015   HGB 12.1 04/24/2015   HCT 36.6 04/24/2015   MCV 89.9 04/24/2015   PLT 250 04/24/2015      Chemistry      Component Value Date/Time   NA 140 04/24/2015 1309   NA 142 11/12/2012 0735   K 3.5 04/24/2015 1309   K 3.8 11/12/2012 0735   CL 104 11/14/2012 1407   CL 104 11/12/2012 0735   CO2 23 04/24/2015 1309   CO2 30 11/12/2012 0735   BUN 9.0 04/24/2015 1309   BUN 13 11/12/2012 0735   CREATININE 0.9 04/24/2015 1309   CREATININE 0.97 11/12/2012 0735      Component Value Date/Time   CALCIUM 8.7 04/24/2015 1309   CALCIUM 9.1 11/12/2012 0735   ALKPHOS 90 04/24/2015 1309   ALKPHOS  69 11/12/2012 0735   AST 38* 04/24/2015 1309   AST 16 11/12/2012 0735   ALT 17  04/24/2015 1309   ALT 13 11/12/2012 0735   BILITOT 0.94 04/24/2015 1309   BILITOT 0.9 11/12/2012 0735      STUDIES:                                   Final                     Echocardiography  Report-Transthoracic                             Study  Date: 03/23/2015  Patient Information   Patient:      Kristin Hoffman, Kristin Hoffman         GQQ:[id number]-  Address:      [address], Zip:      [place]  [address]  Telephone:      [phone]  Date of Birth:           1966-09-25       Sex: Female  Sonographer:  Alisa Graff       Laboratory  MD:  Referring MD: Verlon Setting    Interpreting  MD: Hinderliter,  Hyman Hopes Baseline Data   Age:                65  Height:       160  cm               Heart  Rate:     87  Weight:       87.  kg               Rhythm:    NSR  BSA:          1.9                   BP Resting:      114/77  Inpatient:    Outpatient            Scheduling  Type: Routine  Study Data    Case Number: [id number]                 RoomNumber:   OP   Date of Test:                      4/11/2016Study   End Time: 01:15  PM Indication(s):            Pre-chemotherapy    CPT Code      Description   93306         Resting transthoracic  echocardiography  with                 continous and pulsed  wave doppler  and color                 flow imaging complete  Quality of Study:     Adequate   Measurements                        Normal  Values                      Normal Values   LV           5.  3.5 - 5.6    Fractional    25       28 - 42%  Diastole     4        cm           Shortening  LV Systole   4        2.2 - 4.3    Aortic  Root  3.       2.0 - 3.7                        cm                         4        cm  LV Post.     1        0.7 - 1.2    Left Atrium   3.       1.9 - 4.0  Wall                  cm                         3        cm  Diastole  LV IVS        1        0.7 - 1.2    Right                  1.0 - 2.6  Diastole              cm           Ventricle              cm  Thickness  LV IVS                0.7 - 1.2    Ejection      55%        53% - 80%  Systole               cm           Fraction  Thickness                          (Rest)  LV Mass    Interpretation:  Clinical Diagnoses  and Echocardiographic  Findings     Normal  left ventricular  contractile function  (estimated  ejection     fraction  = 55%)  Descriptive Comments  Left Ventricle     There  is normal left ventricular  chamber  size, wall thickness  and     contraction.     The derived  left ventricular  ejection fraction  is 55%.     Abnormal  transmitral Doppler  flow profile  characteristic  of impaired     left ventricular  relaxation.  Mitral Valve     The mitral  valve is structurally  normal.     There  is trivial mitral  regurgitation by  color flow and  continuous     wave Doppler  examinations.  Left Atrium     The left  atrium is normal  in size.  Aortic Valve     The aortic  valve is trileaflet  with normal  excursion.     There  is no  evidence of  aortic regurgitation  by color flow  or     continuous  wave Doppler  examinations.     There  is no evidence of  a hemodynamically  important aortic     transvalvular  gradient;  the maximal instantaneous  left ventricular     outflow  velocity is 1.1  m/s.  Aorta     The thoracic  aorta is normal  in diameter  at the level of  the left     ventricular  outflow tract,  sinuses of Valsalva  and sinotubular     junction.  Pulmonary Artery     The pulmonary  artery is  not optimally imaged.  Pulmonary Valve     The pulmonary  valve is  normal.     There  is no detectable  pulmonary regurgitation  by color  flow Doppler     imaging.     There  is no evidence of  a hemodynamically  important pulmonary     transvalvular  gradient;  the maximal instantaneous  right ventricular     outflow   velocity is approximately  1.0 m/s.  Right Ventricle     There  is normal right ventricular  chamber  size and contraction.  Tricuspid Valve     The tricuspid  valve is  structurally normal.     There  is trivial tricuspid  regurgitation  by color flow and  continuous     wave Doppler  imaging; the  peak regurgitant  velocity is not  well-     defined.  Right Atrium     The right  atrium is normal  in size.  Inferior Vena Cava     The inferior  vena cava  is normal in size  with physiological  phasic     respiratory  change characteristic  of normal  central venous  and right     atrial  pressures.  Pericardium     There  is no evidence of  pericardial effusion.          ____________________________________________________________________                                Marca Ancona,  MD        electronically  signed  on 03/30/2015 11:46:37  AM with status  of Final   Status    EXAM: CT Chest, Abdomen & Pelvis with contrast  DATE: 03/23/15 15:34:30 ACCESSION: [id number] DICTATED: 03/23/15 16:14:18 INTERPRETATION LOCATION: [place]  CLINICAL INDICATION: 49 Year Old (F): C50.919 - Malignant neoplasm of female breast, unspecified laterality, unspecified site of breast (RAF-HCC)C50.919 - Malignant neoplasm of female breast, unspecified laterality, unspecified site of breast (RAF-HCC). OTHER    COMPARISON: Outside PET dated 02/27/2015.  TECHNIQUE: A spiral CT scan was obtained with IV contrast from the thoracic inlet through the pubic symphysis. Images were reconstructed in the axial plane.  Coronal and sagittal reformatted images were also provided for further evaluation.  FINDINGS:  CHEST: Right-sided portacatheter is in the right atrium. Postsurgical changes in both breasts with bilateral prostheses in place. Surgical clips in the left axilla. No axillary lymphadenopathy. There is an enlarged lymph node in the superior mediastinum on the left  measuring 2.6 x 2.5 cm. Right hilar lymph node measures 1.5 cm short axis. There are multiple small calcified mediastinal lymph nodes. Small left pleural effusion.  Multiple bilateral pulmonary nodules. One nodule in the left upper lobe measures 1.0 cm. There is nodularity along the left pleural surface. Diffuse prominence of the pulmonary interstitium with septal thickening.  ABDOMEN and PELVIS: There is an infiltrating mass in the hepatic dome measuring approximately 6.5 x 5.9 cm. The spleen and adrenals are unremarkable. The gallbladder is collapsed. There is a mass in the pancreatic neck measuring 3.3 x 2.9 cm (image 25). Soft tissue encases the main portal vein and narrows it.  Symmetric nephrograms without obstruction or nephrolithiasis. Status post hysterectomy. The bladder is unremarkable.  No bowel obstruction or focal inflammation. Scattered colonic diverticula. No retroperitoneal or pelvic lymphadenopathy.  BONES: Degenerative changes in the spine without focal lesions.  IMPRESSION: 1. Enlarged superior mediastinal and right hilar lymph nodes. Multiple pulmonary metastases. Septal thickening and prominence of the pulmonary interstitium may represent lymphangitic spread of disease. 2. Infiltrating mass in the hepatic dome. 3. Mass in the pancreatic neck with encasement and narrowing of the main portal vein. 4. Nodularity along the left pleural surface with a small left pleural effusion.        ASSESSMENT: 49 y.o. [place] woman  (1)  status post left lumpectomy under the care of Dr. 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 2 of 23 lymph nodes were involved.  Margins were clear.  (2) status post adjuvant chemotherapy consisting of 6 q. three-week doses of docetaxel/ doxorubicin/ cyclophosphamide given between January 2007 and may of 2007, with last dose on 04/20/2006.  (3) status  post radiation therapy to the left breast, completed 07/11/2006  (4) began on tamoxifen in early August 2007 and continued until October 2009. She status post hysterectomy with bilateral salpingo-oophorectomy on 07/15/2008, and was started on letrozole in October of 2009.  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 PET and CT of the chest showed no evidence of metastasis in January 2011.  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 Margins were clear. Patient underwent concurrent left latissimus flap reconstruction and right implant reconstruction.  (7)  status post additional chemotherapy with one cycle of carboplatin/gemcitabine given on 03/11/2010. She then received 5 q. three-week cycles of IV CMF between 03/25/2010 and 06/17/2010.  (8) started on exemestane in January 2012 and continued until late November 2013 when she was hospitalized for apparent TIA at which time her exemestane was stopped and not resumed  (9) METASTATIC DISEASE: evidence of disease recurence noted on chest CT, liver MRI and PET scan in December 2014, with suspicious lung nodules, lymphadenopathy, and 3 lesions in the right lobe of the liver, but no evidence of bony disease and no evidence of brain involvement by brain MRI September 2014. Biopsy of a left supraclavicular lymph node on 12/11/2013 confirmed metastatic carcinoma, estrogen receptor 45% positive, progesterone receptor 17% positive, with no HER-2 amplification.  (10) fulvestrant started 12/03/2013, discontinued 03/25/2014 with progression  (11) exemestane/ everolimus started 04/04/2014; everolimus held 04/14/2014, with skin rash and  mouth soreness; resumed at 5 mg a day as of 04/29/2014, discontinued 05/09/2014 with similar symptoms  (12) continueing exemestane, adding palbociclib 05/26/2014;   (a) palbociclib dose dropped  to 75 mg every other day for 21 days beginning with cycle 4  (b) exemestane and Palbociclib discontinued December 2015 with evidence of progression  (13) [facility] referral placed 04/28/2049 -- re-sent 05/23/2014-- shows Pi3KCA and TP53 mutations  (a) [facility] one test requested 12/10/2014-- confirms [employer] results  (b) did not qualify for capecitabine/ BYL 719 trial because of elevated lipase  (14) started eribulin 12/26/2014, stopped 01/23/2015 after 2 cycles because of neuropathy; repeat liver MRI 02/25/2015 shows progression   (15) capecitabine started 03/23/2015, initially 2 weeks 1 week off, then every other week starting with cycle 2   PLAN:  Taralynn has not been feeling well with her current regimen. I consulted with [name], and the plan is to drop the dose to 1.5g BID every other week. She will hold off on starting the next cycle until her diarrhea has completely resolved. When it returns it should be better able to be controlled with the combination of imodium, lomotil, and questran. 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    In the meantime, Kristin Hoffman will continue her efforts to stay hydrated.   Kristin Hoffman will return in 2 weeks for labs and a follow up visit. She understands and agrees with this plan. She knows the goal of treatment in her case is control. She has been encouraged to call with any issues that might arise before her next visit here.    Laurie Panda, NP   04/24/2015 3:41 PM

## 2015-04-25 ENCOUNTER — Other Ambulatory Visit: Payer: Self-pay | Admitting: Oncology

## 2015-04-27 ENCOUNTER — Other Ambulatory Visit: Payer: Self-pay | Admitting: *Deleted

## 2015-05-08 ENCOUNTER — Encounter: Payer: Self-pay | Admitting: Nurse Practitioner

## 2015-05-08 ENCOUNTER — Other Ambulatory Visit (HOSPITAL_BASED_OUTPATIENT_CLINIC_OR_DEPARTMENT_OTHER): Payer: 59

## 2015-05-08 ENCOUNTER — Ambulatory Visit (HOSPITAL_BASED_OUTPATIENT_CLINIC_OR_DEPARTMENT_OTHER): Payer: 59 | Admitting: Nurse Practitioner

## 2015-05-08 ENCOUNTER — Other Ambulatory Visit: Payer: Self-pay | Admitting: *Deleted

## 2015-05-08 VITALS — BP 126/90 | HR 97 | Temp 98.4°F | Resp 18 | Ht 63.0 in | Wt 177.9 lb

## 2015-05-08 DIAGNOSIS — C770 Secondary and unspecified malignant neoplasm of lymph nodes of head, face and neck: Secondary | ICD-10-CM

## 2015-05-08 DIAGNOSIS — C50919 Malignant neoplasm of unspecified site of unspecified female breast: Secondary | ICD-10-CM

## 2015-05-08 DIAGNOSIS — L271 Localized skin eruption due to drugs and medicaments taken internally: Secondary | ICD-10-CM | POA: Diagnosis not present

## 2015-05-08 DIAGNOSIS — R51 Headache: Secondary | ICD-10-CM

## 2015-05-08 DIAGNOSIS — C50312 Malignant neoplasm of lower-inner quadrant of left female breast: Secondary | ICD-10-CM | POA: Diagnosis not present

## 2015-05-08 DIAGNOSIS — R519 Headache, unspecified: Secondary | ICD-10-CM

## 2015-05-08 DIAGNOSIS — C50912 Malignant neoplasm of unspecified site of left female breast: Secondary | ICD-10-CM

## 2015-05-08 DIAGNOSIS — Z8673 Personal history of transient ischemic attack (TIA), and cerebral infarction without residual deficits: Secondary | ICD-10-CM

## 2015-05-08 LAB — COMPREHENSIVE METABOLIC PANEL (CC13)
ALK PHOS: 97 U/L (ref 40–150)
ALT: 14 U/L (ref 0–55)
AST: 42 U/L — ABNORMAL HIGH (ref 5–34)
Albumin: 3.3 g/dL — ABNORMAL LOW (ref 3.5–5.0)
Anion Gap: 10 mEq/L (ref 3–11)
BILIRUBIN TOTAL: 1.01 mg/dL (ref 0.20–1.20)
BUN: 12 mg/dL (ref 7.0–26.0)
CALCIUM: 8.8 mg/dL (ref 8.4–10.4)
CO2: 25 mEq/L (ref 22–29)
Chloride: 105 mEq/L (ref 98–109)
Creatinine: 0.9 mg/dL (ref 0.6–1.1)
EGFR: 74 mL/min/{1.73_m2} — ABNORMAL LOW (ref >90)
Glucose: 109 mg/dl (ref 70–140)
Potassium: 3.7 mEq/L (ref 3.5–5.1)
SODIUM: 140 meq/L (ref 136–145)
Total Protein: 6 g/dL — ABNORMAL LOW (ref 6.4–8.3)

## 2015-05-08 LAB — CBC WITH DIFFERENTIAL/PLATELET
BASO%: 0.9 % (ref 0.0–2.0)
Basophils Absolute: 0 10*3/uL (ref 0.0–0.1)
EOS ABS: 0.3 10*3/uL (ref 0.0–0.5)
EOS%: 5.3 % (ref 0.0–7.0)
HEMATOCRIT: 37.5 % (ref 34.8–46.6)
HEMOGLOBIN: 12.2 g/dL (ref 11.6–15.9)
LYMPH%: 19.4 % (ref 14.0–49.7)
MCH: 29.5 pg (ref 25.1–34.0)
MCHC: 32.5 g/dL (ref 31.5–36.0)
MCV: 90.6 fL (ref 79.5–101.0)
MONO#: 0.5 10*3/uL (ref 0.1–0.9)
MONO%: 10.2 % (ref 0.0–14.0)
NEUT#: 3.2 10*3/uL (ref 1.5–6.5)
NEUT%: 64.2 % (ref 38.4–76.8)
PLATELETS: 238 10*3/uL (ref 145–400)
RBC: 4.14 10*6/uL (ref 3.70–5.45)
RDW: 19.2 % — ABNORMAL HIGH (ref 11.2–14.5)
WBC: 5 10*3/uL (ref 3.9–10.3)
lymph#: 1 10*3/uL (ref 0.9–3.3)

## 2015-05-08 MED ORDER — TRAMADOL HCL 50 MG PO TABS: 50.0000 mg | ORAL_TABLET | Freq: Four times a day (QID) | ORAL | Status: DC | PRN

## 2015-05-08 NOTE — Progress Notes (Signed)
ID: Kristin Hoffman OB: 1966-04-12  MR#: [id number]  WUJ#:[id number]  PCP: Drake Leach, MD GYN:  [name] SU: [name] OTHER MD: [name], [name]  CHIEF COMPLAINT:  Stage IV breast cancer  CURRENT TREATMENT: eribulin  BREAST CANCER HISTORY: This patient was previously followed by [name], and was transferred to [name] service as of 08/20/2013.  At the age of 49, the patient had a screening mammogram as a baseline. She had recently given birth and was on birth control pills at that time. The mammogram in November 2006 showed an area of architectural distortion in the left lower inner quadrant. Subsequently an ultrasound was obtained of the left breast showing a 2.0 x 1.5 x 1.4 cm mass, at the 8:00 position, 4 cm from the nipple. A core biopsy performed on 10/21/2005 showed an invasive mammary carcinoma, ER +70%, PR +41%, HER-2/neu negative, with proliferation marker of 38%. [phone])  Breast MRI on 11/08/2005 confirmed a 2.5 cm spiculated mass in the left lower inner quadrant with 3 small satellite nodules adjacent to the primary mass: 3.5 cm posterior medial to the primary mass, measuring 9 mm; 1.5 cm anterior medial to the primary mass measuring 5 mm; and 2 cm medial to the primary mass measuring 5 mm. No axillary adenopathy was noted. No suspicious masses or enhancement are noted in the right breast.  Clarivel underwentt left lumpectomy under the care of Dr. 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 2 of 23 lymph nodes were involved.  Margins were clear.  She received adjuvant chemotherapy consisting of 6 q. three-week doses of docetaxel/doxorubicin/cyclophosphamide given between January 2007 and may of 2007, with last dose on 04/20/2006. She underwent radiation therapy completed 07/11/2006, after which she began on tamoxifen in early August 2007.  She  underwent hysterectomy with bilateral salpingo-oophorectomy on 07/15/2008, and was started on letrozole in October of 2009.  A mammogram 11/10/2009 showed microcalcifications in the left breast, and a subsequent biopsy on 11/18/2009 confirmed invasive mammary carcinoma in the left breast. PET and CT of the chest showed no evidence of metastasis in January 2011.  She underwent bilateral mastectomies 01/25/2010, with the right breast showing no evidence of malignancy; in the left breast showing a 1.0 cm grade 2 invasive ductal carcinoma with high-grade DCIS. Tumor was ER +22%, PR negative, HER-2/neu negative, with proliferation marker of 13%. Margins were clear. Patient underwent concurrent left latissimus flap reconstruction and right implant reconstruction.  She received additional chemotherapy with one cycle of carboplatin/gemcitabine given on 03/11/2010. She then received 5 q. three-week cycles of IV CMF between 03/25/2010 and 06/17/2010.  She was started on exemestane in January 2012 and continued until late November 2013 when she was hospitalized for apparent TIA.   Her subsequent history is as detailed below  INTERVAL HISTORY: Kristin Hoffman returns today for followup of her metastatic breast cancer. At her last visit, we dropped her capecitabine to 1.5g BID, 1 week on 1 week off. She is tolerating this much better. She has had absolutely no diarrhea. On the weeks she is on treatment, she does have the pain and erythema to her palms and soles, just as she did before. However, on her weeks off, there is none. There is some mild peeling at her finger tips. This is consistent with palmar-plantar erythrodysesthesia, which is reversible.    REVIEW OF SYSTEMS: Monquie denies fevers or chills. Her  nausea is minimal. She is chronically fatigued, but sleeps well at night. She is back on lexapro and this is more helpful to her than the zoloft was. Occasionally her gums are sore, but there are no lesions present. A  detailed review of systems is otherwise stable.  PAST MEDICaL HISTORY: Past Medical History  Diagnosis Date  . Breast cancer   . Depression   . Reflux   . Cancer   . Hx-TIA (transient ischemic attack) 11/22/2013  . Chronic headaches 11/22/2013  . Anxiety 11/22/2013  . Breast cancer metastasized to multiple sites 12/24/2013    PAST SURGICAL HISTORY: Past Surgical History  Procedure Laterality Date  . Mastectomy w/ nodes partial    . Mastectomy    . Breast reconstruction    . Portacath placement      x 2  . Portacath removal      x 2  . Abdominal hysterectomy    . Tee without cardioversion  11/12/2012    Procedure: TRANSESOPHAGEAL ECHOCARDIOGRAM (TEE);  Surgeon: Candee Furbish, MD;  Location: [facility] ENDOSCOPY;  Service: Cardiovascular;  Laterality: N/A;  This TEE may be Dr. Marlou Porch or a LHC Dr    FAMILY HISTORY Both parents are alive and well. Patient has one sister who is 76 years younger and is in good health. No other history of breast or ovarian cancer in the family. No family history on file.  GYNECOLOGIC HISTORY:   (Updated January 2015) G2P2, menarche at age 23 with irregular menses. On birth control pills in the past. On Clomid to induce ovulation with first pregnancy. Also had preeclampsia with first pregnancy. Status post hysterectomy and bilateral salpingo-oophorectomy in August 2009.  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 She is originally from [place]. She's been married to [place], for 13 years. He works as an Pharmacist, hospital at [employer].   ADVANCED DIRECTIVES:  Not in place; the patient's husband is her HCPOA  HEALTH MAINTENANCE:  (Updated 11/22/2013) History  Substance Use Topics  . Smoking status: Never Smoker   . Smokeless tobacco: Never Used  . Alcohol Use: Yes     Comment: rarely     Colonoscopy:  Never  PAP: S/P LAVH/BSO in August 2009  Bone density:  Never  Lipid panel: Not on file   Allergies  Allergen Reactions  . Afinitor [Everolimus] Hives    Current Outpatient Prescriptions  Medication Sig Dispense Refill  . acidophilus (RISAQUAD) CAPS capsule Take 1 capsule by mouth daily.    Marland Kitchen aspirin 81 MG tablet Take 81 mg by mouth daily.    . Biotin 5000 MCG CAPS Take 1 capsule (5,000 mcg total) by mouth daily. 30 capsule   . capecitabine (XELODA) 500 MG tablet Take 2,000 mg by mouth.    . cetirizine (ZYRTEC) 10 MG tablet Take 10 mg by mouth daily. Pt takes this med at night    . Cholecalciferol 2000 UNITS CHEW Chew 1 tablet by mouth daily.    Marland Kitchen escitalopram (LEXAPRO) 20 MG tablet Take 20 mg by mouth daily with breakfast.     . Ibuprofen (ADVIL) 200 MG CAPS Take 2 capsules (400 mg total) by mouth 3 (three) times daily with meals as needed. 120 each 0  . lidocaine-prilocaine (EMLA) cream Apply to affected area once 30 g 3  . LORazepam (ATIVAN) 0.5 MG tablet TAKE 1 TABLET BY MOUTH AS NEEDED FOR NAUSEA AND  VOMITING AS DIRECTED 30 tablet 0  . Melatonin 10 MG TABS Take 1 tablet by mouth at bedtime.    . montelukast (SINGULAIR) 10 MG tablet Take 10 mg by mouth daily. Pt takes this med in the morning.    . Multiple Vitamin (MULTIVITAMIN) tablet Take 1 tablet by mouth daily.    Marland Kitchen omeprazole (PRILOSEC) 40 MG capsule Take 40 mg by mouth daily.     . traMADol (ULTRAM) 50 MG tablet Take 1 tablet (50 mg total) by mouth every 6 (six) hours as needed. 30 tablet 3  . cholestyramine (QUESTRAN) 4 G packet Take 1 packet (4 g total) by mouth 3 (three) times daily with meals. (Patient not taking: Reported on 05/08/2015) 60 each 12  . cyclobenzaprine (FLEXERIL) 5 MG tablet   1  . diphenoxylate-atropine (LOMOTIL) 2.5-0.025 MG per tablet TAKE 2 TABLETS BY MOUTH 4 TIMES A DAY AS NEEDED FOR DIARRHEA OR LOOSE STOOLS (Patient not taking: Reported on 05/08/2015) 30 tablet 0  . fluconazole (DIFLUCAN) 100 MG tablet     . metoCLOPramide (REGLAN) 5 MG tablet Take 10 mg by  mouth 4 (four) times daily.    Marland Kitchen nystatin (MYCOSTATIN) 100000 UNIT/ML suspension Take 5 mLs (500,000 Units total) by mouth 4 (four) times daily. (Patient not taking: Reported on 05/08/2015) 240 mL 1  . ondansetron (ZOFRAN) 8 MG tablet     . prochlorperazine (COMPAZINE) 10 MG tablet      No current facility-administered medications for this visit.    OBJECTIVE: Young white woman who was tearful during today's visit Filed Vitals:   05/08/15 1312  BP: 126/90  Pulse: 97  Temp: 98.4 F (36.9 C)  Resp: 18  Body mass index is 31.52 kg/(m^2).  ECOG: 2 Filed Weights   05/08/15 1312  Weight: 177 lb 14.4 oz (80.695 kg)   Light peeling to  Sclerae unicteric, pupils round and equal Oropharynx clear and moist-- no thrush or other lesions No cervical or supraclavicular adenopathy Lungs no rales or rhonchi Heart regular rate and rhythm Abd soft, nontender, positive bowel sounds MSK no focal spinal tenderness, no upper extremity lymphedema Neuro: nonfocal, well oriented, appropriate affect Breasts: deferred  LAB RESULTS:   Lab Results  Component Value Date   WBC 5.0 05/08/2015   NEUTROABS 3.2 05/08/2015   HGB 12.2 05/08/2015   HCT 37.5 05/08/2015   MCV 90.6 05/08/2015   PLT 238 05/08/2015      Chemistry      Component Value Date/Time   NA 140 05/08/2015 1303   NA 142 11/12/2012 0735   K 3.7 05/08/2015 1303   K 3.8 11/12/2012 0735   CL 104 11/14/2012 1407   CL 104 11/12/2012 0735   CO2 25 05/08/2015 1303   CO2 30 11/12/2012 0735   BUN 12.0 05/08/2015 1303   BUN 13 11/12/2012 0735   CREATININE 0.9 05/08/2015 1303   CREATININE 0.97 11/12/2012 0735      Component Value Date/Time   CALCIUM 8.8 05/08/2015 1303   CALCIUM 9.1 11/12/2012 0735   ALKPHOS 97 05/08/2015 1303   ALKPHOS 69 11/12/2012 0735   AST 42* 05/08/2015 1303   AST 16 11/12/2012 0735   ALT 14 05/08/2015 1303   ALT 13 11/12/2012 0735   BILITOT 1.01 05/08/2015 1303   BILITOT 0.9 11/12/2012 0735       STUDIES:  Final                     Echocardiography  Report-Transthoracic                             Study  Date: 03/23/2015  Patient Information   Patient:      ALZINA, GOLDA         MAU:[id number]-  Address:      [address], Zip:      [place]  [address]  Telephone:      [phone]  Date of Birth:           1966/05/08       Sex: Female  Sonographer:  Alisa Graff       Laboratory  MD:  Referring MD: Verlon Setting    Interpreting  MD: Marca Ancona Baseline Data   Age:                85  Height:       160  cm               Heart  Rate:     87  Weight:       87.  kg               Rhythm:    NSR  BSA:          1.9                   BP Resting:      114/77  Inpatient:    Outpatient            Scheduling  Type: Routine  Study Data    Case Number: [id number]                 RoomNumber:   OP   Date of Test:                      4/11/2016Study   End Time: 01:15  PM Indication(s):            Pre-chemotherapy    CPT Code      Description   93306         Resting transthoracic  echocardiography  with                 continous and pulsed  wave doppler  and color                 flow imaging complete  Quality of Study:     Adequate   Measurements                        Normal  Values                      Normal Values   LV           5.       3.5 - 5.6    Fractional    25       28 - 42%  Diastole     4        cm           Shortening  LV Systole   4  2.2 - 4.3    Aortic  Root  3.       2.0 - 3.7                        cm                         4        cm  LV Post.     1        0.7 - 1.2    Left Atrium   3.       1.9 - 4.0  Wall                  cm                         3        cm  Diastole  LV IVS       1        0.7 - 1.2    Right                  1.0 - 2.6  Diastole              cm           Ventricle              cm  Thickness  LV IVS                0.7 - 1.2    Ejection      55%         53% - 80%  Systole               cm           Fraction  Thickness                          (Rest)  LV Mass    Interpretation:  Clinical Diagnoses  and Echocardiographic  Findings     Normal  left ventricular  contractile function  (estimated  ejection     fraction  = 55%)  Descriptive Comments  Left Ventricle     There  is normal left ventricular  chamber  size, wall thickness  and     contraction.     The derived  left ventricular  ejection fraction  is 55%.     Abnormal  transmitral Doppler  flow profile  characteristic  of impaired     left ventricular  relaxation.  Mitral Valve     The mitral  valve is structurally  normal.     There  is trivial mitral  regurgitation by  color flow and  continuous     wave Doppler  examinations.  Left Atrium     The left  atrium is normal  in size.  Aortic Valve     The aortic  valve is trileaflet  with normal  excursion.     There  is no evidence of  aortic regurgitation  by color flow  or     continuous  wave Doppler  examinations.     There  is no evidence of  a hemodynamically  important aortic     transvalvular  gradient;  the maximal instantaneous  left ventricular     outflow  velocity is  1.1  m/s.  Aorta     The thoracic  aorta is normal  in diameter  at the level of  the left     ventricular  outflow tract,  sinuses of Valsalva  and sinotubular     junction.  Pulmonary Artery     The pulmonary  artery is  not optimally imaged.  Pulmonary Valve     The pulmonary  valve is  normal.     There  is no detectable  pulmonary regurgitation  by color  flow Doppler     imaging.     There  is no evidence of  a hemodynamically  important pulmonary     transvalvular  gradient;  the maximal instantaneous  right ventricular     outflow  velocity is approximately  1.0 m/s.  Right Ventricle     There  is normal right ventricular  chamber  size and contraction.  Tricuspid Valve     The tricuspid  valve is  structurally  normal.     There  is trivial tricuspid  regurgitation  by color flow and  continuous     wave Doppler  imaging; the  peak regurgitant  velocity is not  well-     defined.  Right Atrium     The right  atrium is normal  in size.  Inferior Vena Cava     The inferior  vena cava  is normal in size  with physiological  phasic     respiratory  change characteristic  of normal  central venous  and right     atrial  pressures.  Pericardium     There  is no evidence of  pericardial effusion.          ____________________________________________________________________                                Marca Ancona,  MD        electronically  signed  on 03/30/2015 11:46:37  AM with status  of Final   Status    EXAM: CT Chest, Abdomen & Pelvis with contrast  DATE: 03/23/15 15:34:30 ACCESSION: [id number] UN DICTATED: 03/23/15 16:14:18 INTERPRETATION LOCATION: [facility]  CLINICAL INDICATION: 49 Year Old (F): C50.919 - Malignant neoplasm of female breast, unspecified laterality, unspecified site of breast (RAF-HCC)C50.919 - Malignant neoplasm of female breast, unspecified laterality, unspecified site of breast (RAF-HCC). OTHER    COMPARISON: Outside PET dated 02/27/2015.  TECHNIQUE: A spiral CT scan was obtained with IV contrast from the thoracic inlet through the pubic symphysis. Images were reconstructed in the axial plane.  Coronal and sagittal reformatted images were also provided for further evaluation.  FINDINGS:  CHEST: Right-sided portacatheter is in the right atrium. Postsurgical changes in both breasts with bilateral prostheses in place. Surgical clips in the left axilla. No axillary lymphadenopathy. There is an enlarged lymph node in the superior mediastinum on the left measuring 2.6 x 2.5 cm. Right hilar lymph node measures 1.5 cm short axis. There are multiple small calcified mediastinal lymph nodes. Small left pleural effusion.  Multiple bilateral pulmonary  nodules. One nodule in the left upper lobe measures 1.0 cm. There is nodularity along the left pleural surface. Diffuse prominence of the pulmonary interstitium with septal thickening.  ABDOMEN and PELVIS: There is an infiltrating mass in the hepatic dome measuring approximately 6.5 x 5.9 cm. The spleen and adrenals are unremarkable. The gallbladder is  collapsed. There is a mass in the pancreatic neck measuring 3.3 x 2.9 cm (image 25). Soft tissue encases the main portal vein and narrows it.  Symmetric nephrograms without obstruction or nephrolithiasis. Status post hysterectomy. The bladder is unremarkable.  No bowel obstruction or focal inflammation. Scattered colonic diverticula. No retroperitoneal or pelvic lymphadenopathy.  BONES: Degenerative changes in the spine without focal lesions.  IMPRESSION: 1. Enlarged superior mediastinal and right hilar lymph nodes. Multiple pulmonary metastases. Septal thickening and prominence of the pulmonary interstitium may represent lymphangitic spread of disease. 2. Infiltrating mass in the hepatic dome. 3. Mass in the pancreatic neck with encasement and narrowing of the main portal vein. 4. Nodularity along the left pleural surface with a small left pleural effusion.        ASSESSMENT: 49 y.o. [place] woman  (1)  status post left lumpectomy under the care of Dr. 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 2 of 23 lymph nodes were involved.  Margins were clear.  (2) status post adjuvant chemotherapy consisting of 6 q. three-week doses of docetaxel/ doxorubicin/ cyclophosphamide given between January 2007 and may of 2007, with last dose on 04/20/2006.  (3) status post radiation therapy to the left breast, completed 07/11/2006  (4) began on tamoxifen in early August 2007 and continued until October 2009. She status post hysterectomy with bilateral  salpingo-oophorectomy on 07/15/2008, and was started on letrozole in October of 2009.  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 PET and CT of the chest showed no evidence of metastasis in January 2011.  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 Margins were clear. Patient underwent concurrent left latissimus flap reconstruction and right implant reconstruction.  (7)  status post additional chemotherapy with one cycle of carboplatin/gemcitabine given on 03/11/2010. She then received 5 q. three-week cycles of IV CMF between 03/25/2010 and 06/17/2010.  (8) started on exemestane in January 2012 and continued until late November 2013 when she was hospitalized for apparent TIA at which time her exemestane was stopped and not resumed  (9) METASTATIC DISEASE: evidence of disease recurence noted on chest CT, liver MRI and PET scan in December 2014, with suspicious lung nodules, lymphadenopathy, and 3 lesions in the right lobe of the liver, but no evidence of bony disease and no evidence of brain involvement by brain MRI September 2014. Biopsy of a left supraclavicular lymph node on 12/11/2013 confirmed metastatic carcinoma, estrogen receptor 45% positive, progesterone receptor 17% positive, with no HER-2 amplification.  (10) fulvestrant started 12/03/2013, discontinued 03/25/2014 with progression  (11) exemestane/ everolimus started 04/04/2014; everolimus held 04/14/2014, with skin rash and mouth soreness; resumed at 5 mg a day as of 04/29/2014, discontinued 05/09/2014 with similar symptoms  (12) continueing exemestane, adding palbociclib 05/26/2014;   (a) palbociclib dose  dropped to 75 mg every other day for 21 days beginning with cycle 4  (b) exemestane and Palbociclib discontinued December 2015 with evidence of progression  (13) [facility] referral placed 04/28/2049 -- re-sent 05/23/2014-- shows Pi3KCA and TP53 mutations  (a) [facility] one test requested 12/10/2014-- confirms [employer] results  (b) did not qualify for capecitabine/ BYL 719 trial  because of elevated lipase  (14) started eribulin 12/26/2014, 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 Dropped from 2g BID to 1.5g BID starting on 04/26/15.     PLAN:  Ramsha is much improved on the lower dose of capecitabine. For her hand peeling, I have given her samples of Aquaphor ointment to apply, and suggested she pick up something like this. The labs were reviewed in detail and were entirely stable. She will continue on the 1.5g dose, and will start the next cycle this upcoming Sunday.   Paylin will return in 2 weeks for labs and a follow up visit. She understands and agrees with this plan. She knows the goal of treatment in her case is control. She has been encouraged to call with any issues that might arise before her next visit here   Laurie Panda, NP   05/08/2015 2:03 PM

## 2015-05-20 ENCOUNTER — Encounter: Payer: Self-pay | Admitting: Skilled Nursing Facility1

## 2015-05-20 NOTE — Progress Notes (Signed)
Subjective:     Patient ID: Kristin Hoffman, female   DOB: Dec 11, 1966, 49 y.o.   MRN: [id number]  HPI   Review of Systems     Objective:   Physical Exam To assist the pt in identifying dietary strategies to gain some lost wt back.    Assessment:     Pt identified as being malnourished due to some lost wt. Pt was contacted via the telephone at [phone]. Pt was unavailable.     Plan:     Dietitian left a voicemail encouraging the pt to call Ernestene Kiel CSO, RD,LDN at [phone] at their earliest convenience.

## 2015-05-21 ENCOUNTER — Ambulatory Visit (HOSPITAL_BASED_OUTPATIENT_CLINIC_OR_DEPARTMENT_OTHER): Payer: 59 | Admitting: Nurse Practitioner

## 2015-05-21 ENCOUNTER — Other Ambulatory Visit (HOSPITAL_BASED_OUTPATIENT_CLINIC_OR_DEPARTMENT_OTHER): Payer: 59

## 2015-05-21 ENCOUNTER — Encounter: Payer: Self-pay | Admitting: Nurse Practitioner

## 2015-05-21 VITALS — BP 118/81 | HR 118 | Temp 98.3°F | Resp 19 | Ht 63.0 in | Wt 178.1 lb

## 2015-05-21 DIAGNOSIS — C50312 Malignant neoplasm of lower-inner quadrant of left female breast: Secondary | ICD-10-CM

## 2015-05-21 DIAGNOSIS — C50912 Malignant neoplasm of unspecified site of left female breast: Secondary | ICD-10-CM

## 2015-05-21 DIAGNOSIS — R519 Headache, unspecified: Secondary | ICD-10-CM

## 2015-05-21 DIAGNOSIS — L271 Localized skin eruption due to drugs and medicaments taken internally: Secondary | ICD-10-CM | POA: Diagnosis not present

## 2015-05-21 DIAGNOSIS — Z8673 Personal history of transient ischemic attack (TIA), and cerebral infarction without residual deficits: Secondary | ICD-10-CM

## 2015-05-21 DIAGNOSIS — C760 Malignant neoplasm of head, face and neck: Secondary | ICD-10-CM

## 2015-05-21 DIAGNOSIS — R0602 Shortness of breath: Secondary | ICD-10-CM | POA: Diagnosis not present

## 2015-05-21 DIAGNOSIS — C50919 Malignant neoplasm of unspecified site of unspecified female breast: Secondary | ICD-10-CM

## 2015-05-21 DIAGNOSIS — R51 Headache: Secondary | ICD-10-CM

## 2015-05-21 LAB — CBC WITH DIFFERENTIAL/PLATELET
BASO%: 0.2 % (ref 0.0–2.0)
BASOS ABS: 0 10*3/uL (ref 0.0–0.1)
EOS%: 3.1 % (ref 0.0–7.0)
Eosinophils Absolute: 0.2 10*3/uL (ref 0.0–0.5)
HCT: 39.1 % (ref 34.8–46.6)
HEMOGLOBIN: 12.6 g/dL (ref 11.6–15.9)
LYMPH#: 1 10*3/uL (ref 0.9–3.3)
LYMPH%: 15.6 % (ref 14.0–49.7)
MCH: 29.7 pg (ref 25.1–34.0)
MCHC: 32.2 g/dL (ref 31.5–36.0)
MCV: 92.2 fL (ref 79.5–101.0)
MONO#: 0.7 10*3/uL (ref 0.1–0.9)
MONO%: 11.1 % (ref 0.0–14.0)
NEUT%: 70 % (ref 38.4–76.8)
NEUTROS ABS: 4.5 10*3/uL (ref 1.5–6.5)
Platelets: 255 10*3/uL (ref 145–400)
RBC: 4.24 10*6/uL (ref 3.70–5.45)
RDW: 20.5 % — AB (ref 11.2–14.5)
WBC: 6.4 10*3/uL (ref 3.9–10.3)

## 2015-05-21 LAB — COMPREHENSIVE METABOLIC PANEL (CC13)
ALBUMIN: 2.9 g/dL — AB (ref 3.5–5.0)
ALK PHOS: 88 U/L (ref 40–150)
ALT: 13 U/L (ref 0–55)
ANION GAP: 8 meq/L (ref 3–11)
AST: 45 U/L — AB (ref 5–34)
BILIRUBIN TOTAL: 1.21 mg/dL — AB (ref 0.20–1.20)
BUN: 11.3 mg/dL (ref 7.0–26.0)
CALCIUM: 8.9 mg/dL (ref 8.4–10.4)
CHLORIDE: 105 meq/L (ref 98–109)
CO2: 28 mEq/L (ref 22–29)
Creatinine: 0.8 mg/dL (ref 0.6–1.1)
EGFR: >90 mL/min/{1.73_m2} (ref >90)
GLUCOSE: 114 mg/dL (ref 70–140)
POTASSIUM: 3.8 meq/L (ref 3.5–5.1)
SODIUM: 141 meq/L (ref 136–145)
TOTAL PROTEIN: 5.7 g/dL — AB (ref 6.4–8.3)

## 2015-05-21 NOTE — Progress Notes (Signed)
ID: Kristin Hoffman OB: [birth year]  MR#: [id number]  URK#:[id number]  PCP: Drake Leach, MD GYN:  [name] SU: [name] OTHER MD: [name], [name]  CHIEF COMPLAINT:  Stage IV breast cancer  CURRENT TREATMENT: eribulin  BREAST CANCER HISTORY: This patient was previously followed by [name], and was transferred to [name] service as of 08/20/2013.  At the age of 31, the patient had a screening mammogram as a baseline. She had recently given birth and was on birth control pills at that time. The mammogram in November 2006 showed an area of architectural distortion in the left lower inner quadrant. Subsequently an ultrasound was obtained of the left breast showing a 2.0 x 1.5 x 1.4 cm mass, at the 8:00 position, 4 cm from the nipple. A core biopsy performed on 10/21/2005 showed an invasive mammary carcinoma, ER +70%, PR +41%, HER-2/neu negative, with proliferation marker of 38%. [phone])  Breast MRI on 11/08/2005 confirmed a 2.5 cm spiculated mass in the left lower inner quadrant with 3 small satellite nodules adjacent to the primary mass: 3.5 cm posterior medial to the primary mass, measuring 9 mm; 1.5 cm anterior medial to the primary mass measuring 5 mm; and 2 cm medial to the primary mass measuring 5 mm. No axillary adenopathy was noted. No suspicious masses or enhancement are noted in the right breast.  Kristin Hoffman left lumpectomy under the care of Dr. 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 2 of 23 lymph nodes were involved.  Margins were clear.  She received adjuvant chemotherapy consisting of 6 q. three-week doses of docetaxel/doxorubicin/cyclophosphamide given between January 2007 and may of 2007, with last dose on 04/20/2006. She underwent radiation therapy completed 07/11/2006, after which she began on tamoxifen in early August 2007.  She  underwent hysterectomy with bilateral salpingo-oophorectomy on 07/15/2008, and was started on letrozole in October of 2009.  A mammogram 11/10/2009 showed microcalcifications in the left breast, and a subsequent biopsy on 11/18/2009 confirmed invasive mammary carcinoma in the left breast. PET and CT of the chest showed no evidence of metastasis in January 2011.  She underwent bilateral mastectomies 01/25/2010, with the right breast showing no evidence of malignancy; in the left breast showing a 1.0 cm grade 2 invasive ductal carcinoma with high-grade DCIS. Tumor was ER +22%, PR negative, HER-2/neu negative, with proliferation marker of 13%. Margins were clear. Patient underwent concurrent left latissimus flap reconstruction and right implant reconstruction.  She received additional chemotherapy with one cycle of carboplatin/gemcitabine given on 03/11/2010. She then received 5 q. three-week cycles of IV CMF between 03/25/2010 and 06/17/2010.  She was started on exemestane in January 2012 and continued until late November 2013 when she was hospitalized for apparent TIA.   Her subsequent history is as detailed below  INTERVAL HISTORY: Kristin Hoffman returns today for follow up of her metastatic breast cancer. This is her off week from the 4th cycle of capecitabine which is down to 1.5g BID. She continues to improve in terms of side effects with this lower dose. She had had no diarrhea. The peeling continues to her hands and feet, but there is no associated pain now. She has not been keeping them well moisturized as advised at her last visit. She is tearful during out visit. Her left supraclavicular area is now full and tender when compare to the right side. She is worried that the  cancer is progressing.    REVIEW OF SYSTEMS: Kristin Hoffman denies fevers or chills. She hs minimal nausea. Her appetite is down but she is drinking well. New this past week or too is increased shortness of breath and dry cough. Her respirations  increase when lying down, but if she focuses on breathing she can calm it back down and sleep normally. Her HR is 120 today. Deep breaths are hard for her. A detailed review of systems is otherwise stable.  PAST MEDICaL HISTORY: Past Medical History  Diagnosis Date  . Breast cancer   . Depression   . Reflux   . Cancer   . Hx-TIA (transient ischemic attack) 11/22/2013  . Chronic headaches 11/22/2013  . Anxiety 11/22/2013  . Breast cancer metastasized to multiple sites 12/24/2013    PAST SURGICAL HISTORY: Past Surgical History  Procedure Laterality Date  . Mastectomy w/ nodes partial    . Mastectomy    . Breast reconstruction    . Portacath placement      x 2  . Portacath removal      x 2  . Abdominal hysterectomy    . Tee without cardioversion  11/12/2012    Procedure: TRANSESOPHAGEAL ECHOCARDIOGRAM (TEE);  Surgeon: Candee Furbish, MD;  Location: [facility] ENDOSCOPY;  Service: Cardiovascular;  Laterality: N/A;  This TEE may be Dr. Marlou Porch or a LHC Dr    FAMILY HISTORY Both parents are alive and well. Patient has one sister who is 43 years younger and is in good health. No other history of breast or ovarian cancer in the family. No family history on file.  GYNECOLOGIC HISTORY:   (Updated January 2015) G2P2, menarche at age 10 with irregular menses. On birth control pills in the past. On Clomid to induce ovulation with first pregnancy. Also had preeclampsia with first pregnancy. Status post hysterectomy and bilateral salpingo-oophorectomy in August 2009.  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 She is originally from [place]. She's been married to [place], for 13 years. He works as an Pharmacist, hospital at [employer].   ADVANCED DIRECTIVES:  Not in place; the patient's husband is her HCPOA  HEALTH MAINTENANCE:  (Updated 11/22/2013) History  Substance Use Topics  . Smoking status: Never Smoker    . Smokeless tobacco: Never Used  . Alcohol Use: Yes     Comment: rarely     Colonoscopy:  Never  PAP: S/P LAVH/BSO in August 2009  Bone density: Never  Lipid panel: Not on file   Allergies  Allergen Reactions  . Afinitor [Everolimus] Hives    Current Outpatient Prescriptions  Medication Sig Dispense Refill  . acidophilus (RISAQUAD) CAPS capsule Take 1 capsule by mouth daily.    Marland Kitchen aspirin 81 MG tablet Take 81 mg by mouth daily.    . Biotin 5000 MCG CAPS Take 1 capsule (5,000 mcg total) by mouth daily. 30 capsule   . capecitabine (XELODA) 500 MG tablet Take 1,500 mg by mouth 2 (two) times daily after a meal.     . cetirizine (ZYRTEC) 10 MG tablet Take 10 mg by mouth daily. Pt takes this med at night    . Cholecalciferol 2000 UNITS CHEW Chew 1 tablet by mouth daily.    . cyclobenzaprine (FLEXERIL) 5 MG tablet   1  . escitalopram (LEXAPRO) 20 MG tablet Take 20 mg by mouth daily with breakfast.     . fluconazole (DIFLUCAN)  100 MG tablet     . LORazepam (ATIVAN) 0.5 MG tablet TAKE 1 TABLET BY MOUTH AS NEEDED FOR NAUSEA AND VOMITING AS DIRECTED 30 tablet 0  . Melatonin 10 MG TABS Take 1 tablet by mouth at bedtime.    . montelukast (SINGULAIR) 10 MG tablet Take 10 mg by mouth daily. Pt takes this med in the morning.    . Multiple Vitamin (MULTIVITAMIN) tablet Take 1 tablet by mouth daily.    Marland Kitchen omeprazole (PRILOSEC) 40 MG capsule Take 40 mg by mouth daily.     . traMADol (ULTRAM) 50 MG tablet Take 1 tablet (50 mg total) by mouth every 6 (six) hours as needed. 30 tablet 3  . cholestyramine (QUESTRAN) 4 G packet Take 1 packet (4 g total) by mouth 3 (three) times daily with meals. (Patient not taking: Reported on 05/08/2015) 60 each 12  . diphenoxylate-atropine (LOMOTIL) 2.5-0.025 MG per tablet TAKE 2 TABLETS BY MOUTH 4 TIMES A DAY AS NEEDED FOR DIARRHEA OR LOOSE STOOLS (Patient not taking: Reported on 05/08/2015) 30 tablet 0  . Ibuprofen (ADVIL) 200 MG CAPS Take 2 capsules (400 mg total) by  mouth 3 (three) times daily with meals as needed. (Patient not taking: Reported on 05/21/2015) 120 each 0  . lidocaine-prilocaine (EMLA) cream Apply to affected area once (Patient not taking: Reported on 05/21/2015) 30 g 3  . metoCLOPramide (REGLAN) 5 MG tablet Take 10 mg by mouth 4 (four) times daily.    Marland Kitchen nystatin (MYCOSTATIN) 100000 UNIT/ML suspension Take 5 mLs (500,000 Units total) by mouth 4 (four) times daily. (Patient not taking: Reported on 05/08/2015) 240 mL 1  . ondansetron (ZOFRAN) 8 MG tablet     . prochlorperazine (COMPAZINE) 10 MG tablet      No current facility-administered medications for this visit.    OBJECTIVE: Young white woman who was tearful during today's visit Filed Vitals:   05/21/15 1311  BP: 118/81  Pulse: 118  Temp: 98.3 F (36.8 C)  Resp: 19  Body mass index is 31.56 kg/(m^2).  ECOG: 2 Filed Weights   05/21/15 1311  Weight: 178 lb 1.6 oz (80.786 kg)    Skin: warm, dry, peeling to bilateral heels and finger tips HEENT: sclerae anicteric, conjunctivae pink, oropharynx clear. No thrush or mucositis.  Lymph Nodes: left supraclavicular lymph node swollen and tender Lungs: clear to auscultation bilaterally, no rales, wheezes, or rhonci  Heart: regular rate and rhythm  Abdomen: round, soft, non tender, positive bowel sounds  Musculoskeletal: No focal spinal tenderness, no peripheral edema  Neuro: non focal, well oriented, positive affect  Breasts: deferred   LAB RESULTS:   Lab Results  Component Value Date   WBC 6.4 05/21/2015   NEUTROABS 4.5 05/21/2015   HGB 12.6 05/21/2015   HCT 39.1 05/21/2015   MCV 92.2 05/21/2015   PLT 255 05/21/2015      Chemistry      Component Value Date/Time   NA 141 05/21/2015 1255   NA 142 11/12/2012 0735   K 3.8 05/21/2015 1255   K 3.8 11/12/2012 0735   CL 104 11/14/2012 1407   CL 104 11/12/2012 0735   CO2 28 05/21/2015 1255   CO2 30 11/12/2012 0735   BUN 11.3 05/21/2015 1255   BUN 13 11/12/2012 0735    CREATININE 0.8 05/21/2015 1255   CREATININE 0.97 11/12/2012 0735      Component Value Date/Time   CALCIUM 8.9 05/21/2015 1255   CALCIUM 9.1 11/12/2012 0735   ALKPHOS 88  05/21/2015 1255   ALKPHOS 69 11/12/2012 0735   AST 45* 05/21/2015 1255   AST 16 11/12/2012 0735   ALT 13 05/21/2015 1255   ALT 13 11/12/2012 0735   BILITOT 1.21* 05/21/2015 1255   BILITOT 0.9 11/12/2012 0735      STUDIES: No results found.   ASSESSMENT: 49 y.o. [place] woman  (1)  status post left lumpectomy under the care of Dr. 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 2 of 23 lymph nodes were involved.  Margins were clear.  (2) status post adjuvant chemotherapy consisting of 6 q. three-week doses of docetaxel/ doxorubicin/ cyclophosphamide given between January 2007 and may of 2007, with last dose on 04/20/2006.  (3) status post radiation therapy to the left breast, completed 07/11/2006  (4) began on tamoxifen in early August 2007 and continued until October 2009. She status post hysterectomy with bilateral salpingo-oophorectomy on 07/15/2008, and was started on letrozole in October of 2009.  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 PET and CT of the chest showed no evidence of metastasis in January 2011.  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 Margins were clear. Patient underwent concurrent left latissimus flap reconstruction and right implant reconstruction.  (7)  status post additional chemotherapy with one cycle of carboplatin/gemcitabine given on 03/11/2010. She then  received 5 q. three-week cycles of IV CMF between 03/25/2010 and 06/17/2010.  (8) started on exemestane in January 2012 and continued until late November 2013 when she was hospitalized for apparent TIA at which time her exemestane was stopped and not resumed  (9) METASTATIC DISEASE: evidence of disease recurence noted on chest CT, liver MRI and PET scan in December 2014, with suspicious lung nodules, lymphadenopathy, and 3 lesions in the right lobe of the liver, but no evidence of bony disease and no evidence of brain involvement by brain MRI September 2014. Biopsy of a left supraclavicular lymph node on 12/11/2013 confirmed metastatic carcinoma, estrogen receptor 45% positive, progesterone receptor 17% positive, with no HER-2 amplification.  (10) fulvestrant started 12/03/2013, discontinued 03/25/2014 with progression  (11) exemestane/ everolimus started 04/04/2014; everolimus held 04/14/2014, with skin rash and mouth soreness; resumed at 5 mg a day as of 04/29/2014, discontinued 05/09/2014 with similar symptoms  (12) continueing exemestane, adding palbociclib 05/26/2014;   (a) palbociclib dose dropped to 75 mg every other day for 21 days beginning with cycle 4  (b) exemestane and Palbociclib discontinued December 2015 with evidence of progression  (13) [facility] referral placed 04/28/2049 -- re-sent 05/23/2014-- shows Pi3KCA and TP53 mutations  (a) [facility] one test requested 12/10/2014-- confirms [employer] results  (b) did not qualify for capecitabine/ BYL 719 trial because of elevated lipase  (14) started eribulin 12/26/2014, 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 Dropped from 2g BID to 1.5g BID starting on 04/26/15.     PLAN:  While, Kristin Hoffman is tolerating the capecitabine now, she fears it is not working. She is  already scheduled for restaging scans tomorrow.  We are adding a CT of the soft tissues of the neck to evaluate inflammation to the left supraclavicular area which feels like a lymph node. The labs were reviewed in detail and there is a very mild elevation to her AST and bilirubin again, putting them just out of their normal ranges. Otherwise the rest of the labs are stable.   I have reminded Kristin Hoffman to keep her extremities moisturized with a good ointment. This will minimize peeling.  Kristin Hoffman will return on Monday for discussion of her CT results. I have advised her not to hesitate to go to the ED over the weekend if her breathing condition seriously worsens. At this time she has it under control. She understands and agrees with this plan. She knows the goal of treatment in her case is control. She has been encouraged to call with any issues that might arise before her next visit here   Laurie Panda, NP   05/21/2015 2:38 PM

## 2015-05-22 ENCOUNTER — Ambulatory Visit (HOSPITAL_COMMUNITY)
Admission: RE | Admit: 2015-05-22 | Discharge: 2015-05-22 | Disposition: A | Discharge status: Home / Self Care | Payer: 59 | Source: Ambulatory Visit | Attending: Oncology | Admitting: Oncology

## 2015-05-22 ENCOUNTER — Other Ambulatory Visit: Payer: Self-pay | Admitting: Oncology

## 2015-05-22 ENCOUNTER — Encounter (HOSPITAL_COMMUNITY): Payer: Self-pay

## 2015-05-22 ENCOUNTER — Ambulatory Visit (HOSPITAL_COMMUNITY)
Admission: RE | Admit: 2015-05-22 | Discharge: 2015-05-22 | Disposition: A | Discharge status: Home / Self Care | Payer: 59 | Source: Ambulatory Visit | Attending: Radiology | Admitting: Radiology

## 2015-05-22 DIAGNOSIS — R599 Enlarged lymph nodes, unspecified: Secondary | ICD-10-CM | POA: Diagnosis not present

## 2015-05-22 DIAGNOSIS — D630 Anemia in neoplastic disease: Secondary | ICD-10-CM

## 2015-05-22 DIAGNOSIS — C50312 Malignant neoplasm of lower-inner quadrant of left female breast: Secondary | ICD-10-CM | POA: Diagnosis not present

## 2015-05-22 DIAGNOSIS — Z08 Encounter for follow-up examination after completed treatment for malignant neoplasm: Secondary | ICD-10-CM | POA: Diagnosis present

## 2015-05-22 DIAGNOSIS — G620 Drug-induced polyneuropathy: Secondary | ICD-10-CM | POA: Diagnosis not present

## 2015-05-22 DIAGNOSIS — Z8673 Personal history of transient ischemic attack (TIA), and cerebral infarction without residual deficits: Secondary | ICD-10-CM

## 2015-05-22 DIAGNOSIS — J90 Pleural effusion, not elsewhere classified: Secondary | ICD-10-CM | POA: Diagnosis not present

## 2015-05-22 DIAGNOSIS — C7989 Secondary malignant neoplasm of other specified sites: Secondary | ICD-10-CM | POA: Diagnosis not present

## 2015-05-22 DIAGNOSIS — C50912 Malignant neoplasm of unspecified site of left female breast: Secondary | ICD-10-CM

## 2015-05-22 DIAGNOSIS — T451X5A Adverse effect of antineoplastic and immunosuppressive drugs, initial encounter: Secondary | ICD-10-CM | POA: Insufficient documentation

## 2015-05-22 DIAGNOSIS — C50919 Malignant neoplasm of unspecified site of unspecified female breast: Secondary | ICD-10-CM

## 2015-05-22 DIAGNOSIS — C787 Secondary malignant neoplasm of liver and intrahepatic bile duct: Secondary | ICD-10-CM | POA: Insufficient documentation

## 2015-05-22 DIAGNOSIS — Z9889 Other specified postprocedural states: Secondary | ICD-10-CM

## 2015-05-22 MED ORDER — IOHEXOL 300 MG/ML  SOLN
100.0000 mL | Freq: Once | INTRAMUSCULAR | Status: AC | PRN
  Administered 2015-05-22: 100 mL via INTRAVENOUS

## 2015-05-22 NOTE — Procedures (Addendum)
Ultrasound-guided therapeutic left thoracentesis performed yielding 1.5 L amber/blood tinged fluid. Follow-up chest x-ray pending. The patient did experience a mild vasovagal response post procedure which  responded well to supine positioning and cool washcloth to the forehead. Vital signs were stable upon discharge.

## 2015-05-24 NOTE — Progress Notes (Signed)
ID: Kristin Hoffman OB: 12/22/1965  MR#: [id number]  CSN#:[id number]  PCP: POLLOCK, NELSON, MD GYN:  James Tomblin SU: Christian Streck OTHER MD: Matthew Manning, E. Claire Dees  CHIEF COMPLAINT:  Stage IV breast cancer  CURRENT TREATMENT: carboplatin/ gemcitabine  BREAST CANCER HISTORY: This patient was previously followed by Dr. Peter Rubin, and was transferred to Dr. Magrinat's service as of 08/20/2013.  At the age of 39, the patient had a screening mammogram as a baseline. She had recently given birth and was on birth control pills at that time. The mammogram in November 2006 showed an area of architectural distortion in the left lower inner quadrant. Subsequently an ultrasound was obtained of the left breast showing a 2.0 x 1.5 x 1.4 cm mass, at the 8:00 position, 4 cm from the nipple. A core biopsy performed on 10/21/2005 showed an invasive mammary carcinoma, ER +70%, PR +41%, HER-2/neu negative, with proliferation marker of 38%. ([id number])  Breast MRI on 11/08/2005 confirmed a 2.5 cm spiculated mass in the left lower inner quadrant with 3 small satellite nodules adjacent to the primary mass: 3.5 cm posterior medial to the primary mass, measuring 9 mm; 1.5 cm anterior medial to the primary mass measuring 5 mm; and 2 cm medial to the primary mass measuring 5 mm. No axillary adenopathy was noted. No suspicious masses or enhancement are noted in the right breast.  Kristin Hoffman underwentt left lumpectomy under the care of Dr. 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 2 of 23 lymph nodes were involved.  Margins were clear.  She received adjuvant chemotherapy consisting of 6 q. three-week doses of docetaxel/doxorubicin/cyclophosphamide given between January 2007 and may of 2007, with last dose on 04/20/2006. She underwent radiation therapy completed 07/11/2006, after which she began on tamoxifen in early August  2007.  She underwent hysterectomy with bilateral salpingo-oophorectomy on 07/15/2008, and was started on letrozole in October of 2009.  A mammogram 11/10/2009 showed microcalcifications in the left breast, and a subsequent biopsy on 11/18/2009 confirmed invasive mammary carcinoma in the left breast. PET and CT of the chest showed no evidence of metastasis in January 2011.  She underwent bilateral mastectomies 01/25/2010, with the right breast showing no evidence of malignancy; in the left breast showing a 1.0 cm grade 2 invasive ductal carcinoma with high-grade DCIS. Tumor was ER +22%, PR negative, HER-2/neu negative, with proliferation marker of 13%. Margins were clear. Patient underwent concurrent left latissimus flap reconstruction and right implant reconstruction.  She received additional chemotherapy with one cycle of carboplatin/gemcitabine given on 03/11/2010. She then received 5 q. three-week cycles of IV CMF between 03/25/2010 and 06/17/2010.  She was started on exemestane in January 2012 and continued until late November 2013 when she was hospitalized for apparent TIA.   Her subsequent history is as detailed below  INTERVAL HISTORY: Kristin Hoffman returns today for follow up of her metastatic breast cancer accompanied by her husband Greg.. When we saw her last week, just before the CT scans, she was having complaints regarding fullness in the left supraclavicular area in shortness of breath. On Friday, 05/22/2015 I was called by radiology reporting significant progression of the patient's cancer in the left supraclavicular area, mediastinum,, lung, liver, pancreas and the hepatoduodenal ligament. There was also a left pleural effusion and we requested that it be tapped to see if this helped the patient's breathing problems.  1.5 L of amber/blood-tinged fluid was   removed.she has significant pain from the actual procedure, but her breathing has improved.   REVIEW OF SYSTEMS: She describes herself is  severely fatigued. The pain from the thoracentesis was intermittent but persistent and she says she was screening with pain the day after the procedure. She has had mild dry cough and a runny nose, and though her breathing is better she still feels short of breath even were walking any distance on a flat surface. She has had no unusual headaches, visual changes, nausea, or vomiting. Her appetite is poor. She has a very faint rash over the lower extremities. She has chronic back pain which is not more intense or persistent than before. She feels weak and anxious. She denies depression. She is seeing "little floaters" today, she thinks because of blood pressure problems, but she has not experienced this before. A detailed review of systems today was otherwise stable  PAST MEDICaL HISTORY: Past Medical History  Diagnosis Date  . Depression   . Reflux   . Hx-TIA (transient ischemic attack) 11/22/2013  . Chronic headaches 11/22/2013  . Anxiety 11/22/2013  . Breast cancer   . Cancer   . Breast cancer metastasized to multiple sites 12/24/2013    PAST SURGICAL HISTORY: Past Surgical History  Procedure Laterality Date  . Mastectomy w/ nodes partial    . Mastectomy    . Breast reconstruction    . Portacath placement      x 2  . Portacath removal      x 2  . Abdominal hysterectomy    . Tee without cardioversion  11/12/2012    Procedure: TRANSESOPHAGEAL ECHOCARDIOGRAM (TEE);  Surgeon: Mark Skains, MD;  Location: [facility];  Service: Cardiovascular;  Laterality: N/A;  This TEE may be Dr. Skains or a LHC Dr    FAMILY HISTORY Both parents are alive and well. Patient has one sister who is 10 years younger and is in good health. No other history of breast or ovarian cancer in the family. No family history on file.  GYNECOLOGIC HISTORY:   (Updated January 2015) G2P2, menarche at age 11 with irregular menses. On birth control pills in the past. On Clomid to induce ovulation with first pregnancy.  Also had preeclampsia with first pregnancy. Status post hysterectomy and bilateral salpingo-oophorectomy in August 2009.  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 She is originally from [place]. She's been married to Greg, for 13 years. He works as an electronics technician at [employer].   ADVANCED DIRECTIVES:  Not in place; Not in place. At the clinic visit 05/25/2015 the patient was given the appropriate forms to complete and notarize at her discretion.    HEALTH MAINTENANCE:  (Updated 11/22/2013) History  Substance Use Topics  . Smoking status: Never Smoker   . Smokeless tobacco: Never Used  . Alcohol Use: Yes     Comment: rarely     Colonoscopy:  Never  PAP: S/P LAVH/BSO in August 2009  Bone density: Never  Lipid panel: Not on file   Allergies  Allergen Reactions  . Afinitor [Everolimus] Hives    Current Outpatient Prescriptions  Medication Sig Dispense Refill  . acidophilus (RISAQUAD) CAPS capsule Take 1 capsule by mouth daily.    . aspirin 81 MG tablet Take 81 mg by mouth daily.    . Biotin 5000 MCG CAPS Take 1 capsule (5,000 mcg total) by mouth daily. 30 capsule   .   cetirizine (ZYRTEC) 10 MG tablet Take 10 mg by mouth daily. Pt takes this med at night    . Cholecalciferol 2000 UNITS CHEW Chew 1 tablet by mouth daily.    . cyclobenzaprine (FLEXERIL) 5 MG tablet   1  . escitalopram (LEXAPRO) 20 MG tablet Take 20 mg by mouth daily with breakfast.     . Ibuprofen (ADVIL) 200 MG CAPS Take 2 capsules (400 mg total) by mouth 3 (three) times daily with meals as needed. (Patient not taking: Reported on 05/21/2015) 120 each 0  . LORazepam (ATIVAN) 0.5 MG tablet TAKE 1 TABLET BY MOUTH AS NEEDED FOR NAUSEA AND VOMITING AS DIRECTED 30 tablet 0  . Melatonin 10 MG TABS Take 1 tablet by mouth at bedtime.    . montelukast (SINGULAIR) 10 MG tablet Take 10 mg by mouth daily. Pt takes this med in  the morning.    . Multiple Vitamin (MULTIVITAMIN) tablet Take 1 tablet by mouth daily.    Marland Kitchen nystatin (MYCOSTATIN) 100000 UNIT/ML suspension Take 5 mLs (500,000 Units total) by mouth 4 (four) times daily. (Patient not taking: Reported on 05/08/2015) 240 mL 1  . omeprazole (PRILOSEC) 40 MG capsule Take 40 mg by mouth daily.     . ondansetron (ZOFRAN) 8 MG tablet     . prochlorperazine (COMPAZINE) 10 MG tablet     . traMADol (ULTRAM) 50 MG tablet Take 1 tablet (50 mg total) by mouth every 6 (six) hours as needed. 30 tablet 3   No current facility-administered medications for this visit.    OBJECTIVE: Young white woman who appears stated age 21 Vitals:   05/25/15 1423  BP: 120/92  Pulse: 122  Temp: 99.6 F (37.6 C)  Resp: 18  Body mass index is 31.34 kg/(m^2).  ECOG: 2 Filed Weights   05/25/15 1423  Weight: 176 lb 14.4 oz (80.241 kg)    Sclerae unicteric, EOMs intact Oropharynx clear, dentition in good repair Left supraclavicular adenopathy, as previously described Lungs no rales or rhonchi, decreased left breath sounds at the base Heart regular rate and rhythm Abd soft, nontender, positive bowel sounds MSK no focal spinal tenderness, no upper extremity lymphedema Neuro: nonfocal, well oriented, distraught affect Breasts: deferred    LAB RESULTS:   Lab Results  Component Value Date   WBC 7.3 05/25/2015   NEUTROABS 5.7 05/25/2015   HGB 12.3 05/25/2015   HCT 37.8 05/25/2015   MCV 91.3 05/25/2015   PLT 306 05/25/2015      Chemistry      Component Value Date/Time   NA 138 05/25/2015 1350   NA 142 11/12/2012 0735   K 3.3* 05/25/2015 1350   K 3.8 11/12/2012 0735   CL 104 11/14/2012 1407   CL 104 11/12/2012 0735   CO2 25 05/25/2015 1350   CO2 30 11/12/2012 0735   BUN 11.2 05/25/2015 1350   BUN 13 11/12/2012 0735   CREATININE 0.8 05/25/2015 1350   CREATININE 0.97 11/12/2012 0735      Component Value Date/Time   CALCIUM 8.5 05/25/2015 1350   CALCIUM 9.1  11/12/2012 0735   ALKPHOS 82 05/25/2015 1350   ALKPHOS 69 11/12/2012 0735   AST 46* 05/25/2015 1350   AST 16 11/12/2012 0735   ALT 11 05/25/2015 1350   ALT 13 11/12/2012 0735   BILITOT 0.83 05/25/2015 1350   BILITOT 0.9 11/12/2012 0735      STUDIES: Dg Chest 1 View  05/22/2015   CLINICAL DATA:  Post left-sided thoracentesis. History of  breast cancer  EXAM: CHEST  1 VIEW  COMPARISON:  CT the chest, abdomen and pelvis - earlier same day  FINDINGS: Grossly unchanged cardiac silhouette and mediastinal contours with partial obscuration of the right heart border secondary to known dominant metastasis within the anterior aspect the right upper lobe. Stable position of support apparatus. Interval reduction in persistent small partially loculated sided effusion post thoracentesis. No pneumothorax. There is prominence of the left pulmonary hila compatible with the known left-sided anterior mediastinal adenopathy. Surgical clips overlie the left axilla the superior aspect the left mid hemi abdomen. No acute osseus abnormalities.  IMPRESSION: 1. Interval reduction in persistent small loculated left-sided effusion post thoracentesis. No pneumothorax. 2. Findings compatible with extensive metastatic disease to the chest. Please refer to contrast-enhanced chest CT performed earlier same day.   Electronically Signed   By: Sandi Mariscal M.D.   On: 05/22/2015 16:59   Ct Soft Tissue Neck W Contrast  05/22/2015   CLINICAL DATA:  49 year old female with metastatic breast cancer. Palpable abnormality on the left neck. Subsequent encounter.  EXAM: CT NECK WITH CONTRAST  TECHNIQUE: Multidetector CT imaging of the neck was performed using the standard protocol following the bolus administration of intravenous contrast.  CONTRAST:  118m OMNIPAQUE IOHEXOL 300 MG/ML SOLN in conjunction with contrast enhanced imaging of the chest, abdomen, and pelvis reported separately.  COMPARISON:  PET-CT 12/08/2014. Head CT without contrast  11/08/2012.  FINDINGS: Pharynx and larynx: No laryngeal or pharyngeal mass. Negative parapharyngeal spaces. Negative retropharyngeal space. Negative sublingual space.  Salivary glands: Submandibular and parotid glands are within normal limits.  Thyroid: Negative.  Lymph nodes:  Palpable area of concern marked along the anterior base of the left neck. Underlying large 34 x 35 x 30 mm (AP by transverse by CC) heterogeneously enhancing soft tissue mass most resembles lymph nodal metastasis (left level 4) with extracapsular extension of disease. This nodal mass abuts the right subclavian neurovascular bundle. The nearby a left IJ and subclavian vein confluence appears to remain patent, but otherwise there is little left subclavian vein intravascular contrast.  There is a small calcified spiculated soft tissue nodule located just posterior to the left thyroid lobe which probably reflects previously treated nodal disease (series 7, image 59).  Other cervical lymph node stations are within normal limits.  However, there is abundant abnormal superior mediastinal and right peritracheal lymphadenopathy, see chest CT reported separately.  Vascular: Right side porta cath in place. Internal jugular veins are patent. Major arterial structures in the neck and at the skullbase are patent.  Limited intracranial: Negative.  Visualized orbits: Negative.  Mastoids and visualized paranasal sinuses: Visualized paranasal sinuses and mastoids are clear.  Skeleton: No acute or suspicious osseous lesion in the neck.  Upper chest: Abnormal, reported separately.  IMPRESSION: 1. Left anterior lower neck palpable abnormality corresponds to left level IV metastatic nodal disease with extra-capsular extension. Conglomerate nodal disease here measures up to 35 mm. 2. No other metastatic disease identified in the neck. 3. Abnormal chest and mediastinum, reported separately.   Electronically Signed   By: HGenevie AnnM.D.   On: 05/22/2015 15:28   Ct  Chest W Contrast  05/22/2015   CLINICAL DATA:  Subsequent encounter for left breast cancer status post bilateral mastectomy with liver mets.  EXAM: CT CHEST, ABDOMEN, AND PELVIS WITH CONTRAST  TECHNIQUE: Multidetector CT imaging of the chest, abdomen and pelvis was performed following the standard protocol during bolus administration of intravenous contrast.  CONTRAST:  1015m  OMNIPAQUE IOHEXOL 300 MG/ML  SOLN  COMPARISON:  Comparison is made to a report for a CT scan at [facility] dated 03/23/2015.  FINDINGS: CT CHEST FINDINGS  Mediastinum/Nodes: Tip of right-sided Port-A-Cath is in the right atrium. Patient is status post bilateral mastectomy and reconstruction.  Substantial disease progression noted in the chest since 12/28/ 2015. 3.4 x 2.6 cm left supraclavicular lymph node not mentioned previously. 2.6 x 2.5 cm lymph node in the superior left mediastinum mentioned on the previous report presumably represents the 3.8 x 4.3 cm necrotic lymph node seen in the prevascular space today. 2.6 cm necrotic high right paratracheal lymph node not described previously.  5.6 x 8.2 cm necrotic pleural-based lesion in the anterior upper left hemi thorax is apparently new in the interval. Small left pleural effusion described previously with moderate to large left pleural effusion evident today. Multiple left-sided pleural nodules are evident. Disease in the left hemi thorax displaces cardiomediastinal structures to the right.  Lungs/Pleura: 3.3 cm right middle lobe lung mass is contiguous with the anterior pleura. Fine detail of the lungs is obscured by breathing motion. Multiple bilateral pulmonary nodules measuring in the 5-10 mm size range are evident. There is left upper and lower lobe collapse/ consolidation.  Musculoskeletal: Bone windows reveal no worrisome lytic or sclerotic osseous lesions.  CT ABDOMEN AND PELVIS FINDINGS  Hepatobiliary: Heterogeneously enhancing mass in the dome of the liver measures 6.9 x 7.6  cm. Previous study reported a hepatic dome lesion measuring 6.5 x 5.9 cm. 2.7 cm lesion is identified in the caudate lobe. Gallbladder is decompressed. No intrahepatic or extrahepatic biliary dilation.  Pancreas: 3.2 x 4.3 cm mass in the head of the pancreas results and mild distention of the main pancreatic duct. A pancreatic mass was described in the previous report, measuring 3.3 x 2.9 cm at that time.  Spleen: No splenomegaly. No focal mass lesion.  Adrenals/Urinary Tract: No adrenal nodule or mass. Tiny cyst noted in the left kidney. No hydronephrosis. No hydroureter. Urinary bladder is unremarkable.  Stomach/Bowel: Stomach is nondistended. No gastric wall thickening. No evidence of outlet obstruction. Duodenum is normally positioned as is the ligament of Treitz. No small bowel wall thickening. No small bowel dilatation. Terminal ileum normal. Appendix normal. No gross colonic mass. No colonic wall thickening. No substantial diverticular change.  Vascular/Lymphatic: No abdominal aortic aneurysm. 2.8 cm necrotic portal caval lymph node is identified. Portal vein is markedly attenuated as it passes between the pancreatic head lesion and the portal caval lymph node, but the portal vein does appear to remain patent. Splenic vein and superior mesenteric vein are patent. No para-aortic lymphadenopathy. No pelvic sidewall lymphadenopathy.  Reproductive: Uterus is surgically absent.  No adnexal mass.  Other: No intraperitoneal free fluid.  Musculoskeletal: Bone windows reveal no worrisome lytic or sclerotic osseous lesions.  IMPRESSION: Comparing to the report of a study from 03/23/2015, there has been marked progression of metastatic disease in the chest and abdomen. This involves left supraclavicular, mediastinal, pleural , lung parenchymal, liver, pancreatic, and hepatoduodenal ligament nodal metastases.  Moderate to large left pleural effusion is associated with left upper and lower lobe collapse/ consolidation.   Compression of the portal vein between the pancreatic head mass and the hepatoduodenal ligament lymphadenopathy.   Electronically Signed   By: Eric  Mansell M.D.   On: 05/22/2015 15:40   Ct Abdomen Pelvis W Contrast  05/22/2015   CLINICAL DATA:  Subsequent encounter for left breast cancer status post bilateral mastectomy with   liver mets.  EXAM: CT CHEST, ABDOMEN, AND PELVIS WITH CONTRAST  TECHNIQUE: Multidetector CT imaging of the chest, abdomen and pelvis was performed following the standard protocol during bolus administration of intravenous contrast.  CONTRAST:  173m OMNIPAQUE IOHEXOL 300 MG/ML  SOLN  COMPARISON:  [facility]  FINDINGS: CT CHEST FINDINGS  Mediastinum/Nodes: Tip of right-sided Port-A-Cath is in the right atrium. Patient is status post bilateral mastectomy and reconstruction.  Substantial disease progression noted in the chest since 12/28/ 2015. 3.4 x 2.6 cm left supraclavicular lymph node not mentioned previously. 2.6 x 2.5 cm lymph node in the superior left mediastinum mentioned on the previous report presumably represents the 3.8 x 4.3 cm necrotic lymph node seen in the prevascular space today. 2.6 cm necrotic high right paratracheal lymph node not described previously.  5.6 x 8.2 cm necrotic pleural-based lesion in the anterior upper left hemi thorax is apparently new in the interval. Small left pleural effusion described previously with moderate to large left pleural effusion evident today. Multiple left-sided pleural nodules are evident. Disease in the left hemi thorax displaces cardiomediastinal structures to the right.  Lungs/Pleura: 3.3 cm right middle lobe lung mass is contiguous with the anterior pleura. Fine detail of the lungs is obscured by breathing motion. Multiple bilateral pulmonary nodules measuring in the 5-10 mm size range are evident. There is left upper and lower lobe collapse/ consolidation.   Musculoskeletal: Bone windows reveal no worrisome lytic or sclerotic osseous lesions.  CT ABDOMEN AND PELVIS FINDINGS  Hepatobiliary: Heterogeneously enhancing mass in the dome of the liver measures 6.9 x 7.6 cm. Previous study reported a hepatic dome lesion measuring 6.5 x 5.9 cm. 2.7 cm lesion is identified in the caudate lobe. Gallbladder is decompressed. No intrahepatic or extrahepatic biliary dilation.  Pancreas: 3.2 x 4.3 cm mass in the head of the pancreas results and mild distention of the main pancreatic duct. A pancreatic mass was described in the previous report, measuring 3.3 x 2.9 cm at that time.  Spleen: No splenomegaly. No focal mass lesion.  Adrenals/Urinary Tract: No adrenal nodule or mass. Tiny cyst noted in the left kidney. No hydronephrosis. No hydroureter. Urinary bladder is unremarkable.  Stomach/Bowel: Stomach is nondistended. No gastric wall thickening. No evidence of outlet obstruction. Duodenum is normally positioned as is the ligament of Treitz. No small bowel wall thickening. No small bowel dilatation. Terminal ileum normal. Appendix normal. No gross colonic mass. No colonic wall thickening. No substantial diverticular change.  Vascular/Lymphatic: No abdominal aortic aneurysm. 2.8 cm necrotic portal caval lymph node is identified. Portal vein is markedly attenuated as it passes between the pancreatic head lesion and the portal caval lymph node, but the portal vein does appear to remain patent. Splenic vein and superior mesenteric vein are patent. No para-aortic lymphadenopathy. No pelvic sidewall lymphadenopathy.  Reproductive: Uterus is surgically absent.  No adnexal mass.  Other: No intraperitoneal free fluid.  Musculoskeletal: Bone windows reveal no worrisome lytic or sclerotic osseous lesions.  IMPRESSION: Comparing to the report of a study from 03/23/2015, there has been marked progression of metastatic disease in the chest and abdomen. This involves left supraclavicular,  mediastinal, pleural , lung parenchymal, liver, pancreatic, and hepatoduodenal ligament nodal metastases.  Moderate to large left pleural effusion is associated with left upper and lower lobe collapse/ consolidation.  Compression of the portal vein between the pancreatic head mass and the hepatoduodenal ligament lymphadenopathy.   Electronically  Signed   By: Misty Stanley M.D.   On: 05/22/2015 15:40   US Thoracentesis Asp Pleural Space W/img Guide  05/22/2015   INDICATION: Breast cancer, dyspnea, left pleural effusion. Request is made for therapeutic left thoracentesis.  EXAM: ULTRASOUND GUIDED THERAPEUTIC LEFT THORACENTESIS  COMPARISON:  None.  MEDICATIONS: None  COMPLICATIONS: None immediate  TECHNIQUE: Informed written consent was obtained from the patient after a discussion of the risks, benefits and alternatives to treatment. A timeout was performed prior to the initiation of the procedure.  Initial ultrasound scanning demonstrates a moderate to large left pleural effusion. The lower chest was prepped and draped in the usual sterile fashion. 1% lidocaine was used for local anesthesia.  An ultrasound image was saved for documentation purposes. A 6 Fr Safe-T-Centesis catheter was introduced. The thoracentesis was performed. The catheter was removed and a dressing was applied. The patient did experience a mild vasovagal reaction post procedure which responded well to supine positioning and cool washcloth to the forehead. Patient was escorted to have an upright chest radiograph.  FINDINGS: A total of approximately 1.5 liters of amber/blood-tinged fluid was removed.  IMPRESSION: Successful ultrasound-guided therapeutic left sided thoracentesis yielding 1.5 liters of pleural fluid. Patient with mild vasovagal response post procedure which responded well to supine positioning and cool compress to the forehead. Vital signs were stable upon discharge.  Read by: Rowe Robert, PA-C   Electronically Signed   By: Aletta Edouard M.D.   On: 05/22/2015 17:29     ASSESSMENT: 49 y.o. [place] woman  (1)  status post left lumpectomy under the care of Dr. 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 2 of 23 lymph nodes were involved.  Margins were clear.  (2) status post adjuvant chemotherapy consisting of 6 q. three-week doses of docetaxel/ doxorubicin/ cyclophosphamide given between January 2007 and may of 2007, with last dose on 04/20/2006.  (3) status post radiation therapy to the left breast, completed 07/11/2006  (4) began on tamoxifen in early August 2007 and continued until October 2009. She status post hysterectomy with bilateral salpingo-oophorectomy on 07/15/2008, and was started on letrozole in October of 2009.  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 PET and CT of the chest showed no evidence of metastasis in January 2011.  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 Margins were clear. Patient underwent concurrent left latissimus flap reconstruction and right implant reconstruction.  (7)  status post additional chemotherapy with one cycle of carboplatin/gemcitabine given on 03/11/2010. She then received 5 q. three-week cycles of IV CMF between 03/25/2010 and 06/17/2010.  (8) started on exemestane in January 2012 and continued until late November 2013 when she was hospitalized for apparent TIA at which time her exemestane was stopped and not resumed  (9) METASTATIC DISEASE: evidence of disease recurence noted on chest CT, liver MRI and PET scan in December 2014, with  suspicious lung nodules, lymphadenopathy, and 3 lesions in the right lobe of the liver, but no evidence of bony disease and no evidence of brain involvement by brain MRI September 2014. Biopsy of a left supraclavicular lymph node on 12/11/2013 confirmed  metastatic carcinoma, estrogen receptor 45% positive, progesterone receptor 17% positive, with no HER-2 amplification.  (10) fulvestrant started 12/03/2013, discontinued 03/25/2014 with progression  (11) exemestane/ everolimus started 04/04/2014; everolimus held 04/14/2014, with skin rash and mouth soreness; resumed at 5 mg a day as of 04/29/2014, discontinued 05/09/2014 with similar symptoms  (12) continueing exemestane, adding palbociclib 05/26/2014;   (a) palbociclib dose dropped to 75 mg every other day for 21 days beginning with cycle 4  (b) exemestane and Palbociclib discontinued December 2015 with evidence of progression  (13) [facility] referral placed 04/28/2049 -- re-sent 05/23/2014-- shows Pi3KCA and TP53 mutations  (a) [facility] one test requested 12/10/2014-- confirms [employer] results  (b) did not qualify for capecitabine/ BYL 719 trial because of elevated lipase  (14) started eribulin 12/26/2014, 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   (a) dpse dropped from 2g BID to 1.5g BID starting on 04/26/15  (b) stopped 05/17/2015 (after 4 cycles) with progression  (16) to start carboplatin/ gemcitabine  05/26/2015, to be given day one and day 8 of each 21 day cycle  (a) given her history of severe neutropenia with this chemotherapy we'll do Neupogen days 23 and 4 of each cycle and onpro day 9   PLAN:  I spent approximately one hour today with Kristin Hoffman and [name] going over her situation. Her cancer has progressed and we are at a difficult point in terms of decisions. She  has had multiple chemotherapy agents  and multipleanti-estrogens previously. Accordingly the chance of response is low--I put it between 15 and 30%. We reviewed the fact that stage IV breast cancer is not curable with our current knowledge base. The goal of treatment is control. The strategy of treatment is to do only the minimum necessary to control the growth of the tumor so that the patient can have as normal a life as possible. However, with significant involvement of the lungs and liver, if we are going to have a response before this tumor takes her life, she will need aggressive treatment.   There are always 3 questions to go over and so we reviewed those today. The first question is do we treat or not. In some cases the patient's overall situation is so discouraging that palliative/comfort care alone is appropriate. If the decision is made to treat, then the next question is whether anti-estrogens or chemotherapy is more appropriate. Once that decision is made than the third question is: Which agent or combination of agents in particular should be used?  Tristen definitely wishes to proceed to aggressive treatment. My recommendation at this point, particularly with liver involvement, is for carboplatin and gemcitabine.She actually received 1 cycle of this chemotherapy in March 2011. I have reviewed the notes from that time and according to the record she tolerated the chemotherapy well but had profound neutropenia. That is why her chemotherapy was changed at that time to CMF.   Accordingly we would support her white cell count with Neupogen and Neulasta to enable her to receive the treatment as proposed. This would be carboplatin at an AUC of 2 and gemcitabine at 800 mg/m on day 1 and 8 of each 21 day cycle. She would receive Neupogen for 3 days after day 1 and Neulasta on day 9. We would reassess after 3 cycles.  I did let [place] know that there is a  possibility the cancer may continue to grow right  through this chemotherapy, and in that case the best option would be for comfort care under a hospice umbrella. We discussed advanced directives today, I also urged her to get her legal papers in order. Finally I gave her a pamphlet on the [facility] program, which I think would be helpful to her children.  She will have her first treatment tomorrow. If her husband wishes to learn to give her the Neupogen at home we can try to arrange for that through her insurance. On the day 9 treatment she will have onpro.  She will see me next week before her second dose to assess tolerance. She knows to call for any problems that may develop before that visit.   Chauncey Cruel, MD   05/25/2015 5:29 PM

## 2015-05-25 ENCOUNTER — Other Ambulatory Visit (HOSPITAL_BASED_OUTPATIENT_CLINIC_OR_DEPARTMENT_OTHER): Payer: 59

## 2015-05-25 ENCOUNTER — Other Ambulatory Visit: Payer: Self-pay | Admitting: *Deleted

## 2015-05-25 ENCOUNTER — Ambulatory Visit (HOSPITAL_COMMUNITY): Payer: 59

## 2015-05-25 ENCOUNTER — Telehealth: Payer: Self-pay | Admitting: Oncology

## 2015-05-25 ENCOUNTER — Ambulatory Visit (HOSPITAL_BASED_OUTPATIENT_CLINIC_OR_DEPARTMENT_OTHER): Payer: 59 | Admitting: Oncology

## 2015-05-25 VITALS — BP 120/92 | HR 122 | Temp 99.6°F | Resp 18 | Ht 63.0 in | Wt 176.9 lb

## 2015-05-25 DIAGNOSIS — C770 Secondary and unspecified malignant neoplasm of lymph nodes of head, face and neck: Secondary | ICD-10-CM

## 2015-05-25 DIAGNOSIS — G620 Drug-induced polyneuropathy: Secondary | ICD-10-CM

## 2015-05-25 DIAGNOSIS — C50312 Malignant neoplasm of lower-inner quadrant of left female breast: Secondary | ICD-10-CM

## 2015-05-25 DIAGNOSIS — C50919 Malignant neoplasm of unspecified site of unspecified female breast: Secondary | ICD-10-CM

## 2015-05-25 DIAGNOSIS — C787 Secondary malignant neoplasm of liver and intrahepatic bile duct: Secondary | ICD-10-CM

## 2015-05-25 DIAGNOSIS — T451X5A Adverse effect of antineoplastic and immunosuppressive drugs, initial encounter: Secondary | ICD-10-CM

## 2015-05-25 LAB — COMPREHENSIVE METABOLIC PANEL (CC13)
ALBUMIN: 2.5 g/dL — AB (ref 3.5–5.0)
ALT: 11 U/L (ref 0–55)
ANION GAP: 10 meq/L (ref 3–11)
AST: 46 U/L — ABNORMAL HIGH (ref 5–34)
Alkaline Phosphatase: 82 U/L (ref 40–150)
BILIRUBIN TOTAL: 0.83 mg/dL (ref 0.20–1.20)
BUN: 11.2 mg/dL (ref 7.0–26.0)
CHLORIDE: 103 meq/L (ref 98–109)
CO2: 25 meq/L (ref 22–29)
Calcium: 8.5 mg/dL (ref 8.4–10.4)
Creatinine: 0.8 mg/dL (ref 0.6–1.1)
EGFR: 85 mL/min/{1.73_m2} — ABNORMAL LOW (ref >90)
GLUCOSE: 143 mg/dL — AB (ref 70–140)
POTASSIUM: 3.3 meq/L — AB (ref 3.5–5.1)
SODIUM: 138 meq/L (ref 136–145)
TOTAL PROTEIN: 5.4 g/dL — AB (ref 6.4–8.3)

## 2015-05-25 LAB — CBC WITH DIFFERENTIAL/PLATELET
BASO%: 0.7 % (ref 0.0–2.0)
BASOS ABS: 0 10*3/uL (ref 0.0–0.1)
EOS ABS: 0.2 10*3/uL (ref 0.0–0.5)
EOS%: 2.4 % (ref 0.0–7.0)
HCT: 37.8 % (ref 34.8–46.6)
HEMOGLOBIN: 12.3 g/dL (ref 11.6–15.9)
LYMPH%: 11.6 % — AB (ref 14.0–49.7)
MCH: 29.8 pg (ref 25.1–34.0)
MCHC: 32.7 g/dL (ref 31.5–36.0)
MCV: 91.3 fL (ref 79.5–101.0)
MONO#: 0.6 10*3/uL (ref 0.1–0.9)
MONO%: 7.7 % (ref 0.0–14.0)
NEUT%: 77.6 % — ABNORMAL HIGH (ref 38.4–76.8)
NEUTROS ABS: 5.7 10*3/uL (ref 1.5–6.5)
PLATELETS: 306 10*3/uL (ref 145–400)
RBC: 4.14 10*6/uL (ref 3.70–5.45)
RDW: 21.9 % — AB (ref 11.2–14.5)
WBC: 7.3 10*3/uL (ref 3.9–10.3)
lymph#: 0.9 10*3/uL (ref 0.9–3.3)

## 2015-05-25 NOTE — Telephone Encounter (Signed)
Gave avs & calendar for June. Sent message to schedule treatment for 06/21.

## 2015-05-26 ENCOUNTER — Ambulatory Visit (HOSPITAL_BASED_OUTPATIENT_CLINIC_OR_DEPARTMENT_OTHER): Payer: 59

## 2015-05-26 VITALS — BP 142/74 | HR 114 | Temp 98.4°F | Resp 18

## 2015-05-26 DIAGNOSIS — C50919 Malignant neoplasm of unspecified site of unspecified female breast: Secondary | ICD-10-CM

## 2015-05-26 DIAGNOSIS — Z5111 Encounter for antineoplastic chemotherapy: Secondary | ICD-10-CM

## 2015-05-26 DIAGNOSIS — C50312 Malignant neoplasm of lower-inner quadrant of left female breast: Secondary | ICD-10-CM

## 2015-05-26 DIAGNOSIS — C770 Secondary and unspecified malignant neoplasm of lymph nodes of head, face and neck: Secondary | ICD-10-CM

## 2015-05-26 DIAGNOSIS — C787 Secondary malignant neoplasm of liver and intrahepatic bile duct: Secondary | ICD-10-CM

## 2015-05-26 MED ORDER — SODIUM CHLORIDE 0.9 % IV SOLN
Freq: Once | INTRAVENOUS | Status: AC
  Administered 2015-05-26: 15:00:00 via INTRAVENOUS

## 2015-05-26 MED ORDER — SODIUM CHLORIDE 0.9 % IV SOLN
800.0000 mg/m2 | Freq: Once | INTRAVENOUS | Status: AC
  Administered 2015-05-26: 1520 mg via INTRAVENOUS
  Filled 2015-05-26: qty 39.98

## 2015-05-26 MED ORDER — SODIUM CHLORIDE 0.9 % IV SOLN
271.8000 mg | Freq: Once | INTRAVENOUS | Status: AC
  Administered 2015-05-26: 270 mg via INTRAVENOUS
  Filled 2015-05-26: qty 27

## 2015-05-26 MED ORDER — HEPARIN SOD (PORK) LOCK FLUSH 100 UNIT/ML IV SOLN
500.0000 [IU] | Freq: Once | INTRAVENOUS | Status: AC | PRN
  Administered 2015-05-26: 500 [IU]
  Filled 2015-05-26: qty 5

## 2015-05-26 MED ORDER — SODIUM CHLORIDE 0.9 % IV SOLN
Freq: Once | INTRAVENOUS | Status: AC
  Administered 2015-05-26: 15:00:00 via INTRAVENOUS
  Filled 2015-05-26: qty 4

## 2015-05-26 MED ORDER — SODIUM CHLORIDE 0.9 % IJ SOLN
10.0000 mL | INTRAMUSCULAR | Status: DC | PRN
  Administered 2015-05-26: 10 mL
  Filled 2015-05-26: qty 10

## 2015-05-26 NOTE — Progress Notes (Signed)
Ok to treat with pulse 114 per Dr. Julien Nordmann.

## 2015-05-26 NOTE — Patient Instructions (Signed)
[facility] Discharge Instructions for Patients Receiving Chemotherapy  Today you received the following chemotherapy agents Gemzar/Carboplatin  To help prevent nausea and vomiting after your treatment, we encourage you to take your nausea medication as prescribed.   If you develop nausea and vomiting that is not controlled by your nausea medication, call the clinic.   BELOW ARE SYMPTOMS THAT SHOULD BE REPORTED IMMEDIATELY:  *FEVER GREATER THAN 100.5 F  *CHILLS WITH OR WITHOUT FEVER  NAUSEA AND VOMITING THAT IS NOT CONTROLLED WITH YOUR NAUSEA MEDICATION  *UNUSUAL SHORTNESS OF BREATH  *UNUSUAL BRUISING OR BLEEDING  TENDERNESS IN MOUTH AND THROAT WITH OR WITHOUT PRESENCE OF ULCERS  *URINARY PROBLEMS  *BOWEL PROBLEMS  UNUSUAL RASH Items with * indicate a potential emergency and should be followed up as soon as possible.  Feel free to call the clinic you have any questions or concerns. The clinic phone number is [phone].  Please show the CHEMO ALERT CARD at check-in to the Emergency Department and triage nurse.  Gemcitabine injection (Gemzar) What is this medicine? GEMCITABINE (jem SIT a been) is a chemotherapy drug. This medicine is used to treat many types of cancer like breast cancer, lung cancer, pancreatic cancer, and ovarian cancer. This medicine may be used for other purposes; ask your health care provider or pharmacist if you have questions. COMMON BRAND NAME(S): Gemzar What should I tell my health care provider before I take this medicine? They need to know if you have any of these conditions: -blood disorders -infection -kidney disease -liver disease -recent or ongoing radiation therapy -an unusual or allergic reaction to gemcitabine, other chemotherapy, other medicines, foods, dyes, or preservatives -pregnant or trying to get pregnant -breast-feeding How should I use this medicine? This drug is given as an infusion into a vein. It is  administered in a hospital or clinic by a specially trained health care professional. Talk to your pediatrician regarding the use of this medicine in children. Special care may be needed. Overdosage: If you think you have taken too much of this medicine contact a poison control center or emergency room at once. NOTE: This medicine is only for you. Do not share this medicine with others. What if I miss a dose? It is important not to miss your dose. Call your doctor or health care professional if you are unable to keep an appointment. What may interact with this medicine? -medicines to increase blood counts like filgrastim, pegfilgrastim, sargramostim -some other chemotherapy drugs like cisplatin -vaccines Talk to your doctor or health care professional before taking any of these medicines: -acetaminophen -aspirin -ibuprofen -ketoprofen -naproxen This list may not describe all possible interactions. Give your health care provider a list of all the medicines, herbs, non-prescription drugs, or dietary supplements you use. Also tell them if you smoke, drink alcohol, or use illegal drugs. Some items may interact with your medicine. What should I watch for while using this medicine? Visit your doctor for checks on your progress. This drug may make you feel generally unwell. This is not uncommon, as chemotherapy can affect healthy cells as well as cancer cells. Report any side effects. Continue your course of treatment even though you feel ill unless your doctor tells you to stop. In some cases, you may be given additional medicines to help with side effects. Follow all directions for their use. Call your doctor or health care professional for advice if you get a fever, chills or sore throat, or other symptoms of a cold or   flu. Do not treat yourself. This drug decreases your body's ability to fight infections. Try to avoid being around people who are sick. This medicine may increase your risk to bruise  or bleed. Call your doctor or health care professional if you notice any unusual bleeding. Be careful brushing and flossing your teeth or using a toothpick because you may get an infection or bleed more easily. If you have any dental work done, tell your dentist you are receiving this medicine. Avoid taking products that contain aspirin, acetaminophen, ibuprofen, naproxen, or ketoprofen unless instructed by your doctor. These medicines may hide a fever. Women should inform their doctor if they wish to become pregnant or think they might be pregnant. There is a potential for serious side effects to an unborn child. Talk to your health care professional or pharmacist for more information. Do not breast-feed an infant while taking this medicine. What side effects may I notice from receiving this medicine? Side effects that you should report to your doctor or health care professional as soon as possible: -allergic reactions like skin rash, itching or hives, swelling of the face, lips, or tongue -low blood counts - this medicine may decrease the number of white blood cells, red blood cells and platelets. You may be at increased risk for infections and bleeding. -signs of infection - fever or chills, cough, sore throat, pain or difficulty passing urine -signs of decreased platelets or bleeding - bruising, pinpoint red spots on the skin, black, tarry stools, blood in the urine -signs of decreased red blood cells - unusually weak or tired, fainting spells, lightheadedness -breathing problems -chest pain -mouth sores -nausea and vomiting -pain, swelling, redness at site where injected -pain, tingling, numbness in the hands or feet -stomach pain -swelling of ankles, feet, hands -unusual bleeding Side effects that usually do not require medical attention (report to your doctor or health care professional if they continue or are bothersome): -constipation -diarrhea -hair loss -loss of appetite -stomach  upset This list may not describe all possible side effects. Call your doctor for medical advice about side effects. You may report side effects to FDA at 1-800-FDA-1088. Where should I keep my medicine? This drug is given in a hospital or clinic and will not be stored at home. NOTE: This sheet is a summary. It may not cover all possible information. If you have questions about this medicine, talk to your doctor, pharmacist, or health care provider.  2015, Elsevier/Gold Standard. (2008-04-08 18:45:54)   Carboplatin injection What is this medicine? CARBOPLATIN (KAR boe pla tin) is a chemotherapy drug. It targets fast dividing cells, like cancer cells, and causes these cells to die. This medicine is used to treat ovarian cancer and many other cancers. This medicine may be used for other purposes; ask your health care provider or pharmacist if you have questions. COMMON BRAND NAME(S): Paraplatin What should I tell my health care provider before I take this medicine? They need to know if you have any of these conditions: -blood disorders -hearing problems -kidney disease -recent or ongoing radiation therapy -an unusual or allergic reaction to carboplatin, cisplatin, other chemotherapy, other medicines, foods, dyes, or preservatives -pregnant or trying to get pregnant -breast-feeding How should I use this medicine? This drug is usually given as an infusion into a vein. It is administered in a hospital or clinic by a specially trained health care professional. Talk to your pediatrician regarding the use of this medicine in children. Special care may be needed. Overdosage: If   you think you have taken too much of this medicine contact a poison control center or emergency room at once. NOTE: This medicine is only for you. Do not share this medicine with others. What if I miss a dose? It is important not to miss a dose. Call your doctor or health care professional if you are unable to keep an  appointment. What may interact with this medicine? -medicines for seizures -medicines to increase blood counts like filgrastim, pegfilgrastim, sargramostim -some antibiotics like amikacin, gentamicin, neomycin, streptomycin, tobramycin -vaccines Talk to your doctor or health care professional before taking any of these medicines: -acetaminophen -aspirin -ibuprofen -ketoprofen -naproxen This list may not describe all possible interactions. Give your health care provider a list of all the medicines, herbs, non-prescription drugs, or dietary supplements you use. Also tell them if you smoke, drink alcohol, or use illegal drugs. Some items may interact with your medicine. What should I watch for while using this medicine? Your condition will be monitored carefully while you are receiving this medicine. You will need important blood work done while you are taking this medicine. This drug may make you feel generally unwell. This is not uncommon, as chemotherapy can affect healthy cells as well as cancer cells. Report any side effects. Continue your course of treatment even though you feel ill unless your doctor tells you to stop. In some cases, you may be given additional medicines to help with side effects. Follow all directions for their use. Call your doctor or health care professional for advice if you get a fever, chills or sore throat, or other symptoms of a cold or flu. Do not treat yourself. This drug decreases your body's ability to fight infections. Try to avoid being around people who are sick. This medicine may increase your risk to bruise or bleed. Call your doctor or health care professional if you notice any unusual bleeding. Be careful brushing and flossing your teeth or using a toothpick because you may get an infection or bleed more easily. If you have any dental work done, tell your dentist you are receiving this medicine. Avoid taking products that contain aspirin, acetaminophen,  ibuprofen, naproxen, or ketoprofen unless instructed by your doctor. These medicines may hide a fever. Do not become pregnant while taking this medicine. Women should inform their doctor if they wish to become pregnant or think they might be pregnant. There is a potential for serious side effects to an unborn child. Talk to your health care professional or pharmacist for more information. Do not breast-feed an infant while taking this medicine. What side effects may I notice from receiving this medicine? Side effects that you should report to your doctor or health care professional as soon as possible: -allergic reactions like skin rash, itching or hives, swelling of the face, lips, or tongue -signs of infection - fever or chills, cough, sore throat, pain or difficulty passing urine -signs of decreased platelets or bleeding - bruising, pinpoint red spots on the skin, black, tarry stools, nosebleeds -signs of decreased red blood cells - unusually weak or tired, fainting spells, lightheadedness -breathing problems -changes in hearing -changes in vision -chest pain -high blood pressure -low blood counts - This drug may decrease the number of white blood cells, red blood cells and platelets. You may be at increased risk for infections and bleeding. -nausea and vomiting -pain, swelling, redness or irritation at the injection site -pain, tingling, numbness in the hands or feet -problems with balance, talking, walking -trouble passing urine   or change in the amount of urine Side effects that usually do not require medical attention (report to your doctor or health care professional if they continue or are bothersome): -hair loss -loss of appetite -metallic taste in the mouth or changes in taste This list may not describe all possible side effects. Call your doctor for medical advice about side effects. You may report side effects to FDA at 1-800-FDA-1088. Where should I keep my medicine? This drug  is given in a hospital or clinic and will not be stored at home. NOTE: This sheet is a summary. It may not cover all possible information. If you have questions about this medicine, talk to your doctor, pharmacist, or health care provider.  2015, Elsevier/Gold Standard. (2008-03-04 14:38:05)   

## 2015-05-27 ENCOUNTER — Other Ambulatory Visit: Payer: Self-pay | Admitting: Oncology

## 2015-05-27 ENCOUNTER — Telehealth: Payer: Self-pay | Admitting: *Deleted

## 2015-05-27 ENCOUNTER — Other Ambulatory Visit: Payer: Self-pay | Admitting: *Deleted

## 2015-05-27 NOTE — Telephone Encounter (Signed)
Scheduled reviewed with MD and revised correctly with new treatment plan dates.

## 2015-05-27 NOTE — Telephone Encounter (Signed)
TC received from pt regarding confusion of upcoming injection appointments.  [place] RN notified of pt confusion with schedule and will f/u with [name].  Pt instructed to obtain new schedule when she comes in for her injection appt on 05/28/15.  Pt has no other questions at this time and appreciated call back.

## 2015-05-28 ENCOUNTER — Other Ambulatory Visit: Payer: Self-pay | Admitting: *Deleted

## 2015-05-28 ENCOUNTER — Ambulatory Visit (HOSPITAL_BASED_OUTPATIENT_CLINIC_OR_DEPARTMENT_OTHER): Payer: 59

## 2015-05-28 VITALS — BP 123/73 | HR 98 | Temp 99.7°F

## 2015-05-28 DIAGNOSIS — C50919 Malignant neoplasm of unspecified site of unspecified female breast: Secondary | ICD-10-CM

## 2015-05-28 DIAGNOSIS — Z5189 Encounter for other specified aftercare: Secondary | ICD-10-CM

## 2015-05-28 DIAGNOSIS — C50312 Malignant neoplasm of lower-inner quadrant of left female breast: Secondary | ICD-10-CM | POA: Diagnosis not present

## 2015-05-28 DIAGNOSIS — C770 Secondary and unspecified malignant neoplasm of lymph nodes of head, face and neck: Secondary | ICD-10-CM | POA: Diagnosis not present

## 2015-05-28 MED ORDER — TBO-FILGRASTIM 300 MCG/0.5ML ~~LOC~~ SOSY
300.0000 ug | PREFILLED_SYRINGE | Freq: Once | SUBCUTANEOUS | Status: AC
  Administered 2015-05-28: 300 ug via SUBCUTANEOUS
  Filled 2015-05-28: qty 0.5

## 2015-05-28 MED ORDER — FILGRASTIM 300 MCG/0.5ML IJ SOSY: 300.0000 ug | PREFILLED_SYRINGE | Freq: Once | INTRAMUSCULAR | Status: DC

## 2015-05-28 NOTE — Patient Instructions (Signed)

## 2015-05-29 ENCOUNTER — Ambulatory Visit (HOSPITAL_BASED_OUTPATIENT_CLINIC_OR_DEPARTMENT_OTHER): Payer: 59 | Admitting: Nurse Practitioner

## 2015-05-29 ENCOUNTER — Inpatient Hospital Stay (HOSPITAL_COMMUNITY)
Admission: EM | Admit: 2015-05-29 | Discharge: 2015-05-30 | DRG: 181 | Disposition: A | Discharge status: Home / Self Care | Payer: 59 | Attending: Internal Medicine | Admitting: Internal Medicine

## 2015-05-29 ENCOUNTER — Other Ambulatory Visit: Payer: Self-pay

## 2015-05-29 ENCOUNTER — Inpatient Hospital Stay (HOSPITAL_COMMUNITY): Payer: 59

## 2015-05-29 ENCOUNTER — Emergency Department (HOSPITAL_COMMUNITY): Payer: 59

## 2015-05-29 ENCOUNTER — Encounter: Payer: Self-pay | Admitting: Nurse Practitioner

## 2015-05-29 ENCOUNTER — Ambulatory Visit (HOSPITAL_BASED_OUTPATIENT_CLINIC_OR_DEPARTMENT_OTHER): Payer: 59

## 2015-05-29 ENCOUNTER — Telehealth: Payer: Self-pay

## 2015-05-29 ENCOUNTER — Encounter (HOSPITAL_COMMUNITY): Payer: Self-pay | Admitting: Internal Medicine

## 2015-05-29 VITALS — BP 116/73 | HR 120 | Temp 99.5°F

## 2015-05-29 DIAGNOSIS — R0602 Shortness of breath: Secondary | ICD-10-CM | POA: Diagnosis present

## 2015-05-29 DIAGNOSIS — D72829 Elevated white blood cell count, unspecified: Secondary | ICD-10-CM | POA: Diagnosis present

## 2015-05-29 DIAGNOSIS — Z8673 Personal history of transient ischemic attack (TIA), and cerebral infarction without residual deficits: Secondary | ICD-10-CM

## 2015-05-29 DIAGNOSIS — R9431 Abnormal electrocardiogram [ECG] [EKG]: Secondary | ICD-10-CM | POA: Diagnosis present

## 2015-05-29 DIAGNOSIS — T451X5A Adverse effect of antineoplastic and immunosuppressive drugs, initial encounter: Secondary | ICD-10-CM

## 2015-05-29 DIAGNOSIS — F329 Major depressive disorder, single episode, unspecified: Secondary | ICD-10-CM | POA: Diagnosis present

## 2015-05-29 DIAGNOSIS — J810 Acute pulmonary edema: Secondary | ICD-10-CM

## 2015-05-29 DIAGNOSIS — K219 Gastro-esophageal reflux disease without esophagitis: Secondary | ICD-10-CM | POA: Diagnosis present

## 2015-05-29 DIAGNOSIS — C50919 Malignant neoplasm of unspecified site of unspecified female breast: Secondary | ICD-10-CM

## 2015-05-29 DIAGNOSIS — Z9013 Acquired absence of bilateral breasts and nipples: Secondary | ICD-10-CM | POA: Diagnosis present

## 2015-05-29 DIAGNOSIS — C770 Secondary and unspecified malignant neoplasm of lymph nodes of head, face and neck: Secondary | ICD-10-CM

## 2015-05-29 DIAGNOSIS — J90 Pleural effusion, not elsewhere classified: Secondary | ICD-10-CM | POA: Diagnosis not present

## 2015-05-29 DIAGNOSIS — Z923 Personal history of irradiation: Secondary | ICD-10-CM

## 2015-05-29 DIAGNOSIS — D630 Anemia in neoplastic disease: Secondary | ICD-10-CM | POA: Diagnosis present

## 2015-05-29 DIAGNOSIS — R51 Headache: Secondary | ICD-10-CM | POA: Diagnosis present

## 2015-05-29 DIAGNOSIS — G620 Drug-induced polyneuropathy: Secondary | ICD-10-CM

## 2015-05-29 DIAGNOSIS — F419 Anxiety disorder, unspecified: Secondary | ICD-10-CM | POA: Diagnosis present

## 2015-05-29 DIAGNOSIS — D899 Disorder involving the immune mechanism, unspecified: Secondary | ICD-10-CM | POA: Diagnosis present

## 2015-05-29 DIAGNOSIS — Z7982 Long term (current) use of aspirin: Secondary | ICD-10-CM | POA: Diagnosis not present

## 2015-05-29 DIAGNOSIS — R0902 Hypoxemia: Secondary | ICD-10-CM | POA: Diagnosis present

## 2015-05-29 DIAGNOSIS — Z9889 Other specified postprocedural states: Secondary | ICD-10-CM

## 2015-05-29 DIAGNOSIS — I4581 Long QT syndrome: Secondary | ICD-10-CM | POA: Diagnosis present

## 2015-05-29 DIAGNOSIS — R0609 Other forms of dyspnea: Secondary | ICD-10-CM

## 2015-05-29 DIAGNOSIS — K5909 Other constipation: Secondary | ICD-10-CM | POA: Diagnosis not present

## 2015-05-29 DIAGNOSIS — Z888 Allergy status to other drugs, medicaments and biological substances status: Secondary | ICD-10-CM | POA: Diagnosis not present

## 2015-05-29 DIAGNOSIS — C7800 Secondary malignant neoplasm of unspecified lung: Secondary | ICD-10-CM | POA: Diagnosis not present

## 2015-05-29 DIAGNOSIS — Z90710 Acquired absence of both cervix and uterus: Secondary | ICD-10-CM | POA: Diagnosis not present

## 2015-05-29 DIAGNOSIS — Z5189 Encounter for other specified aftercare: Secondary | ICD-10-CM | POA: Diagnosis not present

## 2015-05-29 DIAGNOSIS — C50312 Malignant neoplasm of lower-inner quadrant of left female breast: Secondary | ICD-10-CM | POA: Diagnosis not present

## 2015-05-29 DIAGNOSIS — C7989 Secondary malignant neoplasm of other specified sites: Secondary | ICD-10-CM | POA: Diagnosis present

## 2015-05-29 DIAGNOSIS — C50912 Malignant neoplasm of unspecified site of left female breast: Secondary | ICD-10-CM | POA: Diagnosis present

## 2015-05-29 DIAGNOSIS — G893 Neoplasm related pain (acute) (chronic): Secondary | ICD-10-CM | POA: Diagnosis not present

## 2015-05-29 DIAGNOSIS — Z993 Dependence on wheelchair: Secondary | ICD-10-CM

## 2015-05-29 DIAGNOSIS — J910 Malignant pleural effusion: Principal | ICD-10-CM | POA: Diagnosis present

## 2015-05-29 DIAGNOSIS — R071 Chest pain on breathing: Secondary | ICD-10-CM

## 2015-05-29 DIAGNOSIS — C787 Secondary malignant neoplasm of liver and intrahepatic bile duct: Secondary | ICD-10-CM

## 2015-05-29 LAB — BRAIN NATRIURETIC PEPTIDE: B Natriuretic Peptide: 29 pg/mL (ref 0.0–100.0)

## 2015-05-29 LAB — CBC WITH DIFFERENTIAL/PLATELET
Basophils Absolute: 0 10*3/uL (ref 0.0–0.1)
Basophils Relative: 0 % (ref 0–1)
EOS ABS: 0.2 10*3/uL (ref 0.0–0.7)
Eosinophils Relative: 1 % (ref 0–5)
HEMATOCRIT: 36.2 % (ref 36.0–46.0)
Hemoglobin: 11.3 g/dL — ABNORMAL LOW (ref 12.0–15.0)
Lymphocytes Relative: 4 % — ABNORMAL LOW (ref 12–46)
Lymphs Abs: 0.6 10*3/uL — ABNORMAL LOW (ref 0.7–4.0)
MCH: 29.8 pg (ref 26.0–34.0)
MCHC: 31.2 g/dL (ref 30.0–36.0)
MCV: 95.5 fL (ref 78.0–100.0)
MONO ABS: 0 10*3/uL — AB (ref 0.1–1.0)
Monocytes Relative: 0 % — ABNORMAL LOW (ref 3–12)
Neutro Abs: 14.7 10*3/uL — ABNORMAL HIGH (ref 1.7–7.7)
Neutrophils Relative %: 95 % — ABNORMAL HIGH (ref 43–77)
Platelets: 224 10*3/uL (ref 150–400)
RBC: 3.79 MIL/uL — ABNORMAL LOW (ref 3.87–5.11)
RDW: 20.9 % — AB (ref 11.5–15.5)
WBC: 15.6 10*3/uL — ABNORMAL HIGH (ref 4.0–10.5)

## 2015-05-29 LAB — URINALYSIS, ROUTINE W REFLEX MICROSCOPIC
Glucose, UA: NEGATIVE mg/dL
Hgb urine dipstick: NEGATIVE
Ketones, ur: NEGATIVE mg/dL
NITRITE: NEGATIVE
PH: 6 (ref 5.0–8.0)
Protein, ur: 30 mg/dL — AB
SPECIFIC GRAVITY, URINE: 1.03 (ref 1.005–1.030)
Urobilinogen, UA: 1 mg/dL (ref 0.0–1.0)

## 2015-05-29 LAB — BASIC METABOLIC PANEL
Anion gap: 6 (ref 5–15)
BUN: 17 mg/dL (ref 6–20)
CALCIUM: 8.2 mg/dL — AB (ref 8.9–10.3)
CO2: 27 mmol/L (ref 22–32)
Chloride: 105 mmol/L (ref 101–111)
Creatinine, Ser: 0.82 mg/dL (ref 0.44–1.00)
GFR calc Af Amer: >60 mL/min (ref >60)
GFR calc non Af Amer: >60 mL/min (ref >60)
Glucose, Bld: 128 mg/dL — ABNORMAL HIGH (ref 65–99)
Potassium: 3.8 mmol/L (ref 3.5–5.1)
Sodium: 138 mmol/L (ref 135–145)

## 2015-05-29 LAB — I-STAT TROPONIN, ED: Troponin i, poc: 0 ng/mL (ref 0.00–0.08)

## 2015-05-29 LAB — I-STAT CG4 LACTIC ACID, ED
LACTIC ACID, VENOUS: 1.97 mmol/L (ref 0.5–2.0)
LACTIC ACID, VENOUS: 2.55 mmol/L — AB (ref 0.5–2.0)

## 2015-05-29 LAB — URINE MICROSCOPIC-ADD ON

## 2015-05-29 MED ORDER — SODIUM CHLORIDE 0.9 % IJ SOLN
3.0000 mL | Freq: Two times a day (BID) | INTRAMUSCULAR | Status: DC
  Administered 2015-05-30: 3 mL via INTRAVENOUS

## 2015-05-29 MED ORDER — SODIUM CHLORIDE 0.9 % IV SOLN: 250.0000 mL | INTRAVENOUS | Status: DC | PRN

## 2015-05-29 MED ORDER — LORAZEPAM 2 MG/ML IJ SOLN
0.5000 mg | Freq: Once | INTRAMUSCULAR | Status: AC
  Administered 2015-05-29: 0.5 mg via INTRAVENOUS
  Filled 2015-05-29: qty 1

## 2015-05-29 MED ORDER — PANTOPRAZOLE SODIUM 40 MG PO TBEC
40.0000 mg | DELAYED_RELEASE_TABLET | Freq: Every day | ORAL | Status: DC
  Administered 2015-05-29 – 2015-05-30 (×2): 40 mg via ORAL
  Filled 2015-05-29 (×3): qty 1

## 2015-05-29 MED ORDER — ENOXAPARIN SODIUM 40 MG/0.4ML ~~LOC~~ SOLN
40.0000 mg | SUBCUTANEOUS | Status: DC
  Administered 2015-05-29: 40 mg via SUBCUTANEOUS
  Filled 2015-05-29: qty 0.4

## 2015-05-29 MED ORDER — IOHEXOL 350 MG/ML SOLN
100.0000 mL | Freq: Once | INTRAVENOUS | Status: AC | PRN
  Administered 2015-05-29: 80 mL via INTRAVENOUS

## 2015-05-29 MED ORDER — SODIUM CHLORIDE 0.9 % IJ SOLN: 3.0000 mL | INTRAMUSCULAR | Status: DC | PRN

## 2015-05-29 MED ORDER — MONTELUKAST SODIUM 10 MG PO TABS
10.0000 mg | ORAL_TABLET | Freq: Every day | ORAL | Status: DC
  Administered 2015-05-30: 10 mg via ORAL
  Filled 2015-05-29: qty 1

## 2015-05-29 MED ORDER — ASPIRIN EC 81 MG PO TBEC
81.0000 mg | DELAYED_RELEASE_TABLET | Freq: Every day | ORAL | Status: DC
  Administered 2015-05-30: 81 mg via ORAL
  Filled 2015-05-29: qty 1

## 2015-05-29 MED ORDER — TBO-FILGRASTIM 300 MCG/0.5ML ~~LOC~~ SOSY
300.0000 ug | PREFILLED_SYRINGE | Freq: Once | SUBCUTANEOUS | Status: AC
  Administered 2015-05-29: 300 ug via SUBCUTANEOUS
  Filled 2015-05-29: qty 0.5

## 2015-05-29 MED ORDER — HYDROMORPHONE HCL 1 MG/ML IJ SOLN
0.5000 mg | Freq: Once | INTRAMUSCULAR | Status: AC
  Administered 2015-05-29: 0.5 mg via INTRAVENOUS
  Filled 2015-05-29: qty 1

## 2015-05-29 MED ORDER — ACETAMINOPHEN 650 MG RE SUPP: 650.0000 mg | Freq: Four times a day (QID) | RECTAL | Status: DC | PRN

## 2015-05-29 MED ORDER — ACETAMINOPHEN 500 MG PO TABS
1000.0000 mg | ORAL_TABLET | Freq: Once | ORAL | Status: AC
  Administered 2015-05-29: 1000 mg via ORAL
  Filled 2015-05-29: qty 2

## 2015-05-29 MED ORDER — OXYCODONE-ACETAMINOPHEN 5-325 MG PO TABS
1.0000 | ORAL_TABLET | ORAL | Status: DC | PRN
  Administered 2015-05-29: 1 via ORAL
  Filled 2015-05-29: qty 1

## 2015-05-29 MED ORDER — ACETAMINOPHEN 325 MG PO TABS
650.0000 mg | ORAL_TABLET | Freq: Four times a day (QID) | ORAL | Status: DC | PRN
  Filled 2015-05-29: qty 2

## 2015-05-29 MED ORDER — SODIUM CHLORIDE 0.9 % IV BOLUS (SEPSIS)
500.0000 mL | Freq: Once | INTRAVENOUS | Status: AC
  Administered 2015-05-29: 500 mL via INTRAVENOUS

## 2015-05-29 MED ORDER — FILGRASTIM 300 MCG/0.5ML IJ SOSY
300.0000 ug | PREFILLED_SYRINGE | Freq: Once | INTRAMUSCULAR | Status: DC
  Filled 2015-05-29: qty 0.5

## 2015-05-29 MED ORDER — RISAQUAD PO CAPS
1.0000 | ORAL_CAPSULE | Freq: Every day | ORAL | Status: DC
  Administered 2015-05-30: 1 via ORAL
  Filled 2015-05-29 (×2): qty 1

## 2015-05-29 MED ORDER — LORAZEPAM 0.5 MG PO TABS
0.5000 mg | ORAL_TABLET | Freq: Every day | ORAL | Status: DC
  Administered 2015-05-29: 0.5 mg via ORAL
  Filled 2015-05-29: qty 1

## 2015-05-29 NOTE — ED Notes (Signed)
Lactic Acid= 2.55, MD Aline Brochure notified

## 2015-05-29 NOTE — Assessment & Plan Note (Signed)
Patient's most recent restaging scan revealed progression of her disease; with metastasis to the lungs, liver, left supraclavicular region, mediastinum, and pancreas.  Patient initiated cycle 1, day 1 of the new gemcitabine/carboplatin chemotherapy regimen this past Tuesday, 05/26/2015.  Patient presented to the [facility] today to receive day 2 out of 3 of her scheduled Granix injections for growth factor support.  Patient is scheduled to return tomorrow, 05/30/2015 for her third Granix injection.  She is scheduled to return on 06/02/2015 for labs, follow up visit, and her next cycle of chemotherapy.

## 2015-05-29 NOTE — Assessment & Plan Note (Signed)
Patient underwent a left thoracentesis on 05/22/2015 which removed 1.5 L of pleural fluid.  Patient states that she was feeling less short of breath.  Following the procedure.  Patient presented to the [facility] today to receive her second out of 3 scheduled Granix injections; and was complaining of progressive shortness of breath, generalized chest discomfort, and pain with inspiration.  She denies any specific cough or recent fevers or chills.  On exam.-Patient, no obvious rhonchi or rales.  Also, no wheezing or coughing noted.  O2 sat at 92% on room air.  Heart rate was between 139 and 141.  Blood pressure stable at 116/73.  Temperature was 99.5 while at the cancer center.  Patient does appear uncomfortable and mildly short of breath at rest.  Patient was placed on O2 at 2 L via nasal cannula.  Patient will be transported to the emergency department for further evaluation and management.  Brief history and report were called to the emergency department charge nurse prior to the patient being transferred to the emergency department via wheelchair.  Per the cancer Center nurse.  Differential diagnosis includes progressive pleural effusion, pulmonary embolism, pulmonary edema, or progression of disease.

## 2015-05-29 NOTE — ED Notes (Signed)
Pt reports SOB and CP for past week that has gradually gotten worse. Pt had thoracentesis a week ago. Last chemo Tuesday for breast ca.

## 2015-05-29 NOTE — ED Notes (Signed)
Upon entering room pt's O2 sat was 85% on RA.  2L O2 via [place] applied w/ an improvement in O2 sat to 94%.

## 2015-05-29 NOTE — ED Provider Notes (Signed)
CSN: [id number]     Arrival date & time 05/29/15  1437 History   First MD Initiated Contact with Patient 05/29/15 1501     Chief Complaint  Patient presents with  . Chest Pain  . Shortness of Breath     (Consider location/radiation/quality/duration/timing/severity/associated sxs/prior Treatment) HPI   Kristin Hoffman is a 49 year old female with stage IV breast cancer with multiple metastases, status post double mastectomy in 2011, currently receiving injections of granix and gemzar/carboplatin, and thoracentesis one week ago which drained 1.5L off of left lung, she presents to the ER today for worsening shortness of breath and chest tightness. Today feels worse than how she felt a week ago before her thoracentesis, she feels more fatigued, more severe exertional dyspnea, with a fast heart rate, and intermittent tightness in her chest which is sharp and occurs under her left breast, right breast and sometimes in her back when she is trying to take a breath. She feels lightheaded with positional changes, with associated transient visual spots/floaters, this also occurs with minimal activity, such as going to the restroom. She has not had any syncopal episodes. She reports a fever of 100, but no sweats or chills. She denies any orthopnea or PND, and has actually been able to rest okay. She denies any cough or cold symptoms or sick contacts. She denies any substernal chest pain or diaphoresis. She does not have any abdominal pain, lower extremity edema, or lower extremity discomfort. She has decreased appetite, but is able to eat and drink, and has not had any nausea, vomiting or diarrhea.   Past Medical History  Diagnosis Date  . Depression   . Reflux   . Hx-TIA (transient ischemic attack) 11/22/2013  . Chronic headaches 11/22/2013  . Anxiety 11/22/2013  . Breast cancer   . Cancer   . Breast cancer metastasized to multiple sites 12/24/2013   Past Surgical History  Procedure Laterality  Date  . Mastectomy w/ nodes partial    . Mastectomy    . Breast reconstruction    . Portacath placement      x 2  . Portacath removal      x 2  . Abdominal hysterectomy    . Tee without cardioversion  11/12/2012    Procedure: TRANSESOPHAGEAL ECHOCARDIOGRAM (TEE);  Surgeon: Candee Furbish, MD;  Location: [facility] ENDOSCOPY;  Service: Cardiovascular;  Laterality: N/A;  This TEE may be Dr. Marlou Porch or a [facility] Dr   No family history on file. History  Substance Use Topics  . Smoking status: Never Smoker   . Smokeless tobacco: Never Used  . Alcohol Use: Yes     Comment: rarely   OB History    No data available     Review of Systems  All other systems reviewed and are negative.   Allergies  Afinitor  Home Medications   Prior to Admission medications   Medication Sig Start Date End Date Taking? Authorizing Provider  acidophilus (RISAQUAD) CAPS capsule Take 1 capsule by mouth daily.   Yes Historical Provider, MD  aspirin EC 81 MG tablet Take 81 mg by mouth daily with breakfast.   Yes Historical Provider, MD  Biotin 5000 MCG CAPS Take 1 capsule (5,000 mcg total) by mouth daily. 02/27/15  Yes Chauncey Cruel, MD  cetirizine (ZYRTEC) 10 MG tablet Take 10 mg by mouth at bedtime.    Yes Historical Provider, MD  Cholecalciferol 2000 UNITS CHEW Chew 1 tablet by mouth daily with breakfast.    Yes  Historical Provider, MD  escitalopram (LEXAPRO) 20 MG tablet Take 20 mg by mouth daily with breakfast.    Yes Historical Provider, MD  Ibuprofen (ADVIL) 200 MG CAPS Take 2 capsules (400 mg total) by mouth 3 (three) times daily with meals as needed. Patient taking differently: Take 400 mg by mouth every 6 (six) hours as needed (for pain).  02/27/15  Yes Chauncey Cruel, MD  LORazepam (ATIVAN) 0.5 MG tablet TAKE 1 TABLET BY MOUTH AS NEEDED FOR NAUSEA AND VOMITING Patient taking differently: TAKE 1 TABLET BY MOUTH every night at bedtime 05/27/15  Yes Chauncey Cruel, MD  Melatonin 10 MG TABS Take 1 tablet by  mouth at bedtime.   Yes Historical Provider, MD  montelukast (SINGULAIR) 10 MG tablet Take 10 mg by mouth daily with breakfast.    Yes Historical Provider, MD  Multiple Vitamin (MULTIVITAMIN) tablet Take 1 tablet by mouth daily.   Yes Historical Provider, MD  nystatin (MYCOSTATIN) 100000 UNIT/ML suspension Take 5 mLs (500,000 Units total) by mouth 4 (four) times daily. Patient taking differently: Take 5 mLs by mouth daily as needed (for mouth sores).  04/10/15  Yes Chauncey Cruel, MD  omeprazole (PRILOSEC) 40 MG capsule Take 40 mg by mouth daily.  07/26/13  Yes Drake Leach, MD  ondansetron (ZOFRAN) 8 MG tablet Take 8 mg by mouth 2 (two) times daily. Starts day after chemo 01/25/15  Yes Historical Provider, MD  oxyCODONE-acetaminophen (PERCOCET/ROXICET) 5-325 MG per tablet Take 1 tablet by mouth every 4 (four) hours as needed for moderate pain.  05/26/15  Yes Historical Provider, MD  [place]   Yes Historical Provider, MD  prochlorperazine (COMPAZINE) 10 MG tablet Take 10 mg by mouth every 6 (six) hours as needed for nausea or vomiting.  12/13/14  Yes Historical Provider, MD  traMADol (ULTRAM) 50 MG tablet Take 1 tablet (50 mg total) by mouth every 6 (six) hours as needed. Patient taking differently: Take 50 mg by mouth every 6 (six) hours as needed for moderate pain.  05/08/15  Yes Virgie Dad Magrinat, MD   BP 106/64 mmHg  Pulse 83  Temp(Src) 98.8 F (37.1 C) (Oral)  Resp 24  SpO2 93% Physical Exam  Constitutional: She is oriented to person, place, and time. She appears well-developed.  Non-toxic appearance. She has a sickly appearance. She appears distressed.  Well developed woman, appears sickly, mildly distressed with breathing, able to speak in short sentences  HENT:  Head: Normocephalic and atraumatic.  Nose: Nose normal. No rhinorrhea.  Mouth/Throat: Uvula is midline and oropharynx is clear and moist. Mucous membranes are not pale, dry and not cyanotic. No oral  lesions. No oropharyngeal exudate.  Eyes: Conjunctivae, EOM and lids are normal. Pupils are equal, round, and reactive to light. Right eye exhibits no discharge. Left eye exhibits no discharge. Right conjunctiva is not injected. Left conjunctiva is not injected. No scleral icterus.  Palpebra conjunctiva - no pallor  Neck: Normal range of motion and full passive range of motion without pain. No JVD present. Tracheal tenderness present. No tracheal deviation present. Thyroid mass present. No thyromegaly present.    Cardiovascular: Regular rhythm, normal heart sounds, intact distal pulses and normal pulses.  Tachycardia present.  PMI is not displaced.  Exam reveals no gallop and no friction rub.   No murmur heard. Pulses:      Radial pulses are 2+ on the right side, and 2+ on the left side.  Dorsalis pedis pulses are 2+ on the right side, and 2+ on the left side.  Pulmonary/Chest: Effort normal. No accessory muscle usage. No respiratory distress. She has decreased breath sounds. She has no wheezes. She has no rhonchi. She has no rales. She exhibits no tenderness.  Chest rise symmetrical, mildly tachypnic w/o accessory muscle use, dull to percussion half way up left lung and at the base of right lung.  Decreased breath sounds bilaterally, RLL atelectasis, LLL no BS, coarse BS throughout upper lung fields  Abdominal: Soft. Normal appearance and bowel sounds are normal. She exhibits no distension and no mass. There is no tenderness. There is no rigidity, no rebound, no guarding and no CVA tenderness.  Musculoskeletal: Normal range of motion. She exhibits no edema or tenderness.  Lymphadenopathy:    She has no cervical adenopathy.  Neurological: She is alert and oriented to person, place, and time. She has normal reflexes. No cranial nerve deficit. She exhibits normal muscle tone. Coordination normal.  Skin: Skin is warm, dry and intact. No rash noted. She is not diaphoretic. No cyanosis or erythema.  No pallor. Nails show no clubbing.  Psychiatric: She has a normal mood and affect. Her behavior is normal. Judgment and thought content normal.  Nursing note and vitals reviewed.      ED Course  Procedures (including critical care time) Labs Review Labs Reviewed  BASIC METABOLIC PANEL - Abnormal; Notable for the following:    Glucose, Bld 128 (*)    Calcium 8.2 (*)    All other components within normal limits  I-STAT CG4 LACTIC ACID, ED - Abnormal; Notable for the following:    Lactic Acid, Venous 2.55 (*)    All other components within normal limits  URINE CULTURE  CULTURE, BLOOD (ROUTINE X 2)  CULTURE, BLOOD (ROUTINE X 2)  BRAIN NATRIURETIC PEPTIDE  URINALYSIS, ROUTINE W REFLEX MICROSCOPIC (NOT AT [facility])  CBC WITH DIFFERENTIAL/PLATELET  I-STAT TROPOININ, ED  I-STAT CG4 LACTIC ACID, ED    Imaging Review Dg Chest 1 View  05/29/2015   CLINICAL DATA:  Post LEFT thoracentesis, metastatic breast cancer  EXAM: CHEST  1 VIEW  COMPARISON:  Repeat exam 1633 hours compared to 1537 hours  FINDINGS: RIGHT jugular Port-A-Cath stable tip projecting over SVC.  Decrease in LEFT pleural effusion post thoracentesis.  No pneumothorax.  Persistent atelectasis infiltrate at LEFT base.  Large RIGHT basilar metastasis and scattered interstitial infiltrates in the mid lower RIGHT lung unchanged.  Thoracolumbar scoliosis and slight osseous demineralization.  IMPRESSION: No pneumothorax following LEFT thoracentesis.   Electronically Signed   By: Lavonia Dana M.D.   On: 05/29/2015 16:52   Dg Chest Port 1 View  05/29/2015   CLINICAL DATA:  Shortness of breath. Chest pain for the past week, worsening. Recent thoracentesis. Ongoing chemotherapy for breast cancer.  EXAM: PORTABLE CHEST - 1 VIEW  COMPARISON:  05/22/2015  FINDINGS: Power injectable right internal jugular Port-A-Cath tip: Cavoatrial junction.  Right middle lobe lung mass. Indistinct pulmonary vasculature. Abnormal bilateral interstitial accentuation.  Layering large left pleural effusion, mostly lateral. Indistinct left cardiac border. No pneumothorax.  IMPRESSION: 1. Large left pleural effusion with known large pleural mass on the left. 2. Right middle lobe lung mass. 3. Interstitial pulmonary edema.   Electronically Signed   By: Van Clines M.D.   On: 05/29/2015 15:48   US Thoracentesis Asp Pleural Space W/img Guide  05/29/2015   INDICATION: Symptomatic recurrent left sided pleural effusion  EXAM: US THORACENTESIS ASP PLEURAL  SPACE W/IMG GUIDE  COMPARISON:  CXR 05/29/15.  MEDICATIONS: None  COMPLICATIONS: None immediate  TECHNIQUE: Informed written consent was obtained from the patient after a discussion of the risks, benefits and alternatives to treatment. A timeout was performed prior to the initiation of the procedure.  Initial ultrasound scanning demonstrates a left pleural effusion. The lower chest was prepped and draped in the usual sterile fashion. 1% lidocaine was used for local anesthesia.  Under direct ultrasound guidance, a 19 gauge, 7-cm, Yueh catheter was introduced. An ultrasound image was saved for documentation purposes. The thoracentesis was performed. The catheter was removed and a dressing was applied. The patient tolerated the procedure well without immediate post procedural complication. The patient was escorted to have an upright chest radiograph.  FINDINGS: A total of approximately 1.2 liters of serous/amber fluid was removed.  IMPRESSION: Successful ultrasound-guided left sided thoracentesis yielding 1.2 liters of pleural fluid.  Read By:  Tsosie Billing PA-C   Electronically Signed   By: Corrie Mckusick D.O.   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  She received injections yesterday and today of granix, which is to boost WBC counts to counteract chemo induced neutropenia.  She has been tachycardic   SOB:  DDx - pleural effusion, pulmonary edema and ? PNA  Plan:  Pt on room O2 with improved sats, Dr. Aline Brochure has seen pt and with beside [place] visualized pleural effusions bilaterally L>R, is calling interventional radiology for availability to get therapeutic thoracentesis. Ordered BCx2 and rectal temp - pt is immunocompromised, so will have a low threshold and begin Abx tx for possible PNA/HCAP, with gentle fluids Will monitor pt's mild tachycardia, suspect this is likely multifactorial, will give tylenol, gentle fluids, and O2 via [place] and watch for response.  Suspect pt will need to be admitted for further workup and treatment Dr. Aline Brochure handling IR consult, hold of fluids and any IV abx at this time to watch how the patient does after thoracentesis.   7:29 PM Patient was given Ativan and Dilaudid and had thoracentesis done by IR Her dyspnea has improved, as well as her chest tightness, she complains of some intermittent pains at the site of thoracentesis and behind her shoulder blades.  She is requesting to go home, explained to the patient need for observation to make sure she is stable and able to oxygenate on room air I paced patient back on a monitor, room air highest O2 sat was 88%, patient was then placed on 2 L nasal cannula with improvement of O2 sats to 95%.  She is still mildly tachycardic.  We will get modified orthostatics, only laying,  sitting, to evaluate for  need of a fluid bolus.  Most recent chest x-ray still concerning for interstitial edema, may need Lasix.  Patient explained that the hematologist that a side effect of her new injections may be a raised temperature, she was given instructions to call the office with any fever above 100.5. This is reassuring with rectal temp at 100.4, makes pneumonia less likely. Pt was not orthostatic, will not give fluids at this time.  7:29 PM I have tried to wean her oxygen to room air without success.  Highest saturation on room air is 88%, Dr. Aline Brochure will handle the consult call for admission.  Patient has been expressing her desire to leave and go home and sleep.   Dr. Aline Brochure resumed care of pt at this time.     Delsa Grana, PA-C 05/30/15 6060  Pamella Pert, MD 05/30/15 1017

## 2015-05-29 NOTE — ED Notes (Signed)
Bed: DQ22 Expected date:  Expected time:  Means of arrival:  Comments: Cancer patient

## 2015-05-29 NOTE — ED Notes (Signed)
Pt's O2 sat 95% on 2L [place]. Was on 4L [place] prior to thoracentesis.

## 2015-05-29 NOTE — Telephone Encounter (Signed)
-----   Message from Perlie Gold sent at 05/26/2015  4:08 PM EDT ----- Regarding: Chemo Follow up call- Magrinat Contact: [phone] First time Gemzar/Carboplatin. [name] patient. Please call.  Thanks,  Barnetta Chapel, RN

## 2015-05-29 NOTE — Procedures (Signed)
Successful US guided left thoracentesis. Yielded 1.2 liters of amber colored fluid. Pt tolerated procedure well. No immediate complications.  Specimen was not sent for labs. CXR ordered.  Tsosie Billing D PA-C 05/29/2015 4:31 PM

## 2015-05-29 NOTE — Progress Notes (Signed)
SYMPTOM MANAGEMENT CLINIC   HPI: Kristin Hoffman 49 y.o. female diagnosed with breast cancer; with metastasis to multiple sites.  Patient's most recent restaging scan revealed progression of her disease; with metastasis to the lungs, liver, left supraclavicular region, mediastinum, and pancreas.  Patient initiated cycle 1, day 1 of the new gemcitabine/carboplatin chemotherapy regimen this past Tuesday, 05/26/2015.  Patient underwent a left thoracentesis on 05/22/2015 which removed 1.5 L of pleural fluid.  Patient states that she was feeling less short of breath.  Following the procedure.  Patient presented to the [facility] today to receive her second out of 3 scheduled Granix injections; and was complaining of progressive shortness of breath, generalized chest discomfort, and pain with inspiration.  She denies any specific cough or recent fevers or chills.  On exam.-Patient, no obvious rhonchi or rales.  Also, no wheezing or coughing noted.  O2 sat at 92% on room air.  Heart rate was between 139 and 141.  Blood pressure stable at 116/73.  Temperature was 99.5 while at the cancer center.  Patient does appear uncomfortable and mildly short of breath at rest.  Patient was placed on O2 at 2 L via nasal cannula.  Patient will be transported to the emergency department for further evaluation and management.  Brief history and report were called to the emergency department charge nurse prior to the patient being transferred to the emergency department via wheelchair.  Per the cancer Center nurse.  Differential diagnosis includes progressive pleural effusion, pulmonary embolism, pulmonary edema, or progression of disease.  HPI  ROS  Past Medical History  Diagnosis Date  . Depression   . Reflux   . Hx-TIA (transient ischemic attack) 11/22/2013  . Chronic headaches 11/22/2013  . Anxiety 11/22/2013  . Breast cancer   . Cancer   . Breast cancer metastasized to multiple sites 12/24/2013     Past Surgical History  Procedure Laterality Date  . Mastectomy w/ nodes partial    . Mastectomy    . Breast reconstruction    . Portacath placement      x 2  . Portacath removal      x 2  . Abdominal hysterectomy    . Tee without cardioversion  11/12/2012    Procedure: TRANSESOPHAGEAL ECHOCARDIOGRAM (TEE);  Surgeon: Candee Furbish, MD;  Location: [facility] ENDOSCOPY;  Service: Cardiovascular;  Laterality: N/A;  This TEE may be Dr. Marlou Porch or a LHC Dr    has Hx-TIA (transient ischemic attack); Chronic headaches; Anxiety; Cancer of lower-inner quadrant of left female breast; Breast cancer metastasized to multiple sites; Drug induced neutropenia(288.03); Back spasm; Chemotherapy-induced neuropathy; Heart burn; Anemia in neoplastic disease; Nausea with vomiting; Mucositis due to chemotherapy; Anorexia; Neoplastic malignant related fatigue; Dehydration; Diarrhea; Palmar plantar erythrodysesthesia; and SOB (shortness of breath) on her problem list.    is allergic to afinitor.    Medication List    Notice    This visit is during an admission. Changes to the med list made in this visit will be reflected in the After Visit Summary of the admission.       PHYSICAL EXAMINATION  Oncology Vitals 05/29/2015 05/29/2015 05/29/2015 05/29/2015 05/29/2015 05/29/2015 05/28/2015  Height - - - - - - -  Weight - - - - - - -  Weight (lbs) - - - - - - -  BMI (kg/m2) - - - - - - -  Temp 100.4 - - - 99.7 99.5 99.7  Pulse - 115 116 - 130 120 98  Resp - 32 28 - 20 - -  Resp (Historical as of 07/12/12) - - - - - - -  SpO2 - 92 94 92 92 - -  BSA (m2) - - - - - - -   BP Readings from Last 3 Encounters:  05/29/15 101/53  05/29/15 116/73  05/28/15 123/73    Physical Exam  Constitutional: She is oriented to person, place, and time.  Patient appears fatigued, uncomfortable, and chronically ill.  HENT:  Head: Normocephalic and atraumatic.  Mouth/Throat: Oropharynx is clear and moist.  Eyes: Conjunctivae and EOM are  normal. Pupils are equal, round, and reactive to light. Right eye exhibits no discharge. Left eye exhibits no discharge. No scleral icterus.  Neck: Normal range of motion. Neck supple. No JVD present. No tracheal deviation present. No thyromegaly present.  Cardiovascular: Intact distal pulses.   Tachycardia rate between 139 and 141.  Pulmonary/Chest: No stridor. She is in respiratory distress. She has no wheezes. She has no rales. She exhibits no tenderness.  Decreased breath sounds bilaterally.  No obvious cough or wheeze.  Mild shortness of breath on exam.  Patient placed on 2 L O2 via nasal cannula.  Abdominal: Soft. Bowel sounds are normal. She exhibits no distension and no mass. There is no tenderness. There is no rebound and no guarding.  Musculoskeletal: Normal range of motion. She exhibits no edema or tenderness.  Lymphadenopathy:    She has no cervical adenopathy.  Neurological: She is alert and oriented to person, place, and time.  Skin: Skin is warm and dry. No rash noted. No erythema. There is pallor.  Psychiatric:  Patient appears tearful and anxious.  Nursing note and vitals reviewed.   LABORATORY DATA:. Admission on 05/29/2015  Component Date Value Ref Range Status  . B Natriuretic Peptide 05/29/2015 29.0  0.0 - 100.0 pg/mL Final  . Sodium 05/29/2015 138  135 - 145 mmol/L Final  . Potassium 05/29/2015 3.8  3.5 - 5.1 mmol/L Final  . Chloride 05/29/2015 105  101 - 111 mmol/L Final  . CO2 05/29/2015 27  22 - 32 mmol/L Final  . Glucose, Bld 05/29/2015 128* 65 - 99 mg/dL Final  . BUN 05/29/2015 17  6 - 20 mg/dL Final  . Creatinine, Ser 05/29/2015 0.82  0.44 - 1.00 mg/dL Final  . Calcium 05/29/2015 8.2* 8.9 - 10.3 mg/dL Final  . GFR calc non Af Amer 05/29/2015 >60  >60 mL/min Final  . GFR calc Af Amer 05/29/2015 >60  >60 mL/min Final   Comment: (NOTE) The eGFR has been calculated using the CKD EPI equation. This calculation has not been validated in all clinical  situations. eGFR's persistently <60 mL/min signify possible Chronic Kidney Disease.   . Anion gap 05/29/2015 6  5 - 15 Final  . Troponin i, poc 05/29/2015 0.00  0.00 - 0.08 ng/mL Final  . Comment 3 05/29/2015          Final   Comment: Due to the release kinetics of cTnI, a negative result within the first hours of the onset of symptoms does not rule out myocardial infarction with certainty. If myocardial infarction is still suspected, repeat the test at appropriate intervals.   . Lactic Acid, Venous 05/29/2015 1.97  0.5 - 2.0 mmol/L Final     RADIOGRAPHIC STUDIES: Dg Chest 1 View  05/29/2015   CLINICAL DATA:  Post LEFT thoracentesis, metastatic breast cancer  EXAM: CHEST  1 VIEW  COMPARISON:  Repeat exam 1633 hours compared to 1537  hours  FINDINGS: RIGHT jugular Port-A-Cath stable tip projecting over SVC.  Decrease in LEFT pleural effusion post thoracentesis.  No pneumothorax.  Persistent atelectasis infiltrate at LEFT base.  Large RIGHT basilar metastasis and scattered interstitial infiltrates in the mid lower RIGHT lung unchanged.  Thoracolumbar scoliosis and slight osseous demineralization.  IMPRESSION: No pneumothorax following LEFT thoracentesis.   Electronically Signed   By: Lavonia Dana M.D.   On: 05/29/2015 16:52   Dg Chest Port 1 View  05/29/2015   CLINICAL DATA:  Shortness of breath. Chest pain for the past week, worsening. Recent thoracentesis. Ongoing chemotherapy for breast cancer.  EXAM: PORTABLE CHEST - 1 VIEW  COMPARISON:  05/22/2015  FINDINGS: Power injectable right internal jugular Port-A-Cath tip: Cavoatrial junction.  Right middle lobe lung mass. Indistinct pulmonary vasculature. Abnormal bilateral interstitial accentuation. Layering large left pleural effusion, mostly lateral. Indistinct left cardiac border. No pneumothorax.  IMPRESSION: 1. Large left pleural effusion with known large pleural mass on the left. 2. Right middle lobe lung mass. 3. Interstitial pulmonary edema.    Electronically Signed   By: Van Clines M.D.   On: 05/29/2015 15:48   US Thoracentesis Asp Pleural Space W/img Guide  05/29/2015   INDICATION: Symptomatic recurrent left sided pleural effusion  EXAM: US THORACENTESIS ASP PLEURAL SPACE W/IMG GUIDE  COMPARISON:  CXR 05/29/15.  MEDICATIONS: None  COMPLICATIONS: None immediate  TECHNIQUE: Informed written consent was obtained from the patient after a discussion of the risks, benefits and alternatives to treatment. A timeout was performed prior to the initiation of the procedure.  Initial ultrasound scanning demonstrates a left pleural effusion. The lower chest was prepped and draped in the usual sterile fashion. 1% lidocaine was used for local anesthesia.  Under direct ultrasound guidance, a 19 gauge, 7-cm, Yueh catheter was introduced. An ultrasound image was saved for documentation purposes. The thoracentesis was performed. The catheter was removed and a dressing was applied. The patient tolerated the procedure well without immediate post procedural complication. The patient was escorted to have an upright chest radiograph.  FINDINGS: A total of approximately 1.2 liters of serous/amber fluid was removed.  IMPRESSION: Successful ultrasound-guided left sided thoracentesis yielding 1.2 liters of pleural fluid.  Read By:  Tsosie Billing PA-C   Electronically Signed   By: Corrie Mckusick D.O.   On: 05/29/2015 16:38    ASSESSMENT/PLAN:    Cancer of lower-inner quadrant of left female breast Patient's most recent restaging scan revealed progression of her disease; with metastasis to the lungs, liver, left supraclavicular region, mediastinum, and pancreas.  Patient initiated cycle 1, day 1 of the new gemcitabine/carboplatin chemotherapy regimen this past Tuesday, 05/26/2015.  Patient presented to the [facility] today to receive day 2 out of 3 of her scheduled Granix injections for growth factor support.  Patient is scheduled to return tomorrow,  05/30/2015 for her third Granix injection.  She is scheduled to return on 06/02/2015 for labs, follow up visit, and her next cycle of chemotherapy.  Chemotherapy-induced neuropathy Patient underwent a left thoracentesis on 05/22/2015 which removed 1.5 L of pleural fluid.  Patient states that she was feeling less short of breath.  Following the procedure.  Patient presented to the [facility] today to receive her second out of 3 scheduled Granix injections; and was complaining of progressive shortness of breath, generalized chest discomfort, and pain with inspiration.  She denies any specific cough or recent fevers or chills.  On exam.-Patient, no obvious rhonchi or rales.  Also, no wheezing  or coughing noted.  O2 sat at 92% on room air.  Heart rate was between 139 and 141.  Blood pressure stable at 116/73.  Temperature was 99.5 while at the cancer center.  Patient does appear uncomfortable and mildly short of breath at rest.  Patient was placed on O2 at 2 L via nasal cannula.  Patient will be transported to the emergency department for further evaluation and management.  Brief history and report were called to the emergency department charge nurse prior to the patient being transferred to the emergency department via wheelchair.  Per the cancer Center nurse.  Differential diagnosis includes progressive pleural effusion, pulmonary embolism, pulmonary edema, or progression of disease.    Patient stated understanding of all instructions; and was in agreement with this plan of care. The patient knows to call the clinic with any problems, questions or concerns.   Review/collaboration with Dr. Alvy Bimler (on call for [name]) regarding all aspects of patient's visit today.   Total time spent with patient was 40 minutes;  with greater than 75 percent of that time spent in face to face counseling regarding patient's symptoms,  and coordination of care and follow up.  Disclaimer: This note was  dictated with voice recognition software. Similar sounding words can inadvertently be transcribed and may not be corrected upon review.   Drue Second, NP 05/29/2015

## 2015-05-29 NOTE — ED Notes (Signed)
Pt transported to IR. Per staff will return in 30 minutes.

## 2015-05-29 NOTE — H&P (Addendum)
PCP: Drake Leach, MD  Oncology [place]  Referring provider Aline Brochure    Chief Complaint: Shortness of breath  HPI: Kristin Hoffman is a 49 y.o. female   has a past medical history of Depression; Reflux; TIA (transient ischemic attack) (11/22/2013); Chronic headaches (11/22/2013); Anxiety (11/22/2013); Breast cancer; Cancer; and Breast cancer metastasized to multiple sites (12/24/2013).   Presented with Worsening shortness of breath. She has been having worsening SOB for past 1.5 weeks with occasional pleuritic chest pain but this AM she was strugling to breat.. She still went to Cancer center to get her Neupogen shot. Nursing stuff noted severe shortness of breath and recommended ehr going to ER> She was seen and evaluated in emergency department CXR showed recurrent pleural effusions. Ultrasound-guided thoracentesis was done producing 1.2 L of serous amber fluid. Patient had improvement in her shortness of breath but was noted to be persistently hypoxic. She never required oxygen in the past. Still has some pleuritic chest pain.  Patient has history of stage IV breast cancer status post bilateral mastectomy with metastatic spread to left subcortical area, lung liver pancreas and hepatoduodenal ligament. She has a metastatic left pleural effusion requiring recurrent thoracentesis. She was seen on June 12 by her oncologist and was advised that her cancer has progressed. Patient had a time decided to proceed with aggressive chemotherapy regimen. Patient was started on new regimen of chemotherapy and on 14th followed by 2 days of Neupogen injection she supposed to get her third shot tomorrow. The last thoracentesis prior to this was one week ago.  Patient endorses some low-grade fevers up to 100. Denies any nausea vomiting she have not had any cough. No lower extremity swelling. No nausea vomiting no diarrhea. She does have worsening shortness of breath and chest tightness. She feels like she is  having past heartbeat. Endorses feeling lightheaded and she tries to sit up. She have had some visual spots and floaters she has had for some time now. Endorses shortness of breath with minimal activity.  Hospitalist was called for admission for recurrent pleural effusion and hypoxia  Review of Systems:    Pertinent positives include:   Fevers, chest tightness, shortness of breath at rest.  dyspnea on exertion,  Constitutional:  No weight loss, night sweats, chills, fatigue, weight loss  HEENT:  No headaches, Difficulty swallowing,Tooth/dental problems,Sore throat,  No sneezing, itching, ear ache, nasal congestion, post nasal drip,  Cardio-vascular:  No chest pain, Orthopnea, PND, anasarca, dizziness, palpitations.no Bilateral lower extremity swelling  GI:  No heartburn, indigestion, abdominal pain, nausea, vomiting, diarrhea, change in bowel habits, loss of appetite, melena, blood in stool, hematemesis Resp:  no  No excess mucus, no productive cough, No non-productive cough, No coughing up of blood.No change in color of mucus.No wheezing. Skin:  no rash or lesions. No jaundice GU:  no dysuria, change in color of urine, no urgency or frequency. No straining to urinate.  No flank pain.  Musculoskeletal:  No joint pain or no joint swelling. No decreased range of motion. No back pain.  Psych:  No change in mood or affect. No depression or anxiety. No memory loss.  Neuro: no localizing neurological complaints, no tingling, no weakness, no double vision, no gait abnormality, no slurred speech, no confusion  Otherwise ROS are negative except for above, 10 systems were reviewed  Past Medical History: Past Medical History  Diagnosis Date  . Depression   . Reflux   . Hx-TIA (transient ischemic attack) 11/22/2013  . Chronic  headaches 11/22/2013  . Anxiety 11/22/2013  . Breast cancer   . Cancer   . Breast cancer metastasized to multiple sites 12/24/2013   Past Surgical History    Procedure Laterality Date  . Mastectomy w/ nodes partial    . Mastectomy    . Breast reconstruction    . Portacath placement      x 2  . Portacath removal      x 2  . Abdominal hysterectomy    . Tee without cardioversion  11/12/2012    Procedure: TRANSESOPHAGEAL ECHOCARDIOGRAM (TEE);  Surgeon: Candee Furbish, MD;  Location: [facility] ENDOSCOPY;  Service: Cardiovascular;  Laterality: N/A;  This TEE may be Dr. Marlou Porch or a LHC Dr     Medications: Prior to Admission medications   Medication Sig Start Date End Date Taking? Authorizing Provider  acidophilus (RISAQUAD) CAPS capsule Take 1 capsule by mouth daily.   Yes Historical Provider, MD  aspirin EC 81 MG tablet Take 81 mg by mouth daily with breakfast.   Yes Historical Provider, MD  Biotin 5000 MCG CAPS Take 1 capsule (5,000 mcg total) by mouth daily. 02/27/15  Yes Chauncey Cruel, MD  cetirizine (ZYRTEC) 10 MG tablet Take 10 mg by mouth at bedtime.    Yes Historical Provider, MD  Cholecalciferol 2000 UNITS CHEW Chew 1 tablet by mouth daily with breakfast.    Yes Historical Provider, MD  escitalopram (LEXAPRO) 20 MG tablet Take 20 mg by mouth daily with breakfast.    Yes Historical Provider, MD  Ibuprofen (ADVIL) 200 MG CAPS Take 2 capsules (400 mg total) by mouth 3 (three) times daily with meals as needed. Patient taking differently: Take 400 mg by mouth every 6 (six) hours as needed (for pain).  02/27/15  Yes Chauncey Cruel, MD  LORazepam (ATIVAN) 0.5 MG tablet TAKE 1 TABLET BY MOUTH AS NEEDED FOR NAUSEA AND VOMITING Patient taking differently: TAKE 1 TABLET BY MOUTH every night at bedtime 05/27/15  Yes Chauncey Cruel, MD  Melatonin 10 MG TABS Take 1 tablet by mouth at bedtime.   Yes Historical Provider, MD  montelukast (SINGULAIR) 10 MG tablet Take 10 mg by mouth daily with breakfast.    Yes Historical Provider, MD  Multiple Vitamin (MULTIVITAMIN) tablet Take 1 tablet by mouth daily.   Yes Historical Provider, MD  nystatin (MYCOSTATIN)  100000 UNIT/ML suspension Take 5 mLs (500,000 Units total) by mouth 4 (four) times daily. Patient taking differently: Take 5 mLs by mouth daily as needed (for mouth sores).  04/10/15  Yes Chauncey Cruel, MD  omeprazole (PRILOSEC) 40 MG capsule Take 40 mg by mouth daily.  07/26/13  Yes Drake Leach, MD  ondansetron (ZOFRAN) 8 MG tablet Take 8 mg by mouth 2 (two) times daily. Starts day after chemo 01/25/15  Yes Historical Provider, MD  oxyCODONE-acetaminophen (PERCOCET/ROXICET) 5-325 MG per tablet Take 1 tablet by mouth every 4 (four) hours as needed for moderate pain.  05/26/15  Yes Historical Provider, MD  [place]   Yes Historical Provider, MD  prochlorperazine (COMPAZINE) 10 MG tablet Take 10 mg by mouth every 6 (six) hours as needed for nausea or vomiting.  12/13/14  Yes Historical Provider, MD  traMADol (ULTRAM) 50 MG tablet Take 1 tablet (50 mg total) by mouth every 6 (six) hours as needed. Patient taking differently: Take 50 mg by mouth every 6 (six) hours as needed for moderate pain.  05/08/15  Yes Chauncey Cruel, MD  Allergies:   Allergies  Allergen Reactions  . 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 She has never used smokeless tobacco. She reports that she drinks alcohol. She reports that she does not use illicit drugs.    Family History: family history is negative for Diabetes Mellitus II and Hypertension.    Physical Exam: Patient Vitals for the past 24 hrs:  BP Temp Temp src Pulse Resp SpO2  05/29/15 1927 106/64 mmHg 98.8 F (37.1 C) Oral 83 24 93 %  05/29/15 1900 104/62 mmHg - - 88 20 (!) 88 %  05/29/15 1830 109/69 mmHg - - 93 25 94 %  05/29/15 1800 98/69 mmHg - - 95 (!) 27 94 %  05/29/15 1749 108/72 mmHg - - 108 25  95 %  05/29/15 1748 101/66 mmHg - - 95 22 94 %  05/29/15 1733 104/66 mmHg - - 97 26 94 %  05/29/15 1629 (!) 101/53 mmHg - - - - -  05/29/15 1626 105/65 mmHg - - - - -  05/29/15 1620 96/69 mmHg - - - - -  05/29/15 1614 - 100.4 F (38 C) Rectal - - -  05/29/15 1600 103/73 mmHg - - 115 (!) 32 92 %  05/29/15 1530 116/86 mmHg - - 116 (!) 28 94 %  05/29/15 1442 - - - - - 92 %  05/29/15 1441 120/84 mmHg 99.7 F (37.6 C) Oral (!) 130 20 92 %    1. General:  in No Acute distress 2. Psychological: Alert and   Oriented 3. Head/ENT:     Dry Mucous Membranes                          Head Non traumatic, neck supple                          Normal  Dentition 4. SKIN:   decreased Skin turgor,  Skin clean Dry and intact no rash 5. Heart: Regular rate and rhythm no Murmur, Rub or gallop 6. Lungs:   no wheezes some crackles, diminished BS on the left 7. Abdomen: Soft, non-tender, Non distended 8. Lower extremities: no clubbing, cyanosis, or edema 9. Neurologically Grossly intact, moving all 4 extremities equally 10. MSK: Normal range of motion  body mass index is unknown because there is no weight on file.   Labs on Admission:   Results for orders placed or performed during the hospital encounter of 05/29/15 (from the past 24 hour(s))  CBC with Differential/Platelet     Status: Abnormal   Collection Time: 05/29/15  3:19 PM  Result Value Ref Range   WBC 15.6 (H) 4.0 - 10.5 K/uL   RBC 3.79 (L) 3.87 - 5.11 MIL/uL   Hemoglobin 11.3 (L) 12.0 - 15.0 g/dL   HCT 36.2 36.0 - 46.0 %   MCV 95.5 78.0 - 100.0 fL   MCH 29.8 26.0 - 34.0 pg   MCHC 31.2 30.0 - 36.0 g/dL   RDW 20.9 (H) 11.5 - 15.5 %   Platelets 224 150 - 400 K/uL   Neutrophils Relative % 95 (H) 43 - 77 %   Neutro Abs 14.7 (H) 1.7 - 7.7  K/uL   Lymphocytes Relative 4 (L) 12 - 46 %   Lymphs Abs 0.6 (L) 0.7 - 4.0 K/uL   Monocytes Relative 0 (L) 3 - 12 %   Monocytes Absolute 0.0 (L) 0.1 - 1.0 K/uL   Eosinophils Relative 1 0 - 5 %    Eosinophils Absolute 0.2 0.0 - 0.7 K/uL   Basophils Relative 0 0 - 1 %   Basophils Absolute 0.0 0.0 - 0.1 K/uL  BNP (order ONLY if patient complains of dyspnea/SOB AND you have documented it for THIS visit)     Status: None   Collection Time: 05/29/15  3:21 PM  Result Value Ref Range   B Natriuretic Peptide 29.0 0.0 - 100.0 pg/mL  Basic metabolic panel     Status: Abnormal   Collection Time: 05/29/15  3:21 PM  Result Value Ref Range   Sodium 138 135 - 145 mmol/L   Potassium 3.8 3.5 - 5.1 mmol/L   Chloride 105 101 - 111 mmol/L   CO2 27 22 - 32 mmol/L   Glucose, Bld 128 (H) 65 - 99 mg/dL   BUN 17 6 - 20 mg/dL   Creatinine, Ser 0.82 0.44 - 1.00 mg/dL   Calcium 8.2 (L) 8.9 - 10.3 mg/dL   GFR calc non Af Amer >60 >60 mL/min   GFR calc Af Amer >60 >60 mL/min   Anion gap 6 5 - 15  I-stat troponin, ED  (not at [facility], [facility])     Status: None   Collection Time: 05/29/15  3:26 PM  Result Value Ref Range   Troponin i, poc 0.00 0.00 - 0.08 ng/mL   Comment 3          I-Stat CG4 Lactic Acid, ED (Not at [facility] or [facility])     Status: None   Collection Time: 05/29/15  3:29 PM  Result Value Ref Range   Lactic Acid, Venous 1.97 0.5 - 2.0 mmol/L  I-Stat CG4 Lactic Acid, ED (Not at [facility] or [facility])     Status: Abnormal   Collection Time: 05/29/15  6:33 PM  Result Value Ref Range   Lactic Acid, Venous 2.55 (HH) 0.5 - 2.0 mmol/L   Comment NOTIFIED PHYSICIAN     UA not obtained  Lab Results  Component Value Date   HGBA1C 5.6 11/08/2012    Estimated Creatinine Clearance: 85 mL/min (by C-G formula based on Cr of 0.82).  BNP (last 3 results) No results for input(s): PROBNP in the last 8760 hours.  Other results:  I have pearsonaly reviewed this: ECG REPORT  Rate:123  Rhythm: Sinus tachycardia,  ST&T Change: Mild ST changes QTC 643  There were no vitals filed for this visit.   Cultures: No results found for: [place], [place], CULT, REPTSTATUS   Radiological Exams on Admission: Dg Chest 1  View  05/29/2015   CLINICAL DATA:  Post LEFT thoracentesis, metastatic breast cancer  EXAM: CHEST  1 VIEW  COMPARISON:  Repeat exam 1633 hours compared to 1537 hours  FINDINGS: RIGHT jugular Port-A-Cath stable tip projecting over SVC.  Decrease in LEFT pleural effusion post thoracentesis.  No pneumothorax.  Persistent atelectasis infiltrate at LEFT base.  Large RIGHT basilar metastasis and scattered interstitial infiltrates in the mid lower RIGHT lung unchanged.  Thoracolumbar scoliosis and slight osseous demineralization.  IMPRESSION: No pneumothorax following LEFT thoracentesis.   Electronically Signed   By: Lavonia Dana M.D.   On: 05/29/2015 16:52   Dg Chest Port 1 View  05/29/2015   CLINICAL  DATA:  Shortness of breath. Chest pain for the past week, worsening. Recent thoracentesis. Ongoing chemotherapy for breast cancer.  EXAM: PORTABLE CHEST - 1 VIEW  COMPARISON:  05/22/2015  FINDINGS: Power injectable right internal jugular Port-A-Cath tip: Cavoatrial junction.  Right middle lobe lung mass. Indistinct pulmonary vasculature. Abnormal bilateral interstitial accentuation. Layering large left pleural effusion, mostly lateral. Indistinct left cardiac border. No pneumothorax.  IMPRESSION: 1. Large left pleural effusion with known large pleural mass on the left. 2. Right middle lobe lung mass. 3. Interstitial pulmonary edema.   Electronically Signed   By: Van Clines M.D.   On: 05/29/2015 15:48   US Thoracentesis Asp Pleural Space W/img Guide  05/29/2015   INDICATION: Symptomatic recurrent left sided pleural effusion  EXAM: US THORACENTESIS ASP PLEURAL SPACE W/IMG GUIDE  COMPARISON:  CXR 05/29/15.  MEDICATIONS: None  COMPLICATIONS: None immediate  TECHNIQUE: Informed written consent was obtained from the patient after a discussion of the risks, benefits and alternatives to treatment. A timeout was performed prior to the initiation of the procedure.  Initial ultrasound scanning demonstrates a left pleural  effusion. The lower chest was prepped and draped in the usual sterile fashion. 1% lidocaine was used for local anesthesia.  Under direct ultrasound guidance, a 19 gauge, 7-cm, Yueh catheter was introduced. An ultrasound image was saved for documentation purposes. The thoracentesis was performed. The catheter was removed and a dressing was applied. The patient tolerated the procedure well without immediate post procedural complication. The patient was escorted to have an upright chest radiograph.  FINDINGS: A total of approximately 1.2 liters of serous/amber fluid was removed.  IMPRESSION: Successful ultrasound-guided left sided thoracentesis yielding 1.2 liters of pleural fluid.  Read By:  Tsosie Billing PA-C   Electronically Signed   By: Corrie Mckusick D.O.   On: 05/29/2015 16:38    Chart has been reviewed  Family not  at  Bedside but has a friend with her    Assessment/Plan 61 yo F with history of malignant breast cancer with recurrent pleural effusion presents with worsening shortness of breath pleuritic chest pain, was noted to have pleural effusion which was drained but had persistent hypoxia admitted for further workup and arrangement for home oxygen as well as Pleural catheter placement Present on Admission:  . Breast cancer metastasized to multiple sites - discussed with oncology Dr. Alvy Bimler was notified [name] will see patient tomorrow  . Hypoxia - most likely secondary to worsening pleural effusion. But given sudden onset of pleuritic chest pain different from prior will obtain CTA of the chest to rule out PE  . Pleural effusion - recurrent effusion requiring repeated thoracentesis likely malignant. Patient at this point interested in pleural catheter placement any other long term palaitive modality. Will need to discuss options with IR in AM for this to be arranged  . Leukocytosis - in a setting of recent Neupogen administration, low-grade fever likely secondary to Neupogen. If evidence  of pneumonia on CT though need to initiate treatment. Tachycardia resolved after thoracentesis. It appears that no studies was sent on obtained fluid.  Prolonged QT - monitor on tele avoid QT prolonging medications, repeat ECG Prophylaxis:  Lovenox   CODE STATUS:  FULL CODE   as per patient    Disposition:  To home once workup is complete and patient is stable  Other plan as per orders.  I have spent a total of 65 min on this admission extra time was taken to discuss case with oncology Dr.  Kara Pacer 05/29/2015, 8:11 PM  [facility]  Pager [phone]   after 2 AM please page floor coverage PA If 7AM-7PM, please contact the day team taking care of the patient  [email or web address]  Password [email or web address]

## 2015-05-29 NOTE — Telephone Encounter (Signed)
lvm f/u on chemo tolerance. She was in office for granix shot today and yesterday. If she has any issues or concerns please call us.

## 2015-05-30 ENCOUNTER — Ambulatory Visit: Payer: 59

## 2015-05-30 DIAGNOSIS — G893 Neoplasm related pain (acute) (chronic): Secondary | ICD-10-CM

## 2015-05-30 DIAGNOSIS — C50919 Malignant neoplasm of unspecified site of unspecified female breast: Secondary | ICD-10-CM

## 2015-05-30 DIAGNOSIS — R0902 Hypoxemia: Secondary | ICD-10-CM

## 2015-05-30 DIAGNOSIS — C50312 Malignant neoplasm of lower-inner quadrant of left female breast: Secondary | ICD-10-CM

## 2015-05-30 DIAGNOSIS — R0602 Shortness of breath: Secondary | ICD-10-CM

## 2015-05-30 DIAGNOSIS — J90 Pleural effusion, not elsewhere classified: Secondary | ICD-10-CM | POA: Insufficient documentation

## 2015-05-30 DIAGNOSIS — D72829 Elevated white blood cell count, unspecified: Secondary | ICD-10-CM

## 2015-05-30 DIAGNOSIS — R0609 Other forms of dyspnea: Secondary | ICD-10-CM

## 2015-05-30 DIAGNOSIS — K5909 Other constipation: Secondary | ICD-10-CM

## 2015-05-30 DIAGNOSIS — C799 Secondary malignant neoplasm of unspecified site: Secondary | ICD-10-CM

## 2015-05-30 LAB — CBC WITH DIFFERENTIAL/PLATELET
BASOS PCT: 0 % (ref 0–1)
Basophils Absolute: 0 10*3/uL (ref 0.0–0.1)
EOS PCT: 4 % (ref 0–5)
Eosinophils Absolute: 0.4 10*3/uL (ref 0.0–0.7)
HCT: 33.8 % — ABNORMAL LOW (ref 36.0–46.0)
Hemoglobin: 10.7 g/dL — ABNORMAL LOW (ref 12.0–15.0)
Lymphocytes Relative: 7 % — ABNORMAL LOW (ref 12–46)
Lymphs Abs: 0.7 10*3/uL (ref 0.7–4.0)
MCH: 29.7 pg (ref 26.0–34.0)
MCHC: 31.7 g/dL (ref 30.0–36.0)
MCV: 93.9 fL (ref 78.0–100.0)
Monocytes Absolute: 0 10*3/uL — ABNORMAL LOW (ref 0.1–1.0)
Monocytes Relative: 0 % — ABNORMAL LOW (ref 3–12)
NEUTROS ABS: 8.7 10*3/uL — AB (ref 1.7–7.7)
NEUTROS PCT: 89 % — AB (ref 43–77)
Platelets: 180 10*3/uL (ref 150–400)
RBC: 3.6 MIL/uL — ABNORMAL LOW (ref 3.87–5.11)
RDW: 20.6 % — AB (ref 11.5–15.5)
WBC: 9.8 10*3/uL (ref 4.0–10.5)

## 2015-05-30 LAB — COMPREHENSIVE METABOLIC PANEL
ALBUMIN: 2.4 g/dL — AB (ref 3.5–5.0)
ALT: 76 U/L — AB (ref 14–54)
AST: 193 U/L — ABNORMAL HIGH (ref 15–41)
Alkaline Phosphatase: 88 U/L (ref 38–126)
Anion gap: 7 (ref 5–15)
BUN: 16 mg/dL (ref 6–20)
CO2: 27 mmol/L (ref 22–32)
Calcium: 8.2 mg/dL — ABNORMAL LOW (ref 8.9–10.3)
Chloride: 104 mmol/L (ref 101–111)
Creatinine, Ser: 0.68 mg/dL (ref 0.44–1.00)
GFR calc Af Amer: >60 mL/min (ref >60)
GFR calc non Af Amer: >60 mL/min (ref >60)
Glucose, Bld: 86 mg/dL (ref 65–99)
Potassium: 3.7 mmol/L (ref 3.5–5.1)
Sodium: 138 mmol/L (ref 135–145)
TOTAL PROTEIN: 4.8 g/dL — AB (ref 6.5–8.1)
Total Bilirubin: 1 mg/dL (ref 0.3–1.2)

## 2015-05-30 LAB — PHOSPHORUS: Phosphorus: 2.9 mg/dL (ref 2.5–4.6)

## 2015-05-30 LAB — MAGNESIUM: Magnesium: 1.8 mg/dL (ref 1.7–2.4)

## 2015-05-30 LAB — TSH: TSH: 4.436 u[IU]/mL (ref 0.350–4.500)

## 2015-05-30 MED ORDER — HEPARIN SOD (PORK) LOCK FLUSH 100 UNIT/ML IV SOLN
500.0000 [IU] | INTRAVENOUS | Status: AC | PRN
  Administered 2015-05-30: 500 [IU]

## 2015-05-30 MED ORDER — POLYETHYLENE GLYCOL 3350 17 G PO PACK
17.0000 g | PACK | Freq: Every day | ORAL | Status: DC
  Administered 2015-05-30: 17 g via ORAL
  Filled 2015-05-30: qty 1

## 2015-05-30 MED ORDER — SODIUM CHLORIDE 0.9 % IJ SOLN
10.0000 mL | INTRAMUSCULAR | Status: DC | PRN
  Administered 2015-05-30 (×2): 10 mL
  Filled 2015-05-30 (×2): qty 40

## 2015-05-30 MED ORDER — TBO-FILGRASTIM 300 MCG/0.5ML ~~LOC~~ SOSY
300.0000 ug | PREFILLED_SYRINGE | Freq: Once | SUBCUTANEOUS | Status: AC
  Administered 2015-05-30: 300 ug via SUBCUTANEOUS
  Filled 2015-05-30: qty 0.5

## 2015-05-30 MED ORDER — OXYCODONE HCL 5 MG PO TABS
5.0000 mg | ORAL_TABLET | Freq: Four times a day (QID) | ORAL | Status: DC
  Administered 2015-05-30: 5 mg via ORAL
  Filled 2015-05-30 (×2): qty 1

## 2015-05-30 MED ORDER — POLYETHYLENE GLYCOL 3350 17 G PO PACK: 17.0000 g | PACK | Freq: Every day | ORAL | Status: DC

## 2015-05-30 MED ORDER — DOCUSATE SODIUM 100 MG PO CAPS
100.0000 mg | ORAL_CAPSULE | Freq: Two times a day (BID) | ORAL | Status: DC
  Administered 2015-05-30: 100 mg via ORAL
  Filled 2015-05-30: qty 1

## 2015-05-30 MED ORDER — DOCUSATE SODIUM 100 MG PO CAPS: 100.0000 mg | ORAL_CAPSULE | Freq: Two times a day (BID) | ORAL | Status: DC

## 2015-05-30 NOTE — Care Management Note (Signed)
Case Management Note  Patient Details  Name: Kristin Hoffman MRN: [id number] Date of Birth: [birth year]  Subjective/Objective:     Breast cancer               Action/Plan:  Home Expected Discharge Date:  05/30/2015           Expected Discharge Plan:  Home/Self Care      Discharge planning Services  CM Consult  Post Acute Care Choice:    Choice offered to:     DME Arranged:  Oxygen DME Agency:  [facility].   Status of Service:  Completed, signed off  Medicare Important Message Given:  No Date Medicare IM Given:    Medicare IM give by:    Date Additional Medicare IM Given:    Additional Medicare Important Message give by:     If discussed at [place] of Stay Meetings, dates discussed:    Additional Comments: Consulted for home oxygen. NCM spoke to pt and explained [facility] will deliver oxygen to room and deliver portable to home.  Erenest Rasher, RN 05/30/2015, 12:55 PM

## 2015-05-30 NOTE — Progress Notes (Signed)
SATURATION QUALIFICATIONS: (This note is used to comply with regulatory documentation for home oxygen)  Patient Saturations on Room Air at Rest = 81%  Patient Saturations on Room Air while Ambulating = 97%  Patient Saturations on  2 Liters of oxygen while Ambulating = 99%  Please briefly explain why patient needs home oxygen:

## 2015-05-30 NOTE — Progress Notes (Signed)
Kristin Hoffman   DOB:[birth year]   ON#:[id number]   KGM#:[id number]  Subjective: Jesus came by the office yesterday to receive her 3d dose of neupogen, but was again severely SOB and hypoxic; she was evaluated in the ED and admitted with repeat CT chest showing no PE, but with recurrent L effusion, which was tapped yesterday. Today she is still SOB, but doing better on O2--sats off O2 85%. Is able to ambulate to BR. Denies productive cough, pleurisy or hemptysis. -- She is day 5 cycle 1 of carboplatin/ gemcitabine which she receives day 1 and day 8 of each 21 days cycle. Family in room   Objective: young White woman examined in bed Filed Vitals:   05/30/15 0521  BP: 106/71  Pulse: 87  Temp: 98.2 F (36.8 C)  Resp: 20    Body mass index is 31.41 kg/(m^2).  Intake/Output Summary (Last 24 hours) at 05/30/15 0841 Last data filed at 05/30/15 0522  Gross per 24 hour  Intake    490 ml  Output      0 ml  Net    490 ml     Sclerae unicteric  Oropharynx no thrush  Crackles Left base  Heart regular rate and rhythm  Abdomen soft, NT, + BS  Neuro nonfocal  Breast exam: deferred  CBG (last 3)  No results for input(s): GLUCAP in the last 72 hours.   Labs:  Lab Results  Component Value Date   WBC 9.8 05/30/2015   HGB 10.7* 05/30/2015   HCT 33.8* 05/30/2015   MCV 93.9 05/30/2015   PLT 180 05/30/2015   NEUTROABS 8.7* 05/30/2015    _0 @  Urine Studies No results for input(s): UHGB, CRYS in the last 72 hours.  Invalid input(s): UACOL, UAPR, USPG, UPH, UTP, UGL, UKET, UBIL, UNIT, UROB, ULEU, UEPI, UWBC, URBC, UBAC, CAST, UCOM, BILUA  Basic Metabolic Panel:  Recent Labs Lab 05/25/15 1350 05/29/15 1521 05/30/15 0525  NA 138 138 138  K 3.3* 3.8 3.7  CL  --  105 104  CO2 _1 GLUCOSE 143* 128* 86  BUN 11._2 CREATININE 0.8 0.82 0.68  CALCIUM 8.5 8.2* 8.2*  MG  --   --  1.8  PHOS  --   --  2.9   GFR Estimated Creatinine Clearance: 87.3 mL/min (by C-G  formula based on Cr of 0.68). Liver Function Tests:  Recent Labs Lab 05/25/15 1350 05/30/15 0525  AST 46* 193*  ALT 11 76*  ALKPHOS 82 88  BILITOT 0.83 1.0  PROT 5.4* 4.8*  ALBUMIN 2.5* 2.4*   No results for input(s): LIPASE, AMYLASE in the last 168 hours. No results for input(s): AMMONIA in the last 168 hours. Coagulation profile No results for input(s): INR, PROTIME in the last 168 hours.  CBC:  Recent Labs Lab 05/25/15 1350 05/29/15 1519 05/30/15 0525  WBC 7.3 15.6* 9.8  NEUTROABS 5.7 14.7* 8.7*  HGB 12.3 11.3* 10.7*  HCT 37.8 36.2 33.8*  MCV 91.3 95.5 93.9  PLT 306 224 180   Cardiac Enzymes: No results for input(s): CKTOTAL, CKMB, CKMBINDEX, TROPONINI in the last 168 hours. BNP: Invalid input(s): POCBNP CBG: No results for input(s): GLUCAP in the last 168 hours. D-Dimer No results for input(s): DDIMER in the last 72 hours. Hgb A1c No results for input(s): HGBA1C in the last 72 hours. Lipid Profile No results for input(s): CHOL, HDL, LDLCALC, TRIG, CHOLHDL, LDLDIRECT in the last 72 hours. Thyroid function studies  Recent Labs  05/30/15 0525  TSH 4.436   Anemia work up No results for input(s): VITAMINB12, FOLATE, FERRITIN, TIBC, IRON, RETICCTPCT in the last 72 hours. Microbiology No results found for this or any previous visit (from the past 240 hour(s)).    Studies:  Dg Chest 1 View  05/29/2015   CLINICAL DATA:  Post LEFT thoracentesis, metastatic breast cancer  EXAM: CHEST  1 VIEW  COMPARISON:  Repeat exam 1633 hours compared to 1537 hours  FINDINGS: RIGHT jugular Port-A-Cath stable tip projecting over SVC.  Decrease in LEFT pleural effusion post thoracentesis.  No pneumothorax.  Persistent atelectasis infiltrate at LEFT base.  Large RIGHT basilar metastasis and scattered interstitial infiltrates in the mid lower RIGHT lung unchanged.  Thoracolumbar scoliosis and slight osseous demineralization.  IMPRESSION: No pneumothorax following LEFT  thoracentesis.   Electronically Signed   By: Lavonia Dana M.D.   On: 05/29/2015 16:52   Ct Angio Chest Pe W/cm &/or Wo Cm  05/29/2015   CLINICAL DATA:  Worsening shortness of breath for 1.5 weeks. Now with chest pain on breathing.  EXAM: CT ANGIOGRAPHY CHEST WITH CONTRAST  TECHNIQUE: Multidetector CT imaging of the chest was performed using the standard protocol during bolus administration of intravenous contrast. Multiplanar CT image reconstructions and MIPs were obtained to evaluate the vascular anatomy.  CONTRAST:  42m OMNIPAQUE IOHEXOL 350 MG/ML SOLN  COMPARISON:  05/22/2015  FINDINGS: Technically adequate study with good opacification of the central and segmental pulmonary arteries. No focal filling defects demonstrated. No evidence of significant pulmonary embolus.  Mild cardiac enlargement. Normal caliber thoracic aorta. No aortic dissection or aneurysm. Great vessel origins are patent. Aberrant right subclavian artery. Calcified lymph nodes in the mediastinum could represent old granulomatous infection or treated tumor. Enlarged lymph node in the left supraclavicular region measuring 3.6 cm. Enlarged right paratracheal lymph node measuring 2.4 cm. These are similar in size to prior study and consistent with metastasis. Large bilateral pleural masses, measuring 11.8 x 6.1 cm on the left and 3.4 cm on the right. Small bilateral pleural effusions. Basilar atelectasis. 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 Appearances are similar to prior study. Multiple metastases in the liver with heterogeneous parenchymal enhancement pattern corresponding to known portal venous compression. Portal veins are not entirely included within the study for comparison. Bilateral breast implants. No destructive bone lesions are appreciated.  Review of the MIP images confirms the above findings.  IMPRESSION: Diffuse metastatic disease in the chest involving left  clavicular and right paratracheal lymph nodes, extensive pleural metastases bilaterally, bilateral pleural effusions, interstitial and nodular parenchymal metastases, and liver metastases. Appearances are similar to previous study. No evidence of significant pulmonary embolus.   Electronically Signed   By: WLucienne CapersM.D.   On: 05/29/2015 23:17   Dg Chest Port 1 View  05/29/2015   CLINICAL DATA:  Shortness of breath. Chest pain for the past week, worsening. Recent thoracentesis. Ongoing chemotherapy for breast cancer.  EXAM: PORTABLE CHEST - 1 VIEW  COMPARISON:  05/22/2015  FINDINGS: Power injectable right internal jugular Port-A-Cath tip: Cavoatrial junction.  Right middle lobe lung mass. Indistinct pulmonary vasculature. Abnormal bilateral interstitial accentuation. Layering large left pleural effusion, mostly lateral. Indistinct left cardiac border. No pneumothorax.  IMPRESSION: 1. Large left pleural effusion with known large pleural mass on the left. 2. Right middle lobe lung mass. 3. Interstitial pulmonary edema.   Electronically Signed   By: WVan ClinesM.D.   On: 05/29/2015 15:48  US Thoracentesis Asp Pleural Space W/img Guide  05/29/2015   INDICATION: Symptomatic recurrent left sided pleural effusion  EXAM: US THORACENTESIS ASP PLEURAL SPACE W/IMG GUIDE  COMPARISON:  CXR 05/29/15.  MEDICATIONS: None  COMPLICATIONS: None immediate  TECHNIQUE: Informed written consent was obtained from the patient after a discussion of the risks, benefits and alternatives to treatment. A timeout was performed prior to the initiation of the procedure.  Initial ultrasound scanning demonstrates a left pleural effusion. The lower chest was prepped and draped in the usual sterile fashion. 1% lidocaine was used for local anesthesia.  Under direct ultrasound guidance, a 19 gauge, 7-cm, Yueh catheter was introduced. An ultrasound image was saved for documentation purposes. The thoracentesis was performed. The  catheter was removed and a dressing was applied. The patient tolerated the procedure well without immediate post procedural complication. The patient was escorted to have an upright chest radiograph.  FINDINGS: A total of approximately 1.2 liters of serous/amber fluid was removed.  IMPRESSION: Successful ultrasound-guided left sided thoracentesis yielding 1.2 liters of pleural fluid.  Read By:  Tsosie Billing PA-C   Electronically Signed   By: Corrie Mckusick D.O.   On: 05/29/2015 16:38    Assessment: 49 y.o.  [place] woman with stage IV breast cancer admitted with worsening SOB, s/p repeat L thoracentesis 05/29/2015 (previous 05/22/2015), other problems being pain, constipation, anxiety, persistent hypoxia  (1) status post left lumpectomy under the care of Dr. 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 2 of 23 lymph nodes were involved. Margins were clear.  (2) status post adjuvant chemotherapy consisting of 6 q. three-week doses of docetaxel/ doxorubicin/ cyclophosphamide given between January 2007 and may of 2007, with last dose on 04/20/2006.  (3) status post radiation therapy to the left breast, completed 07/11/2006  (4) began on tamoxifen in early August 2007 and continued until October 2009. She status post hysterectomy with bilateral salpingo-oophorectomy on 07/15/2008, and was started on letrozole in October of 2009.  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 PET and CT of the chest showed no evidence of metastasis in January 2011.  (6) status post bilateral mastectomies 01/25/2010, with the right breast showing no evidence of malignancy; in the left breast showing a 1.0 cm grade 2 invasive ductal carcinoma, ER +22%, PR negative, HER-2/neu negative, with proliferation  marker of 13%. Margins were clear. Patient underwent concurrent left latissimus flap reconstruction and right implant reconstruction.  (7) status post additional chemotherapy with one cycle of carboplatin/gemcitabine given on 03/11/2010. She then received 5 q. three-week cycles of IV CMF between 03/25/2010 and 06/17/2010.  (8) started on exemestane in January 2012 and continued until late November 2013 when she was hospitalized for apparent TIA at which time her exemestane was stopped and not resumed  (9) METASTATIC DISEASE: evidence of disease recurence noted on chest CT, liver MRI and PET scan in December 2014, with suspicious lung nodules, lymphadenopathy, and 3 lesions in the right lobe of the liver, but no evidence of bony disease and no evidence of brain involvement by brain MRI September 2014. Biopsy of a left supraclavicular lymph node on 12/11/2013 confirmed metastatic carcinoma, estrogen receptor 45% positive, progesterone receptor 17% positive, with no HER-2 amplification.  (10) fulvestrant started 12/03/2013, discontinued 03/25/2014 with progression  (11) exemestane/ everolimus started 04/04/2014; everolimus held 04/14/2014,  with skin rash and mouth soreness; resumed at 5 mg a day as of 04/29/2014, discontinued 05/09/2014 with similar symptoms  (12) continueing exemestane, adding palbociclib 05/26/2014;  (a) palbociclib dose dropped to 75 mg every other day for 21 days beginning with cycle 4 (b) exemestane and Palbociclib discontinued December 2015 with evidence of progression  (13) [facility] referral placed 04/28/2049 -- re-sent 05/23/2014-- shows Pi3KCA and TP53 mutations (a) [facility] one test requested 12/10/2014-- confirms [employer] results (b) did not qualify for capecitabine/ BYL 719 trial because of elevated lipase  (14) started eribulin 12/26/2014, stopped 01/23/2015 after 2 cycles because of neuropathy; repeat liver MRI 02/25/2015  shows progression   (15) capecitabine started 03/23/2015, initially 2 weeks 1 week off, then every other week starting with cycle 2.  (a) dpse dropped from 2g BID to 1.5g BID starting on 04/26/15 (b) stopped 05/17/2015 (after 4 cycles) with progression  (16) started carboplatin/ gemcitabine 05/26/2015, to be given day one and day 8 of each 21 day cycle (a) given her history of severe neutropenia with this chemotherapy we'll do Neupogen days 2, 3 and 4 of each cycle and onpro day 9   Plan: patient feels if home O2 can be arranged she can go home today. I agree. I am writing pain meds, bowel prophylaxis, and her final neupogen order. I will contact IR in AM 06/01/2015 to arrange for L pleurx placement ASAP. Patient already has appt w [place] 06/02/2015 w labs, visit and day 8 dose of chemo  Greatly appreciate your help to this patient!   Chauncey Cruel, MD 05/30/2015  8:41 AM Medical Oncology and Hematology [facility] [address] [place], [place] [address] Tel. [phone]    Fax. [phone]

## 2015-05-30 NOTE — Discharge Summary (Signed)
Physician Discharge Summary  Kristin Hoffman EZM:[id number] DOB: [birth year] DOA: 05/29/2015  PCP: Drake Leach, MD  Admit date: 05/29/2015 Discharge date: 05/30/2015  Time spent: 60 minutes  Recommendations for Outpatient Follow-up:  1. Follow-up with [name] as scheduled on 06/02/2015. 2. Oncology will call interventional radiology on Monday, 06/01/2015, to schedule patient for an appointment for Pleurx catheter placement.  Discharge Diagnoses:  Principal Problem:   Dyspnea on exertion Active Problems:   Breast cancer metastasized to multiple sites   Anemia in neoplastic disease   Hypoxia   Pleural effusion   Leukocytosis   Prolonged QT interval   Recurrent pleural effusion on left   Discharge Condition: Stable and improved  Diet recommendation: Regular  Filed Weights   05/29/15 2157  Weight: 80.4 kg (177 lb 4 oz)    History of present illness:  Per Dr. Adria Hoffman is a 49 y.o. female   has a past medical history of Depression; Reflux; TIA (transient ischemic attack) (11/22/2013); Chronic headaches (11/22/2013); Anxiety (11/22/2013); Breast cancer; Cancer; and Breast cancer metastasized to multiple sites (12/24/2013).   Presented with Worsening shortness of breath. She had been having worsening SOB for past 1.5 weeks with occasional pleuritic chest pain but on the morning of admission, she was strugling to breathe.. She still went to Cancer center to get her Neupogen shot. Nursing stuff noted severe shortness of breath and recommended to patient to go to ER> She was seen and evaluated in emergency department CXR showed recurrent pleural effusions. Ultrasound-guided thoracentesis was done producing 1.2 L of serous amber fluid. Patient had improvement in her shortness of breath but was noted to be persistently hypoxic. She never required oxygen in the past. Still had some pleuritic chest pain.  Patient has history of stage IV breast cancer status post bilateral  mastectomy with metastatic spread to left subcortical area, lung liver pancreas and hepatoduodenal ligament. She has a metastatic left pleural effusion requiring recurrent thoracentesis. She was seen on June 12 by her oncologist and was advised that her cancer had progressed. Patient had at that time decided to proceed with aggressive chemotherapy regimen. Patient was started on new regimen of chemotherapy and on 14th followed by 2 days of Neupogen injection she supposed to get her third shot on 05/30/2015. The last thoracentesis prior to this was one week  prior to admission. Patient endorsed some low-grade fevers up to 100. Denied any nausea vomiting she had not had any cough. No lower extremity swelling. No nausea vomiting no diarrhea. She did have worsening shortness of breath and chest tightness. She felt like she was having fast heartbeat. Endorsed feeling lightheaded when she tries to sit up. She has had some visual spots and floaters she has had for some time now. Endorsed shortness of breath with minimal activity.  Hospitalist was called for admission for recurrent pleural effusion and hypoxia   Hospital Course:   #1 dyspnea on exertion/shortness of breath Likely multifactorial secondary to recurrent probably malignant pleural effusion and widely metastatic stage IV breast cancer. Patient status post left thoracentesis yielding 1.2 L of amber-colored fluid done in the emergency room on admission. Patient with some clinical improvement. Patient's O2 sats on room air was 85%. CT angiogram of the chest was done which was negative for PE or any acute infiltrate. Patient remained afebrile. Patient was seen in consultation by oncology who felt patient was stable for discharge if home oxygen could be arranged. Oncology did state they would contact interventional radiology  on the morning of 06/01/2015 to arrange for left Pleurx catheter placement ASAP. Patient will also follow-up with oncology on  06/02/2015. It was felt per oncology the patient was stable for discharge and no further workup was needed at this time. Patient will be discharged in stable and improved condition and will follow-up with oncology as outpatient.  #2 stage IV breast cancer Patient has been seen by oncology during this hospitalization and patient will follow-up with oncology as outpatient to receive lab work and ongoing chemotherapy. Outpatient follow-up with oncology area  #3 recurrent pleural effusion left Likely a malignant pleural effusion. Patient has needed recurrent thoracentesis secondary to worsening shortness of breath and recurrent effusions. Patient has been seen by oncology. Patient is status post thoracentesis in the emergency department on admission would 1.2 L of amber-colored fluid removed. Fluid was not sent for any labs. Patient with some clinical improvement. Patient has been seen by oncology were recommending that patient may be stable for discharge on home O2 and oncology will call interventional radiology on the morning of 06/01/2015 to arrange for left Pleurx catheter placement ASAP.  #4 leukocytosis Likely secondary to recent Neupogen. CT chest negative for any acute infiltrate. Patient is currently afebrile. Urinalysis with small leukocytes nitrite negative. WBC trending down and currently normalized and 9.8. No antibiotics needed at this time. Outpatient follow-up.   Procedures:  CT angiogram chest 05/29/2015  Chest x-ray 05/29/2015  Left thoracentesis 05/29/2015 per ED PA, Tsosie Billing, PA --1.2 L amber colored fluid aspirated.  Consultations:  Oncology: Dr Jana Hakim 05/30/15  Discharge Exam: Filed Vitals:   05/30/15 0521  BP: 106/71  Pulse: 87  Temp: 98.2 F (36.8 C)  Resp: 20    General: NAD Cardiovascular: RRR Respiratory: CTAB  Discharge Instructions   Discharge Instructions    Diet general    Complete by:  As directed      Discharge instructions    Complete by:   As directed   Follow up with Dr Jana Hakim as scheduled.     Increase activity slowly    Complete by:  As directed           Current Discharge Medication List    START taking these medications   Details  docusate sodium (COLACE) 100 MG capsule Take 1 capsule (100 mg total) by mouth 2 (two) times daily. Qty: 20 capsule, Refills: 0   Associated Diagnoses: Breast cancer metastasized to multiple sites, unspecified laterality    polyethylene glycol (MIRALAX / GLYCOLAX) packet Take 17 g by mouth daily. Qty: 14 each, Refills: 0   Associated Diagnoses: Breast cancer metastasized to multiple sites, unspecified laterality      CONTINUE these medications which have NOT CHANGED   Details  acidophilus (RISAQUAD) CAPS capsule Take 1 capsule by mouth daily.    aspirin EC 81 MG tablet Take 81 mg by mouth daily with breakfast.    Biotin 5000 MCG CAPS Take 1 capsule (5,000 mcg total) by mouth daily. Qty: 30 capsule    cetirizine (ZYRTEC) 10 MG tablet Take 10 mg by mouth at bedtime.    Associated Diagnoses: Malignant neoplasm of breast (female), unspecified site; Unspecified vitamin D deficiency; Other screening mammogram    Cholecalciferol 2000 UNITS CHEW Chew 1 tablet by mouth daily with breakfast.     escitalopram (LEXAPRO) 20 MG tablet Take 20 mg by mouth daily with breakfast.    Associated Diagnoses: Malignant neoplasm of breast (female), unspecified site; Unspecified vitamin D deficiency; Other screening mammogram  Ibuprofen (ADVIL) 200 MG CAPS Take 2 capsules (400 mg total) by mouth 3 (three) times daily with meals as needed. Qty: 120 each, Refills: 0    LORazepam (ATIVAN) 0.5 MG tablet TAKE 1 TABLET BY MOUTH AS NEEDED FOR NAUSEA AND VOMITING Qty: 30 tablet, Refills: 0    Melatonin 10 MG TABS Take 1 tablet by mouth at bedtime.    montelukast (SINGULAIR) 10 MG tablet Take 10 mg by mouth daily with breakfast.     Multiple Vitamin (MULTIVITAMIN) tablet Take 1 tablet by mouth daily.     nystatin (MYCOSTATIN) 100000 UNIT/ML suspension Take 5 mLs (500,000 Units total) by mouth 4 (four) times daily. Qty: 240 mL, Refills: 1    omeprazole (PRILOSEC) 40 MG capsule Take 40 mg by mouth daily.     ondansetron (ZOFRAN) 8 MG tablet Take 8 mg by mouth 2 (two) times daily. Starts day after chemo    oxyCODONE-acetaminophen (PERCOCET/ROXICET) 5-325 MG per tablet Take 1 tablet by mouth every 4 (four) hours as needed for moderate pain.  Refills: 0    PRESCRIPTION MEDICATION Chemo - [facility]    prochlorperazine (COMPAZINE) 10 MG tablet Take 10 mg by mouth every 6 (six) hours as needed for nausea or vomiting.     traMADol (ULTRAM) 50 MG tablet Take 1 tablet (50 mg total) by mouth every 6 (six) hours as needed. Qty: 30 tablet, Refills: 3       Allergies  Allergen Reactions  . Afinitor [Everolimus] Hives and Itching   Follow-up Information    Follow up with Chauncey Cruel, MD.   Specialty:  Oncology   Why:  f/u as scheduled   Contact information:   [place] [address] [phone]        The results of significant diagnostics from this hospitalization (including imaging, microbiology, ancillary and laboratory) are listed below for reference.    Significant Diagnostic Studies: Dg Chest 1 View  05/29/2015   CLINICAL DATA:  Post LEFT thoracentesis, metastatic breast cancer  EXAM: CHEST  1 VIEW  COMPARISON:  Repeat exam 1633 hours compared to 1537 hours  FINDINGS: RIGHT jugular Port-A-Cath stable tip projecting over SVC.  Decrease in LEFT pleural effusion post thoracentesis.  No pneumothorax.  Persistent atelectasis infiltrate at LEFT base.  Large RIGHT basilar metastasis and scattered interstitial infiltrates in the mid lower RIGHT lung unchanged.  Thoracolumbar scoliosis and slight osseous demineralization.  IMPRESSION: No pneumothorax following LEFT thoracentesis.   Electronically Signed   By: Lavonia Dana M.D.   On: 05/29/2015 16:52   Dg Chest 1  View  05/22/2015   CLINICAL DATA:  Post left-sided thoracentesis. History of breast cancer  EXAM: CHEST  1 VIEW  COMPARISON:  CT the chest, abdomen and pelvis - earlier same day  FINDINGS: Grossly unchanged cardiac silhouette and mediastinal contours with partial obscuration of the right heart border secondary to known dominant metastasis within the anterior aspect the right upper lobe. Stable position of support apparatus. Interval reduction in persistent small partially loculated sided effusion post thoracentesis. No pneumothorax. There is prominence of the left pulmonary hila compatible with the known left-sided anterior mediastinal adenopathy. Surgical clips overlie the left axilla the superior aspect the left mid hemi abdomen. No acute osseus abnormalities.  IMPRESSION: 1. Interval reduction in persistent small loculated left-sided effusion post thoracentesis. No pneumothorax. 2. Findings compatible with extensive metastatic disease to the chest. Please refer to contrast-enhanced chest CT performed earlier same day.   Electronically Signed  By: Sandi Mariscal M.D.   On: 05/22/2015 16:59   Ct Soft Tissue Neck W Contrast  05/22/2015   CLINICAL DATA:  49 year old female with metastatic breast cancer. Palpable abnormality on the left neck. Subsequent encounter.  EXAM: CT NECK WITH CONTRAST  TECHNIQUE: Multidetector CT imaging of the neck was performed using the standard protocol following the bolus administration of intravenous contrast.  CONTRAST:  136mL OMNIPAQUE IOHEXOL 300 MG/ML SOLN in conjunction with contrast enhanced imaging of the chest, abdomen, and pelvis reported separately.  COMPARISON:  PET-CT 12/08/2014. Head CT without contrast 11/08/2012.  FINDINGS: Pharynx and larynx: No laryngeal or pharyngeal mass. Negative parapharyngeal spaces. Negative retropharyngeal space. Negative sublingual space.  Salivary glands: Submandibular and parotid glands are within normal limits.  Thyroid: Negative.  Lymph  nodes:  Palpable area of concern marked along the anterior base of the left neck. Underlying large 34 x 35 x 30 mm (AP by transverse by CC) heterogeneously enhancing soft tissue mass most resembles lymph nodal metastasis (left level 4) with extracapsular extension of disease. This nodal mass abuts the right subclavian neurovascular bundle. The nearby a left IJ and subclavian vein confluence appears to remain patent, but otherwise there is little left subclavian vein intravascular contrast.  There is a small calcified spiculated soft tissue nodule located just posterior to the left thyroid lobe which probably reflects previously treated nodal disease (series 7, image 59).  Other cervical lymph node stations are within normal limits.  However, there is abundant abnormal superior mediastinal and right peritracheal lymphadenopathy, see chest CT reported separately.  Vascular: Right side porta cath in place. Internal jugular veins are patent. Major arterial structures in the neck and at the skullbase are patent.  Limited intracranial: Negative.  Visualized orbits: Negative.  Mastoids and visualized paranasal sinuses: Visualized paranasal sinuses and mastoids are clear.  Skeleton: No acute or suspicious osseous lesion in the neck.  Upper chest: Abnormal, reported separately.  IMPRESSION: 1. Left anterior lower neck palpable abnormality corresponds to left level IV metastatic nodal disease with extra-capsular extension. Conglomerate nodal disease here measures up to 35 mm. 2. No other metastatic disease identified in the neck. 3. Abnormal chest and mediastinum, reported separately.   Electronically Signed   By: Genevie Ann M.D.   On: 05/22/2015 15:28   Ct Chest W Contrast  05/22/2015   CLINICAL DATA:  Subsequent encounter for left breast cancer status post bilateral mastectomy with liver mets.  EXAM: CT CHEST, ABDOMEN, AND PELVIS WITH CONTRAST  TECHNIQUE: Multidetector CT imaging of the chest, abdomen and pelvis was  performed following the standard protocol during bolus administration of intravenous contrast.  CONTRAST:  15mL OMNIPAQUE IOHEXOL 300 MG/ML  SOLN  COMPARISON:  Comparison is made to a report for a CT scan at [facility] dated 03/23/2015.  FINDINGS: CT CHEST FINDINGS  Mediastinum/Nodes: Tip of right-sided Port-A-Cath is in the right atrium. Patient is status post bilateral mastectomy and reconstruction.  Substantial disease progression noted in the chest since 12/28/ 2015. 3.4 x 2.6 cm left supraclavicular lymph node not mentioned previously. 2.6 x 2.5 cm lymph node in the superior left mediastinum mentioned on the previous report presumably represents the 3.8 x 4.3 cm necrotic lymph node seen in the prevascular space today. 2.6 cm necrotic high right paratracheal lymph node not described previously.  5.6 x 8.2 cm necrotic pleural-based lesion in the anterior upper left hemi thorax is apparently new in the interval. Small left pleural effusion described previously with moderate to large  left pleural effusion evident today. Multiple left-sided pleural nodules are evident. Disease in the left hemi thorax displaces cardiomediastinal structures to the right.  Lungs/Pleura: 3.3 cm right middle lobe lung mass is contiguous with the anterior pleura. Fine detail of the lungs is obscured by breathing motion. Multiple bilateral pulmonary nodules measuring in the 5-10 mm size range are evident. There is left upper and lower lobe collapse/ consolidation.  Musculoskeletal: Bone windows reveal no worrisome lytic or sclerotic osseous lesions.  CT ABDOMEN AND PELVIS FINDINGS  Hepatobiliary: Heterogeneously enhancing mass in the dome of the liver measures 6.9 x 7.6 cm. Previous study reported a hepatic dome lesion measuring 6.5 x 5.9 cm. 2.7 cm lesion is identified in the caudate lobe. Gallbladder is decompressed. No intrahepatic or extrahepatic biliary dilation.  Pancreas: 3.2 x 4.3 cm mass in the head of the pancreas results  and mild distention of the main pancreatic duct. A pancreatic mass was described in the previous report, measuring 3.3 x 2.9 cm at that time.  Spleen: No splenomegaly. No focal mass lesion.  Adrenals/Urinary Tract: No adrenal nodule or mass. Tiny cyst noted in the left kidney. No hydronephrosis. No hydroureter. Urinary bladder is unremarkable.  Stomach/Bowel: Stomach is nondistended. No gastric wall thickening. No evidence of outlet obstruction. Duodenum is normally positioned as is the ligament of Treitz. No small bowel wall thickening. No small bowel dilatation. Terminal ileum normal. Appendix normal. No gross colonic mass. No colonic wall thickening. No substantial diverticular change.  Vascular/Lymphatic: No abdominal aortic aneurysm. 2.8 cm necrotic portal caval lymph node is identified. Portal vein is markedly attenuated as it passes between the pancreatic head lesion and the portal caval lymph node, but the portal vein does appear to remain patent. Splenic vein and superior mesenteric vein are patent. No para-aortic lymphadenopathy. No pelvic sidewall lymphadenopathy.  Reproductive: Uterus is surgically absent.  No adnexal mass.  Other: No intraperitoneal free fluid.  Musculoskeletal: Bone windows reveal no worrisome lytic or sclerotic osseous lesions.  IMPRESSION: Comparing to the report of a study from 03/23/2015, there has been marked progression of metastatic disease in the chest and abdomen. This involves left supraclavicular, mediastinal, pleural , lung parenchymal, liver, pancreatic, and hepatoduodenal ligament nodal metastases.  Moderate to large left pleural effusion is associated with left upper and lower lobe collapse/ consolidation.  Compression of the portal vein between the pancreatic head mass and the hepatoduodenal ligament lymphadenopathy.   Electronically Signed   By: Misty Stanley M.D.   On: 05/22/2015 15:40   Ct Angio Chest Pe W/cm &/or Wo Cm  05/29/2015   CLINICAL DATA:  Worsening  shortness of breath for 1.5 weeks. Now with chest pain on breathing.  EXAM: CT ANGIOGRAPHY CHEST WITH CONTRAST  TECHNIQUE: Multidetector CT imaging of the chest was performed using the standard protocol during bolus administration of intravenous contrast. Multiplanar CT image reconstructions and MIPs were obtained to evaluate the vascular anatomy.  CONTRAST:  54mL OMNIPAQUE IOHEXOL 350 MG/ML SOLN  COMPARISON:  05/22/2015  FINDINGS: Technically adequate study with good opacification of the central and segmental pulmonary arteries. No focal filling defects demonstrated. No evidence of significant pulmonary embolus.  Mild cardiac enlargement. Normal caliber thoracic aorta. No aortic dissection or aneurysm. Great vessel origins are patent. Aberrant right subclavian artery. Calcified lymph nodes in the mediastinum could represent old granulomatous infection or treated tumor. Enlarged lymph node in the left supraclavicular region measuring 3.6 cm. Enlarged right paratracheal lymph node measuring 2.4 cm. These are similar in size  to prior study and consistent with metastasis. Large bilateral pleural masses, measuring 11.8 x 6.1 cm on the left and 3.4 cm on the right. Small bilateral pleural effusions. Basilar atelectasis. 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 Appearances are similar to prior study. Multiple metastases in the liver with heterogeneous parenchymal enhancement pattern corresponding to known portal venous compression. Portal veins are not entirely included within the study for comparison. Bilateral breast implants. No destructive bone lesions are appreciated.  Review of the MIP images confirms the above findings.  IMPRESSION: Diffuse metastatic disease in the chest involving left clavicular and right paratracheal lymph nodes, extensive pleural metastases bilaterally, bilateral pleural effusions, interstitial and nodular parenchymal  metastases, and liver metastases. Appearances are similar to previous study. No evidence of significant pulmonary embolus.   Electronically Signed   By: Lucienne Capers M.D.   On: 05/29/2015 23:17   Ct Abdomen Pelvis W Contrast  05/22/2015   CLINICAL DATA:  Subsequent encounter for left breast cancer status post bilateral mastectomy with liver mets.  EXAM: CT CHEST, ABDOMEN, AND PELVIS WITH CONTRAST  TECHNIQUE: Multidetector CT imaging of the chest, abdomen and pelvis was performed following the standard protocol during bolus administration of intravenous contrast.  CONTRAST:  14mL OMNIPAQUE IOHEXOL 300 MG/ML  SOLN  COMPARISON:  Comparison is made to a report for a CT scan at [facility] dated 03/23/2015.  FINDINGS: CT CHEST FINDINGS  Mediastinum/Nodes: Tip of right-sided Port-A-Cath is in the right atrium. Patient is status post bilateral mastectomy and reconstruction.  Substantial disease progression noted in the chest since 12/28/ 2015. 3.4 x 2.6 cm left supraclavicular lymph node not mentioned previously. 2.6 x 2.5 cm lymph node in the superior left mediastinum mentioned on the previous report presumably represents the 3.8 x 4.3 cm necrotic lymph node seen in the prevascular space today. 2.6 cm necrotic high right paratracheal lymph node not described previously.  5.6 x 8.2 cm necrotic pleural-based lesion in the anterior upper left hemi thorax is apparently new in the interval. Small left pleural effusion described previously with moderate to large left pleural effusion evident today. Multiple left-sided pleural nodules are evident. Disease in the left hemi thorax displaces cardiomediastinal structures to the right.  Lungs/Pleura: 3.3 cm right middle lobe lung mass is contiguous with the anterior pleura. Fine detail of the lungs is obscured by breathing motion. Multiple bilateral pulmonary nodules measuring in the 5-10 mm size range are evident. There is left upper and lower lobe collapse/  consolidation.  Musculoskeletal: Bone windows reveal no worrisome lytic or sclerotic osseous lesions.  CT ABDOMEN AND PELVIS FINDINGS  Hepatobiliary: Heterogeneously enhancing mass in the dome of the liver measures 6.9 x 7.6 cm. Previous study reported a hepatic dome lesion measuring 6.5 x 5.9 cm. 2.7 cm lesion is identified in the caudate lobe. Gallbladder is decompressed. No intrahepatic or extrahepatic biliary dilation.  Pancreas: 3.2 x 4.3 cm mass in the head of the pancreas results and mild distention of the main pancreatic duct. A pancreatic mass was described in the previous report, measuring 3.3 x 2.9 cm at that time.  Spleen: No splenomegaly. No focal mass lesion.  Adrenals/Urinary Tract: No adrenal nodule or mass. Tiny cyst noted in the left kidney. No hydronephrosis. No hydroureter. Urinary bladder is unremarkable.  Stomach/Bowel: Stomach is nondistended. No gastric wall thickening. No evidence of outlet obstruction. Duodenum is normally positioned as is the ligament of Treitz. No small bowel wall thickening. No small bowel  dilatation. Terminal ileum normal. Appendix normal. No gross colonic mass. No colonic wall thickening. No substantial diverticular change.  Vascular/Lymphatic: No abdominal aortic aneurysm. 2.8 cm necrotic portal caval lymph node is identified. Portal vein is markedly attenuated as it passes between the pancreatic head lesion and the portal caval lymph node, but the portal vein does appear to remain patent. Splenic vein and superior mesenteric vein are patent. No para-aortic lymphadenopathy. No pelvic sidewall lymphadenopathy.  Reproductive: Uterus is surgically absent.  No adnexal mass.  Other: No intraperitoneal free fluid.  Musculoskeletal: Bone windows reveal no worrisome lytic or sclerotic osseous lesions.  IMPRESSION: Comparing to the report of a study from 03/23/2015, there has been marked progression of metastatic disease in the chest and abdomen. This involves left  supraclavicular, mediastinal, pleural , lung parenchymal, liver, pancreatic, and hepatoduodenal ligament nodal metastases.  Moderate to large left pleural effusion is associated with left upper and lower lobe collapse/ consolidation.  Compression of the portal vein between the pancreatic head mass and the hepatoduodenal ligament lymphadenopathy.   Electronically Signed   By: Misty Stanley M.D.   On: 05/22/2015 15:40   Dg Chest Port 1 View  05/29/2015   CLINICAL DATA:  Shortness of breath. Chest pain for the past week, worsening. Recent thoracentesis. Ongoing chemotherapy for breast cancer.  EXAM: PORTABLE CHEST - 1 VIEW  COMPARISON:  05/22/2015  FINDINGS: Power injectable right internal jugular Port-A-Cath tip: Cavoatrial junction.  Right middle lobe lung mass. Indistinct pulmonary vasculature. Abnormal bilateral interstitial accentuation. Layering large left pleural effusion, mostly lateral. Indistinct left cardiac border. No pneumothorax.  IMPRESSION: 1. Large left pleural effusion with known large pleural mass on the left. 2. Right middle lobe lung mass. 3. Interstitial pulmonary edema.   Electronically Signed   By: Van Clines M.D.   On: 05/29/2015 15:48   US Thoracentesis Asp Pleural Space W/img Guide  05/29/2015   INDICATION: Symptomatic recurrent left sided pleural effusion  EXAM: US THORACENTESIS ASP PLEURAL SPACE W/IMG GUIDE  COMPARISON:  CXR 05/29/15.  MEDICATIONS: None  COMPLICATIONS: None immediate  TECHNIQUE: Informed written consent was obtained from the patient after a discussion of the risks, benefits and alternatives to treatment. A timeout was performed prior to the initiation of the procedure.  Initial ultrasound scanning demonstrates a left pleural effusion. The lower chest was prepped and draped in the usual sterile fashion. 1% lidocaine was used for local anesthesia.  Under direct ultrasound guidance, a 19 gauge, 7-cm, Yueh catheter was introduced. An ultrasound image was saved for  documentation purposes. The thoracentesis was performed. The catheter was removed and a dressing was applied. The patient tolerated the procedure well without immediate post procedural complication. The patient was escorted to have an upright chest radiograph.  FINDINGS: A total of approximately 1.2 liters of serous/amber fluid was removed.  IMPRESSION: Successful ultrasound-guided left sided thoracentesis yielding 1.2 liters of pleural fluid.  Read By:  Tsosie Billing PA-C   Electronically Signed   By: Corrie Mckusick D.O.   On: 05/29/2015 16:38   US Thoracentesis Asp Pleural Space W/img Guide  05/22/2015   INDICATION: Breast cancer, dyspnea, left pleural effusion. Request is made for therapeutic left thoracentesis.  EXAM: ULTRASOUND GUIDED THERAPEUTIC LEFT THORACENTESIS  COMPARISON:  None.  MEDICATIONS: None  COMPLICATIONS: None immediate  TECHNIQUE: Informed written consent was obtained from the patient after a discussion of the risks, benefits and alternatives to treatment. A timeout was performed prior to the initiation of the procedure.  Initial ultrasound  scanning demonstrates a moderate to large left pleural effusion. The lower chest was prepped and draped in the usual sterile fashion. 1% lidocaine was used for local anesthesia.  An ultrasound image was saved for documentation purposes. A 6 Fr Safe-T-Centesis catheter was introduced. The thoracentesis was performed. The catheter was removed and a dressing was applied. The patient did experience a mild vasovagal reaction post procedure which responded well to supine positioning and cool washcloth to the forehead. Patient was escorted to have an upright chest radiograph.  FINDINGS: A total of approximately 1.5 liters of amber/blood-tinged fluid was removed.  IMPRESSION: Successful ultrasound-guided therapeutic left sided thoracentesis yielding 1.5 liters of pleural fluid. Patient with mild vasovagal response post procedure which responded well to supine  positioning and cool compress to the forehead. Vital signs were stable upon discharge.  Read by: Rowe Robert, PA-C   Electronically Signed   By: Aletta Edouard M.D.   On: 05/22/2015 17:29    Microbiology: No results found for this or any previous visit (from the past 240 hour(s)).   Labs: Basic Metabolic Panel:  Recent Labs Lab 05/25/15 1350 05/29/15 1521 05/30/15 0525  NA 138 138 138  K 3.3* 3.8 3.7  CL  --  105 104  CO2 25 27 27   GLUCOSE 143* 128* 86  BUN 11.2 17 16   CREATININE 0.8 0.82 0.68  CALCIUM 8.5 8.2* 8.2*  MG  --   --  1.8  PHOS  --   --  2.9   Liver Function Tests:  Recent Labs Lab 05/25/15 1350 05/30/15 0525  AST 46* 193*  ALT 11 76*  ALKPHOS 82 88  BILITOT 0.83 1.0  PROT 5.4* 4.8*  ALBUMIN 2.5* 2.4*   No results for input(s): LIPASE, AMYLASE in the last 168 hours. No results for input(s): AMMONIA in the last 168 hours. CBC:  Recent Labs Lab 05/25/15 1350 05/29/15 1519 05/30/15 0525  WBC 7.3 15.6* 9.8  NEUTROABS 5.7 14.7* 8.7*  HGB 12.3 11.3* 10.7*  HCT 37.8 36.2 33.8*  MCV 91.3 95.5 93.9  PLT 306 224 180   Cardiac Enzymes: No results for input(s): CKTOTAL, CKMB, CKMBINDEX, TROPONINI in the last 168 hours. BNP: BNP (last 3 results)  Recent Labs  05/29/15 1521  BNP 29.0    ProBNP (last 3 results) No results for input(s): PROBNP in the last 8760 hours.  CBG: No results for input(s): GLUCAP in the last 168 hours.     SignedIrine Seal MD [facility] 05/30/2015, 12:01 PM

## 2015-05-31 LAB — URINE CULTURE: Culture: NO GROWTH

## 2015-06-01 ENCOUNTER — Other Ambulatory Visit: Payer: 59

## 2015-06-01 ENCOUNTER — Ambulatory Visit: Payer: 59

## 2015-06-02 ENCOUNTER — Ambulatory Visit (HOSPITAL_BASED_OUTPATIENT_CLINIC_OR_DEPARTMENT_OTHER): Payer: 59

## 2015-06-02 ENCOUNTER — Other Ambulatory Visit: Payer: Self-pay

## 2015-06-02 ENCOUNTER — Other Ambulatory Visit: Payer: 59

## 2015-06-02 ENCOUNTER — Telehealth: Payer: Self-pay | Admitting: Oncology

## 2015-06-02 ENCOUNTER — Ambulatory Visit (HOSPITAL_BASED_OUTPATIENT_CLINIC_OR_DEPARTMENT_OTHER): Payer: 59 | Admitting: Oncology

## 2015-06-02 ENCOUNTER — Other Ambulatory Visit (HOSPITAL_BASED_OUTPATIENT_CLINIC_OR_DEPARTMENT_OTHER): Payer: 59

## 2015-06-02 VITALS — BP 105/76 | HR 113 | Temp 98.2°F | Resp 19 | Ht 63.0 in | Wt 176.5 lb

## 2015-06-02 DIAGNOSIS — J90 Pleural effusion, not elsewhere classified: Secondary | ICD-10-CM

## 2015-06-02 DIAGNOSIS — C770 Secondary and unspecified malignant neoplasm of lymph nodes of head, face and neck: Secondary | ICD-10-CM | POA: Diagnosis not present

## 2015-06-02 DIAGNOSIS — Z5111 Encounter for antineoplastic chemotherapy: Secondary | ICD-10-CM

## 2015-06-02 DIAGNOSIS — Z5189 Encounter for other specified aftercare: Secondary | ICD-10-CM | POA: Diagnosis not present

## 2015-06-02 DIAGNOSIS — C50312 Malignant neoplasm of lower-inner quadrant of left female breast: Secondary | ICD-10-CM

## 2015-06-02 DIAGNOSIS — J948 Other specified pleural conditions: Secondary | ICD-10-CM

## 2015-06-02 DIAGNOSIS — C50919 Malignant neoplasm of unspecified site of unspecified female breast: Secondary | ICD-10-CM

## 2015-06-02 LAB — COMPREHENSIVE METABOLIC PANEL (CC13)
ALT: 196 U/L — ABNORMAL HIGH (ref 0–55)
AST: 318 U/L (ref 5–34)
Albumin: 2.2 g/dL — ABNORMAL LOW (ref 3.5–5.0)
Alkaline Phosphatase: 198 U/L — ABNORMAL HIGH (ref 40–150)
Anion Gap: 7 mEq/L (ref 3–11)
BILIRUBIN TOTAL: 0.99 mg/dL (ref 0.20–1.20)
BUN: 11.2 mg/dL (ref 7.0–26.0)
CALCIUM: 8.5 mg/dL (ref 8.4–10.4)
CO2: 29 mEq/L (ref 22–29)
CREATININE: 0.7 mg/dL (ref 0.6–1.1)
Chloride: 105 mEq/L (ref 98–109)
GLUCOSE: 106 mg/dL (ref 70–140)
Potassium: 3.8 mEq/L (ref 3.5–5.1)
Sodium: 142 mEq/L (ref 136–145)
Total Protein: 5.1 g/dL — ABNORMAL LOW (ref 6.4–8.3)

## 2015-06-02 LAB — CBC WITH DIFFERENTIAL/PLATELET
BASO%: 0.1 % (ref 0.0–2.0)
Basophils Absolute: 0 10*3/uL (ref 0.0–0.1)
EOS%: 2.9 % (ref 0.0–7.0)
Eosinophils Absolute: 0.1 10*3/uL (ref 0.0–0.5)
HCT: 35.6 % (ref 34.8–46.6)
HGB: 11.5 g/dL — ABNORMAL LOW (ref 11.6–15.9)
LYMPH#: 1 10*3/uL (ref 0.9–3.3)
LYMPH%: 32.5 % (ref 14.0–49.7)
MCH: 29.8 pg (ref 25.1–34.0)
MCHC: 32.2 g/dL (ref 31.5–36.0)
MCV: 92.4 fL (ref 79.5–101.0)
MONO#: 0.1 10*3/uL (ref 0.1–0.9)
MONO%: 4.8 % (ref 0.0–14.0)
NEUT#: 1.9 10*3/uL (ref 1.5–6.5)
NEUT%: 59.7 % (ref 38.4–76.8)
Platelets: 106 10*3/uL — ABNORMAL LOW (ref 145–400)
RBC: 3.86 10*6/uL (ref 3.70–5.45)
RDW: 23.2 % — AB (ref 11.2–14.5)
WBC: 3.1 10*3/uL — AB (ref 3.9–10.3)

## 2015-06-02 MED ORDER — SODIUM CHLORIDE 0.9 % IJ SOLN
10.0000 mL | INTRAMUSCULAR | Status: DC | PRN
  Administered 2015-06-02: 10 mL
  Filled 2015-06-02: qty 10

## 2015-06-02 MED ORDER — SODIUM CHLORIDE 0.9 % IV SOLN
Freq: Once | INTRAVENOUS | Status: AC
  Administered 2015-06-02: 10:00:00 via INTRAVENOUS

## 2015-06-02 MED ORDER — HEPARIN SOD (PORK) LOCK FLUSH 100 UNIT/ML IV SOLN
500.0000 [IU] | Freq: Once | INTRAVENOUS | Status: AC | PRN
  Administered 2015-06-02: 500 [IU]
  Filled 2015-06-02: qty 5

## 2015-06-02 MED ORDER — SODIUM CHLORIDE 0.9 % IV SOLN
270.0000 mg | Freq: Once | INTRAVENOUS | Status: AC
  Administered 2015-06-02: 270 mg via INTRAVENOUS
  Filled 2015-06-02: qty 27

## 2015-06-02 MED ORDER — PEGFILGRASTIM 6 MG/0.6ML ~~LOC~~ PSKT
6.0000 mg | PREFILLED_SYRINGE | Freq: Once | SUBCUTANEOUS | Status: AC
  Administered 2015-06-02: 6 mg via SUBCUTANEOUS
  Filled 2015-06-02: qty 0.6

## 2015-06-02 MED ORDER — SODIUM CHLORIDE 0.9 % IV SOLN
Freq: Once | INTRAVENOUS | Status: AC
  Administered 2015-06-02: 10:00:00 via INTRAVENOUS
  Filled 2015-06-02: qty 4

## 2015-06-02 MED ORDER — SODIUM CHLORIDE 0.9 % IV SOLN
800.0000 mg/m2 | Freq: Once | INTRAVENOUS | Status: AC
  Administered 2015-06-02: 1520 mg via INTRAVENOUS
  Filled 2015-06-02: qty 39.98

## 2015-06-02 NOTE — Progress Notes (Signed)
Verbal consent given today per [name] to treat pt today with abnormal labs.

## 2015-06-02 NOTE — Patient Instructions (Signed)
[facility] Discharge Instructions for Patients Receiving Chemotherapy  Today you received the following chemotherapy agents:  Gemzar and Carboplatin.  To help prevent nausea and vomiting after your treatment, we encourage you to take your nausea medication as prescribed.   If you develop nausea and vomiting that is not controlled by your nausea medication, call the clinic.   BELOW ARE SYMPTOMS THAT SHOULD BE REPORTED IMMEDIATELY:  *FEVER GREATER THAN 100.5 F  *CHILLS WITH OR WITHOUT FEVER  NAUSEA AND VOMITING THAT IS NOT CONTROLLED WITH YOUR NAUSEA MEDICATION  *UNUSUAL SHORTNESS OF BREATH  *UNUSUAL BRUISING OR BLEEDING  TENDERNESS IN MOUTH AND THROAT WITH OR WITHOUT PRESENCE OF ULCERS  *URINARY PROBLEMS  *BOWEL PROBLEMS  UNUSUAL RASH Items with * indicate a potential emergency and should be followed up as soon as possible.  Feel free to call the clinic you have any questions or concerns. The clinic phone number is [phone].  Please show the CHEMO ALERT CARD at check-in to the Emergency Department and triage nurse.   

## 2015-06-02 NOTE — Telephone Encounter (Signed)
Added appts per pof...the patient will get sched in chemo °

## 2015-06-02 NOTE — Progress Notes (Signed)
ID: Kristin Hoffman OB: [birth year]  MR#: [id number]  PPJ#:[id number]  PCP: Kristin Leach, MD GYN:  Kristin Hoffman SU: Kristin Hoffman OTHER MD: Kristin Hoffman, Kristin Hoffman  CHIEF COMPLAINT:  Stage IV breast cancer  CURRENT TREATMENT: carboplatin/ gemcitabine  BREAST CANCER HISTORY: This patient was previously followed by Kristin Hoffman, and was transferred to Kristin Hoffman service as of 08/20/2013.  At the age of 49, the patient had a screening mammogram as a baseline. She had recently given birth and was on birth control pills at that time. The mammogram in November 2006 showed an area of architectural distortion in the left lower inner quadrant. Subsequently an ultrasound was obtained of the left breast showing a 2.0 x 1.5 x 1.4 cm mass, at the 8:00 position, 4 cm from the nipple. A core biopsy performed on 10/21/2005 showed an invasive mammary carcinoma, ER +70%, PR +41%, HER-2/neu negative, with proliferation marker of 38%. [phone])  Breast MRI on 11/08/2005 confirmed a 2.5 cm spiculated mass in the left lower inner quadrant with 3 small satellite nodules adjacent to the primary mass: 3.5 cm posterior medial to the primary mass, measuring 9 mm; 1.5 cm anterior medial to the primary mass measuring 5 mm; and 2 cm medial to the primary mass measuring 5 mm. No axillary adenopathy was noted. No suspicious masses or enhancement are noted in the right breast.  Kristin Hoffman underwentt left lumpectomy under the care of Kristin Hoffman on 11/18/2005 for a 2.5 cm grade 3 invasive ductal carcinoma, ER +70%, PR +41%, HER-2/neu negative, with proliferation marker of 38%. 2 of 23 lymph nodes were involved.  Margins were clear.  She received adjuvant chemotherapy consisting of 6 q. three-week doses of docetaxel/doxorubicin/cyclophosphamide given between January 2007 and may of 2007, with last dose on 04/20/2006. She underwent radiation therapy completed 07/11/2006, after which she began on tamoxifen in early August  2007.  She underwent hysterectomy with bilateral salpingo-oophorectomy on 07/15/2008, and was started on letrozole in October of 2009.  A mammogram 11/10/2009 showed microcalcifications in the left breast, and a subsequent biopsy on 11/18/2009 confirmed invasive mammary carcinoma in the left breast. PET and CT of the chest showed no evidence of metastasis in January 2011.  She underwent bilateral mastectomies 01/25/2010, with the right breast showing no evidence of malignancy; in the left breast showing a 1.0 cm grade 2 invasive ductal carcinoma with high-grade DCIS. Tumor was ER +22%, PR negative, HER-2/neu negative, with proliferation marker of 13%. Margins were clear. Patient underwent concurrent left latissimus flap reconstruction and right implant reconstruction.  She received additional chemotherapy with one cycle of carboplatin/gemcitabine given on 03/11/2010. She then received 5 q. three-week cycles of IV CMF between 03/25/2010 and 06/17/2010.  She was started on exemestane in January 2012 and continued until late November 2013 when she was hospitalized for apparent TIA.   Her subsequent history is as detailed below  INTERVAL HISTORY: Kristin Hoffman returns today for follow up of her metastatic breast cancer accompanied by her mother. Today is day 8 cycle 1 of 3 cycles planned of carboplatin and Gemzar given on days one and 8 of each cycle. She receives Neupogen on days 2 and 3 and 4 of each cycle and then onpro on day 9.   REVIEW OF SYSTEMS: Presented to the ED last Friday, 05/29/2015, with worsening shortness of breath. A CT Angie was obtained which showed no pulmonary embolus. Her left effusion however had recurred. This was drained again. She then proceeded to chemotherapy the  week ago. She tolerated that well, with no problems with nausea or vomiting. She doesn't think she was any more tired after the chemotherapy than before. Her maximum temperature was 100. She received Neupogen 3 without any  bony aches or pains. She never developed mouth sores. She doesn't have any peripheral neuropathy symptoms. She is taking approximately 2 Percocet a day, which is not constipating her. She does have pain chiefly in her back bilaterally. She is using oxygen pretty much 24 7. She has a minimal cough, a little bit of pleurisy and she takes a deep breath, no hemoptysis. A detailed review of systems today was otherwise stable  PAST MEDICaL HISTORY: Past Medical History  Diagnosis Date  . Depression   . Reflux   . Hx-TIA (transient ischemic attack) 11/22/2013  . Chronic headaches 11/22/2013  . Anxiety 11/22/2013  . Breast cancer   . Cancer   . Breast cancer metastasized to multiple sites 12/24/2013    PAST SURGICAL HISTORY: Past Surgical History  Procedure Laterality Date  . Mastectomy w/ nodes partial    . Mastectomy    . Breast reconstruction    . Portacath placement      x 2  . Portacath removal      x 2  . Abdominal hysterectomy    . Tee without cardioversion  11/12/2012    Procedure: TRANSESOPHAGEAL ECHOCARDIOGRAM (TEE);  Surgeon: Kristin Furbish, MD;  [facility]: [facility] ENDOSCOPY;  Service: Cardiovascular;  Laterality: N/A;  This TEE may be Dr. Marlou Hoffman or a LHC Dr    FAMILY HISTORY Both parents are alive and well. Patient has one sister who is 8 years younger and is in good health. No other history of breast or ovarian cancer in the family. Family History  Problem Relation Age of Onset  . Diabetes Mellitus II Neg Hx   . Hypertension Neg Hx     GYNECOLOGIC HISTORY:   (Updated January 2015) G2P2, menarche at age 89 with irregular menses. On birth control pills in the past. On Clomid to induce ovulation with first pregnancy. Also had preeclampsia with first pregnancy. Status post hysterectomy and bilateral salpingo-oophorectomy in August 2009.  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 She is originally from  [place]. She's been married to Kristin Hoffman, for 13 years. He works as an Pharmacist, hospital at [employer].   ADVANCED DIRECTIVES:  Not in place; Not in place. At the clinic visit 06/02/2015 the patient was given the appropriate forms to complete and notarize at her discretion.    HEALTH MAINTENANCE:  (Updated 11/22/2013) History  Substance Use Topics  . Smoking status: Never Smoker   . Smokeless tobacco: Never Used  . Alcohol Use: Yes     Comment: rarely     Colonoscopy:  Never  PAP: S/P LAVH/BSO in August 2009  Bone density: Never  Lipid panel: Not on file   Allergies  Allergen Reactions  . Afinitor [Everolimus] Hives and Itching    Current Outpatient Prescriptions  Medication Sig Dispense Refill  . acidophilus (RISAQUAD) CAPS capsule Take 1 capsule by mouth daily.    Marland Kitchen aspirin EC 81 MG tablet Take 81 mg by mouth daily with breakfast.    . Biotin 5000 MCG CAPS Take 1 capsule (5,000 mcg total) by mouth daily. 30 capsule   . cetirizine (ZYRTEC) 10 MG tablet Take 10 mg by mouth at bedtime.     Marland Kitchen  Cholecalciferol 2000 UNITS CHEW Chew 1 tablet by mouth daily with breakfast.     . docusate sodium (COLACE) 100 MG capsule Take 1 capsule (100 mg total) by mouth 2 (two) times daily. 20 capsule 0  . escitalopram (LEXAPRO) 20 MG tablet Take 20 mg by mouth daily with breakfast.     . Ibuprofen (ADVIL) 200 MG CAPS Take 2 capsules (400 mg total) by mouth 3 (three) times daily with meals as needed. (Patient taking differently: Take 400 mg by mouth every 6 (six) hours as needed (for pain). ) 120 each 0  . LORazepam (ATIVAN) 0.5 MG tablet TAKE 1 TABLET BY MOUTH AS NEEDED FOR NAUSEA AND VOMITING (Patient taking differently: TAKE 1 TABLET BY MOUTH every night at bedtime) 30 tablet 0  . Melatonin 10 MG TABS Take 1 tablet by mouth at bedtime.    . montelukast (SINGULAIR) 10 MG tablet Take 10 mg by mouth daily with breakfast.     . Multiple Vitamin (MULTIVITAMIN) tablet Take 1 tablet by mouth  daily.    Marland Kitchen nystatin (MYCOSTATIN) 100000 UNIT/ML suspension Take 5 mLs (500,000 Units total) by mouth 4 (four) times daily. (Patient taking differently: Take 5 mLs by mouth daily as needed (for mouth sores). ) 240 mL 1  . omeprazole (PRILOSEC) 40 MG capsule Take 40 mg by mouth daily.     . ondansetron (ZOFRAN) 8 MG tablet Take 8 mg by mouth 2 (two) times daily. Starts day after chemo    . oxyCODONE-acetaminophen (PERCOCET/ROXICET) 5-325 MG per tablet Take 1 tablet by mouth every 4 (four) hours as needed for moderate pain.   0  . polyethylene glycol (MIRALAX / GLYCOLAX) packet Take 17 g by mouth daily. 14 each 0  . PRESCRIPTION MEDICATION Chemo - [facility]    . prochlorperazine (COMPAZINE) 10 MG tablet Take 10 mg by mouth every 6 (six) hours as needed for nausea or vomiting.     . traMADol (ULTRAM) 50 MG tablet Take 1 tablet (50 mg total) by mouth every 6 (six) hours as needed. (Patient taking differently: Take 50 mg by mouth every 6 (six) hours as needed for moderate pain. ) 30 tablet 3   No current facility-administered medications for this visit.    OBJECTIVE: Young white woman in no acute distress Filed Vitals:   06/02/15 0839  BP: 105/76  Pulse: 113  Temp: 98.2 F (36.8 C)  Resp: 19  Body mass index is 31.27 kg/(m^2).  ECOG: 2 Filed Weights   06/02/15 0839  Weight: 176 lb 8 oz (80.06 kg)    Sclerae unicteric, pupils round and equal Oropharynx clear and moist-- no thrush or other lesions No cervical or supraclavicular adenopathy Lungs no rales or rhonchi, or dullness at both bases. Heart regular rate and rhythm Abd soft, nontender, positive bowel sounds MSK no focal spinal tenderness, no upper extremity lymphedema Neuro: nonfocal, well oriented, appropriate affect Breasts: Deferred      LAB RESULTS:   Lab Results  Component Value Date   WBC 3.1* 06/02/2015   NEUTROABS 1.9 06/02/2015   HGB 11.5* 06/02/2015   HCT 35.6 06/02/2015   MCV 92.4 06/02/2015   PLT 106*  06/02/2015      Chemistry      Component Value Date/Time   NA 138 05/30/2015 0525   NA 138 05/25/2015 1350   K 3.7 05/30/2015 0525   K 3.3* 05/25/2015 1350   CL 104 05/30/2015 0525   CL 104 11/14/2012 1407   CO2 27 05/30/2015  0525   CO2 25 05/25/2015 1350   BUN 16 05/30/2015 0525   BUN 11.2 05/25/2015 1350   CREATININE 0.68 05/30/2015 0525   CREATININE 0.8 05/25/2015 1350      Component Value Date/Time   CALCIUM 8.2* 05/30/2015 0525   CALCIUM 8.5 05/25/2015 1350   ALKPHOS 88 05/30/2015 0525   ALKPHOS 82 05/25/2015 1350   AST 193* 05/30/2015 0525   AST 46* 05/25/2015 1350   ALT 76* 05/30/2015 0525   ALT 11 05/25/2015 1350   BILITOT 1.0 05/30/2015 0525   BILITOT 0.83 05/25/2015 1350      STUDIES: Dg Chest 1 View  05/29/2015   CLINICAL DATA:  Post LEFT thoracentesis, metastatic breast cancer  EXAM: CHEST  1 VIEW  COMPARISON:  Repeat exam 1633 hours compared to 1537 hours  FINDINGS: RIGHT jugular Port-A-Cath stable tip projecting over SVC.  Decrease in LEFT pleural effusion post thoracentesis.  No pneumothorax.  Persistent atelectasis infiltrate at LEFT base.  Large RIGHT basilar metastasis and scattered interstitial infiltrates in the mid lower RIGHT lung unchanged.  Thoracolumbar scoliosis and slight osseous demineralization.  IMPRESSION: No pneumothorax following LEFT thoracentesis.   Electronically Signed   By: Lavonia Dana M.D.   On: 05/29/2015 16:52   Dg Chest 1 View  05/22/2015   CLINICAL DATA:  Post left-sided thoracentesis. History of breast cancer  EXAM: CHEST  1 VIEW  COMPARISON:  CT the chest, abdomen and pelvis - earlier same day  FINDINGS: Grossly unchanged cardiac silhouette and mediastinal contours with partial obscuration of the right heart border secondary to known dominant metastasis within the anterior aspect the right upper lobe. Stable position of support apparatus. Interval reduction in persistent small partially loculated sided effusion post thoracentesis. No  pneumothorax. There is prominence of the left pulmonary hila compatible with the known left-sided anterior mediastinal adenopathy. Surgical clips overlie the left axilla the superior aspect the left mid hemi abdomen. No acute osseus abnormalities.  IMPRESSION: 1. Interval reduction in persistent small loculated left-sided effusion post thoracentesis. No pneumothorax. 2. Findings compatible with extensive metastatic disease to the chest. Please refer to contrast-enhanced chest CT performed earlier same day.   Electronically Signed   By: Sandi Mariscal M.D.   On: 05/22/2015 16:59   Ct Soft Tissue Neck W Contrast  05/22/2015   CLINICAL DATA:  49 year old female with metastatic breast cancer. Palpable abnormality on the left neck. Subsequent encounter.  EXAM: CT NECK WITH CONTRAST  TECHNIQUE: Multidetector CT imaging of the neck was performed using the standard protocol following the bolus administration of intravenous contrast.  CONTRAST:  134m OMNIPAQUE IOHEXOL 300 MG/ML SOLN in conjunction with contrast enhanced imaging of the chest, abdomen, and pelvis reported separately.  COMPARISON:  PET-CT 12/08/2014. Head CT without contrast 11/08/2012.  FINDINGS: Pharynx and larynx: No laryngeal or pharyngeal mass. Negative parapharyngeal spaces. Negative retropharyngeal space. Negative sublingual space.  Salivary glands: Submandibular and parotid glands are within normal limits.  Thyroid: Negative.  Lymph nodes:  Palpable area of concern marked along the anterior base of the left neck. Underlying large 34 x 35 x 30 mm (AP by transverse by CC) heterogeneously enhancing soft tissue mass most resembles lymph nodal metastasis (left level 4) with extracapsular extension of disease. This nodal mass abuts the right subclavian neurovascular bundle. The nearby a left IJ and subclavian vein confluence appears to remain patent, but otherwise there is little left subclavian vein intravascular contrast.  There is a small calcified  spiculated soft tissue nodule located  just posterior to the left thyroid lobe which probably reflects previously treated nodal disease (series 7, image 59).  Other cervical lymph node stations are within normal limits.  However, there is abundant abnormal superior mediastinal and right peritracheal lymphadenopathy, see chest CT reported separately.  Vascular: Right side porta cath in place. Internal jugular veins are patent. Major arterial structures in the neck and at the skullbase are patent.  Limited intracranial: Negative.  Visualized orbits: Negative.  Mastoids and visualized paranasal sinuses: Visualized paranasal sinuses and mastoids are clear.  Skeleton: No acute or suspicious osseous lesion in the neck.  Upper chest: Abnormal, reported separately.  IMPRESSION: 1. Left anterior lower neck palpable abnormality corresponds to left level IV metastatic nodal disease with extra-capsular extension. Conglomerate nodal disease here measures up to 35 mm. 2. No other metastatic disease identified in the neck. 3. Abnormal chest and mediastinum, reported separately.   Electronically Signed   By: Genevie Ann M.D.   On: 05/22/2015 15:28   Ct Chest W Contrast  05/22/2015   CLINICAL DATA:  Subsequent encounter for left breast cancer status post bilateral mastectomy with liver mets.  EXAM: CT CHEST, ABDOMEN, AND PELVIS WITH CONTRAST  TECHNIQUE: Multidetector CT imaging of the chest, abdomen and pelvis was performed following the standard protocol during bolus administration of intravenous contrast.  CONTRAST:  113m OMNIPAQUE IOHEXOL 300 MG/ML  SOLN  COMPARISON:  [facility]  FINDINGS: CT CHEST FINDINGS  Mediastinum/Nodes: Tip of right-sided Port-A-Cath is in the right atrium. Patient is status post bilateral mastectomy and reconstruction.  Substantial disease progression noted in the chest since 12/28/ 2015. 3.4 x 2.6 cm left supraclavicular lymph node not  mentioned previously. 2.6 x 2.5 cm lymph node in the superior left mediastinum mentioned on the previous report presumably represents the 3.8 x 4.3 cm necrotic lymph node seen in the prevascular space today. 2.6 cm necrotic high right paratracheal lymph node not described previously.  5.6 x 8.2 cm necrotic pleural-based lesion in the anterior upper left hemi thorax is apparently new in the interval. Small left pleural effusion described previously with moderate to large left pleural effusion evident today. Multiple left-sided pleural nodules are evident. Disease in the left hemi thorax displaces cardiomediastinal structures to the right.  Lungs/Pleura: 3.3 cm right middle lobe lung mass is contiguous with the anterior pleura. Fine detail of the lungs is obscured by breathing motion. Multiple bilateral pulmonary nodules measuring in the 5-10 mm size range are evident. There is left upper and lower lobe collapse/ consolidation.  Musculoskeletal: Bone windows reveal no worrisome lytic or sclerotic osseous lesions.  CT ABDOMEN AND PELVIS FINDINGS  Hepatobiliary: Heterogeneously enhancing mass in the dome of the liver measures 6.9 x 7.6 cm. Previous study reported a hepatic dome lesion measuring 6.5 x 5.9 cm. 2.7 cm lesion is identified in the caudate lobe. Gallbladder is decompressed. No intrahepatic or extrahepatic biliary dilation.  Pancreas: 3.2 x 4.3 cm mass in the head of the pancreas results and mild distention of the main pancreatic duct. A pancreatic mass was described in the previous report, measuring 3.3 x 2.9 cm at that time.  Spleen: No splenomegaly. No focal mass lesion.  Adrenals/Urinary Tract: No adrenal nodule or mass. Tiny cyst noted in the left kidney. No hydronephrosis. No hydroureter. Urinary bladder is unremarkable.  Stomach/Bowel: Stomach is nondistended. No gastric wall thickening. No evidence of outlet obstruction. Duodenum is normally positioned as is  the ligament of Treitz. No small bowel wall  thickening. No small bowel dilatation. Terminal ileum normal. Appendix normal. No gross colonic mass. No colonic wall thickening. No substantial diverticular change.  Vascular/Lymphatic: No abdominal aortic aneurysm. 2.8 cm necrotic portal caval lymph node is identified. Portal vein is markedly attenuated as it passes between the pancreatic head lesion and the portal caval lymph node, but the portal vein does appear to remain patent. Splenic vein and superior mesenteric vein are patent. No para-aortic lymphadenopathy. No pelvic sidewall lymphadenopathy.  Reproductive: Uterus is surgically absent.  No adnexal mass.  Other: No intraperitoneal free fluid.  Musculoskeletal: Bone windows reveal no worrisome lytic or sclerotic osseous lesions.  IMPRESSION: Comparing to the report of a study from 03/23/2015, there has been marked progression of metastatic disease in the chest and abdomen. This involves left supraclavicular, mediastinal, pleural , lung parenchymal, liver, pancreatic, and hepatoduodenal ligament nodal metastases.  Moderate to large left pleural effusion is associated with left upper and lower lobe collapse/ consolidation.  Compression of the portal vein between the pancreatic head mass and the hepatoduodenal ligament lymphadenopathy.   Electronically Signed   By: Misty Stanley M.D.   On: 05/22/2015 15:40   Ct Angio Chest Pe W/cm &/or Wo Cm  05/29/2015   CLINICAL DATA:  Worsening shortness of breath for 1.5 weeks. Now with chest pain on breathing.  EXAM: CT ANGIOGRAPHY CHEST WITH CONTRAST  TECHNIQUE: Multidetector CT imaging of the chest was performed using the standard protocol during bolus administration of intravenous contrast. Multiplanar CT image reconstructions and MIPs were obtained to evaluate the vascular anatomy.  CONTRAST:  27m OMNIPAQUE IOHEXOL 350 MG/ML SOLN  COMPARISON:  05/22/2015  FINDINGS: Technically adequate study with good opacification of the central and segmental pulmonary arteries.  No focal filling defects demonstrated. No evidence of significant pulmonary embolus.  Mild cardiac enlargement. Normal caliber thoracic aorta. No aortic dissection or aneurysm. Great vessel origins are patent. Aberrant right subclavian artery. Calcified lymph nodes in the mediastinum could represent old granulomatous infection or treated tumor. Enlarged lymph node in the left supraclavicular region measuring 3.6 cm. Enlarged right paratracheal lymph node measuring 2.4 cm. These are similar in size to prior study and consistent with metastasis. Large bilateral pleural masses, measuring 11.8 x 6.1 cm on the left and 3.4 cm on the right. Small bilateral pleural effusions. Basilar atelectasis. 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 Appearances are similar to prior study. Multiple metastases in the liver with heterogeneous parenchymal enhancement pattern corresponding to known portal venous compression. Portal veins are not entirely included within the study for comparison. Bilateral breast implants. No destructive bone lesions are appreciated.  Review of the MIP images confirms the above findings.  IMPRESSION: Diffuse metastatic disease in the chest involving left clavicular and right paratracheal lymph nodes, extensive pleural metastases bilaterally, bilateral pleural effusions, interstitial and nodular parenchymal metastases, and liver metastases. Appearances are similar to previous study. No evidence of significant pulmonary embolus.   Electronically Signed   By: WLucienne CapersM.D.   On: 05/29/2015 23:17   Ct Abdomen Pelvis W Contrast  05/22/2015   CLINICAL DATA:  Subsequent encounter for left breast cancer status post bilateral mastectomy with liver mets.  EXAM: CT CHEST, ABDOMEN, AND PELVIS WITH CONTRAST  TECHNIQUE: Multidetector CT imaging of the chest, abdomen and pelvis was performed following the standard protocol during bolus  administration of intravenous contrast.  CONTRAST:  1035mOMNIPAQUE IOHEXOL 300 MG/ML  SOLN  COMPARISON:  Comparison is made to a report for a CT scan [facility] dated 03/23/2015.  FINDINGS: CT CHEST FINDINGS  Mediastinum/Nodes: Tip of right-sided Port-A-Cath is in the right atrium. Patient is status post bilateral mastectomy and reconstruction.  Substantial disease progression noted in the chest since 12/28/ 2015. 3.4 x 2.6 cm left supraclavicular lymph node not mentioned previously. 2.6 x 2.5 cm lymph node in the superior left mediastinum mentioned on the previous report presumably represents the 3.8 x 4.3 cm necrotic lymph node seen in the prevascular space today. 2.6 cm necrotic high right paratracheal lymph node not described previously.  5.6 x 8.2 cm necrotic pleural-based lesion in the anterior upper left hemi thorax is apparently new in the interval. Small left pleural effusion described previously with moderate to large left pleural effusion evident today. Multiple left-sided pleural nodules are evident. Disease in the left hemi thorax displaces cardiomediastinal structures to the right.  Lungs/Pleura: 3.3 cm right middle lobe lung mass is contiguous with the anterior pleura. Fine detail of the lungs is obscured by breathing motion. Multiple bilateral pulmonary nodules measuring in the 5-10 mm size range are evident. There is left upper and lower lobe collapse/ consolidation.  Musculoskeletal: Bone windows reveal no worrisome lytic or sclerotic osseous lesions.  CT ABDOMEN AND PELVIS FINDINGS  Hepatobiliary: Heterogeneously enhancing mass in the dome of the liver measures 6.9 x 7.6 cm. Previous study reported a hepatic dome lesion measuring 6.5 x 5.9 cm. 2.7 cm lesion is identified in the caudate lobe. Gallbladder is decompressed. No intrahepatic or extrahepatic biliary dilation.  Pancreas: 3.2 x 4.3 cm mass in the head of the pancreas results and mild distention of the main pancreatic duct. A  pancreatic mass was described in the previous report, measuring 3.3 x 2.9 cm at that time.  Spleen: No splenomegaly. No focal mass lesion.  Adrenals/Urinary Tract: No adrenal nodule or mass. Tiny cyst noted in the left kidney. No hydronephrosis. No hydroureter. Urinary bladder is unremarkable.  Stomach/Bowel: Stomach is nondistended. No gastric wall thickening. No evidence of outlet obstruction. Duodenum is normally positioned as is the ligament of Treitz. No small bowel wall thickening. No small bowel dilatation. Terminal ileum normal. Appendix normal. No gross colonic mass. No colonic wall thickening. No substantial diverticular change.  Vascular/Lymphatic: No abdominal aortic aneurysm. 2.8 cm necrotic portal caval lymph node is identified. Portal vein is markedly attenuated as it passes between the pancreatic head lesion and the portal caval lymph node, but the portal vein does appear to remain patent. Splenic vein and superior mesenteric vein are patent. No para-aortic lymphadenopathy. No pelvic sidewall lymphadenopathy.  Reproductive: Uterus is surgically absent.  No adnexal mass.  Other: No intraperitoneal free fluid.  Musculoskeletal: Bone windows reveal no worrisome lytic or sclerotic osseous lesions.  IMPRESSION: Comparing to the report of a study from 03/23/2015, there has been marked progression of metastatic disease in the chest and abdomen. This involves left supraclavicular, mediastinal, pleural , lung parenchymal, liver, pancreatic, and hepatoduodenal ligament nodal metastases.  Moderate to large left pleural effusion is associated with left upper and lower lobe collapse/ consolidation.  Compression of the portal vein between the pancreatic head mass and the hepatoduodenal ligament lymphadenopathy.   Electronically Signed   By: Misty Stanley M.D.   On: 05/22/2015 15:40   Dg Chest Port 1 View  05/29/2015   CLINICAL DATA:  Shortness of breath. Chest pain for the past week, worsening. Recent  thoracentesis. Ongoing chemotherapy for breast  cancer.  EXAM: PORTABLE CHEST - 1 VIEW  COMPARISON:  05/22/2015  FINDINGS: Power injectable right internal jugular Port-A-Cath tip: Cavoatrial junction.  Right middle lobe lung mass. Indistinct pulmonary vasculature. Abnormal bilateral interstitial accentuation. Layering large left pleural effusion, mostly lateral. Indistinct left cardiac border. No pneumothorax.  IMPRESSION: 1. Large left pleural effusion with known large pleural mass on the left. 2. Right middle lobe lung mass. 3. Interstitial pulmonary edema.   Electronically Signed   By: Van Clines M.D.   On: 05/29/2015 15:48   US Thoracentesis Asp Pleural Space W/img Guide  05/29/2015   INDICATION: Symptomatic recurrent left sided pleural effusion  EXAM: US THORACENTESIS ASP PLEURAL SPACE W/IMG GUIDE  COMPARISON:  CXR 05/29/15.  MEDICATIONS: None  COMPLICATIONS: None immediate  TECHNIQUE: Informed written consent was obtained from the patient after a discussion of the risks, benefits and alternatives to treatment. A timeout was performed prior to the initiation of the procedure.  Initial ultrasound scanning demonstrates a left pleural effusion. The lower chest was prepped and draped in the usual sterile fashion. 1% lidocaine was used for local anesthesia.  Under direct ultrasound guidance, a 19 gauge, 7-cm, Yueh catheter was introduced. An ultrasound image was saved for documentation purposes. The thoracentesis was performed. The catheter was removed and a dressing was applied. The patient tolerated the procedure well without immediate post procedural complication. The patient was escorted to have an upright chest radiograph.  FINDINGS: A total of approximately 1.2 liters of serous/amber fluid was removed.  IMPRESSION: Successful ultrasound-guided left sided thoracentesis yielding 1.2 liters of pleural fluid.  Read By:  Tsosie Billing PA-C   Electronically Signed   By: Corrie Mckusick D.O.   On: 05/29/2015  16:38   US Thoracentesis Asp Pleural Space W/img Guide  05/22/2015   INDICATION: Breast cancer, dyspnea, left pleural effusion. Request is made for therapeutic left thoracentesis.  EXAM: ULTRASOUND GUIDED THERAPEUTIC LEFT THORACENTESIS  COMPARISON:  None.  MEDICATIONS: None  COMPLICATIONS: None immediate  TECHNIQUE: Informed written consent was obtained from the patient after a discussion of the risks, benefits and alternatives to treatment. A timeout was performed prior to the initiation of the procedure.  Initial ultrasound scanning demonstrates a moderate to large left pleural effusion. The lower chest was prepped and draped in the usual sterile fashion. 1% lidocaine was used for local anesthesia.  An ultrasound image was saved for documentation purposes. A 6 Fr Safe-T-Centesis catheter was introduced. The thoracentesis was performed. The catheter was removed and a dressing was applied. The patient did experience a mild vasovagal reaction post procedure which responded well to supine positioning and cool washcloth to the forehead. Patient was escorted to have an upright chest radiograph.  FINDINGS: A total of approximately 1.5 liters of amber/blood-tinged fluid was removed.  IMPRESSION: Successful ultrasound-guided therapeutic left sided thoracentesis yielding 1.5 liters of pleural fluid. Patient with mild vasovagal response post procedure which responded well to supine positioning and cool compress to the forehead. Vital signs were stable upon discharge.  Read by: Rowe Robert, PA-C   Electronically Signed   By: Aletta Edouard M.D.   On: 05/22/2015 17:29     ASSESSMENT: 49 y.o. 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 2 of 23 lymph nodes were involved.  Margins were clear.  (2) status post adjuvant chemotherapy consisting  of 6 q.  three-week doses of docetaxel/ doxorubicin/ cyclophosphamide given between January 2007 and may of 2007, with last dose on 04/20/2006.  (3) status post radiation therapy to the left breast, completed 07/11/2006  (4) began on tamoxifen in early August 2007 and continued until October 2009. She status post hysterectomy with bilateral salpingo-oophorectomy on 07/15/2008, and was started on letrozole in October of 2009.  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 PET and CT of the chest showed no evidence of metastasis in January 2011.  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 Margins were clear. Patient underwent concurrent left latissimus flap reconstruction and right implant reconstruction.  (7)  status post additional chemotherapy with one cycle of carboplatin/gemcitabine given on 03/11/2010. She then received 5 q. three-week cycles of IV CMF between 03/25/2010 and 06/17/2010.  (8) started on exemestane in January 2012 and continued until late November 2013 when she was hospitalized for apparent TIA at which time her exemestane was stopped and not resumed  (9) METASTATIC DISEASE: evidence of disease recurence noted on chest CT, liver MRI and PET scan in December 2014, with suspicious lung nodules, lymphadenopathy, and 3 lesions in the right lobe of the liver, but no evidence of bony disease and no evidence of brain involvement by brain MRI September 2014. Biopsy of a left supraclavicular lymph node on 12/11/2013 confirmed metastatic carcinoma, estrogen receptor 45% positive, progesterone receptor 17% positive, with no HER-2 amplification.  (10) fulvestrant started  12/03/2013, discontinued 03/25/2014 with progression  (11) exemestane/ everolimus started 04/04/2014; everolimus held 04/14/2014, with skin rash and mouth soreness; resumed at 5 mg a day as of 04/29/2014, discontinued 05/09/2014 with similar symptoms  (12) continueing exemestane, adding palbociclib 05/26/2014;   (a) palbociclib dose dropped to 75 mg every other day for 21 days beginning with cycle 4  (b) exemestane and Palbociclib discontinued December 2015 with evidence of progression  (13) [facility] referral placed 04/28/2049 -- re-sent 05/23/2014-- shows Pi3KCA and TP53 mutations  (a) [facility] one test requested 12/10/2014-- confirms [employer] results  (b) did not qualify for capecitabine/ BYL 719 trial because of elevated lipase  (14) started eribulin 12/26/2014, 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   (a) dpse dropped from 2g BID to 1.5g BID starting on 04/26/15  (b) stopped 05/17/2015 (after 4 cycles) with progression  (16) to start carboplatin/ gemcitabine  05/26/2015, to be given day one and day 8 of each 21 day cycle  (a) given her history of severe neutropenia with this chemotherapy we'll do Neupogen days 23 and 4 of each cycle and onpro day 9  (17) Left pleural effusion\   (a) s/p L thoracentesis 05/22/2015, 05/29/2015   PLAN:  Diamone did well with her first dose of carboplatin and gemcitabine, and she tolerated the Neupogen support without event. Her counts will allow [place] to give her the day 8 treatment today. She will receive onpro so she does not have to return tomorrow just to receive a shot.  We are trying to get her Neupogen at home so she can give herself  the shots on days 23 and 4 of each cycle, a [place] avoiding unnecessary trips here, which are difficult for her  I have made her return appointment  for next Monday. She will have a chest x-ray that day. If her left effusion has significantly reaccumulated we will consider not only having a drain but placing a Pleurx catheter.. We would then ask advanced home care to go ahead and drain the fluid twice a week.   Of course we will see Jacquelin earlier than next week if the need arises. She is taking all her supportive medicines appropriately. She understands she very likely will lose her hair within the next 10 days.   Chauncey Cruel, MD   06/02/2015 8:57 AM

## 2015-06-03 ENCOUNTER — Ambulatory Visit: Payer: 59

## 2015-06-03 LAB — CULTURE, BLOOD (ROUTINE X 2)
Culture: NO GROWTH
Culture: NO GROWTH

## 2015-06-05 ENCOUNTER — Telehealth: Payer: Self-pay

## 2015-06-05 ENCOUNTER — Ambulatory Visit (HOSPITAL_COMMUNITY)
Admission: RE | Admit: 2015-06-05 | Discharge: 2015-06-05 | Disposition: A | Discharge status: Home / Self Care | Payer: 59 | Source: Ambulatory Visit | Attending: Oncology | Admitting: Oncology

## 2015-06-05 ENCOUNTER — Telehealth: Payer: Self-pay | Admitting: *Deleted

## 2015-06-05 ENCOUNTER — Other Ambulatory Visit: Payer: Self-pay

## 2015-06-05 ENCOUNTER — Ambulatory Visit (HOSPITAL_COMMUNITY)
Admission: RE | Admit: 2015-06-05 | Discharge: 2015-06-05 | Disposition: A | Discharge status: Home / Self Care | Payer: 59 | Source: Ambulatory Visit | Attending: Radiology | Admitting: Radiology

## 2015-06-05 DIAGNOSIS — C50312 Malignant neoplasm of lower-inner quadrant of left female breast: Secondary | ICD-10-CM

## 2015-06-05 DIAGNOSIS — C50919 Malignant neoplasm of unspecified site of unspecified female breast: Secondary | ICD-10-CM

## 2015-06-05 DIAGNOSIS — R918 Other nonspecific abnormal finding of lung field: Secondary | ICD-10-CM | POA: Insufficient documentation

## 2015-06-05 DIAGNOSIS — R0602 Shortness of breath: Secondary | ICD-10-CM

## 2015-06-05 DIAGNOSIS — Z9889 Other specified postprocedural states: Secondary | ICD-10-CM

## 2015-06-05 DIAGNOSIS — J90 Pleural effusion, not elsewhere classified: Secondary | ICD-10-CM | POA: Insufficient documentation

## 2015-06-05 DIAGNOSIS — C50911 Malignant neoplasm of unspecified site of right female breast: Secondary | ICD-10-CM

## 2015-06-05 MED ORDER — LIDOCAINE HCL (PF) 1 % IJ SOLN
INTRAMUSCULAR | Status: AC
  Filled 2015-06-05: qty 10

## 2015-06-05 NOTE — Telephone Encounter (Signed)
VM received @ 8:47 am from pt's husband.  TC back to husband. He states that Kristin Hoffman started becoming more short of breath yesterday and last night, panting and not being able to take deep breath. She had thoracentesis done 1 week ago for 1.5 liters fluid. He thinks that she needs that again.  Spoke with [name] nurse and she will confirm with [name] to schedule  Left thoracentsis today. POF/order placed and call made to central scheduling to schedule today. Waiting to hear back for time. Kristin Hoffman (husband) that i would call back as soon as I knew what time and where procedure will take place.  Received call back from Radiology at [facility]. They can do her thoracentesis @ 2 pm today with 1:45 pm check in time.  TC to husband and let him know of today's appt time. He did ask about pleurx drain insertion as this will be the 3rd time she has had to have thoracentesis. Will discuss with [name].

## 2015-06-05 NOTE — Procedures (Signed)
Successful US guided left thoracentesis. Yielded 1.2 liters of amber colored fluid. Pt tolerated procedure well. No immediate complications.  Specimen was not sent for labs. CXR ordered.  Tsosie Billing D PA-C 06/05/2015 2:52 PM

## 2015-06-05 NOTE — Telephone Encounter (Signed)
Pt reports she feels better after centesis today.  Let pt know pleurx would be placed next Thursday, NPO after 7 am, driver needed, arrive [facility]  Also let pt know home health order placed and pleurx supply order placed.  Pt voiced understanding.

## 2015-06-05 NOTE — Progress Notes (Signed)
Telephone Advice Record rcvd from [facility] dtd 06/05/15 7:52 am.  Sent to scan.    PLeurX placement ordered.  Scheduled for June 30.  Pleurx prescription placed - service to start 7/1.  Sent to scan.  Home health order placed - service to start week of 7/5.  Sent to scan.  Notified Kristen - Phelan of order.  Attempted to call patient numerous times to advice of appt d/t for pleurx placement - no answer.  LMOVM to return call to clinic

## 2015-06-06 ENCOUNTER — Ambulatory Visit: Payer: 59

## 2015-06-08 ENCOUNTER — Ambulatory Visit: Payer: 59

## 2015-06-08 ENCOUNTER — Other Ambulatory Visit: Payer: Self-pay | Admitting: *Deleted

## 2015-06-08 ENCOUNTER — Other Ambulatory Visit: Payer: 59

## 2015-06-08 ENCOUNTER — Other Ambulatory Visit (HOSPITAL_BASED_OUTPATIENT_CLINIC_OR_DEPARTMENT_OTHER): Payer: 59

## 2015-06-08 ENCOUNTER — Ambulatory Visit (HOSPITAL_BASED_OUTPATIENT_CLINIC_OR_DEPARTMENT_OTHER): Payer: 59 | Admitting: Nurse Practitioner

## 2015-06-08 ENCOUNTER — Ambulatory Visit (HOSPITAL_BASED_OUTPATIENT_CLINIC_OR_DEPARTMENT_OTHER): Payer: 59

## 2015-06-08 ENCOUNTER — Ambulatory Visit (HOSPITAL_COMMUNITY)
Admission: RE | Admit: 2015-06-08 | Discharge: 2015-06-08 | Disposition: A | Discharge status: Home / Self Care | Payer: 59 | Source: Ambulatory Visit | Attending: Oncology | Admitting: Oncology

## 2015-06-08 ENCOUNTER — Encounter: Payer: Self-pay | Admitting: Nurse Practitioner

## 2015-06-08 VITALS — BP 117/70 | HR 96 | Temp 98.6°F | Resp 20

## 2015-06-08 VITALS — BP 120/80 | HR 123 | Temp 99.4°F | Resp 24 | Wt 173.7 lb

## 2015-06-08 DIAGNOSIS — C799 Secondary malignant neoplasm of unspecified site: Secondary | ICD-10-CM | POA: Insufficient documentation

## 2015-06-08 DIAGNOSIS — Z95828 Presence of other vascular implants and grafts: Secondary | ICD-10-CM

## 2015-06-08 DIAGNOSIS — C50312 Malignant neoplasm of lower-inner quadrant of left female breast: Secondary | ICD-10-CM

## 2015-06-08 DIAGNOSIS — C50919 Malignant neoplasm of unspecified site of unspecified female breast: Secondary | ICD-10-CM

## 2015-06-08 DIAGNOSIS — C770 Secondary and unspecified malignant neoplasm of lymph nodes of head, face and neck: Secondary | ICD-10-CM

## 2015-06-08 DIAGNOSIS — D696 Thrombocytopenia, unspecified: Secondary | ICD-10-CM | POA: Diagnosis not present

## 2015-06-08 DIAGNOSIS — K1231 Oral mucositis (ulcerative) due to antineoplastic therapy: Secondary | ICD-10-CM | POA: Diagnosis not present

## 2015-06-08 LAB — COMPREHENSIVE METABOLIC PANEL (CC13)
ALT: 75 U/L — AB (ref 0–55)
AST: 70 U/L — AB (ref 5–34)
Albumin: 2 g/dL — ABNORMAL LOW (ref 3.5–5.0)
Alkaline Phosphatase: 152 U/L — ABNORMAL HIGH (ref 40–150)
Anion Gap: 9 mEq/L (ref 3–11)
BILIRUBIN TOTAL: 1.3 mg/dL — AB (ref 0.20–1.20)
BUN: 11.6 mg/dL (ref 7.0–26.0)
CHLORIDE: 102 meq/L (ref 98–109)
CO2: 29 mEq/L (ref 22–29)
CREATININE: 0.6 mg/dL (ref 0.6–1.1)
Calcium: 8.7 mg/dL (ref 8.4–10.4)
EGFR: >90 mL/min/{1.73_m2} (ref >90)
Glucose: 105 mg/dl (ref 70–140)
Potassium: 4.2 mEq/L (ref 3.5–5.1)
Sodium: 140 mEq/L (ref 136–145)
Total Protein: 5.2 g/dL — ABNORMAL LOW (ref 6.4–8.3)

## 2015-06-08 LAB — CBC WITH DIFFERENTIAL/PLATELET
BASO%: 0.5 % (ref 0.0–2.0)
BASOS ABS: 0 10*3/uL (ref 0.0–0.1)
EOS ABS: 0 10*3/uL (ref 0.0–0.5)
EOS%: 0.5 % (ref 0.0–7.0)
HEMATOCRIT: 28.4 % — AB (ref 34.8–46.6)
HEMOGLOBIN: 9.2 g/dL — AB (ref 11.6–15.9)
LYMPH%: 33 % (ref 14.0–49.7)
MCH: 30 pg (ref 25.1–34.0)
MCHC: 32.4 g/dL (ref 31.5–36.0)
MCV: 92.5 fL (ref 79.5–101.0)
MONO#: 0 10*3/uL — ABNORMAL LOW (ref 0.1–0.9)
MONO%: 1.1 % (ref 0.0–14.0)
NEUT%: 64.9 % (ref 38.4–76.8)
NEUTROS ABS: 1.2 10*3/uL — AB (ref 1.5–6.5)
RBC: 3.07 10*6/uL — AB (ref 3.70–5.45)
RDW: 19.7 % — ABNORMAL HIGH (ref 11.2–14.5)
WBC: 1.9 10*3/uL — ABNORMAL LOW (ref 3.9–10.3)
lymph#: 0.6 10*3/uL — ABNORMAL LOW (ref 0.9–3.3)
nRBC: 0 % (ref 0–0)

## 2015-06-08 LAB — TECHNOLOGIST REVIEW

## 2015-06-08 LAB — ABO/RH: ABO/RH(D): A POS

## 2015-06-08 MED ORDER — ACETAMINOPHEN 325 MG PO TABS
650.0000 mg | ORAL_TABLET | Freq: Once | ORAL | Status: AC
  Administered 2015-06-08: 650 mg via ORAL

## 2015-06-08 MED ORDER — SODIUM CHLORIDE 0.9 % IJ SOLN
10.0000 mL | INTRAMUSCULAR | Status: DC | PRN
  Administered 2015-06-08 (×2): 10 mL via INTRAVENOUS
  Filled 2015-06-08: qty 10

## 2015-06-08 MED ORDER — SODIUM CHLORIDE 0.9 % IV SOLN
250.0000 mL | Freq: Once | INTRAVENOUS | Status: AC
  Administered 2015-06-08: 250 mL via INTRAVENOUS

## 2015-06-08 MED ORDER — ACETAMINOPHEN 325 MG PO TABS
ORAL_TABLET | ORAL | Status: AC
  Filled 2015-06-08: qty 2

## 2015-06-08 MED ORDER — DIPHENHYDRAMINE HCL 25 MG PO CAPS
ORAL_CAPSULE | ORAL | Status: AC
  Filled 2015-06-08: qty 1

## 2015-06-08 MED ORDER — HEPARIN SOD (PORK) LOCK FLUSH 100 UNIT/ML IV SOLN
500.0000 [IU] | Freq: Every day | INTRAVENOUS | Status: AC | PRN
  Administered 2015-06-08: 500 [IU]
  Filled 2015-06-08: qty 5

## 2015-06-08 MED ORDER — DIPHENHYDRAMINE HCL 25 MG PO CAPS
25.0000 mg | ORAL_CAPSULE | Freq: Once | ORAL | Status: AC
  Administered 2015-06-08: 25 mg via ORAL

## 2015-06-08 NOTE — Patient Instructions (Signed)
Platelet Transfusion Information °This is information about transfusions of platelets. Platelets are tiny cells made by the bone marrow and found in the blood. When a blood vessel is damaged, platelets rush to the damaged area to help form a clot. This begins the healing process. When platelets get very low, your blood may have trouble clotting. This may be from: °· Illness. °· Blood disorder. °· Chemotherapy to treat cancer. °Often, lower platelet counts do not cause problems.  °Platelets usually last for 7 to 10 days. If they are not used in an injury, they are broken down by the liver or spleen. °Symptoms of low platelet count include: °· Nosebleeds. °· Bleeding gums. °· Heavy periods. °· Bruising and tiny blood spots in the skin. °¨ Pinpoint spots of bleeding (petechiae). °¨ Larger bruises (purpura). °· Bleeding can be more serious if it happens in the brain or bowel. °Platelet transfusions are often used to keep the platelet count at an acceptable level. Serious bleeding due to low platelets is uncommon. °RISKS AND COMPLICATIONS °Severe side effects from platelet transfusions are uncommon. Minor reactions may include: °· Itching. °· Rashes. °· High temperature and shivering. °Medications are available to stop transfusion reactions. Let your health care provider know if you develop any of the above problems.  °If you are having platelet transfusions frequently, they may get less effective. This is called becoming refractory to platelets. It is uncommon. This can happen from non-immune causes and immune causes. Non-immune causes include: °· High temperatures. °· Some medications. °· An enlarged spleen. °Immune causes happen when your body discovers the platelets are not your own and begins making antibodies against them. The antibodies kill the platelets quickly. Even with platelet transfusions, you may still notice problems with bleeding or bruising. Let your health care providers know about this. Other things  can be done to help if this happens.  °BEFORE THE PROCEDURE  °· Your health care provider will check your platelet count regularly. °· If the platelet count is too low, it may be necessary to have a platelet transfusion. °· This is more important before certain procedures with a risk of bleeding, such as a spinal tap. °· Platelet transfusion reduces the risk of bleeding during or after the procedure. °· Except in emergencies, giving a transfusion requires a written consent. °Before blood is taken from a donor, a complete history is taken to make sure the person has no history of previous diseases, nor engages in risky social behavior. Examples of this are intravenous drug use or sexual activity with multiple partners. This could lead to infected blood or blood products being used. This history is taken in spite of the extensive testing to make sure the blood is safe. All blood products transfused are tested to make sure it is a match for the person getting the blood. It is also checked for infections. Blood is the safest it has ever been. The risk of getting an infection is very low. °PROCEDURE °· The platelets are stored in small plastic bags that are kept at a low temperature. °· Each bag is called a unit and sometimes two units are given. They are given through an intravenous line by drip infusion over about one-half hour. °· Usually blood is collected from multiple people to get enough to transfuse. °· Sometimes, the platelets are collected from a single person. This is done using a special machine that separates the platelets from the blood. The machine is called an apheresis machine. Platelets collected in this   way are called apheresed platelets. Apheresed platelets reduce the risk of becoming sensitive to the platelets. This lowers the chances of having a transfusion reaction. °· As it only takes a short time to give the platelets, this treatment can be given in an outpatient department. Platelets can also be  given before or after other treatments. °SEEK IMMEDIATE MEDICAL CARE IF: °You have any of the following symptoms over the next 12 hours or several days: °· Shaking chills. °· Fever with a temperature greater than 102°F (38.9°C) develops. °· Back pain or muscle pain. °· People around you feel you are not acting correctly, or you are confused. °· Blood in the urine or bowel movements, or bleeding from any place in your body. °· Shortness of breath, or difficulty breathing. °· Dizziness. °· Fainting. °· You break out in a rash or develop hives. °· Decrease in the amount of urine you are putting out, or the urine turns a dark color or changes to pink, red, or brown. °· A severe headache or stiff neck. °· Bruising more easily. °Document Released: 09/25/2007 Document Revised: 04/14/2014 Document Reviewed: 09/25/2007 °[employer]® Patient Information ©2015 [employer]. This information is not intended to replace advice given to you by your health care provider. Make sure you discuss any questions you have with your health care provider. ° °

## 2015-06-08 NOTE — Progress Notes (Signed)
ID: Kristin Hoffman OB: 10-06-1966  MR#: [id number]  HUT#:[id number]  PCP: Drake Leach, MD GYN:  [name] SU: [name] OTHER MD: [name], [name]  CHIEF COMPLAINT:  Stage IV breast cancer  CURRENT TREATMENT: carboplatin/ gemcitabine  BREAST CANCER HISTORY: This patient was previously followed by [name], and was transferred to [name] service as of 08/20/2013.  At the age of 49, the patient had a screening mammogram as a baseline. She had recently given birth and was on birth control pills at that time. The mammogram in November 2006 showed an area of architectural distortion in the left lower inner quadrant. Subsequently an ultrasound was obtained of the left breast showing a 2.0 x 1.5 x 1.4 cm mass, at the 8:00 position, 4 cm from the nipple. A core biopsy performed on 10/21/2005 showed an invasive mammary carcinoma, ER +70%, PR +41%, HER-2/neu negative, with proliferation marker of 38%. [phone])  Breast MRI on 11/08/2005 confirmed a 2.5 cm spiculated mass in the left lower inner quadrant with 3 small satellite nodules adjacent to the primary mass: 3.5 cm posterior medial to the primary mass, measuring 9 mm; 1.5 cm anterior medial to the primary mass measuring 5 mm; and 2 cm medial to the primary mass measuring 5 mm. No axillary adenopathy was noted. No suspicious masses or enhancement are noted in the right breast.  Kristin Hoffman underwentt left lumpectomy under the care of Dr. 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 2 of 23 lymph nodes were involved.  Margins were clear.  She received adjuvant chemotherapy consisting of 6 q. three-week doses of docetaxel/doxorubicin/cyclophosphamide given between January 2007 and may of 2007, with last dose on 04/20/2006. She underwent radiation therapy completed 07/11/2006, after which she began on tamoxifen in early August  2007.  She underwent hysterectomy with bilateral salpingo-oophorectomy on 07/15/2008, and was started on letrozole in October of 2009.  A mammogram 11/10/2009 showed microcalcifications in the left breast, and a subsequent biopsy on 11/18/2009 confirmed invasive mammary carcinoma in the left breast. PET and CT of the chest showed no evidence of metastasis in January 2011.  She underwent bilateral mastectomies 01/25/2010, with the right breast showing no evidence of malignancy; in the left breast showing a 1.0 cm grade 2 invasive ductal carcinoma with high-grade DCIS. Tumor was ER +22%, PR negative, HER-2/neu negative, with proliferation marker of 13%. Margins were clear. Patient underwent concurrent left latissimus flap reconstruction and right implant reconstruction.  She received additional chemotherapy with one cycle of carboplatin/gemcitabine given on 03/11/2010. She then received 5 q. three-week cycles of IV CMF between 03/25/2010 and 06/17/2010.  She was started on exemestane in January 2012 and continued until late November 2013 when she was hospitalized for apparent TIA.   Her subsequent history is as detailed below  INTERVAL HISTORY: Kristin Hoffman returns today for follow up of her metastatic breast cancer. Today is day 14 cycle 1 of 3 cycles planned of carboplatin and Gemzar given on days one and 8 of each cycle. She receives Neupogen on days 2 and 3 and 4 of each cycle and then onpro on day 9.   REVIEW OF SYSTEMS: Again, Kristin Hoffman required a thoracentesis on 06/05/15 for 1.2 liters of fluid that caused significant shortness of breath. She is on 2L O2 PRN and it is drying her nose out. She has bloody drainage from both nostrils. She is short of  breath with exertion now, and barely has a cough. She denies hemoptysis. She is significantly fatigued after being treated twice in 2 weeks. She slept for nearly 10 hours this past Sunday. She denies fevers, chills, nausea, vomiting, or changes in bowel or bladder  habits. She takes tramadol and advil PRN pain during the day, and percocet at night. The pain is in her bilateral back and sides. She was eating well, and people brought lots of food to her house, but now her mouth is tender. A detailed review of systems is otherwise stable.  PAST MEDICaL HISTORY: Past Medical History  Diagnosis Date  . Depression   . Reflux   . Hx-TIA (transient ischemic attack) 11/22/2013  . Chronic headaches 11/22/2013  . Anxiety 11/22/2013  . Breast cancer   . Cancer   . Breast cancer metastasized to multiple sites 12/24/2013    PAST SURGICAL HISTORY: Past Surgical History  Procedure Laterality Date  . Mastectomy w/ nodes partial    . Mastectomy    . Breast reconstruction    . Portacath placement      x 2  . Portacath removal      x 2  . Abdominal hysterectomy    . Tee without cardioversion  11/12/2012    Procedure: TRANSESOPHAGEAL ECHOCARDIOGRAM (TEE);  Surgeon: Candee Furbish, MD;  Location: [facility] ENDOSCOPY;  Service: Cardiovascular;  Laterality: N/A;  This TEE may be Dr. Marlou Porch or a [facility] Dr    FAMILY HISTORY Both parents are alive and well. Patient has one sister who is 37 years younger and is in good health. No other history of breast or ovarian cancer in the family. Family History  Problem Relation Age of Onset  . Diabetes Mellitus II Neg Hx   . Hypertension Neg Hx     GYNECOLOGIC HISTORY:   (Updated January 2015) G2P2, menarche at age 33 with irregular menses. On birth control pills in the past. On Clomid to induce ovulation with first pregnancy. Also had preeclampsia with first pregnancy. Status post hysterectomy and bilateral salpingo-oophorectomy in August 2009.  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 She is originally from [place]. She's been married to Wilkerson, for 13 years. He works as an Pharmacist, hospital at [employer].   ADVANCED DIRECTIVES:  Not in  place; Not in place. At the clinic visit 06/08/2015 the patient was given the appropriate forms to complete and notarize at her discretion.    HEALTH MAINTENANCE:  (Updated 11/22/2013) History  Substance Use Topics  . Smoking status: Never Smoker   . Smokeless tobacco: Never Used  . Alcohol Use: Yes     Comment: rarely     Colonoscopy:  Never  PAP: S/P LAVH/BSO in August 2009  Bone density: Never  Lipid panel: Not on file   Allergies  Allergen Reactions  . Afinitor [Everolimus] Hives and Itching    Current Outpatient Prescriptions  Medication Sig Dispense Refill  . acidophilus (RISAQUAD) CAPS capsule Take 1 capsule by mouth daily.    Marland Kitchen aspirin EC 81 MG tablet Take 81 mg by mouth daily with breakfast.    . Biotin 5000 MCG CAPS Take 1 capsule (5,000 mcg total) by mouth daily. 30 capsule   . cetirizine (ZYRTEC) 10 MG tablet Take 10 mg by mouth at bedtime.     . Cholecalciferol 2000 UNITS CHEW Chew 1 tablet by mouth daily with breakfast.     .  escitalopram (LEXAPRO) 20 MG tablet Take 20 mg by mouth daily with breakfast.     . Ibuprofen (ADVIL) 200 MG CAPS Take 2 capsules (400 mg total) by mouth 3 (three) times daily with meals as needed. (Patient taking differently: Take 400 mg by mouth every 6 (six) hours as needed (for pain). ) 120 each 0  . LORazepam (ATIVAN) 0.5 MG tablet TAKE 1 TABLET BY MOUTH AS NEEDED FOR NAUSEA AND VOMITING (Patient taking differently: TAKE 1 TABLET BY MOUTH every night at bedtime) 30 tablet 0  . Melatonin 10 MG TABS Take 1 tablet by mouth at bedtime.    . montelukast (SINGULAIR) 10 MG tablet Take 10 mg by mouth daily with breakfast.     . Multiple Vitamin (MULTIVITAMIN) tablet Take 1 tablet by mouth daily.    Marland Kitchen omeprazole (PRILOSEC) 40 MG capsule Take 40 mg by mouth daily.     . ondansetron (ZOFRAN) 8 MG tablet Take 8 mg by mouth 2 (two) times daily. Starts day after chemo    . oxyCODONE-acetaminophen (PERCOCET/ROXICET) 5-325 MG per tablet Take 1 tablet by  mouth every 4 (four) hours as needed for moderate pain.   0  . PRESCRIPTION MEDICATION Chemo - [facility]    . traMADol (ULTRAM) 50 MG tablet Take 1 tablet (50 mg total) by mouth every 6 (six) hours as needed. (Patient taking differently: Take 50 mg by mouth every 6 (six) hours as needed for moderate pain. ) 30 tablet 3  . docusate sodium (COLACE) 100 MG capsule Take 1 capsule (100 mg total) by mouth 2 (two) times daily. (Patient not taking: Reported on 06/08/2015) 20 capsule 0  . nystatin (MYCOSTATIN) 100000 UNIT/ML suspension Take 5 mLs (500,000 Units total) by mouth 4 (four) times daily. (Patient not taking: Reported on 06/08/2015) 240 mL 1  . polyethylene glycol (MIRALAX / GLYCOLAX) packet Take 17 g by mouth daily. (Patient not taking: Reported on 06/08/2015) 14 each 0  . prochlorperazine (COMPAZINE) 10 MG tablet Take 10 mg by mouth every 6 (six) hours as needed for nausea or vomiting.      No current facility-administered medications for this visit.    OBJECTIVE: Young white woman in no acute distress Filed Vitals:   06/08/15 1318  BP: 120/80  Pulse: 123  Resp: 24  Body mass index is 30.78 kg/(m^2).  ECOG: 2 Filed Weights   06/08/15 1318  Weight: 173 lb 11.2 oz (78.79 kg)     Skin: warm, dry, scabs to skin to right and left of eyes, but no irritaion to eyes themselves HEENT: sclerae anicteric, conjunctivae pink. Sores to bilateral buccal mucosa. Lymph Nodes: No cervical or supraclavicular lymphadenopathy  Lungs: clear to auscultation bilaterally, no rales, wheezes, or rhonci  Heart: regular rate and rhythm  Abdomen: round, soft, non tender, positive bowel sounds  Musculoskeletal: No focal spinal tenderness, no peripheral edema  Neuro: non focal, well oriented, positive affect  Breasts: deferred  LAB RESULTS:   Lab Results  Component Value Date   WBC 1.9* 06/08/2015   NEUTROABS 1.2* 06/08/2015   HGB 9.2* 06/08/2015   HCT 28.4* 06/08/2015   MCV 92.5 06/08/2015   PLT 10 Few Large  platelets present* 06/08/2015      Chemistry      Component Value Date/Time   NA 140 06/08/2015 1246   NA 138 05/30/2015 0525   K 4.2 06/08/2015 1246   K 3.7 05/30/2015 0525   CL 104 05/30/2015 0525   CL 104 11/14/2012 1407  CO2 29 06/08/2015 1246   CO2 27 05/30/2015 0525   BUN 11.6 06/08/2015 1246   BUN 16 05/30/2015 0525   CREATININE 0.6 06/08/2015 1246   CREATININE 0.68 05/30/2015 0525      Component Value Date/Time   CALCIUM 8.7 06/08/2015 1246   CALCIUM 8.2* 05/30/2015 0525   ALKPHOS 152* 06/08/2015 1246   ALKPHOS 88 05/30/2015 0525   AST 70* 06/08/2015 1246   AST 193* 05/30/2015 0525   ALT 75* 06/08/2015 1246   ALT 76* 05/30/2015 0525   BILITOT 1.30* 06/08/2015 1246   BILITOT 1.0 05/30/2015 0525      STUDIES: Dg Chest 1 View  06/05/2015   CLINICAL DATA:  Status post left thoracentesis  EXAM: CHEST  1 VIEW  COMPARISON:  05/29/2015  FINDINGS: Cardiac shadow is within normal limits. The right chest wall port is again seen. Right basilar mass is again seen and stable. There is been interval left-sided thoracentesis with decrease in the appearance of the left-sided pleural effusion. No pneumothorax is noted. There remains a loculated component superiorly.  IMPRESSION: No pneumothorax following thoracentesis on the left. Persistent right basilar mass lesion and loculated component of pleural fluid on the left is noted.   Electronically Signed   By: Inez Catalina M.D.   On: 06/05/2015 15:16   Dg Chest 1 View  05/29/2015   CLINICAL DATA:  Post LEFT thoracentesis, metastatic breast cancer  EXAM: CHEST  1 VIEW  COMPARISON:  Repeat exam 1633 hours compared to 1537 hours  FINDINGS: RIGHT jugular Port-A-Cath stable tip projecting over SVC.  Decrease in LEFT pleural effusion post thoracentesis.  No pneumothorax.  Persistent atelectasis infiltrate at LEFT base.  Large RIGHT basilar metastasis and scattered interstitial infiltrates in the mid lower RIGHT lung unchanged.  Thoracolumbar  scoliosis and slight osseous demineralization.  IMPRESSION: No pneumothorax following LEFT thoracentesis.   Electronically Signed   By: Lavonia Dana M.D.   On: 05/29/2015 16:52   Dg Chest 1 View  05/22/2015   CLINICAL DATA:  Post left-sided thoracentesis. History of breast cancer  EXAM: CHEST  1 VIEW  COMPARISON:  CT the chest, abdomen and pelvis - earlier same day  FINDINGS: Grossly unchanged cardiac silhouette and mediastinal contours with partial obscuration of the right heart border secondary to known dominant metastasis within the anterior aspect the right upper lobe. Stable position of support apparatus. Interval reduction in persistent small partially loculated sided effusion post thoracentesis. No pneumothorax. There is prominence of the left pulmonary hila compatible with the known left-sided anterior mediastinal adenopathy. Surgical clips overlie the left axilla the superior aspect the left mid hemi abdomen. No acute osseus abnormalities.  IMPRESSION: 1. Interval reduction in persistent small loculated left-sided effusion post thoracentesis. No pneumothorax. 2. Findings compatible with extensive metastatic disease to the chest. Please refer to contrast-enhanced chest CT performed earlier same day.   Electronically Signed   By: Sandi Mariscal M.D.   On: 05/22/2015 16:59   Ct Soft Tissue Neck W Contrast  05/22/2015   CLINICAL DATA:  49 year old female with metastatic breast cancer. Palpable abnormality on the left neck. Subsequent encounter.  EXAM: CT NECK WITH CONTRAST  TECHNIQUE: Multidetector CT imaging of the neck was performed using the standard protocol following the bolus administration of intravenous contrast.  CONTRAST:  19m OMNIPAQUE IOHEXOL 300 MG/ML SOLN in conjunction with contrast enhanced imaging of the chest, abdomen, and pelvis reported separately.  COMPARISON:  PET-CT 12/08/2014. Head CT without contrast 11/08/2012.  FINDINGS: Pharynx and  larynx: No laryngeal or pharyngeal mass.  Negative parapharyngeal spaces. Negative retropharyngeal space. Negative sublingual space.  Salivary glands: Submandibular and parotid glands are within normal limits.  Thyroid: Negative.  Lymph nodes:  Palpable area of concern marked along the anterior base of the left neck. Underlying large 34 x 35 x 30 mm (AP by transverse by CC) heterogeneously enhancing soft tissue mass most resembles lymph nodal metastasis (left level 4) with extracapsular extension of disease. This nodal mass abuts the right subclavian neurovascular bundle. The nearby a left IJ and subclavian vein confluence appears to remain patent, but otherwise there is little left subclavian vein intravascular contrast.  There is a small calcified spiculated soft tissue nodule located just posterior to the left thyroid lobe which probably reflects previously treated nodal disease (series 7, image 59).  Other cervical lymph node stations are within normal limits.  However, there is abundant abnormal superior mediastinal and right peritracheal lymphadenopathy, see chest CT reported separately.  Vascular: Right side porta cath in place. Internal jugular veins are patent. Major arterial structures in the neck and at the skullbase are patent.  Limited intracranial: Negative.  Visualized orbits: Negative.  Mastoids and visualized paranasal sinuses: Visualized paranasal sinuses and mastoids are clear.  Skeleton: No acute or suspicious osseous lesion in the neck.  Upper chest: Abnormal, reported separately.  IMPRESSION: 1. Left anterior lower neck palpable abnormality corresponds to left level IV metastatic nodal disease with extra-capsular extension. Conglomerate nodal disease here measures up to 35 mm. 2. No other metastatic disease identified in the neck. 3. Abnormal chest and mediastinum, reported separately.   Electronically Signed   By: Genevie Ann M.D.   On: 05/22/2015 15:28   Ct Chest W Contrast  05/22/2015   CLINICAL DATA:  Subsequent encounter for left  breast cancer status post bilateral mastectomy with liver mets.  EXAM: CT CHEST, ABDOMEN, AND PELVIS WITH CONTRAST  TECHNIQUE: Multidetector CT imaging of the chest, abdomen and pelvis was performed following the standard protocol during bolus administration of intravenous contrast.  CONTRAST:  156m OMNIPAQUE IOHEXOL 300 MG/ML  SOLN  COMPARISON:  [facility]  FINDINGS: CT CHEST FINDINGS  Mediastinum/Nodes: Tip of right-sided Port-A-Cath is in the right atrium. Patient is status post bilateral mastectomy and reconstruction.  Substantial disease progression noted in the chest since 12/28/ 2015. 3.4 x 2.6 cm left supraclavicular lymph node not mentioned previously. 2.6 x 2.5 cm lymph node in the superior left mediastinum mentioned on the previous report presumably represents the 3.8 x 4.3 cm necrotic lymph node seen in the prevascular space today. 2.6 cm necrotic high right paratracheal lymph node not described previously.  5.6 x 8.2 cm necrotic pleural-based lesion in the anterior upper left hemi thorax is apparently new in the interval. Small left pleural effusion described previously with moderate to large left pleural effusion evident today. Multiple left-sided pleural nodules are evident. Disease in the left hemi thorax displaces cardiomediastinal structures to the right.  Lungs/Pleura: 3.3 cm right middle lobe lung mass is contiguous with the anterior pleura. Fine detail of the lungs is obscured by breathing motion. Multiple bilateral pulmonary nodules measuring in the 5-10 mm size range are evident. There is left upper and lower lobe collapse/ consolidation.  Musculoskeletal: Bone windows reveal no worrisome lytic or sclerotic osseous lesions.  CT ABDOMEN AND PELVIS FINDINGS  Hepatobiliary: Heterogeneously enhancing mass in the dome of the liver measures 6.9 x 7.6  cm. Previous study reported a hepatic dome lesion measuring 6.5 x 5.9 cm. 2.7 cm  lesion is identified in the caudate lobe. Gallbladder is decompressed. No intrahepatic or extrahepatic biliary dilation.  Pancreas: 3.2 x 4.3 cm mass in the head of the pancreas results and mild distention of the main pancreatic duct. A pancreatic mass was described in the previous report, measuring 3.3 x 2.9 cm at that time.  Spleen: No splenomegaly. No focal mass lesion.  Adrenals/Urinary Tract: No adrenal nodule or mass. Tiny cyst noted in the left kidney. No hydronephrosis. No hydroureter. Urinary bladder is unremarkable.  Stomach/Bowel: Stomach is nondistended. No gastric wall thickening. No evidence of outlet obstruction. Duodenum is normally positioned as is the ligament of Treitz. No small bowel wall thickening. No small bowel dilatation. Terminal ileum normal. Appendix normal. No gross colonic mass. No colonic wall thickening. No substantial diverticular change.  Vascular/Lymphatic: No abdominal aortic aneurysm. 2.8 cm necrotic portal caval lymph node is identified. Portal vein is markedly attenuated as it passes between the pancreatic head lesion and the portal caval lymph node, but the portal vein does appear to remain patent. Splenic vein and superior mesenteric vein are patent. No para-aortic lymphadenopathy. No pelvic sidewall lymphadenopathy.  Reproductive: Uterus is surgically absent.  No adnexal mass.  Other: No intraperitoneal free fluid.  Musculoskeletal: Bone windows reveal no worrisome lytic or sclerotic osseous lesions.  IMPRESSION: Comparing to the report of a study from 03/23/2015, there has been marked progression of metastatic disease in the chest and abdomen. This involves left supraclavicular, mediastinal, pleural , lung parenchymal, liver, pancreatic, and hepatoduodenal ligament nodal metastases.  Moderate to large left pleural effusion is associated with left upper and lower lobe collapse/ consolidation.  Compression of the portal vein between the pancreatic head mass and the  hepatoduodenal ligament lymphadenopathy.   Electronically Signed   By: Misty Stanley M.D.   On: 05/22/2015 15:40   Ct Angio Chest Pe W/cm &/or Wo Cm  05/29/2015   CLINICAL DATA:  Worsening shortness of breath for 1.5 weeks. Now with chest pain on breathing.  EXAM: CT ANGIOGRAPHY CHEST WITH CONTRAST  TECHNIQUE: Multidetector CT imaging of the chest was performed using the standard protocol during bolus administration of intravenous contrast. Multiplanar CT image reconstructions and MIPs were obtained to evaluate the vascular anatomy.  CONTRAST:  6m OMNIPAQUE IOHEXOL 350 MG/ML SOLN  COMPARISON:  05/22/2015  FINDINGS: Technically adequate study with good opacification of the central and segmental pulmonary arteries. No focal filling defects demonstrated. No evidence of significant pulmonary embolus.  Mild cardiac enlargement. Normal caliber thoracic aorta. No aortic dissection or aneurysm. Great vessel origins are patent. Aberrant right subclavian artery. Calcified lymph nodes in the mediastinum could represent old granulomatous infection or treated tumor. Enlarged lymph node in the left supraclavicular region measuring 3.6 cm. Enlarged right paratracheal lymph node measuring 2.4 cm. These are similar in size to prior study and consistent with metastasis. Large bilateral pleural masses, measuring 11.8 x 6.1 cm on the left and 3.4 cm on the right. Small bilateral pleural effusions. Basilar atelectasis. 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 Appearances are similar to prior study. Multiple metastases in the liver with heterogeneous parenchymal enhancement pattern corresponding to known portal venous compression. Portal veins are not entirely included within the study for comparison. Bilateral breast implants. No destructive bone lesions are appreciated.  Review of the MIP images confirms the above findings.  IMPRESSION: Diffuse metastatic disease  in  the chest involving left clavicular and right paratracheal lymph nodes, extensive pleural metastases bilaterally, bilateral pleural effusions, interstitial and nodular parenchymal metastases, and liver metastases. Appearances are similar to previous study. No evidence of significant pulmonary embolus.   Electronically Signed   By: Lucienne Capers M.D.   On: 05/29/2015 23:17   Ct Abdomen Pelvis W Contrast  05/22/2015   CLINICAL DATA:  Subsequent encounter for left breast cancer status post bilateral mastectomy with liver mets.  EXAM: CT CHEST, ABDOMEN, AND PELVIS WITH CONTRAST  TECHNIQUE: Multidetector CT imaging of the chest, abdomen and pelvis was performed following the standard protocol during bolus administration of intravenous contrast.  CONTRAST:  186m OMNIPAQUE IOHEXOL 300 MG/ML  SOLN  COMPARISON:  [facility]  FINDINGS: CT CHEST FINDINGS  Mediastinum/Nodes: Tip of right-sided Port-A-Cath is in the right atrium. Patient is status post bilateral mastectomy and reconstruction.  Substantial disease progression noted in the chest since 12/28/ 2015. 3.4 x 2.6 cm left supraclavicular lymph node not mentioned previously. 2.6 x 2.5 cm lymph node in the superior left mediastinum mentioned on the previous report presumably represents the 3.8 x 4.3 cm necrotic lymph node seen in the prevascular space today. 2.6 cm necrotic high right paratracheal lymph node not described previously.  5.6 x 8.2 cm necrotic pleural-based lesion in the anterior upper left hemi thorax is apparently new in the interval. Small left pleural effusion described previously with moderate to large left pleural effusion evident today. Multiple left-sided pleural nodules are evident. Disease in the left hemi thorax displaces cardiomediastinal structures to the right.  Lungs/Pleura: 3.3 cm right middle lobe lung mass is contiguous with the anterior pleura. Fine detail of the  lungs is obscured by breathing motion. Multiple bilateral pulmonary nodules measuring in the 5-10 mm size range are evident. There is left upper and lower lobe collapse/ consolidation.  Musculoskeletal: Bone windows reveal no worrisome lytic or sclerotic osseous lesions.  CT ABDOMEN AND PELVIS FINDINGS  Hepatobiliary: Heterogeneously enhancing mass in the dome of the liver measures 6.9 x 7.6 cm. Previous study reported a hepatic dome lesion measuring 6.5 x 5.9 cm. 2.7 cm lesion is identified in the caudate lobe. Gallbladder is decompressed. No intrahepatic or extrahepatic biliary dilation.  Pancreas: 3.2 x 4.3 cm mass in the head of the pancreas results and mild distention of the main pancreatic duct. A pancreatic mass was described in the previous report, measuring 3.3 x 2.9 cm at that time.  Spleen: No splenomegaly. No focal mass lesion.  Adrenals/Urinary Tract: No adrenal nodule or mass. Tiny cyst noted in the left kidney. No hydronephrosis. No hydroureter. Urinary bladder is unremarkable.  Stomach/Bowel: Stomach is nondistended. No gastric wall thickening. No evidence of outlet obstruction. Duodenum is normally positioned as is the ligament of Treitz. No small bowel wall thickening. No small bowel dilatation. Terminal ileum normal. Appendix normal. No gross colonic mass. No colonic wall thickening. No substantial diverticular change.  Vascular/Lymphatic: No abdominal aortic aneurysm. 2.8 cm necrotic portal caval lymph node is identified. Portal vein is markedly attenuated as it passes between the pancreatic head lesion and the portal caval lymph node, but the portal vein does appear to remain patent. Splenic vein and superior mesenteric vein are patent. No para-aortic lymphadenopathy. No pelvic sidewall lymphadenopathy.  Reproductive: Uterus is surgically absent.  No adnexal mass.  Other: No intraperitoneal free fluid.  Musculoskeletal: Bone windows reveal no worrisome lytic or sclerotic osseous  lesions.   IMPRESSION: Comparing to the report of a study from 03/23/2015, there has been marked progression of metastatic disease in the chest and abdomen. This involves left supraclavicular, mediastinal, pleural , lung parenchymal, liver, pancreatic, and hepatoduodenal ligament nodal metastases.  Moderate to large left pleural effusion is associated with left upper and lower lobe collapse/ consolidation.  Compression of the portal vein between the pancreatic head mass and the hepatoduodenal ligament lymphadenopathy.   Electronically Signed   By: Misty Stanley M.D.   On: 05/22/2015 15:40   Dg Chest Port 1 View  05/29/2015   CLINICAL DATA:  Shortness of breath. Chest pain for the past week, worsening. Recent thoracentesis. Ongoing chemotherapy for breast cancer.  EXAM: PORTABLE CHEST - 1 VIEW  COMPARISON:  05/22/2015  FINDINGS: Power injectable right internal jugular Port-A-Cath tip: Cavoatrial junction.  Right middle lobe lung mass. Indistinct pulmonary vasculature. Abnormal bilateral interstitial accentuation. Layering large left pleural effusion, mostly lateral. Indistinct left cardiac border. No pneumothorax.  IMPRESSION: 1. Large left pleural effusion with known large pleural mass on the left. 2. Right middle lobe lung mass. 3. Interstitial pulmonary edema.   Electronically Signed   By: Van Clines M.D.   On: 05/29/2015 15:48   US Thoracentesis Asp Pleural Space W/img Guide  06/05/2015   INDICATION: Symptomatic recurrent left sided pleural effusion  EXAM: US THORACENTESIS ASP PLEURAL SPACE W/IMG GUIDE  COMPARISON:  05/29/15 thoracentesis.  MEDICATIONS: None  COMPLICATIONS: None immediate  TECHNIQUE: Informed written consent was obtained from the patient after a discussion of the risks, benefits and alternatives to treatment. A timeout was performed prior to the initiation of the procedure.  Initial ultrasound scanning demonstrates a left pleural effusion. The lower chest was prepped and draped in the usual  sterile fashion. 1% lidocaine was used for local anesthesia.  Under direct ultrasound guidance, a 19 gauge, 7-cm, Yueh catheter was introduced. An ultrasound image was saved for documentation purposes. The thoracentesis was performed. The catheter was removed and a dressing was applied. The patient tolerated the procedure well without immediate post procedural complication. The patient was escorted to have an upright chest radiograph.  FINDINGS: A total of approximately 1.2 liters of amber colored fluid was removed.  IMPRESSION: Successful ultrasound-guided left sided thoracentesis yielding 1.2 liters of pleural fluid.  Read By:  Tsosie Billing PA-C   Electronically Signed   By: Sandi Mariscal M.D.   On: 06/05/2015 15:35   US Thoracentesis Asp Pleural Space W/img Guide  05/29/2015   INDICATION: Symptomatic recurrent left sided pleural effusion  EXAM: US THORACENTESIS ASP PLEURAL SPACE W/IMG GUIDE  COMPARISON:  CXR 05/29/15.  MEDICATIONS: None  COMPLICATIONS: None immediate  TECHNIQUE: Informed written consent was obtained from the patient after a discussion of the risks, benefits and alternatives to treatment. A timeout was performed prior to the initiation of the procedure.  Initial ultrasound scanning demonstrates a left pleural effusion. The lower chest was prepped and draped in the usual sterile fashion. 1% lidocaine was used for local anesthesia.  Under direct ultrasound guidance, a 19 gauge, 7-cm, Yueh catheter was introduced. An ultrasound image was saved for documentation purposes. The thoracentesis was performed. The catheter was removed and a dressing was applied. The patient tolerated the procedure well without immediate post procedural complication. The patient was escorted to have an upright chest radiograph.  FINDINGS: A total of approximately 1.2 liters of serous/amber fluid was removed.  IMPRESSION: Successful ultrasound-guided left sided thoracentesis yielding 1.2 liters of pleural fluid.  Read By:   Tsosie Billing PA-C   Electronically Signed   By: Corrie Mckusick D.O.   On: 05/29/2015 16:38   US Thoracentesis Asp Pleural Space W/img Guide  05/22/2015   INDICATION: Breast cancer, dyspnea, left pleural effusion. Request is made for therapeutic left thoracentesis.  EXAM: ULTRASOUND GUIDED THERAPEUTIC LEFT THORACENTESIS  COMPARISON:  None.  MEDICATIONS: None  COMPLICATIONS: None immediate  TECHNIQUE: Informed written consent was obtained from the patient after a discussion of the risks, benefits and alternatives to treatment. A timeout was performed prior to the initiation of the procedure.  Initial ultrasound scanning demonstrates a moderate to large left pleural effusion. The lower chest was prepped and draped in the usual sterile fashion. 1% lidocaine was used for local anesthesia.  An ultrasound image was saved for documentation purposes. A 6 Fr Safe-T-Centesis catheter was introduced. The thoracentesis was performed. The catheter was removed and a dressing was applied. The patient did experience a mild vasovagal reaction post procedure which responded well to supine positioning and cool washcloth to the forehead. Patient was escorted to have an upright chest radiograph.  FINDINGS: A total of approximately 1.5 liters of amber/blood-tinged fluid was removed.  IMPRESSION: Successful ultrasound-guided therapeutic left sided thoracentesis yielding 1.5 liters of pleural fluid. Patient with mild vasovagal response post procedure which responded well to supine positioning and cool compress to the forehead. Vital signs were stable upon discharge.  Read by: Rowe Robert, PA-C   Electronically Signed   By: Aletta Edouard M.D.   On: 05/22/2015 17:29     ASSESSMENT: 49 y.o. [place] woman  (1)  status post left lumpectomy under the care of Dr. 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 2 of 23 lymph nodes were  involved.  Margins were clear.  (2) status post adjuvant chemotherapy consisting of 6 q. three-week doses of docetaxel/ doxorubicin/ cyclophosphamide given between January 2007 and may of 2007, with last dose on 04/20/2006.  (3) status post radiation therapy to the left breast, completed 07/11/2006  (4) began on tamoxifen in early August 2007 and continued until October 2009. She status post hysterectomy with bilateral salpingo-oophorectomy on 07/15/2008, and was started on letrozole in October of 2009.  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 PET and CT of the chest showed no evidence of metastasis in January 2011.  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 Margins were clear. Patient underwent concurrent left latissimus flap reconstruction and right implant reconstruction.  (7)  status post additional chemotherapy with one cycle of carboplatin/gemcitabine given on 03/11/2010. She then received 5 q. three-week cycles of IV CMF between 03/25/2010 and 06/17/2010.  (8) started on exemestane in January 2012 and continued until late November 2013 when she was hospitalized for apparent TIA at which time her exemestane was stopped and not resumed  (9) METASTATIC DISEASE: evidence of disease recurence noted on chest CT, liver MRI and PET scan in December 2014, with suspicious lung nodules, lymphadenopathy, and 3 lesions in the right lobe of the liver, but no evidence of bony disease and no evidence of brain involvement by brain MRI September 2014. Biopsy  of a left supraclavicular lymph node on 12/11/2013 confirmed metastatic carcinoma, estrogen receptor 45% positive,  progesterone receptor 17% positive, with no HER-2 amplification.  (10) fulvestrant started 12/03/2013, discontinued 03/25/2014 with progression  (11) exemestane/ everolimus started 04/04/2014; everolimus held 04/14/2014, with skin rash and mouth soreness; resumed at 5 mg a day as of 04/29/2014, discontinued 05/09/2014 with similar symptoms  (12) continueing exemestane, adding palbociclib 05/26/2014;   (a) palbociclib dose dropped to 75 mg every other day for 21 days beginning with cycle 4  (b) exemestane and Palbociclib discontinued December 2015 with evidence of progression  (13) [facility] referral placed 04/28/2049 -- re-sent 05/23/2014-- shows Pi3KCA and TP53 mutations  (a) [facility] one test requested 12/10/2014-- confirms [employer] results  (b) did not qualify for capecitabine/ BYL 719 trial because of elevated lipase  (14) started eribulin 12/26/2014, 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   (a) dpse dropped from 2g BID to 1.5g BID starting on 04/26/15  (b) stopped 05/17/2015 (after 4 cycles) with progression  (16) to start carboplatin/ gemcitabine  05/26/2015, to be given day one and day 8 of each 21 day cycle  (a) given her history of severe neutropenia with this chemotherapy we'll do Neupogen days 23 and 4 of each cycle and onpro day 9  (17) Left pleural effusion_0   (a) s/p L thoracentesis 05/22/2015, 05/29/2015   PLAN:  Kristin Hoffman's platelets are critically low at 10 today. I have consulted with [name] and he agrees that she needs to be replaced today in order to have the pleurx catheter placed this Thursday. She will have repeat labs on Thursday morning before the procedure, to ensure that her platelet count rebounds to above 50. She is significantly more anemic this week, but not low enough to require  blood.   I encouraged Doneshia to apply neosporin to the scabbed areas to the left and right of her eyes. She will also be more compliant with her saline nasal spray, since the oxygen seems to be drying her out. She is going to restart magic mouth wash for her mucositis.   Kristin Hoffman will return in 1 week for cycle 2 of carboplatin and gemcitabine. She understands and agrees with this plan. She knows the goal of treatment in her case is control. She has been encouraged to call with any issues that might arise before her next visit here.   Laurie Panda, NP   06/08/2015 1:56 PM

## 2015-06-08 NOTE — Patient Instructions (Signed)

## 2015-06-09 ENCOUNTER — Telehealth: Payer: Self-pay | Admitting: Oncology

## 2015-06-09 ENCOUNTER — Other Ambulatory Visit: Payer: Self-pay | Admitting: Physician Assistant

## 2015-06-09 NOTE — Telephone Encounter (Signed)
Called and left a message with lab for 6/30

## 2015-06-10 ENCOUNTER — Other Ambulatory Visit: Payer: Self-pay | Admitting: Radiology

## 2015-06-11 ENCOUNTER — Ambulatory Visit (HOSPITAL_COMMUNITY)
Admission: RE | Admit: 2015-06-11 | Discharge: 2015-06-11 | Disposition: A | Discharge status: Home / Self Care | Payer: 59 | Source: Ambulatory Visit | Attending: Oncology | Admitting: Oncology

## 2015-06-11 ENCOUNTER — Ambulatory Visit (HOSPITAL_COMMUNITY): Admission: RE | Admit: 2015-06-11 | Payer: 59 | Source: Ambulatory Visit

## 2015-06-11 ENCOUNTER — Other Ambulatory Visit (HOSPITAL_BASED_OUTPATIENT_CLINIC_OR_DEPARTMENT_OTHER): Payer: 59

## 2015-06-11 ENCOUNTER — Other Ambulatory Visit: Payer: 59

## 2015-06-11 ENCOUNTER — Other Ambulatory Visit: Payer: Self-pay

## 2015-06-11 ENCOUNTER — Ambulatory Visit (HOSPITAL_BASED_OUTPATIENT_CLINIC_OR_DEPARTMENT_OTHER): Payer: 59

## 2015-06-11 ENCOUNTER — Other Ambulatory Visit: Payer: Self-pay | Admitting: Oncology

## 2015-06-11 VITALS — BP 123/72 | HR 103 | Temp 98.4°F | Resp 18

## 2015-06-11 DIAGNOSIS — C50919 Malignant neoplasm of unspecified site of unspecified female breast: Secondary | ICD-10-CM

## 2015-06-11 DIAGNOSIS — C50312 Malignant neoplasm of lower-inner quadrant of left female breast: Secondary | ICD-10-CM

## 2015-06-11 DIAGNOSIS — J90 Pleural effusion, not elsewhere classified: Secondary | ICD-10-CM | POA: Diagnosis present

## 2015-06-11 DIAGNOSIS — D696 Thrombocytopenia, unspecified: Secondary | ICD-10-CM

## 2015-06-11 LAB — CBC WITH DIFFERENTIAL/PLATELET
BASO%: 0 % (ref 0.0–2.0)
BASO%: 0.9 % (ref 0.0–2.0)
BASO%: 0 % (ref 0.0–2.0)
Basophils Absolute: 0 10*3/uL (ref 0.0–0.1)
Basophils Absolute: 0 10*3/uL (ref 0.0–0.1)
Basophils Absolute: 0 10*3/uL (ref 0.0–0.1)
EOS ABS: 0 10*3/uL (ref 0.0–0.5)
EOS%: 0 % (ref 0.0–7.0)
EOS%: 0 % (ref 0.0–7.0)
EOS%: 0 % (ref 0.0–7.0)
Eosinophils Absolute: 0 10*3/uL (ref 0.0–0.5)
Eosinophils Absolute: 0 10*3/uL (ref 0.0–0.5)
HCT: 20.9 % — ABNORMAL LOW (ref 34.8–46.6)
HCT: 20.7 % — ABNORMAL LOW (ref 34.8–46.6)
HEMATOCRIT: 25.7 % — AB (ref 34.8–46.6)
HEMOGLOBIN: 8.4 g/dL — AB (ref 11.6–15.9)
HEMOGLOBIN: 6.7 g/dL — AB (ref 11.6–15.9)
HGB: 6.7 g/dL — CL (ref 11.6–15.9)
LYMPH#: 0.5 10*3/uL — AB (ref 0.9–3.3)
LYMPH%: 55.2 % — ABNORMAL HIGH (ref 14.0–49.7)
LYMPH%: 50.9 % — AB (ref 14.0–49.7)
LYMPH%: 50 % — ABNORMAL HIGH (ref 14.0–49.7)
MCH: 30 pg (ref 25.1–34.0)
MCH: 29.3 pg (ref 25.1–34.0)
MCH: 29.6 pg (ref 25.1–34.0)
MCHC: 32.7 g/dL (ref 31.5–36.0)
MCHC: 32.1 g/dL (ref 31.5–36.0)
MCHC: 32.4 g/dL (ref 31.5–36.0)
MCV: 91.8 fL (ref 79.5–101.0)
MCV: 91.3 fL (ref 79.5–101.0)
MCV: 91.6 fL (ref 79.5–101.0)
MONO#: 0.2 10*3/uL (ref 0.1–0.9)
MONO#: 0.1 10*3/uL (ref 0.1–0.9)
MONO#: 0.1 10*3/uL (ref 0.1–0.9)
MONO%: 8.3 % (ref 0.0–14.0)
MONO%: 12.5 % (ref 0.0–14.0)
MONO%: 9 % (ref 0.0–14.0)
NEUT#: 0.7 10*3/uL — ABNORMAL LOW (ref 1.5–6.5)
NEUT%: 36.5 % — ABNORMAL LOW (ref 38.4–76.8)
NEUT%: 35.7 % — AB (ref 38.4–76.8)
NEUT%: 41 % (ref 38.4–76.8)
NEUTROS ABS: 0.4 10*3/uL — AB (ref 1.5–6.5)
NEUTROS ABS: 0.4 10*3/uL — AB (ref 1.5–6.5)
Platelets: 13 10*3/uL — ABNORMAL LOW (ref 145–400)
Platelets: 32 10*3/uL — ABNORMAL LOW (ref 145–400)
Platelets: 56 10*3/uL — ABNORMAL LOW (ref 145–400)
RBC: 2.8 10*6/uL — AB (ref 3.70–5.45)
RBC: 2.29 10*6/uL — ABNORMAL LOW (ref 3.70–5.45)
RBC: 2.26 10*6/uL — ABNORMAL LOW (ref 3.70–5.45)
RDW: 19.3 % — AB (ref 11.2–14.5)
RDW: 19.5 % — AB (ref 11.2–14.5)
RDW: 19.5 % — AB (ref 11.2–14.5)
WBC: 1.8 10*3/uL — AB (ref 3.9–10.3)
WBC: 1.1 10*3/uL — AB (ref 3.9–10.3)
WBC: 1 10*3/uL — ABNORMAL LOW (ref 3.9–10.3)
lymph#: 1 10*3/uL (ref 0.9–3.3)
lymph#: 0.6 10*3/uL — ABNORMAL LOW (ref 0.9–3.3)
nRBC: 0 % (ref 0–0)
nRBC: 0 % (ref 0–0)

## 2015-06-11 LAB — COMPREHENSIVE METABOLIC PANEL (CC13)
ALK PHOS: 177 U/L — AB (ref 40–150)
ALT: 52 U/L (ref 0–55)
ANION GAP: 9 meq/L (ref 3–11)
AST: 52 U/L — AB (ref 5–34)
Albumin: 2 g/dL — ABNORMAL LOW (ref 3.5–5.0)
BUN: 9.9 mg/dL (ref 7.0–26.0)
CO2: 28 mEq/L (ref 22–29)
Calcium: 9 mg/dL (ref 8.4–10.4)
Chloride: 104 mEq/L (ref 98–109)
Creatinine: 0.6 mg/dL (ref 0.6–1.1)
EGFR: >90 mL/min/{1.73_m2} (ref >90)
GLUCOSE: 80 mg/dL (ref 70–140)
POTASSIUM: 4.7 meq/L (ref 3.5–5.1)
Sodium: 142 mEq/L (ref 136–145)
Total Bilirubin: 1.04 mg/dL (ref 0.20–1.20)
Total Protein: 5.2 g/dL — ABNORMAL LOW (ref 6.4–8.3)

## 2015-06-11 LAB — PREPARE PLATELET PHERESIS: Unit division: 0

## 2015-06-11 LAB — PREPARE RBC (CROSSMATCH)

## 2015-06-11 MED ORDER — HYDROCODONE-ACETAMINOPHEN 5-325 MG PO TABS
1.0000 | ORAL_TABLET | Freq: Once | ORAL | Status: AC
  Administered 2015-06-11: 1 via ORAL

## 2015-06-11 MED ORDER — HEPARIN SOD (PORK) LOCK FLUSH 100 UNIT/ML IV SOLN
500.0000 [IU] | Freq: Once | INTRAVENOUS | Status: AC
  Administered 2015-06-11: 500 [IU]

## 2015-06-11 MED ORDER — HYDROCODONE-ACETAMINOPHEN 5-325 MG PO TABS
ORAL_TABLET | ORAL | Status: AC
  Filled 2015-06-11: qty 1

## 2015-06-11 MED ORDER — HEPARIN SOD (PORK) LOCK FLUSH 100 UNIT/ML IV SOLN
250.0000 [IU] | INTRAVENOUS | Status: DC | PRN
  Filled 2015-06-11: qty 5

## 2015-06-11 MED ORDER — SODIUM CHLORIDE 0.9 % IV SOLN
250.0000 mL | Freq: Once | INTRAVENOUS | Status: AC
  Administered 2015-06-11: 250 mL via INTRAVENOUS

## 2015-06-11 MED ORDER — ACETAMINOPHEN 325 MG PO TABS
ORAL_TABLET | ORAL | Status: AC
  Filled 2015-06-11: qty 2

## 2015-06-11 MED ORDER — HEPARIN SOD (PORK) LOCK FLUSH 100 UNIT/ML IV SOLN
INTRAVENOUS | Status: AC
  Administered 2015-06-11: 500 [IU]
  Filled 2015-06-11: qty 5

## 2015-06-11 MED ORDER — DIPHENHYDRAMINE HCL 25 MG PO CAPS
25.0000 mg | ORAL_CAPSULE | Freq: Once | ORAL | Status: AC
  Administered 2015-06-11: 25 mg via ORAL

## 2015-06-11 MED ORDER — DIPHENHYDRAMINE HCL 25 MG PO CAPS
ORAL_CAPSULE | ORAL | Status: AC
  Filled 2015-06-11: qty 1

## 2015-06-11 MED ORDER — ACETAMINOPHEN 325 MG PO TABS
650.0000 mg | ORAL_TABLET | Freq: Once | ORAL | Status: AC
  Administered 2015-06-11: 650 mg via ORAL

## 2015-06-11 MED ORDER — SODIUM CHLORIDE 0.9 % IJ SOLN
10.0000 mL | INTRAMUSCULAR | Status: DC | PRN
  Filled 2015-06-11: qty 10

## 2015-06-11 NOTE — Patient Instructions (Signed)
Platelet Transfusion Information °This is information about transfusions of platelets. Platelets are tiny cells made by the bone marrow and found in the blood. When a blood vessel is damaged, platelets rush to the damaged area to help form a clot. This begins the healing process. When platelets get very low, your blood may have trouble clotting. This may be from: °· Illness. °· Blood disorder. °· Chemotherapy to treat cancer. °Often, lower platelet counts do not cause problems.  °Platelets usually last for 7 to 10 days. If they are not used in an injury, they are broken down by the liver or spleen. °Symptoms of low platelet count include: °· Nosebleeds. °· Bleeding gums. °· Heavy periods. °· Bruising and tiny blood spots in the skin. °¨ Pinpoint spots of bleeding (petechiae). °¨ Larger bruises (purpura). °· Bleeding can be more serious if it happens in the brain or bowel. °Platelet transfusions are often used to keep the platelet count at an acceptable level. Serious bleeding due to low platelets is uncommon. °RISKS AND COMPLICATIONS °Severe side effects from platelet transfusions are uncommon. Minor reactions may include: °· Itching. °· Rashes. °· High temperature and shivering. °Medications are available to stop transfusion reactions. Let your health care provider know if you develop any of the above problems.  °If you are having platelet transfusions frequently, they may get less effective. This is called becoming refractory to platelets. It is uncommon. This can happen from non-immune causes and immune causes. Non-immune causes include: °· High temperatures. °· Some medications. °· An enlarged spleen. °Immune causes happen when your body discovers the platelets are not your own and begins making antibodies against them. The antibodies kill the platelets quickly. Even with platelet transfusions, you may still notice problems with bleeding or bruising. Let your health care providers know about this. Other things  can be done to help if this happens.  °BEFORE THE PROCEDURE  °· Your health care provider will check your platelet count regularly. °· If the platelet count is too low, it may be necessary to have a platelet transfusion. °· This is more important before certain procedures with a risk of bleeding, such as a spinal tap. °· Platelet transfusion reduces the risk of bleeding during or after the procedure. °· Except in emergencies, giving a transfusion requires a written consent. °Before blood is taken from a donor, a complete history is taken to make sure the person has no history of previous diseases, nor engages in risky social behavior. Examples of this are intravenous drug use or sexual activity with multiple partners. This could lead to infected blood or blood products being used. This history is taken in spite of the extensive testing to make sure the blood is safe. All blood products transfused are tested to make sure it is a match for the person getting the blood. It is also checked for infections. Blood is the safest it has ever been. The risk of getting an infection is very low. °PROCEDURE °· The platelets are stored in small plastic bags that are kept at a low temperature. °· Each bag is called a unit and sometimes two units are given. They are given through an intravenous line by drip infusion over about one-half hour. °· Usually blood is collected from multiple people to get enough to transfuse. °· Sometimes, the platelets are collected from a single person. This is done using a special machine that separates the platelets from the blood. The machine is called an apheresis machine. Platelets collected in this   way are called apheresed platelets. Apheresed platelets reduce the risk of becoming sensitive to the platelets. This lowers the chances of having a transfusion reaction. °· As it only takes a short time to give the platelets, this treatment can be given in an outpatient department. Platelets can also be  given before or after other treatments. °SEEK IMMEDIATE MEDICAL CARE IF: °You have any of the following symptoms over the next 12 hours or several days: °· Shaking chills. °· Fever with a temperature greater than 102°F (38.9°C) develops. °· Back pain or muscle pain. °· People around you feel you are not acting correctly, or you are confused. °· Blood in the urine or bowel movements, or bleeding from any place in your body. °· Shortness of breath, or difficulty breathing. °· Dizziness. °· Fainting. °· You break out in a rash or develop hives. °· Decrease in the amount of urine you are putting out, or the urine turns a dark color or changes to pink, red, or brown. °· A severe headache or stiff neck. °· Bruising more easily. °Document Released: 09/25/2007 Document Revised: 04/14/2014 Document Reviewed: 09/25/2007 °[employer]® Patient Information ©2015 [employer]. This information is not intended to replace advice given to you by your health care provider. Make sure you discuss any questions you have with your health care provider. ° °

## 2015-06-11 NOTE — Progress Notes (Signed)
Patient ID: Kristin Hoffman, female   DOB: 09-27-1966, 49 y.o.   MRN: [id number] Patient was scheduled for a tunneled left pleural catheter in interventional radiology. I discussed the tunneled pleural catheter with the patient and her father in depth. I also evaluated the patient's left chest with ultrasound. The patient has a complex or loculated collection along the anterior left chest which is not amenable to percutaneous drainage. The patient has had previous thoracentesis procedures in the past but these were performed in the posterior left chest. Patient has minimal fluid in this area based on ultrasound today. I reviewed the ultrasound images and previous CT images with the patient. I am concerned that the patient may not tolerate a tunneled pleural catheter because she has had severe pain and discomfort with ultrasound guided thoracentesis procedures in the past. Fortunately, the patient's pleural fluid has not reaccumulated as fast as it has in the past. At this time, we will continue ultrasound-guided thoracentesis procedures as needed. We may need to consider using moderate sedation in the future because the patient has some much discomfort with thoracentesis procedures.

## 2015-06-12 ENCOUNTER — Ambulatory Visit (HOSPITAL_COMMUNITY): Payer: 59

## 2015-06-12 ENCOUNTER — Ambulatory Visit (HOSPITAL_COMMUNITY)
Admission: RE | Admit: 2015-06-12 | Discharge: 2015-06-12 | Disposition: A | Discharge status: Home / Self Care | Payer: 59 | Source: Ambulatory Visit | Attending: Oncology | Admitting: Oncology

## 2015-06-12 DIAGNOSIS — C799 Secondary malignant neoplasm of unspecified site: Secondary | ICD-10-CM | POA: Insufficient documentation

## 2015-06-12 DIAGNOSIS — C50919 Malignant neoplasm of unspecified site of unspecified female breast: Secondary | ICD-10-CM

## 2015-06-12 DIAGNOSIS — C50312 Malignant neoplasm of lower-inner quadrant of left female breast: Secondary | ICD-10-CM | POA: Insufficient documentation

## 2015-06-12 DIAGNOSIS — D696 Thrombocytopenia, unspecified: Secondary | ICD-10-CM | POA: Insufficient documentation

## 2015-06-12 LAB — PREPARE PLATELET PHERESIS
UNIT DIVISION: 0
Unit division: 0
Unit division: 0

## 2015-06-13 ENCOUNTER — Ambulatory Visit (HOSPITAL_BASED_OUTPATIENT_CLINIC_OR_DEPARTMENT_OTHER): Payer: 59

## 2015-06-13 VITALS — BP 113/70 | HR 104 | Temp 98.2°F | Resp 18

## 2015-06-13 DIAGNOSIS — D696 Thrombocytopenia, unspecified: Secondary | ICD-10-CM

## 2015-06-13 DIAGNOSIS — C50312 Malignant neoplasm of lower-inner quadrant of left female breast: Secondary | ICD-10-CM

## 2015-06-13 DIAGNOSIS — C50919 Malignant neoplasm of unspecified site of unspecified female breast: Secondary | ICD-10-CM

## 2015-06-13 DIAGNOSIS — C799 Secondary malignant neoplasm of unspecified site: Secondary | ICD-10-CM | POA: Diagnosis not present

## 2015-06-13 MED ORDER — SODIUM CHLORIDE 0.9 % IJ SOLN
10.0000 mL | INTRAMUSCULAR | Status: AC | PRN
  Administered 2015-06-13: 10 mL
  Filled 2015-06-13: qty 10

## 2015-06-13 MED ORDER — ACETAMINOPHEN 325 MG PO TABS
ORAL_TABLET | ORAL | Status: AC
  Filled 2015-06-13: qty 2

## 2015-06-13 MED ORDER — ACETAMINOPHEN 325 MG PO TABS
650.0000 mg | ORAL_TABLET | Freq: Once | ORAL | Status: AC
  Administered 2015-06-13: 650 mg via ORAL

## 2015-06-13 MED ORDER — HEPARIN SOD (PORK) LOCK FLUSH 100 UNIT/ML IV SOLN
500.0000 [IU] | Freq: Every day | INTRAVENOUS | Status: AC | PRN
  Administered 2015-06-13: 500 [IU]
  Filled 2015-06-13: qty 5

## 2015-06-13 MED ORDER — SODIUM CHLORIDE 0.9 % IV SOLN
250.0000 mL | Freq: Once | INTRAVENOUS | Status: AC
  Administered 2015-06-13: 250 mL via INTRAVENOUS

## 2015-06-13 MED ORDER — DIPHENHYDRAMINE HCL 25 MG PO CAPS
25.0000 mg | ORAL_CAPSULE | Freq: Once | ORAL | Status: AC
  Administered 2015-06-13: 25 mg via ORAL

## 2015-06-13 NOTE — Patient Instructions (Signed)

## 2015-06-15 LAB — TYPE AND SCREEN
ABO/RH(D): A POS
Antibody Screen: NEGATIVE
UNIT DIVISION: 0
Unit division: 0

## 2015-06-15 NOTE — Progress Notes (Signed)
ID: Kristin Hoffman OB: 06/01/1966  MR#: [id number]  CSN#:[id number]  PCP: POLLOCK, NELSON, MD GYN:  James Tomblin SU: Christian Streck OTHER MD: Matthew Manning, E. Claire Dees  CHIEF COMPLAINT:  Stage IV breast cancer  CURRENT TREATMENT: carboplatin/ gemcitabine  BREAST CANCER HISTORY: This patient was previously followed by Dr. Peter Rubin, and was transferred to Dr. Magrinat's service as of 08/20/2013.  At the age of 49, the patient had a screening mammogram as a baseline. She had recently given birth and was on birth control pills at that time. The mammogram in November 2006 showed an area of architectural distortion in the left lower inner quadrant. Subsequently an ultrasound was obtained of the left breast showing a 2.0 x 1.5 x 1.4 cm mass, at the 8:00 position, 4 cm from the nipple. A core biopsy performed on 10/21/2005 showed an invasive mammary carcinoma, ER +70%, PR +41%, HER-2/neu negative, with proliferation marker of 38%. ([id number])  Breast MRI on 11/08/2005 confirmed a 2.5 cm spiculated mass in the left lower inner quadrant with 3 small satellite nodules adjacent to the primary mass: 3.5 cm posterior medial to the primary mass, measuring 9 mm; 1.5 cm anterior medial to the primary mass measuring 5 mm; and 2 cm medial to the primary mass measuring 5 mm. No axillary adenopathy was noted. No suspicious masses or enhancement are noted in the right breast.  [name] underwentt left lumpectomy under the care of Dr. 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 2 of 23 lymph nodes were involved.  Margins were clear.  She received adjuvant chemotherapy consisting of 6 q. three-week doses of docetaxel/doxorubicin/cyclophosphamide given between January 2007 and may of 2007, with last dose on 04/20/2006. She underwent radiation therapy completed 07/11/2006, after which she began on tamoxifen in early August  2007.  She underwent hysterectomy with bilateral salpingo-oophorectomy on 07/15/2008, and was started on letrozole in October of 2009.  A mammogram 11/10/2009 showed microcalcifications in the left breast, and a subsequent biopsy on 11/18/2009 confirmed invasive mammary carcinoma in the left breast. PET and CT of the chest showed no evidence of metastasis in January 2011.  She underwent bilateral mastectomies 01/25/2010, with the right breast showing no evidence of malignancy; in the left breast showing a 1.0 cm grade 2 invasive ductal carcinoma with high-grade DCIS. Tumor was ER +22%, PR negative, HER-2/neu negative, with proliferation marker of 13%. Margins were clear. Patient underwent concurrent left latissimus flap reconstruction and right implant reconstruction.  She received additional chemotherapy with one cycle of carboplatin/gemcitabine given on 03/11/2010. She then received 5 q. three-week cycles of IV CMF between 03/25/2010 and 06/17/2010.  She was started on exemestane in January 2012 and continued until late November 2013 when she was hospitalized for apparent TIA.   Her subsequent history is as detailed below  INTERVAL HISTORY: [name] returns today for follow up of her metastatic breast cancer. Today is day 1 cycle 2 of 3 cycles planned of carboplatin and gemcitabine given on days 1 and 8 of each cycle. She receives Neupogen on days 2 and 3 and 4 of each cycle and then onpro on day 9. --Given her platelet meter of 13,000 (before we transfused) I am dropping her carbo dose to an AUC of 1.5 instead of 2.    REVIEW OF SYSTEMS: Sherry was scheduled for Pleurx placement 06/12/2015, and we transfused her platelets to greater than 50,000 to accomplish   that safely. However the radiologist and interventional felt there was less fluid than expected and since she had had such pain with thoracentesis previously he suggested postponing that procedure and doing it under sedation if it needed to be done  in the future. (There was also a concern that the fluid is loculated and the Pleuryx may not function well). At home she feels rotten, hurts all the time, and is upset because she can't get anything done. She is very tearful about all this. Her pain is not well controlled. She is taking oxycodone/APAP every 4 hours round the clock. In the morning is very hard for her to get out of bed. She is not constipated from the narcotics however. She continues to have a little bit of I stickiness in the morning and her eyelids feel rough. There have been no unusual headaches no other visual changes, no nausea or vomiting, no cough or phlegm production though she can become anxious and then particularly short of breath. She hasn't felt like she couldn't handle the shortness of breath however. A detailed review of systems was otherwise stable  PAST MEDICaL HISTORY: Past Medical History  Diagnosis Date  . Depression   . Reflux   . Hx-TIA (transient ischemic attack) 11/22/2013  . Chronic headaches 11/22/2013  . Anxiety 11/22/2013  . Breast cancer   . Cancer   . Breast cancer metastasized to multiple sites 12/24/2013    PAST SURGICAL HISTORY: Past Surgical History  Procedure Laterality Date  . Mastectomy w/ nodes partial    . Mastectomy    . Breast reconstruction    . Portacath placement      x 2  . Portacath removal      x 2  . Abdominal hysterectomy    . Tee without cardioversion  11/12/2012    Procedure: TRANSESOPHAGEAL ECHOCARDIOGRAM (TEE);  Surgeon: Candee Furbish, MD;  Location: [facility] ENDOSCOPY;  Service: Cardiovascular;  Laterality: N/A;  This TEE may be Dr. Marlou Porch or a LHC Dr    FAMILY HISTORY Both parents are alive and well. Patient has one sister who is 69 years younger and is in good health. No other history of breast or ovarian cancer in the family. Family History  Problem Relation Age of Onset  . Diabetes Mellitus II Neg Hx   . Hypertension Neg Hx     GYNECOLOGIC HISTORY:   (Updated  January 2015) G2P2, menarche at age 49 with irregular menses. On birth control pills in the past. On Clomid to induce ovulation with first pregnancy. Also had preeclampsia with first pregnancy. Status post hysterectomy and bilateral salpingo-oophorectomy in August 2009.  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 She is originally from [place]. She's been married to [place], for 13 years. He works as an Pharmacist, hospital at [employer].   ADVANCED DIRECTIVES:  Not in place; Not in place. At the clinic visit 06/16/2015 the patient was given the appropriate forms to complete and notarize at her discretion.    HEALTH MAINTENANCE:  (Updated 11/22/2013) History  Substance Use Topics  . Smoking status: Never Smoker   . Smokeless tobacco: Never Used  . Alcohol Use: Yes     Comment: rarely     Colonoscopy:  Never  PAP: S/P LAVH/BSO in August 2009  Bone density: Never  Lipid panel: Not on file   Allergies  Allergen Reactions  . Afinitor [Everolimus] Hives  and Itching    Current Outpatient Prescriptions  Medication Sig Dispense Refill  . acidophilus (RISAQUAD) CAPS capsule Take 1 capsule by mouth every morning.     . aspirin EC 81 MG tablet Take 81 mg by mouth daily with breakfast.    . Biotin 5000 MCG CAPS Take 5,000 mcg by mouth every morning.  30 capsule   . buPROPion (WELLBUTRIN XL) 150 MG 24 hr tablet Take 1 tablet (150 mg total) by mouth daily. 30 tablet 12  . cetirizine (ZYRTEC) 10 MG tablet Take 10 mg by mouth at bedtime.     . Cholecalciferol 2000 UNITS CHEW Chew 1 tablet by mouth daily with breakfast.     . docusate sodium (COLACE) 100 MG capsule Take 1 capsule (100 mg total) by mouth 2 (two) times daily. (Patient not taking: Reported on 06/08/2015) 20 capsule 0  . escitalopram (LEXAPRO) 20 MG tablet Take 1 tablet (20 mg total) by mouth daily. 30 tablet 12  . filgrastim (NEUPOGEN) 300  MCG/0.5ML SOSY injection Inject 0.5 mLs (300 mcg total) into the skin once. 6 Syringe 1  . Ibuprofen (ADVIL) 200 MG CAPS Take 2 capsules (400 mg total) by mouth 3 (three) times daily with meals as needed. (Patient taking differently: Take 400 mg by mouth every 6 (six) hours as needed (for pain). ) 120 each 0  . LORazepam (ATIVAN) 0.5 MG tablet TAKE 1 TABLET BY MOUTH AS NEEDED FOR NAUSEA AND VOMITING (Patient taking differently: TAKE 1 TABLET BY MOUTH every night at bedtime) 30 tablet 0  . Melatonin 10 MG TABS Take 1 tablet by mouth at bedtime.    . montelukast (SINGULAIR) 10 MG tablet Take 10 mg by mouth daily with breakfast.     . Multiple Vitamin (MULTIVITAMIN) tablet Take 1 tablet by mouth every morning.     . nystatin (MYCOSTATIN) 100000 UNIT/ML suspension Take 5 mLs (500,000 Units total) by mouth 4 (four) times daily. (Patient not taking: Reported on 06/08/2015) 240 mL 1  . omeprazole (PRILOSEC) 40 MG capsule Take 40 mg by mouth every morning.     . ondansetron (ZOFRAN) 8 MG tablet Take 8 mg by mouth 2 (two) times daily. Starts day after chemo    . OxyCODONE (OXYCONTIN) 20 mg T12A 12 hr tablet Take 2 tablets (40 mg total) by mouth every 12 (twelve) hours. 60 tablet 0  . oxyCODONE-acetaminophen (PERCOCET/ROXICET) 5-325 MG per tablet Take 1 tablet by mouth every 4 (four) hours as needed for moderate pain. 30 tablet 0  . polyethylene glycol (MIRALAX / GLYCOLAX) packet Take 17 g by mouth daily. (Patient not taking: Reported on 06/08/2015) 14 each 0  . prochlorperazine (COMPAZINE) 10 MG tablet Take 10 mg by mouth every 6 (six) hours as needed for nausea or vomiting.     . tobramycin-dexamethasone (TOBRADEX) ophthalmic solution Place 1 drop into both eyes 2 (two) times daily as needed. 5 mL 0  . traMADol (ULTRAM) 50 MG tablet Take 1 tablet (50 mg total) by mouth every 6 (six) hours as needed. (Patient taking differently: Take 50 mg by mouth every 6 (six) hours as needed for moderate pain. ) 30 tablet 3    No current facility-administered medications for this visit.   Facility-Administered Medications Ordered in Other Visits  Medication Dose Route Frequency Provider Last Rate Last Dose  . sodium chloride 0.9 % injection 10 mL  10 mL Intracatheter PRN Gustav C Magrinat, MD   10 mL at 06/16/15 1222    OBJECTIVE: Young   white woman who appears stated age  Filed Vitals:   06/16/15 1253  BP: 111/69  Pulse: 97  Temp: 99.2 F (37.3 C)  Resp: 18  Body mass index is 30.44 kg/(m^2).  ECOG: 2 Filed Weights   06/16/15 1253  Weight: 171 lb 12.8 oz (77.928 kg)     Sclerae unicteric, pupils round and equal, eyelids minimally irritated, no discharge Oropharynx clear and moist-- no thrush or other lesions No cervical or supraclavicular adenopathy Lungs dullness at both bases as previously noted Heart regular rate and rhythm Abd soft, nontender, positive bowel sounds MSK no focal spinal tenderness, no upper extremity lymphedema Neuro: nonfocal, well oriented, appropriate affect Breasts: Deferred    LAB RESULTS:   Lab Results  Component Value Date   WBC 10.7* 06/16/2015   NEUTROABS 7.5* 06/16/2015   HGB 11.9 06/16/2015   HCT 35.6 06/16/2015   MCV 90.5 06/16/2015   PLT 460* 06/16/2015      Chemistry      Component Value Date/Time   NA 141 06/16/2015 0914   NA 138 05/30/2015 0525   K 3.5 06/16/2015 0914   K 3.7 05/30/2015 0525   CL 104 05/30/2015 0525   CL 104 11/14/2012 1407   CO2 28 06/16/2015 0914   CO2 27 05/30/2015 0525   BUN 9.8 06/16/2015 0914   BUN 16 05/30/2015 0525   CREATININE 0.6 06/16/2015 0914   CREATININE 0.68 05/30/2015 0525      Component Value Date/Time   CALCIUM 9.2 06/16/2015 0914   CALCIUM 8.2* 05/30/2015 0525   ALKPHOS 229* 06/16/2015 0914   ALKPHOS 88 05/30/2015 0525   AST 62* 06/16/2015 0914   AST 193* 05/30/2015 0525   ALT 56* 06/16/2015 0914   ALT 76* 05/30/2015 0525   BILITOT 1.00 06/16/2015 0914   BILITOT 1.0 05/30/2015 0525       STUDIES: Dg Chest 1 View  06/05/2015   CLINICAL DATA:  Status post left thoracentesis  EXAM: CHEST  1 VIEW  COMPARISON:  05/29/2015  FINDINGS: Cardiac shadow is within normal limits. The right chest wall port is again seen. Right basilar mass is again seen and stable. There is been interval left-sided thoracentesis with decrease in the appearance of the left-sided pleural effusion. No pneumothorax is noted. There remains a loculated component superiorly.  IMPRESSION: No pneumothorax following thoracentesis on the left. Persistent right basilar mass lesion and loculated component of pleural fluid on the left is noted.   Electronically Signed   By: Mark  Lukens M.D.   On: 06/05/2015 15:16   Dg Chest 1 View  05/29/2015   CLINICAL DATA:  Post LEFT thoracentesis, metastatic breast cancer  EXAM: CHEST  1 VIEW  COMPARISON:  Repeat exam 1633 hours compared to 1537 hours  FINDINGS: RIGHT jugular Port-A-Cath stable tip projecting over SVC.  Decrease in LEFT pleural effusion post thoracentesis.  No pneumothorax.  Persistent atelectasis infiltrate at LEFT base.  Large RIGHT basilar metastasis and scattered interstitial infiltrates in the mid lower RIGHT lung unchanged.  Thoracolumbar scoliosis and slight osseous demineralization.  IMPRESSION: No pneumothorax following LEFT thoracentesis.   Electronically Signed   By: Mark  Boles M.D.   On: 05/29/2015 16:52   Dg Chest 1 View  05/22/2015   CLINICAL DATA:  Post left-sided thoracentesis. History of breast cancer  EXAM: CHEST  1 VIEW  COMPARISON:  CT the chest, abdomen and pelvis - earlier same day  FINDINGS: Grossly unchanged cardiac silhouette and mediastinal contours with partial obscuration of the   right heart border secondary to known dominant metastasis within the anterior aspect the right upper lobe. Stable position of support apparatus. Interval reduction in persistent small partially loculated sided effusion post thoracentesis. No pneumothorax. There is  prominence of the left pulmonary hila compatible with the known left-sided anterior mediastinal adenopathy. Surgical clips overlie the left axilla the superior aspect the left mid hemi abdomen. No acute osseus abnormalities.  IMPRESSION: 1. Interval reduction in persistent small loculated left-sided effusion post thoracentesis. No pneumothorax. 2. Findings compatible with extensive metastatic disease to the chest. Please refer to contrast-enhanced chest CT performed earlier same day.   Electronically Signed   By: John  Watts M.D.   On: 05/22/2015 16:59   Ct Soft Tissue Neck W Contrast  05/22/2015   CLINICAL DATA:  48-year-old female with metastatic breast cancer. Palpable abnormality on the left neck. Subsequent encounter.  EXAM: CT NECK WITH CONTRAST  TECHNIQUE: Multidetector CT imaging of the neck was performed using the standard protocol following the bolus administration of intravenous contrast.  CONTRAST:  100mL OMNIPAQUE IOHEXOL 300 MG/ML SOLN in conjunction with contrast enhanced imaging of the chest, abdomen, and pelvis reported separately.  COMPARISON:  PET-CT 12/08/2014. Head CT without contrast 11/08/2012.  FINDINGS: Pharynx and larynx: No laryngeal or pharyngeal mass. Negative parapharyngeal spaces. Negative retropharyngeal space. Negative sublingual space.  Salivary glands: Submandibular and parotid glands are within normal limits.  Thyroid: Negative.  Lymph nodes:  Palpable area of concern marked along the anterior base of the left neck. Underlying large 34 x 35 x 30 mm (AP by transverse by CC) heterogeneously enhancing soft tissue mass most resembles lymph nodal metastasis (left level 4) with extracapsular extension of disease. This nodal mass abuts the right subclavian neurovascular bundle. The nearby a left IJ and subclavian vein confluence appears to remain patent, but otherwise there is little left subclavian vein intravascular contrast.  There is a small calcified spiculated soft tissue nodule  located just posterior to the left thyroid lobe which probably reflects previously treated nodal disease (series 7, image 59).  Other cervical lymph node stations are within normal limits.  However, there is abundant abnormal superior mediastinal and right peritracheal lymphadenopathy, see chest CT reported separately.  Vascular: Right side porta cath in place. Internal jugular veins are patent. Major arterial structures in the neck and at the skullbase are patent.  Limited intracranial: Negative.  Visualized orbits: Negative.  Mastoids and visualized paranasal sinuses: Visualized paranasal sinuses and mastoids are clear.  Skeleton: No acute or suspicious osseous lesion in the neck.  Upper chest: Abnormal, reported separately.  IMPRESSION: 1. Left anterior lower neck palpable abnormality corresponds to left level IV metastatic nodal disease with extra-capsular extension. Conglomerate nodal disease here measures up to 35 mm. 2. No other metastatic disease identified in the neck. 3. Abnormal chest and mediastinum, reported separately.   Electronically Signed   By: H  Hall M.D.   On: 05/22/2015 15:28   Ct Chest W Contrast  05/22/2015   CLINICAL DATA:  Subsequent encounter for left breast cancer status post bilateral mastectomy with liver mets.  EXAM: CT CHEST, ABDOMEN, AND PELVIS WITH CONTRAST  TECHNIQUE: Multidetector CT imaging of the chest, abdomen and pelvis was performed following the standard protocol during bolus administration of intravenous contrast.  CONTRAST:  100mL OMNIPAQUE IOHEXOL 300 MG/ML  SOLN  COMPARISON:  Comparison is made to a report for a CT scan at [facility] dated 03/23/2015.  FINDINGS: CT CHEST FINDINGS  Mediastinum/Nodes: Tip of right-sided Port-A-Cath   is in the right atrium. Patient is status post bilateral mastectomy and reconstruction.  Substantial disease progression noted in the chest since 12/28/ 2015. 3.4 x 2.6 cm left supraclavicular lymph node not mentioned previously. 2.6 x  2.5 cm lymph node in the superior left mediastinum mentioned on the previous report presumably represents the 3.8 x 4.3 cm necrotic lymph node seen in the prevascular space today. 2.6 cm necrotic high right paratracheal lymph node not described previously.  5.6 x 8.2 cm necrotic pleural-based lesion in the anterior upper left hemi thorax is apparently new in the interval. Small left pleural effusion described previously with moderate to large left pleural effusion evident today. Multiple left-sided pleural nodules are evident. Disease in the left hemi thorax displaces cardiomediastinal structures to the right.  Lungs/Pleura: 3.3 cm right middle lobe lung mass is contiguous with the anterior pleura. Fine detail of the lungs is obscured by breathing motion. Multiple bilateral pulmonary nodules measuring in the 5-10 mm size range are evident. There is left upper and lower lobe collapse/ consolidation.  Musculoskeletal: Bone windows reveal no worrisome lytic or sclerotic osseous lesions.  CT ABDOMEN AND PELVIS FINDINGS  Hepatobiliary: Heterogeneously enhancing mass in the dome of the liver measures 6.9 x 7.6 cm. Previous study reported a hepatic dome lesion measuring 6.5 x 5.9 cm. 2.7 cm lesion is identified in the caudate lobe. Gallbladder is decompressed. No intrahepatic or extrahepatic biliary dilation.  Pancreas: 3.2 x 4.3 cm mass in the head of the pancreas results and mild distention of the main pancreatic duct. A pancreatic mass was described in the previous report, measuring 3.3 x 2.9 cm at that time.  Spleen: No splenomegaly. No focal mass lesion.  Adrenals/Urinary Tract: No adrenal nodule or mass. Tiny cyst noted in the left kidney. No hydronephrosis. No hydroureter. Urinary bladder is unremarkable.  Stomach/Bowel: Stomach is nondistended. No gastric wall thickening. No evidence of outlet obstruction. Duodenum is normally positioned as is the ligament of Treitz. No small bowel wall thickening. No small bowel  dilatation. Terminal ileum normal. Appendix normal. No gross colonic mass. No colonic wall thickening. No substantial diverticular change.  Vascular/Lymphatic: No abdominal aortic aneurysm. 2.8 cm necrotic portal caval lymph node is identified. Portal vein is markedly attenuated as it passes between the pancreatic head lesion and the portal caval lymph node, but the portal vein does appear to remain patent. Splenic vein and superior mesenteric vein are patent. No para-aortic lymphadenopathy. No pelvic sidewall lymphadenopathy.  Reproductive: Uterus is surgically absent.  No adnexal mass.  Other: No intraperitoneal free fluid.  Musculoskeletal: Bone windows reveal no worrisome lytic or sclerotic osseous lesions.  IMPRESSION: Comparing to the report of a study from 03/23/2015, there has been marked progression of metastatic disease in the chest and abdomen. This involves left supraclavicular, mediastinal, pleural , lung parenchymal, liver, pancreatic, and hepatoduodenal ligament nodal metastases.  Moderate to large left pleural effusion is associated with left upper and lower lobe collapse/ consolidation.  Compression of the portal vein between the pancreatic head mass and the hepatoduodenal ligament lymphadenopathy.   Electronically Signed   By: Misty Stanley M.D.   On: 05/22/2015 15:40   Ct Angio Chest Pe W/cm &/or Wo Cm  05/29/2015   CLINICAL DATA:  Worsening shortness of breath for 1.5 weeks. Now with chest pain on breathing.  EXAM: CT ANGIOGRAPHY CHEST WITH CONTRAST  TECHNIQUE: Multidetector CT imaging of the chest was performed using the standard protocol during bolus administration of intravenous contrast. Multiplanar CT image  reconstructions and MIPs were obtained to evaluate the vascular anatomy.  CONTRAST:  80mL OMNIPAQUE IOHEXOL 350 MG/ML SOLN  COMPARISON:  05/22/2015  FINDINGS: Technically adequate study with good opacification of the central and segmental pulmonary arteries. No focal filling defects  demonstrated. No evidence of significant pulmonary embolus.  Mild cardiac enlargement. Normal caliber thoracic aorta. No aortic dissection or aneurysm. Great vessel origins are patent. Aberrant right subclavian artery. Calcified lymph nodes in the mediastinum could represent old granulomatous infection or treated tumor. Enlarged lymph node in the left supraclavicular region measuring 3.6 cm. Enlarged right paratracheal lymph node measuring 2.4 cm. These are similar in size to prior study and consistent with metastasis. Large bilateral pleural masses, measuring 11.8 x 6.1 cm on the left and 3.4 cm on the right. Small bilateral pleural effusions. Basilar atelectasis. 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 Appearances are similar to prior study. Multiple metastases in the liver with heterogeneous parenchymal enhancement pattern corresponding to known portal venous compression. Portal veins are not entirely included within the study for comparison. Bilateral breast implants. No destructive bone lesions are appreciated.  Review of the MIP images confirms the above findings.  IMPRESSION: Diffuse metastatic disease in the chest involving left clavicular and right paratracheal lymph nodes, extensive pleural metastases bilaterally, bilateral pleural effusions, interstitial and nodular parenchymal metastases, and liver metastases. Appearances are similar to previous study. No evidence of significant pulmonary embolus.   Electronically Signed   By: William  Stevens M.D.   On: 05/29/2015 23:17   Ct Abdomen Pelvis W Contrast  05/22/2015   CLINICAL DATA:  Subsequent encounter for left breast cancer status post bilateral mastectomy with liver mets.  EXAM: CT CHEST, ABDOMEN, AND PELVIS WITH CONTRAST  TECHNIQUE: Multidetector CT imaging of the chest, abdomen and pelvis was performed following the standard protocol during bolus administration of intravenous  contrast.  CONTRAST:  100mL OMNIPAQUE IOHEXOL 300 MG/ML  SOLN  COMPARISON:  Comparison is made to a report for a CT scan at [facility] dated 03/23/2015.  FINDINGS: CT CHEST FINDINGS  Mediastinum/Nodes: Tip of right-sided Port-A-Cath is in the right atrium. Patient is status post bilateral mastectomy and reconstruction.  Substantial disease progression noted in the chest since 12/28/ 2015. 3.4 x 2.6 cm left supraclavicular lymph node not mentioned previously. 2.6 x 2.5 cm lymph node in the superior left mediastinum mentioned on the previous report presumably represents the 3.8 x 4.3 cm necrotic lymph node seen in the prevascular space today. 2.6 cm necrotic high right paratracheal lymph node not described previously.  5.6 x 8.2 cm necrotic pleural-based lesion in the anterior upper left hemi thorax is apparently new in the interval. Small left pleural effusion described previously with moderate to large left pleural effusion evident today. Multiple left-sided pleural nodules are evident. Disease in the left hemi thorax displaces cardiomediastinal structures to the right.  Lungs/Pleura: 3.3 cm right middle lobe lung mass is contiguous with the anterior pleura. Fine detail of the lungs is obscured by breathing motion. Multiple bilateral pulmonary nodules measuring in the 5-10 mm size range are evident. There is left upper and lower lobe collapse/ consolidation.  Musculoskeletal: Bone windows reveal no worrisome lytic or sclerotic osseous lesions.  CT ABDOMEN AND PELVIS FINDINGS  Hepatobiliary: Heterogeneously enhancing mass in the dome of the liver measures 6.9 x 7.6 cm. Previous study reported a hepatic dome lesion measuring 6.5 x 5.9 cm. 2.7 cm lesion is identified in the caudate lobe. Gallbladder is   decompressed. No intrahepatic or extrahepatic biliary dilation.  Pancreas: 3.2 x 4.3 cm mass in the head of the pancreas results and mild distention of the main pancreatic duct. A pancreatic mass was described in the  previous report, measuring 3.3 x 2.9 cm at that time.  Spleen: No splenomegaly. No focal mass lesion.  Adrenals/Urinary Tract: No adrenal nodule or mass. Tiny cyst noted in the left kidney. No hydronephrosis. No hydroureter. Urinary bladder is unremarkable.  Stomach/Bowel: Stomach is nondistended. No gastric wall thickening. No evidence of outlet obstruction. Duodenum is normally positioned as is the ligament of Treitz. No small bowel wall thickening. No small bowel dilatation. Terminal ileum normal. Appendix normal. No gross colonic mass. No colonic wall thickening. No substantial diverticular change.  Vascular/Lymphatic: No abdominal aortic aneurysm. 2.8 cm necrotic portal caval lymph node is identified. Portal vein is markedly attenuated as it passes between the pancreatic head lesion and the portal caval lymph node, but the portal vein does appear to remain patent. Splenic vein and superior mesenteric vein are patent. No para-aortic lymphadenopathy. No pelvic sidewall lymphadenopathy.  Reproductive: Uterus is surgically absent.  No adnexal mass.  Other: No intraperitoneal free fluid.  Musculoskeletal: Bone windows reveal no worrisome lytic or sclerotic osseous lesions.  IMPRESSION: Comparing to the report of a study from 03/23/2015, there has been marked progression of metastatic disease in the chest and abdomen. This involves left supraclavicular, mediastinal, pleural , lung parenchymal, liver, pancreatic, and hepatoduodenal ligament nodal metastases.  Moderate to large left pleural effusion is associated with left upper and lower lobe collapse/ consolidation.  Compression of the portal vein between the pancreatic head mass and the hepatoduodenal ligament lymphadenopathy.   Electronically Signed   By: Eric  Mansell M.D.   On: 05/22/2015 15:40   Dg Chest Port 1 View  05/29/2015   CLINICAL DATA:  Shortness of breath. Chest pain for the past week, worsening. Recent thoracentesis. Ongoing chemotherapy for breast  cancer.  EXAM: PORTABLE CHEST - 1 VIEW  COMPARISON:  05/22/2015  FINDINGS: Power injectable right internal jugular Port-A-Cath tip: Cavoatrial junction.  Right middle lobe lung mass. Indistinct pulmonary vasculature. Abnormal bilateral interstitial accentuation. Layering large left pleural effusion, mostly lateral. Indistinct left cardiac border. No pneumothorax.  IMPRESSION: 1. Large left pleural effusion with known large pleural mass on the left. 2. Right middle lobe lung mass. 3. Interstitial pulmonary edema.   Electronically Signed   By: Walter  Liebkemann M.D.   On: 05/29/2015 15:48   Ir Patient Eval Tech 0-60 Mins  06/12/2015   Patient Information   Patient Name Sex DOB SSN  Radabaugh, Onita S Female 05/29/1966 [id number]  Progress Notes by Adam Henn, MD at 06/11/2015 5:26 PM   Author: Adam Henn, MD Service: Radiology Author Type: Physician  Filed: 06/11/2015 5:35 PM Note Time: 06/11/2015 5:26 PM Status: Signed  Editor: Adam Henn, MD (Physician)    Expand All Collapse All    Patient ID: Jaeleah S Shutter, female DOB: 04/29/1966, 49 y.o. MRN: [id number] Patient was scheduled for a tunneled left pleural catheter in interventional radiology. I discussed the tunneled pleural catheter with the patient and her father in depth. I also evaluated the patient's left chest with ultrasound. The patient has a complex or loculated collection along the anterior left chest which is not amenable to percutaneous drainage. The patient has had previous thoracentesis procedures in the past but these were performed in the posterior left chest. Patient has minimal fluid in this area based on ultrasound today. I   reviewed the ultrasound images and previous CT images with the patient. I am concerned that the patient may not tolerate a tunneled pleural catheter because she has had severe pain and discomfort with ultrasound guided thoracentesis procedures in the past. Fortunately, the patient's pleural fluid has not reaccumulated as  fast as it has in the past. At this time, we will continue ultrasound-guided thoracentesis procedures as needed. We may need to consider using moderate sedation in the future because the patient has some much discomfort with thoracentesis procedures.       US Thoracentesis Asp Pleural Space W/img Guide  06/05/2015   INDICATION: Symptomatic recurrent left sided pleural effusion  EXAM: US THORACENTESIS ASP PLEURAL SPACE W/IMG GUIDE  COMPARISON:  05/29/15 thoracentesis.  MEDICATIONS: None  COMPLICATIONS: None immediate  TECHNIQUE: Informed written consent was obtained from the patient after a discussion of the risks, benefits and alternatives to treatment. A timeout was performed prior to the initiation of the procedure.  Initial ultrasound scanning demonstrates a left pleural effusion. The lower chest was prepped and draped in the usual sterile fashion. 1% lidocaine was used for local anesthesia.  Under direct ultrasound guidance, a 19 gauge, 7-cm, Yueh catheter was introduced. An ultrasound image was saved for documentation purposes. The thoracentesis was performed. The catheter was removed and a dressing was applied. The patient tolerated the procedure well without immediate post procedural complication. The patient was escorted to have an upright chest radiograph.  FINDINGS: A total of approximately 1.2 liters of amber colored fluid was removed.  IMPRESSION: Successful ultrasound-guided left sided thoracentesis yielding 1.2 liters of pleural fluid.  Read By:  Tsosie Billing PA-C   Electronically Signed   By: Sandi Mariscal M.D.   On: 06/05/2015 15:35   US Thoracentesis Asp Pleural Space W/img Guide  05/29/2015   INDICATION: Symptomatic recurrent left sided pleural effusion  EXAM: US THORACENTESIS ASP PLEURAL SPACE W/IMG GUIDE  COMPARISON:  CXR 05/29/15.  MEDICATIONS: None  COMPLICATIONS: None immediate  TECHNIQUE: Informed written consent was obtained from the patient after a discussion of the risks, benefits and  alternatives to treatment. A timeout was performed prior to the initiation of the procedure.  Initial ultrasound scanning demonstrates a left pleural effusion. The lower chest was prepped and draped in the usual sterile fashion. 1% lidocaine was used for local anesthesia.  Under direct ultrasound guidance, a 19 gauge, 7-cm, Yueh catheter was introduced. An ultrasound image was saved for documentation purposes. The thoracentesis was performed. The catheter was removed and a dressing was applied. The patient tolerated the procedure well without immediate post procedural complication. The patient was escorted to have an upright chest radiograph.  FINDINGS: A total of approximately 1.2 liters of serous/amber fluid was removed.  IMPRESSION: Successful ultrasound-guided left sided thoracentesis yielding 1.2 liters of pleural fluid.  Read By:  Tsosie Billing PA-C   Electronically Signed   By: Corrie Mckusick D.O.   On: 05/29/2015 16:38   US Thoracentesis Asp Pleural Space W/img Guide  05/22/2015   INDICATION: Breast cancer, dyspnea, left pleural effusion. Request is made for therapeutic left thoracentesis.  EXAM: ULTRASOUND GUIDED THERAPEUTIC LEFT THORACENTESIS  COMPARISON:  None.  MEDICATIONS: None  COMPLICATIONS: None immediate  TECHNIQUE: Informed written consent was obtained from the patient after a discussion of the risks, benefits and alternatives to treatment. A timeout was performed prior to the initiation of the procedure.  Initial ultrasound scanning demonstrates a moderate to large left pleural effusion. The lower chest was prepped and  draped in the usual sterile fashion. 1% lidocaine was used for local anesthesia.  An ultrasound image was saved for documentation purposes. A 6 Fr Safe-T-Centesis catheter was introduced. The thoracentesis was performed. The catheter was removed and a dressing was applied. The patient did experience a mild vasovagal reaction post procedure which responded well to supine  positioning and cool washcloth to the forehead. Patient was escorted to have an upright chest radiograph.  FINDINGS: A total of approximately 1.5 liters of amber/blood-tinged fluid was removed.  IMPRESSION: Successful ultrasound-guided therapeutic left sided thoracentesis yielding 1.5 liters of pleural fluid. Patient with mild vasovagal response post procedure which responded well to supine positioning and cool compress to the forehead. Vital signs were stable upon discharge.  Read by: Kevin Allred, PA-C   Electronically Signed   By: Glenn  Yamagata M.D.   On: 05/22/2015 17:29     ASSESSMENT: 49 y.o. [place] woman  (1)  status post left lumpectomy under the care of Dr. 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 2 of 23 lymph nodes were involved.  Margins were clear.  (2) status post adjuvant chemotherapy consisting of 6 q. three-week doses of docetaxel/ doxorubicin/ cyclophosphamide given between January 2007 and may of 2007, with last dose on 04/20/2006.  (3) status post radiation therapy to the left breast, completed 07/11/2006  (4) began on tamoxifen in early August 2007 and continued until October 2009. She status post hysterectomy with bilateral salpingo-oophorectomy on 07/15/2008, and was started on letrozole in October of 2009.  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 PET and CT of the chest showed no evidence of metastasis in January 2011.  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 Margins were clear. Patient underwent concurrent left latissimus flap  reconstruction and right implant reconstruction.  (7)  status post additional chemotherapy with one cycle of carboplatin/gemcitabine given on 03/11/2010. She then received 5 q. three-week cycles of IV CMF between 03/25/2010 and 06/17/2010.  (8) started on exemestane in January 2012 and continued until late November 2013 when she was hospitalized for apparent TIA at which time her exemestane was stopped and not resumed  (9) METASTATIC DISEASE: evidence of disease recurence noted on chest CT, liver MRI and PET scan in December 2014, with suspicious lung nodules, lymphadenopathy, and 3 lesions in the right lobe of the liver, but no evidence of bony disease and no evidence of brain involvement by brain MRI September 2014. Biopsy of a left supraclavicular lymph node on 12/11/2013 confirmed metastatic carcinoma, estrogen receptor 45% positive, progesterone receptor 17% positive, with no HER-2 amplification.  (10) fulvestrant started 12/03/2013, discontinued 03/25/2014 with progression  (11) exemestane/ everolimus started 04/04/2014; everolimus held 04/14/2014, with skin rash and mouth soreness; resumed at 5 mg a day as of 04/29/2014, discontinued 05/09/2014 with similar symptoms  (12) continueing exemestane, adding palbociclib 05/26/2014;   (a) palbociclib dose dropped to 75 mg every other day for 21 days beginning with cycle 4  (b) exemestane and Palbociclib discontinued December 2015 with evidence of progression  (13) [facility] referral placed 04/28/2049 -- re-sent 05/23/2014-- shows Pi3KCA and TP53 mutations  (a) [facility] one   test requested 12/10/2014-- confirms [facility] results  (b) did not qualify for capecitabine/ BYL 719 trial because of elevated lipase  (14) started eribulin 12/26/2014, stopped 01/23/2015 after 2 cycles because of neuropathy; repeat liver MRI 02/25/2015 shows progression   (15) capecitabine started 03/23/2015, initially 2 weeks 1 week off, then every other week starting with  cycle 2.   (a) dpse dropped from 2g BID to 1.5g BID starting on 04/26/15  (b) stopped 05/17/2015 (after 4 cycles) with progression  (16) started carboplatin/ gemcitabine 05/26/2015, to be given day one and day 8 of each 21 day cycle  (a) given her history of severe neutropenia with this chemotherapy we'll do Neupogen days 2, 3 and 4 of each cycle and onpro day 9  (b) carbo dose dropped by 25% w cycle 2 due to very low platelet nadir  (17) Left pleural effusion_0   (a) s/p L thoracentesis 05/22/2015, 05/29/2015 and 06/05/2015   PLAN:  Bracha's  Pain is not well controlled on her current dose of oxycodone , which adds up to 30 mg daily. We discussed this at length. She is going to start OxyContin 20 mg twice daily, which will be 40 mg daily. She will continue to use the oxycodone APAP on a purely as needed basis. She will keep tabs of how many tablets she takes so can further adjust her pain medication as needed.   I asked her to make sure that her bowel movements are not hard and that they are regular. She has MiraLAX and stool softeners available although she has preferred not to take them so far.   she continues depressed and "crying all the time". She would like to be able to control her moods better. We can't go up on the Lexapro but I did add Wellbutrin 150 mg to take in addition. The patient and she does with that we will consider increasing the dose when she sees me next..  I also added an order for tobradex to see if we can clear the palpebral gland irritation she has been experiencing  We are going to try to obtain clearance for her to give herself the Neupogen shots at home to save her the trips here days to 3 and 4.  Otherwise she will return next week for her day 8 cycle 2 treatment and then the following week as a catch up visit. She will see me with a day 1 of the third cycle and I will set her up for restaging studies at the end of that cycle. However at this point I am moderately  encouraged by the drop in her transaminases.    Chauncey Cruel, MD   06/16/2015 3:37 PM

## 2015-06-16 ENCOUNTER — Ambulatory Visit (HOSPITAL_BASED_OUTPATIENT_CLINIC_OR_DEPARTMENT_OTHER): Payer: 59 | Admitting: Oncology

## 2015-06-16 ENCOUNTER — Other Ambulatory Visit: Payer: 59

## 2015-06-16 ENCOUNTER — Other Ambulatory Visit (HOSPITAL_BASED_OUTPATIENT_CLINIC_OR_DEPARTMENT_OTHER): Payer: 59

## 2015-06-16 ENCOUNTER — Other Ambulatory Visit: Payer: Self-pay | Admitting: *Deleted

## 2015-06-16 ENCOUNTER — Ambulatory Visit (HOSPITAL_BASED_OUTPATIENT_CLINIC_OR_DEPARTMENT_OTHER): Payer: 59

## 2015-06-16 VITALS — BP 119/84 | HR 125 | Temp 99.6°F

## 2015-06-16 VITALS — BP 111/69 | HR 97 | Temp 99.2°F | Resp 18 | Ht 63.0 in | Wt 171.8 lb

## 2015-06-16 DIAGNOSIS — J948 Other specified pleural conditions: Secondary | ICD-10-CM

## 2015-06-16 DIAGNOSIS — C50312 Malignant neoplasm of lower-inner quadrant of left female breast: Secondary | ICD-10-CM

## 2015-06-16 DIAGNOSIS — Z8673 Personal history of transient ischemic attack (TIA), and cerebral infarction without residual deficits: Secondary | ICD-10-CM

## 2015-06-16 DIAGNOSIS — C50919 Malignant neoplasm of unspecified site of unspecified female breast: Secondary | ICD-10-CM

## 2015-06-16 DIAGNOSIS — C770 Secondary and unspecified malignant neoplasm of lymph nodes of head, face and neck: Secondary | ICD-10-CM | POA: Diagnosis not present

## 2015-06-16 DIAGNOSIS — F329 Major depressive disorder, single episode, unspecified: Secondary | ICD-10-CM | POA: Diagnosis not present

## 2015-06-16 DIAGNOSIS — J90 Pleural effusion, not elsewhere classified: Secondary | ICD-10-CM

## 2015-06-16 DIAGNOSIS — Z5111 Encounter for antineoplastic chemotherapy: Secondary | ICD-10-CM | POA: Diagnosis not present

## 2015-06-16 DIAGNOSIS — D696 Thrombocytopenia, unspecified: Secondary | ICD-10-CM

## 2015-06-16 LAB — COMPREHENSIVE METABOLIC PANEL (CC13)
ALT: 56 U/L — ABNORMAL HIGH (ref 0–55)
ANION GAP: 11 meq/L (ref 3–11)
AST: 62 U/L — ABNORMAL HIGH (ref 5–34)
Albumin: 2.2 g/dL — ABNORMAL LOW (ref 3.5–5.0)
Alkaline Phosphatase: 229 U/L — ABNORMAL HIGH (ref 40–150)
BUN: 9.8 mg/dL (ref 7.0–26.0)
CO2: 28 meq/L (ref 22–29)
CREATININE: 0.6 mg/dL (ref 0.6–1.1)
Calcium: 9.2 mg/dL (ref 8.4–10.4)
Chloride: 102 mEq/L (ref 98–109)
GLUCOSE: 109 mg/dL (ref 70–140)
Potassium: 3.5 mEq/L (ref 3.5–5.1)
Sodium: 141 mEq/L (ref 136–145)
TOTAL PROTEIN: 5.8 g/dL — AB (ref 6.4–8.3)
Total Bilirubin: 1 mg/dL (ref 0.20–1.20)

## 2015-06-16 LAB — CBC WITH DIFFERENTIAL/PLATELET
BASO%: 1 % (ref 0.0–2.0)
BASOS ABS: 0.1 10*3/uL (ref 0.0–0.1)
EOS ABS: 0 10*3/uL (ref 0.0–0.5)
EOS%: 0.1 % (ref 0.0–7.0)
HCT: 35.6 % (ref 34.8–46.6)
HGB: 11.9 g/dL (ref 11.6–15.9)
LYMPH%: 12.6 % — ABNORMAL LOW (ref 14.0–49.7)
MCH: 30.2 pg (ref 25.1–34.0)
MCHC: 33.4 g/dL (ref 31.5–36.0)
MCV: 90.5 fL (ref 79.5–101.0)
MONO#: 1.8 10*3/uL — ABNORMAL HIGH (ref 0.1–0.9)
MONO%: 16.4 % — ABNORMAL HIGH (ref 0.0–14.0)
NEUT%: 69.9 % (ref 38.4–76.8)
NEUTROS ABS: 7.5 10*3/uL — AB (ref 1.5–6.5)
Platelets: 460 10*3/uL — ABNORMAL HIGH (ref 145–400)
RBC: 3.94 10*6/uL (ref 3.70–5.45)
RDW: 18.5 % — AB (ref 11.2–14.5)
WBC: 10.7 10*3/uL — ABNORMAL HIGH (ref 3.9–10.3)
lymph#: 1.3 10*3/uL (ref 0.9–3.3)

## 2015-06-16 MED ORDER — OXYCODONE-ACETAMINOPHEN 5-325 MG PO TABS: 1.0000 | ORAL_TABLET | ORAL | Status: DC | PRN

## 2015-06-16 MED ORDER — DEXAMETHASONE SODIUM PHOSPHATE 100 MG/10ML IJ SOLN
Freq: Once | INTRAMUSCULAR | Status: AC
  Administered 2015-06-16: 10:00:00 via INTRAVENOUS
  Filled 2015-06-16: qty 4

## 2015-06-16 MED ORDER — HEPARIN SOD (PORK) LOCK FLUSH 100 UNIT/ML IV SOLN
500.0000 [IU] | Freq: Once | INTRAVENOUS | Status: AC | PRN
  Administered 2015-06-16: 500 [IU]
  Filled 2015-06-16: qty 5

## 2015-06-16 MED ORDER — FILGRASTIM 300 MCG/0.5ML IJ SOSY: 300.0000 ug | PREFILLED_SYRINGE | Freq: Once | INTRAMUSCULAR | Status: DC

## 2015-06-16 MED ORDER — ESCITALOPRAM OXALATE 20 MG PO TABS: 20.0000 mg | ORAL_TABLET | Freq: Every day | ORAL | Status: DC

## 2015-06-16 MED ORDER — FILGRASTIM 300 MCG/ML IJ SOLN: 300.0000 ug | Freq: Every day | INTRAVENOUS | Status: DC

## 2015-06-16 MED ORDER — OXYCODONE HCL ER 20 MG PO T12A: 40.0000 mg | EXTENDED_RELEASE_TABLET | Freq: Two times a day (BID) | ORAL | Status: DC

## 2015-06-16 MED ORDER — SODIUM CHLORIDE 0.9 % IV SOLN
800.0000 mg/m2 | Freq: Once | INTRAVENOUS | Status: AC
  Administered 2015-06-16: 1520 mg via INTRAVENOUS
  Filled 2015-06-16: qty 39.98

## 2015-06-16 MED ORDER — SODIUM CHLORIDE 0.9 % IJ SOLN
10.0000 mL | INTRAMUSCULAR | Status: DC | PRN
  Administered 2015-06-16: 10 mL
  Filled 2015-06-16: qty 10

## 2015-06-16 MED ORDER — BUPROPION HCL ER (XL) 150 MG PO TB24: 150.0000 mg | ORAL_TABLET | Freq: Every day | ORAL | Status: DC

## 2015-06-16 MED ORDER — SODIUM CHLORIDE 0.9 % IV SOLN
202.0500 mg | Freq: Once | INTRAVENOUS | Status: AC
  Administered 2015-06-16: 200 mg via INTRAVENOUS
  Filled 2015-06-16: qty 20

## 2015-06-16 MED ORDER — TOBRAMYCIN-DEXAMETHASONE 0.3-0.1 % OP SUSP: 1.0000 [drp] | Freq: Two times a day (BID) | OPHTHALMIC | Status: DC | PRN

## 2015-06-16 MED ORDER — SODIUM CHLORIDE 0.9 % IV SOLN
Freq: Once | INTRAVENOUS | Status: AC
  Administered 2015-06-16: 10:00:00 via INTRAVENOUS

## 2015-06-16 NOTE — Patient Instructions (Signed)
[facility] Discharge Instructions for Patients Receiving Chemotherapy  Today you received the following chemotherapy agents Gemzar and Carboplatin.  To help prevent nausea and vomiting after your treatment, we encourage you to take your nausea medication.    If you develop nausea and vomiting that is not controlled by your nausea medication, call the clinic.   BELOW ARE SYMPTOMS THAT SHOULD BE REPORTED IMMEDIATELY:  *FEVER GREATER THAN 100.5 F  *CHILLS WITH OR WITHOUT FEVER  NAUSEA AND VOMITING THAT IS NOT CONTROLLED WITH YOUR NAUSEA MEDICATION  *UNUSUAL SHORTNESS OF BREATH  *UNUSUAL BRUISING OR BLEEDING  TENDERNESS IN MOUTH AND THROAT WITH OR WITHOUT PRESENCE OF ULCERS  *URINARY PROBLEMS  *BOWEL PROBLEMS  UNUSUAL RASH Items with * indicate a potential emergency and should be followed up as soon as possible.  Feel free to call the clinic you have any questions or concerns. The clinic phone number is [phone].  Please show the CHEMO ALERT CARD at check-in to the Emergency Department and triage nurse.   

## 2015-06-17 ENCOUNTER — Ambulatory Visit (HOSPITAL_BASED_OUTPATIENT_CLINIC_OR_DEPARTMENT_OTHER): Payer: 59

## 2015-06-17 VITALS — BP 113/71 | HR 102 | Temp 98.3°F

## 2015-06-17 DIAGNOSIS — Z5189 Encounter for other specified aftercare: Secondary | ICD-10-CM

## 2015-06-17 DIAGNOSIS — C770 Secondary and unspecified malignant neoplasm of lymph nodes of head, face and neck: Secondary | ICD-10-CM

## 2015-06-17 DIAGNOSIS — C50312 Malignant neoplasm of lower-inner quadrant of left female breast: Secondary | ICD-10-CM

## 2015-06-17 MED ORDER — TBO-FILGRASTIM 300 MCG/0.5ML ~~LOC~~ SOSY
300.0000 ug | PREFILLED_SYRINGE | Freq: Once | SUBCUTANEOUS | Status: AC
  Administered 2015-06-17: 300 ug via SUBCUTANEOUS
  Filled 2015-06-17: qty 0.5

## 2015-06-18 ENCOUNTER — Ambulatory Visit (HOSPITAL_BASED_OUTPATIENT_CLINIC_OR_DEPARTMENT_OTHER): Payer: 59

## 2015-06-18 ENCOUNTER — Encounter: Payer: Self-pay | Admitting: Oncology

## 2015-06-18 ENCOUNTER — Other Ambulatory Visit: Payer: Self-pay | Admitting: *Deleted

## 2015-06-18 VITALS — BP 116/72 | HR 97 | Temp 99.0°F

## 2015-06-18 DIAGNOSIS — C770 Secondary and unspecified malignant neoplasm of lymph nodes of head, face and neck: Secondary | ICD-10-CM

## 2015-06-18 DIAGNOSIS — Z5189 Encounter for other specified aftercare: Secondary | ICD-10-CM

## 2015-06-18 DIAGNOSIS — C50312 Malignant neoplasm of lower-inner quadrant of left female breast: Secondary | ICD-10-CM | POA: Diagnosis not present

## 2015-06-18 MED ORDER — TBO-FILGRASTIM 300 MCG/0.5ML ~~LOC~~ SOSY
300.0000 ug | PREFILLED_SYRINGE | Freq: Once | SUBCUTANEOUS | Status: AC
  Administered 2015-06-18: 300 ug via SUBCUTANEOUS
  Filled 2015-06-18: qty 0.5

## 2015-06-18 NOTE — Progress Notes (Signed)
I faxed [employer] prior auth for oxycontin 20 mg tablet.

## 2015-06-19 ENCOUNTER — Ambulatory Visit (HOSPITAL_BASED_OUTPATIENT_CLINIC_OR_DEPARTMENT_OTHER): Payer: 59

## 2015-06-19 VITALS — BP 115/71 | HR 121 | Temp 99.2°F

## 2015-06-19 DIAGNOSIS — Z5189 Encounter for other specified aftercare: Secondary | ICD-10-CM | POA: Diagnosis not present

## 2015-06-19 DIAGNOSIS — C770 Secondary and unspecified malignant neoplasm of lymph nodes of head, face and neck: Secondary | ICD-10-CM | POA: Diagnosis not present

## 2015-06-19 DIAGNOSIS — C50312 Malignant neoplasm of lower-inner quadrant of left female breast: Secondary | ICD-10-CM | POA: Diagnosis not present

## 2015-06-19 MED ORDER — TBO-FILGRASTIM 300 MCG/0.5ML ~~LOC~~ SOSY
300.0000 ug | PREFILLED_SYRINGE | Freq: Once | SUBCUTANEOUS | Status: AC
  Administered 2015-06-19: 300 ug via SUBCUTANEOUS
  Filled 2015-06-19: qty 0.5

## 2015-06-23 ENCOUNTER — Encounter: Payer: Self-pay | Admitting: Genetic Counselor

## 2015-06-23 ENCOUNTER — Encounter: Payer: Self-pay | Admitting: Oncology

## 2015-06-23 ENCOUNTER — Other Ambulatory Visit (HOSPITAL_BASED_OUTPATIENT_CLINIC_OR_DEPARTMENT_OTHER): Payer: 59

## 2015-06-23 ENCOUNTER — Ambulatory Visit (HOSPITAL_BASED_OUTPATIENT_CLINIC_OR_DEPARTMENT_OTHER): Payer: 59

## 2015-06-23 ENCOUNTER — Ambulatory Visit: Payer: 59

## 2015-06-23 ENCOUNTER — Ambulatory Visit (HOSPITAL_BASED_OUTPATIENT_CLINIC_OR_DEPARTMENT_OTHER): Payer: 59 | Admitting: Oncology

## 2015-06-23 VITALS — BP 132/76 | HR 104 | Temp 97.5°F | Wt 170.2 lb

## 2015-06-23 DIAGNOSIS — Z5189 Encounter for other specified aftercare: Secondary | ICD-10-CM

## 2015-06-23 DIAGNOSIS — Z5111 Encounter for antineoplastic chemotherapy: Secondary | ICD-10-CM

## 2015-06-23 DIAGNOSIS — C50312 Malignant neoplasm of lower-inner quadrant of left female breast: Secondary | ICD-10-CM | POA: Diagnosis not present

## 2015-06-23 DIAGNOSIS — C760 Malignant neoplasm of head, face and neck: Secondary | ICD-10-CM

## 2015-06-23 DIAGNOSIS — J90 Pleural effusion, not elsewhere classified: Secondary | ICD-10-CM | POA: Diagnosis not present

## 2015-06-23 DIAGNOSIS — C50919 Malignant neoplasm of unspecified site of unspecified female breast: Secondary | ICD-10-CM

## 2015-06-23 LAB — CBC WITH DIFFERENTIAL/PLATELET
BASO%: 0.7 % (ref 0.0–2.0)
Basophils Absolute: 0.1 10*3/uL (ref 0.0–0.1)
EOS%: 0 % (ref 0.0–7.0)
Eosinophils Absolute: 0 10*3/uL (ref 0.0–0.5)
HCT: 31 % — ABNORMAL LOW (ref 34.8–46.6)
HGB: 10.2 g/dL — ABNORMAL LOW (ref 11.6–15.9)
LYMPH%: 11.2 % — ABNORMAL LOW (ref 14.0–49.7)
MCH: 30.2 pg (ref 25.1–34.0)
MCHC: 32.9 g/dL (ref 31.5–36.0)
MCV: 91.8 fL (ref 79.5–101.0)
MONO#: 1.5 10*3/uL — ABNORMAL HIGH (ref 0.1–0.9)
MONO%: 17.2 % — ABNORMAL HIGH (ref 0.0–14.0)
NEUT#: 6.1 10*3/uL (ref 1.5–6.5)
NEUT%: 70.9 % (ref 38.4–76.8)
Platelets: 218 10*3/uL (ref 145–400)
RBC: 3.38 10*6/uL — AB (ref 3.70–5.45)
RDW: 18.6 % — ABNORMAL HIGH (ref 11.2–14.5)
WBC: 8.6 10*3/uL (ref 3.9–10.3)
lymph#: 1 10*3/uL (ref 0.9–3.3)

## 2015-06-23 LAB — COMPREHENSIVE METABOLIC PANEL (CC13)
ALBUMIN: 2.2 g/dL — AB (ref 3.5–5.0)
ALT: 62 U/L — AB (ref 0–55)
ANION GAP: 7 meq/L (ref 3–11)
AST: 45 U/L — AB (ref 5–34)
Alkaline Phosphatase: 236 U/L — ABNORMAL HIGH (ref 40–150)
BILIRUBIN TOTAL: 0.59 mg/dL (ref 0.20–1.20)
BUN: 8.1 mg/dL (ref 7.0–26.0)
CO2: 29 mEq/L (ref 22–29)
CREATININE: 0.7 mg/dL (ref 0.6–1.1)
Calcium: 9 mg/dL (ref 8.4–10.4)
Chloride: 105 mEq/L (ref 98–109)
Glucose: 117 mg/dl (ref 70–140)
Potassium: 4.1 mEq/L (ref 3.5–5.1)
SODIUM: 141 meq/L (ref 136–145)
Total Protein: 5.8 g/dL — ABNORMAL LOW (ref 6.4–8.3)

## 2015-06-23 MED ORDER — SODIUM CHLORIDE 0.9 % IV SOLN
Freq: Once | INTRAVENOUS | Status: AC
  Administered 2015-06-23: 11:00:00 via INTRAVENOUS
  Filled 2015-06-23: qty 4

## 2015-06-23 MED ORDER — PEGFILGRASTIM 6 MG/0.6ML ~~LOC~~ PSKT
6.0000 mg | PREFILLED_SYRINGE | Freq: Once | SUBCUTANEOUS | Status: AC
  Administered 2015-06-23: 6 mg via SUBCUTANEOUS
  Filled 2015-06-23: qty 0.6

## 2015-06-23 MED ORDER — SODIUM CHLORIDE 0.9 % IV SOLN
800.0000 mg/m2 | Freq: Once | INTRAVENOUS | Status: AC
  Administered 2015-06-23: 1520 mg via INTRAVENOUS
  Filled 2015-06-23: qty 39.98

## 2015-06-23 MED ORDER — SODIUM CHLORIDE 0.9 % IJ SOLN
10.0000 mL | INTRAMUSCULAR | Status: DC | PRN
  Administered 2015-06-23: 10 mL
  Filled 2015-06-23: qty 10

## 2015-06-23 MED ORDER — SODIUM CHLORIDE 0.9 % IV SOLN
Freq: Once | INTRAVENOUS | Status: AC
  Administered 2015-06-23: 11:00:00 via INTRAVENOUS

## 2015-06-23 MED ORDER — HEPARIN SOD (PORK) LOCK FLUSH 100 UNIT/ML IV SOLN
500.0000 [IU] | Freq: Once | INTRAVENOUS | Status: AC | PRN
  Administered 2015-06-23: 500 [IU]
  Filled 2015-06-23: qty 5

## 2015-06-23 MED ORDER — SODIUM CHLORIDE 0.9 % IV SOLN
202.0500 mg | Freq: Once | INTRAVENOUS | Status: AC
  Administered 2015-06-23: 200 mg via INTRAVENOUS
  Filled 2015-06-23: qty 20

## 2015-06-23 NOTE — Progress Notes (Signed)
Per [facility] they can only approved up to 62 pills per month based on her plan . 06/18/16 was approved for oxycontin

## 2015-06-23 NOTE — Progress Notes (Signed)
ID: Kristin Hoffman OB: [birth year]  MR#: [id number]  NAT#:[id number]  PCP: Drake Leach, MD GYN:  Kristin Hoffman SU: Kristin Hoffman OTHER MD: Kristin Hoffman, Kristin Hoffman  CHIEF COMPLAINT:  Stage IV breast cancer  CURRENT TREATMENT: carboplatin/ gemcitabine  BREAST CANCER HISTORY: This patient was previously followed by Kristin Hoffman, and was transferred to Kristin Hoffman service as of 08/20/2013.  At the age of 49, the patient had a screening mammogram as a baseline. She had recently given birth and was on birth control pills at that time. The mammogram in November 2006 showed an area of architectural distortion in the left lower inner quadrant. Subsequently an ultrasound was obtained of the left breast showing a 2.0 x 1.5 x 1.4 cm mass, at the 8:00 position, 4 cm from the nipple. A core biopsy performed on 10/21/2005 showed an invasive mammary carcinoma, ER +70%, PR +41%, HER-2/neu negative, with proliferation marker of 38%. [phone])  Breast MRI on 11/08/2005 confirmed a 2.5 cm spiculated mass in the left lower inner quadrant with 3 small satellite nodules adjacent to the primary mass: 3.5 cm posterior medial to the primary mass, measuring 9 mm; 1.5 cm anterior medial to the primary mass measuring 5 mm; and 2 cm medial to the primary mass measuring 5 mm. No axillary adenopathy was noted. No suspicious masses or enhancement are noted in the right breast.  Kristin Hoffman underwentt left lumpectomy under the care of Kristin Hoffman on 11/18/2005 for a 2.5 cm grade 3 invasive ductal carcinoma, ER +70%, PR +41%, HER-2/neu negative, with proliferation marker of 38%. 2 of 23 lymph nodes were involved.  Margins were clear.  She received adjuvant chemotherapy consisting of 6 q. three-week doses of docetaxel/doxorubicin/cyclophosphamide given between January 2007 and may of 2007, with last dose on 04/20/2006. She underwent radiation therapy completed 07/11/2006, after which she began on tamoxifen in early August  2007.  She underwent hysterectomy with bilateral salpingo-oophorectomy on 07/15/2008, and was started on letrozole in October of 2009.  A mammogram 11/10/2009 showed microcalcifications in the left breast, and a subsequent biopsy on 11/18/2009 confirmed invasive mammary carcinoma in the left breast. PET and CT of the chest showed no evidence of metastasis in January 2011.  She underwent bilateral mastectomies 01/25/2010, with the right breast showing no evidence of malignancy; in the left breast showing a 1.0 cm grade 2 invasive ductal carcinoma with high-grade DCIS. Tumor was ER +22%, PR negative, HER-2/neu negative, with proliferation marker of 13%. Margins were clear. Patient underwent concurrent left latissimus flap reconstruction and right implant reconstruction.  She received additional chemotherapy with one cycle of carboplatin/gemcitabine given on 03/11/2010. She then received 5 q. three-week cycles of IV CMF between 03/25/2010 and 06/17/2010.  She was started on exemestane in January 2012 and continued until late November 2013 when she was hospitalized for apparent TIA.   Her subsequent history is as detailed below  INTERVAL HISTORY: Kristin Hoffman returns today for follow up of her metastatic breast cancer. Today is day 8 cycle 2 of 3 cycles planned of carboplatin and gemcitabine given on days 1 and 8 of each cycle. She receives Neupogen on days 2 and 3 and 4 of each cycle and then onpro on day 9. Carboplatin dose has been reduced to 1.5 due to previous thrombocytopenia.    REVIEW OF SYSTEMS: Kristin Hoffman is overall doing well today. Continues to tolerate her chemotherapy well. At her last visit she was having more difficulty with pain was given a prescription for OxyContin, but  she has not started taking this. Indicates that she is currently taking tramadol and ibuprofen every 6 hours which is controlling her pain. She is not sure she is going to start taking the OxyContin or not. She also has oxycodone/APAP  at home if she needs it. She was also given a prescription for Wellbutrin at her last visit because she was tearful and she is indicated she has not started taking this either and feels as though her mood is well-controlled this time. There have been no unusual headaches no other visual changes, no nausea or vomiting, no cough or phlegm production though she can become anxious and then particularly short of breath. She hasn't felt like she couldn't handle the shortness of breath however. She feels though her shortness of breath is at baseline and does not feel as though she needs a thoracentesis at this time. A detailed review of systems was otherwise stable  PAST MEDICaL HISTORY: Past Medical History  Diagnosis Date  . Depression   . Reflux   . Hx-TIA (transient ischemic attack) 11/22/2013  . Chronic headaches 11/22/2013  . Anxiety 11/22/2013  . Breast cancer   . Cancer   . Breast cancer metastasized to multiple sites 12/24/2013    PAST SURGICAL HISTORY: Past Surgical History  Procedure Laterality Date  . Mastectomy w/ nodes partial    . Mastectomy    . Breast reconstruction    . Portacath placement      x 2  . Portacath removal      x 2  . Abdominal hysterectomy    . Tee without cardioversion  11/12/2012    Procedure: TRANSESOPHAGEAL ECHOCARDIOGRAM (TEE);  Surgeon: Kristin Furbish, MD;  Location: [facility] END[facility];  Service: Cardiovascular;  Laterality: N/A;  This TEE may be Dr. Marlou Porch or a LHC Dr    FAMILY HISTORY Both parents are alive and well. Patient has one sister who is 25 years younger and is in good health. No other history of breast or ovarian cancer in the family. Family History  Problem Relation Age of Onset  . Diabetes Mellitus II Neg Hx   . Hypertension Neg Hx     GYNECOLOGIC HISTORY:   (Updated January 2015) G2P2, menarche at age 11 with irregular menses. On birth control pills in the past. On Clomid to induce ovulation with first pregnancy. Also had preeclampsia with  first pregnancy. Status post hysterectomy and bilateral salpingo-oophorectomy in August 2009.  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 She is originally from [place]. She's been married to Trinity, for 13 years. He works as an Pharmacist, hospital at [employer].   ADVANCED DIRECTIVES:  Not in place; Not in place. At the clinic visit 06/23/2015 the patient was given the appropriate forms to complete and notarize at her discretion.    HEALTH MAINTENANCE:  (Updated 11/22/2013) History  Substance Use Topics  . Smoking status: Never Smoker   . Smokeless tobacco: Never Used  . Alcohol Use: Yes     Comment: rarely     Colonoscopy:  Never  PAP: S/P LAVH/BSO in August 2009  Bone density: Never  Lipid panel: Not on file   Allergies  Allergen Reactions  . Afinitor [Everolimus] Hives and Itching    Current Outpatient Prescriptions  Medication Sig Dispense Refill  . acidophilus (RISAQUAD) CAPS capsule Take 1 capsule by mouth every morning.     Marland Kitchen aspirin EC  81 MG tablet Take 81 mg by mouth daily with breakfast.    . Biotin 5000 MCG CAPS Take 5,000 mcg by mouth every morning.  30 capsule   . cetirizine (ZYRTEC) 10 MG tablet Take 10 mg by mouth at bedtime.     . docusate sodium (COLACE) 100 MG capsule Take 1 capsule (100 mg total) by mouth 2 (two) times daily. 20 capsule 0  . escitalopram (LEXAPRO) 20 MG tablet Take 1 tablet (20 mg total) by mouth daily. 30 tablet 12  . filgrastim (NEUPOGEN) 300 MCG/0.5ML SOSY injection Inject 0.5 mLs (300 mcg total) into the skin once. 6 Syringe 1  . Ibuprofen (ADVIL) 200 MG CAPS Take 2 capsules (400 mg total) by mouth 3 (three) times daily with meals as needed. (Patient taking differently: Take 400 mg by mouth every 6 (six) hours as needed (for pain). ) 120 each 0  . LORazepam (ATIVAN) 0.5 MG tablet TAKE 1 TABLET BY MOUTH AS NEEDED FOR NAUSEA AND VOMITING (Patient  taking differently: TAKE 1 TABLET BY MOUTH every night at bedtime) 30 tablet 0  . Melatonin 10 MG TABS Take 1 tablet by mouth at bedtime.    . montelukast (SINGULAIR) 10 MG tablet Take 10 mg by mouth daily with breakfast.     . Multiple Vitamin (MULTIVITAMIN) tablet Take 1 tablet by mouth every morning.     . nystatin (MYCOSTATIN) 100000 UNIT/ML suspension Take 5 mLs (500,000 Units total) by mouth 4 (four) times daily. 240 mL 1  . omeprazole (PRILOSEC) 40 MG capsule Take 40 mg by mouth every morning.     . ondansetron (ZOFRAN) 8 MG tablet Take 8 mg by mouth 2 (two) times daily. Starts day after chemo    . prochlorperazine (COMPAZINE) 10 MG tablet Take 10 mg by mouth every 6 (six) hours as needed for nausea or vomiting.     . tobramycin-dexamethasone (TOBRADEX) ophthalmic solution Place 1 drop into both eyes 2 (two) times daily as needed. 5 mL 0  . traMADol (ULTRAM) 50 MG tablet Take 1 tablet (50 mg total) by mouth every 6 (six) hours as needed. (Patient taking differently: Take 50 mg by mouth every 6 (six) hours as needed for moderate pain. ) 30 tablet 3  . buPROPion (WELLBUTRIN XL) 150 MG 24 hr tablet Take 1 tablet (150 mg total) by mouth daily. (Patient not taking: Reported on 06/23/2015) 30 tablet 12  . Cholecalciferol 2000 UNITS CHEW Chew 1 tablet by mouth daily with breakfast.     . OxyCODONE (OXYCONTIN) 20 mg T12A 12 hr tablet Take 2 tablets (40 mg total) by mouth every 12 (twelve) hours. (Patient not taking: Reported on 06/23/2015) 60 tablet 0  . oxyCODONE-acetaminophen (PERCOCET/ROXICET) 5-325 MG per tablet Take 1 tablet by mouth every 4 (four) hours as needed for moderate pain. (Patient not taking: Reported on 06/23/2015) 30 tablet 0  . polyethylene glycol (MIRALAX / GLYCOLAX) packet Take 17 g by mouth daily. (Patient not taking: Reported on 06/08/2015) 14 each 0   No current facility-administered medications for this visit.   Facility-Administered Medications Ordered in Other Visits   Medication Dose Route Frequency Provider Last Rate Last Dose  . heparin lock flush 100 unit/mL  500 Units Intracatheter Once PRN Chauncey Cruel, MD      . pegfilgrastim (NEULASTA ONPRO KIT) injection 6 mg  6 mg Subcutaneous Once Chauncey Cruel, MD      . sodium chloride 0.9 % injection 10 mL  10 mL Intracatheter PRN  Chauncey Cruel, MD        OBJECTIVE: Young white woman who appears stated age  49 Vitals:   06/23/15 0936  BP: 132/76  Pulse: 104  Temp: 97.5 F (36.4 C)  Body mass index is 30.16 kg/(m^2).  ECOG: 2 Filed Weights   06/23/15 0936  Weight: 170 lb 3.2 oz (77.202 kg)     Sclerae unicteric, pupils round and equal, eyelids minimally irritated, no discharge Oropharynx clear and moist-- no thrush or other lesions No cervical or supraclavicular adenopathy Lungs dullness at both bases as previously noted Heart regular rate and rhythm Abd soft, nontender, positive bowel sounds MSK no focal spinal tenderness, no upper extremity lymphedema Neuro: nonfocal, well oriented, appropriate affect Breasts: Deferred    LAB RESULTS:   Lab Results  Component Value Date   WBC 8.6 06/23/2015   NEUTROABS 6.1 06/23/2015   HGB 10.2* 06/23/2015   HCT 31.0* 06/23/2015   MCV 91.8 06/23/2015   PLT 218 06/23/2015      Chemistry      Component Value Date/Time   NA 141 06/23/2015 0920   NA 138 05/30/2015 0525   K 4.1 06/23/2015 0920   K 3.7 05/30/2015 0525   CL 104 05/30/2015 0525   CL 104 11/14/2012 1407   CO2 29 06/23/2015 0920   CO2 27 05/30/2015 0525   BUN 8.1 06/23/2015 0920   BUN 16 05/30/2015 0525   CREATININE 0.7 06/23/2015 0920   CREATININE 0.68 05/30/2015 0525      Component Value Date/Time   CALCIUM 9.0 06/23/2015 0920   CALCIUM 8.2* 05/30/2015 0525   ALKPHOS 236* 06/23/2015 0920   ALKPHOS 88 05/30/2015 0525   AST 45* 06/23/2015 0920   AST 193* 05/30/2015 0525   ALT 62* 06/23/2015 0920   ALT 76* 05/30/2015 0525   BILITOT 0.59 06/23/2015 0920    BILITOT 1.0 05/30/2015 0525      STUDIES: Dg Chest 1 View  06/05/2015   CLINICAL DATA:  Status post left thoracentesis  EXAM: CHEST  1 VIEW  COMPARISON:  05/29/2015  FINDINGS: Cardiac shadow is within normal limits. The right chest wall port is again seen. Right basilar mass is again seen and stable. There is been interval left-sided thoracentesis with decrease in the appearance of the left-sided pleural effusion. No pneumothorax is noted. There remains a loculated component superiorly.  IMPRESSION: No pneumothorax following thoracentesis on the left. Persistent right basilar mass lesion and loculated component of pleural fluid on the left is noted.   Electronically Signed   By: Inez Catalina M.D.   On: 06/05/2015 15:16   Dg Chest 1 View  05/29/2015   CLINICAL DATA:  Post LEFT thoracentesis, metastatic breast cancer  EXAM: CHEST  1 VIEW  COMPARISON:  Repeat exam 1633 hours compared to 1537 hours  FINDINGS: RIGHT jugular Port-A-Cath stable tip projecting over SVC.  Decrease in LEFT pleural effusion post thoracentesis.  No pneumothorax.  Persistent atelectasis infiltrate at LEFT base.  Large RIGHT basilar metastasis and scattered interstitial infiltrates in the mid lower RIGHT lung unchanged.  Thoracolumbar scoliosis and slight osseous demineralization.  IMPRESSION: No pneumothorax following LEFT thoracentesis.   Electronically Signed   By: Lavonia Dana M.D.   On: 05/29/2015 16:52   Ct Angio Chest Pe W/cm &/or Wo Cm  05/29/2015   CLINICAL DATA:  Worsening shortness of breath for 1.5 weeks. Now with chest pain on breathing.  EXAM: CT ANGIOGRAPHY CHEST WITH CONTRAST  TECHNIQUE: Multidetector CT imaging of  the chest was performed using the standard protocol during bolus administration of intravenous contrast. Multiplanar CT image reconstructions and MIPs were obtained to evaluate the vascular anatomy.  CONTRAST:  58m OMNIPAQUE IOHEXOL 350 MG/ML SOLN  COMPARISON:  05/22/2015  FINDINGS: Technically adequate  study with good opacification of the central and segmental pulmonary arteries. No focal filling defects demonstrated. No evidence of significant pulmonary embolus.  Mild cardiac enlargement. Normal caliber thoracic aorta. No aortic dissection or aneurysm. Great vessel origins are patent. Aberrant right subclavian artery. Calcified lymph nodes in the mediastinum could represent old granulomatous infection or treated tumor. Enlarged lymph node in the left supraclavicular region measuring 3.6 cm. Enlarged right paratracheal lymph node measuring 2.4 cm. These are similar in size to prior study and consistent with metastasis. Large bilateral pleural masses, measuring 11.8 x 6.1 cm on the left and 3.4 cm on the right. Small bilateral pleural effusions. Basilar atelectasis. 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 Appearances are similar to prior study. Multiple metastases in the liver with heterogeneous parenchymal enhancement pattern corresponding to known portal venous compression. Portal veins are not entirely included within the study for comparison. Bilateral breast implants. No destructive bone lesions are appreciated.  Review of the MIP images confirms the above findings.  IMPRESSION: Diffuse metastatic disease in the chest involving left clavicular and right paratracheal lymph nodes, extensive pleural metastases bilaterally, bilateral pleural effusions, interstitial and nodular parenchymal metastases, and liver metastases. Appearances are similar to previous study. No evidence of significant pulmonary embolus.   Electronically Signed   By: WLucienne CapersM.D.   On: 05/29/2015 23:17   Dg Chest Port 1 View  05/29/2015   CLINICAL DATA:  Shortness of breath. Chest pain for the past week, worsening. Recent thoracentesis. Ongoing chemotherapy for breast cancer.  EXAM: PORTABLE CHEST - 1 VIEW  COMPARISON:  05/22/2015  FINDINGS: Power injectable  right internal jugular Port-A-Cath tip: Cavoatrial junction.  Right middle lobe lung mass. Indistinct pulmonary vasculature. Abnormal bilateral interstitial accentuation. Layering large left pleural effusion, mostly lateral. Indistinct left cardiac border. No pneumothorax.  IMPRESSION: 1. Large left pleural effusion with known large pleural mass on the left. 2. Right middle lobe lung mass. 3. Interstitial pulmonary edema.   Electronically Signed   By: WVan ClinesM.D.   On: 05/29/2015 15:48   Ir Patient Eval Tech 0-60 Mins  06/12/2015   Patient Information   Patient Name Sex DOB SSN  LLayanna, CharoFemale [id number] Progress Notes by AMarkus Daft MD at 06/11/2015 5:26 PM   Author: AMarkus Daft MD Service: Radiology Author Type: Physician  Filed: 06/11/2015 5:35 PM Note Time: 06/11/2015 5:26 PM Status: Signed  Editor: AMarkus Daft MD (Physician)    Expand All Collapse All    Patient ID: CLaureen Hoffman female DOB: [id number] 49y.o. MRN: [id number] was scheduled for a tunneled left pleural catheter in interventional radiology. I discussed the tunneled pleural catheter with the patient and her father in depth. I also evaluated the patient's left chest with ultrasound. The patient has a complex or loculated collection along the anterior left chest which is not amenable to percutaneous drainage. The patient has had previous thoracentesis procedures in the past but these were performed in the posterior left chest. Patient has minimal fluid in this area based on ultrasound today. I reviewed the ultrasound images and previous CT images with the patient. I am concerned that the patient may not tolerate a  tunneled pleural catheter because she has had severe pain and discomfort with ultrasound guided thoracentesis procedures in the past. Fortunately, the patient's pleural fluid has not reaccumulated as fast as it has in the past. At this time, we will continue ultrasound-guided thoracentesis  procedures as needed. We may need to consider using moderate sedation in the future because the patient has some much discomfort with thoracentesis procedures.       US Thoracentesis Asp Pleural Space W/img Guide  06/05/2015   INDICATION: Symptomatic recurrent left sided pleural effusion  EXAM: US THORACENTESIS ASP PLEURAL SPACE W/IMG GUIDE  COMPARISON:  05/29/15 thoracentesis.  MEDICATIONS: None  COMPLICATIONS: None immediate  TECHNIQUE: Informed written consent was obtained from the patient after a discussion of the risks, benefits and alternatives to treatment. A timeout was performed prior to the initiation of the procedure.  Initial ultrasound scanning demonstrates a left pleural effusion. The lower chest was prepped and draped in the usual sterile fashion. 1% lidocaine was used for local anesthesia.  Under direct ultrasound guidance, a 19 gauge, 7-cm, Yueh catheter was introduced. An ultrasound image was saved for documentation purposes. The thoracentesis was performed. The catheter was removed and a dressing was applied. The patient tolerated the procedure well without immediate post procedural complication. The patient was escorted to have an upright chest radiograph.  FINDINGS: A total of approximately 1.2 liters of amber colored fluid was removed.  IMPRESSION: Successful ultrasound-guided left sided thoracentesis yielding 1.2 liters of pleural fluid.  Read By:  Tsosie Billing PA-C   Electronically Signed   By: Sandi Mariscal M.D.   On: 06/05/2015 15:35   US Thoracentesis Asp Pleural Space W/img Guide  05/29/2015   INDICATION: Symptomatic recurrent left sided pleural effusion  EXAM: US THORACENTESIS ASP PLEURAL SPACE W/IMG GUIDE  COMPARISON:  CXR 05/29/15.  MEDICATIONS: None  COMPLICATIONS: None immediate  TECHNIQUE: Informed written consent was obtained from the patient after a discussion of the risks, benefits and alternatives to treatment. A timeout was performed prior to the initiation of the  procedure.  Initial ultrasound scanning demonstrates a left pleural effusion. The lower chest was prepped and draped in the usual sterile fashion. 1% lidocaine was used for local anesthesia.  Under direct ultrasound guidance, a 19 gauge, 7-cm, Yueh catheter was introduced. An ultrasound image was saved for documentation purposes. The thoracentesis was performed. The catheter was removed and a dressing was applied. The patient tolerated the procedure well without immediate post procedural complication. The patient was escorted to have an upright chest radiograph.  FINDINGS: A total of approximately 1.2 liters of serous/amber fluid was removed.  IMPRESSION: Successful ultrasound-guided left sided thoracentesis yielding 1.2 liters of pleural fluid.  Read By:  Tsosie Billing PA-C   Electronically Signed   By: Corrie Mckusick D.O.   On: 05/29/2015 16:38     ASSESSMENT: 49 y.o. 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 2 of 23 lymph nodes were involved.  Margins were clear.  (2) status post adjuvant chemotherapy consisting of 6 q. three-week doses of docetaxel/ doxorubicin/ cyclophosphamide given between January 2007 and may of 2007, with last dose on 04/20/2006.  (3) status post radiation therapy to the left breast, completed 07/11/2006  (4) began on tamoxifen in early August 2007 and continued until October 2009. She status post hysterectomy with bilateral  salpingo-oophorectomy on 07/15/2008, and was started on letrozole in October of 2009.  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 PET and CT of the chest showed no evidence of metastasis in January 2011.  (6) status post bilateral mastectomies 01/25/2010, with the  right breast showing no evidence of malignancy; in the left breast showing a 1.0 cm grade 2 invasive ductal carcinoma,  ER +22%, PR negative, HER-2/neu negative, with proliferation marker of 13%. Margins were clear. Patient underwent concurrent left latissimus flap reconstruction and right implant reconstruction.  (7)  status post additional chemotherapy with one cycle of carboplatin/gemcitabine given on 03/11/2010. She then received 5 q. three-week cycles of IV CMF between 03/25/2010 and 06/17/2010.  (8) started on exemestane in January 2012 and continued until late November 2013 when she was hospitalized for apparent TIA at which time her exemestane was stopped and not resumed  (9) METASTATIC DISEASE: evidence of disease recurence noted on chest CT, liver MRI and PET scan in December 2014, with suspicious lung nodules, lymphadenopathy, and 3 lesions in the right lobe of the liver, but no evidence of bony disease and no evidence of brain involvement by brain MRI September 2014. Biopsy of a left supraclavicular lymph node on 12/11/2013 confirmed metastatic carcinoma, estrogen receptor 45% positive, progesterone receptor 17% positive, with no HER-2 amplification.  (10) fulvestrant started 12/03/2013, discontinued 03/25/2014 with progression  (11) exemestane/ everolimus started 04/04/2014; everolimus held 04/14/2014, with skin rash and mouth soreness; resumed at 5 mg a day as of 04/29/2014, discontinued 05/09/2014 with similar symptoms  (12) continueing exemestane, adding palbociclib 05/26/2014;   (a) palbociclib dose dropped to 75 mg every other day for 21 days beginning with cycle 4  (b) exemestane and Palbociclib discontinued December 2015 with evidence of progression  (13) [facility] referral placed 04/28/2049 -- re-sent 05/23/2014-- shows Pi3KCA and TP53 mutations  (a) [facility] one test requested 12/10/2014-- confirms [employer] results  (b) did not qualify for capecitabine/ BYL 719 trial because of  elevated lipase  (14) started eribulin 12/26/2014, 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   (a) dpse dropped from 2g BID to 1.5g BID starting on 04/26/15  (b) stopped 05/17/2015 (after 4 cycles) with progression  (16) started carboplatin/ gemcitabine 05/26/2015, to be given day one and day 8 of each 21 day cycle  (a) given her history of severe neutropenia with this chemotherapy we'll do Neupogen days 2, 3 and 4 of each cycle and onpro day 9  (b) carbo dose dropped by 25% w cycle 2 due to very low platelet nadir  (17) Left pleural effusion\   (a) s/p L thoracentesis 05/22/2015, 05/29/2015 and 06/05/2015   PLAN:   Maritsa is doing well at this time. Counts have been reviewed and I have recommended that she proceed with cycle 2 day 8 of her chemotherapy as scheduled today.  Her pain seems do well controlled with the use of tramadol with ibuprofen. I discussed that she should take her ibuprofen with food to avoid gastric irritation.   The patient thinks her mood is well controlled at this time therefore she will continue to take her Lexapro as already taking. She has Wellbutrin at home and will think about starting it.  She will return next week to check  her counts and for symptom management.    Mikey Bussing, NP   06/23/2015 12:42 PM

## 2015-06-23 NOTE — Patient Instructions (Signed)
[facility] Discharge Instructions for Patients Receiving Chemotherapy  Today you received the following chemotherapy agents Gemzar and Carboplatin.  To help prevent nausea and vomiting after your treatment, we encourage you to take your nausea medication.    If you develop nausea and vomiting that is not controlled by your nausea medication, call the clinic.   BELOW ARE SYMPTOMS THAT SHOULD BE REPORTED IMMEDIATELY:  *FEVER GREATER THAN 100.5 F  *CHILLS WITH OR WITHOUT FEVER  NAUSEA AND VOMITING THAT IS NOT CONTROLLED WITH YOUR NAUSEA MEDICATION  *UNUSUAL SHORTNESS OF BREATH  *UNUSUAL BRUISING OR BLEEDING  TENDERNESS IN MOUTH AND THROAT WITH OR WITHOUT PRESENCE OF ULCERS  *URINARY PROBLEMS  *BOWEL PROBLEMS  UNUSUAL RASH Items with * indicate a potential emergency and should be followed up as soon as possible.  Feel free to call the clinic you have any questions or concerns. The clinic phone number is [phone].  Please show the CHEMO ALERT CARD at check-in to the Emergency Department and triage nurse.   

## 2015-06-27 ENCOUNTER — Other Ambulatory Visit: Payer: Self-pay | Admitting: Oncology

## 2015-06-30 ENCOUNTER — Other Ambulatory Visit: Payer: Self-pay | Admitting: *Deleted

## 2015-06-30 ENCOUNTER — Ambulatory Visit: Payer: 59

## 2015-06-30 ENCOUNTER — Other Ambulatory Visit (HOSPITAL_BASED_OUTPATIENT_CLINIC_OR_DEPARTMENT_OTHER): Payer: 59

## 2015-06-30 ENCOUNTER — Encounter: Payer: Self-pay | Admitting: Nurse Practitioner

## 2015-06-30 ENCOUNTER — Telehealth: Payer: Self-pay | Admitting: Nurse Practitioner

## 2015-06-30 ENCOUNTER — Ambulatory Visit (HOSPITAL_BASED_OUTPATIENT_CLINIC_OR_DEPARTMENT_OTHER): Payer: 59 | Admitting: Nurse Practitioner

## 2015-06-30 VITALS — BP 126/74 | HR 115 | Temp 99.1°F | Resp 18 | Ht 63.0 in | Wt 170.6 lb

## 2015-06-30 DIAGNOSIS — C799 Secondary malignant neoplasm of unspecified site: Secondary | ICD-10-CM

## 2015-06-30 DIAGNOSIS — D696 Thrombocytopenia, unspecified: Secondary | ICD-10-CM | POA: Diagnosis not present

## 2015-06-30 DIAGNOSIS — D630 Anemia in neoplastic disease: Secondary | ICD-10-CM

## 2015-06-30 DIAGNOSIS — C50919 Malignant neoplasm of unspecified site of unspecified female breast: Secondary | ICD-10-CM | POA: Diagnosis not present

## 2015-06-30 DIAGNOSIS — C50312 Malignant neoplasm of lower-inner quadrant of left female breast: Secondary | ICD-10-CM | POA: Diagnosis not present

## 2015-06-30 LAB — CBC WITH DIFFERENTIAL/PLATELET
BASO%: 0.5 % (ref 0.0–2.0)
Basophils Absolute: 0.1 10*3/uL (ref 0.0–0.1)
EOS ABS: 0 10*3/uL (ref 0.0–0.5)
EOS%: 0 % (ref 0.0–7.0)
HCT: 25.4 % — ABNORMAL LOW (ref 34.8–46.6)
HEMOGLOBIN: 8.2 g/dL — AB (ref 11.6–15.9)
LYMPH%: 8.8 % — ABNORMAL LOW (ref 14.0–49.7)
MCH: 29.7 pg (ref 25.1–34.0)
MCHC: 32.3 g/dL (ref 31.5–36.0)
MCV: 92.1 fL (ref 79.5–101.0)
MONO#: 0.9 10*3/uL (ref 0.1–0.9)
MONO%: 5.6 % (ref 0.0–14.0)
NEUT#: 13.6 10*3/uL — ABNORMAL HIGH (ref 1.5–6.5)
NEUT%: 85.1 % — ABNORMAL HIGH (ref 38.4–76.8)
Platelets: 9 10*3/uL — CL (ref 145–400)
RBC: 2.76 10*6/uL — ABNORMAL LOW (ref 3.70–5.45)
RDW: 18.4 % — ABNORMAL HIGH (ref 11.2–14.5)
WBC: 16 10*3/uL — ABNORMAL HIGH (ref 3.9–10.3)
lymph#: 1.4 10*3/uL (ref 0.9–3.3)

## 2015-06-30 LAB — COMPREHENSIVE METABOLIC PANEL (CC13)
ALBUMIN: 2.2 g/dL — AB (ref 3.5–5.0)
ALT: 32 U/L (ref 0–55)
ANION GAP: 6 meq/L (ref 3–11)
AST: 29 U/L (ref 5–34)
Alkaline Phosphatase: 229 U/L — ABNORMAL HIGH (ref 40–150)
BUN: 9.6 mg/dL (ref 7.0–26.0)
CO2: 30 meq/L — AB (ref 22–29)
Calcium: 8.7 mg/dL (ref 8.4–10.4)
Chloride: 105 mEq/L (ref 98–109)
Creatinine: 0.6 mg/dL (ref 0.6–1.1)
GLUCOSE: 123 mg/dL (ref 70–140)
POTASSIUM: 3.5 meq/L (ref 3.5–5.1)
Sodium: 141 mEq/L (ref 136–145)
TOTAL PROTEIN: 5.4 g/dL — AB (ref 6.4–8.3)
Total Bilirubin: 0.52 mg/dL (ref 0.20–1.20)

## 2015-06-30 NOTE — Progress Notes (Signed)
ID: Kristin Hoffman OB: [id number]  MR#: [id number]  IAX#:[id number]  PCP: Drake Leach, MD GYN:  [name] SU: [name] OTHER MD: [name], [name]  CHIEF COMPLAINT:  Stage IV breast cancer  CURRENT TREATMENT: carboplatin/ gemcitabine  BREAST CANCER HISTORY: This patient was previously followed by [name], and was transferred to [name] service as of 08/20/2013.  At the age of 49, the patient had a screening mammogram as a baseline. She had recently given birth and was on birth control pills at that time. The mammogram in November 2006 showed an area of architectural distortion in the left lower inner quadrant. Subsequently an ultrasound was obtained of the left breast showing a 2.0 x 1.5 x 1.4 cm mass, at the 8:00 position, 4 cm from the nipple. A core biopsy performed on 10/21/2005 showed an invasive mammary carcinoma, ER +70%, PR +41%, HER-2/neu negative, with proliferation marker of 38%. [phone])  Breast MRI on 11/08/2005 confirmed a 2.5 cm spiculated mass in the left lower inner quadrant with 3 small satellite nodules adjacent to the primary mass: 3.5 cm posterior medial to the primary mass, measuring 9 mm; 1.5 cm anterior medial to the primary mass measuring 5 mm; and 2 cm medial to the primary mass measuring 5 mm. No axillary adenopathy was noted. No suspicious masses or enhancement are noted in the right breast.  Kristin Hoffman underwentt left lumpectomy under the care of Dr. 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 2 of 23 lymph nodes were involved.  Margins were clear.  She received adjuvant chemotherapy consisting of 6 q. three-week doses of docetaxel/doxorubicin/cyclophosphamide given between January 2007 and may of 2007, with last dose on 04/20/2006. She underwent radiation therapy completed 07/11/2006, after which she began on tamoxifen in early August  2007.  She underwent hysterectomy with bilateral salpingo-oophorectomy on 07/15/2008, and was started on letrozole in October of 2009.  A mammogram 11/10/2009 showed microcalcifications in the left breast, and a subsequent biopsy on 11/18/2009 confirmed invasive mammary carcinoma in the left breast. PET and CT of the chest showed no evidence of metastasis in January 2011.  She underwent bilateral mastectomies 01/25/2010, with the right breast showing no evidence of malignancy; in the left breast showing a 1.0 cm grade 2 invasive ductal carcinoma with high-grade DCIS. Tumor was ER +22%, PR negative, HER-2/neu negative, with proliferation marker of 13%. Margins were clear. Patient underwent concurrent left latissimus flap reconstruction and right implant reconstruction.  She received additional chemotherapy with one cycle of carboplatin/gemcitabine given on 03/11/2010. She then received 5 q. three-week cycles of IV CMF between 03/25/2010 and 06/17/2010.  She was started on exemestane in January 2012 and continued until late November 2013 when she was hospitalized for apparent TIA.   Her subsequent history is as detailed below  INTERVAL HISTORY: Kristin Hoffman returns today for follow up of her metastatic breast cancer, accompanied by her friend. Today is day 15 cycle 2 of 3 cycles planned of carboplatin and gemcitabine given on days 1 and 8 of each cycle. She receives Neupogen on days 2 and 3 and 4 of each cycle and then onpro on day 9. Carboplatin dose has been reduced to AUC 1.5 due to previous thrombocytopenia.   REVIEW OF SYSTEMS: Kristin Hoffman has had bloody drainage from her nose more often this past week and wonders if her platelets are low. She denies fevers, chills, nausea, vomiting,  or changes in bowel or bladder habits. Her appetite is up. She still has not started the oxycodone or oxycontin for pain. She is managing this with 2 ibuprofen and 1 tramadol daily. Her mood is stable, and she continues to hold off on  starting the wellbutrin in light of this. She is on continuous O2 but is actually breathing better. Her energy level is up and down. A detailed review of systems is otherwise stable.  PAST MEDICaL HISTORY: Past Medical History  Diagnosis Date  . Depression   . Reflux   . Hx-TIA (transient ischemic attack) 11/22/2013  . Chronic headaches 11/22/2013  . Anxiety 11/22/2013  . Breast cancer   . Cancer   . Breast cancer metastasized to multiple sites 12/24/2013    PAST SURGICAL HISTORY: Past Surgical History  Procedure Laterality Date  . Mastectomy w/ nodes partial    . Mastectomy    . Breast reconstruction    . Portacath placement      x 2  . Portacath removal      x 2  . Abdominal hysterectomy    . Tee without cardioversion  11/12/2012    Procedure: TRANSESOPHAGEAL ECHOCARDIOGRAM (TEE);  Surgeon: Candee Furbish, MD;  Location: [facility] ENDOSCOPY;  Service: Cardiovascular;  Laterality: N/A;  This TEE may be Dr. Marlou Porch or a LHC Dr    FAMILY HISTORY Both parents are alive and well. Patient has one sister who is 72 years younger and is in good health. No other history of breast or ovarian cancer in the family. Family History  Problem Relation Age of Onset  . Diabetes Mellitus II Neg Hx   . Hypertension Neg Hx     GYNECOLOGIC HISTORY:   (Updated January 2015) G2P2, menarche at age 49 with irregular menses. On birth control pills in the past. On Clomid to induce ovulation with first pregnancy. Also had preeclampsia with first pregnancy. Status post hysterectomy and bilateral salpingo-oophorectomy in August 2009.  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 She is originally from [place]. She's been married to [place], for 13 years. He works as an Pharmacist, hospital at [employer].   ADVANCED DIRECTIVES:  Not in place; Not in place. At the clinic visit 06/30/2015 the patient was given the appropriate forms  to complete and notarize at her discretion.    HEALTH MAINTENANCE:  (Updated 11/22/2013) History  Substance Use Topics  . Smoking status: Never Smoker   . Smokeless tobacco: Never Used  . Alcohol Use: Yes     Comment: rarely     Colonoscopy:  Never  PAP: S/P LAVH/BSO in August 2009  Bone density: Never  Lipid panel: Not on file   Allergies  Allergen Reactions  . Afinitor [Everolimus] Hives and Itching    Current Outpatient Prescriptions  Medication Sig Dispense Refill  . acidophilus (RISAQUAD) CAPS capsule Take 1 capsule by mouth every morning.     Marland Kitchen aspirin EC 81 MG tablet Take 81 mg by mouth daily with breakfast.    . Biotin 5000 MCG CAPS Take 5,000 mcg by mouth every morning.  30 capsule   . cetirizine (ZYRTEC) 10 MG tablet Take 10 mg by mouth at bedtime.     . Cholecalciferol 2000 UNITS CHEW Chew 1 tablet by mouth daily with breakfast.     . escitalopram (LEXAPRO) 20 MG tablet Take 1 tablet (20 mg total) by mouth daily. [place]  tablet 12  . filgrastim (NEUPOGEN) 300 MCG/0.5ML SOSY injection Inject 0.5 mLs (300 mcg total) into the skin once. 6 Syringe 1  . Ibuprofen (ADVIL) 200 MG CAPS Take 2 capsules (400 mg total) by mouth 3 (three) times daily with meals as needed. (Patient taking differently: Take 400 mg by mouth every 6 (six) hours as needed (for pain). ) 120 each 0  . LORazepam (ATIVAN) 0.5 MG tablet TAKE 1 TABLET BY MOUTH AS NEEDED NAUSEA AND VOMITING 30 tablet 0  . Melatonin 10 MG TABS Take 1 tablet by mouth at bedtime.    . montelukast (SINGULAIR) 10 MG tablet Take 10 mg by mouth daily with breakfast.     . Multiple Vitamin (MULTIVITAMIN) tablet Take 1 tablet by mouth every morning.     . nystatin (MYCOSTATIN) 100000 UNIT/ML suspension Take 5 mLs (500,000 Units total) by mouth 4 (four) times daily. 240 mL 1  . omeprazole (PRILOSEC) 40 MG capsule Take 40 mg by mouth every morning.     . ondansetron (ZOFRAN) 8 MG tablet Take 8 mg by mouth 2 (two) times daily. Starts day  after chemo    . tobramycin-dexamethasone (TOBRADEX) ophthalmic solution Place 1 drop into both eyes 2 (two) times daily as needed. 5 mL 0  . traMADol (ULTRAM) 50 MG tablet Take 1 tablet (50 mg total) by mouth every 6 (six) hours as needed. (Patient taking differently: Take 50 mg by mouth every 6 (six) hours as needed for moderate pain. ) 30 tablet 3  . buPROPion (WELLBUTRIN XL) 150 MG 24 hr tablet Take 1 tablet (150 mg total) by mouth daily. (Patient not taking: Reported on 06/23/2015) 30 tablet 12  . docusate sodium (COLACE) 100 MG capsule Take 1 capsule (100 mg total) by mouth 2 (two) times daily. (Patient not taking: Reported on 06/30/2015) 20 capsule 0  . OxyCODONE (OXYCONTIN) 20 mg T12A 12 hr tablet Take 2 tablets (40 mg total) by mouth every 12 (twelve) hours. (Patient not taking: Reported on 06/23/2015) 60 tablet 0  . oxyCODONE-acetaminophen (PERCOCET/ROXICET) 5-325 MG per tablet Take 1 tablet by mouth every 4 (four) hours as needed for moderate pain. (Patient not taking: Reported on 06/23/2015) 30 tablet 0  . polyethylene glycol (MIRALAX / GLYCOLAX) packet Take 17 g by mouth daily. (Patient not taking: Reported on 06/08/2015) 14 each 0  . prochlorperazine (COMPAZINE) 10 MG tablet Take 10 mg by mouth every 6 (six) hours as needed for nausea or vomiting.      No current facility-administered medications for this visit.    OBJECTIVE: Young white Hoffman who appears stated age  37 Vitals:   06/30/15 1438  BP: 126/74  Pulse: 115  Temp: 99.1 F (37.3 C)  Resp: 18  Body mass index is 30.23 kg/(m^2).  ECOG: 2 Filed Weights   06/30/15 1438  Weight: 170 lb 9.6 oz (77.384 kg)    Skin: warm, dry  HEENT: sclerae anicteric, conjunctivae pink, oropharynx clear. No thrush or mucositis.  Lymph Nodes: No cervical or supraclavicular lymphadenopathy  Lungs: clear to auscultation bilaterally, no rales, wheezes, or rhonci  Heart: regular rate and rhythm  Abdomen: round, soft, non tender, positive  bowel sounds  Musculoskeletal: No focal spinal tenderness, no peripheral edema  Neuro: non focal, well oriented, positive affect  Breasts: deferred  LAB RESULTS:   Lab Results  Component Value Date   WBC 16.0* 06/30/2015   NEUTROABS 13.6* 06/30/2015   HGB 8.2* 06/30/2015   HCT 25.4* 06/30/2015   MCV  92.1 06/30/2015   PLT 9* 06/30/2015      Chemistry      Component Value Date/Time   NA 141 06/30/2015 1425   NA 138 05/30/2015 0525   K 3.5 06/30/2015 1425   K 3.7 05/30/2015 0525   CL 104 05/30/2015 0525   CL 104 11/14/2012 1407   CO2 30* 06/30/2015 1425   CO2 27 05/30/2015 0525   BUN 9.6 06/30/2015 1425   BUN 16 05/30/2015 0525   CREATININE 0.6 06/30/2015 1425   CREATININE 0.68 05/30/2015 0525      Component Value Date/Time   CALCIUM 8.7 06/30/2015 1425   CALCIUM 8.2* 05/30/2015 0525   ALKPHOS 229* 06/30/2015 1425   ALKPHOS 88 05/30/2015 0525   AST 29 06/30/2015 1425   AST 193* 05/30/2015 0525   ALT 32 06/30/2015 1425   ALT 76* 05/30/2015 0525   BILITOT 0.52 06/30/2015 1425   BILITOT 1.0 05/30/2015 0525      STUDIES: Dg Chest 1 View  06/05/2015   CLINICAL DATA:  Status post left thoracentesis  EXAM: CHEST  1 VIEW  COMPARISON:  05/29/2015  FINDINGS: Cardiac shadow is within normal limits. The right chest wall port is again seen. Right basilar mass is again seen and stable. There is been interval left-sided thoracentesis with decrease in the appearance of the left-sided pleural effusion. No pneumothorax is noted. There remains a loculated component superiorly.  IMPRESSION: No pneumothorax following thoracentesis on the left. Persistent right basilar mass lesion and loculated component of pleural fluid on the left is noted.   Electronically Signed   By: Inez Catalina M.D.   On: 06/05/2015 15:16   Ir Patient Eval Tech 0-60 Mins  06/12/2015   Patient Information   Patient Name Sex DOB SSN  Giamarie, Bueche Female 1966/05/29 [id number]  Progress Notes by Markus Daft, MD at  06/11/2015 5:26 PM   Author: Markus Daft, MD Service: Radiology Author Type: Physician  Filed: 06/11/2015 5:35 PM Note Time: 06/11/2015 5:26 PM Status: Signed  Editor: Markus Daft, MD (Physician)    Expand All Collapse All    Patient ID: Kristin Hoffman, female DOB: March 06, 1966, 49 y.o. MRN: [id number] Patient was scheduled for a tunneled left pleural catheter in interventional radiology. I discussed the tunneled pleural catheter with the patient and her father in depth. I also evaluated the patient's left chest with ultrasound. The patient has a complex or loculated collection along the anterior left chest which is not amenable to percutaneous drainage. The patient has had previous thoracentesis procedures in the past but these were performed in the posterior left chest. Patient has minimal fluid in this area based on ultrasound today. I reviewed the ultrasound images and previous CT images with the patient. I am concerned that the patient may not tolerate a tunneled pleural catheter because she has had severe pain and discomfort with ultrasound guided thoracentesis procedures in the past. Fortunately, the patient's pleural fluid has not reaccumulated as fast as it has in the past. At this time, we will continue ultrasound-guided thoracentesis procedures as needed. We may need to consider using moderate sedation in the future because the patient has some much discomfort with thoracentesis procedures.       US Thoracentesis Asp Pleural Space W/img Guide  06/05/2015   INDICATION: Symptomatic recurrent left sided pleural effusion  EXAM: US THORACENTESIS ASP PLEURAL SPACE W/IMG GUIDE  COMPARISON:  05/29/15 thoracentesis.  MEDICATIONS: None  COMPLICATIONS: None immediate  TECHNIQUE: Informed written consent was obtained  from the patient after a discussion of the risks, benefits and alternatives to treatment. A timeout was performed prior to the initiation of the procedure.  Initial ultrasound scanning demonstrates a  left pleural effusion. The lower chest was prepped and draped in the usual sterile fashion. 1% lidocaine was used for local anesthesia.  Under direct ultrasound guidance, a 19 gauge, 7-cm, Yueh catheter was introduced. An ultrasound image was saved for documentation purposes. The thoracentesis was performed. The catheter was removed and a dressing was applied. The patient tolerated the procedure well without immediate post procedural complication. The patient was escorted to have an upright chest radiograph.  FINDINGS: A total of approximately 1.2 liters of amber colored fluid was removed.  IMPRESSION: Successful ultrasound-guided left sided thoracentesis yielding 1.2 liters of pleural fluid.  Read By:  Tsosie Billing PA-C   Electronically Signed   By: Sandi Mariscal M.D.   On: 06/05/2015 15:35     ASSESSMENT: 49 y.o. Kristin Hoffman  (1)  status post left lumpectomy under the care of Dr. 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 2 of 23 lymph nodes were involved.  Margins were clear.  (2) status post adjuvant chemotherapy consisting of 6 q. three-week doses of docetaxel/ doxorubicin/ cyclophosphamide given between January 2007 and may of 2007, with last dose on 04/20/2006.  (3) status post radiation therapy to the left breast, completed 07/11/2006  (4) began on tamoxifen in early August 2007 and continued until October 2009. She status post hysterectomy with bilateral salpingo-oophorectomy on 07/15/2008, and was started on letrozole in October of 2009.  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 PET and CT of the chest showed no evidence of metastasis in January 2011.  (6) status post bilateral mastectomies 01/25/2010, with the right breast showing no evidence of malignancy; in the  left breast showing a 1.0 cm grade 2 invasive ductal carcinoma,  ER +22%, PR negative, HER-2/neu negative, with proliferation marker of 13%. Margins were clear. Patient underwent concurrent left latissimus flap reconstruction and right implant reconstruction.  (7)  status post additional chemotherapy with one cycle of carboplatin/gemcitabine given on 03/11/2010. She then received 5 q. three-week cycles of IV CMF between 03/25/2010 and 06/17/2010.  (8) started on exemestane in January 2012 and continued until late November 2013 when she was hospitalized for apparent TIA at which time her exemestane was stopped and not resumed  (9) METASTATIC DISEASE: evidence of disease recurence noted on chest CT, liver MRI and PET scan in December 2014, with suspicious lung nodules, lymphadenopathy, and 3 lesions in the right lobe of the liver, but no evidence of bony disease and no evidence of brain involvement by brain MRI September 2014. Biopsy of a left supraclavicular lymph node on 12/11/2013 confirmed metastatic carcinoma, estrogen receptor 45% positive, progesterone receptor 17% positive, with no HER-2 amplification.  (10) fulvestrant started 12/03/2013, discontinued 03/25/2014 with progression  (11) exemestane/ everolimus started 04/04/2014; everolimus held 04/14/2014, with skin rash and mouth soreness; resumed at 5 mg a day as of 04/29/2014, discontinued 05/09/2014 with similar symptoms  (12) continueing exemestane, adding palbociclib 05/26/2014;   (a) palbociclib dose dropped to 75 mg every other day for 21 days beginning with cycle 4  (b) exemestane and Palbociclib discontinued December 2015 with evidence of progression  (13) [facility] referral placed 04/28/2049 --  re-sent 05/23/2014-- shows Pi3KCA and TP53 mutations  (a) [facility] one test requested 12/10/2014-- confirms [employer] results  (b) did not qualify for capecitabine/ BYL 719 trial because of elevated lipase  (14) started eribulin 12/26/2014,  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   (a) dpse dropped from 2g BID to 1.5g BID starting on 04/26/15  (b) stopped 05/17/2015 (after 4 cycles) with progression  (16) started carboplatin/ gemcitabine 05/26/2015, to be given day one and day 8 of each 21 day cycle  (a) given her history of severe neutropenia with this chemotherapy we'll do Neupogen days 2, 3 and 4 of each cycle and onpro day 9  (b) carbo dose dropped by 25% w cycle 2 due to very low platelet nadir  (17) Left pleural effusion\   (a) s/p L thoracentesis 05/22/2015, 05/29/2015 and 06/05/2015   PLAN:  The labs were reviewed in detail and she is both anemic and thrombocytopenic with an hgb of 8.2 and platelet count of 9. I consulted this case with [name] and he agreed she is due for a blood and platelet infusion. I have placed the appropriate orders and she will return for this on Thursday. [name] suggests she need a further reduction in her carboplatin dose because of this repeat thrombocytopenic event. He will manage this at their next visit.  Otherwise, Kristin Hoffman is doing well today. She will continue to manage her pain with tramadol and ibuprofen alone. She is not interested in starting the wellbutrin at this time.   Kristin Hoffman will return next week for cycle 3 of carboplatin and gemcitabine. She understands and agrees with this plan. She knows the goal of treatment in her case is control. She has been encouraged to call with any issues that might arise before her next visit here.   Laurie Panda, NP   06/30/2015 4:59 PM

## 2015-06-30 NOTE — Telephone Encounter (Signed)
Faxed order for ativan to [facility] in [place]

## 2015-06-30 NOTE — Telephone Encounter (Signed)
Gave avs & calendar for July/Augsut

## 2015-07-01 LAB — PREPARE RBC (CROSSMATCH)

## 2015-07-02 ENCOUNTER — Ambulatory Visit (HOSPITAL_BASED_OUTPATIENT_CLINIC_OR_DEPARTMENT_OTHER): Payer: 59

## 2015-07-02 VITALS — BP 116/68 | HR 87 | Temp 98.9°F | Resp 18

## 2015-07-02 DIAGNOSIS — C50919 Malignant neoplasm of unspecified site of unspecified female breast: Secondary | ICD-10-CM | POA: Diagnosis not present

## 2015-07-02 DIAGNOSIS — D630 Anemia in neoplastic disease: Secondary | ICD-10-CM | POA: Diagnosis not present

## 2015-07-02 DIAGNOSIS — C799 Secondary malignant neoplasm of unspecified site: Secondary | ICD-10-CM

## 2015-07-02 DIAGNOSIS — C50312 Malignant neoplasm of lower-inner quadrant of left female breast: Secondary | ICD-10-CM | POA: Diagnosis not present

## 2015-07-02 DIAGNOSIS — D696 Thrombocytopenia, unspecified: Secondary | ICD-10-CM

## 2015-07-02 MED ORDER — ACETAMINOPHEN 325 MG PO TABS
ORAL_TABLET | ORAL | Status: AC
  Filled 2015-07-02: qty 2

## 2015-07-02 MED ORDER — SODIUM CHLORIDE 0.9 % IJ SOLN
10.0000 mL | INTRAMUSCULAR | Status: AC | PRN
  Administered 2015-07-02: 10 mL
  Filled 2015-07-02: qty 10

## 2015-07-02 MED ORDER — HEPARIN SOD (PORK) LOCK FLUSH 100 UNIT/ML IV SOLN
500.0000 [IU] | Freq: Every day | INTRAVENOUS | Status: AC | PRN
  Administered 2015-07-02: 500 [IU]
  Filled 2015-07-02: qty 5

## 2015-07-02 MED ORDER — SODIUM CHLORIDE 0.9 % IV SOLN
250.0000 mL | Freq: Once | INTRAVENOUS | Status: AC
Start: 2015-07-02 — End: 2015-07-02
  Administered 2015-07-02: 250 mL via INTRAVENOUS

## 2015-07-02 MED ORDER — DIPHENHYDRAMINE HCL 25 MG PO CAPS
ORAL_CAPSULE | ORAL | Status: AC
  Filled 2015-07-02: qty 1

## 2015-07-02 MED ORDER — ACETAMINOPHEN 325 MG PO TABS
650.0000 mg | ORAL_TABLET | Freq: Once | ORAL | Status: AC
  Administered 2015-07-02: 650 mg via ORAL

## 2015-07-02 MED ORDER — DIPHENHYDRAMINE HCL 25 MG PO CAPS
25.0000 mg | ORAL_CAPSULE | Freq: Once | ORAL | Status: AC
Start: 2015-07-02 — End: 2015-07-02
  Administered 2015-07-02: 25 mg via ORAL

## 2015-07-02 NOTE — Patient Instructions (Signed)

## 2015-07-03 LAB — TYPE AND SCREEN
ABO/RH(D): A POS
Antibody Screen: NEGATIVE
Unit division: 0
Unit division: 0

## 2015-07-03 LAB — PREPARE PLATELET PHERESIS: Unit division: 0

## 2015-07-06 ENCOUNTER — Other Ambulatory Visit: Payer: Self-pay | Admitting: Oncology

## 2015-07-07 ENCOUNTER — Telehealth: Payer: Self-pay | Admitting: Oncology

## 2015-07-07 ENCOUNTER — Ambulatory Visit (HOSPITAL_BASED_OUTPATIENT_CLINIC_OR_DEPARTMENT_OTHER): Payer: 59 | Admitting: Oncology

## 2015-07-07 ENCOUNTER — Ambulatory Visit (HOSPITAL_BASED_OUTPATIENT_CLINIC_OR_DEPARTMENT_OTHER): Payer: 59

## 2015-07-07 ENCOUNTER — Other Ambulatory Visit (HOSPITAL_BASED_OUTPATIENT_CLINIC_OR_DEPARTMENT_OTHER): Payer: 59

## 2015-07-07 ENCOUNTER — Other Ambulatory Visit: Payer: Self-pay | Admitting: *Deleted

## 2015-07-07 VITALS — BP 113/71 | HR 82 | Temp 98.1°F | Resp 18 | Ht 63.0 in | Wt 171.7 lb

## 2015-07-07 DIAGNOSIS — J948 Other specified pleural conditions: Secondary | ICD-10-CM | POA: Diagnosis not present

## 2015-07-07 DIAGNOSIS — C50312 Malignant neoplasm of lower-inner quadrant of left female breast: Secondary | ICD-10-CM

## 2015-07-07 DIAGNOSIS — J90 Pleural effusion, not elsewhere classified: Secondary | ICD-10-CM

## 2015-07-07 DIAGNOSIS — C760 Malignant neoplasm of head, face and neck: Secondary | ICD-10-CM | POA: Diagnosis not present

## 2015-07-07 DIAGNOSIS — Z8673 Personal history of transient ischemic attack (TIA), and cerebral infarction without residual deficits: Secondary | ICD-10-CM

## 2015-07-07 DIAGNOSIS — C50919 Malignant neoplasm of unspecified site of unspecified female breast: Secondary | ICD-10-CM

## 2015-07-07 DIAGNOSIS — Z5111 Encounter for antineoplastic chemotherapy: Secondary | ICD-10-CM | POA: Diagnosis not present

## 2015-07-07 DIAGNOSIS — D630 Anemia in neoplastic disease: Secondary | ICD-10-CM | POA: Diagnosis not present

## 2015-07-07 LAB — CBC WITH DIFFERENTIAL/PLATELET
BASO%: 0.2 % (ref 0.0–2.0)
BASOS ABS: 0 10*3/uL (ref 0.0–0.1)
EOS%: 0.8 % (ref 0.0–7.0)
Eosinophils Absolute: 0.1 10*3/uL (ref 0.0–0.5)
HEMATOCRIT: 35.8 % (ref 34.8–46.6)
HGB: 11.1 g/dL — ABNORMAL LOW (ref 11.6–15.9)
LYMPH%: 11.9 % — AB (ref 14.0–49.7)
MCH: 28.4 pg (ref 25.1–34.0)
MCHC: 31 g/dL — ABNORMAL LOW (ref 31.5–36.0)
MCV: 91.6 fL (ref 79.5–101.0)
MONO#: 1.3 10*3/uL — ABNORMAL HIGH (ref 0.1–0.9)
MONO%: 14 % (ref 0.0–14.0)
NEUT#: 6.9 10*3/uL — ABNORMAL HIGH (ref 1.5–6.5)
NEUT%: 73.1 % (ref 38.4–76.8)
Platelets: 252 10*3/uL (ref 145–400)
RBC: 3.91 10*6/uL (ref 3.70–5.45)
RDW: 20.6 % — ABNORMAL HIGH (ref 11.2–14.5)
WBC: 9.5 10*3/uL (ref 3.9–10.3)
lymph#: 1.1 10*3/uL (ref 0.9–3.3)

## 2015-07-07 LAB — COMPREHENSIVE METABOLIC PANEL (CC13)
ALBUMIN: 2.4 g/dL — AB (ref 3.5–5.0)
ALK PHOS: 180 U/L — AB (ref 40–150)
ALT: 22 U/L (ref 0–55)
ANION GAP: 10 meq/L (ref 3–11)
AST: 30 U/L (ref 5–34)
BILIRUBIN TOTAL: 0.63 mg/dL (ref 0.20–1.20)
BUN: 10.5 mg/dL (ref 7.0–26.0)
CHLORIDE: 105 meq/L (ref 98–109)
CO2: 27 mEq/L (ref 22–29)
Calcium: 9.2 mg/dL (ref 8.4–10.4)
Creatinine: 0.8 mg/dL (ref 0.6–1.1)
EGFR: >90 mL/min/{1.73_m2} (ref >90)
GLUCOSE: 112 mg/dL (ref 70–140)
POTASSIUM: 4.1 meq/L (ref 3.5–5.1)
SODIUM: 143 meq/L (ref 136–145)
Total Protein: 5.9 g/dL — ABNORMAL LOW (ref 6.4–8.3)

## 2015-07-07 MED ORDER — SODIUM CHLORIDE 0.9 % IV SOLN
Freq: Once | INTRAVENOUS | Status: AC
  Administered 2015-07-07: 09:00:00 via INTRAVENOUS

## 2015-07-07 MED ORDER — SODIUM CHLORIDE 0.9 % IV SOLN
Freq: Once | INTRAVENOUS | Status: AC
  Administered 2015-07-07: 09:00:00 via INTRAVENOUS
  Filled 2015-07-07: qty 4

## 2015-07-07 MED ORDER — SODIUM CHLORIDE 0.9 % IJ SOLN
10.0000 mL | INTRAMUSCULAR | Status: DC | PRN
  Administered 2015-07-07: 10 mL
  Filled 2015-07-07: qty 10

## 2015-07-07 MED ORDER — HEPARIN SOD (PORK) LOCK FLUSH 100 UNIT/ML IV SOLN
500.0000 [IU] | Freq: Once | INTRAVENOUS | Status: AC | PRN
  Administered 2015-07-07: 500 [IU]
  Filled 2015-07-07: qty 5

## 2015-07-07 MED ORDER — SODIUM CHLORIDE 0.9 % IV SOLN
800.0000 mg/m2 | Freq: Once | INTRAVENOUS | Status: AC
  Administered 2015-07-07: 1520 mg via INTRAVENOUS
  Filled 2015-07-07: qty 39.98

## 2015-07-07 MED ORDER — SODIUM CHLORIDE 0.9 % IV SOLN
200.0000 mg | Freq: Once | INTRAVENOUS | Status: AC
  Administered 2015-07-07: 200 mg via INTRAVENOUS
  Filled 2015-07-07: qty 20

## 2015-07-07 NOTE — Telephone Encounter (Signed)
Appointments adjusted,she is to get neupogen this week and just having onpro next week.  Patient is aware to get a new avs at end of treatment today

## 2015-07-07 NOTE — Patient Instructions (Signed)
  [facility] Discharge Instructions for Patients Receiving Chemotherapy  Today you received the following chemotherapy agents Gemzar/Carboplatin To help prevent nausea and vomiting after your treatment, we encourage you to take your nausea medication as prescribed.  If you develop nausea and vomiting that is not controlled by your nausea medication, call the clinic.   BELOW ARE SYMPTOMS THAT SHOULD BE REPORTED IMMEDIATELY:  *FEVER GREATER THAN 100.5 F  *CHILLS WITH OR WITHOUT FEVER  NAUSEA AND VOMITING THAT IS NOT CONTROLLED WITH YOUR NAUSEA MEDICATION  *UNUSUAL SHORTNESS OF BREATH  *UNUSUAL BRUISING OR BLEEDING  TENDERNESS IN MOUTH AND THROAT WITH OR WITHOUT PRESENCE OF ULCERS  *URINARY PROBLEMS  *BOWEL PROBLEMS  UNUSUAL RASH Items with * indicate a potential emergency and should be followed up as soon as possible.  Feel free to call the clinic you have any questions or concerns. The clinic phone number is [phone].  Please show the CHEMO ALERT CARD at check-in to the Emergency Department and triage nurse.   

## 2015-07-07 NOTE — Progress Notes (Signed)
ID: Kristin Hoffman OB: [birth year]  MR#: [id number]  OZD#:[id number]  PCP: Kristin Leach, MD GYN:  Kristin Hoffman SU: Kristin Hoffman OTHER MD: Kristin Hoffman, Kristin Hoffman  CHIEF COMPLAINT:  Stage IV breast cancer  CURRENT TREATMENT: carboplatin/ gemcitabine  BREAST CANCER HISTORY: This patient was previously followed by Kristin Hoffman, and was transferred to Kristin Hoffman service as of 08/20/2013.  At the age of 49, the patient had a screening mammogram as a baseline. She had recently given birth and was on birth control pills at that time. The mammogram in November 2006 showed an area of architectural distortion in the left lower inner quadrant. Subsequently an ultrasound was obtained of the left breast showing a 2.0 x 1.5 x 1.4 cm mass, at the 8:00 position, 4 cm from the nipple. A core biopsy performed on 10/21/2005 showed an invasive mammary carcinoma, ER +70%, PR +41%, HER-2/neu negative, with proliferation marker of 38%. [phone])  Breast MRI on 11/08/2005 confirmed a 2.5 cm spiculated mass in the left lower inner quadrant with 3 small satellite nodules adjacent to the primary mass: 3.5 cm posterior medial to the primary mass, measuring 9 mm; 1.5 cm anterior medial to the primary mass measuring 5 mm; and 2 cm medial to the primary mass measuring 5 mm. No axillary adenopathy was noted. No suspicious masses or enhancement are noted in the right breast.  Kristin Hoffman left lumpectomy under the care of Kristin Hoffman on 11/18/2005 for a 2.5 cm grade 3 invasive ductal carcinoma, ER +70%, PR +41%, HER-2/neu negative, with proliferation marker of 38%. 2 of 23 lymph nodes were involved.  Margins were clear.  She received adjuvant chemotherapy consisting of 6 q. three-week doses of docetaxel/doxorubicin/cyclophosphamide given between January 2007 and may of 2007, with last dose on 04/20/2006. She underwent radiation therapy completed 07/11/2006, after which she began on tamoxifen in early August  2007.  She underwent hysterectomy with bilateral salpingo-oophorectomy on 07/15/2008, and was started on letrozole in October of 2009.  A mammogram 11/10/2009 showed microcalcifications in the left breast, and a subsequent biopsy on 11/18/2009 confirmed invasive mammary carcinoma in the left breast. PET and CT of the chest showed no evidence of metastasis in January 2011.  She underwent bilateral mastectomies 01/25/2010, with the right breast showing no evidence of malignancy; in the left breast showing a 1.0 cm grade 2 invasive ductal carcinoma with high-grade DCIS. Tumor was ER +22%, PR negative, HER-2/neu negative, with proliferation marker of 13%. Margins were clear. Patient underwent concurrent left latissimus flap reconstruction and right implant reconstruction.  She received additional chemotherapy with one cycle of carboplatin/gemcitabine given on 03/11/2010. She then received 5 q. three-week cycles of IV CMF between 03/25/2010 and 06/17/2010.  She was started on exemestane in January 2012 and continued until late November 2013 when she was hospitalized for apparent TIA.   Her subsequent history is as detailed below  INTERVAL HISTORY: Kristin Hoffman returns today for follow up of her metastatic breast cancer, accompanied by her father. Today is day 1 cycle 3 of 3 planned cycles planned of carboplatin and gemcitabine given on days 1 and 8 of each cycle. She receives Neupogen on days 2,  3 and 4 of each cycle and then onpro on day 9. Carboplatin dose has been reduced to AUC 1.5 due to previous thrombocytopenia.   REVIEW OF SYSTEMS: Kristin Hoffman shows some clinical evidence of improvement. She is doing more at home. For example she is doing some laundry and some cooking, which she  was not able to do before. Her pain, which is across the chest wall bilaterally is well-controlled on 1 tramadol and 2 Advil every 6 hours. She is not constipated from this. She occasionally uses prune juice but mostly she has a daily  soft bowel movements without any difficulty. She tells me her appetite is good and her sense of taste is mostly okay. After she gets the onpro on day 9, on days 10 and 11 she feels very tired and just sleeps for a couple of days. She doesn't have any problem with the Neulasta. She is using her oxygen most of the time but lately she has been off the oxygen, for a few hours, without being too active, and has been comfortable with that. She sleeps flat without difficulty. She now can take a deep breath without pleurisy. Her cough is minimal. A detailed review of systems today was otherwise stable  PAST MEDICaL HISTORY: Past Medical History  Diagnosis Date  . Depression   . Reflux   . Hx-TIA (transient ischemic attack) 11/22/2013  . Chronic headaches 11/22/2013  . Anxiety 11/22/2013  . Breast cancer   . Cancer   . Breast cancer metastasized to multiple sites 12/24/2013    PAST SURGICAL HISTORY: Past Surgical History  Procedure Laterality Date  . Mastectomy w/ nodes partial    . Mastectomy    . Breast reconstruction    . Portacath placement      x 2  . Portacath removal      x 2  . Abdominal hysterectomy    . Tee without cardioversion  11/12/2012    Procedure: TRANSESOPHAGEAL ECHOCARDIOGRAM (TEE);  Surgeon: Kristin Furbish, MD;  Location: [facility] ENDOSCOPY;  Service: Cardiovascular;  Laterality: N/A;  This TEE may be Kristin Hoffman or a LHC Dr    FAMILY HISTORY Both parents are alive and well. Patient has one sister who is 28 years younger and is in good health. No other history of breast or ovarian cancer in the family. Family History  Problem Relation Age of Onset  . Diabetes Mellitus II Neg Hx   . Hypertension Neg Hx     GYNECOLOGIC HISTORY:   (Updated January 2015) G2P2, menarche at age 49 with irregular menses. On birth control pills in the past. On Clomid to induce ovulation with first pregnancy. Also had preeclampsia with first pregnancy. Status post hysterectomy and bilateral  salpingo-oophorectomy in August 2009.  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 She is originally from [place]. She's been married to Elberta, for 13 years. He works as an Pharmacist, hospital at [employer].   ADVANCED DIRECTIVES:  Not in place; Not in place. At the clinic visit 07/07/2015 the patient was given the appropriate forms to complete and notarize at her discretion.    HEALTH MAINTENANCE:  (Updated 11/22/2013) History  Substance Use Topics  . Smoking status: Never Smoker   . Smokeless tobacco: Never Used  . Alcohol Use: Yes     Comment: rarely     Colonoscopy:  Never  PAP: S/P LAVH/BSO in August 2009  Bone density: Never  Lipid panel: Not on file   Allergies  Allergen Reactions  . Afinitor [Everolimus] Hives and Itching    Current Outpatient Prescriptions  Medication Sig Dispense Refill  . acidophilus (RISAQUAD) CAPS capsule Take 1 capsule by mouth every morning.     Marland Kitchen aspirin EC 81 MG  tablet Take 81 mg by mouth daily with breakfast.    . Biotin 5000 MCG CAPS Take 5,000 mcg by mouth every morning.  30 capsule   . buPROPion (WELLBUTRIN XL) 150 MG 24 hr tablet Take 1 tablet (150 mg total) by mouth daily. (Patient not taking: Reported on 06/23/2015) 30 tablet 12  . cetirizine (ZYRTEC) 10 MG tablet Take 10 mg by mouth at bedtime.     . Cholecalciferol 2000 UNITS CHEW Chew 1 tablet by mouth daily with breakfast.     . docusate sodium (COLACE) 100 MG capsule Take 1 capsule (100 mg total) by mouth 2 (two) times daily. (Patient not taking: Reported on 06/30/2015) 20 capsule 0  . escitalopram (LEXAPRO) 20 MG tablet Take 1 tablet (20 mg total) by mouth daily. 30 tablet 12  . filgrastim (NEUPOGEN) 300 MCG/0.5ML SOSY injection Inject 0.5 mLs (300 mcg total) into the skin once. 6 Syringe 1  . Ibuprofen (ADVIL) 200 MG CAPS Take 2 capsules (400 mg total) by mouth 3 (three) times daily with  meals as needed. (Patient taking differently: Take 400 mg by mouth every 6 (six) hours as needed (for pain). ) 120 each 0  . LORazepam (ATIVAN) 0.5 MG tablet TAKE 1 TABLET BY MOUTH AS NEEDED NAUSEA AND VOMITING 30 tablet 0  . Melatonin 10 MG TABS Take 1 tablet by mouth at bedtime.    . montelukast (SINGULAIR) 10 MG tablet Take 10 mg by mouth daily with breakfast.     . Multiple Vitamin (MULTIVITAMIN) tablet Take 1 tablet by mouth every morning.     . nystatin (MYCOSTATIN) 100000 UNIT/ML suspension Take 5 mLs (500,000 Units total) by mouth 4 (four) times daily. 240 mL 1  . omeprazole (PRILOSEC) 40 MG capsule Take 40 mg by mouth every morning.     . ondansetron (ZOFRAN) 8 MG tablet Take 8 mg by mouth 2 (two) times daily. Starts day after chemo    . prochlorperazine (COMPAZINE) 10 MG tablet Take 10 mg by mouth every 6 (six) hours as needed for nausea or vomiting.     . traMADol (ULTRAM) 50 MG tablet TAKE 1 TABLET BY MOUTH EVERY 6 HOURS AS NEEDED 30 tablet 1   No current facility-administered medications for this visit.    OBJECTIVE: Young white woman in no acute distress  Filed Vitals:   07/07/15 0900  BP: 113/71  Pulse: 82  Temp: 98.1 F (36.7 C)  Resp: 18  Body mass index is 30.42 kg/(m^2).  ECOG: 2 Filed Weights   07/07/15 0900  Weight: 171 lb 11.2 oz (77.883 kg)    Sclerae unicteric, pupils round and equal Oropharynx clear and moist-- no thrush or other lesions No cervical or supraclavicular adenopathy Lungs no rales or rhonchi Heart regular rate and rhythm Abd soft, nontender, positive bowel sounds MSK no focal spinal tenderness, no upper extremity lymphedema Neuro: nonfocal, well oriented, appropriate affect Breasts: deferred    LAB RESULTS:   Lab Results  Component Value Date   WBC 9.5 07/07/2015   NEUTROABS 6.9* 07/07/2015   HGB 11.1* 07/07/2015   HCT 35.8 07/07/2015   MCV 91.6 07/07/2015   PLT 252 07/07/2015      Chemistry      Component Value Date/Time    NA 143 07/07/2015 0808   NA 138 05/30/2015 0525   K 4.1 07/07/2015 0808   K 3.7 05/30/2015 0525   CL 104 05/30/2015 0525   CL 104 11/14/2012 1407   CO2 27 07/07/2015 6144  CO2 27 05/30/2015 0525   BUN 10.5 07/07/2015 0808   BUN 16 05/30/2015 0525   CREATININE 0.8 07/07/2015 0808   CREATININE 0.68 05/30/2015 0525      Component Value Date/Time   CALCIUM 9.2 07/07/2015 0808   CALCIUM 8.2* 05/30/2015 0525   ALKPHOS 180* 07/07/2015 0808   ALKPHOS 88 05/30/2015 0525   AST 30 07/07/2015 0808   AST 193* 05/30/2015 0525   ALT 22 07/07/2015 0808   ALT 76* 05/30/2015 0525   BILITOT 0.63 07/07/2015 0808   BILITOT 1.0 05/30/2015 0525      STUDIES: Ir Patient Eval Tech 0-60 Mins  06/12/2015   Patient Information   Patient Name Sex DOB SSN  Kristin Hoffman, Kristin Hoffman Female May 06, 1966 [id number]  Progress Notes by Markus Daft, MD at 06/11/2015 5:26 PM   Author: Markus Daft, MD Service: Radiology Author Type: Physician  Filed: 06/11/2015 5:35 PM Note Time: 06/11/2015 5:26 PM Status: Signed  Editor: Markus Daft, MD (Physician)    Expand All Collapse All    Patient ID: Kristin Hoffman, female DOB: July 13, 1966, 49 y.o. MRN: [id number] Patient was scheduled for a tunneled left pleural catheter in interventional radiology. I discussed the tunneled pleural catheter with the patient and her father in depth. I also evaluated the patient's left chest with ultrasound. The patient has a complex or loculated collection along the anterior left chest which is not amenable to percutaneous drainage. The patient has had previous thoracentesis procedures in the past but these were performed in the posterior left chest. Patient has minimal fluid in this area based on ultrasound today. I reviewed the ultrasound images and previous CT images with the patient. I am concerned that the patient may not tolerate a tunneled pleural catheter because she has had severe pain and discomfort with ultrasound guided thoracentesis procedures in the  past. Fortunately, the patient's pleural fluid has not reaccumulated as fast as it has in the past. At this time, we will continue ultrasound-guided thoracentesis procedures as needed. We may need to consider using moderate sedation in the future because the patient has some much discomfort with thoracentesis procedures.         ASSESSMENT: 49 y.o. 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 2 of 23 lymph nodes were involved.  Margins were clear.  (2) status post adjuvant chemotherapy consisting of 6 q. three-week doses of docetaxel/ doxorubicin/ cyclophosphamide given between January 2007 and may of 2007, with last dose on 04/20/2006.  (3) status post radiation therapy to the left breast, completed 07/11/2006  (4) began on tamoxifen in early August 2007 and continued until October 2009. She status post hysterectomy with bilateral salpingo-oophorectomy on 07/15/2008, and was started on letrozole in October of 2009.  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 PET and CT of the chest showed no evidence of metastasis in January 2011.  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 Margins were clear. Patient underwent concurrent left latissimus flap reconstruction and right implant reconstruction.  (7)  status post additional chemotherapy with one cycle  of carboplatin/gemcitabine given on 03/11/2010. She then received 5 q. three-week cycles of IV CMF between 03/25/2010 and 06/17/2010.  (8) started on  exemestane in January 2012 and continued until late November 2013 when she was hospitalized for apparent TIA at which time her exemestane was stopped and not resumed  (9) METASTATIC DISEASE: evidence of disease recurence noted on chest CT, liver MRI and PET scan in December 2014, with suspicious lung nodules, lymphadenopathy, and 3 lesions in the right lobe of the liver, but no evidence of bony disease and no evidence of brain involvement by brain MRI September 2014. Biopsy of a left supraclavicular lymph node on 12/11/2013 confirmed metastatic carcinoma, estrogen receptor 45% positive, progesterone receptor 17% positive, with no HER-2 amplification.  (10) fulvestrant started 12/03/2013, discontinued 03/25/2014 with progression  (11) exemestane/ everolimus started 04/04/2014; everolimus held 04/14/2014, with skin rash and mouth soreness; resumed at 5 mg a day as of 04/29/2014, discontinued 05/09/2014 with similar symptoms  (12) continueing exemestane, adding palbociclib 05/26/2014;   (a) palbociclib dose dropped to 75 mg every other day for 21 days beginning with cycle 4  (b) exemestane and Palbociclib discontinued December 2015 with evidence of progression  (13) [facility] referral placed 04/28/2049 -- re-sent 05/23/2014-- shows Pi3KCA and TP53 mutations  (a) [facility] one test requested 12/10/2014-- confirms [employer] results  (b) did not qualify for capecitabine/ BYL 719 trial because of elevated lipase  (14) started eribulin 12/26/2014, 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   (a) dpse dropped from 2g BID to 1.5g BID starting on 04/26/15  (b) stopped 05/17/2015 (after 4 cycles) with progression  (16) started carboplatin/ gemcitabine 05/26/2015, to be given day one and day 8 of each 21 day cycle  (a) given her history of  severe neutropenia with this chemotherapy we'll do Neupogen days 2, 3 and 4 of each cycle and onpro day 9  (b) carbo dose dropped by 25% w cycle 2 due to very low platelet nadir  (17) Left pleural effusion\   (a) s/p L thoracentesis 05/22/2015, 05/29/2015 and 06/05/2015  (b) attempt at pleurx 06/12/2015 cancelled as effusion loculated and scant   PLAN:  Cristen is showing some clinical evidence of response, with improved functional status and better controlled symptoms. We are proceeding with cycle 3 today. She will receive Neulasta on days 2, 3 and 4. She would like her chemotherapy next week moved to Monday so she feels better at her son's  birthday later that week and I have changed those orders accordingly.  I have set her up for repeat CTs of the chest abdomen and pelvis 07/27/2015. I will see her the next day. If as expected we see evidence of response or at least stable disease we will continue with the current treatment. Otherwise we will again have to address end-of-life questions and alternative treatments. Judeen Hammans is very eager to have the scans because like me she expects good news.  I am setting her up for lab work on August 5 with likely need for transfusion and I have scheduled her for transfusion on the eighth.  She knows to call for any other problems that may develop before her next visit here.   Chauncey Cruel, MD   07/07/2015 9:06 AM

## 2015-07-07 NOTE — Telephone Encounter (Signed)
Appointments made and avs printed for patient,contrast to patient ans email to [facility] to move 8/2 to 8/1 chemo

## 2015-07-07 NOTE — Telephone Encounter (Signed)
Dr Jana Hakim added

## 2015-07-08 ENCOUNTER — Ambulatory Visit (HOSPITAL_BASED_OUTPATIENT_CLINIC_OR_DEPARTMENT_OTHER): Payer: 59

## 2015-07-08 VITALS — BP 105/67 | HR 109 | Temp 98.9°F

## 2015-07-08 DIAGNOSIS — C50312 Malignant neoplasm of lower-inner quadrant of left female breast: Secondary | ICD-10-CM

## 2015-07-08 DIAGNOSIS — C760 Malignant neoplasm of head, face and neck: Secondary | ICD-10-CM | POA: Diagnosis not present

## 2015-07-08 DIAGNOSIS — C50919 Malignant neoplasm of unspecified site of unspecified female breast: Secondary | ICD-10-CM

## 2015-07-08 DIAGNOSIS — Z5189 Encounter for other specified aftercare: Secondary | ICD-10-CM

## 2015-07-08 MED ORDER — TBO-FILGRASTIM 300 MCG/0.5ML ~~LOC~~ SOSY
300.0000 ug | PREFILLED_SYRINGE | Freq: Once | SUBCUTANEOUS | Status: AC
  Administered 2015-07-08: 300 ug via SUBCUTANEOUS
  Filled 2015-07-08: qty 0.5

## 2015-07-09 ENCOUNTER — Ambulatory Visit (HOSPITAL_BASED_OUTPATIENT_CLINIC_OR_DEPARTMENT_OTHER): Payer: 59

## 2015-07-09 VITALS — BP 116/70 | HR 127 | Temp 98.9°F

## 2015-07-09 DIAGNOSIS — C50312 Malignant neoplasm of lower-inner quadrant of left female breast: Secondary | ICD-10-CM

## 2015-07-09 DIAGNOSIS — Z5189 Encounter for other specified aftercare: Secondary | ICD-10-CM

## 2015-07-09 DIAGNOSIS — C760 Malignant neoplasm of head, face and neck: Secondary | ICD-10-CM

## 2015-07-09 DIAGNOSIS — C50919 Malignant neoplasm of unspecified site of unspecified female breast: Secondary | ICD-10-CM

## 2015-07-09 MED ORDER — TBO-FILGRASTIM 300 MCG/0.5ML ~~LOC~~ SOSY
300.0000 ug | PREFILLED_SYRINGE | Freq: Once | SUBCUTANEOUS | Status: AC
  Administered 2015-07-09: 300 ug via SUBCUTANEOUS
  Filled 2015-07-09: qty 0.5

## 2015-07-10 ENCOUNTER — Ambulatory Visit (HOSPITAL_BASED_OUTPATIENT_CLINIC_OR_DEPARTMENT_OTHER): Payer: 59

## 2015-07-10 VITALS — BP 118/74 | HR 113 | Temp 99.3°F

## 2015-07-10 DIAGNOSIS — C50919 Malignant neoplasm of unspecified site of unspecified female breast: Secondary | ICD-10-CM

## 2015-07-10 DIAGNOSIS — C50312 Malignant neoplasm of lower-inner quadrant of left female breast: Secondary | ICD-10-CM

## 2015-07-10 DIAGNOSIS — C760 Malignant neoplasm of head, face and neck: Secondary | ICD-10-CM

## 2015-07-10 DIAGNOSIS — Z5189 Encounter for other specified aftercare: Secondary | ICD-10-CM

## 2015-07-10 MED ORDER — TBO-FILGRASTIM 300 MCG/0.5ML ~~LOC~~ SOSY
300.0000 ug | PREFILLED_SYRINGE | Freq: Once | SUBCUTANEOUS | Status: AC
Start: 2015-07-10 — End: 2015-07-10
  Administered 2015-07-10: 300 ug via SUBCUTANEOUS
  Filled 2015-07-10: qty 0.5

## 2015-07-13 ENCOUNTER — Ambulatory Visit: Payer: 59 | Admitting: Oncology

## 2015-07-13 ENCOUNTER — Ambulatory Visit (HOSPITAL_COMMUNITY)
Admission: RE | Admit: 2015-07-13 | Discharge: 2015-07-13 | Disposition: A | Discharge status: Home / Self Care | Payer: 59 | Source: Ambulatory Visit | Attending: Oncology | Admitting: Oncology

## 2015-07-13 ENCOUNTER — Ambulatory Visit (HOSPITAL_BASED_OUTPATIENT_CLINIC_OR_DEPARTMENT_OTHER): Payer: 59

## 2015-07-13 ENCOUNTER — Other Ambulatory Visit: Payer: 59

## 2015-07-13 ENCOUNTER — Other Ambulatory Visit (HOSPITAL_BASED_OUTPATIENT_CLINIC_OR_DEPARTMENT_OTHER): Payer: 59

## 2015-07-13 ENCOUNTER — Ambulatory Visit (HOSPITAL_BASED_OUTPATIENT_CLINIC_OR_DEPARTMENT_OTHER): Payer: 59 | Admitting: Nurse Practitioner

## 2015-07-13 ENCOUNTER — Encounter: Payer: Self-pay | Admitting: Nurse Practitioner

## 2015-07-13 VITALS — BP 122/78 | HR 118 | Temp 99.1°F | Resp 18 | Ht 63.0 in | Wt 169.7 lb

## 2015-07-13 DIAGNOSIS — C760 Malignant neoplasm of head, face and neck: Secondary | ICD-10-CM

## 2015-07-13 DIAGNOSIS — C50312 Malignant neoplasm of lower-inner quadrant of left female breast: Secondary | ICD-10-CM

## 2015-07-13 DIAGNOSIS — Z5189 Encounter for other specified aftercare: Secondary | ICD-10-CM | POA: Diagnosis not present

## 2015-07-13 DIAGNOSIS — D630 Anemia in neoplastic disease: Secondary | ICD-10-CM | POA: Insufficient documentation

## 2015-07-13 DIAGNOSIS — J90 Pleural effusion, not elsewhere classified: Secondary | ICD-10-CM | POA: Diagnosis not present

## 2015-07-13 DIAGNOSIS — C799 Secondary malignant neoplasm of unspecified site: Secondary | ICD-10-CM

## 2015-07-13 DIAGNOSIS — C50919 Malignant neoplasm of unspecified site of unspecified female breast: Secondary | ICD-10-CM

## 2015-07-13 DIAGNOSIS — Z5111 Encounter for antineoplastic chemotherapy: Secondary | ICD-10-CM | POA: Diagnosis not present

## 2015-07-13 DIAGNOSIS — D61818 Other pancytopenia: Secondary | ICD-10-CM | POA: Insufficient documentation

## 2015-07-13 LAB — COMPREHENSIVE METABOLIC PANEL (CC13)
ALK PHOS: 190 U/L — AB (ref 40–150)
ALT: 34 U/L (ref 0–55)
ANION GAP: 6 meq/L (ref 3–11)
AST: 41 U/L — ABNORMAL HIGH (ref 5–34)
Albumin: 2.6 g/dL — ABNORMAL LOW (ref 3.5–5.0)
BUN: 8.1 mg/dL (ref 7.0–26.0)
CO2: 28 mEq/L (ref 22–29)
Calcium: 8.9 mg/dL (ref 8.4–10.4)
Chloride: 105 mEq/L (ref 98–109)
Creatinine: 0.6 mg/dL (ref 0.6–1.1)
EGFR: >90 mL/min/{1.73_m2} (ref >90)
Glucose: 114 mg/dl (ref 70–140)
POTASSIUM: 3.8 meq/L (ref 3.5–5.1)
Sodium: 139 mEq/L (ref 136–145)
Total Bilirubin: 0.66 mg/dL (ref 0.20–1.20)
Total Protein: 6 g/dL — ABNORMAL LOW (ref 6.4–8.3)

## 2015-07-13 LAB — CBC WITH DIFFERENTIAL/PLATELET
BASO%: 0.1 % (ref 0.0–2.0)
Basophils Absolute: 0 10*3/uL (ref 0.0–0.1)
EOS%: 0.2 % (ref 0.0–7.0)
Eosinophils Absolute: 0 10*3/uL (ref 0.0–0.5)
HCT: 32.5 % — ABNORMAL LOW (ref 34.8–46.6)
HGB: 10.5 g/dL — ABNORMAL LOW (ref 11.6–15.9)
LYMPH#: 1.1 10*3/uL (ref 0.9–3.3)
LYMPH%: 15.4 % (ref 14.0–49.7)
MCH: 28.3 pg (ref 25.1–34.0)
MCHC: 32.3 g/dL (ref 31.5–36.0)
MCV: 87.7 fL (ref 79.5–101.0)
MONO#: 0.8 10*3/uL (ref 0.1–0.9)
MONO%: 11 % (ref 0.0–14.0)
NEUT#: 5 10*3/uL (ref 1.5–6.5)
NEUT%: 73.3 % (ref 38.4–76.8)
PLATELETS: 231 10*3/uL (ref 145–400)
RBC: 3.71 10*6/uL (ref 3.70–5.45)
RDW: 20.2 % — ABNORMAL HIGH (ref 11.2–14.5)
WBC: 6.9 10*3/uL (ref 3.9–10.3)

## 2015-07-13 MED ORDER — SODIUM CHLORIDE 0.9 % IJ SOLN
10.0000 mL | INTRAMUSCULAR | Status: DC | PRN
  Administered 2015-07-13: 10 mL
  Filled 2015-07-13: qty 10

## 2015-07-13 MED ORDER — HEPARIN SOD (PORK) LOCK FLUSH 100 UNIT/ML IV SOLN
500.0000 [IU] | Freq: Once | INTRAVENOUS | Status: AC | PRN
  Administered 2015-07-13: 500 [IU]
  Filled 2015-07-13: qty 5

## 2015-07-13 MED ORDER — SODIUM CHLORIDE 0.9 % IV SOLN
Freq: Once | INTRAVENOUS | Status: AC
  Administered 2015-07-13: 15:00:00 via INTRAVENOUS
  Filled 2015-07-13: qty 4

## 2015-07-13 MED ORDER — PEGFILGRASTIM 6 MG/0.6ML ~~LOC~~ PSKT
6.0000 mg | PREFILLED_SYRINGE | Freq: Once | SUBCUTANEOUS | Status: AC
  Administered 2015-07-13: 6 mg via SUBCUTANEOUS
  Filled 2015-07-13: qty 0.6

## 2015-07-13 MED ORDER — SODIUM CHLORIDE 0.9 % IV SOLN
Freq: Once | INTRAVENOUS | Status: AC
  Administered 2015-07-13: 15:00:00 via INTRAVENOUS

## 2015-07-13 MED ORDER — SODIUM CHLORIDE 0.9 % IV SOLN
202.0500 mg | Freq: Once | INTRAVENOUS | Status: AC
  Administered 2015-07-13: 200 mg via INTRAVENOUS
  Filled 2015-07-13: qty 20

## 2015-07-13 MED ORDER — SODIUM CHLORIDE 0.9 % IV SOLN
800.0000 mg/m2 | Freq: Once | INTRAVENOUS | Status: AC
  Administered 2015-07-13: 1520 mg via INTRAVENOUS
  Filled 2015-07-13: qty 39.98

## 2015-07-13 NOTE — Progress Notes (Signed)
ID: Kristin Hoffman OB: May 10, 1966  MR#: [id number]  UYQ#:[id number]  PCP: Drake Leach, MD GYN:  [name] SU: [name] OTHER MD: [name], [name]  CHIEF COMPLAINT:  Stage IV breast cancer  CURRENT TREATMENT: carboplatin/ gemcitabine  BREAST CANCER HISTORY: This patient was previously followed by [name], and was transferred to [name] service as of 08/20/2013.  At the age of 49, the patient had a screening mammogram as a baseline. She had recently given birth and was on birth control pills at that time. The mammogram in November 2006 showed an area of architectural distortion in the left lower inner quadrant. Subsequently an ultrasound was obtained of the left breast showing a 2.0 x 1.5 x 1.4 cm mass, at the 8:00 position, 4 cm from the nipple. A core biopsy performed on 10/21/2005 showed an invasive mammary carcinoma, ER +70%, PR +41%, HER-2/neu negative, with proliferation marker of 38%. [phone])  Breast MRI on 11/08/2005 confirmed a 2.5 cm spiculated mass in the left lower inner quadrant with 3 small satellite nodules adjacent to the primary mass: 3.5 cm posterior medial to the primary mass, measuring 9 mm; 1.5 cm anterior medial to the primary mass measuring 5 mm; and 2 cm medial to the primary mass measuring 5 mm. No axillary adenopathy was noted. No suspicious masses or enhancement are noted in the right breast.  [place] underwentt left lumpectomy under the care of Dr. 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 2 of 23 lymph nodes were involved.  Margins were clear.  She received adjuvant chemotherapy consisting of 6 q. three-week doses of docetaxel/doxorubicin/cyclophosphamide given between January 2007 and may of 2007, with last dose on 04/20/2006. She underwent radiation therapy completed 07/11/2006, after which she began on tamoxifen in early August  2007.  She underwent hysterectomy with bilateral salpingo-oophorectomy on 07/15/2008, and was started on letrozole in October of 2009.  A mammogram 11/10/2009 showed microcalcifications in the left breast, and a subsequent biopsy on 11/18/2009 confirmed invasive mammary carcinoma in the left breast. PET and CT of the chest showed no evidence of metastasis in January 2011.  She underwent bilateral mastectomies 01/25/2010, with the right breast showing no evidence of malignancy; in the left breast showing a 1.0 cm grade 2 invasive ductal carcinoma with high-grade DCIS. Tumor was ER +22%, PR negative, HER-2/neu negative, with proliferation marker of 13%. Margins were clear. Patient underwent concurrent left latissimus flap reconstruction and right implant reconstruction.  She received additional chemotherapy with one cycle of carboplatin/gemcitabine given on 03/11/2010. She then received 5 q. three-week cycles of IV CMF between 03/25/2010 and 06/17/2010.  She was started on exemestane in January 2012 and continued until late November 2013 when she was hospitalized for apparent TIA.   Her subsequent history is as detailed below  INTERVAL HISTORY: Paisley returns today for follow up of her metastatic breast cancer, accompanied by her father. Today is day 8 cycle 3 of 3 planned cycles planned of carboplatin and gemcitabine given on days 1 and 8 of each cycle. She receives Neupogen on days 2,  3 and 4 of each cycle and then onpro on day 9. Carboplatin dose has been reduced to AUC 1.5 due to previous thrombocytopenia.   REVIEW OF SYSTEMS: [name] denies fevers, chills, nausea, or vomiting. She continues to use prune juice for regular stools. Her appetite is good. She use tramadol for her  pain every 6 hours and is generally pain free for most of them. She did have increased muscle pain after her last neupogen injection, but this resolved on its own. She denies mouth sores or rashes. She has shortness of breath with  exertion, but denies chest pain, or cough. She notices her heart rate increases when she is without her oxygen for long periods of time when she takes a break. She sleeps well, but does have some fatigue during the day. A detailed review of systems is otherwise stable.  PAST MEDICaL HISTORY: Past Medical History  Diagnosis Date  . Depression   . Reflux   . Hx-TIA (transient ischemic attack) 11/22/2013  . Chronic headaches 11/22/2013  . Anxiety 11/22/2013  . Breast cancer   . Cancer   . Breast cancer metastasized to multiple sites 12/24/2013    PAST SURGICAL HISTORY: Past Surgical History  Procedure Laterality Date  . Mastectomy w/ nodes partial    . Mastectomy    . Breast reconstruction    . Portacath placement      x 2  . Portacath removal      x 2  . Abdominal hysterectomy    . Tee without cardioversion  11/12/2012    Procedure: TRANSESOPHAGEAL ECHOCARDIOGRAM (TEE);  Surgeon: Candee Furbish, MD;  Location: [facility] ENDOSCOPY;  Service: Cardiovascular;  Laterality: N/A;  This TEE may be Dr. Marlou Porch or a LHC Dr    FAMILY HISTORY Both parents are alive and well. Patient has one sister who is 21 years younger and is in good health. No other history of breast or ovarian cancer in the family. Family History  Problem Relation Age of Onset  . Diabetes Mellitus II Neg Hx   . Hypertension Neg Hx     GYNECOLOGIC HISTORY:   (Updated January 2015) G2P2, menarche at age 62 with irregular menses. On birth control pills in the past. On Clomid to induce ovulation with first pregnancy. Also had preeclampsia with first pregnancy. Status post hysterectomy and bilateral salpingo-oophorectomy in August 2009.  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 She is originally from [place]. She's been married to [place], for 13 years. He works as an Pharmacist, hospital at [employer].   ADVANCED DIRECTIVES:  Not in place;  Not in place. At the clinic visit 07/13/2015 the patient was given the appropriate forms to complete and notarize at her discretion.    HEALTH MAINTENANCE:  (Updated 11/22/2013) History  Substance Use Topics  . Smoking status: Never Smoker   . Smokeless tobacco: Never Used  . Alcohol Use: Yes     Comment: rarely     Colonoscopy:  Never  PAP: S/P LAVH/BSO in August 2009  Bone density: Never  Lipid panel: Not on file   Allergies  Allergen Reactions  . Afinitor [Everolimus] Hives and Itching    Current Outpatient Prescriptions  Medication Sig Dispense Refill  . acidophilus (RISAQUAD) CAPS capsule Take 1 capsule by mouth every morning.     Marland Kitchen aspirin EC 81 MG tablet Take 81 mg by mouth daily with breakfast.    . Biotin 5000 MCG CAPS Take 5,000 mcg by mouth every morning.  30 capsule   . cetirizine (ZYRTEC) 10 MG tablet Take 10 mg by mouth at bedtime.     . Cholecalciferol 2000 UNITS CHEW Chew 1 tablet by mouth daily with breakfast.     . escitalopram (  LEXAPRO) 20 MG tablet Take 1 tablet (20 mg total) by mouth daily. 30 tablet 12  . Ibuprofen (ADVIL) 200 MG CAPS Take 2 capsules (400 mg total) by mouth 3 (three) times daily with meals as needed. (Patient taking differently: Take 400 mg by mouth every 6 (six) hours as needed (for pain). ) 120 each 0  . LORazepam (ATIVAN) 0.5 MG tablet TAKE 1 TABLET BY MOUTH AS NEEDED NAUSEA AND VOMITING 30 tablet 0  . Melatonin 10 MG TABS Take 1 tablet by mouth at bedtime.    . montelukast (SINGULAIR) 10 MG tablet Take 10 mg by mouth daily with breakfast.     . Multiple Vitamin (MULTIVITAMIN) tablet Take 1 tablet by mouth every morning.     Marland Kitchen omeprazole (PRILOSEC) 40 MG capsule Take 40 mg by mouth every morning.     . ondansetron (ZOFRAN) 8 MG tablet Take 8 mg by mouth 2 (two) times daily. Starts day after chemo    . traMADol (ULTRAM) 50 MG tablet TAKE 1 TABLET BY MOUTH EVERY 6 HOURS AS NEEDED 30 tablet 1  . buPROPion (WELLBUTRIN XL) 150 MG 24 hr tablet  Take 1 tablet (150 mg total) by mouth daily. (Patient not taking: Reported on 06/23/2015) 30 tablet 12  . docusate sodium (COLACE) 100 MG capsule Take 1 capsule (100 mg total) by mouth 2 (two) times daily. (Patient not taking: Reported on 07/13/2015) 20 capsule 0  . filgrastim (NEUPOGEN) 300 MCG/0.5ML SOSY injection Inject 0.5 mLs (300 mcg total) into the skin once. (Patient not taking: Reported on 07/13/2015) 6 Syringe 1  . nystatin (MYCOSTATIN) 100000 UNIT/ML suspension Take 5 mLs (500,000 Units total) by mouth 4 (four) times daily. (Patient not taking: Reported on 07/13/2015) 240 mL 1  . prochlorperazine (COMPAZINE) 10 MG tablet Take 10 mg by mouth every 6 (six) hours as needed for nausea or vomiting.      No current facility-administered medications for this visit.    OBJECTIVE: Young white woman in no acute distress  Filed Vitals:   07/13/15 1343  BP: 122/78  Pulse: 118  Temp: 99.1 F (37.3 C)  Resp: 18  Body mass index is 30.07 kg/(m^2).  ECOG: 2 Filed Weights   07/13/15 1343  Weight: 169 lb 11.2 oz (76.975 kg)   Skin: warm, dry  HEENT: sclerae anicteric, conjunctivae pink, oropharynx clear. No thrush or mucositis.  Lymph Nodes: No cervical or supraclavicular lymphadenopathy  Lungs: clear to auscultation bilaterally, no rales, wheezes, or rhonci  Heart: regular rhythm. Accelerated rate.  Abdomen: round, soft, non tender, positive bowel sounds  Musculoskeletal: No focal spinal tenderness, no peripheral edema  Neuro: non focal, well oriented, positive affect  Breasts: deferred   LAB RESULTS:   Lab Results  Component Value Date   WBC 6.9 07/13/2015   NEUTROABS 5.0 07/13/2015   HGB 10.5* 07/13/2015   HCT 32.5* 07/13/2015   MCV 87.7 07/13/2015   PLT 231 07/13/2015      Chemistry      Component Value Date/Time   NA 139 07/13/2015 1325   NA 138 05/30/2015 0525   K 3.8 07/13/2015 1325   K 3.7 05/30/2015 0525   CL 104 05/30/2015 0525   CL 104 11/14/2012 1407   CO2 28  07/13/2015 1325   CO2 27 05/30/2015 0525   BUN 8.1 07/13/2015 1325   BUN 16 05/30/2015 0525   CREATININE 0.6 07/13/2015 1325   CREATININE 0.68 05/30/2015 0525      Component Value Date/Time  CALCIUM 8.9 07/13/2015 1325   CALCIUM 8.2* 05/30/2015 0525   ALKPHOS 190* 07/13/2015 1325   ALKPHOS 88 05/30/2015 0525   AST 41* 07/13/2015 1325   AST 193* 05/30/2015 0525   ALT 34 07/13/2015 1325   ALT 76* 05/30/2015 0525   BILITOT 0.66 07/13/2015 1325   BILITOT 1.0 05/30/2015 0525      STUDIES: No results found.   ASSESSMENT: 49 y.o. [place] woman  (1)  status post left lumpectomy under the care of Dr. 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 2 of 23 lymph nodes were involved.  Margins were clear.  (2) status post adjuvant chemotherapy consisting of 6 q. three-week doses of docetaxel/ doxorubicin/ cyclophosphamide given between January 2007 and may of 2007, with last dose on 04/20/2006.  (3) status post radiation therapy to the left breast, completed 07/11/2006  (4) began on tamoxifen in early August 2007 and continued until October 2009. She status post hysterectomy with bilateral salpingo-oophorectomy on 07/15/2008, and was started on letrozole in October of 2009.  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 PET and CT of the chest showed no evidence of metastasis in January 2011.  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 Margins were clear. Patient underwent concurrent left latissimus flap reconstruction and right implant reconstruction.  (7)  status  post additional chemotherapy with one cycle of carboplatin/gemcitabine given on 03/11/2010. She then received 5 q. three-week cycles of IV CMF between 03/25/2010 and 06/17/2010.  (8) started on exemestane in January 2012 and continued until late November 2013 when she was hospitalized for apparent TIA at which time her exemestane was stopped and not resumed  (9) METASTATIC DISEASE: evidence of disease recurence noted on chest CT, liver MRI and PET scan in December 2014, with suspicious lung nodules, lymphadenopathy, and 3 lesions in the right lobe of the liver, but no evidence of bony disease and no evidence of brain involvement by brain MRI September 2014. Biopsy of a left supraclavicular lymph node on 12/11/2013 confirmed metastatic carcinoma, estrogen receptor 45% positive, progesterone receptor 17% positive, with no HER-2 amplification.  (10) fulvestrant started 12/03/2013, discontinued 03/25/2014 with progression  (11) exemestane/ everolimus started 04/04/2014; everolimus held 04/14/2014, with skin rash and mouth soreness; resumed at 5 mg a day as of 04/29/2014, discontinued 05/09/2014 with similar symptoms  (12) continueing exemestane, adding palbociclib 05/26/2014;   (a) palbociclib dose dropped to 75 mg every other day for 21 days beginning with cycle 4  (b) exemestane and Palbociclib discontinued December 2015 with evidence of progression  (13) [facility] referral placed 04/28/2049 -- re-sent 05/23/2014-- shows Pi3KCA and TP53 mutations  (a) [facility] one test requested 12/10/2014-- confirms [employer] results  (b) did not qualify for capecitabine/ BYL 719 trial because of elevated lipase  (14) started eribulin 12/26/2014, 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   (a) dpse dropped from 2g BID to 1.5g BID starting on  04/26/15  (b) stopped 05/17/2015 (after 4 cycles) with progression  (  16) started carboplatin/ gemcitabine 05/26/2015, to be given day one and day 8 of each 21 day cycle  (a) given her history of severe neutropenia with this chemotherapy we'll do Neupogen days 2, 3 and 4 of each cycle and onpro day 9  (b) carbo dose dropped by 25% w cycle 2 due to very low platelet nadir  (17) Left pleural effusion\   (a) s/p L thoracentesis 05/22/2015, 05/29/2015 and 06/05/2015  (b) attempt at pleurx 06/12/2015 cancelled as effusion loculated and scant   PLAN:  Dvora looks and feels well today. The labs were reviewed in detail and were stable. She will proceed with day 8, cycle 3 of carboplatin and gemcitabine as planned today. She is preemptively scheduled for a lab drawn this Friday, to assess whether blood products will be necessary on Monday.   Brenn will return in 2 weeks for labs and a follow up visit with [name]. Prior to this visit, she will have repeat scans. She is anxious for these results, but is optimistic. She understands the goal of treatment in her case is control. She has been encouraged to call with any issues that might arise before her next visit here.     Laurie Panda, NP   07/13/2015 2:20 PM

## 2015-07-13 NOTE — Patient Instructions (Signed)
[facility] Discharge Instructions for Patients Receiving Chemotherapy  Today you received the following chemotherapy agents Gemzar, Carboplatin  To help prevent nausea and vomiting after your treatment, we encourage you to take your nausea medication     If you develop nausea and vomiting that is not controlled by your nausea medication, call the clinic.   BELOW ARE SYMPTOMS THAT SHOULD BE REPORTED IMMEDIATELY:  *FEVER GREATER THAN 100.5 F  *CHILLS WITH OR WITHOUT FEVER  NAUSEA AND VOMITING THAT IS NOT CONTROLLED WITH YOUR NAUSEA MEDICATION  *UNUSUAL SHORTNESS OF BREATH  *UNUSUAL BRUISING OR BLEEDING  TENDERNESS IN MOUTH AND THROAT WITH OR WITHOUT PRESENCE OF ULCERS  *URINARY PROBLEMS  *BOWEL PROBLEMS  UNUSUAL RASH Items with * indicate a potential emergency and should be followed up as soon as possible.  Feel free to call the clinic you have any questions or concerns. The clinic phone number is [phone].  Please show the CHEMO ALERT CARD at check-in to the Emergency Department and triage nurse.   

## 2015-07-14 ENCOUNTER — Ambulatory Visit: Payer: 59

## 2015-07-14 ENCOUNTER — Other Ambulatory Visit: Payer: 59

## 2015-07-15 ENCOUNTER — Ambulatory Visit: Payer: 59

## 2015-07-16 ENCOUNTER — Ambulatory Visit: Payer: 59

## 2015-07-17 ENCOUNTER — Other Ambulatory Visit: Payer: Self-pay | Admitting: *Deleted

## 2015-07-17 ENCOUNTER — Telehealth: Payer: Self-pay | Admitting: *Deleted

## 2015-07-17 ENCOUNTER — Other Ambulatory Visit (HOSPITAL_BASED_OUTPATIENT_CLINIC_OR_DEPARTMENT_OTHER): Payer: 59

## 2015-07-17 ENCOUNTER — Ambulatory Visit: Payer: 59

## 2015-07-17 DIAGNOSIS — C50312 Malignant neoplasm of lower-inner quadrant of left female breast: Secondary | ICD-10-CM

## 2015-07-17 DIAGNOSIS — J90 Pleural effusion, not elsewhere classified: Secondary | ICD-10-CM

## 2015-07-17 DIAGNOSIS — D61818 Other pancytopenia: Secondary | ICD-10-CM | POA: Diagnosis not present

## 2015-07-17 DIAGNOSIS — Z8673 Personal history of transient ischemic attack (TIA), and cerebral infarction without residual deficits: Secondary | ICD-10-CM

## 2015-07-17 DIAGNOSIS — D630 Anemia in neoplastic disease: Secondary | ICD-10-CM

## 2015-07-17 DIAGNOSIS — C50919 Malignant neoplasm of unspecified site of unspecified female breast: Secondary | ICD-10-CM

## 2015-07-17 LAB — COMPREHENSIVE METABOLIC PANEL (CC13)
ALBUMIN: 2.4 g/dL — AB (ref 3.5–5.0)
ALT: 65 U/L — AB (ref 0–55)
ANION GAP: 7 meq/L (ref 3–11)
AST: 52 U/L — ABNORMAL HIGH (ref 5–34)
Alkaline Phosphatase: 213 U/L — ABNORMAL HIGH (ref 40–150)
BUN: 16.3 mg/dL (ref 7.0–26.0)
CALCIUM: 8.7 mg/dL (ref 8.4–10.4)
CHLORIDE: 106 meq/L (ref 98–109)
CO2: 29 mEq/L (ref 22–29)
Creatinine: 0.6 mg/dL (ref 0.6–1.1)
GLUCOSE: 107 mg/dL (ref 70–140)
Potassium: 3.9 mEq/L (ref 3.5–5.1)
SODIUM: 142 meq/L (ref 136–145)
Total Bilirubin: 1.1 mg/dL (ref 0.20–1.20)
Total Protein: 5.5 g/dL — ABNORMAL LOW (ref 6.4–8.3)

## 2015-07-17 LAB — CBC WITH DIFFERENTIAL/PLATELET
BASO%: 0.2 % (ref 0.0–2.0)
Basophils Absolute: 0 10*3/uL (ref 0.0–0.1)
EOS%: 0.3 % (ref 0.0–7.0)
Eosinophils Absolute: 0.1 10*3/uL (ref 0.0–0.5)
HCT: 29.6 % — ABNORMAL LOW (ref 34.8–46.6)
HEMOGLOBIN: 9.3 g/dL — AB (ref 11.6–15.9)
LYMPH%: 3.6 % — ABNORMAL LOW (ref 14.0–49.7)
MCH: 28.2 pg (ref 25.1–34.0)
MCHC: 31.5 g/dL (ref 31.5–36.0)
MCV: 89.5 fL (ref 79.5–101.0)
MONO#: 0.1 10*3/uL (ref 0.1–0.9)
MONO%: 0.3 % (ref 0.0–14.0)
NEUT#: 19.2 10*3/uL — ABNORMAL HIGH (ref 1.5–6.5)
NEUT%: 95.6 % — ABNORMAL HIGH (ref 38.4–76.8)
Platelets: 40 10*3/uL — ABNORMAL LOW (ref 145–400)
RBC: 3.31 10*6/uL — AB (ref 3.70–5.45)
RDW: 20.8 % — ABNORMAL HIGH (ref 11.2–14.5)
WBC: 20.1 10*3/uL — ABNORMAL HIGH (ref 3.9–10.3)
lymph#: 0.7 10*3/uL — ABNORMAL LOW (ref 0.9–3.3)

## 2015-07-17 LAB — HOLD TUBE, BLOOD BANK

## 2015-07-17 NOTE — Telephone Encounter (Signed)
This RN spoke with pt per lab results showing no need for transfusion of blood or platelets at this time.  Per discussion with pt and her concern " we checked it earlier then usual and I am concerned my platelets may bottom out before my next lab check "  Plan is pt will come in Monday prior to scheduled transfusion for lab check - decision will be made at that time if pt needs to be transfused.  Urgent POF sent to scheduling per above lab need.

## 2015-07-17 NOTE — Telephone Encounter (Signed)
Voicemail transferred to collaborative.  "I was at the [facility] earlier.  I know you said i do not need blood or platelets.  I've never had labs this early.  I'm worried I may bottom out next week.  Could you call me to discuss the need for an appointment next week."

## 2015-07-20 ENCOUNTER — Other Ambulatory Visit: Payer: Self-pay | Admitting: Nurse Practitioner

## 2015-07-20 ENCOUNTER — Other Ambulatory Visit: Payer: Self-pay | Admitting: *Deleted

## 2015-07-20 ENCOUNTER — Ambulatory Visit (HOSPITAL_BASED_OUTPATIENT_CLINIC_OR_DEPARTMENT_OTHER): Payer: 59

## 2015-07-20 ENCOUNTER — Other Ambulatory Visit (HOSPITAL_BASED_OUTPATIENT_CLINIC_OR_DEPARTMENT_OTHER): Payer: 59

## 2015-07-20 VITALS — BP 119/71 | HR 87 | Temp 98.0°F | Resp 18

## 2015-07-20 DIAGNOSIS — C50312 Malignant neoplasm of lower-inner quadrant of left female breast: Secondary | ICD-10-CM

## 2015-07-20 DIAGNOSIS — C50919 Malignant neoplasm of unspecified site of unspecified female breast: Secondary | ICD-10-CM

## 2015-07-20 DIAGNOSIS — D630 Anemia in neoplastic disease: Secondary | ICD-10-CM

## 2015-07-20 DIAGNOSIS — D61818 Other pancytopenia: Secondary | ICD-10-CM | POA: Diagnosis not present

## 2015-07-20 LAB — COMPREHENSIVE METABOLIC PANEL (CC13)
ALBUMIN: 2.3 g/dL — AB (ref 3.5–5.0)
ALT: 39 U/L (ref 0–55)
ANION GAP: 7 meq/L (ref 3–11)
AST: 34 U/L (ref 5–34)
Alkaline Phosphatase: 180 U/L — ABNORMAL HIGH (ref 40–150)
BUN: 13.9 mg/dL (ref 7.0–26.0)
CO2: 28 mEq/L (ref 22–29)
CREATININE: 0.6 mg/dL (ref 0.6–1.1)
Calcium: 8.4 mg/dL (ref 8.4–10.4)
Chloride: 110 mEq/L — ABNORMAL HIGH (ref 98–109)
Glucose: 112 mg/dl (ref 70–140)
POTASSIUM: 3.8 meq/L (ref 3.5–5.1)
SODIUM: 145 meq/L (ref 136–145)
Total Bilirubin: 0.75 mg/dL (ref 0.20–1.20)
Total Protein: 5.1 g/dL — ABNORMAL LOW (ref 6.4–8.3)

## 2015-07-20 LAB — CBC WITH DIFFERENTIAL/PLATELET
BASO%: 0.2 % (ref 0.0–2.0)
Basophils Absolute: 0 10*3/uL (ref 0.0–0.1)
EOS%: 0.1 % (ref 0.0–7.0)
Eosinophils Absolute: 0 10*3/uL (ref 0.0–0.5)
HCT: 23.2 % — ABNORMAL LOW (ref 34.8–46.6)
HGB: 7.2 g/dL — ABNORMAL LOW (ref 11.6–15.9)
LYMPH#: 0.8 10*3/uL — AB (ref 0.9–3.3)
LYMPH%: 8.7 % — ABNORMAL LOW (ref 14.0–49.7)
MCH: 28.1 pg (ref 25.1–34.0)
MCHC: 31 g/dL — ABNORMAL LOW (ref 31.5–36.0)
MCV: 90.6 fL (ref 79.5–101.0)
MONO#: 0.3 10*3/uL (ref 0.1–0.9)
MONO%: 2.6 % (ref 0.0–14.0)
NEUT#: 8.5 10*3/uL — ABNORMAL HIGH (ref 1.5–6.5)
NEUT%: 88.4 % — AB (ref 38.4–76.8)
Platelets: 4 10*3/uL — CL (ref 145–400)
RBC: 2.56 10*6/uL — ABNORMAL LOW (ref 3.70–5.45)
RDW: 19.2 % — ABNORMAL HIGH (ref 11.2–14.5)
WBC: 9.6 10*3/uL (ref 3.9–10.3)
nRBC: 0 % (ref 0–0)

## 2015-07-20 LAB — HOLD TUBE, BLOOD BANK

## 2015-07-20 LAB — PREPARE RBC (CROSSMATCH)

## 2015-07-20 LAB — TYPE AND SCREEN
ABO/RH(D): A POS
Antibody Screen: NEGATIVE

## 2015-07-20 MED ORDER — ACETAMINOPHEN 325 MG PO TABS
650.0000 mg | ORAL_TABLET | Freq: Once | ORAL | Status: AC
  Administered 2015-07-20: 650 mg via ORAL

## 2015-07-20 MED ORDER — SODIUM CHLORIDE 0.9 % IV SOLN
250.0000 mL | Freq: Once | INTRAVENOUS | Status: AC
  Administered 2015-07-20: 250 mL via INTRAVENOUS

## 2015-07-20 MED ORDER — HEPARIN SOD (PORK) LOCK FLUSH 100 UNIT/ML IV SOLN
500.0000 [IU] | Freq: Every day | INTRAVENOUS | Status: AC | PRN
  Administered 2015-07-20: 500 [IU]
  Filled 2015-07-20: qty 5

## 2015-07-20 MED ORDER — DIPHENHYDRAMINE HCL 25 MG PO CAPS
25.0000 mg | ORAL_CAPSULE | Freq: Once | ORAL | Status: AC
  Administered 2015-07-20: 25 mg via ORAL

## 2015-07-20 MED ORDER — ACETAMINOPHEN 325 MG PO TABS
ORAL_TABLET | ORAL | Status: AC
  Filled 2015-07-20: qty 2

## 2015-07-20 MED ORDER — SODIUM CHLORIDE 0.9 % IJ SOLN
10.0000 mL | INTRAMUSCULAR | Status: AC | PRN
  Administered 2015-07-20: 10 mL
  Filled 2015-07-20: qty 10

## 2015-07-20 MED ORDER — DIPHENHYDRAMINE HCL 25 MG PO CAPS
ORAL_CAPSULE | ORAL | Status: AC
  Filled 2015-07-20: qty 1

## 2015-07-20 NOTE — Progress Notes (Signed)
Labs reviewed with Gentry Fitz NP. Pt to receive 2 units PRBCs and one units of platelets. Aspirin is to be held until visit with [name] next week per Nira Conn NP, pt aware and verbalizes understanding. If nose bleeding worsens or continues with no relief after  Pt is to call clinic or go straight to the ER Per Val, [name] nurse. Pt aware to call clinic or go to ER if symptoms continue or worsens, pt verbalizes understanding.

## 2015-07-20 NOTE — Patient Instructions (Signed)
Blood Transfusion Information WHAT IS A BLOOD TRANSFUSION? A transfusion is the replacement of blood or some of its parts. Blood is made up of multiple cells which provide different functions.  Red blood cells carry oxygen and are used for blood loss replacement.  White blood cells fight against infection.  Platelets control bleeding.  Plasma helps clot blood.  Other blood products are available for specialized needs, such as hemophilia or other clotting disorders. BEFORE THE TRANSFUSION  Who gives blood for transfusions?   You may be able to donate blood to be used at a later date on yourself (autologous donation).  Relatives can be asked to donate blood. This is generally not any safer than if you have received blood from a stranger. The same precautions are taken to ensure safety when a relative's blood is donated.  Healthy volunteers who are fully evaluated to make sure their blood is safe. This is blood bank blood. Transfusion therapy is the safest it has ever been in the practice of medicine. Before blood is taken from a donor, a complete history is taken to make sure that person has no history of diseases nor engages in risky social behavior (examples are intravenous drug use or sexual activity with multiple partners). The donor's travel history is screened to minimize risk of transmitting infections, such as malaria. The donated blood is tested for signs of infectious diseases, such as HIV and hepatitis. The blood is then tested to be sure it is compatible with you in order to minimize the chance of a transfusion reaction. If you or a relative donates blood, this is often done in anticipation of surgery and is not appropriate for emergency situations. It takes many days to process the donated blood. RISKS AND COMPLICATIONS Although transfusion therapy is very safe and saves many lives, the main dangers of transfusion include:   Getting an infectious disease.  Developing a  transfusion reaction. This is an allergic reaction to something in the blood you were given. Every precaution is taken to prevent this. The decision to have a blood transfusion has been considered carefully by your caregiver before blood is given. Blood is not given unless the benefits outweigh the risks. AFTER THE TRANSFUSION  Right after receiving a blood transfusion, you will usually feel much better and more energetic. This is especially true if your red blood cells have gotten low (anemic). The transfusion raises the level of the red blood cells which carry oxygen, and this usually causes an energy increase.  The nurse administering the transfusion will monitor you carefully for complications. HOME CARE INSTRUCTIONS  No special instructions are needed after a transfusion. You may find your energy is better. Speak with your caregiver about any limitations on activity for underlying diseases you may have. SEEK MEDICAL CARE IF:   Your condition is not improving after your transfusion.  You develop redness or irritation at the intravenous (IV) site. SEEK IMMEDIATE MEDICAL CARE IF:  Any of the following symptoms occur over the next 12 hours:  Shaking chills.  You have a temperature by mouth above 102 F (38.9 C), not controlled by medicine.  Chest, back, or muscle pain.  People around you feel you are not acting correctly or are confused.  Shortness of breath or difficulty breathing.  Dizziness and fainting.  You get a rash or develop hives.  You have a decrease in urine output.  Your urine turns a dark color or changes to pink, red, or brown. Any of the following   symptoms occur over the next 10 days:  You have a temperature by mouth above 102 F (38.9 C), not controlled by medicine.  Shortness of breath.  Weakness after normal activity.  The white part of the eye turns yellow (jaundice).  You have a decrease in the amount of urine or are urinating less often.  Your  urine turns a dark color or changes to pink, red, or brown. Document Released: 11/25/2000 Document Revised: 02/20/2012 Document Reviewed: 07/14/2008 [facility] Patient Information 2015 [place], [place]. This information is not intended to replace advice given to you by your health care provider. Make sure you discuss any questions you have with your health care provider.3   Platelet Transfusion Information This is information about transfusions of platelets. Platelets are tiny cells made by the bone marrow and found in the blood. When a blood vessel is damaged, platelets rush to the damaged area to help form a clot. This begins the healing process. When platelets get very low, your blood may have trouble clotting. This may be from:  Illness.  Blood disorder.  Chemotherapy to treat cancer. Often, lower platelet counts do not cause problems.  Platelets usually last for 7 to 10 days. If they are not used in an injury, they are broken down by the liver or spleen. Symptoms of low platelet count include:  Nosebleeds.  Bleeding gums.  Heavy periods.  Bruising and tiny blood spots in the skin.  Pinpoint spots of bleeding (petechiae).  Larger bruises (purpura).  Bleeding can be more serious if it happens in the brain or bowel. Platelet transfusions are often used to keep the platelet count at an acceptable level. Serious bleeding due to low platelets is uncommon. RISKS AND COMPLICATIONS Severe side effects from platelet transfusions are uncommon. Minor reactions may include:  Itching.  Rashes.  High temperature and shivering. Medications are available to stop transfusion reactions. Let your health care provider know if you develop any of the above problems.  If you are having platelet transfusions frequently, they may get less effective. This is called becoming refractory to platelets. It is uncommon. This can happen from non-immune causes and immune causes. Non-immune causes  include:  High temperatures.  Some medications.  An enlarged spleen. Immune causes happen when your body discovers the platelets are not your own and begins making antibodies against them. The antibodies kill the platelets quickly. Even with platelet transfusions, you may still notice problems with bleeding or bruising. Let your health care providers know about this. Other things can be done to help if this happens.  BEFORE THE PROCEDURE   Your health care provider will check your platelet count regularly.  If the platelet count is too low, it may be necessary to have a platelet transfusion.  This is more important before certain procedures with a risk of bleeding, such as a spinal tap.  Platelet transfusion reduces the risk of bleeding during or after the procedure.  Except in emergencies, giving a transfusion requires a written consent. Before blood is taken from a donor, a complete history is taken to make sure the person has no history of previous diseases, nor engages in risky social behavior. Examples of this are intravenous drug use or sexual activity with multiple partners. This could lead to infected blood or blood products being used. This history is taken in spite of the extensive testing to make sure the blood is safe. All blood products transfused are tested to make sure it is a match for the person  getting the blood. It is also checked for infections. Blood is the safest it has ever been. The risk of getting an infection is very low. PROCEDURE  The platelets are stored in small plastic bags that are kept at a low temperature.  Each bag is called a unit and sometimes two units are given. They are given through an intravenous line by drip infusion over about one-half hour.  Usually blood is collected from multiple people to get enough to transfuse.  Sometimes, the platelets are collected from a single person. This is done using a special machine that separates the platelets  from the blood. The machine is called an apheresis machine. Platelets collected in this way are called apheresed platelets. Apheresed platelets reduce the risk of becoming sensitive to the platelets. This lowers the chances of having a transfusion reaction.  As it only takes a short time to give the platelets, this treatment can be given in an outpatient department. Platelets can also be given before or after other treatments. SEEK IMMEDIATE MEDICAL CARE IF: You have any of the following symptoms over the next 12 hours or several days:  Shaking chills.  Fever with a temperature greater than 102F (38.9C) develops.  Back pain or muscle pain.  People around you feel you are not acting correctly, or you are confused.  Blood in the urine or bowel movements, or bleeding from any place in your body.  Shortness of breath, or difficulty breathing.  Dizziness.  Fainting.  You break out in a rash or develop hives.  Decrease in the amount of urine you are putting out, or the urine turns a dark color or changes to pink, red, or brown.  A severe headache or stiff neck.  Bruising more easily. Document Released: 09/25/2007 Document Revised: 04/14/2014 Document Reviewed: 09/25/2007 [facility] Patient Information 2015 [place], [place]. This information is not intended to replace advice given to you by your health care provider. Make sure you discuss any questions you have with your health care provider.     Thrombocytopenia Thrombocytopenia is a condition in which there is an abnormally small number of platelets in your blood. Platelets are also called thrombocytes. Platelets are needed for blood clotting. CAUSES Thrombocytopenia is caused by:   Decreased production of platelets. This can be caused by:  Aplastic anemia in which your bone marrow quits making blood cells.  Cancer in the bone marrow.  Use of certain medicines, including chemotherapy.  Infection in the bone marrow.  Heavy  alcohol consumption.  Increased destruction of platelets. This can be caused by:  Certain immune diseases.  Use of certain drugs.  Certain blood clotting disorders.  Certain inherited disorders.  Certain bleeding disorders.  Pregnancy.  Having an enlarged spleen (hypersplenism). In hypersplenism, the spleen gathers up platelets from circulation. This means the platelets are not available to help with blood clotting. The spleen can enlarge due to cirrhosis or other conditions. SYMPTOMS  The symptoms of thrombocytopenia are side effects of poor blood clotting. Some of these are:  Abnormal bleeding.  Nosebleeds.  Heavy menstrual periods.  Blood in the urine or stools.  Purpura. This is a purplish discoloration in the skin produced by small bleeding vessels near the surface of the skin.  Bruising.  A rash that may be petechial. This looks like pinpoint, purplish-red spots on the skin and mucous membranes. It is caused by bleeding from small blood vessels (capillaries). DIAGNOSIS  Your caregiver will make this diagnosis based on your exam and blood  tests. Sometimes, a bone marrow study is done to look for the original cells (megakaryocytes) that make platelets. TREATMENT  Treatment depends on the cause of the condition.  Medicines may be given to help protect your platelets from being destroyed.  In some cases, a replacement (transfusion) of platelets may be required to stop or prevent bleeding.  Sometimes, the spleen must be surgically removed. HOME CARE INSTRUCTIONS   Check the skin and linings inside your mouth for bruising or bleeding as directed by your caregiver.  Check your sputum, urine, and stool for blood as directed by your caregiver.  Do not return to any activities that could cause bumps or bruises until your caregiver says it is okay.  Take extra care not to cut yourself when shaving or when using scissors, needles, knives, and other tools.  Take extra  care not to burn yourself when ironing or cooking.  Ask your caregiver if it is okay for you to drink alcohol.  Only take over-the-counter or prescription medicines as directed by your caregiver.  Notify all your caregivers, including dentists and eye doctors, about your condition. SEEK IMMEDIATE MEDICAL CARE IF:   You develop active bleeding from anywhere in your body.  You develop unexplained bruising or bleeding.  You have blood in your sputum, urine, or stool. MAKE SURE YOU:  Understand these instructions.  Will watch your condition.  Will get help right away if you are not doing well or get worse. Document Released: 11/28/2005 Document Revised: 02/20/2012 Document Reviewed: 09/30/2011 [facility] Patient Information 2015 [place], [place]. This information is not intended to replace advice given to you by your health care provider. Make sure you discuss any questions you have with your health care provider.

## 2015-07-21 LAB — TYPE AND SCREEN
ABO/RH(D): A POS
Antibody Screen: NEGATIVE
Unit division: 0
Unit division: 0

## 2015-07-21 LAB — PREPARE PLATELET PHERESIS: Unit division: 0

## 2015-07-24 ENCOUNTER — Other Ambulatory Visit: Payer: Self-pay | Admitting: Oncology

## 2015-07-24 ENCOUNTER — Other Ambulatory Visit: Payer: Self-pay | Admitting: *Deleted

## 2015-07-24 ENCOUNTER — Telehealth: Payer: Self-pay | Admitting: *Deleted

## 2015-07-24 MED ORDER — TRAMADOL HCL 50 MG PO TABS: 50.0000 mg | ORAL_TABLET | Freq: Four times a day (QID) | ORAL | Status: DC | PRN

## 2015-07-24 NOTE — Telephone Encounter (Signed)
TC from patient requesting refill on her Tramadol. Last filled for 30 tabs on 07/07/15. Please call pt when prescription is ready. She would like to pick it up today.  # is [phone]

## 2015-07-27 ENCOUNTER — Ambulatory Visit (HOSPITAL_COMMUNITY)
Admission: RE | Admit: 2015-07-27 | Discharge: 2015-07-27 | Disposition: A | Discharge status: Home / Self Care | Payer: 59 | Source: Ambulatory Visit | Attending: Oncology | Admitting: Oncology

## 2015-07-27 ENCOUNTER — Encounter (HOSPITAL_COMMUNITY): Payer: Self-pay

## 2015-07-27 DIAGNOSIS — C50919 Malignant neoplasm of unspecified site of unspecified female breast: Secondary | ICD-10-CM | POA: Diagnosis not present

## 2015-07-27 DIAGNOSIS — R590 Localized enlarged lymph nodes: Secondary | ICD-10-CM | POA: Insufficient documentation

## 2015-07-27 DIAGNOSIS — C7889 Secondary malignant neoplasm of other digestive organs: Secondary | ICD-10-CM | POA: Diagnosis not present

## 2015-07-27 DIAGNOSIS — C799 Secondary malignant neoplasm of unspecified site: Secondary | ICD-10-CM | POA: Diagnosis present

## 2015-07-27 DIAGNOSIS — Z8673 Personal history of transient ischemic attack (TIA), and cerebral infarction without residual deficits: Secondary | ICD-10-CM | POA: Insufficient documentation

## 2015-07-27 DIAGNOSIS — C7800 Secondary malignant neoplasm of unspecified lung: Secondary | ICD-10-CM | POA: Insufficient documentation

## 2015-07-27 DIAGNOSIS — Z9013 Acquired absence of bilateral breasts and nipples: Secondary | ICD-10-CM | POA: Diagnosis not present

## 2015-07-27 DIAGNOSIS — C787 Secondary malignant neoplasm of liver and intrahepatic bile duct: Secondary | ICD-10-CM | POA: Diagnosis not present

## 2015-07-27 MED ORDER — IOHEXOL 300 MG/ML  SOLN
100.0000 mL | Freq: Once | INTRAMUSCULAR | Status: AC | PRN
  Administered 2015-07-27: 100 mL via INTRAVENOUS

## 2015-07-28 ENCOUNTER — Ambulatory Visit: Payer: 59

## 2015-07-28 ENCOUNTER — Other Ambulatory Visit (HOSPITAL_BASED_OUTPATIENT_CLINIC_OR_DEPARTMENT_OTHER): Payer: 59

## 2015-07-28 ENCOUNTER — Ambulatory Visit (HOSPITAL_BASED_OUTPATIENT_CLINIC_OR_DEPARTMENT_OTHER): Payer: 59 | Admitting: Oncology

## 2015-07-28 ENCOUNTER — Telehealth: Payer: Self-pay | Admitting: Oncology

## 2015-07-28 ENCOUNTER — Other Ambulatory Visit: Payer: 59

## 2015-07-28 ENCOUNTER — Ambulatory Visit (HOSPITAL_BASED_OUTPATIENT_CLINIC_OR_DEPARTMENT_OTHER): Payer: 59

## 2015-07-28 VITALS — BP 132/77 | HR 110 | Temp 98.4°F | Resp 18 | Ht 63.0 in | Wt 175.5 lb

## 2015-07-28 DIAGNOSIS — C50312 Malignant neoplasm of lower-inner quadrant of left female breast: Secondary | ICD-10-CM | POA: Diagnosis not present

## 2015-07-28 DIAGNOSIS — C50919 Malignant neoplasm of unspecified site of unspecified female breast: Secondary | ICD-10-CM

## 2015-07-28 DIAGNOSIS — R918 Other nonspecific abnormal finding of lung field: Secondary | ICD-10-CM

## 2015-07-28 DIAGNOSIS — C770 Secondary and unspecified malignant neoplasm of lymph nodes of head, face and neck: Secondary | ICD-10-CM

## 2015-07-28 DIAGNOSIS — D630 Anemia in neoplastic disease: Secondary | ICD-10-CM

## 2015-07-28 DIAGNOSIS — K769 Liver disease, unspecified: Secondary | ICD-10-CM | POA: Diagnosis not present

## 2015-07-28 DIAGNOSIS — Z8673 Personal history of transient ischemic attack (TIA), and cerebral infarction without residual deficits: Secondary | ICD-10-CM

## 2015-07-28 DIAGNOSIS — Z5111 Encounter for antineoplastic chemotherapy: Secondary | ICD-10-CM | POA: Diagnosis not present

## 2015-07-28 DIAGNOSIS — J90 Pleural effusion, not elsewhere classified: Secondary | ICD-10-CM

## 2015-07-28 LAB — CBC WITH DIFFERENTIAL/PLATELET
BASO%: 0.3 % (ref 0.0–2.0)
BASOS ABS: 0 10*3/uL (ref 0.0–0.1)
EOS%: 0.7 % (ref 0.0–7.0)
Eosinophils Absolute: 0.1 10*3/uL (ref 0.0–0.5)
HCT: 29.9 % — ABNORMAL LOW (ref 34.8–46.6)
HEMOGLOBIN: 9.5 g/dL — AB (ref 11.6–15.9)
LYMPH%: 12.9 % — ABNORMAL LOW (ref 14.0–49.7)
MCH: 29.4 pg (ref 25.1–34.0)
MCHC: 31.8 g/dL (ref 31.5–36.0)
MCV: 92.6 fL (ref 79.5–101.0)
MONO#: 1.6 10*3/uL — ABNORMAL HIGH (ref 0.1–0.9)
MONO%: 17.5 % — AB (ref 0.0–14.0)
NEUT#: 6.1 10*3/uL (ref 1.5–6.5)
NEUT%: 68.6 % (ref 38.4–76.8)
Platelets: 213 10*3/uL (ref 145–400)
RBC: 3.23 10*6/uL — ABNORMAL LOW (ref 3.70–5.45)
RDW: 19.2 % — ABNORMAL HIGH (ref 11.2–14.5)
WBC: 8.8 10*3/uL (ref 3.9–10.3)
lymph#: 1.1 10*3/uL (ref 0.9–3.3)

## 2015-07-28 LAB — COMPREHENSIVE METABOLIC PANEL (CC13)
ALBUMIN: 2.5 g/dL — AB (ref 3.5–5.0)
ALT: 29 U/L (ref 0–55)
AST: 38 U/L — AB (ref 5–34)
Alkaline Phosphatase: 166 U/L — ABNORMAL HIGH (ref 40–150)
Anion Gap: 7 mEq/L (ref 3–11)
BUN: 7.7 mg/dL (ref 7.0–26.0)
CO2: 27 mEq/L (ref 22–29)
CREATININE: 0.7 mg/dL (ref 0.6–1.1)
Calcium: 8.6 mg/dL (ref 8.4–10.4)
Chloride: 107 mEq/L (ref 98–109)
EGFR: >90 mL/min/{1.73_m2} (ref >90)
GLUCOSE: 87 mg/dL (ref 70–140)
POTASSIUM: 4.1 meq/L (ref 3.5–5.1)
Sodium: 142 mEq/L (ref 136–145)
Total Bilirubin: 0.47 mg/dL (ref 0.20–1.20)
Total Protein: 5.4 g/dL — ABNORMAL LOW (ref 6.4–8.3)

## 2015-07-28 MED ORDER — SODIUM CHLORIDE 0.9 % IV SOLN
Freq: Once | INTRAVENOUS | Status: AC
  Administered 2015-07-28: 11:00:00 via INTRAVENOUS
  Filled 2015-07-28: qty 4

## 2015-07-28 MED ORDER — SODIUM CHLORIDE 0.9 % IV SOLN
Freq: Once | INTRAVENOUS | Status: AC
  Administered 2015-07-28: 11:00:00 via INTRAVENOUS

## 2015-07-28 MED ORDER — HEPARIN SOD (PORK) LOCK FLUSH 100 UNIT/ML IV SOLN
500.0000 [IU] | Freq: Once | INTRAVENOUS | Status: AC | PRN
  Administered 2015-07-28: 500 [IU]
  Filled 2015-07-28: qty 5

## 2015-07-28 MED ORDER — DARBEPOETIN ALFA 500 MCG/ML IJ SOSY
500.0000 ug | PREFILLED_SYRINGE | Freq: Once | INTRAMUSCULAR | Status: AC
  Administered 2015-07-28: 500 ug via SUBCUTANEOUS
  Filled 2015-07-28: qty 1

## 2015-07-28 MED ORDER — SODIUM CHLORIDE 0.9 % IJ SOLN
10.0000 mL | INTRAMUSCULAR | Status: DC | PRN
  Administered 2015-07-28: 10 mL
  Filled 2015-07-28: qty 10

## 2015-07-28 MED ORDER — SODIUM CHLORIDE 0.9 % IV SOLN
800.0000 mg/m2 | Freq: Once | INTRAVENOUS | Status: AC
  Administered 2015-07-28: 1520 mg via INTRAVENOUS
  Filled 2015-07-28: qty 39.98

## 2015-07-28 MED ORDER — CARBOPLATIN CHEMO INJECTION 450 MG/45ML
200.0000 mg | Freq: Once | INTRAVENOUS | Status: AC
  Administered 2015-07-28: 200 mg via INTRAVENOUS
  Filled 2015-07-28: qty 20

## 2015-07-28 NOTE — Patient Instructions (Signed)
[facility] Discharge Instructions for Patients Receiving Chemotherapy  Today you received the following chemotherapy agents Gemzar, Carboplatin  To help prevent nausea and vomiting after your treatment, we encourage you to take your nausea medication     If you develop nausea and vomiting that is not controlled by your nausea medication, call the clinic.   BELOW ARE SYMPTOMS THAT SHOULD BE REPORTED IMMEDIATELY:  *FEVER GREATER THAN 100.5 F  *CHILLS WITH OR WITHOUT FEVER  NAUSEA AND VOMITING THAT IS NOT CONTROLLED WITH YOUR NAUSEA MEDICATION  *UNUSUAL SHORTNESS OF BREATH  *UNUSUAL BRUISING OR BLEEDING  TENDERNESS IN MOUTH AND THROAT WITH OR WITHOUT PRESENCE OF ULCERS  *URINARY PROBLEMS  *BOWEL PROBLEMS  UNUSUAL RASH Items with * indicate a potential emergency and should be followed up as soon as possible.  Feel free to call the clinic you have any questions or concerns. The clinic phone number is [phone].  Please show the CHEMO ALERT CARD at check-in to the Emergency Department and triage nurse.   

## 2015-07-28 NOTE — Progress Notes (Signed)
ID: Kristin Hoffman OB: [birth year]  MR#: [id number]  WUJ#:[id number]  PCP: Drake Leach, MD GYN:  Kristin Hoffman SU: Kristin Hoffman OTHER MD: Kristin Hoffman, Kristin Hoffman  CHIEF COMPLAINT:  Stage IV breast cancer  CURRENT TREATMENT: carboplatin/ gemcitabine/ aranesp  BREAST CANCER HISTORY: This patient was previously followed by Kristin Hoffman, and was transferred to Kristin Hoffman service as of 08/20/2013.  At the age of 65, the patient had a screening mammogram as a baseline. She had recently given birth and was on birth control pills at that time. The mammogram in November 2006 showed an area of architectural distortion in the left lower inner quadrant. Subsequently an ultrasound was obtained of the left breast showing a 2.0 x 1.5 x 1.4 cm mass, at the 8:00 position, 4 cm from the nipple. A core biopsy performed on 10/21/2005 showed an invasive mammary carcinoma, ER +70%, PR +41%, HER-2/neu negative, with proliferation marker of 38%. [phone])  Breast MRI on 11/08/2005 confirmed a 2.5 cm spiculated mass in the left lower inner quadrant with 3 small satellite nodules adjacent to the primary mass: 3.5 cm posterior medial to the primary mass, measuring 9 mm; 1.5 cm anterior medial to the primary mass measuring 5 mm; and 2 cm medial to the primary mass measuring 5 mm. No axillary adenopathy was noted. No suspicious masses or enhancement are noted in the right breast.  Kristin Hoffman underwentt left lumpectomy under the care of Kristin Hoffman on 11/18/2005 for a 2.5 cm grade 3 invasive ductal carcinoma, ER +70%, PR +41%, HER-2/neu negative, with proliferation marker of 38%. 2 of 23 lymph nodes were involved.  Margins were clear.  She received adjuvant chemotherapy consisting of 6 q. three-week doses of docetaxel/doxorubicin/cyclophosphamide given between January 2007 and may of 2007, with last dose on 04/20/2006. She underwent radiation therapy completed 07/11/2006, after which she began on tamoxifen in  early August 2007.  She underwent hysterectomy with bilateral salpingo-oophorectomy on 07/15/2008, and was started on letrozole in October of 2009.  A mammogram 11/10/2009 showed microcalcifications in the left breast, and a subsequent biopsy on 11/18/2009 confirmed invasive mammary carcinoma in the left breast. PET and CT of the chest showed no evidence of metastasis in January 2011.  She underwent bilateral mastectomies 01/25/2010, with the right breast showing no evidence of malignancy; in the left breast showing a 1.0 cm grade 2 invasive ductal carcinoma with high-grade DCIS. Tumor was ER +22%, PR negative, HER-2/neu negative, with proliferation marker of 13%. Margins were clear. Patient underwent concurrent left latissimus flap reconstruction and right implant reconstruction.  She received additional chemotherapy with one cycle of carboplatin/gemcitabine given on 03/11/2010. She then received 5 q. three-week cycles of IV CMF between 03/25/2010 and 06/17/2010.  She was started on exemestane in January 2012 and continued until late November 2013 when she was hospitalized for apparent TIA.   Her subsequent history is as detailed below  INTERVAL HISTORY: Kristin Hoffman returns today for follow up of her metastatic breast cancer, accompanied by her husband Kristin Hoffman. Today is day 1 cycle 4 of 6 planned cycles of carboplatin and gemcitabine given on days 1 and 8 of each cycle. She receives Neupogen on days 2,  3 and 4 of each cycle and then onpro on day 9. Carboplatin dose has been reduced to AUC 1.5 due to previous thrombocytopenia. -- Since her last visit here Kristin Hoffman has had her restaging studies which show measurable response area   REVIEW OF SYSTEMS: Kristin Hoffman has felt better overall although her  functional status remains poor. She sleeps well at night and she also takes a nap most afternoons after lunch, sometimes up to 3 hours. Her husband and children went to [place] for a couple of days and she pretty much slept  during that time. On the other hand her breathing is better. She uses oxygen at night but frequently not during the day. She has actually been able to drive a couple of times. Generally she feels "good". Her back does hurt but that is well controlled with tramadol and Advil. She has a minimal dry cough, no pleurisy, no hemoptysis. She does bruise easily. There have been no unusual headaches, visual changes, nausea, or vomiting. A detailed review of systems today was otherwise stable.  PAST MEDICaL HISTORY: Past Medical History  Diagnosis Date  . Depression   . Reflux   . Hx-TIA (transient ischemic attack) 11/22/2013  . Chronic headaches 11/22/2013  . Anxiety 11/22/2013  . Breast cancer   . Cancer   . Breast cancer metastasized to multiple sites 12/24/2013    PAST SURGICAL HISTORY: Past Surgical History  Procedure Laterality Date  . Mastectomy w/ nodes partial    . Mastectomy    . Breast reconstruction    . Portacath placement      x 2  . Portacath removal      x 2  . Abdominal hysterectomy    . Tee without cardioversion  11/12/2012    Procedure: TRANSESOPHAGEAL ECHOCARDIOGRAM (TEE);  Surgeon: Kristin Furbish, MD;  Location: [facility] ENDOSCOPY;  Service: Cardiovascular;  Laterality: N/A;  This TEE may be Dr. Marlou Porch or a LHC Dr    FAMILY HISTORY Both parents are alive and well. Patient has one sister who is 84 years younger and is in good health. No other history of breast or ovarian cancer in the family. Family History  Problem Relation Age of Onset  . Diabetes Mellitus II Neg Hx   . Hypertension Neg Hx     GYNECOLOGIC HISTORY:   (Updated January 2015) G2P2, menarche at age 29 with irregular menses. On birth control pills in the past. On Clomid to induce ovulation with first pregnancy. Also had preeclampsia with first pregnancy. Status post hysterectomy and bilateral salpingo-oophorectomy in August 2009.  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 She is originally from [place]. She's been married to Kristin Hoffman, for 13 years. He works as an Pharmacist, hospital at [employer].   ADVANCED DIRECTIVES:  Not in place; Not in place. At the clinic visit 07/28/2015 the patient was given the appropriate forms to complete and notarize at her discretion.    HEALTH MAINTENANCE:  (Updated 11/22/2013) Social History  Substance Use Topics  . Smoking status: Never Smoker   . Smokeless tobacco: Never Used  . Alcohol Use: Yes     Comment: rarely     Colonoscopy:  Never  PAP: S/P LAVH/BSO in August 2009  Bone density: Never  Lipid panel: Not on file   Allergies  Allergen Reactions  . Afinitor [Everolimus] Hives and Itching    Current Outpatient Prescriptions  Medication Sig Dispense Refill  . acidophilus (RISAQUAD) CAPS capsule Take 1 capsule by mouth every morning.     Marland Kitchen aspirin EC 81 MG tablet Take 81 mg by mouth daily with breakfast.    . Biotin 5000 MCG CAPS Take 5,000 mcg by mouth every morning.  30 capsule   .  buPROPion (WELLBUTRIN XL) 150 MG 24 hr tablet Take 1 tablet (150 mg total) by mouth daily. (Patient not taking: Reported on 06/23/2015) 30 tablet 12  . cetirizine (ZYRTEC) 10 MG tablet Take 10 mg by mouth at bedtime.     . Cholecalciferol 2000 UNITS CHEW Chew 1 tablet by mouth daily with breakfast.     . docusate sodium (COLACE) 100 MG capsule Take 1 capsule (100 mg total) by mouth 2 (two) times daily. (Patient not taking: Reported on 07/13/2015) 20 capsule 0  . escitalopram (LEXAPRO) 20 MG tablet Take 1 tablet (20 mg total) by mouth daily. 30 tablet 12  . filgrastim (NEUPOGEN) 300 MCG/0.5ML SOSY injection Inject 0.5 mLs (300 mcg total) into the skin once. (Patient not taking: Reported on 07/13/2015) 6 Syringe 1  . Ibuprofen (ADVIL) 200 MG CAPS Take 2 capsules (400 mg total) by mouth 3 (three) times daily with meals as needed. (Patient taking differently: Take 400 mg by mouth every 6  (six) hours as needed (for pain). ) 120 each 0  . LORazepam (ATIVAN) 0.5 MG tablet Take 1 tablet (0.5 mg total) by mouth every 6 (six) hours as needed for anxiety. 120 tablet 1  . Melatonin 10 MG TABS Take 1 tablet by mouth at bedtime.    . montelukast (SINGULAIR) 10 MG tablet Take 10 mg by mouth daily with breakfast.     . Multiple Vitamin (MULTIVITAMIN) tablet Take 1 tablet by mouth every morning.     . nystatin (MYCOSTATIN) 100000 UNIT/ML suspension Take 5 mLs (500,000 Units total) by mouth 4 (four) times daily. (Patient not taking: Reported on 07/13/2015) 240 mL 1  . omeprazole (PRILOSEC) 40 MG capsule Take 40 mg by mouth every morning.     . ondansetron (ZOFRAN) 8 MG tablet Take 8 mg by mouth 2 (two) times daily. Starts day after chemo    . prochlorperazine (COMPAZINE) 10 MG tablet Take 10 mg by mouth every 6 (six) hours as needed for nausea or vomiting.     . traMADol (ULTRAM) 50 MG tablet Take 1 tablet (50 mg total) by mouth every 6 (six) hours as needed. 120 tablet 1   No current facility-administered medications for this visit.    OBJECTIVE: Young white woman who appears stated age 37 Vitals:   07/28/15 0919  BP: 132/77  Pulse: 110  Temp: 98.4 F (36.9 C)  Resp: 18  Body mass index is 31.1 kg/(m^2).  ECOG: 2-3 Filed Weights   07/28/15 0919  Weight: 175 lb 8 oz (79.606 kg)   Sclerae unicteric, pupils round and equal Oropharynx clear and moist-- no thrush or other lesions No cervical or supraclavicular adenopathy Lungs no rales or rhonchi Heart regular rate and rhythm, no wheezes appreciated Abd soft, nontender, positive bowel sounds MSK no focal spinal tenderness, no upper extremity lymphedema Neuro: nonfocal, well oriented, appropriate affect Breasts: Deferred     LAB RESULTS:   Lab Results  Component Value Date   WBC 8.8 07/28/2015   NEUTROABS 6.1 07/28/2015   HGB 9.5* 07/28/2015   HCT 29.9* 07/28/2015   MCV 92.6 07/28/2015   PLT 213 07/28/2015       Chemistry      Component Value Date/Time   NA 145 07/20/2015 1002   NA 138 05/30/2015 0525   K 3.8 07/20/2015 1002   K 3.7 05/30/2015 0525   CL 104 05/30/2015 0525   CL 104 11/14/2012 1407   CO2 28 07/20/2015 1002   CO2 27 05/30/2015 0525  BUN 13.9 07/20/2015 1002   BUN 16 05/30/2015 0525   CREATININE 0.6 07/20/2015 1002   CREATININE 0.68 05/30/2015 0525      Component Value Date/Time   CALCIUM 8.4 07/20/2015 1002   CALCIUM 8.2* 05/30/2015 0525   ALKPHOS 180* 07/20/2015 1002   ALKPHOS 88 05/30/2015 0525   AST 34 07/20/2015 1002   AST 193* 05/30/2015 0525   ALT 39 07/20/2015 1002   ALT 76* 05/30/2015 0525   BILITOT 0.75 07/20/2015 1002   BILITOT 1.0 05/30/2015 0525      STUDIES: Ct Chest W Contrast  07/27/2015   CLINICAL DATA:  Recurrent left breast cancer status post bilateral mastectomy, chemotherapy in progress  EXAM: CT CHEST, ABDOMEN, AND PELVIS WITH CONTRAST  TECHNIQUE: Multidetector CT imaging of the chest, abdomen and pelvis was performed following the standard protocol during bolus administration of intravenous contrast.  CONTRAST:  127m OMNIPAQUE IOHEXOL 300 MG/ML  SOLN  COMPARISON:  CTA chest dated 05/29/2015. CT chest abdomen pelvis dated 05/22/2015.  FINDINGS: CT CHEST FINDINGS  Mediastinum/Nodes: Heart is normal in size. No pericardial effusion.  Aberrant right subclavian artery. Right chest port terminates in the upper right atrium.  Necrotic thoracic nodal metastases are mildly improved, including:  --2.5 x 2.8 cm left supraclavicular node (series 2/ image 51), previously 2.6 x 3.4 cm  --1.8 x 2.1 cm high right paratracheal node (series 2/image 12), previously 2.5 x 2.6 cm  --2.3 x 2.7 cm left prevascular node (series 2/image 16), previously 3.8 x 4.3 cm  Additional small calcified mediastinal nodes, benign. Status post left axillary lymph node dissection.  Visualized thyroid is unremarkable.  Lungs/Pleura: 1.8 x 2.3 cm necrotic mass along the anterior right middle  lobe (series 2/image 30), previously 3.3 cm. Associated trace right pleural effusion.  7.8 x 4.0 cm mass along the anterior left upper lobe/chest wall (series 2/ image 18), previously 8.2 x 5.6 cm. Associated small left pleural effusion, some of which is loculated anteriorly along the left chest wall mass.  Again noted is interlobular septal thickening with scattered nodularity, worrisome for lymphangitic spread of tumor. Discrete pulmonary nodularity is less evident on the current study and favored to be improved. A 6 mm pulmonary nodules present in the left lung apex (series 4/image 11) previously 7 mm.  No pneumothorax.  Musculoskeletal: Status post bilateral mastectomy with reconstruction.  Mild degenerative changes of the thoracic spine. No focal osseous lesions.  CT ABDOMEN PELVIS FINDINGS  Hepatobiliary: Vague, heterogeneously enhancing lesion in the anterior right hepatic dome is difficult to discretely measure on the current study but measures approximately 5.1 x 5.6 cm, previously 6.9 x 7.6 cm.  2.5 x 1.8 cm hypoenhancing lesion in the caudate, previously 2.7 x 2.2 cm.  Additional areas of heterogeneous perfusion.  Gallbladder is decompressed. No intrahepatic or extrahepatic ductal dilatation.  Pancreas: Pancreas is notable for a 2.9 x 2.9 cm hypoenhancing mass in the pancreatic head (series 2/ image 58), previously 4.3 x 3.2 cm. Associated atrophy the pancreatic body/ tail.  Spleen: Within normal limits.  Adrenals/Urinary Tract: Adrenal glands are within normal limits.  Kidneys are within normal limits.  No hydronephrosis.  Bladder is mildly thick-walled although underdistended.  Stomach/Bowel: Stomach is within normal limits.  No evidence of bowel obstruction.  Normal appendix.  Vascular/Lymphatic: No evidence of abdominal aortic aneurysm.  Circumaortic left renal vein.  Previously described portacaval lymphadenopathy is difficult to distinguish from the adjacent liver on the current study. Otherwise, no  suspicious abdominopelvic lymphadenopathy.  Reproductive: Status post hysterectomy.  No adnexal mass.  Other: No abdominopelvic ascites.  Musculoskeletal: Visualized osseous structures are within normal limits.  IMPRESSION: Status post bilateral mastectomy with left axillary lymph node dissection.  Necrotic thoracic lymphadenopathy, mildly improved.  Pleural/pulmonary metastases with lymphangitic spread, as described above, improved.  Hepatic and pancreatic metastases, improved.   Electronically Signed   By: Julian Hy M.D.   On: 07/27/2015 15:35   Ct Abdomen Pelvis W Contrast  07/27/2015   CLINICAL DATA:  Recurrent left breast cancer status post bilateral mastectomy, chemotherapy in progress  EXAM: CT CHEST, ABDOMEN, AND PELVIS WITH CONTRAST  TECHNIQUE: Multidetector CT imaging of the chest, abdomen and pelvis was performed following the standard protocol during bolus administration of intravenous contrast.  CONTRAST:  113m OMNIPAQUE IOHEXOL 300 MG/ML  SOLN  COMPARISON:  CTA chest dated 05/29/2015. CT chest abdomen pelvis dated 05/22/2015.  FINDINGS: CT CHEST FINDINGS  Mediastinum/Nodes: Heart is normal in size. No pericardial effusion.  Aberrant right subclavian artery. Right chest port terminates in the upper right atrium.  Necrotic thoracic nodal metastases are mildly improved, including:  --2.5 x 2.8 cm left supraclavicular node (series 2/ image 51), previously 2.6 x 3.4 cm  --1.8 x 2.1 cm high right paratracheal node (series 2/image 12), previously 2.5 x 2.6 cm  --2.3 x 2.7 cm left prevascular node (series 2/image 16), previously 3.8 x 4.3 cm  Additional small calcified mediastinal nodes, benign. Status post left axillary lymph node dissection.  Visualized thyroid is unremarkable.  Lungs/Pleura: 1.8 x 2.3 cm necrotic mass along the anterior right middle lobe (series 2/image 30), previously 3.3 cm. Associated trace right pleural effusion.  7.8 x 4.0 cm mass along the anterior left upper lobe/chest  wall (series 2/ image 18), previously 8.2 x 5.6 cm. Associated small left pleural effusion, some of which is loculated anteriorly along the left chest wall mass.  Again noted is interlobular septal thickening with scattered nodularity, worrisome for lymphangitic spread of tumor. Discrete pulmonary nodularity is less evident on the current study and favored to be improved. A 6 mm pulmonary nodules present in the left lung apex (series 4/image 11) previously 7 mm.  No pneumothorax.  Musculoskeletal: Status post bilateral mastectomy with reconstruction.  Mild degenerative changes of the thoracic spine. No focal osseous lesions.  CT ABDOMEN PELVIS FINDINGS  Hepatobiliary: Vague, heterogeneously enhancing lesion in the anterior right hepatic dome is difficult to discretely measure on the current study but measures approximately 5.1 x 5.6 cm, previously 6.9 x 7.6 cm.  2.5 x 1.8 cm hypoenhancing lesion in the caudate, previously 2.7 x 2.2 cm.  Additional areas of heterogeneous perfusion.  Gallbladder is decompressed. No intrahepatic or extrahepatic ductal dilatation.  Pancreas: Pancreas is notable for a 2.9 x 2.9 cm hypoenhancing mass in the pancreatic head (series 2/ image 58), previously 4.3 x 3.2 cm. Associated atrophy the pancreatic body/ tail.  Spleen: Within normal limits.  Adrenals/Urinary Tract: Adrenal glands are within normal limits.  Kidneys are within normal limits.  No hydronephrosis.  Bladder is mildly thick-walled although underdistended.  Stomach/Bowel: Stomach is within normal limits.  No evidence of bowel obstruction.  Normal appendix.  Vascular/Lymphatic: No evidence of abdominal aortic aneurysm.  Circumaortic left renal vein.  Previously described portacaval lymphadenopathy is difficult to distinguish from the adjacent liver on the current study. Otherwise, no suspicious abdominopelvic lymphadenopathy.  Reproductive: Status post hysterectomy.  No adnexal mass.  Other: No abdominopelvic ascites.   Musculoskeletal: Visualized osseous structures are within normal  limits.  IMPRESSION: Status post bilateral mastectomy with left axillary lymph node dissection.  Necrotic thoracic lymphadenopathy, mildly improved.  Pleural/pulmonary metastases with lymphangitic spread, as described above, improved.  Hepatic and pancreatic metastases, improved.   Electronically Signed   By: Julian Hy M.D.   On: 07/27/2015 15:35     ASSESSMENT: 49 y.o. 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 2 of 23 lymph nodes were involved.  Margins were clear.  (2) status post adjuvant chemotherapy consisting of 6 q. three-week doses of docetaxel/ doxorubicin/ cyclophosphamide given between January 2007 and may of 2007, with last dose on 04/20/2006.  (3) status post radiation therapy to the left breast, completed 07/11/2006  (4) began on tamoxifen in early August 2007 and continued until October 2009. She status post hysterectomy with bilateral salpingo-oophorectomy on 07/15/2008, and was started on letrozole in October of 2009.  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 PET and CT of the chest showed no evidence of metastasis in January 2011.  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 Margins were clear. Patient underwent concurrent left latissimus flap reconstruction and right implant reconstruction.  (7)  status post additional chemotherapy with one cycle of carboplatin/gemcitabine given on 03/11/2010. She then  received 5 q. three-week cycles of IV CMF between 03/25/2010 and 06/17/2010.  (8) started on exemestane in January 2012 and continued until late November 2013 when she was hospitalized for apparent TIA at which time her exemestane was stopped and not resumed  (9) METASTATIC DISEASE: evidence of disease recurence noted on chest CT, liver MRI and PET scan in December 2014, with suspicious lung nodules, lymphadenopathy, and 3 lesions in the right lobe of the liver, but no evidence of bony disease and no evidence of brain involvement by brain MRI September 2014. Biopsy of a left supraclavicular lymph node on 12/11/2013 confirmed metastatic carcinoma, estrogen receptor 45% positive, progesterone receptor 17% positive, with no HER-2 amplification.  (10) fulvestrant started 12/03/2013, discontinued 03/25/2014 with progression  (11) exemestane/ everolimus started 04/04/2014; everolimus held 04/14/2014, with skin rash and mouth soreness; resumed at 5 mg a day as of 04/29/2014, discontinued 05/09/2014 with similar symptoms  (12) continueing exemestane, adding palbociclib 05/26/2014;   (a) palbociclib dose dropped to 75 mg every other day for 21 days beginning with cycle 4  (b) exemestane and Palbociclib discontinued December 2015 with evidence of progression  (13) [facility] referral placed 04/28/2049 -- re-sent 05/23/2014-- shows Pi3KCA and TP53 mutations  (a) [facility] one test requested 12/10/2014-- confirms [employer] results  (b) did not qualify for capecitabine/ BYL 719 trial because of elevated lipase  (14) started eribulin 12/26/2014, 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   (a) dpse dropped from 2g BID to 1.5g BID starting on 04/26/15  (b) stopped 05/17/2015 (after 4 cycles) with progression  (16) started carboplatin/  gemcitabine 05/26/2015, to be given day one and day 8  of each 21 day cycle  (a) given her history of severe neutropenia with this chemotherapy we'll do Neupogen days 2, 3 and 4 of each cycle and onpro day 9  (b) carbo dose dropped by 25% w cycle 2 due to very low platelet nadir  (c) CT scans after 3 cycles (07/27/2015) show measurable response  (17) Left pleural effusion\   (a) s/p L thoracentesis 05/22/2015, 05/29/2015 and 06/05/2015  (b) attempt at pleurx 06/12/2015 cancelled as effusion loculated and scant  (18) symptomatic anemia  (a) Aranesp started 07/28/2015  (b) Feraheme to be given 08/04/2015   PLAN:  I spent approximately one hour today with Areliz and her husband Kristin Hoffman going over her situation. Ailis's cancer shows a measurable response to her current treatment. This is very favorable. It is confirmed by the fact that she is clinically improved, with less shortness of breath and a little bit improved functional status. She still very limited in what she can do and part of that at least will be due to her significant anemia problems.  I am encouraging her to do whatever little exercise she can get away with even if it's only 5 minutes at a time.  She somehow thought the alkaline phosphatase showed her body was alkaline and that being alkaline was good for cancer. We had a lengthy discussion regarding acid-base and the bottom line is of course that that test as per little to do with acid-base and we have no reason to suspect that her acid-base level is off.  We are owing to continue the current chemotherapy, except I am going to drop the carboplatin dose on day 8 another 10% to see if we can avoid the very severe platelet nadirs. Just in case we are going to be checking her platelet count and general CBC on day 14 of each cycle and set her up for transfusion those days as needed.  To improve the anemia and minimize transfusions she will be started on Aranesp every 3 weeks with the first  dose given today.  She understands that Aleve and Motrin can affect platelet function so when her platelet count is likely to be low she should switch to Tylenol. Otherwise she is getting good analgesia from the nonsteroidals.  I'm setting her up for her multiple visits and treatments and she will see me again specifically on October 18. She will have repeat CT scans the day before. Of course we will continue to see her on a regular basis so if there is any evidence of clinical deterioration we will reassess earlier.  Ellesse has a good understanding of this plan. She agrees with it. She knows a goal of treatment in her case is control. She will call with any problems that may develop before her next visit here.   Chauncey Cruel, MD   07/28/2015 9:35 AM

## 2015-07-28 NOTE — Telephone Encounter (Signed)
Appointments made per pof and patient will get a new avs in chemo today

## 2015-07-29 ENCOUNTER — Ambulatory Visit (HOSPITAL_BASED_OUTPATIENT_CLINIC_OR_DEPARTMENT_OTHER): Payer: 59

## 2015-07-29 VITALS — BP 115/65 | HR 112 | Temp 98.9°F

## 2015-07-29 DIAGNOSIS — C770 Secondary and unspecified malignant neoplasm of lymph nodes of head, face and neck: Secondary | ICD-10-CM | POA: Diagnosis not present

## 2015-07-29 DIAGNOSIS — Z5189 Encounter for other specified aftercare: Secondary | ICD-10-CM

## 2015-07-29 DIAGNOSIS — C50312 Malignant neoplasm of lower-inner quadrant of left female breast: Secondary | ICD-10-CM

## 2015-07-29 DIAGNOSIS — C50919 Malignant neoplasm of unspecified site of unspecified female breast: Secondary | ICD-10-CM

## 2015-07-29 MED ORDER — TBO-FILGRASTIM 300 MCG/0.5ML ~~LOC~~ SOSY
300.0000 ug | PREFILLED_SYRINGE | Freq: Once | SUBCUTANEOUS | Status: AC
  Administered 2015-07-29: 300 ug via SUBCUTANEOUS
  Filled 2015-07-29: qty 0.5

## 2015-07-30 ENCOUNTER — Ambulatory Visit (HOSPITAL_BASED_OUTPATIENT_CLINIC_OR_DEPARTMENT_OTHER): Payer: 59

## 2015-07-30 VITALS — BP 125/72 | HR 104 | Temp 99.1°F

## 2015-07-30 DIAGNOSIS — C50312 Malignant neoplasm of lower-inner quadrant of left female breast: Secondary | ICD-10-CM

## 2015-07-30 DIAGNOSIS — C770 Secondary and unspecified malignant neoplasm of lymph nodes of head, face and neck: Secondary | ICD-10-CM | POA: Diagnosis not present

## 2015-07-30 DIAGNOSIS — C50919 Malignant neoplasm of unspecified site of unspecified female breast: Secondary | ICD-10-CM

## 2015-07-30 DIAGNOSIS — Z5189 Encounter for other specified aftercare: Secondary | ICD-10-CM | POA: Diagnosis not present

## 2015-07-30 MED ORDER — TBO-FILGRASTIM 300 MCG/0.5ML ~~LOC~~ SOSY
300.0000 ug | PREFILLED_SYRINGE | Freq: Once | SUBCUTANEOUS | Status: AC
  Administered 2015-07-30: 300 ug via SUBCUTANEOUS
  Filled 2015-07-30: qty 0.5

## 2015-07-31 ENCOUNTER — Ambulatory Visit (HOSPITAL_BASED_OUTPATIENT_CLINIC_OR_DEPARTMENT_OTHER): Payer: 59

## 2015-07-31 VITALS — BP 117/74 | HR 102 | Temp 99.3°F

## 2015-07-31 DIAGNOSIS — Z5189 Encounter for other specified aftercare: Secondary | ICD-10-CM

## 2015-07-31 DIAGNOSIS — C770 Secondary and unspecified malignant neoplasm of lymph nodes of head, face and neck: Secondary | ICD-10-CM

## 2015-07-31 DIAGNOSIS — C50312 Malignant neoplasm of lower-inner quadrant of left female breast: Secondary | ICD-10-CM

## 2015-07-31 DIAGNOSIS — C50919 Malignant neoplasm of unspecified site of unspecified female breast: Secondary | ICD-10-CM

## 2015-07-31 MED ORDER — TBO-FILGRASTIM 300 MCG/0.5ML ~~LOC~~ SOSY
300.0000 ug | PREFILLED_SYRINGE | Freq: Once | SUBCUTANEOUS | Status: AC
  Administered 2015-07-31: 300 ug via SUBCUTANEOUS
  Filled 2015-07-31: qty 0.5

## 2015-08-04 ENCOUNTER — Ambulatory Visit: Payer: 59

## 2015-08-04 ENCOUNTER — Other Ambulatory Visit (HOSPITAL_BASED_OUTPATIENT_CLINIC_OR_DEPARTMENT_OTHER): Payer: 59

## 2015-08-04 ENCOUNTER — Encounter: Payer: Self-pay | Admitting: Nurse Practitioner

## 2015-08-04 ENCOUNTER — Ambulatory Visit (HOSPITAL_BASED_OUTPATIENT_CLINIC_OR_DEPARTMENT_OTHER): Payer: 59 | Admitting: Nurse Practitioner

## 2015-08-04 ENCOUNTER — Ambulatory Visit (HOSPITAL_BASED_OUTPATIENT_CLINIC_OR_DEPARTMENT_OTHER): Payer: 59

## 2015-08-04 ENCOUNTER — Other Ambulatory Visit: Payer: 59

## 2015-08-04 VITALS — BP 128/80 | HR 96 | Temp 98.5°F | Resp 16

## 2015-08-04 DIAGNOSIS — L03116 Cellulitis of left lower limb: Secondary | ICD-10-CM

## 2015-08-04 DIAGNOSIS — C50312 Malignant neoplasm of lower-inner quadrant of left female breast: Secondary | ICD-10-CM

## 2015-08-04 DIAGNOSIS — Z5189 Encounter for other specified aftercare: Secondary | ICD-10-CM | POA: Diagnosis not present

## 2015-08-04 DIAGNOSIS — R112 Nausea with vomiting, unspecified: Secondary | ICD-10-CM

## 2015-08-04 DIAGNOSIS — Z5111 Encounter for antineoplastic chemotherapy: Secondary | ICD-10-CM

## 2015-08-04 DIAGNOSIS — C770 Secondary and unspecified malignant neoplasm of lymph nodes of head, face and neck: Secondary | ICD-10-CM | POA: Diagnosis not present

## 2015-08-04 DIAGNOSIS — C50919 Malignant neoplasm of unspecified site of unspecified female breast: Secondary | ICD-10-CM

## 2015-08-04 DIAGNOSIS — L0390 Cellulitis, unspecified: Secondary | ICD-10-CM | POA: Insufficient documentation

## 2015-08-04 LAB — CBC WITH DIFFERENTIAL/PLATELET
BASO%: 1 % (ref 0.0–2.0)
Basophils Absolute: 0 10*3/uL (ref 0.0–0.1)
EOS%: 0.5 % (ref 0.0–7.0)
Eosinophils Absolute: 0 10*3/uL (ref 0.0–0.5)
HCT: 28.8 % — ABNORMAL LOW (ref 34.8–46.6)
HGB: 9.3 g/dL — ABNORMAL LOW (ref 11.6–15.9)
LYMPH%: 17 % (ref 14.0–49.7)
MCH: 29 pg (ref 25.1–34.0)
MCHC: 32.1 g/dL (ref 31.5–36.0)
MCV: 90.4 fL (ref 79.5–101.0)
MONO#: 0.7 10*3/uL (ref 0.1–0.9)
MONO%: 14.8 % — ABNORMAL HIGH (ref 0.0–14.0)
NEUT#: 3.3 10*3/uL (ref 1.5–6.5)
NEUT%: 66.7 % (ref 38.4–76.8)
PLATELETS: 138 10*3/uL — AB (ref 145–400)
RBC: 3.19 10*6/uL — AB (ref 3.70–5.45)
RDW: 18.8 % — ABNORMAL HIGH (ref 11.2–14.5)
WBC: 4.9 10*3/uL (ref 3.9–10.3)
lymph#: 0.8 10*3/uL — ABNORMAL LOW (ref 0.9–3.3)

## 2015-08-04 LAB — COMPREHENSIVE METABOLIC PANEL (CC13)
ALT: 33 U/L (ref 0–55)
ANION GAP: 8 meq/L (ref 3–11)
AST: 37 U/L — ABNORMAL HIGH (ref 5–34)
Albumin: 2.6 g/dL — ABNORMAL LOW (ref 3.5–5.0)
Alkaline Phosphatase: 172 U/L — ABNORMAL HIGH (ref 40–150)
BUN: 10.1 mg/dL (ref 7.0–26.0)
CHLORIDE: 106 meq/L (ref 98–109)
CO2: 28 meq/L (ref 22–29)
Calcium: 8.6 mg/dL (ref 8.4–10.4)
Creatinine: 0.6 mg/dL (ref 0.6–1.1)
Glucose: 115 mg/dl (ref 70–140)
POTASSIUM: 4.5 meq/L (ref 3.5–5.1)
Sodium: 142 mEq/L (ref 136–145)
Total Bilirubin: 0.42 mg/dL (ref 0.20–1.20)
Total Protein: 5.3 g/dL — ABNORMAL LOW (ref 6.4–8.3)

## 2015-08-04 MED ORDER — SODIUM CHLORIDE 0.9 % IV SOLN
Freq: Once | INTRAVENOUS | Status: AC
  Administered 2015-08-04: 12:00:00 via INTRAVENOUS

## 2015-08-04 MED ORDER — SODIUM CHLORIDE 0.9 % IV SOLN
800.0000 mg/m2 | Freq: Once | INTRAVENOUS | Status: AC
  Administered 2015-08-04: 1520 mg via INTRAVENOUS
  Filled 2015-08-04: qty 39.98

## 2015-08-04 MED ORDER — SODIUM CHLORIDE 0.9 % IV SOLN
175.1100 mg | Freq: Once | INTRAVENOUS | Status: AC
  Administered 2015-08-04: 180 mg via INTRAVENOUS
  Filled 2015-08-04: qty 18

## 2015-08-04 MED ORDER — SODIUM CHLORIDE 0.9 % IV SOLN
Freq: Once | INTRAVENOUS | Status: AC
  Administered 2015-08-04: 12:00:00 via INTRAVENOUS
  Filled 2015-08-04: qty 4

## 2015-08-04 MED ORDER — SODIUM CHLORIDE 0.9 % IJ SOLN
10.0000 mL | INTRAMUSCULAR | Status: DC | PRN
  Administered 2015-08-04: 10 mL
  Filled 2015-08-04: qty 10

## 2015-08-04 MED ORDER — PEGFILGRASTIM 6 MG/0.6ML ~~LOC~~ PSKT
6.0000 mg | PREFILLED_SYRINGE | Freq: Once | SUBCUTANEOUS | Status: AC
  Administered 2015-08-04: 6 mg via SUBCUTANEOUS
  Filled 2015-08-04: qty 0.6

## 2015-08-04 MED ORDER — HEPARIN SOD (PORK) LOCK FLUSH 100 UNIT/ML IV SOLN
500.0000 [IU] | Freq: Once | INTRAVENOUS | Status: AC | PRN
  Administered 2015-08-04: 500 [IU]
  Filled 2015-08-04: qty 5

## 2015-08-04 MED ORDER — CEPHALEXIN 500 MG PO CAPS: 500.0000 mg | ORAL_CAPSULE | Freq: Four times a day (QID) | ORAL | Status: DC

## 2015-08-04 MED ORDER — SODIUM CHLORIDE 0.9 % IV SOLN
510.0000 mg | Freq: Once | INTRAVENOUS | Status: AC
  Administered 2015-08-04: 510 mg via INTRAVENOUS
  Filled 2015-08-04: qty 17

## 2015-08-04 NOTE — Assessment & Plan Note (Signed)
Patient states that she bumped her left anterior leg a few days ago; and developed a bruise/hematoma to the site.  She reports that she applied a natural oil to the bruise/hematoma just just today; and now has erythema and mild warmth to the site as well.  She denies any recent fevers or chills.  On exam.  It does appear the patient has an approximately 2 cm in diameter hematoma to her left anterior shin area.  There is some erythema with surrounding mild edema and warmth to the site.  There are no red streaks.  The new onset erythema that developed within the past 24 hours could very well be associated with the application of the natural oils; but could also be the initial stages of some mild cellulitis.  Patient will be prescribed Keflex antibiotics for treatment of mild cellulitis.  Advised patient to let us know if site does not improve or worsens whatsoever.

## 2015-08-04 NOTE — Progress Notes (Signed)
SYMPTOM MANAGEMENT CLINIC   HPI: Kristin Hoffman 49 y.o. female diagnosed with breast cancer.  Patient is status post mastectomy.  Currently undergoing carboplatin/gemcitabine/Aranesp therapy.  Patient states that she bumped her left anterior leg a few days ago; and developed a bruise/hematoma to the site.  She reports that she applied a natural oil to the bruise/hematoma just just today; and now has erythema and mild warmth to the site as well.  She denies any recent fevers or chills.  HPI  ROS  Past Medical History  Diagnosis Date  . Depression   . Reflux   . Hx-TIA (transient ischemic attack) 11/22/2013  . Chronic headaches 11/22/2013  . Anxiety 11/22/2013  . Breast cancer   . Cancer   . Breast cancer metastasized to multiple sites 12/24/2013    Past Surgical History  Procedure Laterality Date  . Mastectomy w/ nodes partial    . Mastectomy    . Breast reconstruction    . Portacath placement      x 2  . Portacath removal      x 2  . Abdominal hysterectomy    . Tee without cardioversion  11/12/2012    Procedure: TRANSESOPHAGEAL ECHOCARDIOGRAM (TEE);  Surgeon: Candee Furbish, MD;  Location: [facility] ENDOSCOPY;  Service: Cardiovascular;  Laterality: N/A;  This TEE may be Dr. Marlou Porch or a [facility] Dr    has Hx-TIA (transient ischemic attack); Chronic headaches; Anxiety; Cancer of lower-inner quadrant of left female breast; Breast cancer metastasized to multiple sites; Drug induced neutropenia(288.03); Back spasm; Heart burn; Anemia in neoplastic disease; Nausea with vomiting; Mucositis due to chemotherapy; Anorexia; Neoplastic malignant related fatigue; Dehydration; Diarrhea; Palmar plantar erythrodysesthesia; SOB (shortness of breath); Hypoxia; Pleural effusion; Leukocytosis; Prolonged QT interval; Dyspnea on exertion; Recurrent pleural effusion on left; Pleural effusion, left; Thrombocytopenia; and Cellulitis on her problem list.    is allergic to afinitor.    Medication List         This list is accurate as of: 08/04/15  3:42 PM.  Always use your most recent med list.               acidophilus Caps capsule  Take 1 capsule by mouth every morning.     Biotin 5000 MCG Caps  Take 5,000 mcg by mouth every morning.     cephALEXin 500 MG capsule  Commonly known as:  KEFLEX  Take 1 capsule (500 mg total) by mouth 4 (four) times daily.     cetirizine 10 MG tablet  Commonly known as:  ZYRTEC  Take 10 mg by mouth at bedtime.     Cholecalciferol 2000 UNITS Chew  Chew 1 tablet by mouth daily with breakfast.     escitalopram 20 MG tablet  Commonly known as:  LEXAPRO  Take 1 tablet (20 mg total) by mouth daily.     filgrastim 300 MCG/0.5ML Sosy injection  Commonly known as:  NEUPOGEN  Inject 0.5 mLs (300 mcg total) into the skin once.     Ibuprofen 200 MG Caps  Commonly known as:  ADVIL  Take 2 capsules (400 mg total) by mouth 3 (three) times daily with meals as needed.     LORazepam 0.5 MG tablet  Commonly known as:  ATIVAN  Take 1 tablet (0.5 mg total) by mouth every 6 (six) hours as needed for anxiety.     Melatonin 10 MG Tabs  Take 1 tablet by mouth at bedtime.     montelukast 10 MG tablet  Commonly known  as:  SINGULAIR  Take 10 mg by mouth daily with breakfast.     multivitamin tablet  Take 1 tablet by mouth every morning.     nystatin 100000 UNIT/ML suspension  Commonly known as:  MYCOSTATIN  Take 5 mLs (500,000 Units total) by mouth 4 (four) times daily.     omeprazole 40 MG capsule  Commonly known as:  PRILOSEC  Take 40 mg by mouth every morning.     ondansetron 8 MG tablet  Commonly known as:  ZOFRAN  Take 8 mg by mouth 2 (two) times daily. Starts day after chemo     prochlorperazine 10 MG tablet  Commonly known as:  COMPAZINE  Take 10 mg by mouth every 6 (six) hours as needed for nausea or vomiting.     traMADol 50 MG tablet  Commonly known as:  ULTRAM  Take 1 tablet (50 mg total) by mouth every 6 (six) hours as needed.          PHYSICAL EXAMINATION  Oncology Vitals 08/04/2015 08/04/2015 07/31/2015 07/30/2015 07/29/2015 07/28/2015 07/20/2015  Height - - - - - 160 cm -  Weight - - - - - 79.606 kg -  Weight (lbs) - - - - - 175 lbs 8 oz -  BMI (kg/m2) - - - - - 31.09 kg/m2 -  Temp 98.5 99.4 99.3 99.1 98.9 98.4 98  Pulse 96 105 102 104 112 110 87  Resp 16 18 - - - 18 18  Resp (Historical as of 07/12/12) - - - - - - -  SpO2 95 100 - - - 95 100  BSA (m2) - - - - - 1.88 m2 -   BP Readings from Last 3 Encounters:  08/04/15 128/80  07/31/15 117/74  07/30/15 125/72    Physical Exam  Constitutional: She is oriented to person, place, and time and well-developed, well-nourished, and in no distress.  HENT:  Head: Normocephalic and atraumatic.  Eyes: Conjunctivae and EOM are normal. Pupils are equal, round, and reactive to light. Right eye exhibits no discharge. Left eye exhibits no discharge. No scleral icterus.  Neck: Normal range of motion.  Pulmonary/Chest: Effort normal. No respiratory distress.  Musculoskeletal: Normal range of motion. She exhibits edema and tenderness.  See skin note.   Neurological: She is alert and oriented to person, place, and time. Gait normal.  Skin: Skin is warm and dry. No rash noted. There is erythema. No pallor.  On exam.  It does appear the patient has an approximately 2 cm in diameter hematoma to her left anterior shin area.  There is some erythema with surrounding mild edema and warmth to the site.  There are no red streaks.    Psychiatric: Affect normal.  Nursing note and vitals reviewed.   LABORATORY DATA:. Appointment on 08/04/2015  Component Date Value Ref Range Status  . WBC 08/04/2015 4.9  3.9 - 10.3 10e3/uL Final  . NEUT# 08/04/2015 3.3  1.5 - 6.5 10e3/uL Final  . HGB 08/04/2015 9.3* 11.6 - 15.9 g/dL Final  . HCT 08/04/2015 28.8* 34.8 - 46.6 % Final  . Platelets 08/04/2015 138* 145 - 400 10e3/uL Final  . MCV 08/04/2015 90.4  79.5 - 101.0 fL Final  . MCH 08/04/2015 29.0   25.1 - 34.0 pg Final  . MCHC 08/04/2015 32.1  31.5 - 36.0 g/dL Final  . RBC 08/04/2015 3.19* 3.70 - 5.45 10e6/uL Final  . RDW 08/04/2015 18.8* 11.2 - 14.5 % Final  . lymph# 08/04/2015 0.8* 0.9 -  3.3 10e3/uL Final  . MONO# 08/04/2015 0.7  0.1 - 0.9 10e3/uL Final  . Eosinophils Absolute 08/04/2015 0.0  0.0 - 0.5 10e3/uL Final  . Basophils Absolute 08/04/2015 0.0  0.0 - 0.1 10e3/uL Final  . NEUT% 08/04/2015 66.7  38.4 - 76.8 % Final  . LYMPH% 08/04/2015 17.0  14.0 - 49.7 % Final  . MONO% 08/04/2015 14.8* 0.0 - 14.0 % Final  . EOS% 08/04/2015 0.5  0.0 - 7.0 % Final  . BASO% 08/04/2015 1.0  0.0 - 2.0 % Final  . Sodium 08/04/2015 142  136 - 145 mEq/L Final  . Potassium 08/04/2015 4.5  3.5 - 5.1 mEq/L Final  . Chloride 08/04/2015 106  98 - 109 mEq/L Final  . CO2 08/04/2015 28  22 - 29 mEq/L Final  . Glucose 08/04/2015 115  70 - 140 mg/dl Final  . BUN 08/04/2015 10.1  7.0 - 26.0 mg/dL Final  . Creatinine 08/04/2015 0.6  0.6 - 1.1 mg/dL Final  . Total Bilirubin 08/04/2015 0.42  0.20 - 1.20 mg/dL Final  . Alkaline Phosphatase 08/04/2015 172* 40 - 150 U/L Final  . AST 08/04/2015 37* 5 - 34 U/L Final  . ALT 08/04/2015 33  0 - 55 U/L Final  . Total Protein 08/04/2015 5.3* 6.4 - 8.3 g/dL Final  . Albumin 08/04/2015 2.6* 3.5 - 5.0 g/dL Final  . Calcium 08/04/2015 8.6  8.4 - 10.4 mg/dL Final  . Anion Gap 08/04/2015 8  3 - 11 mEq/L Final  . EGFR 08/04/2015 >90  >90 ml/min/1.73 m2 Final   eGFR is calculated using the CKD-EPI Creatinine Equation (2009)   Left leg:      RADIOGRAPHIC STUDIES: No results found.  ASSESSMENT/PLAN:    Cancer of lower-inner quadrant of left female breast Patient presents to the [facility] today to receive her next carboplatin/gemcitabine chemotherapy regimen.  She received her last Aranesp injection on 07/28/2015.  Blood counts obtained today are essentially stable.  Patient is scheduled to return on 08/10/2015 for labs and possible blood products.  Patient  is scheduled to return on 08/18/2015 for labs, visit, and her next cycle of chemotherapy.  Cellulitis Patient states that she bumped her left anterior leg a few days ago; and developed a bruise/hematoma to the site.  She reports that she applied a natural oil to the bruise/hematoma just just today; and now has erythema and mild warmth to the site as well.  She denies any recent fevers or chills.  On exam.  It does appear the patient has an approximately 2 cm in diameter hematoma to her left anterior shin area.  There is some erythema with surrounding mild edema and warmth to the site.  There are no red streaks.  The new onset erythema that developed within the past 24 hours could very well be associated with the application of the natural oils; but could also be the initial stages of some mild cellulitis.  Patient will be prescribed Keflex antibiotics for treatment of mild cellulitis.  Advised patient to let us know if site does not improve or worsens whatsoever.  Patient stated understanding of all instructions; and was in agreement with this plan of care. The patient knows to call the clinic with any problems, questions or concerns.   Review/collaboration with Dr.Magrinat regarding all aspects of patient's visit today.   Total time spent with patient was 25 minutes;  with greater than 75 percent of that time spent in face to face counseling regarding patient's symptoms,  and coordination of care and follow up.  Disclaimer: This note was dictated with voice recognition software. Similar sounding words can inadvertently be transcribed and may not be corrected upon review.   Drue Second, NP 08/04/2015

## 2015-08-04 NOTE — Assessment & Plan Note (Addendum)
Patient presents to the [facility] today to receive her next carboplatin/gemcitabine chemotherapy regimen.  She received her last Aranesp injection on 07/28/2015.  Blood counts obtained today are essentially stable.  Patient is scheduled to return on 08/10/2015 for labs and possible blood products.  Patient is scheduled to return on 08/18/2015 for labs, visit, and her next cycle of chemotherapy.

## 2015-08-04 NOTE — Progress Notes (Signed)
1140: pt reports reddened area on lower leg. Knot noted at red area. Pt states the knot has been there but that originally the area was bruised approx two weeks ago and then last night area turn red and became painful to walk on. Selena Lesser aware and to come to infusion to assess.

## 2015-08-04 NOTE — Patient Instructions (Signed)
[facility] Discharge Instructions for Patients Receiving Chemotherapy  Today you received the following chemotherapy agents: Gemzar and Carboplatin   To help prevent nausea and vomiting after your treatment, we encourage you to take your nausea medication as directed.    If you develop nausea and vomiting that is not controlled by your nausea medication, call the clinic.   BELOW ARE SYMPTOMS THAT SHOULD BE REPORTED IMMEDIATELY:  *FEVER GREATER THAN 100.5 F  *CHILLS WITH OR WITHOUT FEVER  NAUSEA AND VOMITING THAT IS NOT CONTROLLED WITH YOUR NAUSEA MEDICATION  *UNUSUAL SHORTNESS OF BREATH  *UNUSUAL BRUISING OR BLEEDING  TENDERNESS IN MOUTH AND THROAT WITH OR WITHOUT PRESENCE OF ULCERS  *URINARY PROBLEMS  *BOWEL PROBLEMS  UNUSUAL RASH Items with * indicate a potential emergency and should be followed up as soon as possible.  Feel free to call the clinic you have any questions or concerns. The clinic phone number is [phone].  Please show the [facility] at check-in to the Emergency Department and triage nurse.    Ferumoxytol injection What is this medicine? FERUMOXYTOL is an iron complex. Iron is used to make healthy red blood cells, which carry oxygen and nutrients throughout the body. This medicine is used to treat iron deficiency anemia in people with chronic kidney disease. This medicine may be used for other purposes; ask your health care provider or pharmacist if you have questions. COMMON BRAND NAME(S): Feraheme What should I tell my health care provider before I take this medicine? They need to know if you have any of these conditions: -anemia not caused by low iron levels -high levels of iron in the blood -magnetic resonance imaging (MRI) test scheduled -an unusual or allergic reaction to iron, other medicines, foods, dyes, or preservatives -pregnant or trying to get pregnant -breast-feeding How should I use this medicine? This  medicine is for injection into a vein. It is given by a health care professional in a hospital or clinic setting. Talk to your pediatrician regarding the use of this medicine in children. Special care may be needed. Overdosage: If you think you've taken too much of this medicine contact a poison control center or emergency room at once. Overdosage: If you think you have taken too much of this medicine contact a poison control center or emergency room at once. NOTE: This medicine is only for you. Do not share this medicine with others. What if I miss a dose? It is important not to miss your dose. Call your doctor or health care professional if you are unable to keep an appointment. What may interact with this medicine? This medicine may interact with the following medications: -other iron products This list may not describe all possible interactions. Give your health care provider a list of all the medicines, herbs, non-prescription drugs, or dietary supplements you use. Also tell them if you smoke, drink alcohol, or use illegal drugs. Some items may interact with your medicine. What should I watch for while using this medicine? Visit your doctor or healthcare professional regularly. Tell your doctor or healthcare professional if your symptoms do not start to get better or if they get worse. You may need blood work done while you are taking this medicine. You may need to follow a special diet. Talk to your doctor. Foods that contain iron include: whole grains/cereals, dried fruits, beans, or peas, leafy green vegetables, and organ meats (liver, kidney). What side effects may I notice from receiving this medicine? Side effects that  you should report to your doctor or health care professional as soon as possible: -allergic reactions like skin rash, itching or hives, swelling of the face, lips, or tongue -breathing problems -changes in blood pressure -feeling faint or lightheaded, falls -fever or  chills -flushing, sweating, or hot feelings -swelling of the ankles or feet Side effects that usually do not require medical attention (Report these to your doctor or health care professional if they continue or are bothersome.): -diarrhea -headache -nausea, vomiting -stomach pain This list may not describe all possible side effects. Call your doctor for medical advice about side effects. You may report side effects to FDA at 1-800-FDA-1088. Where should I keep my medicine? This drug is given in a hospital or clinic and will not be stored at home. NOTE: This sheet is a summary. It may not cover all possible information. If you have questions about this medicine, talk to your doctor, pharmacist, or health care provider.  2015, Elsevier/Gold Standard. (2012-07-13 15:23:36)  Pegfilgrastim injection What is this medicine? PEGFILGRASTIM (peg fil GRA stim) is a long-acting granulocyte colony-stimulating factor that stimulates the growth of neutrophils, a type of white blood cell important in the body's fight against infection. It is used to reduce the incidence of fever and infection in patients with certain types of cancer who are receiving chemotherapy that affects the bone marrow. This medicine may be used for other purposes; ask your health care provider or pharmacist if you have questions. COMMON BRAND NAME(S): Neulasta What should I tell my health care provider before I take this medicine? They need to know if you have any of these conditions: -latex allergy -ongoing radiation therapy -sickle cell disease -skin reactions to acrylic adhesives (On-Body Injector only) -an unusual or allergic reaction to pegfilgrastim, filgrastim, other medicines, foods, dyes, or preservatives -pregnant or trying to get pregnant -breast-feeding How should I use this medicine? This medicine is for injection under the skin. If you get this medicine at home, you will be taught how to prepare and give the  pre-filled syringe or how to use the On-body Injector. Refer to the patient Instructions for Use for detailed instructions. Use exactly as directed. Take your medicine at regular intervals. Do not take your medicine more often than directed. It is important that you put your used needles and syringes in a special sharps container. Do not put them in a trash can. If you do not have a sharps container, call your pharmacist or healthcare provider to get one. Talk to your pediatrician regarding the use of this medicine in children. Special care may be needed. Overdosage: If you think you have taken too much of this medicine contact a poison control center or emergency room at once. NOTE: This medicine is only for you. Do not share this medicine with others. What if I miss a dose? It is important not to miss your dose. Call your doctor or health care professional if you miss your dose. If you miss a dose due to an On-body Injector failure or leakage, a new dose should be administered as soon as possible using a single prefilled syringe for manual use. What may interact with this medicine? Interactions have not been studied. Give your health care provider a list of all the medicines, herbs, non-prescription drugs, or dietary supplements you use. Also tell them if you smoke, drink alcohol, or use illegal drugs. Some items may interact with your medicine. This list may not describe all possible interactions. Give your health care provider  a list of all the medicines, herbs, non-prescription drugs, or dietary supplements you use. Also tell them if you smoke, drink alcohol, or use illegal drugs. Some items may interact with your medicine. What should I watch for while using this medicine? You may need blood work done while you are taking this medicine. If you are going to need a MRI, CT scan, or other procedure, tell your doctor that you are using this medicine (On-Body Injector only). What side effects may I  notice from receiving this medicine? Side effects that you should report to your doctor or health care professional as soon as possible: -allergic reactions like skin rash, itching or hives, swelling of the face, lips, or tongue -dizziness -fever -pain, redness, or irritation at site where injected -pinpoint red spots on the skin -shortness of breath or breathing problems -stomach or side pain, or pain at the shoulder -swelling -tiredness -trouble passing urine Side effects that usually do not require medical attention (report to your doctor or health care professional if they continue or are bothersome): -bone pain -muscle pain This list may not describe all possible side effects. Call your doctor for medical advice about side effects. You may report side effects to FDA at 1-800-FDA-1088. Where should I keep my medicine? Keep out of the reach of children. Store pre-filled syringes in a refrigerator between 2 and 8 degrees C (36 and 46 degrees F). Do not freeze. Keep in carton to protect from light. Throw away this medicine if it is left out of the refrigerator for more than 48 hours. Throw away any unused medicine after the expiration date. NOTE: This sheet is a summary. It may not cover all possible information. If you have questions about this medicine, talk to your doctor, pharmacist, or health care provider.  2015, Elsevier/Gold Standard. (2014-02-27 16:14:05)

## 2015-08-05 ENCOUNTER — Other Ambulatory Visit: Payer: Self-pay | Admitting: *Deleted

## 2015-08-06 ENCOUNTER — Telehealth: Payer: Self-pay | Admitting: *Deleted

## 2015-08-06 NOTE — Telephone Encounter (Signed)
LM for rtn call- checking on status of pt rash/brusies on her leg and making sure she is tolerating abx.

## 2015-08-10 ENCOUNTER — Other Ambulatory Visit: Payer: Self-pay | Admitting: *Deleted

## 2015-08-10 ENCOUNTER — Other Ambulatory Visit (HOSPITAL_BASED_OUTPATIENT_CLINIC_OR_DEPARTMENT_OTHER): Payer: 59

## 2015-08-10 ENCOUNTER — Other Ambulatory Visit: Payer: Self-pay | Admitting: Nurse Practitioner

## 2015-08-10 ENCOUNTER — Ambulatory Visit (HOSPITAL_BASED_OUTPATIENT_CLINIC_OR_DEPARTMENT_OTHER): Payer: 59

## 2015-08-10 VITALS — BP 136/82 | HR 95 | Temp 99.2°F | Resp 18

## 2015-08-10 DIAGNOSIS — C787 Secondary malignant neoplasm of liver and intrahepatic bile duct: Secondary | ICD-10-CM

## 2015-08-10 DIAGNOSIS — M7989 Other specified soft tissue disorders: Secondary | ICD-10-CM

## 2015-08-10 DIAGNOSIS — D630 Anemia in neoplastic disease: Secondary | ICD-10-CM

## 2015-08-10 DIAGNOSIS — M79605 Pain in left leg: Secondary | ICD-10-CM | POA: Diagnosis not present

## 2015-08-10 DIAGNOSIS — C50312 Malignant neoplasm of lower-inner quadrant of left female breast: Secondary | ICD-10-CM | POA: Diagnosis not present

## 2015-08-10 DIAGNOSIS — D61818 Other pancytopenia: Secondary | ICD-10-CM | POA: Diagnosis not present

## 2015-08-10 DIAGNOSIS — M79604 Pain in right leg: Secondary | ICD-10-CM | POA: Diagnosis not present

## 2015-08-10 DIAGNOSIS — D649 Anemia, unspecified: Secondary | ICD-10-CM

## 2015-08-10 DIAGNOSIS — C50919 Malignant neoplasm of unspecified site of unspecified female breast: Secondary | ICD-10-CM

## 2015-08-10 LAB — CBC WITH DIFFERENTIAL/PLATELET
BASO%: 0.6 % (ref 0.0–2.0)
Basophils Absolute: 0 10*3/uL (ref 0.0–0.1)
EOS%: 0.3 % (ref 0.0–7.0)
Eosinophils Absolute: 0 10*3/uL (ref 0.0–0.5)
HCT: 21.7 % — ABNORMAL LOW (ref 34.8–46.6)
HEMOGLOBIN: 7 g/dL — AB (ref 11.6–15.9)
LYMPH%: 6.3 % — ABNORMAL LOW (ref 14.0–49.7)
MCH: 29.5 pg (ref 25.1–34.0)
MCHC: 32.1 g/dL (ref 31.5–36.0)
MCV: 91.9 fL (ref 79.5–101.0)
MONO#: 0 10*3/uL — ABNORMAL LOW (ref 0.1–0.9)
MONO%: 0.3 % (ref 0.0–14.0)
NEUT%: 92.5 % — ABNORMAL HIGH (ref 38.4–76.8)
NEUTROS ABS: 7.3 10*3/uL — AB (ref 1.5–6.5)
Platelets: 6 10*3/uL — CL (ref 145–400)
RBC: 2.37 10*6/uL — ABNORMAL LOW (ref 3.70–5.45)
RDW: 18.7 % — AB (ref 11.2–14.5)
WBC: 7.8 10*3/uL (ref 3.9–10.3)
lymph#: 0.5 10*3/uL — ABNORMAL LOW (ref 0.9–3.3)

## 2015-08-10 LAB — COMPREHENSIVE METABOLIC PANEL (CC13)
ALBUMIN: 2.3 g/dL — AB (ref 3.5–5.0)
ALK PHOS: 179 U/L — AB (ref 40–150)
ALT: 48 U/L (ref 0–55)
AST: 41 U/L — AB (ref 5–34)
Anion Gap: 6 mEq/L (ref 3–11)
BILIRUBIN TOTAL: 1.22 mg/dL — AB (ref 0.20–1.20)
BUN: 20.7 mg/dL (ref 7.0–26.0)
CO2: 28 mEq/L (ref 22–29)
Calcium: 8.3 mg/dL — ABNORMAL LOW (ref 8.4–10.4)
Chloride: 106 mEq/L (ref 98–109)
Creatinine: 0.6 mg/dL (ref 0.6–1.1)
EGFR: >90 mL/min/{1.73_m2} (ref >90)
GLUCOSE: 116 mg/dL (ref 70–140)
Potassium: 4 mEq/L (ref 3.5–5.1)
SODIUM: 140 meq/L (ref 136–145)
TOTAL PROTEIN: 4.7 g/dL — AB (ref 6.4–8.3)

## 2015-08-10 LAB — PREPARE RBC (CROSSMATCH)

## 2015-08-10 LAB — HOLD TUBE, BLOOD BANK

## 2015-08-10 MED ORDER — SODIUM CHLORIDE 0.9 % IJ SOLN
10.0000 mL | INTRAMUSCULAR | Status: AC | PRN
  Administered 2015-08-10: 10 mL
  Filled 2015-08-10: qty 10

## 2015-08-10 MED ORDER — HYDROMORPHONE HCL 4 MG/ML IJ SOLN
2.0000 mg | Freq: Once | INTRAMUSCULAR | Status: AC
  Administered 2015-08-10: 2 mg via INTRAVENOUS

## 2015-08-10 MED ORDER — HEPARIN SOD (PORK) LOCK FLUSH 100 UNIT/ML IV SOLN
500.0000 [IU] | Freq: Every day | INTRAVENOUS | Status: AC | PRN
  Administered 2015-08-10: 500 [IU]
  Filled 2015-08-10: qty 5

## 2015-08-10 MED ORDER — HEPARIN SOD (PORK) LOCK FLUSH 100 UNIT/ML IV SOLN
250.0000 [IU] | INTRAVENOUS | Status: DC | PRN
  Filled 2015-08-10: qty 5

## 2015-08-10 MED ORDER — HYDROMORPHONE HCL 4 MG/ML IJ SOLN
INTRAMUSCULAR | Status: AC
  Filled 2015-08-10: qty 1

## 2015-08-10 MED ORDER — SODIUM CHLORIDE 0.9 % IJ SOLN
3.0000 mL | INTRAMUSCULAR | Status: DC | PRN
  Filled 2015-08-10: qty 10

## 2015-08-10 MED ORDER — ACETAMINOPHEN 325 MG PO TABS
ORAL_TABLET | ORAL | Status: AC
  Filled 2015-08-10: qty 2

## 2015-08-10 MED ORDER — FUROSEMIDE 10 MG/ML IJ SOLN
10.0000 mg | Freq: Once | INTRAMUSCULAR | Status: AC
Start: 2015-08-10 — End: 2015-08-10
  Administered 2015-08-10: 10 mg via INTRAVENOUS

## 2015-08-10 MED ORDER — SODIUM CHLORIDE 0.9 % IV SOLN
250.0000 mL | Freq: Once | INTRAVENOUS | Status: AC
  Administered 2015-08-10: 250 mL via INTRAVENOUS

## 2015-08-10 MED ORDER — DIPHENHYDRAMINE HCL 25 MG PO CAPS
25.0000 mg | ORAL_CAPSULE | Freq: Once | ORAL | Status: AC
  Administered 2015-08-10: 25 mg via ORAL

## 2015-08-10 MED ORDER — FUROSEMIDE 10 MG/ML IJ SOLN
INTRAMUSCULAR | Status: AC
Start: 2015-08-10 — End: 2015-08-10
  Filled 2015-08-10: qty 2

## 2015-08-10 MED ORDER — DIPHENHYDRAMINE HCL 25 MG PO CAPS
ORAL_CAPSULE | ORAL | Status: AC
  Filled 2015-08-10: qty 1

## 2015-08-10 MED ORDER — ACETAMINOPHEN 325 MG PO TABS
650.0000 mg | ORAL_TABLET | Freq: Once | ORAL | Status: AC
  Administered 2015-08-10: 650 mg via ORAL

## 2015-08-10 NOTE — Progress Notes (Signed)
Pt co/ of pain and swelling to legs bilat. Call to MD, VO for Lasix 10mg , Dilaudid 2mg . Discussed pt has small quarter size red area warm to touch to left lower shin. Pt currently on Keflex began 08/04/15. Pt completed 1 unit platelet, 1 unit of PRBC. Temp increase to 99.1 at completion of blood unit. Selena Lesser, NP notified, will see pt prior to d/c

## 2015-08-10 NOTE — Patient Instructions (Signed)

## 2015-08-11 ENCOUNTER — Other Ambulatory Visit: Payer: Self-pay | Admitting: Nurse Practitioner

## 2015-08-11 ENCOUNTER — Ambulatory Visit (HOSPITAL_BASED_OUTPATIENT_CLINIC_OR_DEPARTMENT_OTHER): Payer: 59 | Admitting: Nurse Practitioner

## 2015-08-11 ENCOUNTER — Other Ambulatory Visit: Payer: Self-pay | Admitting: *Deleted

## 2015-08-11 ENCOUNTER — Ambulatory Visit (HOSPITAL_BASED_OUTPATIENT_CLINIC_OR_DEPARTMENT_OTHER): Payer: 59

## 2015-08-11 ENCOUNTER — Ambulatory Visit (HOSPITAL_COMMUNITY)
Admission: RE | Admit: 2015-08-11 | Discharge: 2015-08-11 | Disposition: A | Discharge status: Home / Self Care | Payer: 59 | Source: Ambulatory Visit | Attending: Nurse Practitioner | Admitting: Nurse Practitioner

## 2015-08-11 VITALS — BP 126/76 | HR 101 | Temp 98.7°F | Resp 18

## 2015-08-11 DIAGNOSIS — C50312 Malignant neoplasm of lower-inner quadrant of left female breast: Secondary | ICD-10-CM

## 2015-08-11 DIAGNOSIS — I82812 Embolism and thrombosis of superficial veins of left lower extremities: Secondary | ICD-10-CM | POA: Diagnosis not present

## 2015-08-11 DIAGNOSIS — M25562 Pain in left knee: Secondary | ICD-10-CM | POA: Diagnosis not present

## 2015-08-11 DIAGNOSIS — M7989 Other specified soft tissue disorders: Secondary | ICD-10-CM | POA: Diagnosis not present

## 2015-08-11 DIAGNOSIS — D649 Anemia, unspecified: Secondary | ICD-10-CM | POA: Diagnosis not present

## 2015-08-11 DIAGNOSIS — D61818 Other pancytopenia: Secondary | ICD-10-CM | POA: Diagnosis not present

## 2015-08-11 DIAGNOSIS — C50919 Malignant neoplasm of unspecified site of unspecified female breast: Secondary | ICD-10-CM

## 2015-08-11 DIAGNOSIS — R530 Neoplastic (malignant) related fatigue: Secondary | ICD-10-CM

## 2015-08-11 LAB — PREPARE PLATELET PHERESIS: Unit division: 0

## 2015-08-11 MED ORDER — ACETAMINOPHEN 325 MG PO TABS
ORAL_TABLET | ORAL | Status: AC
  Filled 2015-08-11: qty 2

## 2015-08-11 MED ORDER — HEPARIN SOD (PORK) LOCK FLUSH 100 UNIT/ML IV SOLN
500.0000 [IU] | Freq: Every day | INTRAVENOUS | Status: AC | PRN
  Administered 2015-08-11: 500 [IU]
  Filled 2015-08-11: qty 5

## 2015-08-11 MED ORDER — DIPHENHYDRAMINE HCL 25 MG PO CAPS
25.0000 mg | ORAL_CAPSULE | Freq: Once | ORAL | Status: AC
  Administered 2015-08-11: 25 mg via ORAL

## 2015-08-11 MED ORDER — DIPHENHYDRAMINE HCL 25 MG PO CAPS
ORAL_CAPSULE | ORAL | Status: AC
  Filled 2015-08-11: qty 1

## 2015-08-11 MED ORDER — ACETAMINOPHEN 325 MG PO TABS
650.0000 mg | ORAL_TABLET | Freq: Once | ORAL | Status: AC
  Administered 2015-08-11: 650 mg via ORAL

## 2015-08-11 MED ORDER — SODIUM CHLORIDE 0.9 % IV SOLN
250.0000 mL | Freq: Once | INTRAVENOUS | Status: AC
  Administered 2015-08-11: 250 mL via INTRAVENOUS

## 2015-08-11 MED ORDER — SODIUM CHLORIDE 0.9 % IJ SOLN
10.0000 mL | INTRAMUSCULAR | Status: DC | PRN
  Filled 2015-08-11: qty 10

## 2015-08-11 NOTE — Progress Notes (Signed)
Patient has been scheduled for a doppler for her left leg at noon today. Pt is aware of the appt.

## 2015-08-11 NOTE — Addendum Note (Signed)
Addended by: Adalberto Cole on: 08/11/2015 08:34 AM   Modules accepted: Orders

## 2015-08-11 NOTE — Patient Instructions (Signed)

## 2015-08-11 NOTE — Progress Notes (Signed)
*  Preliminary Results* Left lower extremity venous duplex completed. Left lower extremity is negative for deep vein thrombosis. There is evidence of superficial vein thrombosis involving the lesser saphenous vein at its origin. 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 There is no evidence of left Baker's cyst.   Preliminary discussed with Retta Mac, NP.   08/11/2015 12:31 PM  Maudry Mayhew, RVT, RDCS, RDMS

## 2015-08-12 ENCOUNTER — Other Ambulatory Visit: Payer: Self-pay | Admitting: Oncology

## 2015-08-12 ENCOUNTER — Encounter: Payer: Self-pay | Admitting: Nurse Practitioner

## 2015-08-12 DIAGNOSIS — I82812 Embolism and thrombosis of superficial veins of left lower extremities: Secondary | ICD-10-CM | POA: Insufficient documentation

## 2015-08-12 LAB — TYPE AND SCREEN
ABO/RH(D): A POS
ANTIBODY SCREEN: NEGATIVE
Unit division: 0
Unit division: 0

## 2015-08-12 NOTE — Assessment & Plan Note (Signed)
Patient states that she bumped her left anterior leg a few days ago; and developed a bruise/hematoma to the site.  She reports that she applied a natural oil to the bruise/hematoma just just today; and now has erythema and mild warmth to the site as well.  She denies any recent fevers or chills.  On exam it does appear the patient has an approximately 2 cm in diameter hematoma to her left anterior shin area.  There is some trace petechiae to the site; but no surrounding edema, warmth, or red streaks.  There is also no tenderness to the site with palpation.   Patient was initiated on Keflex antibiotics several days ago for questionable cellulitis.  However, the antibiotics did not improve the lesion.  Doppler ultrasound obtained today revealed:  Left lower extremity venous duplex completed. Left lower extremity is negative for deep vein thrombosis. There is evidence of superficial vein thrombosis involving the lesser saphenous vein at its origin. 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 There is no evidence of left Baker's cyst.   Patient was advised that she could stop taking the Keflex antibiotics.  She was advised to continue elevating her leg above the level of for heart whenever possible.  She was also advised to use moist warm compresses to the site.  On occasion as well.  Advised patient that the superficial thrombosis and thrombosis of the small varicose vein to the left distal calf region will eventually resolve.  Advised patient to call/return or go directed to the emergency department for any worsening symptoms whatsoever.

## 2015-08-12 NOTE — Progress Notes (Signed)
SYMPTOM MANAGEMENT CLINIC   HPI: Kristin Hoffman 49 y.o. female diagnosed with breast cancer.  Currently undergoing carboplatin/gemcitabine/Aranesp chemotherapy regimen.   Patient states that she bumped her left anterior leg a few days ago; and developed a bruise/hematoma to the site.  She reports that she applied a natural oil to the bruise/hematoma just just today; and now has erythema and mild warmth to the site as well.  She denies any recent fevers or chills.  On exam it does appear the patient has an approximately 2 cm in diameter hematoma to her left anterior shin area.  There is some trace petechiae to the site; but no surrounding edema, warmth, or red streaks.  There is also no tenderness to the site with palpation.   Patient was initiated on Keflex antibiotics several days ago for questionable cellulitis.  However, the antibiotics did not improve the lesion.  Doppler ultrasound obtained today revealed:  Left lower extremity venous duplex completed. Left lower extremity is negative for deep vein thrombosis. There is evidence of superficial vein thrombosis involving the lesser saphenous vein at its origin. 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 There is no evidence of left Baker's cyst.   Patient was advised that she could stop taking the Keflex antibiotics.  She was advised to continue elevating her leg above the level of for heart whenever possible.  She was also advised to use moist warm compresses to the site.  On occasion as well.  Advised patient that the superficial thrombosis and thrombosis of the small varicose vein to the left distal calf region will eventually resolve.  Advised patient to call/return or go directed to the emergency department for any worsening symptoms whatsoever.        Patient was initiated on Keflex antibiotics several days ago for questionable cellulitis.  However, the antibiotics did not improve  the lesion.  Doppler ultrasound obtained today revealed:  Left lower extremity venous duplex completed. Left lower extremity is negative for deep vein thrombosis. There is evidence of superficial vein thrombosis involving the lesser saphenous vein at its origin. 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 There is no evidence of left Baker's cyst.   Patient was advised that she could stop taking the Keflex antibiotics.  She was advised to continue elevating her leg above the level of for heart whenever possible.  She was also advised to use moist warm compresses to the site.  On occasion as well.  Advised patient that the superficial thrombosis and thrombosis of the small varicose vein to the left distal calf region will eventually resolve.  Advised patient to call/return or go directed to the emergency department for any worsening symptoms whatsoever.  HPI  ROS  Past Medical History  Diagnosis Date  . Depression   . Reflux   . Hx-TIA (transient ischemic attack) 11/22/2013  . Chronic headaches 11/22/2013  . Anxiety 11/22/2013  . Breast cancer   . Cancer   . Breast cancer metastasized to multiple sites 12/24/2013    Past Surgical History  Procedure Laterality Date  . Mastectomy w/ nodes partial    . Mastectomy    . Breast reconstruction    . Portacath placement      x 2  . Portacath removal      x 2  . Abdominal hysterectomy    . Tee without cardioversion  11/12/2012    Procedure: TRANSESOPHAGEAL  ECHOCARDIOGRAM (TEE);  Surgeon: Candee Furbish, MD;  Location: [facility] ENDOSCOPY;  Service: Cardiovascular;  Laterality: N/A;  This TEE may be Dr. Marlou Porch or a LHC Dr    has Hx-TIA (transient ischemic attack); Chronic headaches; Anxiety; Cancer of lower-inner quadrant of left female breast; Breast cancer metastasized to multiple sites; Drug induced neutropenia(288.03); Back spasm; Heart burn; Anemia in neoplastic disease; Nausea with vomiting;  Mucositis due to chemotherapy; Anorexia; Neoplastic malignant related fatigue; Dehydration; Diarrhea; Palmar plantar erythrodysesthesia; SOB (shortness of breath); Hypoxia; Pleural effusion; Leukocytosis; Prolonged QT interval; Dyspnea on exertion; Recurrent pleural effusion on left; Pleural effusion, left; Thrombocytopenia; Cellulitis; and Superficial thrombosis of left lower extremity on her problem list.    is allergic to afinitor.    Medication List       This list is accurate as of: 08/11/15 11:59 PM.  Always use your most recent med list.               acidophilus Caps capsule  Take 1 capsule by mouth every morning.     Biotin 5000 MCG Caps  Take 5,000 mcg by mouth every morning.     cephALEXin 500 MG capsule  Commonly known as:  KEFLEX  Take 1 capsule (500 mg total) by mouth 4 (four) times daily.     cetirizine 10 MG tablet  Commonly known as:  ZYRTEC  Take 10 mg by mouth at bedtime.     Cholecalciferol 2000 UNITS Chew  Chew 1 tablet by mouth daily with breakfast.     escitalopram 20 MG tablet  Commonly known as:  LEXAPRO  Take 1 tablet (20 mg total) by mouth daily.     filgrastim 300 MCG/0.5ML Sosy injection  Commonly known as:  NEUPOGEN  Inject 0.5 mLs (300 mcg total) into the skin once.     Ibuprofen 200 MG Caps  Commonly known as:  ADVIL  Take 2 capsules (400 mg total) by mouth 3 (three) times daily with meals as needed.     LORazepam 0.5 MG tablet  Commonly known as:  ATIVAN  Take 1 tablet (0.5 mg total) by mouth every 6 (six) hours as needed for anxiety.     Melatonin 10 MG Tabs  Take 1 tablet by mouth at bedtime.     montelukast 10 MG tablet  Commonly known as:  SINGULAIR  Take 10 mg by mouth daily with breakfast.     multivitamin tablet  Take 1 tablet by mouth every morning.     nystatin 100000 UNIT/ML suspension  Commonly known as:  MYCOSTATIN  Take 5 mLs (500,000 Units total) by mouth 4 (four) times daily.     omeprazole 40 MG capsule    Commonly known as:  PRILOSEC  Take 40 mg by mouth every morning.     ondansetron 8 MG tablet  Commonly known as:  ZOFRAN  Take 8 mg by mouth 2 (two) times daily. Starts day after chemo     prochlorperazine 10 MG tablet  Commonly known as:  COMPAZINE  Take 10 mg by mouth every 6 (six) hours as needed for nausea or vomiting.     traMADol 50 MG tablet  Commonly known as:  ULTRAM  Take 1 tablet (50 mg total) by mouth every 6 (six) hours as needed.         PHYSICAL EXAMINATION  Oncology Vitals 08/11/2015 08/11/2015 08/11/2015 08/10/2015 08/10/2015 08/10/2015 08/10/2015  Height - - - - - - -  Weight - - - - - - -  Weight (lbs) - - - - - - -  BMI (kg/m2) - - - - - - -  Temp 98.7 98.6 98.2 99.2 98.3 98.4 99.1  Pulse 101 104 104 95 102 105 109  Resp _0 Resp (Historical as of 07/12/12) - - - - - - -  SpO2 100 100 100 100 - - 100  BSA (m2) - - - - - - -   BP Readings from Last 3 Encounters:  08/11/15 126/76  08/10/15 136/82  08/04/15 128/80    Physical Exam  Constitutional: She is oriented to person, place, and time and well-developed, well-nourished, and in no distress.  HENT:  Head: Normocephalic and atraumatic.  Mouth/Throat: Oropharynx is clear and moist.  Eyes: Conjunctivae and EOM are normal. Pupils are equal, round, and reactive to light. Right eye exhibits no discharge. Left eye exhibits no discharge. No scleral icterus.  Neck: Normal range of motion.  Pulmonary/Chest: Effort normal. No respiratory distress.  Musculoskeletal: Normal range of motion. She exhibits no edema or tenderness.  Neurological: She is alert and oriented to person, place, and time.  Skin: Skin is warm and dry. No rash noted. No erythema. No pallor.  On exam it does appear the patient has an approximately 2 cm in diameter hematoma to her left anterior shin area.  There is some trace petechiae to the site; but no surrounding edema, warmth, or red streaks.  There is also no tenderness to  the site with palpation.  Psychiatric: Affect normal.  Nursing note and vitals reviewed.   LABORATORY DATA:. Infusion on 08/10/2015  Component Date Value Ref Range Status  . Order Confirmation 08/10/2015 ORDER PROCESSED BY BLOOD BANK   Final  . Hold Tube, Blood Bank 08/10/2015 Type and Crossmatch Added   Final  Orders Only on 08/10/2015  Component Date Value Ref Range Status  . Unit Number 08/10/2015 [id number]   Final  . Blood Component Type 08/10/2015 PLTPHER LR1   Final  . Unit division 08/10/2015 00   Final  . Status of Unit 08/10/2015 ISSUED,FINAL   Final  . Transfusion Status 08/10/2015 OK TO TRANSFUSE   Final  Appointment on 08/10/2015  Component Date Value Ref Range Status  . WBC 08/10/2015 7.8  3.9 - 10.3 10e3/uL Final  . NEUT# 08/10/2015 7.3* 1.5 - 6.5 10e3/uL Final  . HGB 08/10/2015 7.0* 11.6 - 15.9 g/dL Final  . HCT 08/10/2015 21.7* 34.8 - 46.6 % Final  . Platelets 08/10/2015 6* 145 - 400 10e3/uL Final  . MCV 08/10/2015 91.9  79.5 - 101.0 fL Final  . MCH 08/10/2015 29.5  25.1 - 34.0 pg Final  . MCHC 08/10/2015 32.1  31.5 - 36.0 g/dL Final  . RBC 08/10/2015 2.37* 3.70 - 5.45 10e6/uL Final  . RDW 08/10/2015 18.7* 11.2 - 14.5 % Final  . lymph# 08/10/2015 0.5* 0.9 - 3.3 10e3/uL Final  . MONO# 08/10/2015 0.0* 0.1 - 0.9 10e3/uL Final  . Eosinophils Absolute 08/10/2015 0.0  0.0 - 0.5 10e3/uL Final  . Basophils Absolute 08/10/2015 0.0  0.0 - 0.1 10e3/uL Final  . NEUT% 08/10/2015 92.5* 38.4 - 76.8 % Final  . LYMPH% 08/10/2015 6.3* 14.0 - 49.7 % Final  . MONO% 08/10/2015 0.3  0.0 - 14.0 % Final  . EOS% 08/10/2015 0.3  0.0 - 7.0 % Final  . BASO% 08/10/2015 0.6  0.0 - 2.0 % Final  . Sodium 08/10/2015 140  136 - 145 mEq/L Final  . Potassium  08/10/2015 4.0  3.5 - 5.1 mEq/L Final  . Chloride 08/10/2015 106  98 - 109 mEq/L Final  . CO2 08/10/2015 28  22 - 29 mEq/L Final  . Glucose 08/10/2015 116  70 - 140 mg/dl Final  . BUN 08/10/2015 20.7  7.0 - 26.0 mg/dL Final  .  Creatinine 08/10/2015 0.6  0.6 - 1.1 mg/dL Final  . Total Bilirubin 08/10/2015 1.22* 0.20 - 1.20 mg/dL Final  . Alkaline Phosphatase 08/10/2015 179* 40 - 150 U/L Final  . AST 08/10/2015 41* 5 - 34 U/L Final  . ALT 08/10/2015 48  0 - 55 U/L Final  . Total Protein 08/10/2015 4.7* 6.4 - 8.3 g/dL Final  . Albumin 08/10/2015 2.3* 3.5 - 5.0 g/dL Final  . Calcium 08/10/2015 8.3* 8.4 - 10.4 mg/dL Final  . Anion Gap 08/10/2015 6  3 - 11 mEq/L Final  . EGFR 08/10/2015 >90  >90 ml/min/1.73 m2 Final   eGFR is calculated using the CKD-EPI Creatinine Equation (2009)   Doppler US: *Preliminary Results* Left lower extremity venous duplex completed. Left lower extremity is negative for deep vein thrombosis. There is evidence of superficial vein thrombosis involving the lesser saphenous vein at its origin. 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 There is no evidence of left Baker's cyst.    RADIOGRAPHIC STUDIES: No results found.  ASSESSMENT/PLAN:    Cancer of lower-inner quadrant of left female breast Patient received cycle 4, day 8 of her carboplatin/gemcitabine chemotherapy on 08/04/2015.  She received her last Aranesp injection on 07/28/2015.  Most recent labs revealed a WBC of 7.8, ANC 7.3, hemoglobin 7.0, and platelet count of 6.  Patient received a platelet transfusion on Monday, 08/10/2015.  She received a blood transfusion    today.  Patient is scheduled to return on 08/18/2015 for labs, physical, and her next cycle of chemotherapy.    Superficial thrombosis of left lower extremity Patient states that she bumped her left anterior leg a few days ago; and developed a bruise/hematoma to the site.  She reports that she applied a natural oil to the bruise/hematoma just just today; and now has erythema and mild warmth to the site as well.  She denies any recent fevers or chills.  On exam it does appear the patient has an approximately 2 cm in  diameter hematoma to her left anterior shin area.  There is some trace petechiae to the site; but no surrounding edema, warmth, or red streaks.  There is also no tenderness to the site with palpation.   Patient was initiated on Keflex antibiotics several days ago for questionable cellulitis.  However, the antibiotics did not improve the lesion.  Doppler ultrasound obtained today revealed:  Left lower extremity venous duplex completed. Left lower extremity is negative for deep vein thrombosis. There is evidence of superficial vein thrombosis involving the lesser saphenous vein at its origin. 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 There is no evidence of left Baker's cyst.   Patient was advised that she could stop taking the Keflex antibiotics.  She was advised to continue elevating her leg above the level of for heart whenever possible.  She was also advised to use moist warm compresses to the site.  On occasion as well.  Advised patient that the superficial thrombosis and thrombosis of the small varicose vein to the left distal calf region will eventually resolve.  Advised patient to call/return  or go directed to the emergency department for any worsening symptoms whatsoever.          Patient stated understanding of all instructions; and was in agreement with this plan of care. The patient knows to call the clinic with any problems, questions or concerns.   Review/collaboration with [name] regarding all aspects of patient's visit today.   Total time spent with patient was 25 minutes;  with greater than 75 percent of that time spent in face to face counseling regarding patient's symptoms,  and coordination of care and follow up.  Disclaimer:This dictation was prepared with Dragon/digital dictation along with [employer]. Any transcriptional errors that result from this process are unintentional.  Drue Second,  NP 08/12/2015

## 2015-08-12 NOTE — Assessment & Plan Note (Signed)
Patient received cycle 4, day 8 of her carboplatin/gemcitabine chemotherapy on 08/04/2015.  She received her last Aranesp injection on 07/28/2015.  Most recent labs revealed a WBC of 7.8, ANC 7.3, hemoglobin 7.0, and platelet count of 6.  Patient received a platelet transfusion on Monday, 08/10/2015.  She received a blood transfusion    today.  Patient is scheduled to return on 08/18/2015 for labs, physical, and her next cycle of chemotherapy.

## 2015-08-13 ENCOUNTER — Other Ambulatory Visit: Payer: Self-pay | Admitting: Oncology

## 2015-08-13 ENCOUNTER — Ambulatory Visit (HOSPITAL_COMMUNITY)
Admission: RE | Admit: 2015-08-13 | Discharge: 2015-08-13 | Disposition: A | Discharge status: Home / Self Care | Payer: 59 | Source: Ambulatory Visit | Attending: Oncology | Admitting: Oncology

## 2015-08-13 DIAGNOSIS — D6481 Anemia due to antineoplastic chemotherapy: Secondary | ICD-10-CM | POA: Insufficient documentation

## 2015-08-13 DIAGNOSIS — T451X5A Adverse effect of antineoplastic and immunosuppressive drugs, initial encounter: Secondary | ICD-10-CM | POA: Insufficient documentation

## 2015-08-13 NOTE — Addendum Note (Signed)
Addended by: Adalberto Cole on: 08/13/2015 03:22 PM   Modules accepted: Orders

## 2015-08-18 ENCOUNTER — Other Ambulatory Visit (HOSPITAL_BASED_OUTPATIENT_CLINIC_OR_DEPARTMENT_OTHER): Payer: 59

## 2015-08-18 ENCOUNTER — Other Ambulatory Visit: Payer: Self-pay | Admitting: Oncology

## 2015-08-18 ENCOUNTER — Encounter: Payer: Self-pay | Admitting: Nurse Practitioner

## 2015-08-18 ENCOUNTER — Ambulatory Visit (HOSPITAL_BASED_OUTPATIENT_CLINIC_OR_DEPARTMENT_OTHER): Payer: 59 | Admitting: Nurse Practitioner

## 2015-08-18 ENCOUNTER — Ambulatory Visit (HOSPITAL_BASED_OUTPATIENT_CLINIC_OR_DEPARTMENT_OTHER): Payer: 59

## 2015-08-18 VITALS — BP 116/65 | HR 107 | Temp 98.1°F | Resp 18 | Ht 63.0 in | Wt 177.6 lb

## 2015-08-18 DIAGNOSIS — C50312 Malignant neoplasm of lower-inner quadrant of left female breast: Secondary | ICD-10-CM

## 2015-08-18 DIAGNOSIS — Z5111 Encounter for antineoplastic chemotherapy: Secondary | ICD-10-CM

## 2015-08-18 DIAGNOSIS — C50919 Malignant neoplasm of unspecified site of unspecified female breast: Secondary | ICD-10-CM

## 2015-08-18 DIAGNOSIS — D649 Anemia, unspecified: Secondary | ICD-10-CM

## 2015-08-18 DIAGNOSIS — C787 Secondary malignant neoplasm of liver and intrahepatic bile duct: Secondary | ICD-10-CM | POA: Diagnosis not present

## 2015-08-18 DIAGNOSIS — C770 Secondary and unspecified malignant neoplasm of lymph nodes of head, face and neck: Secondary | ICD-10-CM | POA: Diagnosis not present

## 2015-08-18 DIAGNOSIS — R5383 Other fatigue: Secondary | ICD-10-CM

## 2015-08-18 LAB — CBC WITH DIFFERENTIAL/PLATELET
BASO%: 0.6 % (ref 0.0–2.0)
Basophils Absolute: 0 10*3/uL (ref 0.0–0.1)
EOS%: 0.9 % (ref 0.0–7.0)
Eosinophils Absolute: 0.1 10*3/uL (ref 0.0–0.5)
HEMATOCRIT: 25.1 % — AB (ref 34.8–46.6)
HEMOGLOBIN: 7.8 g/dL — AB (ref 11.6–15.9)
LYMPH#: 0.9 10*3/uL (ref 0.9–3.3)
LYMPH%: 12.9 % — ABNORMAL LOW (ref 14.0–49.7)
MCH: 28.6 pg (ref 25.1–34.0)
MCHC: 31.1 g/dL — ABNORMAL LOW (ref 31.5–36.0)
MCV: 91.9 fL (ref 79.5–101.0)
MONO#: 1.4 10*3/uL — AB (ref 0.1–0.9)
MONO%: 20.5 % — ABNORMAL HIGH (ref 0.0–14.0)
NEUT%: 65.1 % (ref 38.4–76.8)
NEUTROS ABS: 4.5 10*3/uL (ref 1.5–6.5)
Platelets: 133 10*3/uL — ABNORMAL LOW (ref 145–400)
RBC: 2.73 10*6/uL — ABNORMAL LOW (ref 3.70–5.45)
RDW: 20.3 % — AB (ref 11.2–14.5)
WBC: 7 10*3/uL (ref 3.9–10.3)
nRBC: 4 % — ABNORMAL HIGH (ref 0–0)

## 2015-08-18 LAB — COMPREHENSIVE METABOLIC PANEL (CC13)
ALBUMIN: 2.6 g/dL — AB (ref 3.5–5.0)
ALK PHOS: 169 U/L — AB (ref 40–150)
ALT: 35 U/L (ref 0–55)
AST: 40 U/L — AB (ref 5–34)
Anion Gap: 7 mEq/L (ref 3–11)
BUN: 7.9 mg/dL (ref 7.0–26.0)
CALCIUM: 8.6 mg/dL (ref 8.4–10.4)
CHLORIDE: 107 meq/L (ref 98–109)
CO2: 29 mEq/L (ref 22–29)
CREATININE: 0.7 mg/dL (ref 0.6–1.1)
EGFR: >90 mL/min/{1.73_m2} (ref >90)
GLUCOSE: 95 mg/dL (ref 70–140)
POTASSIUM: 3.9 meq/L (ref 3.5–5.1)
SODIUM: 143 meq/L (ref 136–145)
Total Bilirubin: 0.73 mg/dL (ref 0.20–1.20)
Total Protein: 5.1 g/dL — ABNORMAL LOW (ref 6.4–8.3)

## 2015-08-18 MED ORDER — HEPARIN SOD (PORK) LOCK FLUSH 100 UNIT/ML IV SOLN
500.0000 [IU] | Freq: Once | INTRAVENOUS | Status: AC | PRN
  Administered 2015-08-18: 500 [IU]
  Filled 2015-08-18: qty 5

## 2015-08-18 MED ORDER — DARBEPOETIN ALFA 500 MCG/ML IJ SOSY
500.0000 ug | PREFILLED_SYRINGE | Freq: Once | INTRAMUSCULAR | Status: AC
  Administered 2015-08-18: 500 ug via SUBCUTANEOUS
  Filled 2015-08-18: qty 1

## 2015-08-18 MED ORDER — SODIUM CHLORIDE 0.9 % IV SOLN
Freq: Once | INTRAVENOUS | Status: AC
  Administered 2015-08-18: 13:00:00 via INTRAVENOUS
  Filled 2015-08-18: qty 4

## 2015-08-18 MED ORDER — SODIUM CHLORIDE 0.9 % IV SOLN
Freq: Once | INTRAVENOUS | Status: AC
  Administered 2015-08-18: 12:00:00 via INTRAVENOUS

## 2015-08-18 MED ORDER — SODIUM CHLORIDE 0.9 % IV SOLN
202.0500 mg | Freq: Once | INTRAVENOUS | Status: AC
  Administered 2015-08-18: 200 mg via INTRAVENOUS
  Filled 2015-08-18: qty 20

## 2015-08-18 MED ORDER — SODIUM CHLORIDE 0.9 % IJ SOLN
10.0000 mL | INTRAMUSCULAR | Status: DC | PRN
  Administered 2015-08-18: 10 mL
  Filled 2015-08-18: qty 10

## 2015-08-18 MED ORDER — GEMCITABINE HCL CHEMO INJECTION 1 GM/26.3ML
800.0000 mg/m2 | Freq: Once | INTRAVENOUS | Status: AC
  Administered 2015-08-18: 1520 mg via INTRAVENOUS
  Filled 2015-08-18: qty 39.98

## 2015-08-18 NOTE — Progress Notes (Signed)
ID: Kristin Hoffman OB: [birth year]  MR#: [id number]  RJJ#:[id number]  PCP: Drake Leach, MD GYN:  [name] SU: [name] OTHER MD: [name], [name]  CHIEF COMPLAINT:  Stage IV breast cancer  CURRENT TREATMENT: carboplatin/ gemcitabine/ aranesp  BREAST CANCER HISTORY: This patient was previously followed by [name], and was transferred to [name] service as of 08/20/2013.  At the age of 2, the patient had a screening mammogram as a baseline. She had recently given birth and was on birth control pills at that time. The mammogram in November 2006 showed an area of architectural distortion in the left lower inner quadrant. Subsequently an ultrasound was obtained of the left breast showing a 2.0 x 1.5 x 1.4 cm mass, at the 8:00 position, 4 cm from the nipple. A core biopsy performed on 10/21/2005 showed an invasive mammary carcinoma, ER +70%, PR +41%, HER-2/neu negative, with proliferation marker of 38%. [id number]  Breast MRI on 11/08/2005 confirmed a 2.5 cm spiculated mass in the left lower inner quadrant with 3 small satellite nodules adjacent to the primary mass: 3.5 cm posterior medial to the primary mass, measuring 9 mm; 1.5 cm anterior medial to the primary mass measuring 5 mm; and 2 cm medial to the primary mass measuring 5 mm. No axillary adenopathy was noted. No suspicious masses or enhancement are noted in the right breast.  Maryln underwentt left lumpectomy under the care of Dr. 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 2 of 23 lymph nodes were involved.  Margins were clear.  She received adjuvant chemotherapy consisting of 6 q. three-week doses of docetaxel/doxorubicin/cyclophosphamide given between January 2007 and may of 2007, with last dose on 04/20/2006. She underwent radiation therapy completed 07/11/2006, after which she began on tamoxifen in  early August 2007.  She underwent hysterectomy with bilateral salpingo-oophorectomy on 07/15/2008, and was started on letrozole in October of 2009.  A mammogram 11/10/2009 showed microcalcifications in the left breast, and a subsequent biopsy on 11/18/2009 confirmed invasive mammary carcinoma in the left breast. PET and CT of the chest showed no evidence of metastasis in January 2011.  She underwent bilateral mastectomies 01/25/2010, with the right breast showing no evidence of malignancy; in the left breast showing a 1.0 cm grade 2 invasive ductal carcinoma with high-grade DCIS. Tumor was ER +22%, PR negative, HER-2/neu negative, with proliferation marker of 13%. Margins were clear. Patient underwent concurrent left latissimus flap reconstruction and right implant reconstruction.  She received additional chemotherapy with one cycle of carboplatin/gemcitabine given on 03/11/2010. She then received 5 q. three-week cycles of IV CMF between 03/25/2010 and 06/17/2010.  She was started on exemestane in January 2012 and continued until late November 2013 when she was hospitalized for apparent TIA.   Her subsequent history is as detailed below  INTERVAL HISTORY: [name] returns today for follow up of her metastatic breast cancer, alone. Today is day 1 cycle 5 of 6 planned cycles of carboplatin and gemcitabine given on days 1 and 8 of each cycle. She receives Neupogen on days 2,  3 and 4 of each cycle and then onpro on day 9. Carboplatin dose has been reduced to AUC 1.5 due to previous thrombocytopenia.  REVIEW OF SYSTEMS: Kristin Hoffman has had a rough time since her last visit. She was very nauseous and even vomited once after her feraheme infusion. She also suffered leg swelling. An ultrasound  did not show a DVT but there were some small superficial clots found. She has been more tired than usual and is constantly napping. She had immense bone pain after her neulasta injection, which does not usually happen with her.  Claritin and tramadol were not helpful. She continues on 2LO2 PRN/at night and is breathing well. She has a rare dry cough, and denies sputum, pleurisy, and hempotysis. A detailed review of systems is otherwise stable.  PAST MEDICaL HISTORY: Past Medical History  Diagnosis Date  . Depression   . Reflux   . Hx-TIA (transient ischemic attack) 11/22/2013  . Chronic headaches 11/22/2013  . Anxiety 11/22/2013  . Breast cancer   . Cancer   . Breast cancer metastasized to multiple sites 12/24/2013    PAST SURGICAL HISTORY: Past Surgical History  Procedure Laterality Date  . Mastectomy w/ nodes partial    . Mastectomy    . Breast reconstruction    . Portacath placement      x 2  . Portacath removal      x 2  . Abdominal hysterectomy    . Tee without cardioversion  11/12/2012    Procedure: TRANSESOPHAGEAL ECHOCARDIOGRAM (TEE);  Surgeon: Candee Furbish, MD;  Location: [facility] ENDOSCOPY;  Service: Cardiovascular;  Laterality: N/A;  This TEE may be Dr. Marlou Porch or a LHC Dr    FAMILY HISTORY Both parents are alive and well. Patient has one sister who is 32 years younger and is in good health. No other history of breast or ovarian cancer in the family. Family History  Problem Relation Age of Onset  . Diabetes Mellitus II Neg Hx   . Hypertension Neg Hx     GYNECOLOGIC HISTORY:   (Updated January 2015) G2P2, menarche at age 49 with irregular menses. On birth control pills in the past. On Clomid to induce ovulation with first pregnancy. Also had preeclampsia with first pregnancy. Status post hysterectomy and bilateral salpingo-oophorectomy in August 2009.  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 She is originally from [place]. She's been married to [place], for 13 years. He works as an Pharmacist, hospital at [employer].   ADVANCED DIRECTIVES:  Not in place; Not in place. At the clinic visit 08/18/2015 the  patient was given the appropriate forms to complete and notarize at her discretion.    HEALTH MAINTENANCE:  (Updated 11/22/2013) Social History  Substance Use Topics  . Smoking status: Never Smoker   . Smokeless tobacco: Never Used  . Alcohol Use: Yes     Comment: rarely     Colonoscopy:  Never  PAP: S/P LAVH/BSO in August 2009  Bone density: Never  Lipid panel: Not on file   Allergies  Allergen Reactions  . Afinitor [Everolimus] Hives and Itching    Current Outpatient Prescriptions  Medication Sig Dispense Refill  . acidophilus (RISAQUAD) CAPS capsule Take 1 capsule by mouth every morning.     . Biotin 5000 MCG CAPS Take 5,000 mcg by mouth every morning.  30 capsule   . cetirizine (ZYRTEC) 10 MG tablet Take 10 mg by mouth at bedtime.     . Cholecalciferol 2000 UNITS CHEW Chew 1 tablet by mouth daily with breakfast.     . escitalopram (LEXAPRO) 20 MG tablet Take 1 tablet (20 mg total) by mouth daily. 30 tablet 12  . filgrastim (NEUPOGEN) 300 MCG/0.5ML SOSY injection Inject 0.5 mLs (300 mcg total)  into the skin once. 6 Syringe 1  . LORazepam (ATIVAN) 0.5 MG tablet Take 1 tablet (0.5 mg total) by mouth every 6 (six) hours as needed for anxiety. 120 tablet 1  . Melatonin 10 MG TABS Take 1 tablet by mouth at bedtime.    . montelukast (SINGULAIR) 10 MG tablet Take 10 mg by mouth daily with breakfast.     . Multiple Vitamin (MULTIVITAMIN) tablet Take 1 tablet by mouth every morning.     Marland Kitchen omeprazole (PRILOSEC) 40 MG capsule Take 40 mg by mouth every morning.     . ondansetron (ZOFRAN) 8 MG tablet Take 8 mg by mouth 2 (two) times daily. Starts day after chemo    . prochlorperazine (COMPAZINE) 10 MG tablet Take 10 mg by mouth every 6 (six) hours as needed for nausea or vomiting.     . traMADol (ULTRAM) 50 MG tablet Take 1 tablet (50 mg total) by mouth every 6 (six) hours as needed. 120 tablet 1  . Ibuprofen (ADVIL) 200 MG CAPS Take 2 capsules (400 mg total) by mouth 3 (three) times  daily with meals as needed. (Patient not taking: Reported on 08/18/2015) 120 each 0  . nystatin (MYCOSTATIN) 100000 UNIT/ML suspension Take 5 mLs (500,000 Units total) by mouth 4 (four) times daily. (Patient not taking: Reported on 07/13/2015) 240 mL 1   No current facility-administered medications for this visit.    OBJECTIVE: Young white woman who appears stated age 63 Vitals:   08/18/15 1112  BP: 116/65  Pulse: 107  Temp: 98.1 F (36.7 C)  Resp: 18  Body mass index is 31.47 kg/(m^2).  ECOG: 2-3 Filed Weights   08/18/15 1112  Weight: 177 lb 9.6 oz (80.559 kg)    Skin: warm, dry  HEENT: sclerae anicteric, conjunctivae pink, oropharynx clear. No thrush or mucositis.  Lymph Nodes: No cervical or supraclavicular lymphadenopathy  Lungs: clear to auscultation bilaterally, no rales, wheezes, or rhonci  Heart: regular rate and rhythm  Abdomen: round, soft, non tender, positive bowel sounds  Musculoskeletal: No focal spinal tenderness, no peripheral edema  Neuro: non focal, well oriented, positive affect  Breasts: deferred  LAB RESULTS:   Lab Results  Component Value Date   WBC 7.0 08/18/2015   NEUTROABS 4.5 08/18/2015   HGB 7.8* 08/18/2015   HCT 25.1* 08/18/2015   MCV 91.9 08/18/2015   PLT 133* 08/18/2015      Chemistry      Component Value Date/Time   NA 143 08/18/2015 1058   NA 138 05/30/2015 0525   K 3.9 08/18/2015 1058   K 3.7 05/30/2015 0525   CL 104 05/30/2015 0525   CL 104 11/14/2012 1407   CO2 29 08/18/2015 1058   CO2 27 05/30/2015 0525   BUN 7.9 08/18/2015 1058   BUN 16 05/30/2015 0525   CREATININE 0.7 08/18/2015 1058   CREATININE 0.68 05/30/2015 0525      Component Value Date/Time   CALCIUM 8.6 08/18/2015 1058   CALCIUM 8.2* 05/30/2015 0525   ALKPHOS 169* 08/18/2015 1058   ALKPHOS 88 05/30/2015 0525   AST 40* 08/18/2015 1058   AST 193* 05/30/2015 0525   ALT 35 08/18/2015 1058   ALT 76* 05/30/2015 0525   BILITOT 0.73 08/18/2015 1058   BILITOT 1.0  05/30/2015 0525      STUDIES: Ct Chest W Contrast  07/27/2015   CLINICAL DATA:  Recurrent left breast cancer status post bilateral mastectomy, chemotherapy in progress  EXAM: CT CHEST, ABDOMEN, AND PELVIS WITH  CONTRAST  TECHNIQUE: Multidetector CT imaging of the chest, abdomen and pelvis was performed following the standard protocol during bolus administration of intravenous contrast.  CONTRAST:  150m OMNIPAQUE IOHEXOL 300 MG/ML  SOLN  COMPARISON:  CTA chest dated 05/29/2015. CT chest abdomen pelvis dated 05/22/2015.  FINDINGS: CT CHEST FINDINGS  Mediastinum/Nodes: Heart is normal in size. No pericardial effusion.  Aberrant right subclavian artery. Right chest port terminates in the upper right atrium.  Necrotic thoracic nodal metastases are mildly improved, including:  --2.5 x 2.8 cm left supraclavicular node (series 2/ image 51), previously 2.6 x 3.4 cm  --1.8 x 2.1 cm high right paratracheal node (series 2/image 12), previously 2.5 x 2.6 cm  --2.3 x 2.7 cm left prevascular node (series 2/image 16), previously 3.8 x 4.3 cm  Additional small calcified mediastinal nodes, benign. Status post left axillary lymph node dissection.  Visualized thyroid is unremarkable.  Lungs/Pleura: 1.8 x 2.3 cm necrotic mass along the anterior right middle lobe (series 2/image 30), previously 3.3 cm. Associated trace right pleural effusion.  7.8 x 4.0 cm mass along the anterior left upper lobe/chest wall (series 2/ image 18), previously 8.2 x 5.6 cm. Associated small left pleural effusion, some of which is loculated anteriorly along the left chest wall mass.  Again noted is interlobular septal thickening with scattered nodularity, worrisome for lymphangitic spread of tumor. Discrete pulmonary nodularity is less evident on the current study and favored to be improved. A 6 mm pulmonary nodules present in the left lung apex (series 4/image 11) previously 7 mm.  No pneumothorax.  Musculoskeletal: Status post bilateral mastectomy with  reconstruction.  Mild degenerative changes of the thoracic spine. No focal osseous lesions.  CT ABDOMEN PELVIS FINDINGS  Hepatobiliary: Vague, heterogeneously enhancing lesion in the anterior right hepatic dome is difficult to discretely measure on the current study but measures approximately 5.1 x 5.6 cm, previously 6.9 x 7.6 cm.  2.5 x 1.8 cm hypoenhancing lesion in the caudate, previously 2.7 x 2.2 cm.  Additional areas of heterogeneous perfusion.  Gallbladder is decompressed. No intrahepatic or extrahepatic ductal dilatation.  Pancreas: Pancreas is notable for a 2.9 x 2.9 cm hypoenhancing mass in the pancreatic head (series 2/ image 58), previously 4.3 x 3.2 cm. Associated atrophy the pancreatic body/ tail.  Spleen: Within normal limits.  Adrenals/Urinary Tract: Adrenal glands are within normal limits.  Kidneys are within normal limits.  No hydronephrosis.  Bladder is mildly thick-walled although underdistended.  Stomach/Bowel: Stomach is within normal limits.  No evidence of bowel obstruction.  Normal appendix.  Vascular/Lymphatic: No evidence of abdominal aortic aneurysm.  Circumaortic left renal vein.  Previously described portacaval lymphadenopathy is difficult to distinguish from the adjacent liver on the current study. Otherwise, no suspicious abdominopelvic lymphadenopathy.  Reproductive: Status post hysterectomy.  No adnexal mass.  Other: No abdominopelvic ascites.  Musculoskeletal: Visualized osseous structures are within normal limits.  IMPRESSION: Status post bilateral mastectomy with left axillary lymph node dissection.  Necrotic thoracic lymphadenopathy, mildly improved.  Pleural/pulmonary metastases with lymphangitic spread, as described above, improved.  Hepatic and pancreatic metastases, improved.   Electronically Signed   By: SJulian HyM.D.   On: 07/27/2015 15:35   Ct Abdomen Pelvis W Contrast  07/27/2015   CLINICAL DATA:  Recurrent left breast cancer status post bilateral mastectomy,  chemotherapy in progress  EXAM: CT CHEST, ABDOMEN, AND PELVIS WITH CONTRAST  TECHNIQUE: Multidetector CT imaging of the chest, abdomen and pelvis was performed following the standard protocol during bolus administration  of intravenous contrast.  CONTRAST:  161m OMNIPAQUE IOHEXOL 300 MG/ML  SOLN  COMPARISON:  CTA chest dated 05/29/2015. CT chest abdomen pelvis dated 05/22/2015.  FINDINGS: CT CHEST FINDINGS  Mediastinum/Nodes: Heart is normal in size. No pericardial effusion.  Aberrant right subclavian artery. Right chest port terminates in the upper right atrium.  Necrotic thoracic nodal metastases are mildly improved, including:  --2.5 x 2.8 cm left supraclavicular node (series 2/ image 51), previously 2.6 x 3.4 cm  --1.8 x 2.1 cm high right paratracheal node (series 2/image 12), previously 2.5 x 2.6 cm  --2.3 x 2.7 cm left prevascular node (series 2/image 16), previously 3.8 x 4.3 cm  Additional small calcified mediastinal nodes, benign. Status post left axillary lymph node dissection.  Visualized thyroid is unremarkable.  Lungs/Pleura: 1.8 x 2.3 cm necrotic mass along the anterior right middle lobe (series 2/image 30), previously 3.3 cm. Associated trace right pleural effusion.  7.8 x 4.0 cm mass along the anterior left upper lobe/chest wall (series 2/ image 18), previously 8.2 x 5.6 cm. Associated small left pleural effusion, some of which is loculated anteriorly along the left chest wall mass.  Again noted is interlobular septal thickening with scattered nodularity, worrisome for lymphangitic spread of tumor. Discrete pulmonary nodularity is less evident on the current study and favored to be improved. A 6 mm pulmonary nodules present in the left lung apex (series 4/image 11) previously 7 mm.  No pneumothorax.  Musculoskeletal: Status post bilateral mastectomy with reconstruction.  Mild degenerative changes of the thoracic spine. No focal osseous lesions.  CT ABDOMEN PELVIS FINDINGS  Hepatobiliary: Vague,  heterogeneously enhancing lesion in the anterior right hepatic dome is difficult to discretely measure on the current study but measures approximately 5.1 x 5.6 cm, previously 6.9 x 7.6 cm.  2.5 x 1.8 cm hypoenhancing lesion in the caudate, previously 2.7 x 2.2 cm.  Additional areas of heterogeneous perfusion.  Gallbladder is decompressed. No intrahepatic or extrahepatic ductal dilatation.  Pancreas: Pancreas is notable for a 2.9 x 2.9 cm hypoenhancing mass in the pancreatic head (series 2/ image 58), previously 4.3 x 3.2 cm. Associated atrophy the pancreatic body/ tail.  Spleen: Within normal limits.  Adrenals/Urinary Tract: Adrenal glands are within normal limits.  Kidneys are within normal limits.  No hydronephrosis.  Bladder is mildly thick-walled although underdistended.  Stomach/Bowel: Stomach is within normal limits.  No evidence of bowel obstruction.  Normal appendix.  Vascular/Lymphatic: No evidence of abdominal aortic aneurysm.  Circumaortic left renal vein.  Previously described portacaval lymphadenopathy is difficult to distinguish from the adjacent liver on the current study. Otherwise, no suspicious abdominopelvic lymphadenopathy.  Reproductive: Status post hysterectomy.  No adnexal mass.  Other: No abdominopelvic ascites.  Musculoskeletal: Visualized osseous structures are within normal limits.  IMPRESSION: Status post bilateral mastectomy with left axillary lymph node dissection.  Necrotic thoracic lymphadenopathy, mildly improved.  Pleural/pulmonary metastases with lymphangitic spread, as described above, improved.  Hepatic and pancreatic metastases, improved.   Electronically Signed   By: SJulian HyM.D.   On: 07/27/2015 15:35     ASSESSMENT: 49y.o. [place] woman  (1)  status post left lumpectomy under the care of Dr. SMargot Chimeson 11/18/2005 for a 2.5 cm grade 3 invasive ductal carcinoma, ER +70%, PR +41%, HER-2/neu negative, with proliferation marker of 38%. 2 of 23 lymph nodes were  involved.  Margins were clear.  (2) status post adjuvant chemotherapy consisting of 6 q. three-week doses of docetaxel/ doxorubicin/ cyclophosphamide given between January 2007  and may of 2007, with last dose on 04/20/2006.  (3) status post radiation therapy to the left breast, completed 07/11/2006  (4) began on tamoxifen in early August 2007 and continued until October 2009. She status post hysterectomy with bilateral salpingo-oophorectomy on 07/15/2008, and was started on letrozole in October of 2009.  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 PET and CT of the chest showed no evidence of metastasis in January 2011.  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 Margins were clear. Patient underwent concurrent left latissimus flap reconstruction and right implant reconstruction.  (7)  status post additional chemotherapy with one cycle of carboplatin/gemcitabine given on 03/11/2010. She then received 5 q. three-week cycles of IV CMF between 03/25/2010 and 06/17/2010.  (8) started on exemestane in January 2012 and continued until late November 2013 when she was hospitalized for apparent TIA at which time her exemestane was stopped and not resumed  (9) METASTATIC DISEASE: evidence of disease recurence noted on chest CT, liver MRI and PET scan in December 2014, with suspicious lung nodules, lymphadenopathy, and 3 lesions in the right lobe of the liver, but no evidence of bony disease and no evidence of brain involvement by brain MRI September 2014. Biopsy of a left supraclavicular lymph node on 12/11/2013 confirmed metastatic carcinoma, estrogen receptor 45% positive,  progesterone receptor 17% positive, with no HER-2 amplification.  (10) fulvestrant started 12/03/2013, discontinued 03/25/2014 with progression  (11) exemestane/ everolimus started 04/04/2014; everolimus held 04/14/2014, with skin rash and mouth soreness; resumed at 5 mg a day as of 04/29/2014, discontinued 05/09/2014 with similar symptoms  (12) continueing exemestane, adding palbociclib 05/26/2014;   (a) palbociclib dose dropped to 75 mg every other day for 21 days beginning with cycle 4  (b) exemestane and Palbociclib discontinued December 2015 with evidence of progression  (13) [facility] referral placed 04/28/2049 -- re-sent 05/23/2014-- shows Pi3KCA and TP53 mutations  (a) [facility] one test requested 12/10/2014-- confirms [employer] results  (b) did not qualify for capecitabine/ BYL 719 trial because of elevated lipase  (14) started eribulin 12/26/2014, 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   (a) dpse dropped from 2g BID to 1.5g BID starting on 04/26/15  (b) stopped 05/17/2015 (after 4 cycles) with progression  (16) started carboplatin/ gemcitabine 05/26/2015, to be given day one and day 8 of each 21 day cycle  (a) given her history of severe neutropenia with this chemotherapy we'll do Neupogen days 2, 3 and 4 of each cycle and onpro day 9  (b) carbo dose dropped by 25% w cycle 2 due to very low platelet nadir  (c) CT scans after 3 cycles (07/27/2015) show measurable response  (17) Left pleural effusion\   (a) s/p L thoracentesis 05/22/2015, 05/29/2015 and 06/05/2015  (b) attempt at pleurx 06/12/2015 cancelled as effusion loculated and scant  (18) symptomatic anemia  (a) Aranesp started 07/28/2015  (b) Feraheme to be given 08/04/2015   PLAN:  Johnnie is stable today. She is fatigue more so than usual and this  concerns her. The labs were reviewed in detail  and her hgb is down to 7.8 today. She will has aranesp injection due today, and will proceed with day 1, cycle 5 of carboplatin and gemcitabine as planned.   I advised Airlie to try tylenol along with the claritin for her bone pain.   She will return in 1 week for day 8 of treatment. She understands and agrees with this plan. She knows the goal of treatment in her case is control. She has been encouraged to call with any issues that might arise before her next visit here.   Laurie Panda, NP   08/18/2015 12:01 PM

## 2015-08-18 NOTE — Progress Notes (Signed)
OK to treat per Leo Rod NP. Patient will not receive blood. Patient will receive aranesp injection.

## 2015-08-18 NOTE — Patient Instructions (Signed)
[facility] Discharge Instructions for Patients Receiving Chemotherapy  Today you received the following chemotherapy agents Gemcitabine/Carboplatin.  To help prevent nausea and vomiting after your treatment, we encourage you to take your nausea medication as directed.    If you develop nausea and vomiting that is not controlled by your nausea medication, call the clinic.   BELOW ARE SYMPTOMS THAT SHOULD BE REPORTED IMMEDIATELY:  *FEVER GREATER THAN 100.5 F  *CHILLS WITH OR WITHOUT FEVER  NAUSEA AND VOMITING THAT IS NOT CONTROLLED WITH YOUR NAUSEA MEDICATION  *UNUSUAL SHORTNESS OF BREATH  *UNUSUAL BRUISING OR BLEEDING  TENDERNESS IN MOUTH AND THROAT WITH OR WITHOUT PRESENCE OF ULCERS  *URINARY PROBLEMS  *BOWEL PROBLEMS  UNUSUAL RASH Items with * indicate a potential emergency and should be followed up as soon as possible.  Feel free to call the clinic you have any questions or concerns. The clinic phone number is [phone].  Please show the [facility] at check-in to the Emergency Department and triage nurse.

## 2015-08-19 ENCOUNTER — Ambulatory Visit (HOSPITAL_BASED_OUTPATIENT_CLINIC_OR_DEPARTMENT_OTHER): Payer: 59

## 2015-08-19 VITALS — BP 116/62 | HR 108 | Temp 99.1°F

## 2015-08-19 DIAGNOSIS — C770 Secondary and unspecified malignant neoplasm of lymph nodes of head, face and neck: Secondary | ICD-10-CM | POA: Diagnosis not present

## 2015-08-19 DIAGNOSIS — C50312 Malignant neoplasm of lower-inner quadrant of left female breast: Secondary | ICD-10-CM

## 2015-08-19 DIAGNOSIS — Z5189 Encounter for other specified aftercare: Secondary | ICD-10-CM

## 2015-08-19 DIAGNOSIS — C50919 Malignant neoplasm of unspecified site of unspecified female breast: Secondary | ICD-10-CM

## 2015-08-19 MED ORDER — TBO-FILGRASTIM 300 MCG/0.5ML ~~LOC~~ SOSY
300.0000 ug | PREFILLED_SYRINGE | Freq: Once | SUBCUTANEOUS | Status: AC
  Administered 2015-08-19: 300 ug via SUBCUTANEOUS
  Filled 2015-08-19: qty 0.5

## 2015-08-20 ENCOUNTER — Ambulatory Visit (HOSPITAL_BASED_OUTPATIENT_CLINIC_OR_DEPARTMENT_OTHER): Payer: 59

## 2015-08-20 VITALS — BP 115/63 | HR 117 | Temp 99.0°F

## 2015-08-20 DIAGNOSIS — C50919 Malignant neoplasm of unspecified site of unspecified female breast: Secondary | ICD-10-CM

## 2015-08-20 DIAGNOSIS — Z5189 Encounter for other specified aftercare: Secondary | ICD-10-CM

## 2015-08-20 DIAGNOSIS — C787 Secondary malignant neoplasm of liver and intrahepatic bile duct: Secondary | ICD-10-CM | POA: Diagnosis not present

## 2015-08-20 DIAGNOSIS — C50312 Malignant neoplasm of lower-inner quadrant of left female breast: Secondary | ICD-10-CM

## 2015-08-20 MED ORDER — TBO-FILGRASTIM 300 MCG/0.5ML ~~LOC~~ SOSY
300.0000 ug | PREFILLED_SYRINGE | Freq: Once | SUBCUTANEOUS | Status: AC
  Administered 2015-08-20: 300 ug via SUBCUTANEOUS
  Filled 2015-08-20: qty 0.5

## 2015-08-21 ENCOUNTER — Ambulatory Visit (HOSPITAL_BASED_OUTPATIENT_CLINIC_OR_DEPARTMENT_OTHER): Payer: 59

## 2015-08-21 VITALS — BP 120/70 | HR 112 | Temp 99.4°F

## 2015-08-21 DIAGNOSIS — Z5189 Encounter for other specified aftercare: Secondary | ICD-10-CM | POA: Diagnosis not present

## 2015-08-21 DIAGNOSIS — C50312 Malignant neoplasm of lower-inner quadrant of left female breast: Secondary | ICD-10-CM

## 2015-08-21 DIAGNOSIS — C787 Secondary malignant neoplasm of liver and intrahepatic bile duct: Secondary | ICD-10-CM

## 2015-08-21 DIAGNOSIS — C50919 Malignant neoplasm of unspecified site of unspecified female breast: Secondary | ICD-10-CM

## 2015-08-21 MED ORDER — TBO-FILGRASTIM 300 MCG/0.5ML ~~LOC~~ SOSY
300.0000 ug | PREFILLED_SYRINGE | Freq: Once | SUBCUTANEOUS | Status: AC
  Administered 2015-08-21: 300 ug via SUBCUTANEOUS
  Filled 2015-08-21: qty 0.5

## 2015-08-25 ENCOUNTER — Ambulatory Visit (HOSPITAL_BASED_OUTPATIENT_CLINIC_OR_DEPARTMENT_OTHER): Payer: 59 | Admitting: Nurse Practitioner

## 2015-08-25 ENCOUNTER — Ambulatory Visit (HOSPITAL_BASED_OUTPATIENT_CLINIC_OR_DEPARTMENT_OTHER): Payer: 59

## 2015-08-25 ENCOUNTER — Encounter: Payer: Self-pay | Admitting: Nurse Practitioner

## 2015-08-25 ENCOUNTER — Other Ambulatory Visit (HOSPITAL_BASED_OUTPATIENT_CLINIC_OR_DEPARTMENT_OTHER): Payer: 59

## 2015-08-25 VITALS — BP 134/80 | HR 97 | Temp 99.9°F | Resp 18 | Ht 63.0 in | Wt 176.6 lb

## 2015-08-25 DIAGNOSIS — C50919 Malignant neoplasm of unspecified site of unspecified female breast: Secondary | ICD-10-CM

## 2015-08-25 DIAGNOSIS — C50312 Malignant neoplasm of lower-inner quadrant of left female breast: Secondary | ICD-10-CM

## 2015-08-25 DIAGNOSIS — C770 Secondary and unspecified malignant neoplasm of lymph nodes of head, face and neck: Secondary | ICD-10-CM

## 2015-08-25 DIAGNOSIS — Z5111 Encounter for antineoplastic chemotherapy: Secondary | ICD-10-CM | POA: Diagnosis not present

## 2015-08-25 DIAGNOSIS — C787 Secondary malignant neoplasm of liver and intrahepatic bile duct: Secondary | ICD-10-CM

## 2015-08-25 DIAGNOSIS — Z5189 Encounter for other specified aftercare: Secondary | ICD-10-CM

## 2015-08-25 LAB — CBC WITH DIFFERENTIAL/PLATELET
BASO%: 0.4 % (ref 0.0–2.0)
BASOS ABS: 0 10*3/uL (ref 0.0–0.1)
EOS ABS: 0 10*3/uL (ref 0.0–0.5)
EOS%: 0.6 % (ref 0.0–7.0)
HEMATOCRIT: 25.3 % — AB (ref 34.8–46.6)
HEMOGLOBIN: 8 g/dL — AB (ref 11.6–15.9)
LYMPH#: 0.6 10*3/uL — AB (ref 0.9–3.3)
LYMPH%: 16.8 % (ref 14.0–49.7)
MCH: 28.7 pg (ref 25.1–34.0)
MCHC: 31.7 g/dL (ref 31.5–36.0)
MCV: 90.6 fL (ref 79.5–101.0)
MONO#: 0.7 10*3/uL (ref 0.1–0.9)
MONO%: 18.3 % — AB (ref 0.0–14.0)
NEUT%: 63.9 % (ref 38.4–76.8)
NEUTROS ABS: 2.3 10*3/uL (ref 1.5–6.5)
Platelets: 103 10*3/uL — ABNORMAL LOW (ref 145–400)
RBC: 2.79 10*6/uL — ABNORMAL LOW (ref 3.70–5.45)
RDW: 22.7 % — AB (ref 11.2–14.5)
WBC: 3.6 10*3/uL — AB (ref 3.9–10.3)

## 2015-08-25 LAB — COMPREHENSIVE METABOLIC PANEL (CC13)
ALBUMIN: 2.7 g/dL — AB (ref 3.5–5.0)
ALK PHOS: 169 U/L — AB (ref 40–150)
ALT: 32 U/L (ref 0–55)
AST: 41 U/L — AB (ref 5–34)
Anion Gap: 6 mEq/L (ref 3–11)
BILIRUBIN TOTAL: 0.61 mg/dL (ref 0.20–1.20)
BUN: 11.7 mg/dL (ref 7.0–26.0)
CALCIUM: 8.8 mg/dL (ref 8.4–10.4)
CO2: 30 mEq/L — ABNORMAL HIGH (ref 22–29)
CREATININE: 0.7 mg/dL (ref 0.6–1.1)
Chloride: 108 mEq/L (ref 98–109)
EGFR: >90 mL/min/{1.73_m2} (ref >90)
GLUCOSE: 124 mg/dL (ref 70–140)
POTASSIUM: 3.9 meq/L (ref 3.5–5.1)
SODIUM: 143 meq/L (ref 136–145)
TOTAL PROTEIN: 5.3 g/dL — AB (ref 6.4–8.3)

## 2015-08-25 MED ORDER — PEGFILGRASTIM 6 MG/0.6ML ~~LOC~~ PSKT
6.0000 mg | PREFILLED_SYRINGE | Freq: Once | SUBCUTANEOUS | Status: AC
  Administered 2015-08-25: 6 mg via SUBCUTANEOUS
  Filled 2015-08-25: qty 0.6

## 2015-08-25 MED ORDER — SODIUM CHLORIDE 0.9 % IV SOLN
Freq: Once | INTRAVENOUS | Status: AC
  Administered 2015-08-25: 16:00:00 via INTRAVENOUS

## 2015-08-25 MED ORDER — SODIUM CHLORIDE 0.9 % IV SOLN
175.1100 mg | Freq: Once | INTRAVENOUS | Status: AC
  Administered 2015-08-25: 180 mg via INTRAVENOUS
  Filled 2015-08-25: qty 18

## 2015-08-25 MED ORDER — SODIUM CHLORIDE 0.9 % IV SOLN
Freq: Once | INTRAVENOUS | Status: AC
  Administered 2015-08-25: 16:00:00 via INTRAVENOUS
  Filled 2015-08-25: qty 4

## 2015-08-25 MED ORDER — HEPARIN SOD (PORK) LOCK FLUSH 100 UNIT/ML IV SOLN
500.0000 [IU] | Freq: Once | INTRAVENOUS | Status: AC | PRN
  Administered 2015-08-25: 500 [IU]
  Filled 2015-08-25: qty 5

## 2015-08-25 MED ORDER — SODIUM CHLORIDE 0.9 % IV SOLN
800.0000 mg/m2 | Freq: Once | INTRAVENOUS | Status: AC
  Administered 2015-08-25: 1520 mg via INTRAVENOUS
  Filled 2015-08-25: qty 40

## 2015-08-25 MED ORDER — SODIUM CHLORIDE 0.9 % IJ SOLN
10.0000 mL | INTRAMUSCULAR | Status: DC | PRN
  Administered 2015-08-25: 10 mL
  Filled 2015-08-25: qty 10

## 2015-08-25 NOTE — Progress Notes (Signed)
ID: Kristin Hoffman OB: 01-Jul-1966  MR#: [id number]  HYQ#:[id number]  PCP: Drake Leach, MD GYN:  [name] SU: [name] OTHER MD: [name], [name]  CHIEF COMPLAINT:  Stage IV breast cancer  CURRENT TREATMENT: carboplatin/ gemcitabine/ aranesp  BREAST CANCER HISTORY: This patient was previously followed by [name], and was transferred to [name] service as of 08/20/2013.  At the age of 49, the patient had a screening mammogram as a baseline. She had recently given birth and was on birth control pills at that time. The mammogram in November 2006 showed an area of architectural distortion in the left lower inner quadrant. Subsequently an ultrasound was obtained of the left breast showing a 2.0 x 1.5 x 1.4 cm mass, at the 8:00 position, 4 cm from the nipple. A core biopsy performed on 10/21/2005 showed an invasive mammary carcinoma, ER +70%, PR +41%, HER-2/neu negative, with proliferation marker of 38%. [phone]  Breast MRI on 11/08/2005 confirmed a 2.5 cm spiculated mass in the left lower inner quadrant with 3 small satellite nodules adjacent to the primary mass: 3.5 cm posterior medial to the primary mass, measuring 9 mm; 1.5 cm anterior medial to the primary mass measuring 5 mm; and 2 cm medial to the primary mass measuring 5 mm. No axillary adenopathy was noted. No suspicious masses or enhancement are noted in the right breast.  Kristin Hoffman underwentt left lumpectomy under the care of Dr. 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 2 of 23 lymph nodes were involved.  Margins were clear.  She received adjuvant chemotherapy consisting of 6 q. three-week doses of docetaxel/doxorubicin/cyclophosphamide given between January 2007 and may of 2007, with last dose on 04/20/2006. She underwent radiation therapy completed 07/11/2006, after which she began on tamoxifen in  early August 2007.  She underwent hysterectomy with bilateral salpingo-oophorectomy on 07/15/2008, and was started on letrozole in October of 2009.  A mammogram 11/10/2009 showed microcalcifications in the left breast, and a subsequent biopsy on 11/18/2009 confirmed invasive mammary carcinoma in the left breast. PET and CT of the chest showed no evidence of metastasis in January 2011.  She underwent bilateral mastectomies 01/25/2010, with the right breast showing no evidence of malignancy; in the left breast showing a 1.0 cm grade 2 invasive ductal carcinoma with high-grade DCIS. Tumor was ER +22%, PR negative, HER-2/neu negative, with proliferation marker of 13%. Margins were clear. Patient underwent concurrent left latissimus flap reconstruction and right implant reconstruction.  She received additional chemotherapy with one cycle of carboplatin/gemcitabine given on 03/11/2010. She then received 5 q. three-week cycles of IV CMF between 03/25/2010 and 06/17/2010.  She was started on exemestane in January 2012 and continued until late November 2013 when she was hospitalized for apparent TIA.   Her subsequent history is as detailed below  INTERVAL HISTORY: Kristin Hoffman returns today for follow up of her metastatic breast cancer, alone. Today is day 8 cycle 5 of 6 planned cycles of carboplatin and gemcitabine given on days 1 and 8 of each cycle. She receives Neupogen on days 2,  3 and 4 of each cycle and then onpro on day 9. Carboplatin dose has been reduced to AUC 1.5 due to previous thrombocytopenia.  REVIEW OF SYSTEMS: Kristin Hoffman denies fevers, chills, nausea, vomiting, or changes in bowel or bladder habits. She believes she is experiencing less shortness of breath. She is able to get more accomplished  without having to sit down. Unfortunately her fatigue is no better. She sleeps for several hours during the day and then sleeps all night. She continues on tramadol and advil for her back pain. She denies mouth  sores, rashes, or neuropathy symptoms. A detailed review of systems is otherwise stable.  PAST MEDICaL HISTORY: Past Medical History  Diagnosis Date  . Depression   . Reflux   . Hx-TIA (transient ischemic attack) 11/22/2013  . Chronic headaches 11/22/2013  . Anxiety 11/22/2013  . Breast cancer   . Cancer   . Breast cancer metastasized to multiple sites 12/24/2013    PAST SURGICAL HISTORY: Past Surgical History  Procedure Laterality Date  . Mastectomy w/ nodes partial    . Mastectomy    . Breast reconstruction    . Portacath placement      x 2  . Portacath removal      x 2  . Abdominal hysterectomy    . Tee without cardioversion  11/12/2012    Procedure: TRANSESOPHAGEAL ECHOCARDIOGRAM (TEE);  Surgeon: Candee Furbish, MD;  Location: [facility] ENDOSCOPY;  Service: Cardiovascular;  Laterality: N/A;  This TEE may be Dr. Marlou Porch or a LHC Dr    FAMILY HISTORY Both parents are alive and well. Patient has one sister who is 50 years younger and is in good health. No other history of breast or ovarian cancer in the family. Family History  Problem Relation Age of Onset  . Diabetes Mellitus II Neg Hx   . Hypertension Neg Hx     GYNECOLOGIC HISTORY:   (Updated January 2015) G2P2, menarche at age 41 with irregular menses. On birth control pills in the past. On Clomid to induce ovulation with first pregnancy. Also had preeclampsia with first pregnancy. Status post hysterectomy and bilateral salpingo-oophorectomy in August 2009.  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 She is originally from [place]. She's been married to [place], for 13 years. He works as an Pharmacist, hospital at [employer].   ADVANCED DIRECTIVES:  Not in place; Not in place. At the clinic visit 08/25/2015 the patient was given the appropriate forms to complete and notarize at her discretion.    HEALTH MAINTENANCE:  (Updated  11/22/2013) Social History  Substance Use Topics  . Smoking status: Never Smoker   . Smokeless tobacco: Never Used  . Alcohol Use: Yes     Comment: rarely     Colonoscopy:  Never  PAP: S/P LAVH/BSO in August 2009  Bone density: Never  Lipid panel: Not on file   Allergies  Allergen Reactions  . Afinitor [Everolimus] Hives and Itching    Current Outpatient Prescriptions  Medication Sig Dispense Refill  . acidophilus (RISAQUAD) CAPS capsule Take 1 capsule by mouth every morning.     . Biotin 5000 MCG CAPS Take 5,000 mcg by mouth every morning.  30 capsule   . cetirizine (ZYRTEC) 10 MG tablet Take 10 mg by mouth at bedtime.     . Cholecalciferol 2000 UNITS CHEW Chew 1 tablet by mouth daily with breakfast.     . escitalopram (LEXAPRO) 20 MG tablet Take 1 tablet (20 mg total) by mouth daily. 30 tablet 12  . filgrastim (NEUPOGEN) 300 MCG/0.5ML SOSY injection Inject 0.5 mLs (300 mcg total) into the skin once. 6 Syringe 1  . Ibuprofen (ADVIL) 200 MG CAPS Take 2 capsules (400 mg total) by mouth 3 (three) times daily  with meals as needed. 120 each 0  . LORazepam (ATIVAN) 0.5 MG tablet Take 1 tablet (0.5 mg total) by mouth every 6 (six) hours as needed for anxiety. 120 tablet 1  . Melatonin 10 MG TABS Take 1 tablet by mouth at bedtime.    . montelukast (SINGULAIR) 10 MG tablet Take 10 mg by mouth daily with breakfast.     . Multiple Vitamin (MULTIVITAMIN) tablet Take 1 tablet by mouth every morning.     Marland Kitchen omeprazole (PRILOSEC) 40 MG capsule Take 40 mg by mouth every morning.     . traMADol (ULTRAM) 50 MG tablet Take 1 tablet (50 mg total) by mouth every 6 (six) hours as needed. 120 tablet 1  . nystatin (MYCOSTATIN) 100000 UNIT/ML suspension Take 5 mLs (500,000 Units total) by mouth 4 (four) times daily. (Patient not taking: Reported on 07/13/2015) 240 mL 1  . ondansetron (ZOFRAN) 8 MG tablet Take 8 mg by mouth 2 (two) times daily. Starts day after chemo    . prochlorperazine (COMPAZINE) 10 MG  tablet Take 10 mg by mouth every 6 (six) hours as needed for nausea or vomiting.      No current facility-administered medications for this visit.    OBJECTIVE: Young white woman who appears stated age 49 Vitals:   08/25/15 1411  BP: 134/80  Pulse: 97  Temp: 99.9 F (37.7 C)  Resp: 18  Body mass index is 31.29 kg/(m^2).  ECOG: 2-3 Filed Weights   08/25/15 1411  Weight: 176 lb 9.6 oz (80.105 kg)   Sclerae unicteric, pupils round and equal Oropharynx clear and moist-- no thrush or other lesions No cervical or supraclavicular adenopathy Lungs no rales or rhonchi Heart regular rate and rhythm Abd soft, nontender, positive bowel sounds MSK no focal spinal tenderness, no upper extremity lymphedema Neuro: nonfocal, well oriented, appropriate affect Breasts: deferred  LAB RESULTS:   Lab Results  Component Value Date   WBC 3.6* 08/25/2015   NEUTROABS 2.3 08/25/2015   HGB 8.0* 08/25/2015   HCT 25.3* 08/25/2015   MCV 90.6 08/25/2015   PLT 103* 08/25/2015      Chemistry      Component Value Date/Time   NA 143 08/25/2015 1354   NA 138 05/30/2015 0525   K 3.9 08/25/2015 1354   K 3.7 05/30/2015 0525   CL 104 05/30/2015 0525   CL 104 11/14/2012 1407   CO2 30* 08/25/2015 1354   CO2 27 05/30/2015 0525   BUN 11.7 08/25/2015 1354   BUN 16 05/30/2015 0525   CREATININE 0.7 08/25/2015 1354   CREATININE 0.68 05/30/2015 0525      Component Value Date/Time   CALCIUM 8.8 08/25/2015 1354   CALCIUM 8.2* 05/30/2015 0525   ALKPHOS 169* 08/25/2015 1354   ALKPHOS 88 05/30/2015 0525   AST 41* 08/25/2015 1354   AST 193* 05/30/2015 0525   ALT 32 08/25/2015 1354   ALT 76* 05/30/2015 0525   BILITOT 0.61 08/25/2015 1354   BILITOT 1.0 05/30/2015 0525      STUDIES: Ct Chest W Contrast  07/27/2015   CLINICAL DATA:  Recurrent left breast cancer status post bilateral mastectomy, chemotherapy in progress  EXAM: CT CHEST, ABDOMEN, AND PELVIS WITH CONTRAST  TECHNIQUE: Multidetector CT  imaging of the chest, abdomen and pelvis was performed following the standard protocol during bolus administration of intravenous contrast.  CONTRAST:  134m OMNIPAQUE IOHEXOL 300 MG/ML  SOLN  COMPARISON:  CTA chest dated 05/29/2015. CT chest abdomen pelvis dated 05/22/2015.  FINDINGS:  CT CHEST FINDINGS  Mediastinum/Nodes: Heart is normal in size. No pericardial effusion.  Aberrant right subclavian artery. Right chest port terminates in the upper right atrium.  Necrotic thoracic nodal metastases are mildly improved, including:  --2.5 x 2.8 cm left supraclavicular node (series 2/ image 51), previously 2.6 x 3.4 cm  --1.8 x 2.1 cm high right paratracheal node (series 2/image 12), previously 2.5 x 2.6 cm  --2.3 x 2.7 cm left prevascular node (series 2/image 16), previously 3.8 x 4.3 cm  Additional small calcified mediastinal nodes, benign. Status post left axillary lymph node dissection.  Visualized thyroid is unremarkable.  Lungs/Pleura: 1.8 x 2.3 cm necrotic mass along the anterior right middle lobe (series 2/image 30), previously 3.3 cm. Associated trace right pleural effusion.  7.8 x 4.0 cm mass along the anterior left upper lobe/chest wall (series 2/ image 18), previously 8.2 x 5.6 cm. Associated small left pleural effusion, some of which is loculated anteriorly along the left chest wall mass.  Again noted is interlobular septal thickening with scattered nodularity, worrisome for lymphangitic spread of tumor. Discrete pulmonary nodularity is less evident on the current study and favored to be improved. A 6 mm pulmonary nodules present in the left lung apex (series 4/image 11) previously 7 mm.  No pneumothorax.  Musculoskeletal: Status post bilateral mastectomy with reconstruction.  Mild degenerative changes of the thoracic spine. No focal osseous lesions.  CT ABDOMEN PELVIS FINDINGS  Hepatobiliary: Vague, heterogeneously enhancing lesion in the anterior right hepatic dome is difficult to discretely measure on the  current study but measures approximately 5.1 x 5.6 cm, previously 6.9 x 7.6 cm.  2.5 x 1.8 cm hypoenhancing lesion in the caudate, previously 2.7 x 2.2 cm.  Additional areas of heterogeneous perfusion.  Gallbladder is decompressed. No intrahepatic or extrahepatic ductal dilatation.  Pancreas: Pancreas is notable for a 2.9 x 2.9 cm hypoenhancing mass in the pancreatic head (series 2/ image 58), previously 4.3 x 3.2 cm. Associated atrophy the pancreatic body/ tail.  Spleen: Within normal limits.  Adrenals/Urinary Tract: Adrenal glands are within normal limits.  Kidneys are within normal limits.  No hydronephrosis.  Bladder is mildly thick-walled although underdistended.  Stomach/Bowel: Stomach is within normal limits.  No evidence of bowel obstruction.  Normal appendix.  Vascular/Lymphatic: No evidence of abdominal aortic aneurysm.  Circumaortic left renal vein.  Previously described portacaval lymphadenopathy is difficult to distinguish from the adjacent liver on the current study. Otherwise, no suspicious abdominopelvic lymphadenopathy.  Reproductive: Status post hysterectomy.  No adnexal mass.  Other: No abdominopelvic ascites.  Musculoskeletal: Visualized osseous structures are within normal limits.  IMPRESSION: Status post bilateral mastectomy with left axillary lymph node dissection.  Necrotic thoracic lymphadenopathy, mildly improved.  Pleural/pulmonary metastases with lymphangitic spread, as described above, improved.  Hepatic and pancreatic metastases, improved.   Electronically Signed   By: Julian Hy M.D.   On: 07/27/2015 15:35   Ct Abdomen Pelvis W Contrast  07/27/2015   CLINICAL DATA:  Recurrent left breast cancer status post bilateral mastectomy, chemotherapy in progress  EXAM: CT CHEST, ABDOMEN, AND PELVIS WITH CONTRAST  TECHNIQUE: Multidetector CT imaging of the chest, abdomen and pelvis was performed following the standard protocol during bolus administration of intravenous contrast.   CONTRAST:  157m OMNIPAQUE IOHEXOL 300 MG/ML  SOLN  COMPARISON:  CTA chest dated 05/29/2015. CT chest abdomen pelvis dated 05/22/2015.  FINDINGS: CT CHEST FINDINGS  Mediastinum/Nodes: Heart is normal in size. No pericardial effusion.  Aberrant right subclavian artery. Right chest port  terminates in the upper right atrium.  Necrotic thoracic nodal metastases are mildly improved, including:  --2.5 x 2.8 cm left supraclavicular node (series 2/ image 51), previously 2.6 x 3.4 cm  --1.8 x 2.1 cm high right paratracheal node (series 2/image 12), previously 2.5 x 2.6 cm  --2.3 x 2.7 cm left prevascular node (series 2/image 16), previously 3.8 x 4.3 cm  Additional small calcified mediastinal nodes, benign. Status post left axillary lymph node dissection.  Visualized thyroid is unremarkable.  Lungs/Pleura: 1.8 x 2.3 cm necrotic mass along the anterior right middle lobe (series 2/image 30), previously 3.3 cm. Associated trace right pleural effusion.  7.8 x 4.0 cm mass along the anterior left upper lobe/chest wall (series 2/ image 18), previously 8.2 x 5.6 cm. Associated small left pleural effusion, some of which is loculated anteriorly along the left chest wall mass.  Again noted is interlobular septal thickening with scattered nodularity, worrisome for lymphangitic spread of tumor. Discrete pulmonary nodularity is less evident on the current study and favored to be improved. A 6 mm pulmonary nodules present in the left lung apex (series 4/image 11) previously 7 mm.  No pneumothorax.  Musculoskeletal: Status post bilateral mastectomy with reconstruction.  Mild degenerative changes of the thoracic spine. No focal osseous lesions.  CT ABDOMEN PELVIS FINDINGS  Hepatobiliary: Vague, heterogeneously enhancing lesion in the anterior right hepatic dome is difficult to discretely measure on the current study but measures approximately 5.1 x 5.6 cm, previously 6.9 x 7.6 cm.  2.5 x 1.8 cm hypoenhancing lesion in the caudate, previously  2.7 x 2.2 cm.  Additional areas of heterogeneous perfusion.  Gallbladder is decompressed. No intrahepatic or extrahepatic ductal dilatation.  Pancreas: Pancreas is notable for a 2.9 x 2.9 cm hypoenhancing mass in the pancreatic head (series 2/ image 58), previously 4.3 x 3.2 cm. Associated atrophy the pancreatic body/ tail.  Spleen: Within normal limits.  Adrenals/Urinary Tract: Adrenal glands are within normal limits.  Kidneys are within normal limits.  No hydronephrosis.  Bladder is mildly thick-walled although underdistended.  Stomach/Bowel: Stomach is within normal limits.  No evidence of bowel obstruction.  Normal appendix.  Vascular/Lymphatic: No evidence of abdominal aortic aneurysm.  Circumaortic left renal vein.  Previously described portacaval lymphadenopathy is difficult to distinguish from the adjacent liver on the current study. Otherwise, no suspicious abdominopelvic lymphadenopathy.  Reproductive: Status post hysterectomy.  No adnexal mass.  Other: No abdominopelvic ascites.  Musculoskeletal: Visualized osseous structures are within normal limits.  IMPRESSION: Status post bilateral mastectomy with left axillary lymph node dissection.  Necrotic thoracic lymphadenopathy, mildly improved.  Pleural/pulmonary metastases with lymphangitic spread, as described above, improved.  Hepatic and pancreatic metastases, improved.   Electronically Signed   By: Julian Hy M.D.   On: 07/27/2015 15:35     ASSESSMENT: 49 y.o. Kristin Hoffman woman  (1)  status post left lumpectomy under the care of Dr. 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 2 of 23 lymph nodes were involved.  Margins were clear.  (2) status post adjuvant chemotherapy consisting of 6 q. three-week doses of docetaxel/ doxorubicin/ cyclophosphamide given between January 2007 and may of 2007, with last dose on 04/20/2006.  (3) status post  radiation therapy to the left breast, completed 07/11/2006  (4) began on tamoxifen in early August 2007 and continued until October 2009. She status post hysterectomy with bilateral salpingo-oophorectomy on 07/15/2008, and was started on letrozole  in October of 2009.  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 PET and CT of the chest showed no evidence of metastasis in January 2011.  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 Margins were clear. Patient underwent concurrent left latissimus flap reconstruction and right implant reconstruction.  (7)  status post additional chemotherapy with one cycle of carboplatin/gemcitabine given on 03/11/2010. She then received 5 q. three-week cycles of IV CMF between 03/25/2010 and 06/17/2010.  (8) started on exemestane in January 2012 and continued until late November 2013 when she was hospitalized for apparent TIA at which time her exemestane was stopped and not resumed  (9) METASTATIC DISEASE: evidence of disease recurence noted on chest CT, liver MRI and PET scan in December 2014, with suspicious lung nodules, lymphadenopathy, and 3 lesions in the right lobe of the liver, but no evidence of bony disease and no evidence of brain involvement by brain MRI September 2014. Biopsy of a left supraclavicular lymph node on 12/11/2013 confirmed metastatic carcinoma, estrogen receptor 45% positive, progesterone receptor 17% positive, with no HER-2 amplification.  (10) fulvestrant started 12/03/2013, discontinued 03/25/2014 with progression  (11) exemestane/ everolimus started 04/04/2014; everolimus held 04/14/2014, with skin rash and mouth  soreness; resumed at 5 mg a day as of 04/29/2014, discontinued 05/09/2014 with similar symptoms  (12) continueing exemestane, adding palbociclib 05/26/2014;   (a) palbociclib dose dropped to 75 mg every other day for 21 days beginning with cycle 4  (b) exemestane and Palbociclib discontinued December 2015 with evidence of progression  (13) [facility] referral placed 04/28/2049 -- re-sent 05/23/2014-- shows Pi3KCA and TP53 mutations  (a) [facility] one test requested 12/10/2014-- confirms [employer] results  (b) did not qualify for capecitabine/ BYL 719 trial because of elevated lipase  (14) started eribulin 12/26/2014, 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   (a) dpse dropped from 2g BID to 1.5g BID starting on 04/26/15  (b) stopped 05/17/2015 (after 4 cycles) with progression  (16) started carboplatin/ gemcitabine 05/26/2015, to be given day one and day 8 of each 21 day cycle  (a) given her history of severe neutropenia with this chemotherapy we'll do Neupogen days 2, 3 and 4 of each cycle and onpro day 9  (b) carbo dose dropped by 25% w cycle 2 due to very low platelet nadir  (c) CT scans after 3 cycles (07/27/2015) show measurable response  (17) Left pleural effusion\   (a) s/p L thoracentesis 05/22/2015, 05/29/2015 and 06/05/2015  (b) attempt at pleurx 06/12/2015 cancelled as effusion loculated and scant  (18) symptomatic anemia  (a) Aranesp started 07/28/2015  (b) Feraheme to be given 08/04/2015   PLAN:  Kristin Hoffman is doing well today, despite the fatigue. The labs were reviewed in detail and were stable. She will proceed with day 8, cycle 5 of carboplatin and gemcitabine as planned today. Tomorrow she will receive neulasta via her onpro device. On Monday she will be checked to see if she needs blood products.  Kristin Hoffman  will return in 2 weeks for cycle 6 of treatment. She understands and agrees  with this plan. She knows the goal of treatment in her case is control. She has been encouraged to call with any issues that might arise before her next visit here.   Kristin Panda, NP   08/25/2015 2:51 PM

## 2015-08-25 NOTE — Patient Instructions (Signed)
[facility] Discharge Instructions for Patients Receiving Chemotherapy  Today you received the following chemotherapy agents carboplatin/gemzar  To help prevent nausea and vomiting after your treatment, we encourage you to take your nausea medication as directed   If you develop nausea and vomiting that is not controlled by your nausea medication, call the clinic.   BELOW ARE SYMPTOMS THAT SHOULD BE REPORTED IMMEDIATELY:  *FEVER GREATER THAN 100.5 F  *CHILLS WITH OR WITHOUT FEVER  NAUSEA AND VOMITING THAT IS NOT CONTROLLED WITH YOUR NAUSEA MEDICATION  *UNUSUAL SHORTNESS OF BREATH  *UNUSUAL BRUISING OR BLEEDING  TENDERNESS IN MOUTH AND THROAT WITH OR WITHOUT PRESENCE OF ULCERS  *URINARY PROBLEMS  *BOWEL PROBLEMS  UNUSUAL RASH Items with * indicate a potential emergency and should be followed up as soon as possible.  Feel free to call the clinic you have any questions or concerns. The clinic phone number is [phone].

## 2015-08-31 ENCOUNTER — Other Ambulatory Visit: Payer: Self-pay

## 2015-08-31 ENCOUNTER — Other Ambulatory Visit (HOSPITAL_BASED_OUTPATIENT_CLINIC_OR_DEPARTMENT_OTHER): Payer: 59

## 2015-08-31 ENCOUNTER — Ambulatory Visit (HOSPITAL_BASED_OUTPATIENT_CLINIC_OR_DEPARTMENT_OTHER): Payer: 59

## 2015-08-31 ENCOUNTER — Other Ambulatory Visit: Payer: Self-pay | Admitting: Nurse Practitioner

## 2015-08-31 VITALS — BP 121/72 | HR 100 | Temp 98.7°F | Resp 19

## 2015-08-31 DIAGNOSIS — C50919 Malignant neoplasm of unspecified site of unspecified female breast: Secondary | ICD-10-CM

## 2015-08-31 DIAGNOSIS — D6481 Anemia due to antineoplastic chemotherapy: Secondary | ICD-10-CM

## 2015-08-31 DIAGNOSIS — T451X5A Adverse effect of antineoplastic and immunosuppressive drugs, initial encounter: Principal | ICD-10-CM

## 2015-08-31 DIAGNOSIS — C50312 Malignant neoplasm of lower-inner quadrant of left female breast: Secondary | ICD-10-CM

## 2015-08-31 LAB — CBC WITH DIFFERENTIAL/PLATELET
BASO%: 0.4 % (ref 0.0–2.0)
Basophils Absolute: 0.1 10*3/uL (ref 0.0–0.1)
EOS ABS: 0 10*3/uL (ref 0.0–0.5)
EOS%: 0 % (ref 0.0–7.0)
HEMATOCRIT: 19.9 % — AB (ref 34.8–46.6)
HGB: 6.3 g/dL — CL (ref 11.6–15.9)
LYMPH#: 0.6 10*3/uL — AB (ref 0.9–3.3)
LYMPH%: 4.5 % — ABNORMAL LOW (ref 14.0–49.7)
MCH: 29.9 pg (ref 25.1–34.0)
MCHC: 31.8 g/dL (ref 31.5–36.0)
MCV: 93.9 fL (ref 79.5–101.0)
MONO#: 0.1 10*3/uL (ref 0.1–0.9)
MONO%: 0.5 % (ref 0.0–14.0)
NEUT%: 94.6 % — AB (ref 38.4–76.8)
NEUTROS ABS: 11.8 10*3/uL — AB (ref 1.5–6.5)
PLATELETS: 14 10*3/uL — AB (ref 145–400)
RBC: 2.12 10*6/uL — ABNORMAL LOW (ref 3.70–5.45)
RDW: 22.8 % — ABNORMAL HIGH (ref 11.2–14.5)
WBC: 12.5 10*3/uL — ABNORMAL HIGH (ref 3.9–10.3)

## 2015-08-31 LAB — COMPREHENSIVE METABOLIC PANEL (CC13)
ALK PHOS: 175 U/L — AB (ref 40–150)
ALT: 32 U/L (ref 0–55)
ANION GAP: 5 meq/L (ref 3–11)
AST: 33 U/L (ref 5–34)
Albumin: 2.3 g/dL — ABNORMAL LOW (ref 3.5–5.0)
BILIRUBIN TOTAL: 0.8 mg/dL (ref 0.20–1.20)
BUN: 17 mg/dL (ref 7.0–26.0)
CALCIUM: 8.2 mg/dL — AB (ref 8.4–10.4)
CO2: 29 mEq/L (ref 22–29)
CREATININE: 0.6 mg/dL (ref 0.6–1.1)
Chloride: 108 mEq/L (ref 98–109)
EGFR: >90 mL/min/{1.73_m2} (ref >90)
Glucose: 119 mg/dl (ref 70–140)
Potassium: 3.8 mEq/L (ref 3.5–5.1)
Sodium: 142 mEq/L (ref 136–145)
TOTAL PROTEIN: 4.4 g/dL — AB (ref 6.4–8.3)

## 2015-08-31 LAB — PREPARE RBC (CROSSMATCH)

## 2015-08-31 LAB — HOLD TUBE, BLOOD BANK

## 2015-08-31 MED ORDER — HEPARIN SOD (PORK) LOCK FLUSH 100 UNIT/ML IV SOLN
500.0000 [IU] | Freq: Every day | INTRAVENOUS | Status: AC | PRN
  Administered 2015-08-31: 500 [IU]
  Filled 2015-08-31: qty 5

## 2015-08-31 MED ORDER — ACETAMINOPHEN 325 MG PO TABS
650.0000 mg | ORAL_TABLET | Freq: Once | ORAL | Status: AC
  Administered 2015-08-31: 650 mg via ORAL

## 2015-08-31 MED ORDER — DIPHENHYDRAMINE HCL 25 MG PO CAPS
25.0000 mg | ORAL_CAPSULE | Freq: Once | ORAL | Status: AC
  Administered 2015-08-31: 25 mg via ORAL

## 2015-08-31 MED ORDER — ACETAMINOPHEN 325 MG PO TABS
ORAL_TABLET | ORAL | Status: AC
Start: 2015-08-31 — End: 2015-08-31
  Filled 2015-08-31: qty 2

## 2015-08-31 MED ORDER — SODIUM CHLORIDE 0.9 % IJ SOLN
10.0000 mL | INTRAMUSCULAR | Status: AC | PRN
  Administered 2015-08-31: 10 mL
  Filled 2015-08-31: qty 10

## 2015-08-31 MED ORDER — DIPHENHYDRAMINE HCL 25 MG PO CAPS
ORAL_CAPSULE | ORAL | Status: AC
  Filled 2015-08-31: qty 1

## 2015-08-31 MED ORDER — SODIUM CHLORIDE 0.9 % IV SOLN
250.0000 mL | Freq: Once | INTRAVENOUS | Status: AC
  Administered 2015-08-31: 250 mL via INTRAVENOUS

## 2015-08-31 NOTE — Patient Instructions (Signed)

## 2015-08-31 NOTE — Progress Notes (Signed)
[name] notified that patient's hgb is 6.3 today.

## 2015-09-01 LAB — TYPE AND SCREEN
ABO/RH(D): A POS
Antibody Screen: NEGATIVE
UNIT DIVISION: 0
UNIT DIVISION: 0

## 2015-09-02 ENCOUNTER — Telehealth: Payer: Self-pay | Admitting: Nurse Practitioner

## 2015-09-02 NOTE — Telephone Encounter (Signed)
Left message to confirm appointment for 09/22 labs & sickle cell for platelets

## 2015-09-03 ENCOUNTER — Other Ambulatory Visit: Payer: Self-pay

## 2015-09-03 ENCOUNTER — Other Ambulatory Visit (HOSPITAL_BASED_OUTPATIENT_CLINIC_OR_DEPARTMENT_OTHER): Payer: 59

## 2015-09-03 ENCOUNTER — Ambulatory Visit (HOSPITAL_COMMUNITY)
Admission: RE | Admit: 2015-09-03 | Discharge: 2015-09-03 | Disposition: A | Discharge status: Home / Self Care | Payer: 59 | Source: Ambulatory Visit | Attending: Oncology | Admitting: Oncology

## 2015-09-03 ENCOUNTER — Telehealth: Payer: Self-pay

## 2015-09-03 VITALS — BP 117/74 | HR 100 | Temp 98.7°F | Resp 20

## 2015-09-03 DIAGNOSIS — C50919 Malignant neoplasm of unspecified site of unspecified female breast: Secondary | ICD-10-CM

## 2015-09-03 DIAGNOSIS — C50312 Malignant neoplasm of lower-inner quadrant of left female breast: Secondary | ICD-10-CM

## 2015-09-03 DIAGNOSIS — D6481 Anemia due to antineoplastic chemotherapy: Secondary | ICD-10-CM | POA: Diagnosis not present

## 2015-09-03 LAB — CBC WITH DIFFERENTIAL/PLATELET
BASO%: 0.1 % (ref 0.0–2.0)
BASOS ABS: 0 10*3/uL (ref 0.0–0.1)
EOS%: 0.1 % (ref 0.0–7.0)
Eosinophils Absolute: 0 10*3/uL (ref 0.0–0.5)
HEMATOCRIT: 26.5 % — AB (ref 34.8–46.6)
HEMOGLOBIN: 8.5 g/dL — AB (ref 11.6–15.9)
LYMPH#: 0.7 10*3/uL — AB (ref 0.9–3.3)
LYMPH%: 8 % — AB (ref 14.0–49.7)
MCH: 28.9 pg (ref 25.1–34.0)
MCHC: 32.1 g/dL (ref 31.5–36.0)
MCV: 90.1 fL (ref 79.5–101.0)
MONO#: 0.7 10*3/uL (ref 0.1–0.9)
MONO%: 7.6 % (ref 0.0–14.0)
NEUT#: 7.7 10*3/uL — ABNORMAL HIGH (ref 1.5–6.5)
NEUT%: 84.2 % — AB (ref 38.4–76.8)
Platelets: 5 10*3/uL — CL (ref 145–400)
RBC: 2.94 10*6/uL — ABNORMAL LOW (ref 3.70–5.45)
RDW: 19.6 % — ABNORMAL HIGH (ref 11.2–14.5)
WBC: 9.1 10*3/uL (ref 3.9–10.3)
nRBC: 0 % (ref 0–0)

## 2015-09-03 LAB — COMPREHENSIVE METABOLIC PANEL (CC13)
ALBUMIN: 2.6 g/dL — AB (ref 3.5–5.0)
ALK PHOS: 195 U/L — AB (ref 40–150)
ALT: 34 U/L (ref 0–55)
ANION GAP: 6 meq/L (ref 3–11)
AST: 40 U/L — ABNORMAL HIGH (ref 5–34)
BUN: 11.1 mg/dL (ref 7.0–26.0)
CALCIUM: 8.7 mg/dL (ref 8.4–10.4)
CHLORIDE: 107 meq/L (ref 98–109)
CO2: 31 mEq/L — ABNORMAL HIGH (ref 22–29)
Creatinine: 0.6 mg/dL (ref 0.6–1.1)
Glucose: 103 mg/dl (ref 70–140)
POTASSIUM: 3.9 meq/L (ref 3.5–5.1)
Sodium: 143 mEq/L (ref 136–145)
Total Bilirubin: 1.27 mg/dL — ABNORMAL HIGH (ref 0.20–1.20)
Total Protein: 4.8 g/dL — ABNORMAL LOW (ref 6.4–8.3)

## 2015-09-03 MED ORDER — HEPARIN SOD (PORK) LOCK FLUSH 100 UNIT/ML IV SOLN
500.0000 [IU] | Freq: Every day | INTRAVENOUS | Status: AC | PRN
  Administered 2015-09-03: 500 [IU]
  Filled 2015-09-03: qty 5

## 2015-09-03 MED ORDER — SODIUM CHLORIDE 0.9 % IJ SOLN
10.0000 mL | INTRAMUSCULAR | Status: AC | PRN
  Administered 2015-09-03: 10 mL

## 2015-09-03 MED ORDER — ACETAMINOPHEN 325 MG PO TABS
650.0000 mg | ORAL_TABLET | Freq: Once | ORAL | Status: AC
  Administered 2015-09-03: 650 mg via ORAL
  Filled 2015-09-03: qty 2

## 2015-09-03 MED ORDER — DIPHENHYDRAMINE HCL 25 MG PO CAPS
25.0000 mg | ORAL_CAPSULE | Freq: Once | ORAL | Status: AC
  Administered 2015-09-03: 25 mg via ORAL
  Filled 2015-09-03: qty 1

## 2015-09-03 MED ORDER — SODIUM CHLORIDE 0.9 % IV SOLN
250.0000 mL | Freq: Once | INTRAVENOUS | Status: AC
  Administered 2015-09-03: 250 mL via INTRAVENOUS

## 2015-09-03 NOTE — Procedures (Signed)
Associated diagnosis: Anemia due to chemotherapy E. 933.1 MD: Magrinat  Procedure Note: Infusion 1 unit of platelets, Port accessed, blood return noted, and de accessed per protocol Condition during procedure: Tolerated well Condition after procedure: Alert, oriented, and ambulatory

## 2015-09-03 NOTE — Telephone Encounter (Signed)
error 

## 2015-09-03 NOTE — Progress Notes (Signed)
Received a critical from lab- platelet of 5 today.  Patient is to have a platelet infusion in the sickle cell infusion area today.  Orders entered and blood bank called.

## 2015-09-04 LAB — PREPARE PLATELET PHERESIS: UNIT DIVISION: 0

## 2015-09-08 ENCOUNTER — Ambulatory Visit (HOSPITAL_BASED_OUTPATIENT_CLINIC_OR_DEPARTMENT_OTHER): Payer: 59 | Admitting: Nurse Practitioner

## 2015-09-08 ENCOUNTER — Ambulatory Visit (HOSPITAL_BASED_OUTPATIENT_CLINIC_OR_DEPARTMENT_OTHER): Payer: 59

## 2015-09-08 ENCOUNTER — Encounter: Payer: Self-pay | Admitting: Nurse Practitioner

## 2015-09-08 ENCOUNTER — Other Ambulatory Visit: Payer: Self-pay | Admitting: Oncology

## 2015-09-08 ENCOUNTER — Other Ambulatory Visit (HOSPITAL_BASED_OUTPATIENT_CLINIC_OR_DEPARTMENT_OTHER): Payer: 59

## 2015-09-08 VITALS — BP 135/79 | HR 115 | Temp 98.7°F | Resp 20 | Ht 63.0 in | Wt 175.5 lb

## 2015-09-08 DIAGNOSIS — C50312 Malignant neoplasm of lower-inner quadrant of left female breast: Secondary | ICD-10-CM

## 2015-09-08 DIAGNOSIS — C770 Secondary and unspecified malignant neoplasm of lymph nodes of head, face and neck: Secondary | ICD-10-CM | POA: Diagnosis not present

## 2015-09-08 DIAGNOSIS — C50919 Malignant neoplasm of unspecified site of unspecified female breast: Secondary | ICD-10-CM

## 2015-09-08 DIAGNOSIS — R0902 Hypoxemia: Secondary | ICD-10-CM

## 2015-09-08 DIAGNOSIS — D649 Anemia, unspecified: Secondary | ICD-10-CM

## 2015-09-08 DIAGNOSIS — Z8673 Personal history of transient ischemic attack (TIA), and cerebral infarction without residual deficits: Secondary | ICD-10-CM

## 2015-09-08 DIAGNOSIS — R9431 Abnormal electrocardiogram [ECG] [EKG]: Secondary | ICD-10-CM

## 2015-09-08 DIAGNOSIS — R112 Nausea with vomiting, unspecified: Secondary | ICD-10-CM

## 2015-09-08 DIAGNOSIS — C787 Secondary malignant neoplasm of liver and intrahepatic bile duct: Secondary | ICD-10-CM

## 2015-09-08 DIAGNOSIS — R630 Anorexia: Secondary | ICD-10-CM

## 2015-09-08 DIAGNOSIS — Z5111 Encounter for antineoplastic chemotherapy: Secondary | ICD-10-CM

## 2015-09-08 DIAGNOSIS — R197 Diarrhea, unspecified: Secondary | ICD-10-CM

## 2015-09-08 DIAGNOSIS — K1231 Oral mucositis (ulcerative) due to antineoplastic therapy: Secondary | ICD-10-CM

## 2015-09-08 DIAGNOSIS — R530 Neoplastic (malignant) related fatigue: Secondary | ICD-10-CM

## 2015-09-08 DIAGNOSIS — F419 Anxiety disorder, unspecified: Secondary | ICD-10-CM | POA: Diagnosis not present

## 2015-09-08 DIAGNOSIS — I82812 Embolism and thrombosis of superficial veins of left lower extremities: Secondary | ICD-10-CM

## 2015-09-08 DIAGNOSIS — D696 Thrombocytopenia, unspecified: Secondary | ICD-10-CM

## 2015-09-08 DIAGNOSIS — E860 Dehydration: Secondary | ICD-10-CM

## 2015-09-08 DIAGNOSIS — D72829 Elevated white blood cell count, unspecified: Secondary | ICD-10-CM

## 2015-09-08 DIAGNOSIS — R12 Heartburn: Secondary | ICD-10-CM

## 2015-09-08 DIAGNOSIS — D6481 Anemia due to antineoplastic chemotherapy: Secondary | ICD-10-CM | POA: Diagnosis not present

## 2015-09-08 DIAGNOSIS — R0609 Other forms of dyspnea: Secondary | ICD-10-CM

## 2015-09-08 DIAGNOSIS — R51 Headache: Secondary | ICD-10-CM

## 2015-09-08 DIAGNOSIS — R519 Headache, unspecified: Secondary | ICD-10-CM

## 2015-09-08 DIAGNOSIS — R0602 Shortness of breath: Secondary | ICD-10-CM

## 2015-09-08 DIAGNOSIS — M62830 Muscle spasm of back: Secondary | ICD-10-CM

## 2015-09-08 DIAGNOSIS — J90 Pleural effusion, not elsewhere classified: Secondary | ICD-10-CM

## 2015-09-08 DIAGNOSIS — D630 Anemia in neoplastic disease: Secondary | ICD-10-CM

## 2015-09-08 DIAGNOSIS — L271 Localized skin eruption due to drugs and medicaments taken internally: Secondary | ICD-10-CM

## 2015-09-08 LAB — CBC WITH DIFFERENTIAL/PLATELET
BASO%: 0.4 % (ref 0.0–2.0)
Basophils Absolute: 0 10*3/uL (ref 0.0–0.1)
EOS ABS: 0 10*3/uL (ref 0.0–0.5)
EOS%: 0.4 % (ref 0.0–7.0)
HCT: 28.4 % — ABNORMAL LOW (ref 34.8–46.6)
HGB: 8.7 g/dL — ABNORMAL LOW (ref 11.6–15.9)
LYMPH%: 8.5 % — AB (ref 14.0–49.7)
MCH: 29.3 pg (ref 25.1–34.0)
MCHC: 30.6 g/dL — AB (ref 31.5–36.0)
MCV: 95.6 fL (ref 79.5–101.0)
MONO#: 1.4 10*3/uL — ABNORMAL HIGH (ref 0.1–0.9)
MONO%: 12.5 % (ref 0.0–14.0)
NEUT%: 78.2 % — ABNORMAL HIGH (ref 38.4–76.8)
NEUTROS ABS: 8.6 10*3/uL — AB (ref 1.5–6.5)
PLATELETS: 120 10*3/uL — AB (ref 145–400)
RBC: 2.97 10*6/uL — AB (ref 3.70–5.45)
RDW: 24.2 % — ABNORMAL HIGH (ref 11.2–14.5)
WBC: 11 10*3/uL — AB (ref 3.9–10.3)
lymph#: 0.9 10*3/uL (ref 0.9–3.3)
nRBC: 3 % — ABNORMAL HIGH (ref 0–0)

## 2015-09-08 LAB — COMPREHENSIVE METABOLIC PANEL (CC13)
ALT: 36 U/L (ref 0–55)
ANION GAP: 8 meq/L (ref 3–11)
AST: 47 U/L — ABNORMAL HIGH (ref 5–34)
Albumin: 2.9 g/dL — ABNORMAL LOW (ref 3.5–5.0)
Alkaline Phosphatase: 201 U/L — ABNORMAL HIGH (ref 40–150)
BUN: 8.4 mg/dL (ref 7.0–26.0)
CHLORIDE: 107 meq/L (ref 98–109)
CO2: 29 meq/L (ref 22–29)
Calcium: 8.7 mg/dL (ref 8.4–10.4)
Creatinine: 0.7 mg/dL (ref 0.6–1.1)
Glucose: 96 mg/dl (ref 70–140)
Potassium: 3.6 mEq/L (ref 3.5–5.1)
SODIUM: 144 meq/L (ref 136–145)
Total Bilirubin: 0.81 mg/dL (ref 0.20–1.20)
Total Protein: 5.4 g/dL — ABNORMAL LOW (ref 6.4–8.3)

## 2015-09-08 MED ORDER — SODIUM CHLORIDE 0.9 % IV SOLN
200.0000 mg | Freq: Once | INTRAVENOUS | Status: AC
  Administered 2015-09-08: 200 mg via INTRAVENOUS
  Filled 2015-09-08: qty 20

## 2015-09-08 MED ORDER — DARBEPOETIN ALFA 500 MCG/ML IJ SOSY
500.0000 ug | PREFILLED_SYRINGE | Freq: Once | INTRAMUSCULAR | Status: AC
  Administered 2015-09-08: 500 ug via SUBCUTANEOUS
  Filled 2015-09-08: qty 1

## 2015-09-08 MED ORDER — SODIUM CHLORIDE 0.9 % IJ SOLN
10.0000 mL | INTRAMUSCULAR | Status: DC | PRN
  Administered 2015-09-08: 10 mL
  Filled 2015-09-08: qty 10

## 2015-09-08 MED ORDER — SODIUM CHLORIDE 0.9 % IV SOLN
Freq: Once | INTRAVENOUS | Status: AC
  Administered 2015-09-08: 12:00:00 via INTRAVENOUS
  Filled 2015-09-08: qty 4

## 2015-09-08 MED ORDER — HEPARIN SOD (PORK) LOCK FLUSH 100 UNIT/ML IV SOLN
500.0000 [IU] | Freq: Once | INTRAVENOUS | Status: AC | PRN
  Administered 2015-09-08: 500 [IU]
  Filled 2015-09-08: qty 5

## 2015-09-08 MED ORDER — SODIUM CHLORIDE 0.9 % IV SOLN
800.0000 mg/m2 | Freq: Once | INTRAVENOUS | Status: AC
  Administered 2015-09-08: 1520 mg via INTRAVENOUS
  Filled 2015-09-08: qty 39.98

## 2015-09-08 MED ORDER — SODIUM CHLORIDE 0.9 % IV SOLN
Freq: Once | INTRAVENOUS | Status: AC
  Administered 2015-09-08: 12:00:00 via INTRAVENOUS

## 2015-09-08 NOTE — Progress Notes (Signed)
ID: Kristin Hoffman OB: [birth year]  MR#: [id number]  MAU#:[id number]  PCP: Drake Leach, MD GYN:  [name] SU: [name] OTHER MD: [name], [name]  CHIEF COMPLAINT:  Stage IV breast cancer  CURRENT TREATMENT: carboplatin/ gemcitabine/ aranesp  BREAST CANCER HISTORY: This patient was previously followed by [name], and was transferred to [name] service as of 08/20/2013.  At the age of 49, the patient had a screening mammogram as a baseline. She had recently given birth and was on birth control pills at that time. The mammogram in November 2006 showed an area of architectural distortion in the left lower inner quadrant. Subsequently an ultrasound was obtained of the left breast showing a 2.0 x 1.5 x 1.4 cm mass, at the 8:00 position, 4 cm from the nipple. A core biopsy performed on 10/21/2005 showed an invasive mammary carcinoma, ER +70%, PR +41%, HER-2/neu negative, with proliferation marker of 38%. [phone])  Breast MRI on 11/08/2005 confirmed a 2.5 cm spiculated mass in the left lower inner quadrant with 3 small satellite nodules adjacent to the primary mass: 3.5 cm posterior medial to the primary mass, measuring 9 mm; 1.5 cm anterior medial to the primary mass measuring 5 mm; and 2 cm medial to the primary mass measuring 5 mm. No axillary adenopathy was noted. No suspicious masses or enhancement are noted in the right breast.  Kristin Hoffman underwentt left lumpectomy under the care of Dr. 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 2 of 23 lymph nodes were involved.  Margins were clear.  She received adjuvant chemotherapy consisting of 6 q. three-week doses of docetaxel/doxorubicin/cyclophosphamide given between January 2007 and may of 2007, with last dose on 04/20/2006. She underwent radiation therapy completed 07/11/2006, after which she began on tamoxifen in  early August 2007.  She underwent hysterectomy with bilateral salpingo-oophorectomy on 07/15/2008, and was started on letrozole in October of 2009.  A mammogram 11/10/2009 showed microcalcifications in the left breast, and a subsequent biopsy on 11/18/2009 confirmed invasive mammary carcinoma in the left breast. PET and CT of the chest showed no evidence of metastasis in January 2011.  She underwent bilateral mastectomies 01/25/2010, with the right breast showing no evidence of malignancy; in the left breast showing a 1.0 cm grade 2 invasive ductal carcinoma with high-grade DCIS. Tumor was ER +22%, PR negative, HER-2/neu negative, with proliferation marker of 13%. Margins were clear. Patient underwent concurrent left latissimus flap reconstruction and right implant reconstruction.  She received additional chemotherapy with one cycle of carboplatin/gemcitabine given on 03/11/2010. She then received 5 q. three-week cycles of IV CMF between 03/25/2010 and 06/17/2010.  She was started on exemestane in January 2012 and continued until late November 2013 when she was hospitalized for apparent TIA.   Her subsequent history is as detailed below  INTERVAL HISTORY: Kristin Hoffman returns today for follow up of her metastatic breast cancer, alone. Today is day 1 cycle 6 of 6 planned cycles of carboplatin and gemcitabine given on days 1 and 8 of each cycle. She receives Neupogen on days 2,  3 and 4 of each cycle and then onpro on day 9. Carboplatin dose has been reduced to AUC 1.5 due to previous thrombocytopenia.  REVIEW OF SYSTEMS: Kristin Hoffman received both blood and platelets last week on separate days. In between the blood and the platelets she had some petechiae, but this resolved. She denies fevers, chills,  nausea, vomiting, or changes in bowel or bladder habits. She believes she is experiencing less shortness of breath. She is able to get more accomplished without having to sit down. She uses 2LO2 as needed at home. She  continues on tramadol and advil for her back pain.  She has noticed more anxiety about coming to the cancer center for treatment. She has restless legs at night. She denies mouth sores, rashes, or neuropathy symptoms. A detailed review of systems is otherwise stable.  PAST MEDICaL HISTORY: Past Medical History  Diagnosis Date  . Depression   . Reflux   . Hx-TIA (transient ischemic attack) 11/22/2013  . Chronic headaches 11/22/2013  . Anxiety 11/22/2013  . Breast cancer   . Cancer   . Breast cancer metastasized to multiple sites 12/24/2013    PAST SURGICAL HISTORY: Past Surgical History  Procedure Laterality Date  . Mastectomy w/ nodes partial    . Mastectomy    . Breast reconstruction    . Portacath placement      x 2  . Portacath removal      x 2  . Abdominal hysterectomy    . Tee without cardioversion  11/12/2012    Procedure: TRANSESOPHAGEAL ECHOCARDIOGRAM (TEE);  Surgeon: Candee Furbish, MD;  Location: [facility];  Service: Cardiovascular;  Laterality: N/A;  This TEE may be Dr. Marlou Porch or a LHC Dr    FAMILY HISTORY Both parents are alive and well. Patient has one sister who is 48 years younger and is in good health. No other history of breast or ovarian cancer in the family. Family History  Problem Relation Age of Onset  . Diabetes Mellitus II Neg Hx   . Hypertension Neg Hx     GYNECOLOGIC HISTORY:   (Updated January 2015) G2P2, menarche at age 32 with irregular menses. On birth control pills in the past. On Clomid to induce ovulation with first pregnancy. Also had preeclampsia with first pregnancy. Status post hysterectomy and bilateral salpingo-oophorectomy in August 2009.  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 She is originally from [place]. She's been married to [place], for 13 years. He works as an Pharmacist, hospital at [employer].   ADVANCED DIRECTIVES:  Not in place; Not  in place. At the clinic visit 09/08/2015 the patient was given the appropriate forms to complete and notarize at her discretion.    HEALTH MAINTENANCE:  (Updated 11/22/2013) Social History  Substance Use Topics  . Smoking status: Never Smoker   . Smokeless tobacco: Never Used  . Alcohol Use: Yes     Comment: rarely     Colonoscopy:  Never  PAP: S/P LAVH/BSO in August 2009  Bone density: Never  Lipid panel: Not on file   Allergies  Allergen Reactions  . Afinitor [Everolimus] Hives and Itching    Current Outpatient Prescriptions  Medication Sig Dispense Refill  . acidophilus (RISAQUAD) CAPS capsule Take 1 capsule by mouth every morning.     . Biotin 5000 MCG CAPS Take 5,000 mcg by mouth every morning.  30 capsule   . cetirizine (ZYRTEC) 10 MG tablet Take 10 mg by mouth at bedtime.     . Cholecalciferol 2000 UNITS CHEW Chew 1 tablet by mouth daily with breakfast.     . escitalopram (LEXAPRO) 20 MG tablet Take 1 tablet (20 mg total) by mouth daily. 30 tablet 12  . filgrastim (NEUPOGEN) 300 MCG/0.5ML SOSY  injection Inject 0.5 mLs (300 mcg total) into the skin once. 6 Syringe 1  . Ibuprofen (ADVIL) 200 MG CAPS Take 2 capsules (400 mg total) by mouth 3 (three) times daily with meals as needed. 120 each 0  . LORazepam (ATIVAN) 0.5 MG tablet Take 1 tablet (0.5 mg total) by mouth every 6 (six) hours as needed for anxiety. 120 tablet 1  . Melatonin 10 MG TABS Take 1 tablet by mouth at bedtime.    . montelukast (SINGULAIR) 10 MG tablet Take 10 mg by mouth daily with breakfast.     . Multiple Vitamin (MULTIVITAMIN) tablet Take 1 tablet by mouth every morning.     Marland Kitchen omeprazole (PRILOSEC) 40 MG capsule Take 40 mg by mouth every morning.     . ondansetron (ZOFRAN) 8 MG tablet Take 8 mg by mouth 2 (two) times daily. Starts day after chemo    . prochlorperazine (COMPAZINE) 10 MG tablet Take 10 mg by mouth every 6 (six) hours as needed for nausea or vomiting.     . traMADol (ULTRAM) 50 MG tablet  Take 1 tablet (50 mg total) by mouth every 6 (six) hours as needed. 120 tablet 1  . nystatin (MYCOSTATIN) 100000 UNIT/ML suspension Take 5 mLs (500,000 Units total) by mouth 4 (four) times daily. (Patient not taking: Reported on 07/13/2015) 240 mL 1   No current facility-administered medications for this visit.    OBJECTIVE: Kristin Hoffman who appears stated age 49 Vitals:   09/08/15 1038  BP: 135/79  Pulse: 115  Temp: 98.7 F (37.1 C)  Resp: 20  Body mass index is 31.1 kg/(m^2).  ECOG: 2-3 Filed Weights   09/08/15 1038  Weight: 175 lb 8 oz (79.606 kg)   Skin: warm, dry  HEENT: sclerae anicteric, conjunctivae pink, oropharynx clear. No thrush or mucositis.  Lymph Nodes: No cervical or supraclavicular lymphadenopathy  Lungs: clear to auscultation bilaterally, no rales, wheezes, or rhonci  Heart: regular rate and rhythm  Abdomen: round, soft, non tender, positive bowel sounds  Musculoskeletal: No focal spinal tenderness, no peripheral edema  Neuro: non focal, well oriented, positive affect Breasts: deferred  LAB RESULTS:   Lab Results  Component Value Date   WBC 11.0* 09/08/2015   NEUTROABS 8.6* 09/08/2015   HGB 8.7* 09/08/2015   HCT 28.4* 09/08/2015   MCV 95.6 09/08/2015   PLT 120* 09/08/2015      Chemistry      Component Value Date/Time   NA 144 09/08/2015 1023   NA 138 05/30/2015 0525   K 3.6 09/08/2015 1023   K 3.7 05/30/2015 0525   CL 104 05/30/2015 0525   CL 104 11/14/2012 1407   CO2 29 09/08/2015 1023   CO2 27 05/30/2015 0525   BUN 8.4 09/08/2015 1023   BUN 16 05/30/2015 0525   CREATININE 0.7 09/08/2015 1023   CREATININE 0.68 05/30/2015 0525      Component Value Date/Time   CALCIUM 8.7 09/08/2015 1023   CALCIUM 8.2* 05/30/2015 0525   ALKPHOS 201* 09/08/2015 1023   ALKPHOS 88 05/30/2015 0525   AST 47* 09/08/2015 1023   AST 193* 05/30/2015 0525   ALT 36 09/08/2015 1023   ALT 76* 05/30/2015 0525   BILITOT 0.81 09/08/2015 1023   BILITOT 1.0  05/30/2015 0525      STUDIES: No results found.   ASSESSMENT: 49 y.o. Kristin Hoffman  (1)  status post left lumpectomy under the care of Dr. [name] on 11/18/2005 for a 2.5 cm grade 3  invasive ductal carcinoma, ER +70%, PR +41%, HER-2/neu negative, with proliferation marker of 38%. 2 of 23 lymph nodes were involved.  Margins were clear.  (2) status post adjuvant chemotherapy consisting of 6 q. three-week doses of docetaxel/ doxorubicin/ cyclophosphamide given between January 2007 and may of 2007, with last dose on 04/20/2006.  (3) status post radiation therapy to the left breast, completed 07/11/2006  (4) began on tamoxifen in early August 2007 and continued until October 2009. She status post hysterectomy with bilateral salpingo-oophorectomy on 07/15/2008, and was started on letrozole in October of 2009.  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 PET and CT of the chest showed no evidence of metastasis in January 2011.  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 Margins were clear. Patient underwent concurrent left latissimus flap reconstruction and right implant reconstruction.  (7)  status post additional chemotherapy with one cycle of carboplatin/gemcitabine given on 03/11/2010. She then received 5 q. three-week cycles of IV CMF between 03/25/2010 and 06/17/2010.  (8) started on exemestane in January 2012 and continued until late November 2013 when she was hospitalized for apparent TIA at which time her exemestane was stopped and not resumed  (9) METASTATIC DISEASE: evidence of disease recurence noted on chest CT, liver MRI and PET scan in December 2014,  with suspicious lung nodules, lymphadenopathy, and 3 lesions in the right lobe of the liver, but no evidence of bony disease and no evidence of brain involvement by brain MRI September 2014. Biopsy of a left supraclavicular lymph node on 12/11/2013 confirmed metastatic carcinoma, estrogen receptor 45% positive, progesterone receptor 17% positive, with no HER-2 amplification.  (10) fulvestrant started 12/03/2013, discontinued 03/25/2014 with progression  (11) exemestane/ everolimus started 04/04/2014; everolimus held 04/14/2014, with skin rash and mouth soreness; resumed at 5 mg a day as of 04/29/2014, discontinued 05/09/2014 with similar symptoms  (12) continueing exemestane, adding palbociclib 05/26/2014;   (a) palbociclib dose dropped to 75 mg every other day for 21 days beginning with cycle 4  (b) exemestane and Palbociclib discontinued December 2015 with evidence of progression  (13) [facility] referral placed 04/28/2049 -- re-sent 05/23/2014-- shows Pi3KCA and TP53 mutations  (a) [facility] one test requested 12/10/2014-- confirms [employer] results  (b) did not qualify for capecitabine/ BYL 719 trial because of elevated lipase  (14) started eribulin 12/26/2014, 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   (a) dpse dropped from 2g BID to 1.5g BID starting on 04/26/15  (b) stopped 05/17/2015 (after 4 cycles) with progression  (16) started carboplatin/ gemcitabine 05/26/2015, to be given day one and day 8 of each 21 day cycle  (a) given her history of severe neutropenia with this chemotherapy we'll do Neupogen days 2, 3 and 4 of each cycle and onpro day 9  (b) carbo dose dropped by 25% w cycle 2 due to very low platelet nadir  (c) CT scans after 3 cycles (07/27/2015) show measurable response  (17) Left pleural  effusion\   (a) s/p L thoracentesis 05/22/2015, 05/29/2015 and 06/05/2015  (b) attempt at pleurx 06/12/2015 cancelled as  effusion loculated and scant  (18) symptomatic anemia  (a) Aranesp started 07/28/2015  (b) Feraheme to be given 08/04/2015   PLAN:  Kristin Hoffman is more anxious than usual, but is otherwise stable. The labs were reviewed in detail, and her platelets and hgb are back up to 120 and 8.7 respectively. She will proceed with day 1, cycle 6 of carboplatin and gemcitabine as planned today. She will receive neupogen for the following 3 days.  The patient would like her port accessed for labs on treatment days moving forward, to reduce her sticks and anxiety.   I have placed orders for a repeat CT of the chest, abdomen, and pelvis to be performed at the conclusion of this cycle. We are also adding brain CT to survey for brain lesions.  Kristin Hoffman will return in 1 week for day 8 of treatment. She understands and agrees with this plan. She knows the goal of treatment in her case is control. She has been encouraged to call with any issues that might arise before her next visit here.   Laurie Panda, NP   09/08/2015 11:30 AM

## 2015-09-09 ENCOUNTER — Ambulatory Visit (HOSPITAL_BASED_OUTPATIENT_CLINIC_OR_DEPARTMENT_OTHER): Payer: 59

## 2015-09-09 VITALS — BP 117/73 | HR 114 | Temp 98.9°F

## 2015-09-09 DIAGNOSIS — C50312 Malignant neoplasm of lower-inner quadrant of left female breast: Secondary | ICD-10-CM

## 2015-09-09 DIAGNOSIS — Z5189 Encounter for other specified aftercare: Secondary | ICD-10-CM | POA: Diagnosis not present

## 2015-09-09 DIAGNOSIS — C50919 Malignant neoplasm of unspecified site of unspecified female breast: Secondary | ICD-10-CM

## 2015-09-09 DIAGNOSIS — C787 Secondary malignant neoplasm of liver and intrahepatic bile duct: Secondary | ICD-10-CM | POA: Diagnosis not present

## 2015-09-09 MED ORDER — TBO-FILGRASTIM 300 MCG/0.5ML ~~LOC~~ SOSY
300.0000 ug | PREFILLED_SYRINGE | Freq: Once | SUBCUTANEOUS | Status: AC
  Administered 2015-09-09: 300 ug via SUBCUTANEOUS
  Filled 2015-09-09: qty 0.5

## 2015-09-10 ENCOUNTER — Ambulatory Visit (HOSPITAL_BASED_OUTPATIENT_CLINIC_OR_DEPARTMENT_OTHER): Payer: 59

## 2015-09-10 VITALS — BP 118/79 | HR 116 | Temp 98.9°F

## 2015-09-10 DIAGNOSIS — C50312 Malignant neoplasm of lower-inner quadrant of left female breast: Secondary | ICD-10-CM

## 2015-09-10 DIAGNOSIS — C787 Secondary malignant neoplasm of liver and intrahepatic bile duct: Secondary | ICD-10-CM | POA: Diagnosis not present

## 2015-09-10 DIAGNOSIS — Z5189 Encounter for other specified aftercare: Secondary | ICD-10-CM

## 2015-09-10 DIAGNOSIS — C50919 Malignant neoplasm of unspecified site of unspecified female breast: Secondary | ICD-10-CM

## 2015-09-10 MED ORDER — TBO-FILGRASTIM 300 MCG/0.5ML ~~LOC~~ SOSY
300.0000 ug | PREFILLED_SYRINGE | Freq: Once | SUBCUTANEOUS | Status: AC
  Administered 2015-09-10: 300 ug via SUBCUTANEOUS
  Filled 2015-09-10: qty 0.5

## 2015-09-11 ENCOUNTER — Ambulatory Visit (HOSPITAL_BASED_OUTPATIENT_CLINIC_OR_DEPARTMENT_OTHER): Payer: 59

## 2015-09-11 VITALS — BP 130/71 | HR 120 | Temp 98.4°F

## 2015-09-11 DIAGNOSIS — Z5189 Encounter for other specified aftercare: Secondary | ICD-10-CM | POA: Diagnosis not present

## 2015-09-11 DIAGNOSIS — C50919 Malignant neoplasm of unspecified site of unspecified female breast: Secondary | ICD-10-CM

## 2015-09-11 DIAGNOSIS — C50312 Malignant neoplasm of lower-inner quadrant of left female breast: Secondary | ICD-10-CM | POA: Diagnosis not present

## 2015-09-11 DIAGNOSIS — C787 Secondary malignant neoplasm of liver and intrahepatic bile duct: Secondary | ICD-10-CM | POA: Diagnosis not present

## 2015-09-11 MED ORDER — TBO-FILGRASTIM 300 MCG/0.5ML ~~LOC~~ SOSY
300.0000 ug | PREFILLED_SYRINGE | Freq: Once | SUBCUTANEOUS | Status: AC
  Administered 2015-09-11: 300 ug via SUBCUTANEOUS
  Filled 2015-09-11: qty 0.5

## 2015-09-14 ENCOUNTER — Other Ambulatory Visit: Payer: Self-pay | Admitting: Oncology

## 2015-09-14 ENCOUNTER — Ambulatory Visit (HOSPITAL_COMMUNITY)
Admission: RE | Admit: 2015-09-14 | Discharge: 2015-09-14 | Disposition: A | Discharge status: Home / Self Care | Payer: 59 | Source: Ambulatory Visit | Attending: Oncology | Admitting: Oncology

## 2015-09-14 DIAGNOSIS — T451X5A Adverse effect of antineoplastic and immunosuppressive drugs, initial encounter: Secondary | ICD-10-CM | POA: Insufficient documentation

## 2015-09-14 DIAGNOSIS — D630 Anemia in neoplastic disease: Secondary | ICD-10-CM

## 2015-09-14 DIAGNOSIS — D61810 Antineoplastic chemotherapy induced pancytopenia: Secondary | ICD-10-CM | POA: Insufficient documentation

## 2015-09-14 DIAGNOSIS — Z170 Estrogen receptor positive status [ER+]: Secondary | ICD-10-CM | POA: Insufficient documentation

## 2015-09-14 DIAGNOSIS — C50312 Malignant neoplasm of lower-inner quadrant of left female breast: Secondary | ICD-10-CM | POA: Insufficient documentation

## 2015-09-15 ENCOUNTER — Encounter: Payer: Self-pay | Admitting: Nurse Practitioner

## 2015-09-15 ENCOUNTER — Other Ambulatory Visit: Payer: Self-pay | Admitting: Oncology

## 2015-09-15 ENCOUNTER — Ambulatory Visit (HOSPITAL_BASED_OUTPATIENT_CLINIC_OR_DEPARTMENT_OTHER): Payer: 59

## 2015-09-15 ENCOUNTER — Other Ambulatory Visit: Payer: Self-pay | Admitting: *Deleted

## 2015-09-15 ENCOUNTER — Other Ambulatory Visit (HOSPITAL_BASED_OUTPATIENT_CLINIC_OR_DEPARTMENT_OTHER): Payer: 59

## 2015-09-15 ENCOUNTER — Telehealth: Payer: Self-pay | Admitting: Nurse Practitioner

## 2015-09-15 ENCOUNTER — Ambulatory Visit: Payer: 59

## 2015-09-15 ENCOUNTER — Ambulatory Visit (HOSPITAL_BASED_OUTPATIENT_CLINIC_OR_DEPARTMENT_OTHER): Payer: 59 | Admitting: Nurse Practitioner

## 2015-09-15 VITALS — BP 131/83 | HR 105 | Temp 98.7°F | Resp 18 | Ht 63.0 in | Wt 176.7 lb

## 2015-09-15 DIAGNOSIS — C50312 Malignant neoplasm of lower-inner quadrant of left female breast: Secondary | ICD-10-CM

## 2015-09-15 DIAGNOSIS — C50919 Malignant neoplasm of unspecified site of unspecified female breast: Secondary | ICD-10-CM

## 2015-09-15 DIAGNOSIS — Z95828 Presence of other vascular implants and grafts: Secondary | ICD-10-CM

## 2015-09-15 DIAGNOSIS — Z5189 Encounter for other specified aftercare: Secondary | ICD-10-CM | POA: Diagnosis not present

## 2015-09-15 DIAGNOSIS — Z5111 Encounter for antineoplastic chemotherapy: Secondary | ICD-10-CM

## 2015-09-15 DIAGNOSIS — C770 Secondary and unspecified malignant neoplasm of lymph nodes of head, face and neck: Secondary | ICD-10-CM | POA: Diagnosis not present

## 2015-09-15 DIAGNOSIS — C787 Secondary malignant neoplasm of liver and intrahepatic bile duct: Secondary | ICD-10-CM

## 2015-09-15 DIAGNOSIS — D696 Thrombocytopenia, unspecified: Secondary | ICD-10-CM

## 2015-09-15 LAB — COMPREHENSIVE METABOLIC PANEL (CC13)
ALBUMIN: 2.7 g/dL — AB (ref 3.5–5.0)
ALK PHOS: 172 U/L — AB (ref 40–150)
ALT: 37 U/L (ref 0–55)
AST: 50 U/L — AB (ref 5–34)
Anion Gap: 6 mEq/L (ref 3–11)
BUN: 12.1 mg/dL (ref 7.0–26.0)
CALCIUM: 8.7 mg/dL (ref 8.4–10.4)
CO2: 29 mEq/L (ref 22–29)
CREATININE: 0.6 mg/dL (ref 0.6–1.1)
Chloride: 107 mEq/L (ref 98–109)
EGFR: >90 mL/min/{1.73_m2} (ref >90)
Glucose: 110 mg/dl (ref 70–140)
Potassium: 4.1 mEq/L (ref 3.5–5.1)
Sodium: 142 mEq/L (ref 136–145)
Total Bilirubin: 0.7 mg/dL (ref 0.20–1.20)
Total Protein: 5 g/dL — ABNORMAL LOW (ref 6.4–8.3)

## 2015-09-15 LAB — CBC WITH DIFFERENTIAL/PLATELET
BASO%: 1 % (ref 0.0–2.0)
BASOS ABS: 0 10*3/uL (ref 0.0–0.1)
EOS%: 0.2 % (ref 0.0–7.0)
Eosinophils Absolute: 0 10*3/uL (ref 0.0–0.5)
HEMATOCRIT: 25 % — AB (ref 34.8–46.6)
HEMOGLOBIN: 8.1 g/dL — AB (ref 11.6–15.9)
LYMPH#: 0.4 10*3/uL — AB (ref 0.9–3.3)
LYMPH%: 11.3 % — ABNORMAL LOW (ref 14.0–49.7)
MCH: 30 pg (ref 25.1–34.0)
MCHC: 32.2 g/dL (ref 31.5–36.0)
MCV: 93.1 fL (ref 79.5–101.0)
MONO#: 0.6 10*3/uL (ref 0.1–0.9)
MONO%: 16.4 % — ABNORMAL HIGH (ref 0.0–14.0)
NEUT#: 2.8 10*3/uL (ref 1.5–6.5)
NEUT%: 71.1 % (ref 38.4–76.8)
PLATELETS: 74 10*3/uL — AB (ref 145–400)
RBC: 2.69 10*6/uL — ABNORMAL LOW (ref 3.70–5.45)
RDW: 26.2 % — AB (ref 11.2–14.5)
WBC: 3.9 10*3/uL (ref 3.9–10.3)

## 2015-09-15 MED ORDER — PEGFILGRASTIM 6 MG/0.6ML ~~LOC~~ PSKT
6.0000 mg | PREFILLED_SYRINGE | Freq: Once | SUBCUTANEOUS | Status: AC
  Administered 2015-09-15: 6 mg via SUBCUTANEOUS
  Filled 2015-09-15: qty 0.6

## 2015-09-15 MED ORDER — TRAMADOL HCL 50 MG PO TABS: 50.0000 mg | ORAL_TABLET | Freq: Four times a day (QID) | ORAL | Status: DC | PRN

## 2015-09-15 MED ORDER — SODIUM CHLORIDE 0.9 % IV SOLN
Freq: Once | INTRAVENOUS | Status: AC
  Administered 2015-09-15: 13:00:00 via INTRAVENOUS

## 2015-09-15 MED ORDER — HEPARIN SOD (PORK) LOCK FLUSH 100 UNIT/ML IV SOLN
500.0000 [IU] | Freq: Once | INTRAVENOUS | Status: AC | PRN
  Administered 2015-09-15: 500 [IU]
  Filled 2015-09-15: qty 5

## 2015-09-15 MED ORDER — SODIUM CHLORIDE 0.9 % IV SOLN
Freq: Once | INTRAVENOUS | Status: AC
  Administered 2015-09-15: 13:00:00 via INTRAVENOUS
  Filled 2015-09-15: qty 4

## 2015-09-15 MED ORDER — SODIUM CHLORIDE 0.9 % IV SOLN
140.0000 mg | Freq: Once | INTRAVENOUS | Status: AC
  Administered 2015-09-15: 140 mg via INTRAVENOUS
  Filled 2015-09-15: qty 14

## 2015-09-15 MED ORDER — SODIUM CHLORIDE 0.9 % IJ SOLN
10.0000 mL | INTRAMUSCULAR | Status: DC | PRN
  Administered 2015-09-15: 10 mL via INTRAVENOUS
  Filled 2015-09-15: qty 10

## 2015-09-15 MED ORDER — SODIUM CHLORIDE 0.9 % IJ SOLN
10.0000 mL | INTRAMUSCULAR | Status: DC | PRN
  Administered 2015-09-15: 10 mL
  Filled 2015-09-15: qty 10

## 2015-09-15 MED ORDER — SODIUM CHLORIDE 0.9 % IV SOLN
800.0000 mg/m2 | Freq: Once | INTRAVENOUS | Status: AC
  Administered 2015-09-15: 1520 mg via INTRAVENOUS
  Filled 2015-09-15: qty 40

## 2015-09-15 NOTE — Progress Notes (Signed)
ID: [name] OB: 02-Aug-1966  MR#: [id number]  NUU#:[id number]  PCP: Drake Leach, MD GYN:  [name] SU: [name] OTHER MD: [name], [name]  CHIEF COMPLAINT:  Stage IV breast cancer  CURRENT TREATMENT: carboplatin/ gemcitabine/ aranesp  BREAST CANCER HISTORY: This patient was previously followed by [name], and was transferred to [name] service as of 08/20/2013.  At the age of 62, the patient had a screening mammogram as a baseline. She had recently given birth and was on birth control pills at that time. The mammogram in November 2006 showed an area of architectural distortion in the left lower inner quadrant. Subsequently an ultrasound was obtained of the left breast showing a 2.0 x 1.5 x 1.4 cm mass, at the 8:00 position, 4 cm from the nipple. A core biopsy performed on 10/21/2005 showed an invasive mammary carcinoma, ER +70%, PR +41%, HER-2/neu negative, with proliferation marker of 38%. [phone])  Breast MRI on 11/08/2005 confirmed a 2.5 cm spiculated mass in the left lower inner quadrant with 3 small satellite nodules adjacent to the primary mass: 3.5 cm posterior medial to the primary mass, measuring 9 mm; 1.5 cm anterior medial to the primary mass measuring 5 mm; and 2 cm medial to the primary mass measuring 5 mm. No axillary adenopathy was noted. No suspicious masses or enhancement are noted in the right breast.  Skye underwentt left lumpectomy under the care of Dr. 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 2 of 23 lymph nodes were involved.  Margins were clear.  She received adjuvant chemotherapy consisting of 6 q. three-week doses of docetaxel/doxorubicin/cyclophosphamide given between January 2007 and may of 2007, with last dose on 04/20/2006. She underwent radiation therapy completed 07/11/2006, after which she began on tamoxifen in  early August 2007.  She underwent hysterectomy with bilateral salpingo-oophorectomy on 07/15/2008, and was started on letrozole in October of 2009.  A mammogram 11/10/2009 showed microcalcifications in the left breast, and a subsequent biopsy on 11/18/2009 confirmed invasive mammary carcinoma in the left breast. PET and CT of the chest showed no evidence of metastasis in January 2011.  She underwent bilateral mastectomies 01/25/2010, with the right breast showing no evidence of malignancy; in the left breast showing a 1.0 cm grade 2 invasive ductal carcinoma with high-grade DCIS. Tumor was ER +22%, PR negative, HER-2/neu negative, with proliferation marker of 13%. Margins were clear. Patient underwent concurrent left latissimus flap reconstruction and right implant reconstruction.  She received additional chemotherapy with one cycle of carboplatin/gemcitabine given on 03/11/2010. She then received 5 q. three-week cycles of IV CMF between 03/25/2010 and 06/17/2010.  She was started on exemestane in January 2012 and continued until late November 2013 when she was hospitalized for apparent TIA.   Her subsequent history is as detailed below  INTERVAL HISTORY: Kevionna returns today for follow up of her metastatic breast cancer, alone. Today is day 8, cycle 6 of 6 planned cycles of carboplatin and gemcitabine given on days 1 and 8 of each cycle. She receives Neupogen on days 2,  3 and 4 of each cycle and then onpro on day 9.   REVIEW OF SYSTEMS: Analese denies any changes this week. She is anxious about her upcoming scans, but is otherwise doing well. She denies fevers, chills, nausea, vomiting, or changes in bowel or bladder habits. She believes she is experiencing less shortness of breath.  She is able to get more accomplished without having to sit down. She uses 2LO2 as needed at home. She continues on tramadol and advil for her back pain. She had restless legs nightly previously, but this is slowing down. She  denies mouth sores, rashes, or neuropathy symptoms. A detailed review of systems is otherwise stable.  PAST MEDICaL HISTORY: Past Medical History  Diagnosis Date  . Depression   . Reflux   . Hx-TIA (transient ischemic attack) 11/22/2013  . Chronic headaches 11/22/2013  . Anxiety 11/22/2013  . Breast cancer   . Cancer   . Breast cancer metastasized to multiple sites 12/24/2013    PAST SURGICAL HISTORY: Past Surgical History  Procedure Laterality Date  . Mastectomy w/ nodes partial    . Mastectomy    . Breast reconstruction    . Portacath placement      x 2  . Portacath removal      x 2  . Abdominal hysterectomy    . Tee without cardioversion  11/12/2012    Procedure: TRANSESOPHAGEAL ECHOCARDIOGRAM (TEE);  Surgeon: Candee Furbish, MD;  Location: [facility] ENDOSCOPY;  Service: Cardiovascular;  Laterality: N/A;  This TEE may be Dr. Marlou Porch or a LHC Dr    FAMILY HISTORY Both parents are alive and well. Patient has one sister who is 75 years younger and is in good health. No other history of breast or ovarian cancer in the family. Family History  Problem Relation Age of Onset  . Diabetes Mellitus II Neg Hx   . Hypertension Neg Hx     GYNECOLOGIC HISTORY:   (Updated January 2015) G2P2, menarche at age 58 with irregular menses. On birth control pills in the past. On Clomid to induce ovulation with first pregnancy. Also had preeclampsia with first pregnancy. Status post hysterectomy and bilateral salpingo-oophorectomy in August 2009.  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 She is originally from [place]. She's been married to [place], for 13 years. He works as an Pharmacist, hospital at [employer].   ADVANCED DIRECTIVES:  Not in place; Not in place. At the clinic visit 09/15/2015 the patient was given the appropriate forms to complete and notarize at her discretion.    HEALTH MAINTENANCE:  (Updated  11/22/2013) Social History  Substance Use Topics  . Smoking status: Never Smoker   . Smokeless tobacco: Never Used  . Alcohol Use: Yes     Comment: rarely     Colonoscopy:  Never  PAP: S/P LAVH/BSO in August 2009  Bone density: Never  Lipid panel: Not on file   Allergies  Allergen Reactions  . Afinitor [Everolimus] Hives and Itching    Current Outpatient Prescriptions  Medication Sig Dispense Refill  . acidophilus (RISAQUAD) CAPS capsule Take 1 capsule by mouth every morning.     . Biotin 5000 MCG CAPS Take 5,000 mcg by mouth every morning.  30 capsule   . cetirizine (ZYRTEC) 10 MG tablet Take 10 mg by mouth at bedtime.     . Cholecalciferol 2000 UNITS CHEW Chew 1 tablet by mouth daily with breakfast.     . escitalopram (LEXAPRO) 20 MG tablet Take 1 tablet (20 mg total) by mouth daily. 30 tablet 12  . Ibuprofen (ADVIL) 200 MG CAPS Take 2 capsules (400 mg total) by mouth 3 (three) times daily with meals as needed. 120 each 0  . LORazepam (ATIVAN) 0.5 MG tablet Take  1 tablet (0.5 mg total) by mouth every 6 (six) hours as needed for anxiety. 120 tablet 1  . Melatonin 10 MG TABS Take 1 tablet by mouth at bedtime.    . montelukast (SINGULAIR) 10 MG tablet Take 10 mg by mouth daily with breakfast.     . Multiple Vitamin (MULTIVITAMIN) tablet Take 1 tablet by mouth every morning.     Marland Kitchen omeprazole (PRILOSEC) 40 MG capsule Take 40 mg by mouth every morning.     . ondansetron (ZOFRAN) 8 MG tablet Take 8 mg by mouth 2 (two) times daily. Starts day after chemo    . filgrastim (NEUPOGEN) 300 MCG/0.5ML SOSY injection Inject 0.5 mLs (300 mcg total) into the skin once. (Patient not taking: Reported on 09/15/2015) 6 Syringe 1  . nystatin (MYCOSTATIN) 100000 UNIT/ML suspension Take 5 mLs (500,000 Units total) by mouth 4 (four) times daily. (Patient not taking: Reported on 07/13/2015) 240 mL 1  . prochlorperazine (COMPAZINE) 10 MG tablet Take 10 mg by mouth every 6 (six) hours as needed for nausea or  vomiting.     . traMADol (ULTRAM) 50 MG tablet Take 1 tablet (50 mg total) by mouth every 6 (six) hours as needed. 120 tablet 1   No current facility-administered medications for this visit.   Facility-Administered Medications Ordered in Other Visits  Medication Dose Route Frequency Provider Last Rate Last Dose  . sodium chloride 0.9 % injection 10 mL  10 mL Intravenous PRN Chauncey Cruel, MD   10 mL at 09/15/15 1121    OBJECTIVE: Young white woman who appears stated age 42 Vitals:   09/15/15 1135  BP: 131/83  Pulse: 105  Temp: 98.7 F (37.1 C)  Resp: 18  Body mass index is 31.31 kg/(m^2).  ECOG: 2-3 Filed Weights   09/15/15 1135  Weight: 176 lb 11.2 oz (80.151 kg)   Skin: warm, dry  HEENT: sclerae anicteric, conjunctivae pink, oropharynx clear. No thrush or mucositis.  Lymph Nodes: No cervical or supraclavicular lymphadenopathy  Lungs: clear to auscultation bilaterally, no rales, wheezes, or rhonci  Heart: regular rate and rhythm  Abdomen: round, soft, non tender, positive bowel sounds  Musculoskeletal: No focal spinal tenderness, no peripheral edema  Neuro: non focal, well oriented, positive affect Breasts: deferred  LAB RESULTS:   Lab Results  Component Value Date   WBC 3.9 09/15/2015   NEUTROABS 2.8 09/15/2015   HGB 8.1* 09/15/2015   HCT 25.0* 09/15/2015   MCV 93.1 09/15/2015   PLT 74* 09/15/2015      Chemistry      Component Value Date/Time   NA 142 09/15/2015 1051   NA 138 05/30/2015 0525   K 4.1 09/15/2015 1051   K 3.7 05/30/2015 0525   CL 104 05/30/2015 0525   CL 104 11/14/2012 1407   CO2 29 09/15/2015 1051   CO2 27 05/30/2015 0525   BUN 12.1 09/15/2015 1051   BUN 16 05/30/2015 0525   CREATININE 0.6 09/15/2015 1051   CREATININE 0.68 05/30/2015 0525      Component Value Date/Time   CALCIUM 8.7 09/15/2015 1051   CALCIUM 8.2* 05/30/2015 0525   ALKPHOS 172* 09/15/2015 1051   ALKPHOS 88 05/30/2015 0525   AST 50* 09/15/2015 1051   AST 193*  05/30/2015 0525   ALT 37 09/15/2015 1051   ALT 76* 05/30/2015 0525   BILITOT 0.70 09/15/2015 1051   BILITOT 1.0 05/30/2015 0525     STUDIES: No results found.  ASSESSMENT: 49 y.o. [place] woman  (  1)  status post left lumpectomy under the care of Dr. 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 2 of 23 lymph nodes were involved.  Margins were clear.  (2) status post adjuvant chemotherapy consisting of 6 q. three-week doses of docetaxel/ doxorubicin/ cyclophosphamide given between January 2007 and may of 2007, with last dose on 04/20/2006.  (3) status post radiation therapy to the left breast, completed 07/11/2006  (4) began on tamoxifen in early August 2007 and continued until October 2009. She status post hysterectomy with bilateral salpingo-oophorectomy on 07/15/2008, and was started on letrozole in October of 2009.  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 PET and CT of the chest showed no evidence of metastasis in January 2011.  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 Margins were clear. Patient underwent concurrent left latissimus flap reconstruction and right implant reconstruction.  (7)  status post additional chemotherapy with one cycle of carboplatin/gemcitabine given on 03/11/2010. She then received 5 q. three-week cycles of IV CMF between 03/25/2010 and 06/17/2010.  (8) started on exemestane in January 2012 and continued until late November 2013 when she was hospitalized for apparent TIA at which time her exemestane was stopped and not  resumed  (9) METASTATIC DISEASE: evidence of disease recurence noted on chest CT, liver MRI and PET scan in December 2014, with suspicious lung nodules, lymphadenopathy, and 3 lesions in the right lobe of the liver, but no evidence of bony disease and no evidence of brain involvement by brain MRI September 2014. Biopsy of a left supraclavicular lymph node on 12/11/2013 confirmed metastatic carcinoma, estrogen receptor 45% positive, progesterone receptor 17% positive, with no HER-2 amplification.  (10) fulvestrant started 12/03/2013, discontinued 03/25/2014 with progression  (11) exemestane/ everolimus started 04/04/2014; everolimus held 04/14/2014, with skin rash and mouth soreness; resumed at 5 mg a day as of 04/29/2014, discontinued 05/09/2014 with similar symptoms  (12) continueing exemestane, adding palbociclib 05/26/2014;   (a) palbociclib dose dropped to 75 mg every other day for 21 days beginning with cycle 4  (b) exemestane and Palbociclib discontinued December 2015 with evidence of progression  (13) [facility] referral placed 04/28/2049 -- re-sent 05/23/2014-- shows Pi3KCA and TP53 mutations  (a) [facility] one test requested 12/10/2014-- confirms [employer] results  (b) did not qualify for capecitabine/ BYL 719 trial because of elevated lipase  (14) started eribulin 12/26/2014, 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   (a) dpse dropped from 2g BID to 1.5g BID starting on 04/26/15  (b) stopped 05/17/2015 (after 4 cycles) with progression  (16) started carboplatin/ gemcitabine 05/26/2015, to be given day one and day 8 of each 21 day cycle  (a) given her history of severe neutropenia with this chemotherapy we'll do Neupogen days 2, 3 and 4 of each cycle and onpro day 9  (b) carbo dose dropped by 25% w cycle 2 due to  very low platelet nadir  (c) CT scans after 3 cycles (07/27/2015) show measurable response  (17) Left  pleural effusion\   (a) s/p L thoracentesis 05/22/2015, 05/29/2015 and 06/05/2015  (b) attempt at pleurx 06/12/2015 cancelled as effusion loculated and scant  (18) symptomatic anemia  (a) Aranesp started 07/28/2015  (b) Feraheme to be given 08/04/2015  PLAN:  Neenah looks and feels decently today. The labs were reviewed in detail and her platelets are down to 74. I consulted with [name], and he suggested proceeding with day 8, cycle 6 of gemcitabine and carboplatin today, but reducing the carboplatin by 20%. She is already scheduled for blood products this upcoming Monday, and will receive platelets as necessary at that time.    Andrianna will return in 2 weeks for follow up with [name]. Prior to this visit she will have CT scans of the brain, chest, abdomen, and pelvis. She understands and agrees with this plan. She knows the goal of treatment in her case is control. She has been encouraged to call with any issues that might arise before her next visit here.   Laurie Panda, NP   09/15/2015 12:24 PM

## 2015-09-15 NOTE — Patient Instructions (Signed)

## 2015-09-15 NOTE — Telephone Encounter (Signed)
Appointments made and avs pritned for pateint °

## 2015-09-15 NOTE — Patient Instructions (Signed)
[facility] Discharge Instructions for Patients Receiving Chemotherapy  Today you received the following chemotherapy agents Gemzar/Carboplatin.   To help prevent nausea and vomiting after your treatment, we encourage you to take your nausea medication as directed.    If you develop nausea and vomiting that is not controlled by your nausea medication, call the clinic.   BELOW ARE SYMPTOMS THAT SHOULD BE REPORTED IMMEDIATELY:  *FEVER GREATER THAN 100.5 F  *CHILLS WITH OR WITHOUT FEVER  NAUSEA AND VOMITING THAT IS NOT CONTROLLED WITH YOUR NAUSEA MEDICATION  *UNUSUAL SHORTNESS OF BREATH  *UNUSUAL BRUISING OR BLEEDING  TENDERNESS IN MOUTH AND THROAT WITH OR WITHOUT PRESENCE OF ULCERS  *URINARY PROBLEMS  *BOWEL PROBLEMS  UNUSUAL RASH Items with * indicate a potential emergency and should be followed up as soon as possible.  Feel free to call the clinic you have any questions or concerns. The clinic phone number is [phone].  Please show the CHEMO ALERT CARD at check-in to the Emergency Department and triage nurse.   

## 2015-09-15 NOTE — Progress Notes (Unsigned)
Labs reviewed with Gentry Fitz, NP: OK to treat despite PLT 74k. [name] to reduce chemo dose.

## 2015-09-15 NOTE — Telephone Encounter (Signed)
Active treatment  

## 2015-09-21 ENCOUNTER — Ambulatory Visit: Payer: 59

## 2015-09-21 ENCOUNTER — Other Ambulatory Visit (HOSPITAL_BASED_OUTPATIENT_CLINIC_OR_DEPARTMENT_OTHER): Payer: 59

## 2015-09-21 ENCOUNTER — Other Ambulatory Visit: Payer: 59

## 2015-09-21 ENCOUNTER — Other Ambulatory Visit: Payer: Self-pay | Admitting: Nurse Practitioner

## 2015-09-21 ENCOUNTER — Ambulatory Visit (HOSPITAL_BASED_OUTPATIENT_CLINIC_OR_DEPARTMENT_OTHER): Payer: 59

## 2015-09-21 VITALS — BP 120/68 | HR 94 | Temp 99.0°F | Resp 16

## 2015-09-21 DIAGNOSIS — D630 Anemia in neoplastic disease: Secondary | ICD-10-CM

## 2015-09-21 DIAGNOSIS — C50312 Malignant neoplasm of lower-inner quadrant of left female breast: Secondary | ICD-10-CM

## 2015-09-21 DIAGNOSIS — Z170 Estrogen receptor positive status [ER+]: Secondary | ICD-10-CM | POA: Diagnosis not present

## 2015-09-21 DIAGNOSIS — Z95828 Presence of other vascular implants and grafts: Secondary | ICD-10-CM

## 2015-09-21 DIAGNOSIS — T451X5A Adverse effect of antineoplastic and immunosuppressive drugs, initial encounter: Principal | ICD-10-CM

## 2015-09-21 DIAGNOSIS — D61810 Antineoplastic chemotherapy induced pancytopenia: Secondary | ICD-10-CM | POA: Diagnosis present

## 2015-09-21 DIAGNOSIS — C50919 Malignant neoplasm of unspecified site of unspecified female breast: Secondary | ICD-10-CM

## 2015-09-21 DIAGNOSIS — D6481 Anemia due to antineoplastic chemotherapy: Secondary | ICD-10-CM

## 2015-09-21 LAB — CBC WITH DIFFERENTIAL/PLATELET
BASO%: 0.2 % (ref 0.0–2.0)
BASOS ABS: 0 10*3/uL (ref 0.0–0.1)
EOS ABS: 0 10*3/uL (ref 0.0–0.5)
EOS%: 0.1 % (ref 0.0–7.0)
HCT: 20.6 % — ABNORMAL LOW (ref 34.8–46.6)
HEMOGLOBIN: 6.4 g/dL — AB (ref 11.6–15.9)
LYMPH#: 0.4 10*3/uL — AB (ref 0.9–3.3)
LYMPH%: 3.8 % — ABNORMAL LOW (ref 14.0–49.7)
MCH: 31.2 pg (ref 25.1–34.0)
MCHC: 31.1 g/dL — ABNORMAL LOW (ref 31.5–36.0)
MCV: 100.5 fL (ref 79.5–101.0)
MONO#: 0.4 10*3/uL (ref 0.1–0.9)
MONO%: 3.9 % (ref 0.0–14.0)
NEUT#: 9.7 10*3/uL — ABNORMAL HIGH (ref 1.5–6.5)
NEUT%: 92 % — AB (ref 38.4–76.8)
NRBC: 0 % (ref 0–0)
PLATELETS: 6 10*3/uL — AB (ref 145–400)
RBC: 2.05 10*6/uL — ABNORMAL LOW (ref 3.70–5.45)
RDW: 25.4 % — AB (ref 11.2–14.5)
WBC: 10.6 10*3/uL — ABNORMAL HIGH (ref 3.9–10.3)

## 2015-09-21 LAB — COMPREHENSIVE METABOLIC PANEL (CC13)
ALBUMIN: 2.5 g/dL — AB (ref 3.5–5.0)
ALK PHOS: 187 U/L — AB (ref 40–150)
ALT: 36 U/L (ref 0–55)
ANION GAP: 6 meq/L (ref 3–11)
AST: 42 U/L — ABNORMAL HIGH (ref 5–34)
BILIRUBIN TOTAL: 0.97 mg/dL (ref 0.20–1.20)
BUN: 16.8 mg/dL (ref 7.0–26.0)
CALCIUM: 8.2 mg/dL — AB (ref 8.4–10.4)
CO2: 26 mEq/L (ref 22–29)
Chloride: 107 mEq/L (ref 98–109)
Creatinine: 0.6 mg/dL (ref 0.6–1.1)
GLUCOSE: 119 mg/dL (ref 70–140)
POTASSIUM: 3.7 meq/L (ref 3.5–5.1)
Sodium: 140 mEq/L (ref 136–145)
TOTAL PROTEIN: 4.3 g/dL — AB (ref 6.4–8.3)

## 2015-09-21 LAB — HOLD TUBE, BLOOD BANK

## 2015-09-21 LAB — PREPARE RBC (CROSSMATCH)

## 2015-09-21 MED ORDER — SODIUM CHLORIDE 0.9 % IJ SOLN
10.0000 mL | INTRAMUSCULAR | Status: DC | PRN
  Administered 2015-09-21: 10 mL via INTRAVENOUS
  Filled 2015-09-21: qty 10

## 2015-09-21 MED ORDER — SODIUM CHLORIDE 0.9 % IJ SOLN
10.0000 mL | INTRAMUSCULAR | Status: AC | PRN
  Administered 2015-09-21: 10 mL
  Filled 2015-09-21: qty 10

## 2015-09-21 MED ORDER — DIPHENHYDRAMINE HCL 25 MG PO CAPS
25.0000 mg | ORAL_CAPSULE | Freq: Once | ORAL | Status: AC
  Administered 2015-09-21: 25 mg via ORAL

## 2015-09-21 MED ORDER — SODIUM CHLORIDE 0.9 % IV SOLN
250.0000 mL | Freq: Once | INTRAVENOUS | Status: AC
  Administered 2015-09-21: 250 mL via INTRAVENOUS

## 2015-09-21 MED ORDER — ACETAMINOPHEN 325 MG PO TABS
ORAL_TABLET | ORAL | Status: AC
Start: 2015-09-21 — End: 2015-09-21
  Filled 2015-09-21: qty 2

## 2015-09-21 MED ORDER — HEPARIN SOD (PORK) LOCK FLUSH 100 UNIT/ML IV SOLN
500.0000 [IU] | Freq: Every day | INTRAVENOUS | Status: AC | PRN
  Administered 2015-09-21: 500 [IU]
  Filled 2015-09-21: qty 5

## 2015-09-21 MED ORDER — ACETAMINOPHEN 325 MG PO TABS
650.0000 mg | ORAL_TABLET | Freq: Once | ORAL | Status: AC
  Administered 2015-09-21: 650 mg via ORAL

## 2015-09-21 MED ORDER — HEPARIN SOD (PORK) LOCK FLUSH 100 UNIT/ML IV SOLN
500.0000 [IU] | Freq: Once | INTRAVENOUS | Status: DC
Start: 2015-09-21 — End: 2015-09-21
  Filled 2015-09-21: qty 5

## 2015-09-21 MED ORDER — DIPHENHYDRAMINE HCL 25 MG PO CAPS
ORAL_CAPSULE | ORAL | Status: AC
  Filled 2015-09-21: qty 1

## 2015-09-21 NOTE — Patient Instructions (Signed)

## 2015-09-21 NOTE — Patient Instructions (Signed)
Blood Transfusion  A blood transfusion is a procedure in which you receive donated blood through an IV tube. You may need a blood transfusion because of illness, surgery, or injury. The blood may come from a donor, or it may be your own blood that you donated previously. The blood given in a transfusion is made up of different types of cells. You may receive:  Red blood cells. These carry oxygen and replace lost blood.  Platelets. These control bleeding.  Plasma. Thishelps blood to clot. If you have hemophilia or another clotting disorder, you may also receive other types of blood products. LET [facility] CARE PROVIDER KNOW ABOUT:  Any allergies you have.  All medicines you are taking, including vitamins, herbs, eye drops, creams, and over-the-counter medicines.  Previous problems you or members of your family have had with the use of anesthetics.  Any blood disorders you have.  Previous surgeries you have had.  Any medical conditions you may have.  Any previous reactions you have had during a blood transfusion.  RISKS AND COMPLICATIONS Generally, this is a safe procedure. However, problems may occur, including:  Having an allergic reaction to something in the donated blood.  Fever. This may be a reaction to the white blood cells in the transfused blood.  Iron overload. This can happen from having many transfusions.  Transfusion-related acute lung injury (TRALI). This is a rare reaction that causes lung damage. The cause is not known.TRALI can occur within hours of a transfusion or several days later.  Sudden (acute) or delayed hemolytic reactions. This happens if your blood does not match the cells in your transfusion. Your body's defense system (immune system) may try to attack the new cells. This complication is rare.  Infection. This is rare. BEFORE THE PROCEDURE  You may have a blood test to determine your blood type. This is necessary to know what kind of blood your  body will accept.  If you are going to have a planned surgery, you may donate your own blood. This may be done in case you need to have a transfusion.  If you have had an allergic reaction to a transfusion in the past, you may be given medicine to help prevent a reaction. Take this medicine only as directed by your health care provider.  You will have your temperature, blood pressure, and pulse monitored before the transfusion. PROCEDURE   An IV will be started in your hand or arm.  The bag of donated blood will be attached to your IV tube and given into your vein.  Your temperature, blood pressure, and pulse will be monitored regularly during the transfusion. This monitoring is done to detect early signs of a transfusion reaction.  If you have any signs or symptoms of a reaction, your transfusion will be stopped and you may be given medicine.  When the transfusion is over, your IV will be removed.  Pressure may be applied to the IV site for a few minutes.  A bandage (dressing) will be applied. The procedure may vary among health care providers and hospitals. AFTER THE PROCEDURE  Your blood pressure, temperature, and pulse will be monitored regularly.   This information is not intended to replace advice given to you by your health care provider. Make sure you discuss any questions you have with your health care provider.   Document Released: 11/25/2000 Document Revised: 12/19/2014 Document Reviewed: 10/08/2014 Elsevier Interactive Patient Education 2016 [employer]. Thrombocytopenia Thrombocytopenia is a condition in which there  is an abnormally small number of platelets in your blood. Platelets are also called thrombocytes. Platelets are needed for blood clotting. CAUSES Thrombocytopenia is caused by:   Decreased production of platelets. This can be caused by:  Aplastic anemia in which your bone marrow quits making blood cells.  Cancer in the bone marrow.  Use of certain  medicines, including chemotherapy.  Infection in the bone marrow.  Heavy alcohol consumption.  Increased destruction of platelets. This can be caused by:  Certain immune diseases.  Use of certain drugs.  Certain blood clotting disorders.  Certain inherited disorders.  Certain bleeding disorders.  Pregnancy.  Having an enlarged spleen (hypersplenism). In hypersplenism, the spleen gathers up platelets from circulation. This means the platelets are not available to help with blood clotting. The spleen can enlarge due to cirrhosis or other conditions. SYMPTOMS  The symptoms of thrombocytopenia are side effects of poor blood clotting. Some of these are:  Abnormal bleeding.  Nosebleeds.  Heavy menstrual periods.  Blood in the urine or stools.  Purpura. This is a purplish discoloration in the skin produced by small bleeding vessels near the surface of the skin.  Bruising.  A rash that may be petechial. This looks like pinpoint, purplish-red spots on the skin and mucous membranes. It is caused by bleeding from small blood vessels (capillaries). DIAGNOSIS  Your caregiver will make this diagnosis based on your exam and blood tests. Sometimes, a bone marrow study is done to look for the original cells (megakaryocytes) that make platelets. TREATMENT  Treatment depends on the cause of the condition.  Medicines may be given to help protect your platelets from being destroyed.  In some cases, a replacement (transfusion) of platelets may be required to stop or prevent bleeding.  Sometimes, the spleen must be surgically removed. HOME CARE INSTRUCTIONS   Check the skin and linings inside your mouth for bruising or bleeding as directed by your caregiver.  Check your sputum, urine, and stool for blood as directed by your caregiver.  Do not return to any activities that could cause bumps or bruises until your caregiver says it is okay.  Take extra care not to cut yourself when  shaving or when using scissors, needles, knives, and other tools.  Take extra care not to burn yourself when ironing or cooking.  Ask your caregiver if it is okay for you to drink alcohol.  Only take over-the-counter or prescription medicines as directed by your caregiver.  Notify all your caregivers, including dentists and eye doctors, about your condition. SEEK IMMEDIATE MEDICAL CARE IF:   You develop active bleeding from anywhere in your body.  You develop unexplained bruising or bleeding.  You have blood in your sputum, urine, or stool. MAKE SURE YOU:  Understand these instructions.  Will watch your condition.  Will get help right away if you are not doing well or get worse.   This information is not intended to replace advice given to you by your health care provider. Make sure you discuss any questions you have with your health care provider.   Document Released: 11/28/2005 Document Revised: 02/20/2012 Document Reviewed: 06/01/2015 Elsevier Interactive Patient Education [employer].

## 2015-09-22 LAB — PREPARE PLATELET PHERESIS: Unit division: 0

## 2015-09-22 LAB — TYPE AND SCREEN
ABO/RH(D): A POS
ANTIBODY SCREEN: NEGATIVE
UNIT DIVISION: 0
UNIT DIVISION: 0

## 2015-09-28 ENCOUNTER — Ambulatory Visit (HOSPITAL_COMMUNITY)
Admission: RE | Admit: 2015-09-28 | Discharge: 2015-09-28 | Disposition: A | Discharge status: Home / Self Care | Payer: 59 | Source: Ambulatory Visit | Attending: Nurse Practitioner | Admitting: Nurse Practitioner

## 2015-09-28 ENCOUNTER — Encounter (HOSPITAL_COMMUNITY): Payer: Self-pay

## 2015-09-28 DIAGNOSIS — C7951 Secondary malignant neoplasm of bone: Secondary | ICD-10-CM | POA: Diagnosis not present

## 2015-09-28 DIAGNOSIS — C50919 Malignant neoplasm of unspecified site of unspecified female breast: Secondary | ICD-10-CM | POA: Insufficient documentation

## 2015-09-28 DIAGNOSIS — R590 Localized enlarged lymph nodes: Secondary | ICD-10-CM | POA: Diagnosis not present

## 2015-09-28 DIAGNOSIS — Z853 Personal history of malignant neoplasm of breast: Secondary | ICD-10-CM | POA: Insufficient documentation

## 2015-09-28 DIAGNOSIS — C7800 Secondary malignant neoplasm of unspecified lung: Secondary | ICD-10-CM | POA: Insufficient documentation

## 2015-09-28 DIAGNOSIS — Z9013 Acquired absence of bilateral breasts and nipples: Secondary | ICD-10-CM | POA: Diagnosis not present

## 2015-09-28 DIAGNOSIS — C7889 Secondary malignant neoplasm of other digestive organs: Secondary | ICD-10-CM | POA: Diagnosis not present

## 2015-09-28 DIAGNOSIS — R188 Other ascites: Secondary | ICD-10-CM | POA: Insufficient documentation

## 2015-09-28 DIAGNOSIS — C799 Secondary malignant neoplasm of unspecified site: Secondary | ICD-10-CM | POA: Diagnosis present

## 2015-09-28 MED ORDER — IOHEXOL 300 MG/ML  SOLN
100.0000 mL | Freq: Once | INTRAMUSCULAR | Status: AC | PRN
  Administered 2015-09-28: 100 mL via INTRAVENOUS

## 2015-09-29 ENCOUNTER — Other Ambulatory Visit (HOSPITAL_BASED_OUTPATIENT_CLINIC_OR_DEPARTMENT_OTHER): Payer: 59

## 2015-09-29 ENCOUNTER — Telehealth: Payer: Self-pay | Admitting: *Deleted

## 2015-09-29 ENCOUNTER — Ambulatory Visit: Payer: 59

## 2015-09-29 ENCOUNTER — Telehealth: Payer: Self-pay | Admitting: Oncology

## 2015-09-29 ENCOUNTER — Ambulatory Visit (HOSPITAL_BASED_OUTPATIENT_CLINIC_OR_DEPARTMENT_OTHER): Payer: 59 | Admitting: Oncology

## 2015-09-29 ENCOUNTER — Ambulatory Visit (HOSPITAL_BASED_OUTPATIENT_CLINIC_OR_DEPARTMENT_OTHER): Payer: 59

## 2015-09-29 VITALS — BP 128/79 | HR 96 | Temp 98.9°F | Resp 18 | Ht 63.0 in | Wt 182.1 lb

## 2015-09-29 DIAGNOSIS — Z5111 Encounter for antineoplastic chemotherapy: Secondary | ICD-10-CM

## 2015-09-29 DIAGNOSIS — D630 Anemia in neoplastic disease: Secondary | ICD-10-CM

## 2015-09-29 DIAGNOSIS — C787 Secondary malignant neoplasm of liver and intrahepatic bile duct: Secondary | ICD-10-CM

## 2015-09-29 DIAGNOSIS — C50919 Malignant neoplasm of unspecified site of unspecified female breast: Secondary | ICD-10-CM

## 2015-09-29 DIAGNOSIS — Z95828 Presence of other vascular implants and grafts: Secondary | ICD-10-CM

## 2015-09-29 DIAGNOSIS — C50312 Malignant neoplasm of lower-inner quadrant of left female breast: Secondary | ICD-10-CM

## 2015-09-29 LAB — CBC WITH DIFFERENTIAL/PLATELET
BASO%: 0.6 % (ref 0.0–2.0)
Basophils Absolute: 0 10*3/uL (ref 0.0–0.1)
EOS ABS: 0 10*3/uL (ref 0.0–0.5)
EOS%: 0.6 % (ref 0.0–7.0)
HCT: 25.2 % — ABNORMAL LOW (ref 34.8–46.6)
HEMOGLOBIN: 8 g/dL — AB (ref 11.6–15.9)
LYMPH%: 16.5 % (ref 14.0–49.7)
MCH: 31.3 pg (ref 25.1–34.0)
MCHC: 31.9 g/dL (ref 31.5–36.0)
MCV: 97.9 fL (ref 79.5–101.0)
MONO#: 0.8 10*3/uL (ref 0.1–0.9)
MONO%: 21.5 % — AB (ref 0.0–14.0)
NEUT%: 60.8 % (ref 38.4–76.8)
NEUTROS ABS: 2.3 10*3/uL (ref 1.5–6.5)
PLATELETS: 68 10*3/uL — AB (ref 145–400)
RBC: 2.57 10*6/uL — ABNORMAL LOW (ref 3.70–5.45)
RDW: 24.3 % — AB (ref 11.2–14.5)
WBC: 3.8 10*3/uL — AB (ref 3.9–10.3)
lymph#: 0.6 10*3/uL — ABNORMAL LOW (ref 0.9–3.3)

## 2015-09-29 LAB — COMPREHENSIVE METABOLIC PANEL (CC13)
ALBUMIN: 2.6 g/dL — AB (ref 3.5–5.0)
ALK PHOS: 158 U/L — AB (ref 40–150)
ALT: 39 U/L (ref 0–55)
ANION GAP: 6 meq/L (ref 3–11)
AST: 54 U/L — ABNORMAL HIGH (ref 5–34)
BILIRUBIN TOTAL: 0.69 mg/dL (ref 0.20–1.20)
BUN: 6.3 mg/dL — ABNORMAL LOW (ref 7.0–26.0)
CO2: 28 mEq/L (ref 22–29)
Calcium: 8 mg/dL — ABNORMAL LOW (ref 8.4–10.4)
Chloride: 109 mEq/L (ref 98–109)
Creatinine: 0.7 mg/dL (ref 0.6–1.1)
GLUCOSE: 123 mg/dL (ref 70–140)
Potassium: 3.2 mEq/L — ABNORMAL LOW (ref 3.5–5.1)
Sodium: 143 mEq/L (ref 136–145)
TOTAL PROTEIN: 4.5 g/dL — AB (ref 6.4–8.3)

## 2015-09-29 MED ORDER — SODIUM CHLORIDE 0.9 % IJ SOLN
10.0000 mL | INTRAMUSCULAR | Status: DC | PRN
  Administered 2015-09-29: 10 mL via INTRAVENOUS
  Filled 2015-09-29: qty 10

## 2015-09-29 MED ORDER — SODIUM CHLORIDE 0.9 % IJ SOLN
10.0000 mL | INTRAMUSCULAR | Status: DC | PRN
  Administered 2015-09-29: 10 mL
  Filled 2015-09-29: qty 10

## 2015-09-29 MED ORDER — HEPARIN SOD (PORK) LOCK FLUSH 100 UNIT/ML IV SOLN
500.0000 [IU] | Freq: Once | INTRAVENOUS | Status: AC | PRN
  Administered 2015-09-29: 500 [IU]
  Filled 2015-09-29: qty 5

## 2015-09-29 MED ORDER — DEXAMETHASONE SODIUM PHOSPHATE 100 MG/10ML IJ SOLN
Freq: Once | INTRAMUSCULAR | Status: AC
  Administered 2015-09-29: 11:00:00 via INTRAVENOUS
  Filled 2015-09-29: qty 4

## 2015-09-29 MED ORDER — SODIUM CHLORIDE 0.9 % IV SOLN
800.0000 mg/m2 | Freq: Once | INTRAVENOUS | Status: AC
  Administered 2015-09-29: 1520 mg via INTRAVENOUS
  Filled 2015-09-29: qty 39.98

## 2015-09-29 MED ORDER — DARBEPOETIN ALFA 500 MCG/ML IJ SOSY
500.0000 ug | PREFILLED_SYRINGE | Freq: Once | INTRAMUSCULAR | Status: AC
  Administered 2015-09-29: 500 ug via SUBCUTANEOUS
  Filled 2015-09-29: qty 1

## 2015-09-29 MED ORDER — CARBOPLATIN CHEMO INTRADERMAL TEST DOSE 100MCG/0.02ML
100.0000 ug | Freq: Once | INTRADERMAL | Status: AC
  Administered 2015-09-29: 100 ug via INTRADERMAL
  Filled 2015-09-29: qty 0.01

## 2015-09-29 MED ORDER — SODIUM CHLORIDE 0.9 % IV SOLN
Freq: Once | INTRAVENOUS | Status: AC
  Administered 2015-09-29: 10:00:00 via INTRAVENOUS

## 2015-09-29 MED ORDER — CARBOPLATIN CHEMO INJECTION 450 MG/45ML
200.0000 mg | Freq: Once | INTRAVENOUS | Status: AC
  Administered 2015-09-29: 200 mg via INTRAVENOUS
  Filled 2015-09-29: qty 20

## 2015-09-29 NOTE — Telephone Encounter (Signed)
Per staff message and POF I have scheduled appts. Advised scheduler of appts. JMW  

## 2015-09-29 NOTE — Patient Instructions (Signed)
[facility] Discharge Instructions for Patients Receiving Chemotherapy  Today you received the following chemotherapy agents: Gemzar and Carboplatin   To help prevent nausea and vomiting after your treatment, we encourage you to take your nausea medication as directed.    If you develop nausea and vomiting that is not controlled by your nausea medication, call the clinic.   BELOW ARE SYMPTOMS THAT SHOULD BE REPORTED IMMEDIATELY:  *FEVER GREATER THAN 100.5 F  *CHILLS WITH OR WITHOUT FEVER  NAUSEA AND VOMITING THAT IS NOT CONTROLLED WITH YOUR NAUSEA MEDICATION  *UNUSUAL SHORTNESS OF BREATH  *UNUSUAL BRUISING OR BLEEDING  TENDERNESS IN MOUTH AND THROAT WITH OR WITHOUT PRESENCE OF ULCERS  *URINARY PROBLEMS  *BOWEL PROBLEMS  UNUSUAL RASH Items with * indicate a potential emergency and should be followed up as soon as possible.  Feel free to call the clinic you have any questions or concerns. The clinic phone number is [phone].  Please show the CHEMO ALERT CARD at check-in to the Emergency Department and triage nurse.   

## 2015-09-29 NOTE — Patient Instructions (Signed)

## 2015-09-29 NOTE — Progress Notes (Signed)
ID: Kristin Hoffman OB: 11/06/1966  MR#: [id number]  ULA#:[id number]  PCP: Drake Leach, MD GYN:  [name] SU: [name] OTHER MD: [name], [name]  CHIEF COMPLAINT:  Stage IV breast cancer  CURRENT TREATMENT: carboplatin/ gemcitabine/ aranesp  BREAST CANCER HISTORY: This patient was previously followed by [name], and was transferred to [name] service as of 08/20/2013.  At the age of 49, the patient had a screening mammogram as a baseline. She had recently given birth and was on birth control pills at that time. The mammogram in November 2006 showed an area of architectural distortion in the left lower inner quadrant. Subsequently an ultrasound was obtained of the left breast showing a 2.0 x 1.5 x 1.4 cm mass, at the 8:00 position, 4 cm from the nipple. A core biopsy performed on 10/21/2005 showed an invasive mammary carcinoma, ER +70%, PR +41%, HER-2/neu negative, with proliferation marker of 38%. [phone])  Breast MRI on 11/08/2005 confirmed a 2.5 cm spiculated mass in the left lower inner quadrant with 3 small satellite nodules adjacent to the primary mass: 3.5 cm posterior medial to the primary mass, measuring 9 mm; 1.5 cm anterior medial to the primary mass measuring 5 mm; and 2 cm medial to the primary mass measuring 5 mm. No axillary adenopathy was noted. No suspicious masses or enhancement are noted in the right breast.  Kristin Hoffman underwentt left lumpectomy under the care of Dr. 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 2 of 23 lymph nodes were involved.  Margins were clear.  She received adjuvant chemotherapy consisting of 6 q. three-week doses of docetaxel/doxorubicin/cyclophosphamide given between January 2007 and may of 2007, with last dose on 04/20/2006. She underwent radiation therapy completed 07/11/2006, after which she began on tamoxifen in  early August 2007.  She underwent hysterectomy with bilateral salpingo-oophorectomy on 07/15/2008, and was started on letrozole in October of 2009.  A mammogram 11/10/2009 showed microcalcifications in the left breast, and a subsequent biopsy on 11/18/2009 confirmed invasive mammary carcinoma in the left breast. PET and CT of the chest showed no evidence of metastasis in January 2011.  She underwent bilateral mastectomies 01/25/2010, with the right breast showing no evidence of malignancy; in the left breast showing a 1.0 cm grade 2 invasive ductal carcinoma with high-grade DCIS. Tumor was ER +22%, PR negative, HER-2/neu negative, with proliferation marker of 13%. Margins were clear. Patient underwent concurrent left latissimus flap reconstruction and right implant reconstruction.  She received additional chemotherapy with one cycle of carboplatin/gemcitabine given on 03/11/2010. She then received 5 q. three-week cycles of IV CMF between 03/25/2010 and 06/17/2010.  She was started on exemestane in January 2012 and continued until late November 2013 when she was hospitalized for apparent TIA.   Her subsequent history is as detailed below  INTERVAL HISTORY: Kristin Hoffman returns today for follow up of her metastatic breast cancer accompanied by her husband Kristin Hoffman.. Today is day 1, cycle 7 of 9 planned cycles of carboplatin and gemcitabine given on days 1 and 8 of each cycle. She receives Neupogen on days 2,  3 and 4 of each cycle and then onpro on day 9.   Since her last visit here she had a CT of the chest abdomen and pelvis for restaging purposes. This shows a continuing measurable response.  REVIEW OF SYSTEMS: Kristin Hoffman has significant fatigue related to her treatment and of course  also to her cancer and treatment related anemia. She doesn't have nausea or vomiting problems. She doesn't have mouth sores although she does have thrush at times. She has pain from the Neupogen shots, from the onpro shot, and from the  Aranesp shots. Sometimes the pain from the shots lasts a few days. Sometimes she has blurred vision. She has rare headaches. They are infrequent, mild, and controlled on Aleve. She has a little bit of a runny nose, little bit of hoarseness, and feels short of breath when doing any activity. After taking a shower she has to sit for a while. She takes naps during the day. She sleeps right through the night. She bruises easily. She feels weak and anxious. She denies depression. She has pain under the left breast and sometimes the left scapula. This happens when she twists for any reason. She started an RN pill but it gave her diarrhea so she stopped it. Sometimes she has ankle swelling. This comes and goes. A detailed review of systems today was otherwise stable  PAST MEDICaL HISTORY: Past Medical History  Diagnosis Date  . Depression   . Reflux   . Hx-TIA (transient ischemic attack) 11/22/2013  . Chronic headaches 11/22/2013  . Anxiety 11/22/2013  . Breast cancer ([place])   . Cancer ([place])   . Breast cancer metastasized to multiple sites [facility]) 12/24/2013    PAST SURGICAL HISTORY: Past Surgical History  Procedure Laterality Date  . Mastectomy w/ nodes partial    . Mastectomy    . Breast reconstruction    . Portacath placement      x 2  . Portacath removal      x 2  . Abdominal hysterectomy    . Tee without cardioversion  11/12/2012    Procedure: TRANSESOPHAGEAL ECHOCARDIOGRAM (TEE);  Surgeon: Candee Furbish, MD;  Location: [facility] ENDOSCOPY;  Service: Cardiovascular;  Laterality: N/A;  This TEE may be Dr. Marlou Porch or a LHC Dr    FAMILY HISTORY Both parents are alive and well. Patient has one sister who is 49 years younger and is in good health. No other history of breast or ovarian cancer in the family. Family History  Problem Relation Age of Onset  . Diabetes Mellitus II Neg Hx   . Hypertension Neg Hx     GYNECOLOGIC HISTORY:   (Updated January 2015) G2P2, menarche at age 14 with irregular  menses. On birth control pills in the past. On Clomid to induce ovulation with first pregnancy. Also had preeclampsia with first pregnancy. Status post hysterectomy and bilateral salpingo-oophorectomy in August 2009.  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 She is originally from [place]. She's been married to [place], for 13 years. He works as an Pharmacist, hospital at [employer].   ADVANCED DIRECTIVES:  Not in place; Not in place. At the clinic visit 09/29/2015 the patient was given the appropriate forms to complete and notarize at her discretion.    HEALTH MAINTENANCE:  (Updated 11/22/2013) Social History  Substance Use Topics  . Smoking status: Never Smoker   . Smokeless tobacco: Never Used  . Alcohol Use: Yes     Comment: rarely     Colonoscopy:  Never  PAP: S/P LAVH/BSO in August 2009  Bone density: Never  Lipid panel: Not on file   Allergies  Allergen Reactions  . Afinitor [Everolimus] Hives and Itching    Current Outpatient Prescriptions  Medication Sig Dispense Refill  . filgrastim (NEUPOGEN) 300 MCG/0.5ML SOSY injection Inject 0.5 mLs (300 mcg total) into the skin once. 6 Syringe 1  . nystatin (MYCOSTATIN) 100000 UNIT/ML suspension Take 5 mLs (500,000 Units total) by mouth 4 (four) times daily. 240 mL 1  . acidophilus (RISAQUAD) CAPS capsule Take 1 capsule by mouth every morning.     . Biotin 5000 MCG CAPS Take 5,000 mcg by mouth every morning.  30 capsule   . cetirizine (ZYRTEC) 10 MG tablet Take 10 mg by mouth at bedtime.     . Cholecalciferol 2000 UNITS CHEW Chew 1 tablet by mouth daily with breakfast.     . escitalopram (LEXAPRO) 20 MG tablet Take 1 tablet (20 mg total) by mouth daily. 30 tablet 12  . Ibuprofen (ADVIL) 200 MG CAPS Take 2 capsules (400 mg total) by mouth 3 (three) times daily with meals as needed. 120 each 0  . LORazepam (ATIVAN) 0.5 MG tablet Take 1  tablet (0.5 mg total) by mouth every 6 (six) hours as needed for anxiety. 120 tablet 1  . Melatonin 10 MG TABS Take 1 tablet by mouth at bedtime.    . montelukast (SINGULAIR) 10 MG tablet Take 10 mg by mouth daily with breakfast.     . Multiple Vitamin (MULTIVITAMIN) tablet Take 1 tablet by mouth every morning.     Marland Kitchen omeprazole (PRILOSEC) 40 MG capsule Take 40 mg by mouth every morning.     . ondansetron (ZOFRAN) 8 MG tablet TAKE 1 TABLET BY MOUTH 2 TIMES DAILY. START DAY AFTER CHEMO FOR 2 DAYS. THEN TAKE AS NEEDED NAUSEA 30 tablet 1  . prochlorperazine (COMPAZINE) 10 MG tablet Take 10 mg by mouth every 6 (six) hours as needed for nausea or vomiting.     . traMADol (ULTRAM) 50 MG tablet Take 1 tablet (50 mg total) by mouth every 6 (six) hours as needed. 120 tablet 1   No current facility-administered medications for this visit.   Facility-Administered Medications Ordered in Other Visits  Medication Dose Route Frequency Provider Last Rate Last Dose  . sodium chloride 0.9 % injection 10 mL  10 mL Intracatheter PRN Chauncey Cruel, MD   10 mL at 09/15/15 1524    OBJECTIVE: Young white woman in no acute distress Filed Vitals:   09/29/15 0817  BP: 128/79  Pulse: 96  Temp: 98.9 F (37.2 C)  Resp: 18  Body mass index is 32.27 kg/(m^2).  ECOG: 2-3 Filed Weights   09/29/15 0817  Weight: 182 lb 1.6 oz (82.6 kg)   Sclerae unicteric, pupils round and equal Oropharynx shows moderate thrush, no ulcerations No cervical or supraclavicular adenopathy Lungs no rales or rhonchi Heart regular rate and rhythm Abd soft, nontender, positive bowel sounds MSK no focal spinal tenderness, no upper extremity lymphedema; mild tenderness to palpation on the rib cage under the left breast area Neuro: nonfocal, well oriented, appropriate affect Breasts: Deferred   LAB RESULTS:   Lab Results  Component Value Date   WBC 10.6* 09/21/2015   NEUTROABS 9.7* 09/21/2015   HGB 6.4* 09/21/2015   HCT 20.6*  09/21/2015   MCV 100.5 09/21/2015   PLT 6* 09/21/2015      Chemistry      Component Value Date/Time   NA 140 09/21/2015 1032   NA 138 05/30/2015 0525   K 3.7 09/21/2015 1032   K 3.7 05/30/2015 0525   CL 104 05/30/2015 0525   CL 104 11/14/2012 1407   CO2  26 09/21/2015 1032   CO2 27 05/30/2015 0525   BUN 16.8 09/21/2015 1032   BUN 16 05/30/2015 0525   CREATININE 0.6 09/21/2015 1032   CREATININE 0.68 05/30/2015 0525      Component Value Date/Time   CALCIUM 8.2* 09/21/2015 1032   CALCIUM 8.2* 05/30/2015 0525   ALKPHOS 187* 09/21/2015 1032   ALKPHOS 88 05/30/2015 0525   AST 42* 09/21/2015 1032   AST 193* 05/30/2015 0525   ALT 36 09/21/2015 1032   ALT 76* 05/30/2015 0525   BILITOT 0.97 09/21/2015 1032   BILITOT 1.0 05/30/2015 0525     STUDIES: Ct Head W Wo Contrast  09/28/2015  CLINICAL DATA:  Restaging breast cancer. Widespread metastatic disease. EXAM: CT HEAD WITHOUT AND WITH CONTRAST TECHNIQUE: Contiguous axial images were obtained from the base of the skull through the vertex without and with intravenous contrast CONTRAST:  16m OMNIPAQUE IOHEXOL 300 MG/ML  SOLN COMPARISON:  MRI brain 08/20/2013.  Body CT reported separately. FINDINGS: No evidence for acute infarction, hemorrhage, mass lesion, hydrocephalus, or extra-axial fluid. Slight premature for age cerebral and cerebellar atrophy. Slight hypoattenuation of white matter, likely small vessel disease or post treatment effect. Stable chronic cortical and subcortical infarcts. Post infusion, no abnormal enhancement of the brain or visible meninges. No destructive calvarial lesions. Incidental Pacchionian granulations around the torcula are stable from prior MR. IMPRESSION: Premature for age atrophy. No acute intracranial findings. No visible metastatic disease to the brain, meninges, or calvarium. Electronically Signed   By: JStaci RighterM.D.   On: 09/28/2015 14:54   Ct Chest W Contrast  09/28/2015  CLINICAL DATA:   Metastatic breast cancer status post bilateral mastectomy, chemotherapy ongoing. EXAM: CT CHEST, ABDOMEN, AND PELVIS WITH CONTRAST TECHNIQUE: Multidetector CT imaging of the chest, abdomen and pelvis was performed following the standard protocol during bolus administration of intravenous contrast. CONTRAST:  1053mOMNIPAQUE IOHEXOL 300 MG/ML  SOLN COMPARISON:  07/27/2015 FINDINGS: CT CHEST FINDINGS Mediastinum/Nodes: The heart is normal in size. No pericardial effusion. Aberrant right subclavian artery. Right chest port terminates in the upper right atrium. Improving thoracic nodal metastases, including: --1.4 cm short axis left supraclavicular node (series 5/image 7), previously 2.5 cm --1.2 cm short axis necrotic high right paratracheal node (series 5/image 14), previously 1.8 cm --1.5 cm short axis necrotic left prevascular node (series 5/image 17), previously 2.3 cm Additional small calcified mediastinal nodes, benign. Status post left axillary lymph node dissection. Visualized thyroid is unremarkable. Lungs/Pleura: 1.5 x 1.5 cm necrotic pleural-based nodule along the anterior right middle lobe (series 5/image 28), previously 1.8 x 2.3 cm. Associated small right pleural effusion along the major fissure and posterior lung base. 8.1 x 2.8 cm necrotic mass along the anterior left upper lobe/chest wall (series 5/ image 20), previously 7.8 x 4.0 cm. Associated small left pleural effusion, some of which is loculated anteriorly along the left chest wall mass. Again noted is interlobular septal thickening with scattered pulmonary nodularity, worrisome for lymphangitic spread of tumor. 9 mm nodule in the anterior left upper lobe (series 7/ image 15, previously 6 mm. Otherwise, discrete nodularity is less conspicuous than on more remote prior studies. No pneumothorax. Musculoskeletal: Status post bilateral mastectomy with reconstruction. Mild degenerative changes of the thoracic spine. No focal osseous lesions. CT ABDOMEN  PELVIS FINDINGS Hepatobiliary: Vague heterogeneously enhancing lesion in the anterior right hepatic dome is difficult to discretely measure but likely measures approximately 4.5 x 4.9 cm (series 5/image 41), previously 5.1 x 5.6 cm. Lesion along  the anterior caudate measures approximately 1.6 x 1.3 cm, previously 2.5 x 1.8 cm. Additional areas of heterogeneous perfusion. Gallbladder is mildly thick-walled but underdistended. No intrahepatic or extrahepatic ductal dilatation. Pancreas: 2.7 x 1.8 cm hypoenhancing lesion in the pancreatic head (series 5/ image 57), previously 2.9 x 2.9 cm. Associated atrophy the pancreatic body/ tail. No pancreatic ductal dilatation. Spleen: Within normal limits. Adrenals/Urinary Tract: Adrenal glands are within normal limits. Kidneys are within normal limits.  No hydronephrosis. Bladder is mildly thick-walled although underdistended. Stomach/Bowel: Stomach is within normal limits. No evidence of bowel obstruction. Normal appendix (series 5/ image 88). Vascular/Lymphatic: No evidence of abdominal aortic aneurysm. Circumaortic left renal vein. Abnormal soft tissue adjacent to the caudate may reflect portacaval lymphadenopathy (series 5/ image 54) but is difficult to discretely measure. Otherwise, no suspicious abdominopelvic lymphadenopathy. Reproductive: Status post hysterectomy. No adnexal masses. Other: Small perisplenic and pelvic ascites, new. No gross peritoneal nodularity. Musculoskeletal: No focal osseous lesions. IMPRESSION: Status post bilateral mastectomy with left axillary lymph node dissection. Pleural/parenchymal pulmonary metastases with suspected lymphangitic spread, as described above, improved. Thoracic lymphadenopathy, improved. Hepatic and pancreatic metastases, improved. Small volume abdominopelvic ascites, new. No gross peritoneal disease. Electronically Signed   By: Julian Hy M.D.   On: 09/28/2015 14:55   Ct Abdomen Pelvis W Contrast  09/28/2015   CLINICAL DATA:  Metastatic breast cancer status post bilateral mastectomy, chemotherapy ongoing. EXAM: CT CHEST, ABDOMEN, AND PELVIS WITH CONTRAST TECHNIQUE: Multidetector CT imaging of the chest, abdomen and pelvis was performed following the standard protocol during bolus administration of intravenous contrast. CONTRAST:  175m OMNIPAQUE IOHEXOL 300 MG/ML  SOLN COMPARISON:  07/27/2015 FINDINGS: CT CHEST FINDINGS Mediastinum/Nodes: The heart is normal in size. No pericardial effusion. Aberrant right subclavian artery. Right chest port terminates in the upper right atrium. Improving thoracic nodal metastases, including: --1.4 cm short axis left supraclavicular node (series 5/image 7), previously 2.5 cm --1.2 cm short axis necrotic high right paratracheal node (series 5/image 14), previously 1.8 cm --1.5 cm short axis necrotic left prevascular node (series 5/image 17), previously 2.3 cm Additional small calcified mediastinal nodes, benign. Status post left axillary lymph node dissection. Visualized thyroid is unremarkable. Lungs/Pleura: 1.5 x 1.5 cm necrotic pleural-based nodule along the anterior right middle lobe (series 5/image 28), previously 1.8 x 2.3 cm. Associated small right pleural effusion along the major fissure and posterior lung base. 8.1 x 2.8 cm necrotic mass along the anterior left upper lobe/chest wall (series 5/ image 20), previously 7.8 x 4.0 cm. Associated small left pleural effusion, some of which is loculated anteriorly along the left chest wall mass. Again noted is interlobular septal thickening with scattered pulmonary nodularity, worrisome for lymphangitic spread of tumor. 9 mm nodule in the anterior left upper lobe (series 7/ image 15, previously 6 mm. Otherwise, discrete nodularity is less conspicuous than on more remote prior studies. No pneumothorax. Musculoskeletal: Status post bilateral mastectomy with reconstruction. Mild degenerative changes of the thoracic spine. No focal osseous  lesions. CT ABDOMEN PELVIS FINDINGS Hepatobiliary: Vague heterogeneously enhancing lesion in the anterior right hepatic dome is difficult to discretely measure but likely measures approximately 4.5 x 4.9 cm (series 5/image 41), previously 5.1 x 5.6 cm. Lesion along the anterior caudate measures approximately 1.6 x 1.3 cm, previously 2.5 x 1.8 cm. Additional areas of heterogeneous perfusion. Gallbladder is mildly thick-walled but underdistended. No intrahepatic or extrahepatic ductal dilatation. Pancreas: 2.7 x 1.8 cm hypoenhancing lesion in the pancreatic head (series 5/ image 57), previously 2.9 x 2.9  cm. Associated atrophy the pancreatic body/ tail. No pancreatic ductal dilatation. Spleen: Within normal limits. Adrenals/Urinary Tract: Adrenal glands are within normal limits. Kidneys are within normal limits.  No hydronephrosis. Bladder is mildly thick-walled although underdistended. Stomach/Bowel: Stomach is within normal limits. No evidence of bowel obstruction. Normal appendix (series 5/ image 88). Vascular/Lymphatic: No evidence of abdominal aortic aneurysm. Circumaortic left renal vein. Abnormal soft tissue adjacent to the caudate may reflect portacaval lymphadenopathy (series 5/ image 54) but is difficult to discretely measure. Otherwise, no suspicious abdominopelvic lymphadenopathy. Reproductive: Status post hysterectomy. No adnexal masses. Other: Small perisplenic and pelvic ascites, new. No gross peritoneal nodularity. Musculoskeletal: No focal osseous lesions. IMPRESSION: Status post bilateral mastectomy with left axillary lymph node dissection. Pleural/parenchymal pulmonary metastases with suspected lymphangitic spread, as described above, improved. Thoracic lymphadenopathy, improved. Hepatic and pancreatic metastases, improved. Small volume abdominopelvic ascites, new. No gross peritoneal disease. Electronically Signed   By: Julian Hy M.D.   On: 09/28/2015 14:55    ASSESSMENT: 49 y.o.  [place] woman  (1)  status post left lumpectomy under the care of Dr. 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 2 of 23 lymph nodes were involved.  Margins were clear.  (2) status post adjuvant chemotherapy consisting of 6 q. three-week doses of docetaxel/ doxorubicin/ cyclophosphamide given between January 2007 and may of 2007, with last dose on 04/20/2006.  (3) status post radiation therapy to the left breast, completed 07/11/2006  (4) began on tamoxifen in early August 2007 and continued until October 2009. She status post hysterectomy with bilateral salpingo-oophorectomy on 07/15/2008, and was started on letrozole in October of 2009.  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 PET and CT of the chest showed no evidence of metastasis in January 2011.  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 Margins were clear. Patient underwent concurrent left latissimus flap reconstruction and right implant reconstruction.  (7)  status post additional chemotherapy with one cycle of carboplatin/gemcitabine given on 03/11/2010. She then received 5 q. three-week cycles of IV CMF between 03/25/2010 and 06/17/2010.  (8) started on exemestane in January 2012 and continued until late November 2013 when she was hospitalized for apparent TIA at which time her exemestane was stopped and not resumed  (9) METASTATIC DISEASE: evidence of disease recurence noted on chest CT, liver MRI and PET scan in December 2014, with suspicious lung nodules, lymphadenopathy, and 3 lesions in the right  lobe of the liver, but no evidence of bony disease and no evidence of brain involvement by brain MRI September 2014. Biopsy of a left supraclavicular lymph node on 12/11/2013 confirmed metastatic carcinoma, estrogen receptor 45% positive, progesterone receptor 17% positive, with no HER-2 amplification.  (10) fulvestrant started 12/03/2013, discontinued 03/25/2014 with progression  (11) exemestane/ everolimus started 04/04/2014; everolimus held 04/14/2014, with skin rash and mouth soreness; resumed at 5 mg a day as of 04/29/2014, discontinued 05/09/2014 with similar symptoms  (12) continueing exemestane, adding palbociclib 05/26/2014;   (a) palbociclib dose dropped to 75 mg every other day for 21 days beginning with cycle 4  (b) exemestane and Palbociclib discontinued December 2015 with evidence of  progression  (13) [facility] referral placed 04/28/2049 -- re-sent 05/23/2014-- shows Pi3KCA and TP53 mutations  (a) [facility] one test requested 12/10/2014-- confirms [employer] results  (b) did not qualify for capecitabine/ BYL 719 trial because of elevated lipase  (14) started eribulin 12/26/2014, 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   (a) dpse dropped from 2g BID to 1.5g BID starting on 04/26/15  (b) stopped 05/17/2015 (after 4 cycles) with progression  (16) started carboplatin/ gemcitabine 05/26/2015, to be given day one and day 8 of each 21 day cycle  (a) given her history of severe neutropenia with this chemotherapy we'll do Neupogen days 2, 3 and 4 of each cycle and onpro day 9  (b) carbo dose dropped by 25% w cycle 2 due to very low platelet nadir  (c) CT scans after 3 cycles (07/27/2015) show measurable response  (d) CT scans after 6 cycles (09/28/2015)show continuing response  (17) Left pleural effusion\   (a) s/p L  thoracentesis 05/22/2015, 05/29/2015 and 06/05/2015  (b) attempt at pleurx 06/12/2015 cancelled as effusion loculated and scant  (18) symptomatic anemia  (a) Aranesp started 07/28/2015  (b) Feraheme given 08/04/2015  PLAN:  I spent approximately 50 minutes going over her sugars situation with her and Kristin Hoffman. She is making progress with her chemotherapy. She shows continuing measurable response. This is gaining her time. Unfortunately she does have significant side effects from the chemotherapy, with fatigue being the chief problem.  We are giving her Aranesp every 3 weeks. I'm going to check a ferritin and reticulocyte count next week and give her some more intravenous iron if necessary. We will keep her transfused above 9 but she understands that if we optimize transfusions we may also be increasing the risk of developing antibodies which may make eventual transfusions difficult.  She does have thrush, which she was not aware of. She will go back to the nystatin. If that doesn't clear it we can easily add Diflucan.  I have encouraged her to exercise even 5 or 10 minutes at a time whenever she has a little bit more energy. The point of the pain medicine, tramadol and ibuprofen, is so she can do a little bit more.  We're going to proceed to 3 more cycles of chemotherapy. After this, though, I am not sure she is going to be able to tolerate this intensity of treatment and we will have to find something she can tolerate better. Given all the treatment she has had previously, this is going to be difficult, but possibly we can move her chemotherapy treatments to every 2 weeks. In any case we will try to give her a break over the Christmas holidays.  I have gone ahead and entered all her chemotherapy orders for the next 3 cycles as well as all her visits and she will see me again December 13 with a CT scan of the chest abdomen and pelvis today before  Sherri knows to call for any problems that may  develop before her next visit here.  Chauncey Cruel, MD   09/29/2015 8:41 AM

## 2015-09-29 NOTE — Progress Notes (Signed)
Reviewed labs with [name]. Okay to proceed with treatment with 09/29/15 labs per [name].

## 2015-09-29 NOTE — Telephone Encounter (Signed)
Appointments made and avs will be printed in chemo,email to michelle to help with 10/25 chemo

## 2015-09-29 NOTE — Addendum Note (Signed)
Addended by: Raina Mina E on: 09/29/2015 10:00 AM   Modules accepted: Medications

## 2015-09-29 NOTE — Telephone Encounter (Signed)
Patient called "requesting results of CT head.  I forgot to ask when I saw [name] this morning.  Could someone bring a copy to me in the infusion room.  Call my mobile number if questions [phone].

## 2015-09-30 ENCOUNTER — Ambulatory Visit (HOSPITAL_BASED_OUTPATIENT_CLINIC_OR_DEPARTMENT_OTHER): Payer: 59

## 2015-09-30 VITALS — BP 126/74 | HR 105 | Temp 99.1°F

## 2015-09-30 DIAGNOSIS — C787 Secondary malignant neoplasm of liver and intrahepatic bile duct: Secondary | ICD-10-CM

## 2015-09-30 DIAGNOSIS — Z5189 Encounter for other specified aftercare: Secondary | ICD-10-CM

## 2015-09-30 DIAGNOSIS — C50312 Malignant neoplasm of lower-inner quadrant of left female breast: Secondary | ICD-10-CM | POA: Diagnosis not present

## 2015-09-30 DIAGNOSIS — C50919 Malignant neoplasm of unspecified site of unspecified female breast: Secondary | ICD-10-CM

## 2015-09-30 MED ORDER — TBO-FILGRASTIM 300 MCG/0.5ML ~~LOC~~ SOSY
300.0000 ug | PREFILLED_SYRINGE | Freq: Once | SUBCUTANEOUS | Status: AC
  Administered 2015-09-30: 300 ug via SUBCUTANEOUS
  Filled 2015-09-30: qty 0.5

## 2015-10-01 ENCOUNTER — Ambulatory Visit (HOSPITAL_BASED_OUTPATIENT_CLINIC_OR_DEPARTMENT_OTHER): Payer: 59

## 2015-10-01 VITALS — BP 128/79 | HR 107 | Temp 98.9°F

## 2015-10-01 DIAGNOSIS — C787 Secondary malignant neoplasm of liver and intrahepatic bile duct: Secondary | ICD-10-CM

## 2015-10-01 DIAGNOSIS — C50919 Malignant neoplasm of unspecified site of unspecified female breast: Secondary | ICD-10-CM

## 2015-10-01 DIAGNOSIS — Z5189 Encounter for other specified aftercare: Secondary | ICD-10-CM | POA: Diagnosis not present

## 2015-10-01 DIAGNOSIS — C50312 Malignant neoplasm of lower-inner quadrant of left female breast: Secondary | ICD-10-CM | POA: Diagnosis not present

## 2015-10-01 MED ORDER — TBO-FILGRASTIM 300 MCG/0.5ML ~~LOC~~ SOSY
300.0000 ug | PREFILLED_SYRINGE | Freq: Once | SUBCUTANEOUS | Status: AC
  Administered 2015-10-01: 300 ug via SUBCUTANEOUS
  Filled 2015-10-01: qty 0.5

## 2015-10-02 ENCOUNTER — Ambulatory Visit (HOSPITAL_BASED_OUTPATIENT_CLINIC_OR_DEPARTMENT_OTHER): Payer: 59

## 2015-10-02 VITALS — BP 132/77 | HR 113 | Temp 98.4°F | Resp 20

## 2015-10-02 DIAGNOSIS — C50312 Malignant neoplasm of lower-inner quadrant of left female breast: Secondary | ICD-10-CM | POA: Diagnosis not present

## 2015-10-02 DIAGNOSIS — C787 Secondary malignant neoplasm of liver and intrahepatic bile duct: Secondary | ICD-10-CM

## 2015-10-02 DIAGNOSIS — Z5189 Encounter for other specified aftercare: Secondary | ICD-10-CM

## 2015-10-02 DIAGNOSIS — C50919 Malignant neoplasm of unspecified site of unspecified female breast: Secondary | ICD-10-CM

## 2015-10-02 MED ORDER — TBO-FILGRASTIM 300 MCG/0.5ML ~~LOC~~ SOSY
300.0000 ug | PREFILLED_SYRINGE | Freq: Once | SUBCUTANEOUS | Status: AC
  Administered 2015-10-02: 300 ug via SUBCUTANEOUS
  Filled 2015-10-02: qty 0.5

## 2015-10-02 NOTE — Patient Instructions (Signed)

## 2015-10-06 ENCOUNTER — Other Ambulatory Visit (HOSPITAL_BASED_OUTPATIENT_CLINIC_OR_DEPARTMENT_OTHER): Payer: 59

## 2015-10-06 ENCOUNTER — Ambulatory Visit: Payer: 59

## 2015-10-06 ENCOUNTER — Ambulatory Visit (HOSPITAL_BASED_OUTPATIENT_CLINIC_OR_DEPARTMENT_OTHER): Payer: 59 | Admitting: Oncology

## 2015-10-06 ENCOUNTER — Other Ambulatory Visit: Payer: Self-pay | Admitting: *Deleted

## 2015-10-06 ENCOUNTER — Ambulatory Visit (HOSPITAL_BASED_OUTPATIENT_CLINIC_OR_DEPARTMENT_OTHER): Payer: 59

## 2015-10-06 ENCOUNTER — Telehealth: Payer: Self-pay | Admitting: Oncology

## 2015-10-06 VITALS — BP 125/69 | HR 97 | Temp 98.5°F | Resp 18

## 2015-10-06 VITALS — BP 129/72 | HR 98 | Temp 98.8°F | Resp 18 | Ht 63.0 in | Wt 183.9 lb

## 2015-10-06 DIAGNOSIS — D649 Anemia, unspecified: Secondary | ICD-10-CM | POA: Diagnosis not present

## 2015-10-06 DIAGNOSIS — C50919 Malignant neoplasm of unspecified site of unspecified female breast: Secondary | ICD-10-CM

## 2015-10-06 DIAGNOSIS — C50312 Malignant neoplasm of lower-inner quadrant of left female breast: Secondary | ICD-10-CM | POA: Diagnosis not present

## 2015-10-06 DIAGNOSIS — T451X5A Adverse effect of antineoplastic and immunosuppressive drugs, initial encounter: Secondary | ICD-10-CM

## 2015-10-06 DIAGNOSIS — Z95828 Presence of other vascular implants and grafts: Secondary | ICD-10-CM

## 2015-10-06 DIAGNOSIS — C770 Secondary and unspecified malignant neoplasm of lymph nodes of head, face and neck: Secondary | ICD-10-CM

## 2015-10-06 DIAGNOSIS — D630 Anemia in neoplastic disease: Secondary | ICD-10-CM

## 2015-10-06 DIAGNOSIS — D696 Thrombocytopenia, unspecified: Secondary | ICD-10-CM

## 2015-10-06 DIAGNOSIS — D61810 Antineoplastic chemotherapy induced pancytopenia: Secondary | ICD-10-CM

## 2015-10-06 LAB — COMPREHENSIVE METABOLIC PANEL (CC13)
ALBUMIN: 2.5 g/dL — AB (ref 3.5–5.0)
ALT: 44 U/L (ref 0–55)
AST: 54 U/L — AB (ref 5–34)
Alkaline Phosphatase: 138 U/L (ref 40–150)
Anion Gap: 5 mEq/L (ref 3–11)
BUN: 14.9 mg/dL (ref 7.0–26.0)
CALCIUM: 8.5 mg/dL (ref 8.4–10.4)
CHLORIDE: 108 meq/L (ref 98–109)
CO2: 27 mEq/L (ref 22–29)
CREATININE: 0.6 mg/dL (ref 0.6–1.1)
EGFR: >90 mL/min/{1.73_m2} (ref >90)
Glucose: 104 mg/dl (ref 70–140)
Potassium: 3.9 mEq/L (ref 3.5–5.1)
Sodium: 141 mEq/L (ref 136–145)
Total Bilirubin: 0.85 mg/dL (ref 0.20–1.20)
Total Protein: 4.3 g/dL — ABNORMAL LOW (ref 6.4–8.3)

## 2015-10-06 LAB — FERRITIN CHCC

## 2015-10-06 LAB — CBC & DIFF AND RETIC
BASO%: 1.2 % (ref 0.0–2.0)
BASOS ABS: 0 10*3/uL (ref 0.0–0.1)
EOS ABS: 0 10*3/uL (ref 0.0–0.5)
EOS%: 0.4 % (ref 0.0–7.0)
HEMATOCRIT: 24.7 % — AB (ref 34.8–46.6)
HEMOGLOBIN: 7.7 g/dL — AB (ref 11.6–15.9)
IMMATURE RETIC FRACT: 7 % (ref 1.60–10.00)
LYMPH%: 26.9 % (ref 14.0–49.7)
MCH: 31.7 pg (ref 25.1–34.0)
MCHC: 31.2 g/dL — ABNORMAL LOW (ref 31.5–36.0)
MCV: 101.6 fL — AB (ref 79.5–101.0)
MONO#: 0.4 10*3/uL (ref 0.1–0.9)
MONO%: 17.8 % — ABNORMAL HIGH (ref 0.0–14.0)
NEUT#: 1.3 10*3/uL — ABNORMAL LOW (ref 1.5–6.5)
NEUT%: 53.7 % (ref 38.4–76.8)
PLATELETS: 26 10*3/uL — AB (ref 145–400)
RBC: 2.43 10*6/uL — ABNORMAL LOW (ref 3.70–5.45)
RDW: 24.9 % — ABNORMAL HIGH (ref 11.2–14.5)
Retic %: <0.38 % — ABNORMAL LOW (ref 0.5–1.5)
Retic Ct Abs: 4.62 10*3/uL — ABNORMAL LOW (ref 33.70–90.70)
WBC: 2.4 10*3/uL — ABNORMAL LOW (ref 3.9–10.3)
lymph#: 0.7 10*3/uL — ABNORMAL LOW (ref 0.9–3.3)

## 2015-10-06 LAB — PREPARE RBC (CROSSMATCH)

## 2015-10-06 MED ORDER — DIPHENHYDRAMINE HCL 25 MG PO CAPS
25.0000 mg | ORAL_CAPSULE | Freq: Once | ORAL | Status: AC
  Administered 2015-10-06: 25 mg via ORAL

## 2015-10-06 MED ORDER — HEPARIN SOD (PORK) LOCK FLUSH 100 UNIT/ML IV SOLN
500.0000 [IU] | Freq: Every day | INTRAVENOUS | Status: AC | PRN
  Administered 2015-10-06: 500 [IU]
  Filled 2015-10-06: qty 5

## 2015-10-06 MED ORDER — DIPHENHYDRAMINE HCL 25 MG PO CAPS
ORAL_CAPSULE | ORAL | Status: AC
  Filled 2015-10-06: qty 1

## 2015-10-06 MED ORDER — ACETAMINOPHEN 325 MG PO TABS
650.0000 mg | ORAL_TABLET | Freq: Once | ORAL | Status: AC
  Administered 2015-10-06: 650 mg via ORAL

## 2015-10-06 MED ORDER — LORAZEPAM 2 MG/ML IJ SOLN
1.0000 mg | Freq: Once | INTRAMUSCULAR | Status: AC
  Administered 2015-10-06: 1 mg via INTRAVENOUS

## 2015-10-06 MED ORDER — SODIUM CHLORIDE 0.9 % IJ SOLN
10.0000 mL | INTRAMUSCULAR | Status: AC | PRN
  Administered 2015-10-06: 10 mL
  Filled 2015-10-06: qty 10

## 2015-10-06 MED ORDER — ONDANSETRON HCL 40 MG/20ML IJ SOLN
Freq: Once | INTRAMUSCULAR | Status: AC
  Administered 2015-10-06: 12:00:00 via INTRAVENOUS
  Filled 2015-10-06: qty 4

## 2015-10-06 MED ORDER — SODIUM CHLORIDE 0.9 % IJ SOLN
10.0000 mL | INTRAMUSCULAR | Status: DC | PRN
  Administered 2015-10-06: 10 mL via INTRAVENOUS
  Filled 2015-10-06: qty 10

## 2015-10-06 MED ORDER — LORAZEPAM 2 MG/ML IJ SOLN
INTRAMUSCULAR | Status: AC
  Filled 2015-10-06: qty 1

## 2015-10-06 MED ORDER — SODIUM CHLORIDE 0.9 % IV SOLN
250.0000 mL | Freq: Once | INTRAVENOUS | Status: AC
  Administered 2015-10-06: 250 mL via INTRAVENOUS

## 2015-10-06 MED ORDER — ACETAMINOPHEN 325 MG PO TABS
ORAL_TABLET | ORAL | Status: AC
  Filled 2015-10-06: qty 2

## 2015-10-06 NOTE — Patient Instructions (Signed)

## 2015-10-06 NOTE — Telephone Encounter (Signed)
Appointments made and added and she will get a new schedule in chemo today

## 2015-10-06 NOTE — Patient Instructions (Signed)
Blood Transfusion   A blood transfusion is a procedure that gives you donated blood through an IV tube. You may need blood because of illness, surgery, or injury. The blood may come from a donor. The blood may also be your own blood that you donated earlier.  The blood you get is made up of different types of cells. You may get:    Red blood cells. These carry oxygen and replace lost blood.    Platelets. These control bleeding.    Plasma. This helps blood to clot.  If you have a clotting disorder, you may also get other types of blood products.   BEFORE THE PROCEDURE   You may have a blood test. This finds out what type of blood you have. It also finds out what kind of blood your body will accept.    If you are going to have a planned surgery, you may donate your own blood. This is done in case you need to have a transfusion.    If you have had an allergic transfusion reaction before, you may be given medicine to help prevent a reaction. Take this medicine only as told by your doctor.   You will have your temperature, blood pressure, and pulse checked.  PROCEDURE    An IV will be started in your hand or arm.    The bag of donated blood will be attached to your IV and run into your vein.    A doctor will regularly check your temperature, blood pressure, and pulse during the procedure. This is done to find any early signs of a transfusion reaction.   If you have any signs or symptoms of a reaction, the procedure may be stopped and you may be given medicine.    When the transfusion is over, your IV will be removed.    Pressure may be applied to the IV site for a few minutes.    A bandage (dressing) will be applied.   The procedure may vary among doctors and hospitals.   AFTER THE PROCEDURE   Your blood pressure, temperature, and pulse will be checked regularly.     This information is not intended to replace advice given to you by your health care provider. Make sure you discuss any questions  you have with your health care provider.     Document Released: 02/24/2009 Document Revised: 12/19/2014 Document Reviewed: 10/08/2014  [employer] Interactive Patient Education 2016 [employer]

## 2015-10-06 NOTE — Progress Notes (Signed)
ID: Kristin Hoffman OB: 48/21/1967  MR#: [id number]  HOZ#:[id number]  PCP: Drake Leach, MD GYN:  [name] SU: [name] OTHER MD: [name], [name]  CHIEF COMPLAINT:  Stage IV breast cancer  CURRENT TREATMENT: carboplatin/ gemcitabine/ aranesp  BREAST CANCER HISTORY: This patient was previously followed by [name], and was transferred to [name] service as of 08/20/2013.  At the age of 49, the patient had a screening mammogram as a baseline. She had recently given birth and was on birth control pills at that time. The mammogram in November 2006 showed an area of architectural distortion in the left lower inner quadrant. Subsequently an ultrasound was obtained of the left breast showing a 2.0 x 1.5 x 1.4 cm mass, at the 8:00 position, 4 cm from the nipple. A core biopsy performed on 10/21/2005 showed an invasive mammary carcinoma, ER +70%, PR +41%, HER-2/neu negative, with proliferation marker of 38%. [phone])  Breast MRI on 11/08/2005 confirmed a 2.5 cm spiculated mass in the left lower inner quadrant with 3 small satellite nodules adjacent to the primary mass: 3.5 cm posterior medial to the primary mass, measuring 9 mm; 1.5 cm anterior medial to the primary mass measuring 5 mm; and 2 cm medial to the primary mass measuring 5 mm. No axillary adenopathy was noted. No suspicious masses or enhancement are noted in the right breast.  Charlayne underwentt left lumpectomy under the care of Dr. 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 2 of 23 lymph nodes were involved.  Margins were clear.  She received adjuvant chemotherapy consisting of 6 q. three-week doses of docetaxel/doxorubicin/cyclophosphamide given between January 2007 and may of 2007, with last dose on 04/20/2006. She underwent radiation therapy completed 07/11/2006, after which she began on tamoxifen in  early August 2007.  She underwent hysterectomy with bilateral salpingo-oophorectomy on 07/15/2008, and was started on letrozole in October of 2009.  A mammogram 11/10/2009 showed microcalcifications in the left breast, and a subsequent biopsy on 11/18/2009 confirmed invasive mammary carcinoma in the left breast. PET and CT of the chest showed no evidence of metastasis in January 2011.  She underwent bilateral mastectomies 01/25/2010, with the right breast showing no evidence of malignancy; in the left breast showing a 1.0 cm grade 2 invasive ductal carcinoma with high-grade DCIS. Tumor was ER +22%, PR negative, HER-2/neu negative, with proliferation marker of 13%. Margins were clear. Patient underwent concurrent left latissimus flap reconstruction and right implant reconstruction.  She received additional chemotherapy with one cycle of carboplatin/gemcitabine given on 03/11/2010. She then received 5 q. three-week cycles of IV CMF between 03/25/2010 and 06/17/2010.  She was started on exemestane in January 2012 and continued until late November 2013 when she was hospitalized for apparent TIA.   Her subsequent history is as detailed below  INTERVAL HISTORY: Kristin Hoffman returns today for follow up of her metastatic breast cancer.. Today is day 8, cycle 7 of 9 planned cycles of carboplatin and gemcitabine given on days 1 and 8 of each cycle. She receives Neupogen on days 2,  3 and 4 of each cycle and then onpro on day 9.   However her counts are quite low today and we are going to need to make some changes on her schedule  REVIEW OF SYSTEMS: Kristin Hoffman continues to experience fatigue as her worst side effects. She sleeps most through the weekends and takes naps in  the afternoon. 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 The patient's mother does combine the afternoon allowing her to take a little nap. Aside from the fatigue she was  having thrush. She was advised to start Diflucan for a few days but she never did that. The full thrush cleared anyway. She is tired with any activity including taking a shower and recently after the Neupogen she was also feeling like she had she had shin splints, while walking and around [employer]. She is a little bit hoarse, has a little bit runny nose, does have palpitations, likely secondary to her anemia, and she has a bruise in her lower leg. She is experiencing some ankle swelling. A detailed review of systems today was otherwise stable  PAST MEDICaL HISTORY: Past Medical History  Diagnosis Date  . Depression   . Reflux   . Hx-TIA (transient ischemic attack) 11/22/2013  . Chronic headaches 11/22/2013  . Anxiety 11/22/2013  . Breast cancer ([place])   . Cancer ([place])   . Breast cancer metastasized to multiple sites [facility]) 12/24/2013    PAST SURGICAL HISTORY: Past Surgical History  Procedure Laterality Date  . Mastectomy w/ nodes partial    . Mastectomy    . Breast reconstruction    . Portacath placement      x 2  . Portacath removal      x 2  . Abdominal hysterectomy    . Tee without cardioversion  11/12/2012    Procedure: TRANSESOPHAGEAL ECHOCARDIOGRAM (TEE);  Surgeon: Candee Furbish, MD;  Location: [facility] ENDOSCOPY;  Service: Cardiovascular;  Laterality: N/A;  This TEE may be Dr. Marlou Porch or a LHC Dr    FAMILY HISTORY Both parents are alive and well. Patient has one sister who is 70 years younger and is in good health. No other history of breast or ovarian cancer in the family. Family History  Problem Relation Age of Onset  . Diabetes Mellitus II Neg Hx   . Hypertension Neg Hx     GYNECOLOGIC HISTORY:   (Updated January 2015) G2P2, menarche at age 49 with irregular menses. On birth control pills in the past. On Clomid to induce ovulation with first pregnancy. Also had preeclampsia with first pregnancy. Status post hysterectomy and bilateral salpingo-oophorectomy in August 2009.  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 She is originally from [place]. She's been married to Ethel, for 13 years. He works as an Pharmacist, hospital at [employer].   ADVANCED DIRECTIVES:  Not in place. At the clinic visit 10/06/2015 the patient was given the appropriate forms to complete and notarize at her discretion.    HEALTH MAINTENANCE:  (Updated 11/22/2013) Social History  Substance Use Topics  . Smoking status: Never Smoker   . Smokeless tobacco: Never Used  . Alcohol Use: Yes     Comment: rarely     Colonoscopy:  Never  PAP: S/P LAVH/BSO in August 2009  Bone density: Never  Lipid panel: Not on file   Allergies  Allergen Reactions  . Afinitor [Everolimus] Hives and Itching    Current Outpatient Prescriptions  Medication Sig Dispense Refill  . acidophilus (RISAQUAD) CAPS capsule Take 1 capsule by mouth every morning.     . Biotin 5000 MCG CAPS Take 5,000 mcg by mouth every morning.  30 capsule   .  cetirizine (ZYRTEC) 10 MG tablet Take 10 mg by mouth at bedtime.     . Cholecalciferol 2000 UNITS CHEW Chew 1 tablet by mouth daily with breakfast.     . escitalopram (LEXAPRO) 20 MG tablet Take 1 tablet (20 mg total) by mouth daily. 30 tablet 12  . filgrastim (NEUPOGEN) 300 MCG/0.5ML SOSY injection Inject 0.5 mLs (300 mcg total) into the skin once. 6 Syringe 1  . Ibuprofen (ADVIL) 200 MG CAPS Take 2 capsules (400 mg total) by mouth 3 (three) times daily with meals as needed. 120 each 0  . LORazepam (ATIVAN) 0.5 MG tablet Take 1 tablet (0.5 mg total) by mouth every 6 (six) hours as needed for anxiety. 120 tablet 1  . Melatonin 10 MG TABS Take 1 tablet by mouth at bedtime.    . montelukast (SINGULAIR) 10 MG tablet Take 10 mg by mouth daily with breakfast.     . Multiple Vitamin (MULTIVITAMIN) tablet Take 1 tablet by mouth every morning.     . nystatin (MYCOSTATIN) 100000 UNIT/ML suspension  Take 5 mLs (500,000 Units total) by mouth 4 (four) times daily. 240 mL 1  . omeprazole (PRILOSEC) 40 MG capsule Take 40 mg by mouth every morning.     . ondansetron (ZOFRAN) 8 MG tablet TAKE 1 TABLET BY MOUTH 2 TIMES DAILY. START DAY AFTER CHEMO FOR 2 DAYS. THEN TAKE AS NEEDED NAUSEA 30 tablet 1  . prochlorperazine (COMPAZINE) 10 MG tablet Take 10 mg by mouth every 6 (six) hours as needed for nausea or vomiting.     . traMADol (ULTRAM) 50 MG tablet Take 1 tablet (50 mg total) by mouth every 6 (six) hours as needed. 120 tablet 1   No current facility-administered medications for this visit.   Facility-Administered Medications Ordered in Other Visits  Medication Dose Route Frequency Provider Last Rate Last Dose  . sodium chloride 0.9 % injection 10 mL  10 mL Intracatheter PRN Chauncey Cruel, MD   10 mL at 09/15/15 1524    OBJECTIVE: Young white woman who appears stated age 71 Vitals:   10/06/15 0851  BP: 129/72  Pulse: 98  Temp: 98.8 F (37.1 C)  Resp: 18  Body mass index is 32.58 kg/(m^2).  ECOG: 2-3 Filed Weights   10/06/15 0851  Weight: 183 lb 14.4 oz (83.416 kg)   Sclerae unicteric, EOMs intact Oropharynx clear, no thrush or other lesions No cervical or supraclavicular adenopathy Lungs no rales or rhonchi Heart regular rate and rhythm Abd soft, nontender, positive bowel sounds MSK no focal spinal tenderness, grade 1 bilateral ankle swelling Neuro: nonfocal, well oriented, appropriate affect Breasts: Deferred    LAB RESULTS:   Lab Results  Component Value Date   WBC 2.4* 10/06/2015   NEUTROABS 1.3* 10/06/2015   HGB 7.7* 10/06/2015   HCT 24.7* 10/06/2015   MCV 101.6* 10/06/2015   PLT 26* 10/06/2015      Chemistry      Component Value Date/Time   NA 143 09/29/2015 0907   NA 138 05/30/2015 0525   K 3.2* 09/29/2015 0907   K 3.7 05/30/2015 0525   CL 104 05/30/2015 0525   CL 104 11/14/2012 1407   CO2 28 09/29/2015 0907   CO2 27 05/30/2015 0525   BUN 6.3*  09/29/2015 0907   BUN 16 05/30/2015 0525   CREATININE 0.7 09/29/2015 0907   CREATININE 0.68 05/30/2015 0525      Component Value Date/Time   CALCIUM 8.0* 09/29/2015 0907   CALCIUM 8.2*  05/30/2015 0525   ALKPHOS 158* 09/29/2015 0907   ALKPHOS 88 05/30/2015 0525   AST 54* 09/29/2015 0907   AST 193* 05/30/2015 0525   ALT 39 09/29/2015 0907   ALT 76* 05/30/2015 0525   BILITOT 0.69 09/29/2015 0907   BILITOT 1.0 05/30/2015 0525     STUDIES: Ct Head W Wo Contrast  09/28/2015  CLINICAL DATA:  Restaging breast cancer. Widespread metastatic disease. EXAM: CT HEAD WITHOUT AND WITH CONTRAST TECHNIQUE: Contiguous axial images were obtained from the base of the skull through the vertex without and with intravenous contrast CONTRAST:  16m OMNIPAQUE IOHEXOL 300 MG/ML  SOLN COMPARISON:  MRI brain 08/20/2013.  Body CT reported separately. FINDINGS: No evidence for acute infarction, hemorrhage, mass lesion, hydrocephalus, or extra-axial fluid. Slight premature for age cerebral and cerebellar atrophy. Slight hypoattenuation of white matter, likely small vessel disease or post treatment effect. Stable chronic cortical and subcortical infarcts. Post infusion, no abnormal enhancement of the brain or visible meninges. No destructive calvarial lesions. Incidental Pacchionian granulations around the torcula are stable from prior MR. IMPRESSION: Premature for age atrophy. No acute intracranial findings. No visible metastatic disease to the brain, meninges, or calvarium. Electronically Signed   By: JStaci RighterM.D.   On: 09/28/2015 14:54   Ct Chest W Contrast  09/28/2015  CLINICAL DATA:  Metastatic breast cancer status post bilateral mastectomy, chemotherapy ongoing. EXAM: CT CHEST, ABDOMEN, AND PELVIS WITH CONTRAST TECHNIQUE: Multidetector CT imaging of the chest, abdomen and pelvis was performed following the standard protocol during bolus administration of intravenous contrast. CONTRAST:  1076mOMNIPAQUE IOHEXOL  300 MG/ML  SOLN COMPARISON:  07/27/2015 FINDINGS: CT CHEST FINDINGS Mediastinum/Nodes: The heart is normal in size. No pericardial effusion. Aberrant right subclavian artery. Right chest port terminates in the upper right atrium. Improving thoracic nodal metastases, including: --1.4 cm short axis left supraclavicular node (series 5/image 7), previously 2.5 cm --1.2 cm short axis necrotic high right paratracheal node (series 5/image 14), previously 1.8 cm --1.5 cm short axis necrotic left prevascular node (series 5/image 17), previously 2.3 cm Additional small calcified mediastinal nodes, benign. Status post left axillary lymph node dissection. Visualized thyroid is unremarkable. Lungs/Pleura: 1.5 x 1.5 cm necrotic pleural-based nodule along the anterior right middle lobe (series 5/image 28), previously 1.8 x 2.3 cm. Associated small right pleural effusion along the major fissure and posterior lung base. 8.1 x 2.8 cm necrotic mass along the anterior left upper lobe/chest wall (series 5/ image 20), previously 7.8 x 4.0 cm. Associated small left pleural effusion, some of which is loculated anteriorly along the left chest wall mass. Again noted is interlobular septal thickening with scattered pulmonary nodularity, worrisome for lymphangitic spread of tumor. 9 mm nodule in the anterior left upper lobe (series 7/ image 15, previously 6 mm. Otherwise, discrete nodularity is less conspicuous than on more remote prior studies. No pneumothorax. Musculoskeletal: Status post bilateral mastectomy with reconstruction. Mild degenerative changes of the thoracic spine. No focal osseous lesions. CT ABDOMEN PELVIS FINDINGS Hepatobiliary: Vague heterogeneously enhancing lesion in the anterior right hepatic dome is difficult to discretely measure but likely measures approximately 4.5 x 4.9 cm (series 5/image 41), previously 5.1 x 5.6 cm. Lesion along the anterior caudate measures approximately 1.6 x 1.3 cm, previously 2.5 x 1.8 cm.  Additional areas of heterogeneous perfusion. Gallbladder is mildly thick-walled but underdistended. No intrahepatic or extrahepatic ductal dilatation. Pancreas: 2.7 x 1.8 cm hypoenhancing lesion in the pancreatic head (series 5/ image 57), previously 2.9 x 2.9 cm.  Associated atrophy the pancreatic body/ tail. No pancreatic ductal dilatation. Spleen: Within normal limits. Adrenals/Urinary Tract: Adrenal glands are within normal limits. Kidneys are within normal limits.  No hydronephrosis. Bladder is mildly thick-walled although underdistended. Stomach/Bowel: Stomach is within normal limits. No evidence of bowel obstruction. Normal appendix (series 5/ image 88). Vascular/Lymphatic: No evidence of abdominal aortic aneurysm. Circumaortic left renal vein. Abnormal soft tissue adjacent to the caudate may reflect portacaval lymphadenopathy (series 5/ image 54) but is difficult to discretely measure. Otherwise, no suspicious abdominopelvic lymphadenopathy. Reproductive: Status post hysterectomy. No adnexal masses. Other: Small perisplenic and pelvic ascites, new. No gross peritoneal nodularity. Musculoskeletal: No focal osseous lesions. IMPRESSION: Status post bilateral mastectomy with left axillary lymph node dissection. Pleural/parenchymal pulmonary metastases with suspected lymphangitic spread, as described above, improved. Thoracic lymphadenopathy, improved. Hepatic and pancreatic metastases, improved. Small volume abdominopelvic ascites, new. No gross peritoneal disease. Electronically Signed   By: Julian Hy M.D.   On: 09/28/2015 14:55   Ct Abdomen Pelvis W Contrast  09/28/2015  CLINICAL DATA:  Metastatic breast cancer status post bilateral mastectomy, chemotherapy ongoing. EXAM: CT CHEST, ABDOMEN, AND PELVIS WITH CONTRAST TECHNIQUE: Multidetector CT imaging of the chest, abdomen and pelvis was performed following the standard protocol during bolus administration of intravenous contrast. CONTRAST:  133m  OMNIPAQUE IOHEXOL 300 MG/ML  SOLN COMPARISON:  07/27/2015 FINDINGS: CT CHEST FINDINGS Mediastinum/Nodes: The heart is normal in size. No pericardial effusion. Aberrant right subclavian artery. Right chest port terminates in the upper right atrium. Improving thoracic nodal metastases, including: --1.4 cm short axis left supraclavicular node (series 5/image 7), previously 2.5 cm --1.2 cm short axis necrotic high right paratracheal node (series 5/image 14), previously 1.8 cm --1.5 cm short axis necrotic left prevascular node (series 5/image 17), previously 2.3 cm Additional small calcified mediastinal nodes, benign. Status post left axillary lymph node dissection. Visualized thyroid is unremarkable. Lungs/Pleura: 1.5 x 1.5 cm necrotic pleural-based nodule along the anterior right middle lobe (series 5/image 28), previously 1.8 x 2.3 cm. Associated small right pleural effusion along the major fissure and posterior lung base. 8.1 x 2.8 cm necrotic mass along the anterior left upper lobe/chest wall (series 5/ image 20), previously 7.8 x 4.0 cm. Associated small left pleural effusion, some of which is loculated anteriorly along the left chest wall mass. Again noted is interlobular septal thickening with scattered pulmonary nodularity, worrisome for lymphangitic spread of tumor. 9 mm nodule in the anterior left upper lobe (series 7/ image 15, previously 6 mm. Otherwise, discrete nodularity is less conspicuous than on more remote prior studies. No pneumothorax. Musculoskeletal: Status post bilateral mastectomy with reconstruction. Mild degenerative changes of the thoracic spine. No focal osseous lesions. CT ABDOMEN PELVIS FINDINGS Hepatobiliary: Vague heterogeneously enhancing lesion in the anterior right hepatic dome is difficult to discretely measure but likely measures approximately 4.5 x 4.9 cm (series 5/image 41), previously 5.1 x 5.6 cm. Lesion along the anterior caudate measures approximately 1.6 x 1.3 cm, previously  2.5 x 1.8 cm. Additional areas of heterogeneous perfusion. Gallbladder is mildly thick-walled but underdistended. No intrahepatic or extrahepatic ductal dilatation. Pancreas: 2.7 x 1.8 cm hypoenhancing lesion in the pancreatic head (series 5/ image 57), previously 2.9 x 2.9 cm. Associated atrophy the pancreatic body/ tail. No pancreatic ductal dilatation. Spleen: Within normal limits. Adrenals/Urinary Tract: Adrenal glands are within normal limits. Kidneys are within normal limits.  No hydronephrosis. Bladder is mildly thick-walled although underdistended. Stomach/Bowel: Stomach is within normal limits. No evidence of bowel obstruction. Normal appendix (series  5/ image 88). Vascular/Lymphatic: No evidence of abdominal aortic aneurysm. Circumaortic left renal vein. Abnormal soft tissue adjacent to the caudate may reflect portacaval lymphadenopathy (series 5/ image 54) but is difficult to discretely measure. Otherwise, no suspicious abdominopelvic lymphadenopathy. Reproductive: Status post hysterectomy. No adnexal masses. Other: Small perisplenic and pelvic ascites, new. No gross peritoneal nodularity. Musculoskeletal: No focal osseous lesions. IMPRESSION: Status post bilateral mastectomy with left axillary lymph node dissection. Pleural/parenchymal pulmonary metastases with suspected lymphangitic spread, as described above, improved. Thoracic lymphadenopathy, improved. Hepatic and pancreatic metastases, improved. Small volume abdominopelvic ascites, new. No gross peritoneal disease. Electronically Signed   By: Julian Hy M.D.   On: 09/28/2015 14:55    ASSESSMENT: 49 y.o. [place] woman  (1)  status post left lumpectomy under the care of Dr. 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 2 of 23 lymph nodes were involved.  Margins were clear.  (2) status post adjuvant chemotherapy consisting of 6 q. three-week  doses of docetaxel/ doxorubicin/ cyclophosphamide given between January 2007 and may of 2007, with last dose on 04/20/2006.  (3) status post radiation therapy to the left breast, completed 07/11/2006  (4) began on tamoxifen in early August 2007 and continued until October 2009. She status post hysterectomy with bilateral salpingo-oophorectomy on 07/15/2008, and was started on letrozole in October of 2009.  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 PET and CT of the chest showed no evidence of metastasis in January 2011.  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 Margins were clear. Patient underwent concurrent left latissimus flap reconstruction and right implant reconstruction.  (7)  status post additional chemotherapy with one cycle of carboplatin/gemcitabine given on 03/11/2010. She then received 5 q. three-week cycles of IV CMF between 03/25/2010 and 06/17/2010.  (8) started on exemestane in January 2012 and continued until late November 2013 when she was hospitalized for apparent TIA at which time her exemestane was stopped and not resumed  (9) METASTATIC DISEASE: evidence of disease recurence noted on chest CT, liver MRI and PET scan in December 2014, with suspicious lung nodules, lymphadenopathy, and 3 lesions in the right lobe of the liver, but no evidence of bony disease and no evidence of brain involvement by brain MRI September 2014. Biopsy of a left supraclavicular lymph node on 12/11/2013 confirmed metastatic carcinoma, estrogen receptor 45% positive, progesterone receptor 17% positive, with no HER-2 amplification.  (10) fulvestrant started 12/03/2013,  discontinued 03/25/2014 with progression  (11) exemestane/ everolimus started 04/04/2014; everolimus held 04/14/2014, with skin rash and mouth soreness; resumed at 5 mg a day as of 04/29/2014, discontinued 05/09/2014 with similar symptoms  (12) continueing exemestane, adding palbociclib 05/26/2014;   (a) palbociclib dose dropped to 75 mg every other day for 21 days beginning with cycle 4  (b) exemestane and Palbociclib discontinued December 2015 with evidence of progression  (13) [facility] referral placed 04/28/2049 -- re-sent 05/23/2014-- shows Pi3KCA and TP53 mutations  (a) [facility] one test requested 12/10/2014-- confirms [facility] results  (b) did not qualify for capecitabine/ BYL 719 trial because of elevated lipase  (14) started eribulin 12/26/2014, stopped 01/23/2015 after 2 cycles because of  neuropathy; repeat liver MRI 02/25/2015 shows progression   (15) capecitabine started 03/23/2015, initially 2 weeks 1 week off, then every other week starting with cycle 2.   (a) dpse dropped from 2g BID to 1.5g BID starting on 04/26/15  (b) stopped 05/17/2015 (after 4 cycles) with progression  (16) started carboplatin/ gemcitabine 05/26/2015, to be given day one and day 8 of each 21 day cycle  (a) given her history of severe neutropenia with this chemotherapy we'll do Neupogen days 2, 3 and 4 of each cycle and onpro day 9  (b) carbo dose dropped by 25% w cycle 2 due to very low platelet nadir  (c) CT scans after 3 cycles (07/27/2015) show measurable response  (d) CT scans after 6 cycles (09/28/2015)show continuing response  (17) Left pleural effusion\   (a) s/p L thoracentesis 05/22/2015, 05/29/2015 and 06/05/2015  (b) attempt at pleurx 06/12/2015 cancelled as effusion loculated and scant  (18) symptomatic anemia  (a) Aranesp started 07/28/2015  (b) Feraheme given 08/04/2015  PLAN:  Judeen Hammans is not only significantly anemic, requiring transfusion, but also thrombocytopenic. I really think are  going to need to move her treatments to every 2 weeks instead of day 1 and day 8. We discussed that today.  She will be transfused both platelets and red cells today. She is going to see me again November 1, and hopefully we will be able to get her chemotherapy dose in at that time. If that works I will change her subsequent appointments to every 2 weeks. That means she would be treated November 1, November 15 and November 29, which works well as far as the holidays is concerned.  We have a ferritin and reticulocyte pending and she may need our in supplementation. She is not tolerating the oral iron at this point. We also may need to increase the Aranesp dose.  She requests a nutrition consult. I think this is a good idea in tenderness over her overall fitness and especially since she is having ankle swelling the idea of cutting back on sodium. However she understands changing diet will not make a difference as sparsely cancer is concerned  She has a good understanding of this plan. She agrees with it. She knows to call for any problems that may develop before her next visit here.  Chauncey Cruel, MD   10/06/2015 9:00 AM

## 2015-10-06 NOTE — Progress Notes (Signed)
Type and cross drawn from [facility] for blood product today 10/06/15. Pt left accessed and arrived to infusion.

## 2015-10-07 LAB — TYPE AND SCREEN
ABO/RH(D): A POS
ANTIBODY SCREEN: NEGATIVE
Unit division: 0
Unit division: 0

## 2015-10-07 LAB — PREPARE PLATELET PHERESIS: UNIT DIVISION: 0

## 2015-10-13 ENCOUNTER — Ambulatory Visit: Payer: 59

## 2015-10-13 ENCOUNTER — Ambulatory Visit (HOSPITAL_BASED_OUTPATIENT_CLINIC_OR_DEPARTMENT_OTHER): Payer: 59

## 2015-10-13 ENCOUNTER — Ambulatory Visit (HOSPITAL_BASED_OUTPATIENT_CLINIC_OR_DEPARTMENT_OTHER): Payer: 59 | Admitting: Oncology

## 2015-10-13 ENCOUNTER — Ambulatory Visit: Payer: 59 | Admitting: Nutrition

## 2015-10-13 ENCOUNTER — Other Ambulatory Visit: Payer: 59

## 2015-10-13 ENCOUNTER — Other Ambulatory Visit (HOSPITAL_BASED_OUTPATIENT_CLINIC_OR_DEPARTMENT_OTHER): Payer: 59

## 2015-10-13 VITALS — BP 148/81 | HR 87 | Temp 99.2°F | Resp 18 | Ht 63.0 in | Wt 182.1 lb

## 2015-10-13 DIAGNOSIS — C50312 Malignant neoplasm of lower-inner quadrant of left female breast: Secondary | ICD-10-CM | POA: Diagnosis not present

## 2015-10-13 DIAGNOSIS — Z5111 Encounter for antineoplastic chemotherapy: Secondary | ICD-10-CM

## 2015-10-13 DIAGNOSIS — C787 Secondary malignant neoplasm of liver and intrahepatic bile duct: Secondary | ICD-10-CM | POA: Diagnosis not present

## 2015-10-13 DIAGNOSIS — Z5189 Encounter for other specified aftercare: Secondary | ICD-10-CM | POA: Diagnosis not present

## 2015-10-13 DIAGNOSIS — C50912 Malignant neoplasm of unspecified site of left female breast: Secondary | ICD-10-CM

## 2015-10-13 DIAGNOSIS — C50919 Malignant neoplasm of unspecified site of unspecified female breast: Secondary | ICD-10-CM

## 2015-10-13 DIAGNOSIS — Z95828 Presence of other vascular implants and grafts: Secondary | ICD-10-CM

## 2015-10-13 DIAGNOSIS — D630 Anemia in neoplastic disease: Secondary | ICD-10-CM | POA: Diagnosis not present

## 2015-10-13 LAB — CBC WITH DIFFERENTIAL/PLATELET
BASO%: 0.3 % (ref 0.0–2.0)
BASOS ABS: 0 10*3/uL (ref 0.0–0.1)
EOS ABS: 0 10*3/uL (ref 0.0–0.5)
EOS%: 1.5 % (ref 0.0–7.0)
HEMATOCRIT: 28 % — AB (ref 34.8–46.6)
HGB: 9.1 g/dL — ABNORMAL LOW (ref 11.6–15.9)
LYMPH#: 0.4 10*3/uL — AB (ref 0.9–3.3)
LYMPH%: 21.4 % (ref 14.0–49.7)
MCH: 31.5 pg (ref 25.1–34.0)
MCHC: 32.4 g/dL (ref 31.5–36.0)
MCV: 97.3 fL (ref 79.5–101.0)
MONO#: 0.4 10*3/uL (ref 0.1–0.9)
MONO%: 22.9 % — ABNORMAL HIGH (ref 0.0–14.0)
NEUT#: 1 10*3/uL — ABNORMAL LOW (ref 1.5–6.5)
NEUT%: 53.9 % (ref 38.4–76.8)
PLATELETS: 58 10*3/uL — AB (ref 145–400)
RBC: 2.88 10*6/uL — ABNORMAL LOW (ref 3.70–5.45)
RDW: 24.8 % — ABNORMAL HIGH (ref 11.2–14.5)
WBC: 1.9 10*3/uL — ABNORMAL LOW (ref 3.9–10.3)

## 2015-10-13 LAB — COMPREHENSIVE METABOLIC PANEL (CC13)
ALT: 34 U/L (ref 0–55)
ANION GAP: 6 meq/L (ref 3–11)
AST: 48 U/L — ABNORMAL HIGH (ref 5–34)
Albumin: 2.8 g/dL — ABNORMAL LOW (ref 3.5–5.0)
Alkaline Phosphatase: 136 U/L (ref 40–150)
BILIRUBIN TOTAL: 1.02 mg/dL (ref 0.20–1.20)
BUN: 11.6 mg/dL (ref 7.0–26.0)
CALCIUM: 8.5 mg/dL (ref 8.4–10.4)
CHLORIDE: 108 meq/L (ref 98–109)
CO2: 28 mEq/L (ref 22–29)
CREATININE: 0.6 mg/dL (ref 0.6–1.1)
EGFR: >90 mL/min/{1.73_m2} (ref >90)
Glucose: 86 mg/dl (ref 70–140)
Potassium: 3.7 mEq/L (ref 3.5–5.1)
Sodium: 141 mEq/L (ref 136–145)
Total Protein: 4.8 g/dL — ABNORMAL LOW (ref 6.4–8.3)

## 2015-10-13 MED ORDER — SODIUM CHLORIDE 0.9 % IV SOLN
Freq: Once | INTRAVENOUS | Status: AC
  Administered 2015-10-13: 11:00:00 via INTRAVENOUS

## 2015-10-13 MED ORDER — SODIUM CHLORIDE 0.9 % IJ SOLN
10.0000 mL | INTRAMUSCULAR | Status: DC | PRN
  Administered 2015-10-13: 10 mL
  Filled 2015-10-13: qty 10

## 2015-10-13 MED ORDER — SODIUM CHLORIDE 0.9 % IV SOLN
Freq: Once | INTRAVENOUS | Status: AC
  Administered 2015-10-13: 13:00:00 via INTRAVENOUS
  Filled 2015-10-13: qty 4

## 2015-10-13 MED ORDER — CARBOPLATIN CHEMO INTRADERMAL TEST DOSE 100MCG/0.02ML
100.0000 ug | Freq: Once | INTRADERMAL | Status: AC
  Administered 2015-10-13: 100 ug via INTRADERMAL
  Filled 2015-10-13: qty 0.01

## 2015-10-13 MED ORDER — SODIUM CHLORIDE 0.9 % IV SOLN
800.0000 mg/m2 | Freq: Once | INTRAVENOUS | Status: AC
  Administered 2015-10-13: 1520 mg via INTRAVENOUS
  Filled 2015-10-13: qty 39.98

## 2015-10-13 MED ORDER — SODIUM CHLORIDE 0.9 % IV SOLN
175.1100 mg | Freq: Once | INTRAVENOUS | Status: AC
  Administered 2015-10-13: 180 mg via INTRAVENOUS
  Filled 2015-10-13: qty 18

## 2015-10-13 MED ORDER — SODIUM CHLORIDE 0.9 % IJ SOLN
10.0000 mL | INTRAMUSCULAR | Status: DC | PRN
  Administered 2015-10-13: 10 mL via INTRAVENOUS
  Filled 2015-10-13: qty 10

## 2015-10-13 MED ORDER — HEPARIN SOD (PORK) LOCK FLUSH 100 UNIT/ML IV SOLN
500.0000 [IU] | Freq: Once | INTRAVENOUS | Status: AC | PRN
  Administered 2015-10-13: 500 [IU]
  Filled 2015-10-13: qty 5

## 2015-10-13 MED ORDER — DARBEPOETIN ALFA 500 MCG/ML IJ SOSY
500.0000 ug | PREFILLED_SYRINGE | Freq: Once | INTRAMUSCULAR | Status: AC
  Administered 2015-10-13: 500 ug via SUBCUTANEOUS
  Filled 2015-10-13: qty 1

## 2015-10-13 MED ORDER — PEGFILGRASTIM 6 MG/0.6ML ~~LOC~~ PSKT
6.0000 mg | PREFILLED_SYRINGE | Freq: Once | SUBCUTANEOUS | Status: AC
  Administered 2015-10-13: 6 mg via SUBCUTANEOUS
  Filled 2015-10-13: qty 0.6

## 2015-10-13 NOTE — Progress Notes (Signed)
49 year old female diagnosed with stage IV breast cancer.  She is a patient of [name].  Past medical history includes depression, reflux, and anxiety.  Medications include Ativan, multivitamin, Prilosec, Zofran, and Compazine.  Labs include albumin 2.5 on October 25.  Height: 63 inches. Weight: 182.1 pounds. Usual body weight: 195 pounds January 2016. BMI: 32.27.  Patient reports she would like to try to eat a healthier diet. States physician told her it would not make much of a difference in outcome. Patient has history of anemia but is not taking iron supplements. Patient also reports some lower ankle edema. Patient denies other nutrition impact symptoms.  Nutrition diagnosis: Food and nutrition related knowledge deficit related to breast cancer and associated treatments as evidenced by no prior need for nutrition related information.  Intervention: I educated patient on high iron food sources and strategies for enhancing absorption as well as foods that decreased iron absorption. Fact sheet was provided. Educated patient on low-sodium diet to improve edema and provided a fact sheet Encouraged patient to consume adequate protein and increase fruits and that vegetables as well as other plant-based foods for improved quality-of-life. Education provided on ways to reduce fatigue. Questions were answered.  Teach back method was used. Patient was appreciative of nutrition information.  Monitoring, evaluation, goals: Patient will tolerate a healthy plant-based diet that is low in sodium to improve edema and quality-of-life.  Next visit: Patient can call me for any questions or concerns.  **Disclaimer: This note was dictated with voice recognition software. Similar sounding words can inadvertently be transcribed and this note may contain transcription errors which may not have been corrected upon publication of note.**

## 2015-10-13 NOTE — Patient Instructions (Signed)

## 2015-10-13 NOTE — Progress Notes (Signed)
Pt in for [facility] access for labs. Pt accessed, flushes well, great blood return. Pt denies any complaints or concerns. Some bleeding notices from the needle insertion. No appearance of leaking when flushing with saline. Pt denies any blood thinners at this time but states she has excessive bleeding when her platelet count is abnormal. Called and informed John,RN in infusion and Val, RN with Dr Jana Hakim to double check line for leaking.

## 2015-10-13 NOTE — Progress Notes (Signed)
ID: Kristin Hoffman OB: [birth year]  MR#: [id number]  ZES#:[id number]  PCP: Drake Leach, MD GYN:  [name] SU: [name] OTHER MD: [name], [name]  CHIEF COMPLAINT:  Stage IV breast cancer  CURRENT TREATMENT: carboplatin/ gemcitabine/ aranesp  BREAST CANCER HISTORY: This patient was previously followed by [name], and was transferred to [name] service as of 08/20/2013.  At the age of 49, the patient had a screening mammogram as a baseline. She had recently given birth and was on birth control pills at that time. The mammogram in November 2006 showed an area of architectural distortion in the left lower inner quadrant. Subsequently an ultrasound was obtained of the left breast showing a 2.0 x 1.5 x 1.4 cm mass, at the 8:00 position, 4 cm from the nipple. A core biopsy performed on 10/21/2005 showed an invasive mammary carcinoma, ER +70%, PR +41%, HER-2/neu negative, with proliferation marker of 38%. [phone])  Breast MRI on 11/08/2005 confirmed a 2.5 cm spiculated mass in the left lower inner quadrant with 3 small satellite nodules adjacent to the primary mass: 3.5 cm posterior medial to the primary mass, measuring 9 mm; 1.5 cm anterior medial to the primary mass measuring 5 mm; and 2 cm medial to the primary mass measuring 5 mm. No axillary adenopathy was noted. No suspicious masses or enhancement are noted in the right breast.  Malissa underwentt left lumpectomy under the care of Dr. 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 2 of 23 lymph nodes were involved.  Margins were clear.  She received adjuvant chemotherapy consisting of 6 q. three-week doses of docetaxel/doxorubicin/cyclophosphamide given between January 2007 and may of 2007, with last dose on 04/20/2006. She underwent radiation therapy completed 07/11/2006, after which she began on tamoxifen in  early August 2007.  She underwent hysterectomy with bilateral salpingo-oophorectomy on 07/15/2008, and was started on letrozole in October of 2009.  A mammogram 11/10/2009 showed microcalcifications in the left breast, and a subsequent biopsy on 11/18/2009 confirmed invasive mammary carcinoma in the left breast. PET and CT of the chest showed no evidence of metastasis in January 2011.  She underwent bilateral mastectomies 01/25/2010, with the right breast showing no evidence of malignancy; in the left breast showing a 1.0 cm grade 2 invasive ductal carcinoma with high-grade DCIS. Tumor was ER +22%, PR negative, HER-2/neu negative, with proliferation marker of 13%. Margins were clear. Patient underwent concurrent left latissimus flap reconstruction and right implant reconstruction.  She received additional chemotherapy with one cycle of carboplatin/gemcitabine given on 03/11/2010. She then received 5 q. three-week cycles of IV CMF between 03/25/2010 and 06/17/2010.  She was started on exemestane in January 2012 and continued until late November 2013 when she was hospitalized for apparent TIA.   Her subsequent history is as detailed below  INTERVAL HISTORY: Kristin Hoffman returns today for follow up of her metastatic breast cancer.. Today is day 1, cycle 7 of carboplatin and gemcitabine given originally on days 1 and 8 of each cycle. As of today of though we are changing the dose to every 2 weeks because her counts simply do not recover fast enough despite growth factors. She will receive onpro with each treatment, but she will not receive Neupogen anymore since the treatments have been moved to every 2 weeks.   REVIEW OF SYSTEMS: Kristin Hoffman had a blood transfusion last week and she had a little bit  more energy after that. She has had no bleeding except today after her port was accessed there was a little bit of blood oozing from the access hole. The needle however appears to be in good position. This will be rechecked  before the start of treatment. She continues to sleep poorly, she has pain under the left breast in the rib cage whenever she turns to wipe out a bowel movement and she also has left scapular pain, neither which is more frequent or intense than before. She has a little bit of a runny nose and some hoarseness, and she is chronically short of breath but not more so than recently. Some days she has 4 or 5 loose bowel movements. Other days bowel movements are normal. She has had a couple location as were he was a little bit uncomfortable to start urination, but she saw no blood and has had no urgency or frequency. She bruises easily. She feels very forgetful. She takes naps most days. There has been no intercurrent fever however. There have been no unusual headaches, or nausea. She did have an episode of vomiting while receiving platelets. It "came without warning". She thinks she is can I need reading glasses. A detailed review of systems today was otherwise stable  PAST MEDICaL HISTORY: Past Medical History  Diagnosis Date  . Depression   . Reflux   . Hx-TIA (transient ischemic attack) 11/22/2013  . Chronic headaches 11/22/2013  . Anxiety 11/22/2013  . Breast cancer ([place])   . Cancer ([place])   . Breast cancer metastasized to multiple sites [facility]) 12/24/2013    PAST SURGICAL HISTORY: Past Surgical History  Procedure Laterality Date  . Mastectomy w/ nodes partial    . Mastectomy    . Breast reconstruction    . Portacath placement      x 2  . Portacath removal      x 2  . Abdominal hysterectomy    . Tee without cardioversion  11/12/2012    Procedure: TRANSESOPHAGEAL ECHOCARDIOGRAM (TEE);  Surgeon: Candee Furbish, MD;  Location: [facility] ENDOSCOPY;  Service: Cardiovascular;  Laterality: N/A;  This TEE may be Dr. Marlou Porch or a LHC Dr    FAMILY HISTORY Both parents are alive and well. Patient has one sister who is 75 years younger and is in good health. No other history of breast or ovarian cancer in the  family. Family History  Problem Relation Age of Onset  . Diabetes Mellitus II Neg Hx   . Hypertension Neg Hx     GYNECOLOGIC HISTORY:   (Updated January 2015) G2P2, menarche at age 22 with irregular menses. On birth control pills in the past. On Clomid to induce ovulation with first pregnancy. Also had preeclampsia with first pregnancy. Status post hysterectomy and bilateral salpingo-oophorectomy in August 2009.  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 She is originally from [place]. She's been married to [place], for 13 years. He works as an Pharmacist, hospital at [employer].   ADVANCED DIRECTIVES:  Not in place. At the clinic visit 10/13/2015 the patient was given the appropriate forms to complete and notarize at her discretion.    HEALTH MAINTENANCE:  (Updated 11/22/2013) Social History  Substance Use Topics  . Smoking status: Never Smoker   . Smokeless tobacco: Never Used  . Alcohol Use: Yes     Comment: rarely     Colonoscopy:  Never  PAP: S/P LAVH/BSO in August 2009  Bone density: Never  Lipid panel: Not on file   Allergies  Allergen Reactions  . Afinitor [Everolimus] Hives and Itching    Current Outpatient Prescriptions  Medication Sig Dispense Refill  . acidophilus (RISAQUAD) CAPS capsule Take 1 capsule by mouth every morning.     . Biotin 5000 MCG CAPS Take 5,000 mcg by mouth every morning.  30 capsule   . cetirizine (ZYRTEC) 10 MG tablet Take 10 mg by mouth at bedtime.     . Cholecalciferol 2000 UNITS CHEW Chew 1 tablet by mouth daily with breakfast.     . escitalopram (LEXAPRO) 20 MG tablet Take 1 tablet (20 mg total) by mouth daily. 30 tablet 12  . filgrastim (NEUPOGEN) 300 MCG/0.5ML SOSY injection Inject 0.5 mLs (300 mcg total) into the skin once. 6 Syringe 1  . Ibuprofen (ADVIL) 200 MG CAPS Take 2 capsules (400 mg total) by mouth 3 (three) times daily with meals as  needed. 120 each 0  . LORazepam (ATIVAN) 0.5 MG tablet Take 1 tablet (0.5 mg total) by mouth every 6 (six) hours as needed for anxiety. 120 tablet 1  . Melatonin 10 MG TABS Take 1 tablet by mouth at bedtime.    . montelukast (SINGULAIR) 10 MG tablet Take 10 mg by mouth daily with breakfast.     . Multiple Vitamin (MULTIVITAMIN) tablet Take 1 tablet by mouth every morning.     . nystatin (MYCOSTATIN) 100000 UNIT/ML suspension Take 5 mLs (500,000 Units total) by mouth 4 (four) times daily. 240 mL 1  . omeprazole (PRILOSEC) 40 MG capsule Take 40 mg by mouth every morning.     . ondansetron (ZOFRAN) 8 MG tablet TAKE 1 TABLET BY MOUTH 2 TIMES DAILY. START DAY AFTER CHEMO FOR 2 DAYS. THEN TAKE AS NEEDED NAUSEA 30 tablet 1  . prochlorperazine (COMPAZINE) 10 MG tablet Take 10 mg by mouth every 6 (six) hours as needed for nausea or vomiting.     . traMADol (ULTRAM) 50 MG tablet Take 1 tablet (50 mg total) by mouth every 6 (six) hours as needed. 120 tablet 1   No current facility-administered medications for this visit.   Facility-Administered Medications Ordered in Other Visits  Medication Dose Route Frequency Provider Last Rate Last Dose  . sodium chloride 0.9 % injection 10 mL  10 mL Intracatheter PRN Chauncey Cruel, MD   10 mL at 09/15/15 1524  . sodium chloride 0.9 % injection 10 mL  10 mL Intravenous PRN Chauncey Cruel, MD   10 mL at 10/13/15 4098    OBJECTIVE: Young white woman in no acute distress Filed Vitals:   10/13/15 1012  BP: 148/81  Pulse: 87  Temp: 99.2 F (37.3 C)  Resp: 18  Body mass index is 32.27 kg/(m^2).  ECOG: 2 Filed Weights   10/13/15 1012  Weight: 182 lb 1.6 oz (82.6 kg)   Sclerae unicteric, pupils round and equal Oropharynx shows mild thrush No cervical or supraclavicular adenopathy Lungs no rales or rhonchi Heart regular rate and rhythm Abd soft, nontender, positive bowel sounds MSK no focal spinal tenderness, minimal bilateral ankle edema Neuro:  nonfocal, well oriented, appropriate affect Breasts: Deferred Skin: The port is accessed. It appears well anchored. There is a little bit of blood in the pocket under the dressing. There is no tenderness to palpation and no swelling   LAB RESULTS:   Lab Results  Component Value Date   WBC 1.9* 10/13/2015  NEUTROABS 1.0* 10/13/2015   HGB 9.1* 10/13/2015   HCT 28.0* 10/13/2015   MCV 97.3 10/13/2015   PLT 58* 10/13/2015      Chemistry      Component Value Date/Time   NA 141 10/06/2015 0831   NA 138 05/30/2015 0525   K 3.9 10/06/2015 0831   K 3.7 05/30/2015 0525   CL 104 05/30/2015 0525   CL 104 11/14/2012 1407   CO2 27 10/06/2015 0831   CO2 27 05/30/2015 0525   BUN 14.9 10/06/2015 0831   BUN 16 05/30/2015 0525   CREATININE 0.6 10/06/2015 0831   CREATININE 0.68 05/30/2015 0525      Component Value Date/Time   CALCIUM 8.5 10/06/2015 0831   CALCIUM 8.2* 05/30/2015 0525   ALKPHOS 138 10/06/2015 0831   ALKPHOS 88 05/30/2015 0525   AST 54* 10/06/2015 0831   AST 193* 05/30/2015 0525   ALT 44 10/06/2015 0831   ALT 76* 05/30/2015 0525   BILITOT 0.85 10/06/2015 0831   BILITOT 1.0 05/30/2015 0525     STUDIES: Ct Head W Wo Contrast  09/28/2015  CLINICAL DATA:  Restaging breast cancer. Widespread metastatic disease. EXAM: CT HEAD WITHOUT AND WITH CONTRAST TECHNIQUE: Contiguous axial images were obtained from the base of the skull through the vertex without and with intravenous contrast CONTRAST:  14mL OMNIPAQUE IOHEXOL 300 MG/ML  SOLN COMPARISON:  MRI brain 08/20/2013.  Body CT reported separately. FINDINGS: No evidence for acute infarction, hemorrhage, mass lesion, hydrocephalus, or extra-axial fluid. Slight premature for age cerebral and cerebellar atrophy. Slight hypoattenuation of white matter, likely small vessel disease or post treatment effect. Stable chronic cortical and subcortical infarcts. Post infusion, no abnormal enhancement of the brain or visible meninges. No  destructive calvarial lesions. Incidental Pacchionian granulations around the torcula are stable from prior MR. IMPRESSION: Premature for age atrophy. No acute intracranial findings. No visible metastatic disease to the brain, meninges, or calvarium. Electronically Signed   By: Staci Righter M.D.   On: 09/28/2015 14:54   Ct Chest W Contrast  09/28/2015  CLINICAL DATA:  Metastatic breast cancer status post bilateral mastectomy, chemotherapy ongoing. EXAM: CT CHEST, ABDOMEN, AND PELVIS WITH CONTRAST TECHNIQUE: Multidetector CT imaging of the chest, abdomen and pelvis was performed following the standard protocol during bolus administration of intravenous contrast. CONTRAST:  145mL OMNIPAQUE IOHEXOL 300 MG/ML  SOLN COMPARISON:  07/27/2015 FINDINGS: CT CHEST FINDINGS Mediastinum/Nodes: The heart is normal in size. No pericardial effusion. Aberrant right subclavian artery. Right chest port terminates in the upper right atrium. Improving thoracic nodal metastases, including: --1.4 cm short axis left supraclavicular node (series 5/image 7), previously 2.5 cm --1.2 cm short axis necrotic high right paratracheal node (series 5/image 14), previously 1.8 cm --1.5 cm short axis necrotic left prevascular node (series 5/image 17), previously 2.3 cm Additional small calcified mediastinal nodes, benign. Status post left axillary lymph node dissection. Visualized thyroid is unremarkable. Lungs/Pleura: 1.5 x 1.5 cm necrotic pleural-based nodule along the anterior right middle lobe (series 5/image 28), previously 1.8 x 2.3 cm. Associated small right pleural effusion along the major fissure and posterior lung base. 8.1 x 2.8 cm necrotic mass along the anterior left upper lobe/chest wall (series 5/ image 20), previously 7.8 x 4.0 cm. Associated small left pleural effusion, some of which is loculated anteriorly along the left chest wall mass. Again noted is interlobular septal thickening with scattered pulmonary nodularity, worrisome  for lymphangitic spread of tumor. 9 mm nodule in the anterior left upper lobe (series  7/ image 15, previously 6 mm. Otherwise, discrete nodularity is less conspicuous than on more remote prior studies. No pneumothorax. Musculoskeletal: Status post bilateral mastectomy with reconstruction. Mild degenerative changes of the thoracic spine. No focal osseous lesions. CT ABDOMEN PELVIS FINDINGS Hepatobiliary: Vague heterogeneously enhancing lesion in the anterior right hepatic dome is difficult to discretely measure but likely measures approximately 4.5 x 4.9 cm (series 5/image 41), previously 5.1 x 5.6 cm. Lesion along the anterior caudate measures approximately 1.6 x 1.3 cm, previously 2.5 x 1.8 cm. Additional areas of heterogeneous perfusion. Gallbladder is mildly thick-walled but underdistended. No intrahepatic or extrahepatic ductal dilatation. Pancreas: 2.7 x 1.8 cm hypoenhancing lesion in the pancreatic head (series 5/ image 57), previously 2.9 x 2.9 cm. Associated atrophy the pancreatic body/ tail. No pancreatic ductal dilatation. Spleen: Within normal limits. Adrenals/Urinary Tract: Adrenal glands are within normal limits. Kidneys are within normal limits.  No hydronephrosis. Bladder is mildly thick-walled although underdistended. Stomach/Bowel: Stomach is within normal limits. No evidence of bowel obstruction. Normal appendix (series 5/ image 88). Vascular/Lymphatic: No evidence of abdominal aortic aneurysm. Circumaortic left renal vein. Abnormal soft tissue adjacent to the caudate may reflect portacaval lymphadenopathy (series 5/ image 54) but is difficult to discretely measure. Otherwise, no suspicious abdominopelvic lymphadenopathy. Reproductive: Status post hysterectomy. No adnexal masses. Other: Small perisplenic and pelvic ascites, new. No gross peritoneal nodularity. Musculoskeletal: No focal osseous lesions. IMPRESSION: Status post bilateral mastectomy with left axillary lymph node dissection.  Pleural/parenchymal pulmonary metastases with suspected lymphangitic spread, as described above, improved. Thoracic lymphadenopathy, improved. Hepatic and pancreatic metastases, improved. Small volume abdominopelvic ascites, new. No gross peritoneal disease. Electronically Signed   By: Julian Hy M.D.   On: 09/28/2015 14:55   Ct Abdomen Pelvis W Contrast  09/28/2015  CLINICAL DATA:  Metastatic breast cancer status post bilateral mastectomy, chemotherapy ongoing. EXAM: CT CHEST, ABDOMEN, AND PELVIS WITH CONTRAST TECHNIQUE: Multidetector CT imaging of the chest, abdomen and pelvis was performed following the standard protocol during bolus administration of intravenous contrast. CONTRAST:  164m OMNIPAQUE IOHEXOL 300 MG/ML  SOLN COMPARISON:  07/27/2015 FINDINGS: CT CHEST FINDINGS Mediastinum/Nodes: The heart is normal in size. No pericardial effusion. Aberrant right subclavian artery. Right chest port terminates in the upper right atrium. Improving thoracic nodal metastases, including: --1.4 cm short axis left supraclavicular node (series 5/image 7), previously 2.5 cm --1.2 cm short axis necrotic high right paratracheal node (series 5/image 14), previously 1.8 cm --1.5 cm short axis necrotic left prevascular node (series 5/image 17), previously 2.3 cm Additional small calcified mediastinal nodes, benign. Status post left axillary lymph node dissection. Visualized thyroid is unremarkable. Lungs/Pleura: 1.5 x 1.5 cm necrotic pleural-based nodule along the anterior right middle lobe (series 5/image 28), previously 1.8 x 2.3 cm. Associated small right pleural effusion along the major fissure and posterior lung base. 8.1 x 2.8 cm necrotic mass along the anterior left upper lobe/chest wall (series 5/ image 20), previously 7.8 x 4.0 cm. Associated small left pleural effusion, some of which is loculated anteriorly along the left chest wall mass. Again noted is interlobular septal thickening with scattered pulmonary  nodularity, worrisome for lymphangitic spread of tumor. 9 mm nodule in the anterior left upper lobe (series 7/ image 15, previously 6 mm. Otherwise, discrete nodularity is less conspicuous than on more remote prior studies. No pneumothorax. Musculoskeletal: Status post bilateral mastectomy with reconstruction. Mild degenerative changes of the thoracic spine. No focal osseous lesions. CT ABDOMEN PELVIS FINDINGS Hepatobiliary: Vague heterogeneously enhancing lesion in the anterior  right hepatic dome is difficult to discretely measure but likely measures approximately 4.5 x 4.9 cm (series 5/image 41), previously 5.1 x 5.6 cm. Lesion along the anterior caudate measures approximately 1.6 x 1.3 cm, previously 2.5 x 1.8 cm. Additional areas of heterogeneous perfusion. Gallbladder is mildly thick-walled but underdistended. No intrahepatic or extrahepatic ductal dilatation. Pancreas: 2.7 x 1.8 cm hypoenhancing lesion in the pancreatic head (series 5/ image 57), previously 2.9 x 2.9 cm. Associated atrophy the pancreatic body/ tail. No pancreatic ductal dilatation. Spleen: Within normal limits. Adrenals/Urinary Tract: Adrenal glands are within normal limits. Kidneys are within normal limits.  No hydronephrosis. Bladder is mildly thick-walled although underdistended. Stomach/Bowel: Stomach is within normal limits. No evidence of bowel obstruction. Normal appendix (series 5/ image 88). Vascular/Lymphatic: No evidence of abdominal aortic aneurysm. Circumaortic left renal vein. Abnormal soft tissue adjacent to the caudate may reflect portacaval lymphadenopathy (series 5/ image 54) but is difficult to discretely measure. Otherwise, no suspicious abdominopelvic lymphadenopathy. Reproductive: Status post hysterectomy. No adnexal masses. Other: Small perisplenic and pelvic ascites, new. No gross peritoneal nodularity. Musculoskeletal: No focal osseous lesions. IMPRESSION: Status post bilateral mastectomy with left axillary lymph node  dissection. Pleural/parenchymal pulmonary metastases with suspected lymphangitic spread, as described above, improved. Thoracic lymphadenopathy, improved. Hepatic and pancreatic metastases, improved. Small volume abdominopelvic ascites, new. No gross peritoneal disease. Electronically Signed   By: Julian Hy M.D.   On: 09/28/2015 14:55    ASSESSMENT: 49 y.o. [place] woman  (1)  status post left lumpectomy under the care of Dr. 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 2 of 23 lymph nodes were involved.  Margins were clear.  (2) status post adjuvant chemotherapy consisting of 6 q. three-week doses of docetaxel/ doxorubicin/ cyclophosphamide given between January 2007 and may of 2007, with last dose on 04/20/2006.  (3) status post radiation therapy to the left breast, completed 07/11/2006  (4) began on tamoxifen in early August 2007 and continued until October 2009. She status post hysterectomy with bilateral salpingo-oophorectomy on 07/15/2008, and was started on letrozole in October of 2009.  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 PET and CT of the chest showed no evidence of metastasis in January 2011.  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 Margins were clear. Patient underwent concurrent left latissimus flap reconstruction and right implant reconstruction.  (7)  status post additional chemotherapy with one cycle of carboplatin/gemcitabine given on 03/11/2010. She then received 5 q. three-week cycles of IV CMF between 03/25/2010 and 06/17/2010.  (8)  started on exemestane in January 2012 and continued until late November 2013 when she was hospitalized for apparent TIA at which time her exemestane was stopped and not resumed  (9) METASTATIC DISEASE: evidence of disease recurence noted on chest CT, liver MRI and PET scan in December 2014, with suspicious lung nodules, lymphadenopathy, and 3 lesions in the right lobe of the liver, but no evidence of bony disease and no evidence of brain involvement by brain MRI September 2014. Biopsy of a left supraclavicular lymph node on 12/11/2013 confirmed metastatic carcinoma, estrogen receptor 45% positive, progesterone receptor 17% positive, with no HER-2  amplification.  (10) fulvestrant started 12/03/2013, discontinued 03/25/2014 with progression  (11) exemestane/ everolimus started 04/04/2014; everolimus held 04/14/2014, with skin rash and mouth soreness; resumed at 5 mg a day as of 04/29/2014, discontinued 05/09/2014 with similar symptoms  (12) continueing exemestane, adding palbociclib 05/26/2014;   (a) palbociclib dose dropped to 75 mg every other day for 21 days beginning with cycle 4  (b) exemestane and Palbociclib discontinued December 2015 with evidence of progression  (13) [facility] referral placed 04/28/2049 -- re-sent 05/23/2014-- shows Pi3KCA and TP53 mutations  (a) [facility] one test requested 12/10/2014-- confirms [employer] results  (b) did not qualify for capecitabine/ BYL 719 trial because of elevated lipase  (14) started eribulin 12/26/2014, 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   (a) dpse dropped from 2g BID to 1.5g BID starting on 04/26/15  (b) stopped 05/17/2015 (after 4 cycles) with progression  (16) started carboplatin/ gemcitabine 05/26/2015, to be given day one and day 8 of each 21 day cycle  (a) given her  history of severe neutropenia with this chemotherapy we'll do Neupogen days 2, 3 and 4 of each cycle and onpro day 9  (b) carbo dose dropped by 25% w cycle 2 due to very low platelet nadir  (c) CT scans after 3 cycles (07/27/2015) show measurable response  (d) CT scans after 6 cycles (09/28/2015)show continuing response  (17) Left pleural effusion\   (a) s/p L thoracentesis 05/22/2015, 05/29/2015 and 06/05/2015  (b) attempt at pleurx 06/12/2015 cancelled as effusion loculated and scant  (18) symptomatic anemia  (a) Aranesp started 07/28/2015, retic <1% on 10/06/2015  (b) Feraheme given 08/04/2015; ferritin >2K on 10/06/2015  PLAN:  Alexiah likely has significant involvement of her bone marrow by her cancer and that is the reason we are having such trouble getting her counts up. I am further increasing the Aranesp to 500 every 2 weeks instead of every 3 weeks. I have asked her to stop her iron since her ferritin is already quite high. If we don't get a bump in her reticulocyte count after another 6-8 weeks we will stop the Aranesp and simply transfuse as needed.  I think her port was traumatized slightly when accessed today but it is usable and saliva 3 drawback easily other should be no issue with extravasation  Clearly we need to change her chemotherapy to every 2 weeks. This has the advantage that it does facilitate her schedule greatly. She will not have to come here 3 additional days just to get Neupogen. Nevertheless we are going to continue to see her on a weekly basis because she needs transfusions frequently.  She is already scheduled for restaging studies mid December. I am hopeful we can continue on this schedule a few more months before we have documented progression, at which time we can consider CMF versus Doxil  Jerome has a good understanding of this plan. She will call with any problems that may develop before her next visit here.  Chauncey Cruel, MD   10/13/2015 10:21  AM

## 2015-10-13 NOTE — Patient Instructions (Signed)
[facility] Discharge Instructions for Patients Receiving Chemotherapy  Today you received the following chemotherapy agents: Gemzar and carboplatin.  To help prevent nausea and vomiting after your treatment, we encourage you to take your nausea medicationCompazine 10 mg every 6 hours as needed and Zofran 8 mg as directed.   If you develop nausea and vomiting that is not controlled by your nausea medication, call the clinic.   BELOW ARE SYMPTOMS THAT SHOULD BE REPORTED IMMEDIATELY:  *FEVER GREATER THAN 100.5 F  *CHILLS WITH OR WITHOUT FEVER  NAUSEA AND VOMITING THAT IS NOT CONTROLLED WITH YOUR NAUSEA MEDICATION  *UNUSUAL SHORTNESS OF BREATH  *UNUSUAL BRUISING OR BLEEDING  TENDERNESS IN MOUTH AND THROAT WITH OR WITHOUT PRESENCE OF ULCERS  *URINARY PROBLEMS  *BOWEL PROBLEMS  UNUSUAL RASH Items with * indicate a potential emergency and should be followed up as soon as possible.  Feel free to call the clinic you have any questions or concerns. The clinic phone number is [phone].  Please show the [place] at check-in to the Emergency Department and triage nurse.

## 2015-10-13 NOTE — Progress Notes (Signed)
Reviewed labs. OK to treat per [name].

## 2015-10-15 ENCOUNTER — Other Ambulatory Visit: Payer: Self-pay | Admitting: Oncology

## 2015-10-15 NOTE — Telephone Encounter (Signed)
Chart reviewed.

## 2015-10-16 ENCOUNTER — Other Ambulatory Visit: Payer: Self-pay

## 2015-10-20 ENCOUNTER — Ambulatory Visit (HOSPITAL_BASED_OUTPATIENT_CLINIC_OR_DEPARTMENT_OTHER): Payer: 59 | Admitting: Physician Assistant

## 2015-10-20 ENCOUNTER — Ambulatory Visit (HOSPITAL_COMMUNITY)
Admission: RE | Admit: 2015-10-20 | Discharge: 2015-10-20 | Disposition: A | Discharge status: Home / Self Care | Payer: 59 | Source: Ambulatory Visit | Attending: Oncology | Admitting: Oncology

## 2015-10-20 ENCOUNTER — Ambulatory Visit: Payer: 59

## 2015-10-20 ENCOUNTER — Telehealth: Payer: Self-pay

## 2015-10-20 ENCOUNTER — Ambulatory Visit (HOSPITAL_BASED_OUTPATIENT_CLINIC_OR_DEPARTMENT_OTHER): Payer: 59

## 2015-10-20 ENCOUNTER — Encounter: Payer: Self-pay | Admitting: Physician Assistant

## 2015-10-20 ENCOUNTER — Other Ambulatory Visit (HOSPITAL_BASED_OUTPATIENT_CLINIC_OR_DEPARTMENT_OTHER): Payer: 59

## 2015-10-20 VITALS — BP 120/69 | HR 91 | Temp 98.9°F | Resp 19

## 2015-10-20 VITALS — BP 119/72 | HR 99 | Temp 98.7°F | Resp 20 | Ht 63.0 in | Wt 181.9 lb

## 2015-10-20 DIAGNOSIS — C787 Secondary malignant neoplasm of liver and intrahepatic bile duct: Secondary | ICD-10-CM | POA: Diagnosis not present

## 2015-10-20 DIAGNOSIS — C50919 Malignant neoplasm of unspecified site of unspecified female breast: Secondary | ICD-10-CM

## 2015-10-20 DIAGNOSIS — C50312 Malignant neoplasm of lower-inner quadrant of left female breast: Secondary | ICD-10-CM

## 2015-10-20 DIAGNOSIS — D649 Anemia, unspecified: Secondary | ICD-10-CM | POA: Diagnosis present

## 2015-10-20 DIAGNOSIS — D630 Anemia in neoplastic disease: Secondary | ICD-10-CM | POA: Diagnosis not present

## 2015-10-20 DIAGNOSIS — Z95828 Presence of other vascular implants and grafts: Secondary | ICD-10-CM

## 2015-10-20 LAB — COMPREHENSIVE METABOLIC PANEL (CC13)
ALK PHOS: 169 U/L — AB (ref 40–150)
ALT: 32 U/L (ref 0–55)
AST: 37 U/L — ABNORMAL HIGH (ref 5–34)
Albumin: 2.4 g/dL — ABNORMAL LOW (ref 3.5–5.0)
Anion Gap: 5 mEq/L (ref 3–11)
BILIRUBIN TOTAL: 0.94 mg/dL (ref 0.20–1.20)
BUN: 12.6 mg/dL (ref 7.0–26.0)
CO2: 29 mEq/L (ref 22–29)
Calcium: 8.3 mg/dL — ABNORMAL LOW (ref 8.4–10.4)
Chloride: 108 mEq/L (ref 98–109)
Creatinine: 0.6 mg/dL (ref 0.6–1.1)
GLUCOSE: 107 mg/dL (ref 70–140)
Potassium: 3.9 mEq/L (ref 3.5–5.1)
Sodium: 142 mEq/L (ref 136–145)
TOTAL PROTEIN: 4.4 g/dL — AB (ref 6.4–8.3)

## 2015-10-20 LAB — PREPARE RBC (CROSSMATCH)

## 2015-10-20 LAB — CBC WITH DIFFERENTIAL/PLATELET
BASO%: 0.3 % (ref 0.0–2.0)
BASOS ABS: 0 10*3/uL (ref 0.0–0.1)
EOS ABS: 0 10*3/uL (ref 0.0–0.5)
EOS%: 0.1 % (ref 0.0–7.0)
HEMATOCRIT: 25.2 % — AB (ref 34.8–46.6)
HEMOGLOBIN: 8 g/dL — AB (ref 11.6–15.9)
LYMPH#: 0.7 10*3/uL — AB (ref 0.9–3.3)
LYMPH%: 9.8 % — ABNORMAL LOW (ref 14.0–49.7)
MCH: 32.7 pg (ref 25.1–34.0)
MCHC: 31.7 g/dL (ref 31.5–36.0)
MCV: 102.9 fL — AB (ref 79.5–101.0)
MONO#: 0.5 10*3/uL (ref 0.1–0.9)
MONO%: 6.4 % (ref 0.0–14.0)
NEUT#: 5.9 10*3/uL (ref 1.5–6.5)
NEUT%: 83.4 % — AB (ref 38.4–76.8)
PLATELETS: 9 10*3/uL — AB (ref 145–400)
RBC: 2.45 10*6/uL — ABNORMAL LOW (ref 3.70–5.45)
RDW: 28.3 % — ABNORMAL HIGH (ref 11.2–14.5)
WBC: 7.1 10*3/uL (ref 3.9–10.3)
nRBC: 0 % (ref 0–0)

## 2015-10-20 LAB — HOLD TUBE, BLOOD BANK

## 2015-10-20 MED ORDER — SODIUM CHLORIDE 0.9 % IV SOLN
250.0000 mL | Freq: Once | INTRAVENOUS | Status: AC
  Administered 2015-10-20: 250 mL via INTRAVENOUS

## 2015-10-20 MED ORDER — ACETAMINOPHEN 325 MG PO TABS
650.0000 mg | ORAL_TABLET | Freq: Once | ORAL | Status: AC
  Administered 2015-10-20: 650 mg via ORAL

## 2015-10-20 MED ORDER — DIPHENHYDRAMINE HCL 25 MG PO CAPS
25.0000 mg | ORAL_CAPSULE | Freq: Once | ORAL | Status: AC
  Administered 2015-10-20: 25 mg via ORAL

## 2015-10-20 MED ORDER — DIPHENHYDRAMINE HCL 25 MG PO CAPS
ORAL_CAPSULE | ORAL | Status: AC
  Filled 2015-10-20: qty 1

## 2015-10-20 MED ORDER — SODIUM CHLORIDE 0.9 % IJ SOLN
10.0000 mL | INTRAMUSCULAR | Status: AC | PRN
  Administered 2015-10-20: 10 mL
  Filled 2015-10-20: qty 10

## 2015-10-20 MED ORDER — ACETAMINOPHEN 325 MG PO TABS
ORAL_TABLET | ORAL | Status: AC
  Filled 2015-10-20: qty 2

## 2015-10-20 MED ORDER — SODIUM CHLORIDE 0.9 % IJ SOLN
10.0000 mL | INTRAMUSCULAR | Status: DC | PRN
  Administered 2015-10-20: 10 mL via INTRAVENOUS
  Filled 2015-10-20: qty 10

## 2015-10-20 MED ORDER — HEPARIN SOD (PORK) LOCK FLUSH 100 UNIT/ML IV SOLN
500.0000 [IU] | Freq: Every day | INTRAVENOUS | Status: AC | PRN
  Administered 2015-10-20: 500 [IU]
  Filled 2015-10-20: qty 5

## 2015-10-20 NOTE — Patient Instructions (Signed)

## 2015-10-20 NOTE — Patient Instructions (Signed)
Blood Transfusion  A blood transfusion is a procedure in which you receive donated blood through an IV tube. You may need a blood transfusion because of illness, surgery, or injury. The blood may come from a donor, or it may be your own blood that you donated previously. The blood given in a transfusion is made up of different types of cells. You may receive:  Red blood cells. These carry oxygen and replace lost blood.  Platelets. These control bleeding.  Plasma. Thishelps blood to clot. If you have hemophilia or another clotting disorder, you may also receive other types of blood products. LET [facility] CARE PROVIDER KNOW ABOUT:  Any allergies you have.  All medicines you are taking, including vitamins, herbs, eye drops, creams, and over-the-counter medicines.  Previous problems you or members of your family have had with the use of anesthetics.  Any blood disorders you have.  Previous surgeries you have had.  Any medical conditions you may have.  Any previous reactions you have had during a blood transfusion.  RISKS AND COMPLICATIONS Generally, this is a safe procedure. However, problems may occur, including:  Having an allergic reaction to something in the donated blood.  Fever. This may be a reaction to the white blood cells in the transfused blood.  Iron overload. This can happen from having many transfusions.  Transfusion-related acute lung injury (TRALI). This is a rare reaction that causes lung damage. The cause is not known.TRALI can occur within hours of a transfusion or several days later.  Sudden (acute) or delayed hemolytic reactions. This happens if your blood does not match the cells in your transfusion. Your body's defense system (immune system) may try to attack the new cells. This complication is rare.  Infection. This is rare. BEFORE THE PROCEDURE  You may have a blood test to determine your blood type. This is necessary to know what kind of blood your  body will accept.  If you are going to have a planned surgery, you may donate your own blood. This may be done in case you need to have a transfusion.  If you have had an allergic reaction to a transfusion in the past, you may be given medicine to help prevent a reaction. Take this medicine only as directed by your health care provider.  You will have your temperature, blood pressure, and pulse monitored before the transfusion. PROCEDURE   An IV will be started in your hand or arm.  The bag of donated blood will be attached to your IV tube and given into your vein.  Your temperature, blood pressure, and pulse will be monitored regularly during the transfusion. This monitoring is done to detect early signs of a transfusion reaction.  If you have any signs or symptoms of a reaction, your transfusion will be stopped and you may be given medicine.  When the transfusion is over, your IV will be removed.  Pressure may be applied to the IV site for a few minutes.  A bandage (dressing) will be applied. The procedure may vary among health care providers and hospitals. AFTER THE PROCEDURE  Your blood pressure, temperature, and pulse will be monitored regularly.   This information is not intended to replace advice given to you by your health care provider. Make sure you discuss any questions you have with your health care provider.   Document Released: 11/25/2000 Document Revised: 12/19/2014 Document Reviewed: 10/08/2014 Elsevier Interactive Patient Education 2016 [employer]  Platelet Transfusion  A platelet transfusion is  a procedure in which you receive donated platelets through an IV tube. Platelets are tiny pieces of blood cells. When a blood vessel is damaged, platelets collect in the damaged area to help form a blood clot. This begins the healing process. If your platelet count gets too low, your blood may have trouble clotting.  You may need a platelet transfusion if you have a  condition that causes a low number of platelets (thrombocytopenia). A platelet transfusion may be used to stop or prevent bleeding.  LET [facility] CARE PROVIDER KNOW ABOUT:   Any allergies you have.   All medicines you are taking, including vitamins, herbs, eye drops, creams, and over-the-counter medicines.   Previous problems you or members of your family have had with the use of anesthetics.   Any blood disorders you have.   Previous surgeries you have had.   Any medical conditions you may have.   Any reactions you have had during a previous transfusion. RISKS AND COMPLICATIONS Generally, this is a safe procedure. However, problems may occur, including:   Fever with or without chills. The fever usually occurs within the first 4 hours of the transfusion and returns to normal within 48 hours.  Allergic reaction. The reaction is most commonly caused by antibodies your body creates against substances in the transfusion. Signs of an allergic reaction may include itching, hives, difficulty breathing, shock, or low blood pressure.  Sudden (acute) or delayed hemolytic reaction. This rare reaction can occur during the transfusion and up to 28 days after the transfusion. The reaction usually occurs when your body's defense system (immune system) attacks the new platelets. Signs of a hemolytic reaction may include fever, headache, difficulty breathing, low blood pressure, a rapid heartbeat, or pain in your back, abdomen, chest, or IV site.  Transfusion-related acute lung injury (TRALI). TRALI can occur within hours of a transfusion, or several days later. This is a rare reaction that causes lung damage. The cause is not known.  Infection. Signs of this rare complication may include fever, chills, vomiting, a rapid heartbeat, or low blood pressure. BEFORE THE PROCEDURE   You may have a blood test to determine your blood type. This is necessary to find out what kind ofplatelets best  matches your platelets.  If you have had an allergic reaction to a transfusion in the past, you may be given medicine to help prevent a reaction. Take this medicine only as directed by your health care provider.  Your temperature, blood pressure, and pulse will be monitored before the transfusion. PROCEDURE  An IV will be started in your hand or arm.  The transfusion will be attached to your IV tubing. The bag of donated platelets will be attached to your IV tube andgiven into your vein.  Your temperature, blood pressure, and pulse will be monitored regularly during the transfusion. This monitoring is done to help detect early signs of a transfusion reaction.  If you have any signs or symptoms of a reaction, your transfusion will be stopped and you may be given medicine.  When your transfusion is complete, your IV will be removed.  Pressure may be applied to the IV site for a few minutes.  A bandage (dressing) will be applied. The procedure may vary among health care providers and hospitals. AFTER THE PROCEDURE  Your blood pressure, temperature, and pulse will be monitored regularly.   This information is not intended to replace advice given to you by your health care provider. Make sure you discuss  any questions you have with your health care provider.   Document Released: 09/25/2007 Document Revised: 12/19/2014 Document Reviewed: 10/08/2014 Elsevier Interactive Patient Education [employer].

## 2015-10-20 NOTE — Progress Notes (Signed)
ID: Kristin Hoffman OB: 1966-09-09  MR#: [id number]  WER#:[id number]  PCP: Kristin Leach, MD GYN:  Kristin Hoffman SU: Kristin Hoffman OTHER MD: Kristin Hoffman, Kristin Hoffman  CHIEF COMPLAINT:  Stage IV breast cancer  CURRENT TREATMENT: carboplatin/ gemcitabine/ aranesp  BREAST CANCER HISTORY: This patient was previously followed by Dr. Eston Hoffman, and was transferred to Dr. Virgie Hoffman service as of 08/20/2013.  At the age of 4, the patient had a screening mammogram as a baseline. She had recently given birth and was on birth control pills at that time. The mammogram in November 2006 showed an area of architectural distortion in the left lower inner quadrant. Subsequently an ultrasound was obtained of the left breast showing a 2.0 x 1.5 x 1.4 cm mass, at the 8:00 position, 4 cm from the nipple. A core biopsy performed on 10/21/2005 showed an invasive mammary carcinoma, ER +70%, PR +41%, HER-2/neu negative, with proliferation marker of 38%. [phone])  Breast MRI on 11/08/2005 confirmed a 2.5 cm spiculated mass in the left lower inner quadrant with 3 small satellite nodules adjacent to the primary mass: 3.5 cm posterior medial to the primary mass, measuring 9 mm; 1.5 cm anterior medial to the primary mass measuring 5 mm; and 2 cm medial to the primary mass measuring 5 mm. No axillary adenopathy was noted. No suspicious masses or enhancement are noted in the right breast.  Kristin Hoffman underwentt left lumpectomy under the care of Kristin Hoffman on 11/18/2005 for a 2.5 cm grade 3 invasive ductal carcinoma, ER +70%, PR +41%, HER-2/neu negative, with proliferation marker of 38%. 2 of 23 lymph nodes were involved.  Margins were clear.  She received adjuvant chemotherapy consisting of 6 q. three-week doses of docetaxel/doxorubicin/cyclophosphamide given between January 2007 and may of 2007, with last dose on 04/20/2006. She underwent radiation therapy completed 07/11/2006, after which she began on tamoxifen in  early August 2007.  She underwent hysterectomy with bilateral salpingo-oophorectomy on 07/15/2008, and was started on letrozole in October of 2009.  A mammogram 11/10/2009 showed microcalcifications in the left breast, and a subsequent biopsy on 11/18/2009 confirmed invasive mammary carcinoma in the left breast. PET and CT of the chest showed no evidence of metastasis in January 2011.  She underwent bilateral mastectomies 01/25/2010, with the right breast showing no evidence of malignancy; in the left breast showing a 1.0 cm grade 2 invasive ductal carcinoma with high-grade DCIS. Tumor was ER +22%, PR negative, HER-2/neu negative, with proliferation marker of 13%. Margins were clear. Patient underwent concurrent left latissimus flap reconstruction and right implant reconstruction.  She received additional chemotherapy with one cycle of carboplatin/gemcitabine given on 03/11/2010. She then received 5 q. three-week cycles of IV CMF between 03/25/2010 and 06/17/2010.  She was started on exemestane in January 2012 and continued until late November 2013 when she was hospitalized for apparent TIA.   Her subsequent history is as detailed below  INTERVAL HISTORY: Kristin Hoffman returns today for follow up of her metastatic breast cancer. She received D1 C7 of carboplatin and gemcitabine given originally on days 1 and 8 of each cycle. On 10/13/15,  the dose was changed to every 2 weeks because her counts simply do not recover fast enough despite growth factors. She will receive onpro with each treatment, but she will not receive Neupogen anymore since the treatments have been moved to every 2 weeks.   REVIEW OF SYSTEMS: She is feeling overall well without major complaints. She has occasional nose bleeds, otherwise, she denies any other  bleeding sources such as gum bleed, hematochezia, hematuria, hemoptysis, or hematemesis. She continues to sleep poorly, she has pain under the left breast in the rib cage whenever she turns  to wipe out a bowel movement and she also has left scapular pain, neither which is more frequent or intense than before.She denies any shortness of breath, or cardiac chest pain. She denies any nausea or vomiting. She denies any diarrhea or constipation. She denies any dysuria. She bruises easily. She feels very forgetful. She takes naps most days. There has been no intercurrent fever however. There have been no unusual headaches. She denies any new rashes or mucositis. A detailed review of systems today was otherwise stable  PAST MEDICaL HISTORY: Past Medical History  Diagnosis Date  . Depression   . Reflux   . Hx-TIA (transient ischemic attack) 11/22/2013  . Chronic headaches 11/22/2013  . Anxiety 11/22/2013  . Breast cancer (HCC)   . Cancer (HCC)   . Breast cancer metastasized to multiple sites (HCC) 12/24/2013    PAST SURGICAL HISTORY: Past Surgical History  Procedure Laterality Date  . Mastectomy w/ nodes partial    . Mastectomy    . Breast reconstruction    . Portacath placement      x 2  . Portacath removal      x 2  . Abdominal hysterectomy    . Tee without cardioversion  11/12/2012    Procedure: TRANSESOPHAGEAL ECHOCARDIOGRAM (TEE);  Surgeon: Kristin Skains, MD;  Location: [facility];  Service: Cardiovascular;  Laterality: N/A;  This TEE may be Dr. Skains or a LHC Dr    FAMILY HISTORY Both parents are alive and well. Patient has one sister who is 10 years younger and is in good health. No other history of breast or ovarian cancer in the family. Family History  Problem Relation Age of Onset  . Diabetes Mellitus II Neg Hx   . Hypertension Neg Hx     GYNECOLOGIC HISTORY:   (Updated January 2015) G2P2, menarche at age 11 with irregular menses. On birth control pills in the past. On Clomid to induce ovulation with first pregnancy. Also had preeclampsia with first pregnancy. Status post hysterectomy and bilateral salpingo-oophorectomy in August 2009.  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 She is originally from [place]. She's been married to Greg, for 13 years. He works as an electronics technician at [employer].   ADVANCED DIRECTIVES:  Not in place. At the clinic visit 10/20/2015 the patient was given the appropriate forms to complete and notarize at her discretion.    HEALTH MAINTENANCE:  (Updated 11/22/2013) Social History  Substance Use Topics  . Smoking status: Never Smoker   . Smokeless tobacco: Never Used  . Alcohol Use: Yes     Comment: rarely     Colonoscopy:  Never  PAP: S/P LAVH/BSO in August 2009  Bone density: Never  Lipid panel: Not on file   Allergies  Allergen Reactions  . Afinitor [Everolimus] Hives and Itching    Current Outpatient Prescriptions  Medication Sig Dispense Refill  . acidophilus (RISAQUAD) CAPS capsule Take 1 capsule by mouth every morning.     . Biotin 5000 MCG CAPS Take 5,000 mcg by mouth every morning.  30 capsule   . cetirizine (ZYRTEC) 10 MG tablet Take 10 mg by mouth at bedtime.     . Cholecalciferol 2000 UNITS CHEW Chew 1   tablet by mouth daily with breakfast.     . diphenoxylate-atropine (LOMOTIL) 2.5-0.025 MG tablet TAKE 2 TABLETS BY MOUTH 4 TIMES DAILY AS NEEDED FOR DIARRHEA 30 tablet 0  . escitalopram (LEXAPRO) 20 MG tablet Take 1 tablet (20 mg total) by mouth daily. 30 tablet 12  . Ibuprofen (ADVIL) 200 MG CAPS Take 2 capsules (400 mg total) by mouth 3 (three) times daily with meals as needed. 120 each 0  . LORazepam (ATIVAN) 0.5 MG tablet Take 1 tablet (0.5 mg total) by mouth every 6 (six) hours as needed for anxiety. 120 tablet 1  . Melatonin 10 MG TABS Take 1 tablet by mouth at bedtime.    . montelukast (SINGULAIR) 10 MG tablet Take 10 mg by mouth daily with breakfast.     . Multiple Vitamin (MULTIVITAMIN) tablet Take 1 tablet by mouth every morning.     . nystatin (MYCOSTATIN) 100000 UNIT/ML suspension  Take 5 mLs (500,000 Units total) by mouth 4 (four) times daily. 240 mL 1  . omeprazole (PRILOSEC) 40 MG capsule Take 40 mg by mouth every morning.     . ondansetron (ZOFRAN) 8 MG tablet TAKE 1 TABLET BY MOUTH 2 TIMES DAILY. START DAY AFTER CHEMO FOR 2 DAYS. THEN TAKE AS NEEDED NAUSEA 30 tablet 1  . prochlorperazine (COMPAZINE) 10 MG tablet Take 10 mg by mouth every 6 (six) hours as needed for nausea or vomiting.     . traMADol (ULTRAM) 50 MG tablet Take 1 tablet (50 mg total) by mouth every 6 (six) hours as needed. 120 tablet 1   No current facility-administered medications for this visit.   Facility-Administered Medications Ordered in Other Visits  Medication Dose Route Frequency Provider Last Rate Last Dose  . heparin lock flush 100 unit/mL  500 Units Intracatheter Daily PRN Sara E Wertman, PA-C      . sodium chloride 0.9 % injection 10 mL  10 mL Intracatheter PRN Gustav C Magrinat, MD   10 mL at 09/15/15 1524  . sodium chloride 0.9 % injection 10 mL  10 mL Intracatheter PRN Sara E Wertman, PA-C        OBJECTIVE: Young white woman in no acute distress Filed Vitals:   10/20/15 0912  BP: 119/72  Pulse: 99  Temp: 98.7 F (37.1 C)  Resp: 20  Body mass index is 32.23 kg/(m^2).  ECOG: 2 Filed Weights   10/20/15 0912  Weight: 181 lb 14.4 oz (82.509 kg)   Sclerae unicteric, pupils round and equal Oropharynx showsno thrush No cervical or supraclavicular adenopathy Lungs no rales or rhonchi Heart regular rate and rhythm Abd soft, nontender, positive bowel sounds MSK no focal spinal tenderness, minimal bilateral ankle edema Neuro: nonfocal, well oriented, appropriate affect Breasts: Deferred Skin: The port is Normal There is no tenderness to palpation and no swelling. Minimal petechia noted on the dorsal aspect of her hands.   LAB RESULTS:  CBC Latest Ref Rng 10/20/2015 10/13/2015 10/06/2015  WBC 3.9 - 10.3 10e3/uL 7.1 1.9(L) 2.4(L)  Hemoglobin 11.6 - 15.9 g/dL 8.0(L) 9.1(L) 7.7(L)   Hematocrit 34.8 - 46.6 % 25.2(L) 28.0(L) 24.7(L)  Platelets 145 - 400 10e3/uL 9(LL) 58(L) 26(L)   CMP Latest Ref Rng 10/20/2015 10/13/2015 10/06/2015  Glucose 70 - 140 mg/dl 107 86 104  BUN 7.0 - 26.0 mg/dL 12.6 11.6 14.9  Creatinine 0.6 - 1.1 mg/dL 0.6 0.6 0.6  Sodium 136 - 145 mEq/L 142 141 141  Potassium 3.5 - 5.1 mEq/L 3.9 3.7 3.9  Chloride 101 -   111 mmol/L - - -  CO2 22 - 29 mEq/L 29 28 27  Calcium 8.4 - 10.4 mg/dL 8.3(L) 8.5 8.5  Total Protein 6.4 - 8.3 g/dL 4.4(L) 4.8(L) 4.3(L)  Total Bilirubin 0.20 - 1.20 mg/dL 0.94 1.02 0.85  Alkaline Phos 40 - 150 U/L 169(H) 136 138  AST 5 - 34 U/L 37(H) 48(H) 54(H)  ALT 0 - 55 U/L 32 34 44    STUDIES: Ct Head W Wo Contrast  09/28/2015  CLINICAL DATA:  Restaging breast cancer. Widespread metastatic disease. EXAM: CT HEAD WITHOUT AND WITH CONTRAST TECHNIQUE: Contiguous axial images were obtained from the base of the skull through the vertex without and with intravenous contrast CONTRAST:  100mL OMNIPAQUE IOHEXOL 300 MG/ML  SOLN COMPARISON:  MRI brain 08/20/2013.  Body CT reported separately. FINDINGS: No evidence for acute infarction, hemorrhage, mass lesion, hydrocephalus, or extra-axial fluid. Slight premature for age cerebral and cerebellar atrophy. Slight hypoattenuation of white matter, likely small vessel disease or post treatment effect. Stable chronic cortical and subcortical infarcts. Post infusion, no abnormal enhancement of the brain or visible meninges. No destructive calvarial lesions. Incidental Pacchionian granulations around the torcula are stable from prior MR. IMPRESSION: Premature for age atrophy. No acute intracranial findings. No visible metastatic disease to the brain, meninges, or calvarium. Electronically Signed   By: John T Curnes M.D.   On: 09/28/2015 14:54   Ct Chest W Contrast  09/28/2015  CLINICAL DATA:  Metastatic breast cancer status post bilateral mastectomy, chemotherapy ongoing. EXAM: CT CHEST, ABDOMEN, AND PELVIS  WITH CONTRAST TECHNIQUE: Multidetector CT imaging of the chest, abdomen and pelvis was performed following the standard protocol during bolus administration of intravenous contrast. CONTRAST:  100mL OMNIPAQUE IOHEXOL 300 MG/ML  SOLN COMPARISON:  07/27/2015 FINDINGS: CT CHEST FINDINGS Mediastinum/Nodes: The heart is normal in size. No pericardial effusion. Aberrant right subclavian artery. Right chest port terminates in the upper right atrium. Improving thoracic nodal metastases, including: --1.4 cm short axis left supraclavicular node (series 5/image 7), previously 2.5 cm --1.2 cm short axis necrotic high right paratracheal node (series 5/image 14), previously 1.8 cm --1.5 cm short axis necrotic left prevascular node (series 5/image 17), previously 2.3 cm Additional small calcified mediastinal nodes, benign. Status post left axillary lymph node dissection. Visualized thyroid is unremarkable. Lungs/Pleura: 1.5 x 1.5 cm necrotic pleural-based nodule along the anterior right middle lobe (series 5/image 28), previously 1.8 x 2.3 cm. Associated small right pleural effusion along the major fissure and posterior lung base. 8.1 x 2.8 cm necrotic mass along the anterior left upper lobe/chest wall (series 5/ image 20), previously 7.8 x 4.0 cm. Associated small left pleural effusion, some of which is loculated anteriorly along the left chest wall mass. Again noted is interlobular septal thickening with scattered pulmonary nodularity, worrisome for lymphangitic spread of tumor. 9 mm nodule in the anterior left upper lobe (series 7/ image 15, previously 6 mm. Otherwise, discrete nodularity is less conspicuous than on more remote prior studies. No pneumothorax. Musculoskeletal: Status post bilateral mastectomy with reconstruction. Mild degenerative changes of the thoracic spine. No focal osseous lesions. CT ABDOMEN PELVIS FINDINGS Hepatobiliary: Vague heterogeneously enhancing lesion in the anterior right hepatic dome is  difficult to discretely measure but likely measures approximately 4.5 x 4.9 cm (series 5/image 41), previously 5.1 x 5.6 cm. Lesion along the anterior caudate measures approximately 1.6 x 1.3 cm, previously 2.5 x 1.8 cm. Additional areas of heterogeneous perfusion. Gallbladder is mildly thick-walled but underdistended. No intrahepatic or extrahepatic ductal dilatation.   Pancreas: 2.7 x 1.8 cm hypoenhancing lesion in the pancreatic head (series 5/ image 57), previously 2.9 x 2.9 cm. Associated atrophy the pancreatic body/ tail. No pancreatic ductal dilatation. Spleen: Within normal limits. Adrenals/Urinary Tract: Adrenal glands are within normal limits. Kidneys are within normal limits.  No hydronephrosis. Bladder is mildly thick-walled although underdistended. Stomach/Bowel: Stomach is within normal limits. No evidence of bowel obstruction. Normal appendix (series 5/ image 88). Vascular/Lymphatic: No evidence of abdominal aortic aneurysm. Circumaortic left renal vein. Abnormal soft tissue adjacent to the caudate may reflect portacaval lymphadenopathy (series 5/ image 54) but is difficult to discretely measure. Otherwise, no suspicious abdominopelvic lymphadenopathy. Reproductive: Status post hysterectomy. No adnexal masses. Other: Small perisplenic and pelvic ascites, new. No gross peritoneal nodularity. Musculoskeletal: No focal osseous lesions. IMPRESSION: Status post bilateral mastectomy with left axillary lymph node dissection. Pleural/parenchymal pulmonary metastases with suspected lymphangitic spread, as described above, improved. Thoracic lymphadenopathy, improved. Hepatic and pancreatic metastases, improved. Small volume abdominopelvic ascites, new. No gross peritoneal disease. Electronically Signed   By: Julian Hy M.D.   On: 09/28/2015 14:55   Ct Abdomen Pelvis W Contrast  09/28/2015  CLINICAL DATA:  Metastatic breast cancer status post bilateral mastectomy, chemotherapy ongoing. EXAM: CT CHEST,  ABDOMEN, AND PELVIS WITH CONTRAST TECHNIQUE: Multidetector CT imaging of the chest, abdomen and pelvis was performed following the standard protocol during bolus administration of intravenous contrast. CONTRAST:  182m OMNIPAQUE IOHEXOL 300 MG/ML  SOLN COMPARISON:  07/27/2015 FINDINGS: CT CHEST FINDINGS Mediastinum/Nodes: The heart is normal in size. No pericardial effusion. Aberrant right subclavian artery. Right chest port terminates in the upper right atrium. Improving thoracic nodal metastases, including: --1.4 cm short axis left supraclavicular node (series 5/image 7), previously 2.5 cm --1.2 cm short axis necrotic high right paratracheal node (series 5/image 14), previously 1.8 cm --1.5 cm short axis necrotic left prevascular node (series 5/image 17), previously 2.3 cm Additional small calcified mediastinal nodes, benign. Status post left axillary lymph node dissection. Visualized thyroid is unremarkable. Lungs/Pleura: 1.5 x 1.5 cm necrotic pleural-based nodule along the anterior right middle lobe (series 5/image 28), previously 1.8 x 2.3 cm. Associated small right pleural effusion along the major fissure and posterior lung base. 8.1 x 2.8 cm necrotic mass along the anterior left upper lobe/chest wall (series 5/ image 20), previously 7.8 x 4.0 cm. Associated small left pleural effusion, some of which is loculated anteriorly along the left chest wall mass. Again noted is interlobular septal thickening with scattered pulmonary nodularity, worrisome for lymphangitic spread of tumor. 9 mm nodule in the anterior left upper lobe (series 7/ image 15, previously 6 mm. Otherwise, discrete nodularity is less conspicuous than on more remote prior studies. No pneumothorax. Musculoskeletal: Status post bilateral mastectomy with reconstruction. Mild degenerative changes of the thoracic spine. No focal osseous lesions. CT ABDOMEN PELVIS FINDINGS Hepatobiliary: Vague heterogeneously enhancing lesion in the anterior right  hepatic dome is difficult to discretely measure but likely measures approximately 4.5 x 4.9 cm (series 5/image 41), previously 5.1 x 5.6 cm. Lesion along the anterior caudate measures approximately 1.6 x 1.3 cm, previously 2.5 x 1.8 cm. Additional areas of heterogeneous perfusion. Gallbladder is mildly thick-walled but underdistended. No intrahepatic or extrahepatic ductal dilatation. Pancreas: 2.7 x 1.8 cm hypoenhancing lesion in the pancreatic head (series 5/ image 57), previously 2.9 x 2.9 cm. Associated atrophy the pancreatic body/ tail. No pancreatic ductal dilatation. Spleen: Within normal limits. Adrenals/Urinary Tract: Adrenal glands are within normal limits. Kidneys are within normal limits.  No hydronephrosis.  Bladder is mildly thick-walled although underdistended. Stomach/Bowel: Stomach is within normal limits. No evidence of bowel obstruction. Normal appendix (series 5/ image 88). Vascular/Lymphatic: No evidence of abdominal aortic aneurysm. Circumaortic left renal vein. Abnormal soft tissue adjacent to the caudate may reflect portacaval lymphadenopathy (series 5/ image 54) but is difficult to discretely measure. Otherwise, no suspicious abdominopelvic lymphadenopathy. Reproductive: Status post hysterectomy. No adnexal masses. Other: Small perisplenic and pelvic ascites, new. No gross peritoneal nodularity. Musculoskeletal: No focal osseous lesions. IMPRESSION: Status post bilateral mastectomy with left axillary lymph node dissection. Pleural/parenchymal pulmonary metastases with suspected lymphangitic spread, as described above, improved. Thoracic lymphadenopathy, improved. Hepatic and pancreatic metastases, improved. Small volume abdominopelvic ascites, new. No gross peritoneal disease. Electronically Signed   By: Sriyesh  Krishnan M.D.   On: 09/28/2015 14:55    ASSESSMENT: 49 y.o. [place] woman  (1)  status post left lumpectomy under the care of Dr. Streck on 11/18/2005 for a 2.5 cm grade 3  invasive ductal carcinoma, ER +70%, PR +41%, HER-2/neu negative, with proliferation marker of 38%. 2 of 23 lymph nodes were involved.  Margins were clear.  (2) status post adjuvant chemotherapy consisting of 6 q. three-week doses of docetaxel/ doxorubicin/ cyclophosphamide given between January 2007 and may of 2007, with last dose on 04/20/2006.  (3) status post radiation therapy to the left breast, completed 07/11/2006  (4) began on tamoxifen in early August 2007 and continued until October 2009. She status post hysterectomy with bilateral salpingo-oophorectomy on 07/15/2008, and was started on letrozole in October of 2009.  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 PET and CT of the chest showed no evidence of metastasis in January 2011.  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 Margins were clear. Patient underwent concurrent left latissimus flap reconstruction and right implant reconstruction.  (7)  status post additional chemotherapy with one cycle of carboplatin/gemcitabine given on 03/11/2010. She then received 5 q. three-week cycles of IV CMF between 03/25/2010 and 06/17/2010.  (8) started on exemestane in January 2012 and continued until late November 2013 when she was hospitalized for apparent TIA at which time her exemestane was stopped and not resumed  (9) METASTATIC DISEASE: evidence of disease recurence noted on chest CT, liver MRI and PET scan in December 2014, with suspicious lung nodules, lymphadenopathy, and 3 lesions in the right lobe of the liver, but no evidence of bony disease and no evidence of brain involvement by brain MRI September 2014.  Biopsy of a left supraclavicular lymph node on 12/11/2013 confirmed metastatic carcinoma, estrogen receptor 45% positive, progesterone receptor 17% positive, with no HER-2 amplification.  (10) fulvestrant started 12/03/2013, discontinued 03/25/2014 with progression  (11) exemestane/ everolimus started 04/04/2014; everolimus held 04/14/2014, with skin rash and mouth soreness; resumed at 5 mg a day as of 04/29/2014, discontinued 05/09/2014 with similar symptoms  (12) continuing exemestane, adding palbociclib 05/26/2014;   (a) palbociclib dose dropped to 75 mg every other day for 21 days beginning with cycle 4  (b) exemestane and Palbociclib discontinued December 2015 with evidence of progression  (13) [facility] referral placed 04/28/2049 -- re-sent 05/23/2014-- shows Pi3KCA and TP53 mutations  (a) [facility] one test requested 12/10/2014-- confirms [facility] results  (b) did not qualify for   capecitabine/ BYL 719 trial because of elevated lipase  (14) started eribulin 12/26/2014, 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   (a) dpse dropped from 2g BID to 1.5g BID starting on 04/26/15  (b) stopped 05/17/2015 (after 4 cycles) with progression  (16) started carboplatin/ gemcitabine 05/26/2015, to be given day one and day 8 of each 21 day cycle  (a) given her history of severe neutropenia with this chemotherapy we'll do Neupogen days 2, 3 and 4 of each cycle and onpro day 9  (b) carbo dose dropped by 25% w cycle 2 due to very low platelet nadir  (c) CT scans after 3 cycles (07/27/2015) show measurable response  (d) CT scans after 6 cycles (09/28/2015)show continuing response  (17) Left pleural effusion_0   (a) s/p L thoracentesis 05/22/2015, 05/29/2015 and 06/05/2015  (b) attempt at pleurx 06/12/2015 cancelled as effusion  loculated and scant  (18) symptomatic anemia  (a) Aranesp started 07/28/2015, retic <1% on 10/06/2015  (b) Feraheme given 08/04/2015; ferritin >2K on 10/06/2015  PLAN:  Arelia likely has significant involvement of her bone marrow by her cancer and that is the reason we are having such trouble getting her counts up.  Aranesp dose was increased  to 500 every 2 weeks instead of every 3 weeks. Her iron supplements was placed on hold, since her ferritin is already quite high.  If we don't get a bump in her reticulocyte count after another 6-8 weeks we will stop the Aranesp and simply transfuse as needed. As of today, the patient's hemoglobin is low at 8, for which she will receive 2 units of blood. In addition, her platelet count is at 9000, the lowest it has ever been. There has, instead of receiving 1 unit of platelets, she will be receiving 2 units, after discussion with Dr. Marjie Skiff who agrees with the plan. She will not have to come here 3 additional days just to get Neupogen.  Nevertheless we are going to continue to see her on a weekly basis because she needs transfusions frequently.  She is already scheduled for restaging studies mid December. I am hopeful we can continue on this schedule a few more months before we have documented progression, at which time we can consider CMF versus Doxil  Mikaella has a good understanding of this plan. She will call with any problems that may develop before her next visit here.  Rondel Jumbo, PA-C   10/20/2015 11:43 AM

## 2015-10-20 NOTE — Telephone Encounter (Signed)
[facility] called today

## 2015-10-21 ENCOUNTER — Ambulatory Visit: Payer: 59

## 2015-10-21 LAB — PREPARE PLATELET PHERESIS
UNIT DIVISION: 0
Unit division: 0

## 2015-10-21 LAB — TYPE AND SCREEN
ABO/RH(D): A POS
ANTIBODY SCREEN: NEGATIVE
Unit division: 0

## 2015-10-22 ENCOUNTER — Other Ambulatory Visit: Payer: Self-pay | Admitting: Oncology

## 2015-10-22 ENCOUNTER — Ambulatory Visit: Payer: 59

## 2015-10-23 ENCOUNTER — Ambulatory Visit: Payer: 59

## 2015-10-23 NOTE — Telephone Encounter (Signed)
Chart reviewed.

## 2015-10-27 ENCOUNTER — Ambulatory Visit (HOSPITAL_BASED_OUTPATIENT_CLINIC_OR_DEPARTMENT_OTHER): Payer: 59

## 2015-10-27 ENCOUNTER — Encounter: Payer: Self-pay | Admitting: Physician Assistant

## 2015-10-27 ENCOUNTER — Ambulatory Visit (HOSPITAL_BASED_OUTPATIENT_CLINIC_OR_DEPARTMENT_OTHER): Payer: 59 | Admitting: Physician Assistant

## 2015-10-27 ENCOUNTER — Ambulatory Visit: Payer: 59

## 2015-10-27 ENCOUNTER — Other Ambulatory Visit (HOSPITAL_BASED_OUTPATIENT_CLINIC_OR_DEPARTMENT_OTHER): Payer: 59

## 2015-10-27 VITALS — BP 137/80 | HR 102 | Temp 98.6°F | Resp 17 | Ht 63.0 in | Wt 178.0 lb

## 2015-10-27 DIAGNOSIS — D709 Neutropenia, unspecified: Secondary | ICD-10-CM

## 2015-10-27 DIAGNOSIS — C787 Secondary malignant neoplasm of liver and intrahepatic bile duct: Secondary | ICD-10-CM

## 2015-10-27 DIAGNOSIS — Z5111 Encounter for antineoplastic chemotherapy: Secondary | ICD-10-CM

## 2015-10-27 DIAGNOSIS — C770 Secondary and unspecified malignant neoplasm of lymph nodes of head, face and neck: Secondary | ICD-10-CM

## 2015-10-27 DIAGNOSIS — Z95828 Presence of other vascular implants and grafts: Secondary | ICD-10-CM

## 2015-10-27 DIAGNOSIS — C50312 Malignant neoplasm of lower-inner quadrant of left female breast: Secondary | ICD-10-CM

## 2015-10-27 DIAGNOSIS — Z5189 Encounter for other specified aftercare: Secondary | ICD-10-CM

## 2015-10-27 DIAGNOSIS — D61810 Antineoplastic chemotherapy induced pancytopenia: Secondary | ICD-10-CM

## 2015-10-27 DIAGNOSIS — C50919 Malignant neoplasm of unspecified site of unspecified female breast: Secondary | ICD-10-CM

## 2015-10-27 DIAGNOSIS — D696 Thrombocytopenia, unspecified: Secondary | ICD-10-CM

## 2015-10-27 DIAGNOSIS — T451X5A Adverse effect of antineoplastic and immunosuppressive drugs, initial encounter: Secondary | ICD-10-CM

## 2015-10-27 LAB — COMPREHENSIVE METABOLIC PANEL (CC13)
ALBUMIN: 2.7 g/dL — AB (ref 3.5–5.0)
ALK PHOS: 156 U/L — AB (ref 40–150)
ALT: 27 U/L (ref 0–55)
ANION GAP: 7 meq/L (ref 3–11)
AST: 40 U/L — AB (ref 5–34)
BUN: 6.5 mg/dL — AB (ref 7.0–26.0)
CALCIUM: 8.3 mg/dL — AB (ref 8.4–10.4)
CO2: 28 mEq/L (ref 22–29)
Chloride: 108 mEq/L (ref 98–109)
Creatinine: 0.7 mg/dL (ref 0.6–1.1)
Glucose: 102 mg/dl (ref 70–140)
POTASSIUM: 3.5 meq/L (ref 3.5–5.1)
Sodium: 142 mEq/L (ref 136–145)
Total Bilirubin: 1.17 mg/dL (ref 0.20–1.20)
Total Protein: 4.8 g/dL — ABNORMAL LOW (ref 6.4–8.3)

## 2015-10-27 LAB — CBC WITH DIFFERENTIAL/PLATELET
BASO%: 0.3 % (ref 0.0–2.0)
BASOS ABS: 0 10*3/uL (ref 0.0–0.1)
EOS ABS: 0 10*3/uL (ref 0.0–0.5)
EOS%: 1.2 % (ref 0.0–7.0)
HEMATOCRIT: 30.7 % — AB (ref 34.8–46.6)
HEMOGLOBIN: 9.7 g/dL — AB (ref 11.6–15.9)
LYMPH#: 0.6 10*3/uL — AB (ref 0.9–3.3)
LYMPH%: 17.9 % (ref 14.0–49.7)
MCH: 33.1 pg (ref 25.1–34.0)
MCHC: 31.6 g/dL (ref 31.5–36.0)
MCV: 104.8 fL — AB (ref 79.5–101.0)
MONO#: 0.6 10*3/uL (ref 0.1–0.9)
MONO%: 17.1 % — ABNORMAL HIGH (ref 0.0–14.0)
NEUT#: 2.2 10*3/uL (ref 1.5–6.5)
NEUT%: 63.5 % (ref 38.4–76.8)
PLATELETS: 36 10*3/uL — AB (ref 145–400)
RBC: 2.93 10*6/uL — ABNORMAL LOW (ref 3.70–5.45)
RDW: 28.6 % — AB (ref 11.2–14.5)
WBC: 3.5 10*3/uL — ABNORMAL LOW (ref 3.9–10.3)

## 2015-10-27 MED ORDER — SODIUM CHLORIDE 0.9 % IV SOLN
200.0000 mg | Freq: Once | INTRAVENOUS | Status: AC
  Administered 2015-10-27: 200 mg via INTRAVENOUS
  Filled 2015-10-27: qty 20

## 2015-10-27 MED ORDER — SODIUM CHLORIDE 0.9 % IJ SOLN
10.0000 mL | INTRAMUSCULAR | Status: DC | PRN
  Administered 2015-10-27: 10 mL via INTRAVENOUS
  Filled 2015-10-27: qty 10

## 2015-10-27 MED ORDER — SODIUM CHLORIDE 0.9 % IV SOLN
800.0000 mg/m2 | Freq: Once | INTRAVENOUS | Status: AC
  Administered 2015-10-27: 1520 mg via INTRAVENOUS
  Filled 2015-10-27: qty 39.98

## 2015-10-27 MED ORDER — SODIUM CHLORIDE 0.9 % IV SOLN
Freq: Once | INTRAVENOUS | Status: AC
  Administered 2015-10-27: 12:00:00 via INTRAVENOUS
  Filled 2015-10-27: qty 4

## 2015-10-27 MED ORDER — PEGFILGRASTIM 6 MG/0.6ML ~~LOC~~ PSKT
6.0000 mg | PREFILLED_SYRINGE | Freq: Once | SUBCUTANEOUS | Status: AC
  Administered 2015-10-27: 6 mg via SUBCUTANEOUS
  Filled 2015-10-27: qty 0.6

## 2015-10-27 MED ORDER — CARBOPLATIN CHEMO INTRADERMAL TEST DOSE 100MCG/0.02ML
100.0000 ug | Freq: Once | INTRADERMAL | Status: AC
  Administered 2015-10-27: 100 ug via INTRADERMAL
  Filled 2015-10-27: qty 0.01

## 2015-10-27 MED ORDER — HEPARIN SOD (PORK) LOCK FLUSH 100 UNIT/ML IV SOLN
500.0000 [IU] | Freq: Once | INTRAVENOUS | Status: AC | PRN
  Administered 2015-10-27: 500 [IU]
  Filled 2015-10-27: qty 5

## 2015-10-27 MED ORDER — SODIUM CHLORIDE 0.9 % IV SOLN
Freq: Once | INTRAVENOUS | Status: AC
  Administered 2015-10-27: 11:00:00 via INTRAVENOUS

## 2015-10-27 MED ORDER — SODIUM CHLORIDE 0.9 % IJ SOLN
10.0000 mL | INTRAMUSCULAR | Status: DC | PRN
  Administered 2015-10-27: 10 mL
  Filled 2015-10-27: qty 10

## 2015-10-27 MED ORDER — DARBEPOETIN ALFA 500 MCG/ML IJ SOSY
500.0000 ug | PREFILLED_SYRINGE | Freq: Once | INTRAMUSCULAR | Status: AC
  Administered 2015-10-27: 500 ug via SUBCUTANEOUS
  Filled 2015-10-27: qty 1

## 2015-10-27 NOTE — Progress Notes (Signed)
Ok to treat per Sharene Butters

## 2015-10-27 NOTE — Progress Notes (Signed)
ID: Kristin Hoffman OB: 09/03/1966  MR#: [id number]  CSN#:[id number]  PCP: POLLOCK, NELSON, MD GYN:  James Tomblin SU: Christian Streck OTHER MD: Matthew Manning, E. Claire Dees  CHIEF COMPLAINT:  Stage IV breast cancer  CURRENT TREATMENT: carboplatin/ gemcitabine/ aranesp  BREAST CANCER HISTORY: This patient was previously followed by Dr. Peter Rubin, and was transferred to Dr. Magrinat's service as of 08/20/2013.  At the age of 39, the patient had a screening mammogram as a baseline. She had recently given birth and was on birth control pills at that time. The mammogram in November 2006 showed an area of architectural distortion in the left lower inner quadrant. Subsequently an ultrasound was obtained of the left breast showing a 2.0 x 1.5 x 1.4 cm mass, at the 8:00 position, 4 cm from the nipple. A core biopsy performed on 10/21/2005 showed an invasive mammary carcinoma, ER +70%, PR +41%, HER-2/neu negative, with proliferation marker of 38%. ([id number])  Breast MRI on 11/08/2005 confirmed a 2.5 cm spiculated mass in the left lower inner quadrant with 3 small satellite nodules adjacent to the primary mass: 3.5 cm posterior medial to the primary mass, measuring 9 mm; 1.5 cm anterior medial to the primary mass measuring 5 mm; and 2 cm medial to the primary mass measuring 5 mm. No axillary adenopathy was noted. No suspicious masses or enhancement are noted in the right breast.  Kristin Hoffman underwentt left lumpectomy under the care of Dr. 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 2 of 23 lymph nodes were involved.  Margins were clear.  She received adjuvant chemotherapy consisting of 6 q. three-week doses of docetaxel/doxorubicin/cyclophosphamide given between January 2007 and may of 2007, with last dose on 04/20/2006. She underwent radiation therapy completed 07/11/2006, after which she began on tamoxifen in  early August 2007.  She underwent hysterectomy with bilateral salpingo-oophorectomy on 07/15/2008, and was started on letrozole in October of 2009.  A mammogram 11/10/2009 showed microcalcifications in the left breast, and a subsequent biopsy on 11/18/2009 confirmed invasive mammary carcinoma in the left breast. PET and CT of the chest showed no evidence of metastasis in January 2011.  She underwent bilateral mastectomies 01/25/2010, with the right breast showing no evidence of malignancy; in the left breast showing a 1.0 cm grade 2 invasive ductal carcinoma with high-grade DCIS. Tumor was ER +22%, PR negative, HER-2/neu negative, with proliferation marker of 13%. Margins were clear. Patient underwent concurrent left latissimus flap reconstruction and right implant reconstruction.  She received additional chemotherapy with one cycle of carboplatin/gemcitabine given on 03/11/2010. She then received 5 q. three-week cycles of IV CMF between 03/25/2010 and 06/17/2010.  She was started on exemestane in January 2012 and continued until late November 2013 when she was hospitalized for apparent TIA.   Her subsequent history is as detailed below  INTERVAL HISTORY: Kristin Hoffman returns today for follow up of her metastatic breast cancer. She received D1 C7 of carboplatin and gemcitabine given originally on days 1 and 8 of each cycle. On 10/13/15,  the dose was changed to every 2 weeks because her counts simply do not recover fast enough despite growth factors. She will receive onpro with each treatment, but she will not receive Neupogen anymore since the treatments have been moved to every 2 weeks.   REVIEW OF SYSTEMS: She is feeling overall well without major complaints. She has occasional nose bleeds, otherwise, she denies any other   bleeding sources such as gum bleed, hematochezia, hematuria, hemoptysis, or hematemesis. She continues to sleep poorly, she has pain under the left breast in the rib cage whenever she turns  to wipe out a bowel movement and she also has left scapular pain, neither which is more frequent or intense than before. She denies any shortness of breath, or cardiac chest pain. She denies any nausea or vomiting. She denies any diarrhea or constipation. She denies any dysuria. She bruises easily. She feels very forgetful. She takes naps most days. There has been no intercurrent fever however. There have been no unusual headaches. She denies any new rashes or mucositis. A detailed review of systems today was otherwise stable  PAST MEDICaL HISTORY: Past Medical History  Diagnosis Date  . Depression   . Reflux   . Hx-TIA (transient ischemic attack) 11/22/2013  . Chronic headaches 11/22/2013  . Anxiety 11/22/2013  . Breast cancer (HCC)   . Cancer (HCC)   . Breast cancer metastasized to multiple sites (HCC) 12/24/2013    PAST SURGICAL HISTORY: Past Surgical History  Procedure Laterality Date  . Mastectomy w/ nodes partial    . Mastectomy    . Breast reconstruction    . Portacath placement      x 2  . Portacath removal      x 2  . Abdominal hysterectomy    . Tee without cardioversion  11/12/2012    Procedure: TRANSESOPHAGEAL ECHOCARDIOGRAM (TEE);  Surgeon: Mark Skains, MD;  Location: [facility];  Service: Cardiovascular;  Laterality: N/A;  This TEE may be Dr. Skains or a LHC Dr    FAMILY HISTORY Both parents are alive and well. Patient has one sister who is 10 years younger and is in good health. No other history of breast or ovarian cancer in the family. Family History  Problem Relation Age of Onset  . Diabetes Mellitus II Neg Hx   . Hypertension Neg Hx     GYNECOLOGIC HISTORY:   (Updated January 2015) G2P2, menarche at age 11 with irregular menses. On birth control pills in the past. On Clomid to induce ovulation with first pregnancy. Also had preeclampsia with first pregnancy. Status post hysterectomy and bilateral salpingo-oophorectomy in August 2009.  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 She is originally from [place]. She's been married to Greg, for 13 years. He works as an electronics technician at [employer].   ADVANCED DIRECTIVES:  Not in place. At the clinic visit 10/27/2015 the patient was given the appropriate forms to complete and notarize at her discretion.    HEALTH MAINTENANCE:  (Updated 11/22/2013) Social History  Substance Use Topics  . Smoking status: Never Smoker   . Smokeless tobacco: Never Used  . Alcohol Use: Yes     Comment: rarely     Colonoscopy:  Never  PAP: S/P LAVH/BSO in August 2009  Bone density: Never  Lipid panel: Not on file   Allergies  Allergen Reactions  . Afinitor [Everolimus] Hives and Itching    Current Outpatient Prescriptions  Medication Sig Dispense Refill  . acidophilus (RISAQUAD) CAPS capsule Take 1 capsule by mouth every morning.     . Biotin 5000 MCG CAPS Take 5,000 mcg by mouth every morning.  30 capsule   . cetirizine (ZYRTEC) 10 MG tablet Take 10 mg by mouth at bedtime.     . Cholecalciferol 2000 UNITS CHEW Chew   1 tablet by mouth daily with breakfast.     . diphenoxylate-atropine (LOMOTIL) 2.5-0.025 MG tablet TAKE 2 TABLETS BY MOUTH 4 TIMES A DAY AS NEEDED 30 tablet 0  . escitalopram (LEXAPRO) 20 MG tablet Take 1 tablet (20 mg total) by mouth daily. 30 tablet 12  . Ibuprofen (ADVIL) 200 MG CAPS Take 2 capsules (400 mg total) by mouth 3 (three) times daily with meals as needed. 120 each 0  . LORazepam (ATIVAN) 0.5 MG tablet Take 1 tablet (0.5 mg total) by mouth every 6 (six) hours as needed for anxiety. 120 tablet 1  . Melatonin 10 MG TABS Take 1 tablet by mouth at bedtime.    . montelukast (SINGULAIR) 10 MG tablet Take 10 mg by mouth daily with breakfast.     . Multiple Vitamin (MULTIVITAMIN) tablet Take 1 tablet by mouth every morning.     . nystatin (MYCOSTATIN) 100000 UNIT/ML suspension Take 5 mLs  (500,000 Units total) by mouth 4 (four) times daily. 240 mL 1  . omeprazole (PRILOSEC) 40 MG capsule Take 40 mg by mouth every morning.     . ondansetron (ZOFRAN) 8 MG tablet TAKE 1 TABLET BY MOUTH 2 TIMES DAILY. START DAY AFTER CHEMO FOR 2 DAYS. THEN TAKE AS NEEDED NAUSEA 30 tablet 1  . prochlorperazine (COMPAZINE) 10 MG tablet Take 10 mg by mouth every 6 (six) hours as needed for nausea or vomiting.     . traMADol (ULTRAM) 50 MG tablet Take 1 tablet (50 mg total) by mouth every 6 (six) hours as needed. 120 tablet 1   No current facility-administered medications for this visit.   Facility-Administered Medications Ordered in Other Visits  Medication Dose Route Frequency Provider Last Rate Last Dose  . Darbepoetin Alfa (ARANESP) injection 500 mcg  500 mcg Subcutaneous Once Chauncey Cruel, MD      . heparin lock flush 100 unit/mL  500 Units Intracatheter Once PRN Chauncey Cruel, MD      . sodium chloride 0.9 % injection 10 mL  10 mL Intracatheter PRN Chauncey Cruel, MD   10 mL at 09/15/15 1524  . sodium chloride 0.9 % injection 10 mL  10 mL Intracatheter PRN Chauncey Cruel, MD        OBJECTIVE: Young white woman in no acute distress Filed Vitals:   10/27/15 1034  BP: 137/80  Pulse: 102  Temp: 98.6 F (37 C)  Resp: 17  Body mass index is 31.54 kg/(m^2).  ECOG: 2 Filed Weights   10/27/15 1034  Weight: 178 lb (80.74 kg)   Sclerae unicteric, pupils round and equal Oropharynx showsno thrush No cervical or supraclavicular adenopathy Lungs no rales or rhonchi Heart regular rate and rhythm Abd soft, nontender, positive bowel sounds MSK no focal spinal tenderness, minimal bilateral ankle edema Neuro: nonfocal, well oriented, appropriate affect Breasts: Deferred Skin: The port is Normal There is no tenderness to palpation and no swelling. Minimal petechia noted on the dorsal aspect of her hands.   LAB RESULTS:  CBC Latest Ref Rng 10/27/2015 10/20/2015 10/13/2015  WBC 3.9 -  10.3 10e3/uL 3.5(L) 7.1 1.9(L)  Hemoglobin 11.6 - 15.9 g/dL 9.7(L) 8.0(L) 9.1(L)  Hematocrit 34.8 - 46.6 % 30.7(L) 25.2(L) 28.0(L)  Platelets 145 - 400 10e3/uL 36(L) 9(LL) 58(L)   CMP Latest Ref Rng 10/27/2015 10/20/2015 10/13/2015  Glucose 70 - 140 mg/dl 102 107 86  BUN 7.0 - 26.0 mg/dL 6.5(L) 12.6 11.6  Creatinine 0.6 - 1.1 mg/dL 0.7 0.6 0.6  Sodium  136 - 145 mEq/L 142 142 141  Potassium 3.5 - 5.1 mEq/L 3.5 3.9 3.7  Chloride 101 - 111 mmol/L - - -  CO2 22 - 29 mEq/L 28 29 28  Calcium 8.4 - 10.4 mg/dL 8.3(L) 8.3(L) 8.5  Total Protein 6.4 - 8.3 g/dL 4.8(L) 4.4(L) 4.8(L)  Total Bilirubin 0.20 - 1.20 mg/dL 1.17 0.94 1.02  Alkaline Phos 40 - 150 U/L 156(H) 169(H) 136  AST 5 - 34 U/L 40(H) 37(H) 48(H)  ALT 0 - 55 U/L 27 32 34    STUDIES: Ct Head W Wo Contrast  09/28/2015  CLINICAL DATA:  Restaging breast cancer. Widespread metastatic disease. EXAM: CT HEAD WITHOUT AND WITH CONTRAST TECHNIQUE: Contiguous axial images were obtained from the base of the skull through the vertex without and with intravenous contrast CONTRAST:  100mL OMNIPAQUE IOHEXOL 300 MG/ML  SOLN COMPARISON:  MRI brain 08/20/2013.  Body CT reported separately. FINDINGS: No evidence for acute infarction, hemorrhage, mass lesion, hydrocephalus, or extra-axial fluid. Slight premature for age cerebral and cerebellar atrophy. Slight hypoattenuation of white matter, likely small vessel disease or post treatment effect. Stable chronic cortical and subcortical infarcts. Post infusion, no abnormal enhancement of the brain or visible meninges. No destructive calvarial lesions. Incidental Pacchionian granulations around the torcula are stable from prior MR. IMPRESSION: Premature for age atrophy. No acute intracranial findings. No visible metastatic disease to the brain, meninges, or calvarium. Electronically Signed   By: John T Curnes M.D.   On: 09/28/2015 14:54   Ct Chest W Contrast  09/28/2015  CLINICAL DATA:  Metastatic breast cancer  status post bilateral mastectomy, chemotherapy ongoing. EXAM: CT CHEST, ABDOMEN, AND PELVIS WITH CONTRAST TECHNIQUE: Multidetector CT imaging of the chest, abdomen and pelvis was performed following the standard protocol during bolus administration of intravenous contrast. CONTRAST:  100mL OMNIPAQUE IOHEXOL 300 MG/ML  SOLN COMPARISON:  07/27/2015 FINDINGS: CT CHEST FINDINGS Mediastinum/Nodes: The heart is normal in size. No pericardial effusion. Aberrant right subclavian artery. Right chest port terminates in the upper right atrium. Improving thoracic nodal metastases, including: --1.4 cm short axis left supraclavicular node (series 5/image 7), previously 2.5 cm --1.2 cm short axis necrotic high right paratracheal node (series 5/image 14), previously 1.8 cm --1.5 cm short axis necrotic left prevascular node (series 5/image 17), previously 2.3 cm Additional small calcified mediastinal nodes, benign. Status post left axillary lymph node dissection. Visualized thyroid is unremarkable. Lungs/Pleura: 1.5 x 1.5 cm necrotic pleural-based nodule along the anterior right middle lobe (series 5/image 28), previously 1.8 x 2.3 cm. Associated small right pleural effusion along the major fissure and posterior lung base. 8.1 x 2.8 cm necrotic mass along the anterior left upper lobe/chest wall (series 5/ image 20), previously 7.8 x 4.0 cm. Associated small left pleural effusion, some of which is loculated anteriorly along the left chest wall mass. Again noted is interlobular septal thickening with scattered pulmonary nodularity, worrisome for lymphangitic spread of tumor. 9 mm nodule in the anterior left upper lobe (series 7/ image 15, previously 6 mm. Otherwise, discrete nodularity is less conspicuous than on more remote prior studies. No pneumothorax. Musculoskeletal: Status post bilateral mastectomy with reconstruction. Mild degenerative changes of the thoracic spine. No focal osseous lesions. CT ABDOMEN PELVIS FINDINGS  Hepatobiliary: Vague heterogeneously enhancing lesion in the anterior right hepatic dome is difficult to discretely measure but likely measures approximately 4.5 x 4.9 cm (series 5/image 41), previously 5.1 x 5.6 cm. Lesion along the anterior caudate measures approximately 1.6 x 1.3 cm, previously 2.5   x 1.8 cm. Additional areas of heterogeneous perfusion. Gallbladder is mildly thick-walled but underdistended. No intrahepatic or extrahepatic ductal dilatation. Pancreas: 2.7 x 1.8 cm hypoenhancing lesion in the pancreatic head (series 5/ image 57), previously 2.9 x 2.9 cm. Associated atrophy the pancreatic body/ tail. No pancreatic ductal dilatation. Spleen: Within normal limits. Adrenals/Urinary Tract: Adrenal glands are within normal limits. Kidneys are within normal limits.  No hydronephrosis. Bladder is mildly thick-walled although underdistended. Stomach/Bowel: Stomach is within normal limits. No evidence of bowel obstruction. Normal appendix (series 5/ image 88). Vascular/Lymphatic: No evidence of abdominal aortic aneurysm. Circumaortic left renal vein. Abnormal soft tissue adjacent to the caudate may reflect portacaval lymphadenopathy (series 5/ image 54) but is difficult to discretely measure. Otherwise, no suspicious abdominopelvic lymphadenopathy. Reproductive: Status post hysterectomy. No adnexal masses. Other: Small perisplenic and pelvic ascites, new. No gross peritoneal nodularity. Musculoskeletal: No focal osseous lesions. IMPRESSION: Status post bilateral mastectomy with left axillary lymph node dissection. Pleural/parenchymal pulmonary metastases with suspected lymphangitic spread, as described above, improved. Thoracic lymphadenopathy, improved. Hepatic and pancreatic metastases, improved. Small volume abdominopelvic ascites, new. No gross peritoneal disease. Electronically Signed   By: Sriyesh  Krishnan M.D.   On: 09/28/2015 14:55   Ct Abdomen Pelvis W Contrast  09/28/2015  CLINICAL DATA:   Metastatic breast cancer status post bilateral mastectomy, chemotherapy ongoing. EXAM: CT CHEST, ABDOMEN, AND PELVIS WITH CONTRAST TECHNIQUE: Multidetector CT imaging of the chest, abdomen and pelvis was performed following the standard protocol during bolus administration of intravenous contrast. CONTRAST:  100mL OMNIPAQUE IOHEXOL 300 MG/ML  SOLN COMPARISON:  07/27/2015 FINDINGS: CT CHEST FINDINGS Mediastinum/Nodes: The heart is normal in size. No pericardial effusion. Aberrant right subclavian artery. Right chest port terminates in the upper right atrium. Improving thoracic nodal metastases, including: --1.4 cm short axis left supraclavicular node (series 5/image 7), previously 2.5 cm --1.2 cm short axis necrotic high right paratracheal node (series 5/image 14), previously 1.8 cm --1.5 cm short axis necrotic left prevascular node (series 5/image 17), previously 2.3 cm Additional small calcified mediastinal nodes, benign. Status post left axillary lymph node dissection. Visualized thyroid is unremarkable. Lungs/Pleura: 1.5 x 1.5 cm necrotic pleural-based nodule along the anterior right middle lobe (series 5/image 28), previously 1.8 x 2.3 cm. Associated small right pleural effusion along the major fissure and posterior lung base. 8.1 x 2.8 cm necrotic mass along the anterior left upper lobe/chest wall (series 5/ image 20), previously 7.8 x 4.0 cm. Associated small left pleural effusion, some of which is loculated anteriorly along the left chest wall mass. Again noted is interlobular septal thickening with scattered pulmonary nodularity, worrisome for lymphangitic spread of tumor. 9 mm nodule in the anterior left upper lobe (series 7/ image 15, previously 6 mm. Otherwise, discrete nodularity is less conspicuous than on more remote prior studies. No pneumothorax. Musculoskeletal: Status post bilateral mastectomy with reconstruction. Mild degenerative changes of the thoracic spine. No focal osseous lesions. CT ABDOMEN  PELVIS FINDINGS Hepatobiliary: Vague heterogeneously enhancing lesion in the anterior right hepatic dome is difficult to discretely measure but likely measures approximately 4.5 x 4.9 cm (series 5/image 41), previously 5.1 x 5.6 cm. Lesion along the anterior caudate measures approximately 1.6 x 1.3 cm, previously 2.5 x 1.8 cm. Additional areas of heterogeneous perfusion. Gallbladder is mildly thick-walled but underdistended. No intrahepatic or extrahepatic ductal dilatation. Pancreas: 2.7 x 1.8 cm hypoenhancing lesion in the pancreatic head (series 5/ image 57), previously 2.9 x 2.9 cm. Associated atrophy the pancreatic body/ tail. No pancreatic ductal dilatation.   Spleen: Within normal limits. Adrenals/Urinary Tract: Adrenal glands are within normal limits. Kidneys are within normal limits.  No hydronephrosis. Bladder is mildly thick-walled although underdistended. Stomach/Bowel: Stomach is within normal limits. No evidence of bowel obstruction. Normal appendix (series 5/ image 88). Vascular/Lymphatic: No evidence of abdominal aortic aneurysm. Circumaortic left renal vein. Abnormal soft tissue adjacent to the caudate may reflect portacaval lymphadenopathy (series 5/ image 54) but is difficult to discretely measure. Otherwise, no suspicious abdominopelvic lymphadenopathy. Reproductive: Status post hysterectomy. No adnexal masses. Other: Small perisplenic and pelvic ascites, new. No gross peritoneal nodularity. Musculoskeletal: No focal osseous lesions. IMPRESSION: Status post bilateral mastectomy with left axillary lymph node dissection. Pleural/parenchymal pulmonary metastases with suspected lymphangitic spread, as described above, improved. Thoracic lymphadenopathy, improved. Hepatic and pancreatic metastases, improved. Small volume abdominopelvic ascites, new. No gross peritoneal disease. Electronically Signed   By: Julian Hy M.D.   On: 09/28/2015 14:55    ASSESSMENT: 49 y.o. [place] woman  (1)   status post left lumpectomy under the care of Dr. 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 2 of 23 lymph nodes were involved.  Margins were clear.  (2) status post adjuvant chemotherapy consisting of 6 q. three-week doses of docetaxel/ doxorubicin/ cyclophosphamide given between January 2007 and may of 2007, with last dose on 04/20/2006.  (3) status post radiation therapy to the left breast, completed 07/11/2006  (4) began on tamoxifen in early August 2007 and continued until October 2009. She status post hysterectomy with bilateral salpingo-oophorectomy on 07/15/2008, and was started on letrozole in October of 2009.  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 PET and CT of the chest showed no evidence of metastasis in January 2011.  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 Margins were clear. Patient underwent concurrent left latissimus flap reconstruction and right implant reconstruction.  (7)  status post additional chemotherapy with one cycle of carboplatin/gemcitabine given on 03/11/2010. She then received 5 q. three-week cycles of IV CMF between 03/25/2010 and 06/17/2010.  (8) started on exemestane in January 2012 and continued until late November 2013 when she was hospitalized for apparent TIA at which time her exemestane was stopped and not resumed  (9) METASTATIC DISEASE: evidence of disease recurence noted on chest CT, liver MRI and PET scan in December 2014, with suspicious lung nodules, lymphadenopathy, and 3 lesions in the right lobe of the liver, but no  evidence of bony disease and no evidence of brain involvement by brain MRI September 2014. Biopsy of a left supraclavicular lymph node on 12/11/2013 confirmed metastatic carcinoma, estrogen receptor 45% positive, progesterone receptor 17% positive, with no HER-2 amplification.  (10) fulvestrant started 12/03/2013, discontinued 03/25/2014 with progression  (11) exemestane/ everolimus started 04/04/2014; everolimus held 04/14/2014, with skin rash and mouth soreness; resumed at 5 mg a day as of 04/29/2014, discontinued 05/09/2014 with similar symptoms  (12) continuing exemestane, adding palbociclib 05/26/2014;   (a) palbociclib dose dropped to 75 mg every other day for 21 days beginning with cycle 4  (b) exemestane and Palbociclib discontinued December 2015 with evidence of progression  (13) [facility] referral placed 04/28/2049 -- re-sent 05/23/2014-- shows  Pi3KCA and TP53 mutations  (a) [facility] one test requested 12/10/2014-- confirms [facility] results  (b) did not qualify for capecitabine/ BYL 719 trial because of elevated lipase  (14) started eribulin 12/26/2014, 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   (a) dpse dropped from 2g BID to 1.5g BID starting on 04/26/15  (b) stopped 05/17/2015 (after 4 cycles) with progression  (16) started carboplatin/ gemcitabine 05/26/2015, to be given day one and day 8 of each 21 day cycle  (a) given her history of severe neutropenia with this chemotherapy we'll do Neupogen days 2, 3 and 4 of each cycle and onpro day 9  (b) carbo dose dropped by 25% w cycle 2 due to very low platelet nadir  (c) CT scans after 3 cycles (07/27/2015) show measurable response  (d) CT scans after 6 cycles (09/28/2015)show continuing response  (17) Left pleural effusion\   (a) s/p L thoracentesis 05/22/2015,  05/29/2015 and 06/05/2015  (b) attempt at pleurx 06/12/2015 cancelled as effusion loculated and scant  (18) symptomatic anemia  (a) Aranesp started 07/28/2015, retic <1% on 10/06/2015  (b) Feraheme given 08/04/2015; ferritin >2K on 10/06/2015, last dose on 11/1  PLAN:  Temitope likely has significant involvement of her bone marrow by her cancer and that is the reason we are having such trouble getting her counts up.  Aranesp dose was increased  to 500 every 2 weeks instead of every 3 weeks. Her iron supplements was placed on hold, since her ferritin is already quite high.  If we don't get a bump in her reticulocyte count after another 4 weeks we will stop the Aranesp and simply transfuse as needed. As of today, the patient's hemoglobin is at  8.9. Will not be receiving blood Her platelet count has not improved. However, not continuing with her chemotherapy as planned, could have a detrimental effect on her treatment. Discussed the case with Dr. Magrinat, We will recommended to proceed with day 1 cycle 8 of chemotherapy, as long as she can receive 1 unit of platelets on 10/29/2015, and repeat labs on 10/30/2015 to monitor for any changes. The patient is not bleeding. As for her neutropenia, her last Neulasta was given on 11/1, holding on well. She will receive Neulasta as scheduled. We will continue to see her on a weekly basis because she needs transfusions frequently.  She is already scheduled for restaging studies mid December.We are hopeful we can continue on this schedule a few more months before we have documented progression, at which time we can consider CMF versus Doxil  Donn has a good understanding of this plan. She will call with any problems that may develop before her next visit here.  WERTMAN,SARA E, PA-C   10/27/2015 11:32 AM    

## 2015-10-27 NOTE — Patient Instructions (Signed)
[facility] Discharge Instructions for Patients Receiving Chemotherapy  Today you received the following chemotherapy agents Gemzar/Carboplatin/Aranesp  To help prevent nausea and vomiting after your treatment, we encourage you to take your nausea medication as prescribed.   If you develop nausea and vomiting that is not controlled by your nausea medication, call the clinic.   BELOW ARE SYMPTOMS THAT SHOULD BE REPORTED IMMEDIATELY:  *FEVER GREATER THAN 100.5 F  *CHILLS WITH OR WITHOUT FEVER  NAUSEA AND VOMITING THAT IS NOT CONTROLLED WITH YOUR NAUSEA MEDICATION  *UNUSUAL SHORTNESS OF BREATH  *UNUSUAL BRUISING OR BLEEDING  TENDERNESS IN MOUTH AND THROAT WITH OR WITHOUT PRESENCE OF ULCERS  *URINARY PROBLEMS  *BOWEL PROBLEMS  UNUSUAL RASH Items with * indicate a potential emergency and should be followed up as soon as possible.  Feel free to call the clinic you have any questions or concerns. The clinic phone number is [phone].  Please show the [facility] at check-in to the Emergency Department and triage nurse.

## 2015-10-27 NOTE — Patient Instructions (Signed)

## 2015-10-28 ENCOUNTER — Telehealth: Payer: Self-pay | Admitting: Oncology

## 2015-10-28 ENCOUNTER — Telehealth: Payer: Self-pay | Admitting: *Deleted

## 2015-10-28 NOTE — Telephone Encounter (Signed)
Per staff message and POF I have scheduled appts. Advised scheduler of appts moved to 11/18 due to no available  JMW

## 2015-10-28 NOTE — Telephone Encounter (Signed)
Kristin Hoffman and advised on 11.18 appts.Marland KitchenMarland KitchenMarland KitchenMarland Kitchenpt ok anda ware

## 2015-10-30 ENCOUNTER — Other Ambulatory Visit: Payer: Self-pay | Admitting: *Deleted

## 2015-10-30 ENCOUNTER — Ambulatory Visit (HOSPITAL_BASED_OUTPATIENT_CLINIC_OR_DEPARTMENT_OTHER): Payer: 59

## 2015-10-30 ENCOUNTER — Other Ambulatory Visit (HOSPITAL_BASED_OUTPATIENT_CLINIC_OR_DEPARTMENT_OTHER): Payer: 59

## 2015-10-30 ENCOUNTER — Other Ambulatory Visit: Payer: 59

## 2015-10-30 VITALS — BP 124/79 | HR 94 | Temp 99.2°F | Resp 18

## 2015-10-30 DIAGNOSIS — C50312 Malignant neoplasm of lower-inner quadrant of left female breast: Secondary | ICD-10-CM

## 2015-10-30 DIAGNOSIS — D6481 Anemia due to antineoplastic chemotherapy: Secondary | ICD-10-CM | POA: Diagnosis not present

## 2015-10-30 DIAGNOSIS — T451X5A Adverse effect of antineoplastic and immunosuppressive drugs, initial encounter: Secondary | ICD-10-CM

## 2015-10-30 DIAGNOSIS — D61810 Antineoplastic chemotherapy induced pancytopenia: Secondary | ICD-10-CM

## 2015-10-30 DIAGNOSIS — D696 Thrombocytopenia, unspecified: Secondary | ICD-10-CM

## 2015-10-30 DIAGNOSIS — D649 Anemia, unspecified: Secondary | ICD-10-CM | POA: Diagnosis not present

## 2015-10-30 LAB — CBC WITH DIFFERENTIAL/PLATELET
BASO%: 0.3 % (ref 0.0–2.0)
BASOS ABS: 0 10*3/uL (ref 0.0–0.1)
EOS ABS: 0.1 10*3/uL (ref 0.0–0.5)
EOS%: 0.4 % (ref 0.0–7.0)
HCT: 29.5 % — ABNORMAL LOW (ref 34.8–46.6)
HEMOGLOBIN: 9.5 g/dL — AB (ref 11.6–15.9)
LYMPH%: 3.1 % — AB (ref 14.0–49.7)
MCH: 34.2 pg — AB (ref 25.1–34.0)
MCHC: 32.1 g/dL (ref 31.5–36.0)
MCV: 106.6 fL — AB (ref 79.5–101.0)
MONO#: 0.2 10*3/uL (ref 0.1–0.9)
MONO%: 1.9 % (ref 0.0–14.0)
NEUT#: 11 10*3/uL — ABNORMAL HIGH (ref 1.5–6.5)
NEUT%: 94.3 % — ABNORMAL HIGH (ref 38.4–76.8)
Platelets: 37 10*3/uL — ABNORMAL LOW (ref 145–400)
RBC: 2.77 10*6/uL — ABNORMAL LOW (ref 3.70–5.45)
RDW: 31.6 % — AB (ref 11.2–14.5)
WBC: 11.7 10*3/uL — ABNORMAL HIGH (ref 3.9–10.3)
lymph#: 0.4 10*3/uL — ABNORMAL LOW (ref 0.9–3.3)

## 2015-10-30 LAB — HOLD TUBE, BLOOD BANK

## 2015-10-30 MED ORDER — HEPARIN SOD (PORK) LOCK FLUSH 100 UNIT/ML IV SOLN
500.0000 [IU] | Freq: Once | INTRAVENOUS | Status: AC
  Administered 2015-10-30: 500 [IU] via INTRAVENOUS
  Filled 2015-10-30: qty 5

## 2015-10-30 MED ORDER — SODIUM CHLORIDE 0.9 % IJ SOLN
10.0000 mL | INTRAMUSCULAR | Status: DC | PRN
  Administered 2015-10-30: 10 mL via INTRAVENOUS
  Filled 2015-10-30: qty 10

## 2015-10-30 MED ORDER — DIPHENHYDRAMINE HCL 25 MG PO CAPS
25.0000 mg | ORAL_CAPSULE | Freq: Once | ORAL | Status: AC
  Administered 2015-10-30: 25 mg via ORAL

## 2015-10-30 MED ORDER — ACETAMINOPHEN 325 MG PO TABS
ORAL_TABLET | ORAL | Status: AC
  Filled 2015-10-30: qty 2

## 2015-10-30 MED ORDER — SODIUM CHLORIDE 0.9 % IV SOLN
250.0000 mL | Freq: Once | INTRAVENOUS | Status: AC
  Administered 2015-10-30: 250 mL via INTRAVENOUS

## 2015-10-30 MED ORDER — DIPHENHYDRAMINE HCL 25 MG PO CAPS
ORAL_CAPSULE | ORAL | Status: AC
  Filled 2015-10-30: qty 1

## 2015-10-30 MED ORDER — ACETAMINOPHEN 325 MG PO TABS
650.0000 mg | ORAL_TABLET | Freq: Once | ORAL | Status: AC
  Administered 2015-10-30: 650 mg via ORAL

## 2015-10-30 NOTE — Patient Instructions (Signed)
Platelet Transfusion  A platelet transfusion is a procedure in which you receive donated platelets through an IV tube. Platelets are tiny pieces of blood cells. When a blood vessel is damaged, platelets collect in the damaged area to help form a blood clot. This begins the healing process. If your platelet count gets too low, your blood may have trouble clotting.  You may need a platelet transfusion if you have a condition that causes a low number of platelets (thrombocytopenia). A platelet transfusion may be used to stop or prevent bleeding.  LET YOUR HEALTH CARE PROVIDER KNOW ABOUT:   Any allergies you have.   All medicines you are taking, including vitamins, herbs, eye drops, creams, and over-the-counter medicines.   Previous problems you or members of your family have had with the use of anesthetics.   Any blood disorders you have.   Previous surgeries you have had.   Any medical conditions you may have.   Any reactions you have had during a previous transfusion. RISKS AND COMPLICATIONS Generally, this is a safe procedure. However, problems may occur, including:   Fever with or without chills. The fever usually occurs within the first 4 hours of the transfusion and returns to normal within 48 hours.  Allergic reaction. The reaction is most commonly caused by antibodies your body creates against substances in the transfusion. Signs of an allergic reaction may include itching, hives, difficulty breathing, shock, or low blood pressure.  Sudden (acute) or delayed hemolytic reaction. This rare reaction can occur during the transfusion and up to 28 days after the transfusion. The reaction usually occurs when your body's defense system (immune system) attacks the new platelets. Signs of a hemolytic reaction may include fever, headache, difficulty breathing, low blood pressure, a rapid heartbeat, or pain in your back, abdomen, chest, or IV site.  Transfusion-related acute lung injury  (TRALI). TRALI can occur within hours of a transfusion, or several days later. This is a rare reaction that causes lung damage. The cause is not known.  Infection. Signs of this rare complication may include fever, chills, vomiting, a rapid heartbeat, or low blood pressure. BEFORE THE PROCEDURE   You may have a blood test to determine your blood type. This is necessary to find out what kind ofplatelets best matches your platelets.  If you have had an allergic reaction to a transfusion in the past, you may be given medicine to help prevent a reaction. Take this medicine only as directed by your health care provider.  Your temperature, blood pressure, and pulse will be monitored before the transfusion. PROCEDURE  An IV will be started in your hand or arm.  The transfusion will be attached to your IV tubing. The bag of donated platelets will be attached to your IV tube andgiven into your vein.  Your temperature, blood pressure, and pulse will be monitored regularly during the transfusion. This monitoring is done to help detect early signs of a transfusion reaction.  If you have any signs or symptoms of a reaction, your transfusion will be stopped and you may be given medicine.  When your transfusion is complete, your IV will be removed.  Pressure may be applied to the IV site for a few minutes.  A bandage (dressing) will be applied. The procedure may vary among health care providers and hospitals. AFTER THE PROCEDURE  Your blood pressure, temperature, and pulse will be monitored regularly.   This information is not intended to replace advice given to you by your health   care provider. Make sure you discuss any questions you have with your health care provider.   Document Released: 09/25/2007 Document Revised: 12/19/2014 Document Reviewed: 10/08/2014 Elsevier Interactive Patient Education 2016 [employer]  

## 2015-10-31 LAB — PREPARE PLATELET PHERESIS: UNIT DIVISION: 0

## 2015-11-04 ENCOUNTER — Telehealth: Payer: Self-pay | Admitting: *Deleted

## 2015-11-04 ENCOUNTER — Other Ambulatory Visit: Payer: Self-pay | Admitting: *Deleted

## 2015-11-04 ENCOUNTER — Telehealth: Payer: Self-pay | Admitting: Oncology

## 2015-11-04 DIAGNOSIS — C50312 Malignant neoplasm of lower-inner quadrant of left female breast: Secondary | ICD-10-CM

## 2015-11-04 DIAGNOSIS — C50912 Malignant neoplasm of unspecified site of left female breast: Secondary | ICD-10-CM

## 2015-11-04 NOTE — Telephone Encounter (Signed)
Called and left a message for labs on 11/25 and per maggie we will just work her in for plts if we need too after they see what her labs are,she also gave the ok to add red blood on 11/26

## 2015-11-04 NOTE — Telephone Encounter (Signed)
TC from patient who states she is seeing petechiae on her hands and is having some nose bleeds.  She is requesting lab appt on Friday with possible platelet transfusion on Friday as well. Pt is out of town  Today and tomorrow but can come in any time on Friday. Pt is well aware of bleeding precautions.  Val, RN with [name] made aware.

## 2015-11-06 ENCOUNTER — Other Ambulatory Visit: Payer: Self-pay | Admitting: *Deleted

## 2015-11-06 ENCOUNTER — Ambulatory Visit (HOSPITAL_BASED_OUTPATIENT_CLINIC_OR_DEPARTMENT_OTHER): Payer: 59

## 2015-11-06 ENCOUNTER — Other Ambulatory Visit (HOSPITAL_BASED_OUTPATIENT_CLINIC_OR_DEPARTMENT_OTHER): Payer: 59

## 2015-11-06 VITALS — BP 120/69 | HR 94 | Temp 99.0°F | Resp 18

## 2015-11-06 DIAGNOSIS — C50312 Malignant neoplasm of lower-inner quadrant of left female breast: Secondary | ICD-10-CM

## 2015-11-06 DIAGNOSIS — C50919 Malignant neoplasm of unspecified site of unspecified female breast: Secondary | ICD-10-CM

## 2015-11-06 DIAGNOSIS — D649 Anemia, unspecified: Secondary | ICD-10-CM

## 2015-11-06 DIAGNOSIS — C50912 Malignant neoplasm of unspecified site of left female breast: Secondary | ICD-10-CM

## 2015-11-06 DIAGNOSIS — D61810 Antineoplastic chemotherapy induced pancytopenia: Secondary | ICD-10-CM

## 2015-11-06 DIAGNOSIS — T451X5A Adverse effect of antineoplastic and immunosuppressive drugs, initial encounter: Secondary | ICD-10-CM

## 2015-11-06 LAB — CBC WITH DIFFERENTIAL/PLATELET
BASO%: 0 % (ref 0.0–2.0)
BASOS ABS: 0 10*3/uL (ref 0.0–0.1)
EOS%: 1.3 % (ref 0.0–7.0)
Eosinophils Absolute: 0 10*3/uL (ref 0.0–0.5)
HEMATOCRIT: 20.4 % — AB (ref 34.8–46.6)
HEMOGLOBIN: 6.5 g/dL — AB (ref 11.6–15.9)
LYMPH#: 0.6 10*3/uL — AB (ref 0.9–3.3)
LYMPH%: 25.9 % (ref 14.0–49.7)
MCH: 33.7 pg (ref 25.1–34.0)
MCHC: 31.9 g/dL (ref 31.5–36.0)
MCV: 105.7 fL — AB (ref 79.5–101.0)
MONO#: 0.2 10*3/uL (ref 0.1–0.9)
MONO%: 9.2 % (ref 0.0–14.0)
NEUT#: 1.5 10*3/uL (ref 1.5–6.5)
NEUT%: 63.6 % (ref 38.4–76.8)
Platelets: 2 10*3/uL — CL (ref 145–400)
RBC: 1.93 10*6/uL — ABNORMAL LOW (ref 3.70–5.45)
RDW: 27.6 % — ABNORMAL HIGH (ref 11.2–14.5)
WBC: 2.3 10*3/uL — ABNORMAL LOW (ref 3.9–10.3)
nRBC: 0 % (ref 0–0)

## 2015-11-06 LAB — COMPREHENSIVE METABOLIC PANEL (CC13)
ALBUMIN: 2.6 g/dL — AB (ref 3.5–5.0)
ALK PHOS: 153 U/L — AB (ref 40–150)
ALT: 25 U/L (ref 0–55)
ANION GAP: 7 meq/L (ref 3–11)
AST: 32 U/L (ref 5–34)
BILIRUBIN TOTAL: 1.14 mg/dL (ref 0.20–1.20)
BUN: 11.8 mg/dL (ref 7.0–26.0)
CO2: 29 mEq/L (ref 22–29)
Calcium: 8.6 mg/dL (ref 8.4–10.4)
Chloride: 106 mEq/L (ref 98–109)
Creatinine: 0.6 mg/dL (ref 0.6–1.1)
GLUCOSE: 90 mg/dL (ref 70–140)
Potassium: 3.6 mEq/L (ref 3.5–5.1)
Sodium: 142 mEq/L (ref 136–145)
TOTAL PROTEIN: 4.6 g/dL — AB (ref 6.4–8.3)

## 2015-11-06 LAB — PREPARE RBC (CROSSMATCH)

## 2015-11-06 LAB — HOLD TUBE, BLOOD BANK

## 2015-11-06 MED ORDER — ACETAMINOPHEN 325 MG PO TABS
650.0000 mg | ORAL_TABLET | Freq: Once | ORAL | Status: AC
  Administered 2015-11-06: 650 mg via ORAL

## 2015-11-06 MED ORDER — SODIUM CHLORIDE 0.9 % IJ SOLN
10.0000 mL | INTRAMUSCULAR | Status: AC | PRN
  Administered 2015-11-06: 10 mL
  Filled 2015-11-06: qty 10

## 2015-11-06 MED ORDER — ACETAMINOPHEN 325 MG PO TABS
ORAL_TABLET | ORAL | Status: AC
  Filled 2015-11-06: qty 2

## 2015-11-06 MED ORDER — DIPHENHYDRAMINE HCL 25 MG PO CAPS
ORAL_CAPSULE | ORAL | Status: AC
  Filled 2015-11-06: qty 1

## 2015-11-06 MED ORDER — DIPHENHYDRAMINE HCL 25 MG PO CAPS
25.0000 mg | ORAL_CAPSULE | Freq: Once | ORAL | Status: AC
Start: 2015-11-06 — End: 2015-11-06
  Administered 2015-11-06: 25 mg via ORAL

## 2015-11-06 MED ORDER — ACETAMINOPHEN 325 MG PO TABS: 650.0000 mg | ORAL_TABLET | Freq: Once | ORAL | Status: DC

## 2015-11-06 MED ORDER — SODIUM CHLORIDE 0.9 % IV SOLN
250.0000 mL | Freq: Once | INTRAVENOUS | Status: AC
  Administered 2015-11-06: 250 mL via INTRAVENOUS

## 2015-11-06 MED ORDER — HEPARIN SOD (PORK) LOCK FLUSH 100 UNIT/ML IV SOLN
500.0000 [IU] | Freq: Every day | INTRAVENOUS | Status: AC | PRN
  Administered 2015-11-06: 500 [IU]
  Filled 2015-11-06: qty 5

## 2015-11-06 NOTE — Patient Instructions (Signed)
Blood Transfusion  A blood transfusion is a procedure in which you receive donated blood through an IV tube. You may need a blood transfusion because of illness, surgery, or injury. The blood may come from a donor, or it may be your own blood that you donated previously. The blood given in a transfusion is made up of different types of cells. You may receive:  Red blood cells. These carry oxygen and replace lost blood.  Platelets. These control bleeding.  Plasma. Thishelps blood to clot. If you have hemophilia or another clotting disorder, you may also receive other types of blood products. LET YOUR HEALTH CARE PROVIDER KNOW ABOUT:  Any allergies you have.  All medicines you are taking, including vitamins, herbs, eye drops, creams, and over-the-counter medicines.  Previous problems you or members of your family have had with the use of anesthetics.  Any blood disorders you have.  Previous surgeries you have had.  Any medical conditions you may have.  Any previous reactions you have had during a blood transfusion.  RISKS AND COMPLICATIONS Generally, this is a safe procedure. However, problems may occur, including:  Having an allergic reaction to something in the donated blood.  Fever. This may be a reaction to the white blood cells in the transfused blood.  Iron overload. This can happen from having many transfusions.  Transfusion-related acute lung injury (TRALI). This is a rare reaction that causes lung damage. The cause is not known.TRALI can occur within hours of a transfusion or several days later.  Sudden (acute) or delayed hemolytic reactions. This happens if your blood does not match the cells in your transfusion. Your body's defense system (immune system) may try to attack the new cells. This complication is rare.  Infection. This is rare. BEFORE THE PROCEDURE  You may have a blood test to determine your blood type. This is necessary to know what kind of blood your  body will accept.  If you are going to have a planned surgery, you may donate your own blood. This may be done in case you need to have a transfusion.  If you have had an allergic reaction to a transfusion in the past, you may be given medicine to help prevent a reaction. Take this medicine only as directed by your health care provider.  You will have your temperature, blood pressure, and pulse monitored before the transfusion. PROCEDURE   An IV will be started in your hand or arm.  The bag of donated blood will be attached to your IV tube and given into your vein.  Your temperature, blood pressure, and pulse will be monitored regularly during the transfusion. This monitoring is done to detect early signs of a transfusion reaction.  If you have any signs or symptoms of a reaction, your transfusion will be stopped and you may be given medicine.  When the transfusion is over, your IV will be removed.  Pressure may be applied to the IV site for a few minutes.  A bandage (dressing) will be applied. The procedure may vary among health care providers and hospitals. AFTER THE PROCEDURE  Your blood pressure, temperature, and pulse will be monitored regularly.   This information is not intended to replace advice given to you by your health care provider. Make sure you discuss any questions you have with your health care provider.   Document Released: 11/25/2000 Document Revised: 12/19/2014 Document Reviewed: 10/08/2014 Elsevier Interactive Patient Education 2016 [employer]   Platelet Transfusion  A platelet transfusion   is a procedure in which you receive donated platelets through an IV tube. Platelets are tiny pieces of blood cells. When a blood vessel is damaged, platelets collect in the damaged area to help form a blood clot. This begins the healing process. If your platelet count gets too low, your blood may have trouble clotting.  You may need a platelet transfusion if you have a  condition that causes a low number of platelets (thrombocytopenia). A platelet transfusion may be used to stop or prevent bleeding.  LET YOUR HEALTH CARE PROVIDER KNOW ABOUT:   Any allergies you have.   All medicines you are taking, including vitamins, herbs, eye drops, creams, and over-the-counter medicines.   Previous problems you or members of your family have had with the use of anesthetics.   Any blood disorders you have.   Previous surgeries you have had.   Any medical conditions you may have.   Any reactions you have had during a previous transfusion. RISKS AND COMPLICATIONS Generally, this is a safe procedure. However, problems may occur, including:   Fever with or without chills. The fever usually occurs within the first 4 hours of the transfusion and returns to normal within 48 hours.  Allergic reaction. The reaction is most commonly caused by antibodies your body creates against substances in the transfusion. Signs of an allergic reaction may include itching, hives, difficulty breathing, shock, or low blood pressure.  Sudden (acute) or delayed hemolytic reaction. This rare reaction can occur during the transfusion and up to 28 days after the transfusion. The reaction usually occurs when your body's defense system (immune system) attacks the new platelets. Signs of a hemolytic reaction may include fever, headache, difficulty breathing, low blood pressure, a rapid heartbeat, or pain in your back, abdomen, chest, or IV site.  Transfusion-related acute lung injury (TRALI). TRALI can occur within hours of a transfusion, or several days later. This is a rare reaction that causes lung damage. The cause is not known.  Infection. Signs of this rare complication may include fever, chills, vomiting, a rapid heartbeat, or low blood pressure. BEFORE THE PROCEDURE   You may have a blood test to determine your blood type. This is necessary to find out what kind ofplatelets best  matches your platelets.  If you have had an allergic reaction to a transfusion in the past, you may be given medicine to help prevent a reaction. Take this medicine only as directed by your health care provider.  Your temperature, blood pressure, and pulse will be monitored before the transfusion. PROCEDURE  An IV will be started in your hand or arm.  The transfusion will be attached to your IV tubing. The bag of donated platelets will be attached to your IV tube andgiven into your vein.  Your temperature, blood pressure, and pulse will be monitored regularly during the transfusion. This monitoring is done to help detect early signs of a transfusion reaction.  If you have any signs or symptoms of a reaction, your transfusion will be stopped and you may be given medicine.  When your transfusion is complete, your IV will be removed.  Pressure may be applied to the IV site for a few minutes.  A bandage (dressing) will be applied. The procedure may vary among health care providers and hospitals. AFTER THE PROCEDURE  Your blood pressure, temperature, and pulse will be monitored regularly.   This information is not intended to replace advice given to you by your health care provider. Make sure you   discuss any questions you have with your health care provider.   Document Released: 09/25/2007 Document Revised: 12/19/2014 Document Reviewed: 10/08/2014 Elsevier Interactive Patient Education 2016 [employer]   

## 2015-11-07 LAB — TYPE AND SCREEN
ABO/RH(D): A POS
Antibody Screen: NEGATIVE
UNIT DIVISION: 0
Unit division: 0

## 2015-11-07 LAB — PREPARE PLATELET PHERESIS
UNIT DIVISION: 0
Unit division: 0

## 2015-11-10 ENCOUNTER — Ambulatory Visit (HOSPITAL_BASED_OUTPATIENT_CLINIC_OR_DEPARTMENT_OTHER): Payer: 59

## 2015-11-10 ENCOUNTER — Ambulatory Visit: Payer: 59

## 2015-11-10 ENCOUNTER — Encounter: Payer: Self-pay | Admitting: Nurse Practitioner

## 2015-11-10 ENCOUNTER — Ambulatory Visit: Payer: 59 | Admitting: Physician Assistant

## 2015-11-10 ENCOUNTER — Other Ambulatory Visit (HOSPITAL_BASED_OUTPATIENT_CLINIC_OR_DEPARTMENT_OTHER): Payer: 59

## 2015-11-10 ENCOUNTER — Ambulatory Visit (HOSPITAL_BASED_OUTPATIENT_CLINIC_OR_DEPARTMENT_OTHER): Payer: 59 | Admitting: Nurse Practitioner

## 2015-11-10 ENCOUNTER — Telehealth: Payer: Self-pay | Admitting: Oncology

## 2015-11-10 VITALS — BP 126/83 | HR 105 | Temp 98.7°F | Resp 18 | Ht 63.0 in | Wt 178.4 lb

## 2015-11-10 DIAGNOSIS — C50312 Malignant neoplasm of lower-inner quadrant of left female breast: Secondary | ICD-10-CM

## 2015-11-10 DIAGNOSIS — C50919 Malignant neoplasm of unspecified site of unspecified female breast: Secondary | ICD-10-CM

## 2015-11-10 DIAGNOSIS — Z5111 Encounter for antineoplastic chemotherapy: Secondary | ICD-10-CM | POA: Diagnosis not present

## 2015-11-10 DIAGNOSIS — C787 Secondary malignant neoplasm of liver and intrahepatic bile duct: Secondary | ICD-10-CM

## 2015-11-10 DIAGNOSIS — D701 Agranulocytosis secondary to cancer chemotherapy: Secondary | ICD-10-CM

## 2015-11-10 DIAGNOSIS — D649 Anemia, unspecified: Secondary | ICD-10-CM

## 2015-11-10 DIAGNOSIS — D6481 Anemia due to antineoplastic chemotherapy: Secondary | ICD-10-CM | POA: Diagnosis not present

## 2015-11-10 DIAGNOSIS — D696 Thrombocytopenia, unspecified: Secondary | ICD-10-CM | POA: Diagnosis not present

## 2015-11-10 DIAGNOSIS — Z95828 Presence of other vascular implants and grafts: Secondary | ICD-10-CM

## 2015-11-10 DIAGNOSIS — C770 Secondary and unspecified malignant neoplasm of lymph nodes of head, face and neck: Secondary | ICD-10-CM | POA: Diagnosis not present

## 2015-11-10 DIAGNOSIS — D630 Anemia in neoplastic disease: Secondary | ICD-10-CM

## 2015-11-10 DIAGNOSIS — C50912 Malignant neoplasm of unspecified site of left female breast: Secondary | ICD-10-CM

## 2015-11-10 LAB — COMPREHENSIVE METABOLIC PANEL (CC13)
ALT: 21 U/L (ref 0–55)
ANION GAP: 6 meq/L (ref 3–11)
AST: 31 U/L (ref 5–34)
Albumin: 2.7 g/dL — ABNORMAL LOW (ref 3.5–5.0)
Alkaline Phosphatase: 162 U/L — ABNORMAL HIGH (ref 40–150)
BILIRUBIN TOTAL: 1.28 mg/dL — AB (ref 0.20–1.20)
BUN: 7.2 mg/dL (ref 7.0–26.0)
CALCIUM: 8.5 mg/dL (ref 8.4–10.4)
CHLORIDE: 106 meq/L (ref 98–109)
CO2: 29 meq/L (ref 22–29)
CREATININE: 0.7 mg/dL (ref 0.6–1.1)
EGFR: >90 mL/min/{1.73_m2} (ref >90)
Glucose: 109 mg/dl (ref 70–140)
Potassium: 3.6 mEq/L (ref 3.5–5.1)
Sodium: 140 mEq/L (ref 136–145)
TOTAL PROTEIN: 5 g/dL — AB (ref 6.4–8.3)

## 2015-11-10 LAB — CBC WITH DIFFERENTIAL/PLATELET
BASO%: 0.5 % (ref 0.0–2.0)
Basophils Absolute: 0 10*3/uL (ref 0.0–0.1)
EOS%: 0.6 % (ref 0.0–7.0)
Eosinophils Absolute: 0 10*3/uL (ref 0.0–0.5)
HEMATOCRIT: 29.2 % — AB (ref 34.8–46.6)
HGB: 9.9 g/dL — ABNORMAL LOW (ref 11.6–15.9)
LYMPH#: 0.5 10*3/uL — AB (ref 0.9–3.3)
LYMPH%: 14.4 % (ref 14.0–49.7)
MCH: 34.3 pg — ABNORMAL HIGH (ref 25.1–34.0)
MCHC: 33.7 g/dL (ref 31.5–36.0)
MCV: 101.7 fL — ABNORMAL HIGH (ref 79.5–101.0)
MONO#: 0.6 10*3/uL (ref 0.1–0.9)
MONO%: 18.3 % — ABNORMAL HIGH (ref 0.0–14.0)
NEUT%: 66.2 % (ref 38.4–76.8)
NEUTROS ABS: 2.3 10*3/uL (ref 1.5–6.5)
PLATELETS: 38 10*3/uL — AB (ref 145–400)
RBC: 2.87 10*6/uL — ABNORMAL LOW (ref 3.70–5.45)
RDW: 25.8 % — AB (ref 11.2–14.5)
WBC: 3.4 10*3/uL — AB (ref 3.9–10.3)

## 2015-11-10 MED ORDER — DARBEPOETIN ALFA 500 MCG/ML IJ SOSY
500.0000 ug | PREFILLED_SYRINGE | Freq: Once | INTRAMUSCULAR | Status: AC
  Administered 2015-11-10: 500 ug via SUBCUTANEOUS
  Filled 2015-11-10: qty 1

## 2015-11-10 MED ORDER — HEPARIN SOD (PORK) LOCK FLUSH 100 UNIT/ML IV SOLN
500.0000 [IU] | Freq: Once | INTRAVENOUS | Status: AC | PRN
  Administered 2015-11-10: 500 [IU]
  Filled 2015-11-10: qty 5

## 2015-11-10 MED ORDER — SODIUM CHLORIDE 0.9 % IJ SOLN
10.0000 mL | INTRAMUSCULAR | Status: DC | PRN
  Administered 2015-11-10: 10 mL via INTRAVENOUS
  Filled 2015-11-10: qty 10

## 2015-11-10 MED ORDER — SODIUM CHLORIDE 0.9 % IJ SOLN
10.0000 mL | INTRAMUSCULAR | Status: DC | PRN
  Administered 2015-11-10: 10 mL
  Filled 2015-11-10: qty 10

## 2015-11-10 MED ORDER — DEXAMETHASONE SODIUM PHOSPHATE 100 MG/10ML IJ SOLN
Freq: Once | INTRAMUSCULAR | Status: AC
  Administered 2015-11-10: 12:00:00 via INTRAVENOUS
  Filled 2015-11-10: qty 4

## 2015-11-10 MED ORDER — SODIUM CHLORIDE 0.9 % IV SOLN
175.1100 mg | Freq: Once | INTRAVENOUS | Status: AC
  Administered 2015-11-10: 180 mg via INTRAVENOUS
  Filled 2015-11-10: qty 18

## 2015-11-10 MED ORDER — SODIUM CHLORIDE 0.9 % IV SOLN
Freq: Once | INTRAVENOUS | Status: AC
  Administered 2015-11-10: 10:00:00 via INTRAVENOUS

## 2015-11-10 MED ORDER — CARBOPLATIN CHEMO INTRADERMAL TEST DOSE 100MCG/0.02ML
100.0000 ug | Freq: Once | INTRADERMAL | Status: AC
  Administered 2015-11-10: 100 ug via INTRADERMAL
  Filled 2015-11-10: qty 0.01

## 2015-11-10 MED ORDER — SODIUM CHLORIDE 0.9 % IV SOLN
800.0000 mg/m2 | Freq: Once | INTRAVENOUS | Status: AC
  Administered 2015-11-10: 1520 mg via INTRAVENOUS
  Filled 2015-11-10: qty 39.98

## 2015-11-10 MED ORDER — PEGFILGRASTIM 6 MG/0.6ML ~~LOC~~ PSKT
6.0000 mg | PREFILLED_SYRINGE | Freq: Once | SUBCUTANEOUS | Status: AC
  Administered 2015-11-10: 6 mg via SUBCUTANEOUS
  Filled 2015-11-10: qty 0.6

## 2015-11-10 NOTE — Patient Instructions (Signed)

## 2015-11-10 NOTE — Progress Notes (Signed)
ID: Kristin Hoffman OB: [birth year]  MR#: [id number]  MVH#:[id number]  PCP: Drake Leach, MD GYN:  [name] SU: [name] OTHER MD: [name], [name]  CHIEF COMPLAINT:  Stage IV breast cancer  CURRENT TREATMENT: carboplatin/ gemcitabine/ aranesp  BREAST CANCER HISTORY: This patient was previously followed by [name], and was transferred to [name] service as of 08/20/2013.  At the age of 49, the patient had a screening mammogram as a baseline. She had recently given birth and was on birth control pills at that time. The mammogram in November 2006 showed an area of architectural distortion in the left lower inner quadrant. Subsequently an ultrasound was obtained of the left breast showing a 2.0 x 1.5 x 1.4 cm mass, at the 8:00 position, 4 cm from the nipple. A core biopsy performed on 10/21/2005 showed an invasive mammary carcinoma, ER +70%, PR +41%, HER-2/neu negative, with proliferation marker of 38%. [phone])  Breast MRI on 11/08/2005 confirmed a 2.5 cm spiculated mass in the left lower inner quadrant with 3 small satellite nodules adjacent to the primary mass: 3.5 cm posterior medial to the primary mass, measuring 9 mm; 1.5 cm anterior medial to the primary mass measuring 5 mm; and 2 cm medial to the primary mass measuring 5 mm. No axillary adenopathy was noted. No suspicious masses or enhancement are noted in the right breast.  Timiya underwentt left lumpectomy under the care of Dr. 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 2 of 23 lymph nodes were involved.  Margins were clear.  She received adjuvant chemotherapy consisting of 6 q. three-week doses of docetaxel/doxorubicin/cyclophosphamide given between January 2007 and may of 2007, with last dose on 04/20/2006. She underwent radiation therapy completed 07/11/2006, after which she began on tamoxifen in  early August 2007.  She underwent hysterectomy with bilateral salpingo-oophorectomy on 07/15/2008, and was started on letrozole in October of 2009.  A mammogram 11/10/2009 showed microcalcifications in the left breast, and a subsequent biopsy on 11/18/2009 confirmed invasive mammary carcinoma in the left breast. PET and CT of the chest showed no evidence of metastasis in January 2011.  She underwent bilateral mastectomies 01/25/2010, with the right breast showing no evidence of malignancy; in the left breast showing a 1.0 cm grade 2 invasive ductal carcinoma with high-grade DCIS. Tumor was ER +22%, PR negative, HER-2/neu negative, with proliferation marker of 13%. Margins were clear. Patient underwent concurrent left latissimus flap reconstruction and right implant reconstruction.  She received additional chemotherapy with one cycle of carboplatin/gemcitabine given on 03/11/2010. She then received 5 q. three-week cycles of IV CMF between 03/25/2010 and 06/17/2010.  She was started on exemestane in January 2012 and continued until late November 2013 when she was hospitalized for apparent TIA.   Her subsequent history is as detailed below  INTERVAL HISTORY: Kristin Hoffman returns today for follow up of her metastatic breast cancer. Today she is due for her next cycle of carboplatin and gemcitabine, which was recently moved to an every 2 week frequency. Last week she required both 2 units of blood and 2 bags of platelets for critical values in each. She was asymptomatic at the time, besides mild petechiae on the backs of both her hands.  REVIEW OF SYSTEMS: Sheilla denies fevers, chills ,nausea or vomitng. She uses imodium PRN for diarrhea. She does have pain to her left upper quadrant/rib cage when she turns to  wipe after a bowel movement. This pain is intense and can last for few house after being triggered. She continues on tramadol and 2 advil daily for her back pain. She appetite is low, but she stays well  hydrated. She has occasional nose bleeds, but denies hematuria, hematochezia. She has some shortness of breath after exerting herself, but only needs her oxygen to sleep. She has a dry cough, but denies chest pain, or palpitations. Her energy level is low and she naps frequently. A detailed review of systems is otherwise stable.   PAST MEDICaL HISTORY: Past Medical History  Diagnosis Date  . Depression   . Reflux   . Hx-TIA (transient ischemic attack) 11/22/2013  . Chronic headaches 11/22/2013  . Anxiety 11/22/2013  . Breast cancer (Kristin Hoffman)   . Cancer (Kristin Hoffman)   . Breast cancer metastasized to multiple sites [facility]) 12/24/2013    PAST SURGICAL HISTORY: Past Surgical History  Procedure Laterality Date  . Mastectomy w/ nodes partial    . Mastectomy    . Breast reconstruction    . Portacath placement      x 2  . Portacath removal      x 2  . Abdominal hysterectomy    . Tee without cardioversion  11/12/2012    Procedure: TRANSESOPHAGEAL ECHOCARDIOGRAM (TEE);  Surgeon: Candee Furbish, MD;  Location[facility] ENDOSCOPY;  Service: Cardiovascular;  Laterality: N/A;  This TEE may be Dr. Marlou Porch or a LHC Dr    FAMILY HISTORY Both parents are alive and well. Patient has one sister who is 71 years younger and is in good health. No other history of breast or ovarian cancer in the family. Family History  Problem Relation Age of Onset  . Diabetes Mellitus II Neg Hx   . Hypertension Neg Hx     GYNECOLOGIC HISTORY:   (Updated January 2015) G2P2, menarche at age 43 with irregular menses. On birth control pills in the past. On Clomid to induce ovulation with first pregnancy. Also had preeclampsia with first pregnancy. Status post hysterectomy and bilateral salpingo-oophorectomy in August 2009.  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 She is originally from [place]. She's been married to Mission, for 13 years. He works as an  Pharmacist, hospital at [employer].   ADVANCED DIRECTIVES:  Not in place. At the clinic visit 11/10/2015 the patient was given the appropriate forms to complete and notarize at her discretion.    HEALTH MAINTENANCE:  (Updated 11/22/2013) Social History  Substance Use Topics  . Smoking status: Never Smoker   . Smokeless tobacco: Never Used  . Alcohol Use: Yes     Comment: rarely     Colonoscopy:  Never  PAP: S/P LAVH/BSO in August 2009  Bone density: Never  Lipid panel: Not on file   Allergies  Allergen Reactions  . Afinitor [Everolimus] Hives and Itching    Current Outpatient Prescriptions  Medication Sig Dispense Refill  . acidophilus (RISAQUAD) CAPS capsule Take 1 capsule by mouth every morning.     . Biotin 5000 MCG CAPS Take 5,000 mcg by mouth every morning.  30 capsule   . cetirizine (ZYRTEC) 10 MG tablet Take 10 mg by mouth at bedtime.     . Cholecalciferol 2000 UNITS CHEW Chew 1 tablet by mouth daily with breakfast.     . diphenoxylate-atropine (LOMOTIL) 2.5-0.025 MG tablet TAKE 2 TABLETS BY MOUTH 4 TIMES A DAY  AS NEEDED 30 tablet 0  . escitalopram (LEXAPRO) 20 MG tablet Take 1 tablet (20 mg total) by mouth daily. 30 tablet 12  . Ibuprofen (ADVIL) 200 MG CAPS Take 2 capsules (400 mg total) by mouth 3 (three) times daily with meals as needed. 120 each 0  . LORazepam (ATIVAN) 0.5 MG tablet Take 1 tablet (0.5 mg total) by mouth every 6 (six) hours as needed for anxiety. 120 tablet 1  . Melatonin 10 MG TABS Take 1 tablet by mouth at bedtime.    . montelukast (SINGULAIR) 10 MG tablet Take 10 mg by mouth daily with breakfast.     . Multiple Vitamin (MULTIVITAMIN) tablet Take 1 tablet by mouth every morning.     . nystatin (MYCOSTATIN) 100000 UNIT/ML suspension Take 5 mLs (500,000 Units total) by mouth 4 (four) times daily. 240 mL 1  . omeprazole (PRILOSEC) 40 MG capsule Take 40 mg by mouth every morning.     . ondansetron (ZOFRAN) 8 MG tablet TAKE 1 TABLET BY MOUTH 2  TIMES DAILY. START DAY AFTER CHEMO FOR 2 DAYS. THEN TAKE AS NEEDED NAUSEA 30 tablet 1  . prochlorperazine (COMPAZINE) 10 MG tablet Take 10 mg by mouth every 6 (six) hours as needed for nausea or vomiting.     . traMADol (ULTRAM) 50 MG tablet Take 1 tablet (50 mg total) by mouth every 6 (six) hours as needed. 120 tablet 1   No current facility-administered medications for this visit.   Facility-Administered Medications Ordered in Other Visits  Medication Dose Route Frequency Provider Last Rate Last Dose  . sodium chloride 0.9 % injection 10 mL  10 mL Intracatheter PRN Chauncey Cruel, MD   10 mL at 09/15/15 1524  . sodium chloride 0.9 % injection 10 mL  10 mL Intracatheter PRN Chauncey Cruel, MD   10 mL at 11/10/15 1405    OBJECTIVE: Young white woman in no acute distress Filed Vitals:   11/10/15 0945  BP: 126/83  Pulse: 105  Temp: 98.7 F (37.1 C)  Resp: 18  Body mass index is 31.61 kg/(m^2).  ECOG: 2 Filed Weights   11/10/15 0945  Weight: 178 lb 6.4 oz (80.922 kg)   Skin: warm, dry  HEENT: sclerae anicteric, conjunctivae pink, oropharynx clear. No thrush or mucositis.  Lymph Nodes: No cervical or supraclavicular lymphadenopathy  Lungs: clear to auscultation bilaterally, no rales, wheezes, or rhonci  Heart: regular rate and rhythm  Abdomen: round, soft, non tender, positive bowel sounds  Musculoskeletal: No focal spinal tenderness, no peripheral edema  Neuro: non focal, well oriented, positive affect  Breasts: deferred  LAB RESULTS:  CBC Latest Ref Rng 11/10/2015 11/06/2015 10/30/2015  WBC 3.9 - 10.3 10e3/uL 3.4(L) 2.3(L) 11.7(H)  Hemoglobin 11.6 - 15.9 g/dL 9.9(L) 6.5(LL) 9.5(L)  Hematocrit 34.8 - 46.6 % 29.2(L) 20.4(L) 29.5(L)  Platelets 145 - 400 10e3/uL 38(L) 2(LL) 37(L)   CMP Latest Ref Rng 11/10/2015 11/06/2015 10/27/2015  Glucose 70 - 140 mg/dl 109 90 102  BUN 7.0 - 26.0 mg/dL 7.2 11.8 6.5(L)  Creatinine 0.6 - 1.1 mg/dL 0.7 0.6 0.7  Sodium 136 - 145 mEq/L  140 142 142  Potassium 3.5 - 5.1 mEq/L 3.6 3.6 3.5  Chloride 101 - 111 mmol/L - - -  CO2 22 - 29 mEq/L 29 29 28   Calcium 8.4 - 10.4 mg/dL 8.5 8.6 8.3(L)  Total Protein 6.4 - 8.3 g/dL 5.0(L) 4.6(L) 4.8(L)  Total Bilirubin 0.20 - 1.20 mg/dL 1.28(H) 1.14 1.17  Alkaline Phos  40 - 150 U/L 162(H) 153(H) 156(H)  AST 5 - 34 U/L 31 32 40(H)  ALT 0 - 55 U/L 21 25 27     STUDIES: No results found.  ASSESSMENT: 49 y.o. [place] woman  (1)  status post left lumpectomy under the care of Dr. 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 2 of 23 lymph nodes were involved.  Margins were clear.  (2) status post adjuvant chemotherapy consisting of 6 q. three-week doses of docetaxel/ doxorubicin/ cyclophosphamide given between January 2007 and may of 2007, with last dose on 04/20/2006.  (3) status post radiation therapy to the left breast, completed 07/11/2006  (4) began on tamoxifen in early August 2007 and continued until October 2009. She status post hysterectomy with bilateral salpingo-oophorectomy on 07/15/2008, and was started on letrozole in October of 2009.  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 PET and CT of the chest showed no evidence of metastasis in January 2011.  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 Margins were clear. Patient underwent concurrent left latissimus flap reconstruction and right implant reconstruction.  (7)  status post additional chemotherapy with one cycle of carboplatin/gemcitabine given on 03/11/2010. She then received 5 q. three-week  cycles of IV CMF between 03/25/2010 and 06/17/2010.  (8) started on exemestane in January 2012 and continued until late November 2013 when she was hospitalized for apparent TIA at which time her exemestane was stopped and not resumed  (9) METASTATIC DISEASE: evidence of disease recurence noted on chest CT, liver MRI and PET scan in December 2014, with suspicious lung nodules, lymphadenopathy, and 3 lesions in the right lobe of the liver, but no evidence of bony disease and no evidence of brain involvement by brain MRI September 2014. Biopsy of a left supraclavicular lymph node on 12/11/2013 confirmed metastatic carcinoma, estrogen receptor 45% positive, progesterone receptor 17% positive, with no HER-2 amplification.  (10) fulvestrant started 12/03/2013, discontinued 03/25/2014 with progression  (11) exemestane/ everolimus started 04/04/2014; everolimus held 04/14/2014, with skin rash and mouth soreness; resumed at 5 mg a day as of 04/29/2014, discontinued 05/09/2014 with similar symptoms  (12) continuing exemestane, adding palbociclib 05/26/2014;   (a) palbociclib dose dropped to 75 mg every other day for 21 days beginning with cycle 4  (b) exemestane and Palbociclib discontinued December 2015 with evidence of progression  (13) [facility] referral placed 04/28/2049 -- re-sent 05/23/2014-- shows Pi3KCA and TP53 mutations  (a) [facility] one test requested 12/10/2014-- confirms [employer] results  (b) did not qualify for capecitabine/ BYL 719 trial because of elevated lipase  (14) started eribulin 12/26/2014, 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   (a) dpse dropped from 2g BID to 1.5g BID starting on 04/26/15  (b) stopped 05/17/2015 (after 4 cycles) with progression  (16) started carboplatin/ gemcitabine 05/26/2015, to be  given day one and day 8 of each 21 day cycle  (a) given her history of severe neutropenia with this chemotherapy we'll do Neupogen days 2, 3  and 4 of each cycle and onpro day 9  (b) carbo dose dropped by 25% w cycle 2 due to very low platelet nadir  (c) CT scans after 3 cycles (07/27/2015) show measurable response  (d) CT scans after 6 cycles (09/28/2015)show continuing response  (17) Left pleural effusion\   (a) s/p L thoracentesis 05/22/2015, 05/29/2015 and 06/05/2015  (b) attempt at pleurx 06/12/2015 cancelled as effusion loculated and scant  (18) symptomatic anemia  (a) Aranesp started 07/28/2015, retic <1% on 10/06/2015  (b) Feraheme given 08/04/2015; ferritin >2K on 10/06/2015, last dose on 11/1  PLAN:  The labs were reviewed in detail, and while her hgb improved tremendously to 9.9, her platelets are only 38. The patient insisted on being treated today. I consulted with [name] and he was agreeable, so long as she returned for platelets later this week. This will be her last cycle prior to restaging studies on 12/12. She has been urged to call immediately or proceed with the ED for excessive bruising, bleeding, blood in stools or urine.   She will continue aranesp 563mg every 2 week, next due today.  CPammiewill return next Tuesday for labs and a follow up visit, and more blood products if necessary. She understands and agrees with this plan. She has been encouraged to call with any issues that might arise before her next visit here.    HLaurie Panda NP   11/10/2015 4:37 PM

## 2015-11-10 NOTE — Patient Instructions (Addendum)
[facility] Discharge Instructions for Patients Receiving Chemotherapy  Today you received the following chemotherapy agents Gemzar/Carboplatin/aranesp/onpro  To help prevent nausea and vomiting after your treatment, we encourage you to take your nausea medication as prescribed.   If you develop nausea and vomiting that is not controlled by your nausea medication, call the clinic.   BELOW ARE SYMPTOMS THAT SHOULD BE REPORTED IMMEDIATELY:  *FEVER GREATER THAN 100.5 F  *CHILLS WITH OR WITHOUT FEVER  NAUSEA AND VOMITING THAT IS NOT CONTROLLED WITH YOUR NAUSEA MEDICATION  *UNUSUAL SHORTNESS OF BREATH  *UNUSUAL BRUISING OR BLEEDING  TENDERNESS IN MOUTH AND THROAT WITH OR WITHOUT PRESENCE OF ULCERS  *URINARY PROBLEMS  *BOWEL PROBLEMS  UNUSUAL RASH Items with * indicate a potential emergency and should be followed up as soon as possible.  Feel free to call the clinic you have any questions or concerns. The clinic phone number is [phone].  Please show the [place] at check-in to the Emergency Department and triage nurse.

## 2015-11-10 NOTE — Telephone Encounter (Signed)
Appointments complete per 11/29 pof and patient to get avs report and appointment in inf area. Per 11/29 pof patient to have lab/blood 12/2 - inf is at capacity 12/2 and per pt she would prefer doing lab Friday 12/2 and blood Saturday 12/3 vs being scheduled at the [facility]. Lab/blood scheduled as per patient request. Left message informing Heather's desk nurse.

## 2015-11-10 NOTE — Progress Notes (Signed)
Labs reviewed with Heather,NP ok to treat per MD pt will return this week for platelets

## 2015-11-11 ENCOUNTER — Ambulatory Visit: Payer: 59

## 2015-11-12 ENCOUNTER — Ambulatory Visit (HOSPITAL_COMMUNITY)
Admission: RE | Admit: 2015-11-12 | Discharge: 2015-11-12 | Disposition: A | Discharge status: Home / Self Care | Payer: 59 | Source: Ambulatory Visit | Attending: Oncology | Admitting: Oncology

## 2015-11-12 ENCOUNTER — Ambulatory Visit: Payer: 59

## 2015-11-12 DIAGNOSIS — C50312 Malignant neoplasm of lower-inner quadrant of left female breast: Secondary | ICD-10-CM

## 2015-11-12 DIAGNOSIS — C50912 Malignant neoplasm of unspecified site of left female breast: Secondary | ICD-10-CM

## 2015-11-12 DIAGNOSIS — D638 Anemia in other chronic diseases classified elsewhere: Secondary | ICD-10-CM | POA: Insufficient documentation

## 2015-11-12 DIAGNOSIS — Z8673 Personal history of transient ischemic attack (TIA), and cerebral infarction without residual deficits: Secondary | ICD-10-CM

## 2015-11-12 DIAGNOSIS — D630 Anemia in neoplastic disease: Secondary | ICD-10-CM

## 2015-11-12 DIAGNOSIS — R530 Neoplastic (malignant) related fatigue: Secondary | ICD-10-CM

## 2015-11-13 ENCOUNTER — Other Ambulatory Visit (HOSPITAL_BASED_OUTPATIENT_CLINIC_OR_DEPARTMENT_OTHER): Payer: 59

## 2015-11-13 ENCOUNTER — Ambulatory Visit: Payer: 59

## 2015-11-13 DIAGNOSIS — C50919 Malignant neoplasm of unspecified site of unspecified female breast: Secondary | ICD-10-CM

## 2015-11-13 DIAGNOSIS — C50312 Malignant neoplasm of lower-inner quadrant of left female breast: Secondary | ICD-10-CM

## 2015-11-13 LAB — COMPREHENSIVE METABOLIC PANEL
ALBUMIN: 2.8 g/dL — AB (ref 3.5–5.0)
ALT: 23 U/L (ref 0–55)
ANION GAP: 8 meq/L (ref 3–11)
AST: 32 U/L (ref 5–34)
Alkaline Phosphatase: 161 U/L — ABNORMAL HIGH (ref 40–150)
BILIRUBIN TOTAL: 1.49 mg/dL — AB (ref 0.20–1.20)
BUN: 14.7 mg/dL (ref 7.0–26.0)
CALCIUM: 8.6 mg/dL (ref 8.4–10.4)
CO2: 29 meq/L (ref 22–29)
CREATININE: 0.7 mg/dL (ref 0.6–1.1)
Chloride: 104 mEq/L (ref 98–109)
EGFR: >90 mL/min/{1.73_m2} (ref >90)
Glucose: 98 mg/dl (ref 70–140)
Potassium: 3.7 mEq/L (ref 3.5–5.1)
Sodium: 141 mEq/L (ref 136–145)
TOTAL PROTEIN: 5 g/dL — AB (ref 6.4–8.3)

## 2015-11-13 LAB — CBC WITH DIFFERENTIAL/PLATELET
BASO%: 0.4 % (ref 0.0–2.0)
BASOS ABS: 0 10*3/uL (ref 0.0–0.1)
EOS ABS: 0 10*3/uL (ref 0.0–0.5)
EOS%: 0.4 % (ref 0.0–7.0)
HEMATOCRIT: 28.1 % — AB (ref 34.8–46.6)
HEMOGLOBIN: 9 g/dL — AB (ref 11.6–15.9)
LYMPH%: 3 % — ABNORMAL LOW (ref 14.0–49.7)
MCH: 33.7 pg (ref 25.1–34.0)
MCHC: 32.1 g/dL (ref 31.5–36.0)
MCV: 105.1 fL — AB (ref 79.5–101.0)
MONO#: 0.2 10*3/uL (ref 0.1–0.9)
MONO%: 1.3 % (ref 0.0–14.0)
NEUT%: 94.9 % — ABNORMAL HIGH (ref 38.4–76.8)
NEUTROS ABS: 11.6 10*3/uL — AB (ref 1.5–6.5)
PLATELETS: 29 10*3/uL — AB (ref 145–400)
RBC: 2.68 10*6/uL — ABNORMAL LOW (ref 3.70–5.45)
RDW: 26.9 % — AB (ref 11.2–14.5)
WBC: 12.3 10*3/uL — AB (ref 3.9–10.3)
lymph#: 0.4 10*3/uL — ABNORMAL LOW (ref 0.9–3.3)

## 2015-11-13 LAB — HOLD TUBE, BLOOD BANK

## 2015-11-15 ENCOUNTER — Other Ambulatory Visit: Payer: Self-pay | Admitting: Oncology

## 2015-11-17 ENCOUNTER — Other Ambulatory Visit (HOSPITAL_BASED_OUTPATIENT_CLINIC_OR_DEPARTMENT_OTHER): Payer: 59

## 2015-11-17 ENCOUNTER — Other Ambulatory Visit: Payer: Self-pay

## 2015-11-17 ENCOUNTER — Ambulatory Visit: Payer: 59

## 2015-11-17 ENCOUNTER — Ambulatory Visit (HOSPITAL_BASED_OUTPATIENT_CLINIC_OR_DEPARTMENT_OTHER): Payer: 59 | Admitting: Nurse Practitioner

## 2015-11-17 ENCOUNTER — Ambulatory Visit (HOSPITAL_BASED_OUTPATIENT_CLINIC_OR_DEPARTMENT_OTHER): Payer: 59

## 2015-11-17 ENCOUNTER — Other Ambulatory Visit: Payer: Self-pay | Admitting: *Deleted

## 2015-11-17 VITALS — BP 122/64 | HR 95 | Temp 98.4°F | Resp 18 | Ht 63.0 in | Wt 178.5 lb

## 2015-11-17 VITALS — BP 110/72 | HR 85 | Temp 98.2°F | Resp 18

## 2015-11-17 DIAGNOSIS — C7951 Secondary malignant neoplasm of bone: Secondary | ICD-10-CM | POA: Diagnosis not present

## 2015-11-17 DIAGNOSIS — D630 Anemia in neoplastic disease: Secondary | ICD-10-CM | POA: Diagnosis not present

## 2015-11-17 DIAGNOSIS — C50312 Malignant neoplasm of lower-inner quadrant of left female breast: Secondary | ICD-10-CM

## 2015-11-17 DIAGNOSIS — R530 Neoplastic (malignant) related fatigue: Secondary | ICD-10-CM | POA: Diagnosis not present

## 2015-11-17 DIAGNOSIS — D638 Anemia in other chronic diseases classified elsewhere: Secondary | ICD-10-CM | POA: Diagnosis not present

## 2015-11-17 DIAGNOSIS — D649 Anemia, unspecified: Secondary | ICD-10-CM

## 2015-11-17 DIAGNOSIS — C50919 Malignant neoplasm of unspecified site of unspecified female breast: Secondary | ICD-10-CM

## 2015-11-17 DIAGNOSIS — Z95828 Presence of other vascular implants and grafts: Secondary | ICD-10-CM

## 2015-11-17 DIAGNOSIS — C50912 Malignant neoplasm of unspecified site of left female breast: Secondary | ICD-10-CM

## 2015-11-17 DIAGNOSIS — C7931 Secondary malignant neoplasm of brain: Secondary | ICD-10-CM

## 2015-11-17 LAB — CBC WITH DIFFERENTIAL/PLATELET
BASO%: 0.4 % (ref 0.0–2.0)
BASOS ABS: 0 10*3/uL (ref 0.0–0.1)
EOS%: 0.1 % (ref 0.0–7.0)
Eosinophils Absolute: 0 10*3/uL (ref 0.0–0.5)
HEMATOCRIT: 22.8 % — AB (ref 34.8–46.6)
HEMOGLOBIN: 7.7 g/dL — AB (ref 11.6–15.9)
LYMPH#: 0.6 10*3/uL — AB (ref 0.9–3.3)
LYMPH%: 12.4 % — ABNORMAL LOW (ref 14.0–49.7)
MCH: 35.1 pg — ABNORMAL HIGH (ref 25.1–34.0)
MCHC: 33.7 g/dL (ref 31.5–36.0)
MCV: 104.3 fL — ABNORMAL HIGH (ref 79.5–101.0)
MONO#: 0.2 10*3/uL (ref 0.1–0.9)
MONO%: 4.6 % (ref 0.0–14.0)
NEUT#: 3.7 10*3/uL (ref 1.5–6.5)
NEUT%: 82.5 % — AB (ref 38.4–76.8)
PLATELETS: 6 10*3/uL — AB (ref 145–400)
RBC: 2.19 10*6/uL — ABNORMAL LOW (ref 3.70–5.45)
RDW: 27.3 % — AB (ref 11.2–14.5)
WBC: 4.5 10*3/uL (ref 3.9–10.3)

## 2015-11-17 LAB — COMPREHENSIVE METABOLIC PANEL WITH GFR
ALT: 19 U/L (ref 0–55)
AST: 28 U/L (ref 5–34)
Albumin: 2.7 g/dL — ABNORMAL LOW (ref 3.5–5.0)
Alkaline Phosphatase: 168 U/L — ABNORMAL HIGH (ref 40–150)
Anion Gap: 5 meq/L (ref 3–11)
BUN: 14.4 mg/dL (ref 7.0–26.0)
CO2: 28 meq/L (ref 22–29)
Calcium: 8.4 mg/dL (ref 8.4–10.4)
Chloride: 107 meq/L (ref 98–109)
Creatinine: 0.6 mg/dL (ref 0.6–1.1)
EGFR: >90 ml/min/1.73 m2
Glucose: 94 mg/dL (ref 70–140)
Potassium: 4 meq/L (ref 3.5–5.1)
Sodium: 140 meq/L (ref 136–145)
Total Bilirubin: 1.14 mg/dL (ref 0.20–1.20)
Total Protein: 4.8 g/dL — ABNORMAL LOW (ref 6.4–8.3)

## 2015-11-17 LAB — PREPARE RBC (CROSSMATCH)

## 2015-11-17 LAB — HOLD TUBE, BLOOD BANK

## 2015-11-17 MED ORDER — SODIUM CHLORIDE 0.9 % IV SOLN
250.0000 mL | Freq: Once | INTRAVENOUS | Status: AC
  Administered 2015-11-17: 250 mL via INTRAVENOUS

## 2015-11-17 MED ORDER — DIPHENHYDRAMINE HCL 25 MG PO CAPS
ORAL_CAPSULE | ORAL | Status: AC
  Filled 2015-11-17: qty 1

## 2015-11-17 MED ORDER — HEPARIN SOD (PORK) LOCK FLUSH 100 UNIT/ML IV SOLN
500.0000 [IU] | Freq: Every day | INTRAVENOUS | Status: DC | PRN
  Filled 2015-11-17: qty 5

## 2015-11-17 MED ORDER — ACETAMINOPHEN 325 MG PO TABS
ORAL_TABLET | ORAL | Status: AC
  Filled 2015-11-17: qty 2

## 2015-11-17 MED ORDER — ACETAMINOPHEN 325 MG PO TABS
650.0000 mg | ORAL_TABLET | Freq: Once | ORAL | Status: AC
  Administered 2015-11-17: 650 mg via ORAL

## 2015-11-17 MED ORDER — SODIUM CHLORIDE 0.9 % IJ SOLN
10.0000 mL | INTRAMUSCULAR | Status: DC | PRN
  Administered 2015-11-17: 10 mL via INTRAVENOUS
  Filled 2015-11-17: qty 10

## 2015-11-17 MED ORDER — DIPHENHYDRAMINE HCL 25 MG PO CAPS
25.0000 mg | ORAL_CAPSULE | Freq: Once | ORAL | Status: AC
  Administered 2015-11-17: 25 mg via ORAL

## 2015-11-17 MED ORDER — SODIUM CHLORIDE 0.9 % IJ SOLN
10.0000 mL | INTRAMUSCULAR | Status: DC | PRN
  Filled 2015-11-17: qty 10

## 2015-11-17 NOTE — Progress Notes (Signed)
ID: Kristin Hoffman OB: 03-09-1966  MR#: [id number]  SEG#:[id number]  PCP: Drake Leach, MD GYN:  Kristin Hoffman SU: Kristin Hoffman OTHER MD: Kristin Hoffman, Kristin Hoffman  CHIEF COMPLAINT:  Stage IV breast cancer  CURRENT TREATMENT: Liposomal doxorubicin  BREAST CANCER HISTORY: This patient was previously followed by Kristin Hoffman, and was transferred to Kristin Hoffman service as of 08/20/2013.  At the age of 38, the patient had a screening mammogram as a baseline. She had recently given birth and was on birth control pills at that time. The mammogram in November 2006 showed an area of architectural distortion in the left lower inner quadrant. Subsequently an ultrasound was obtained of the left breast showing a 2.0 x 1.5 x 1.4 cm mass, at the 8:00 position, 4 cm from the nipple. A core biopsy performed on 10/21/2005 showed an invasive mammary carcinoma, ER +70%, PR +41%, HER-2/neu negative, with proliferation marker of 38%. [id number]  Breast MRI on 11/08/2005 confirmed a 2.5 cm spiculated mass in the left lower inner quadrant with 3 small satellite nodules adjacent to the primary mass: 3.5 cm posterior medial to the primary mass, measuring 9 mm; 1.5 cm anterior medial to the primary mass measuring 5 mm; and 2 cm medial to the primary mass measuring 5 mm. No axillary adenopathy was noted. No suspicious masses or enhancement are noted in the right breast.  Kristin Hoffman underwentt left lumpectomy under the care of Kristin Hoffman on 11/18/2005 for a 2.5 cm grade 3 invasive ductal carcinoma, ER +70%, PR +41%, HER-2/neu negative, with proliferation marker of 38%. 2 of 23 lymph nodes were involved.  Margins were clear.  She received adjuvant chemotherapy consisting of 6 q. three-week doses of docetaxel/doxorubicin/cyclophosphamide given between January 2007 and may of 2007, with last dose on 04/20/2006. She underwent radiation therapy completed 07/11/2006, after which she began on tamoxifen in early August  2007.  She underwent hysterectomy with bilateral salpingo-oophorectomy on 07/15/2008, and was started on letrozole in October of 2009.  A mammogram 11/10/2009 showed microcalcifications in the left breast, and a subsequent biopsy on 11/18/2009 confirmed invasive mammary carcinoma in the left breast. PET and CT of the chest showed no evidence of metastasis in January 2011.  She underwent bilateral mastectomies 01/25/2010, with the right breast showing no evidence of malignancy; in the left breast showing a 1.0 cm grade 2 invasive ductal carcinoma with high-grade DCIS. Tumor was ER +22%, PR negative, HER-2/neu negative, with proliferation marker of 13%. Margins were clear. Patient underwent concurrent left latissimus flap reconstruction and right implant reconstruction.  She received additional chemotherapy with one cycle of carboplatin/gemcitabine given on 03/11/2010. She then received 5 q. three-week cycles of IV CMF between 03/25/2010 and 06/17/2010.  She was started on exemestane in January 2012 and continued until late November 2013 when she was hospitalized for apparent TIA.   Her subsequent history Hoffman as detailed below  INTERVAL HISTORY: Kristin Hoffman returns today for follow up of her triple negative breast cancer. Today Hoffman day 15 cycle 8 of carboplatin and gemcitabine. She tolerated the most recent treatment "okay". She feels better overall, Hoffman more active, and was able to go on a couple of overnight trips with family over the holidays.  REVIEW OF SYSTEMS: Kristin Hoffman Hoffman still very tired and sleeps a good part of the day. She has very little appetite although her sense of taste Hoffman okay. She uses oxygen primarily at night. She has pain in the front rib area on the right side and  also in her lower back. Sometimes her vision Hoffman a little blurred but this Hoffman not more persistent than before. She does have some seasonal allergies including mild epistaxis, ringing in her years, and hoarseness. She denies a cough. She  Hoffman short of breath with moderate activity, but not at rest. She bruises easily and currently has some petechiae she wanted to show me. She feels weak and forgetful but not anxious or depressed. A detailed review of systems today was otherwise stable  PAST MEDICaL HISTORY: Past Medical History  Diagnosis Date  . Depression   . Reflux   . Hx-TIA (transient ischemic attack) 11/22/2013  . Chronic headaches 11/22/2013  . Anxiety 11/22/2013  . Breast cancer ([place])   . Cancer ([place])   . Breast cancer metastasized to multiple sites [facility]) 12/24/2013    PAST SURGICAL HISTORY: Past Surgical History  Procedure Laterality Date  . Mastectomy w/ nodes partial    . Mastectomy    . Breast reconstruction    . Portacath placement      x 2  . Portacath removal      x 2  . Abdominal hysterectomy    . Tee without cardioversion  11/12/2012    Procedure: TRANSESOPHAGEAL ECHOCARDIOGRAM (TEE);  Surgeon: Kristin Furbish, MD;  Location: [facility] ENDOSCOPY;  Service: Cardiovascular;  Laterality: N/A;  This TEE may be Kristin Hoffman or a Kristin Hoffman Dr    FAMILY HISTORY Both parents are alive and well. Patient has one sister who Hoffman 41 years younger and Hoffman in good health. No other history of breast or ovarian cancer in the family. Family History  Problem Relation Age of Onset  . Diabetes Mellitus II Neg Hx   . Hypertension Neg Hx     GYNECOLOGIC HISTORY:   (Updated January 2015) G2P2, menarche at age 27 with irregular menses. On birth control pills in the past. On Clomid to induce ovulation with first pregnancy. Also had preeclampsia with first pregnancy. Status post hysterectomy and bilateral salpingo-oophorectomy in August 2009.  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 She Hoffman originally from [place]. She's been married to Caney, for 13 years. He works as an Pharmacist, hospital at [employer].   ADVANCED DIRECTIVES:  Not in place. At  the clinic visit 11/17/2015 the patient was given the appropriate forms to complete and notarize at her discretion.    HEALTH MAINTENANCE:  (Updated 11/22/2013) Social History  Substance Use Topics  . Smoking status: Never Smoker   . Smokeless tobacco: Never Used  . Alcohol Use: Yes     Comment: rarely     Colonoscopy:  Never  PAP: S/P LAVH/BSO in August 2009  Bone density: Never  Lipid panel: Not on file   Allergies  Allergen Reactions  . Afinitor [Everolimus] Hives and Itching    Current Outpatient Prescriptions  Medication Sig Dispense Refill  . acidophilus (RISAQUAD) CAPS capsule Take 1 capsule by mouth every morning.     . Biotin 5000 MCG CAPS Take 5,000 mcg by mouth every morning.  30 capsule   . cetirizine (ZYRTEC) 10 MG tablet Take 10 mg by mouth at bedtime.     . Cholecalciferol 2000 UNITS CHEW Chew 1 tablet by mouth daily with breakfast.     . diphenoxylate-atropine (LOMOTIL) 2.5-0.025 MG tablet TAKE TWO TABLETS 4 TIMES A DAY AS NEEDED 30 tablet 0  . escitalopram (LEXAPRO) 20 MG tablet  Take 1 tablet (20 mg total) by mouth daily. 30 tablet 12  . Ibuprofen (ADVIL) 200 MG CAPS Take 2 capsules (400 mg total) by mouth 3 (three) times daily with meals as needed. 120 each 0  . LORazepam (ATIVAN) 0.5 MG tablet Take 1 tablet (0.5 mg total) by mouth every 6 (six) hours as needed for anxiety. 120 tablet 1  . Melatonin 10 MG TABS Take 1 tablet by mouth at bedtime.    . montelukast (SINGULAIR) 10 MG tablet Take 10 mg by mouth daily with breakfast.     . Multiple Vitamin (MULTIVITAMIN) tablet Take 1 tablet by mouth every morning.     . nystatin (MYCOSTATIN) 100000 UNIT/ML suspension Take 5 mLs (500,000 Units total) by mouth 4 (four) times daily. 240 mL 1  . omeprazole (PRILOSEC) 40 MG capsule Take 40 mg by mouth every morning.     . ondansetron (ZOFRAN) 8 MG tablet TAKE 1 TABLET BY MOUTH 2 TIMES DAILY. START DAY AFTER CHEMO FOR 2 DAYS. THEN TAKE AS NEEDED NAUSEA 30 tablet 1  .  prochlorperazine (COMPAZINE) 10 MG tablet Take 10 mg by mouth every 6 (six) hours as needed for nausea or vomiting.     . traMADol (ULTRAM) 50 MG tablet Take 1 tablet (50 mg total) by mouth every 6 (six) hours as needed. 120 tablet 1   No current facility-administered medications for this visit.   Facility-Administered Medications Ordered in Other Visits  Medication Dose Route Frequency Provider Last Rate Last Dose  . sodium chloride 0.9 % injection 10 mL  10 mL Intracatheter PRN Chauncey Cruel, MD   10 mL at 09/15/15 1524    OBJECTIVE: Young white woman who appears stated age 73 Vitals:   11/17/15 0945  BP: 122/64  Pulse: 95  Temp: 98.4 F (36.9 C)  Resp: 18  Body mass index Hoffman 31.63 kg/(m^2).  ECOG: 2 Filed Weights   11/17/15 0945  Weight: 178 lb 8 oz (80.967 kg)   Sclerae unicteric, pupils round and equal Oropharynx clear and moist-- no thrush or other lesions No cervical or supraclavicular adenopathy Lungs no rales or rhonchi Heart regular rate and rhythm Abd soft, nontender, positive bowel sounds MSK no focal spinal tenderness, no upper extremity lymphedema Neuro: nonfocal, well oriented, appropriate affect Breasts: Deferred  LAB RESULTS:  CBC Latest Ref Rng 11/17/2015 11/13/2015 11/10/2015  WBC 3.9 - 10.3 10e3/uL 4.5 12.3(H) 3.4(L)  Hemoglobin 11.6 - 15.9 g/dL 7.7(L) 9.0(L) 9.9(L)  Hematocrit 34.8 - 46.6 % 22.8(L) 28.1(L) 29.2(L)  Platelets 145 - 400 10e3/uL 6(LL) 29(L) 38(L)   CMP Latest Ref Rng 11/17/2015 11/13/2015 11/10/2015  Glucose 70 - 140 mg/dl 94 98 109  BUN 7.0 - 26.0 mg/dL 14.4 14.7 7.2  Creatinine 0.6 - 1.1 mg/dL 0.6 0.7 0.7  Sodium 136 - 145 mEq/L 140 141 140  Potassium 3.5 - 5.1 mEq/L 4.0 3.7 3.6  Chloride 101 - 111 mmol/L - - -  CO2 22 - 29 mEq/L 28 29 29   Calcium 8.4 - 10.4 mg/dL 8.4 8.6 8.5  Total Protein 6.4 - 8.3 g/dL 4.8(L) 5.0(L) 5.0(L)  Total Bilirubin 0.20 - 1.20 mg/dL 1.14 1.49(H) 1.28(H)  Alkaline Phos 40 - 150 U/L 168(H) 161(H)  162(H)  AST 5 - 34 U/L 28 32 31  ALT 0 - 55 U/L 19 23 21     STUDIES: Final Echocardiography Report-Transthoracic Study Date: 03/23/2015  Patient Information  Patient: CAOIMHE, DAMRON JKD:[id number]- Address: [place], [address] Zip: [place]  [address] Telephone: [phone] Date of Birth: [birth year] Sex: Female Sonographer: Alisa Graff Laboratory MD: Referring MD: Verlon Setting Interpreting MD: Marca Ancona Baseline Data  Age: 48 Height: 160 cm Heart Rate: 87 Weight: 87. kg Rhythm: NSR BSA: 1.9 BP Resting: 114/77 Inpatient: Outpatient Scheduling Type: Routine  Study Data  Case Number: [id number] RoomNumber: OP Date of Test: 4/11/2016Study End Time: 01:15 PM Indication(s):  Pre-chemotherapy  CPT Code Description 93306 Resting transthoracic echocardiography with continous and pulsed wave doppler and color flow imaging complete  Quality of Study: Adequate   Measurements Normal Values Normal Values  LV 5. 3.5 - 5.6 Fractional 25 28 - 42% Diastole 4 cm Shortening LV Systole 4 2.2 - 4.3 Aortic Root 3. 2.0 - 3.7 cm 4 cm LV Post. 1 0.7 - 1.2 Left Atrium 3. 1.9 - 4.0 Wall cm 3 cm Diastole LV IVS 1 0.7 - 1.2 Right 1.0 - 2.6 Diastole cm Ventricle cm Thickness LV IVS 0.7 - 1.2 Ejection 55% 53% - 80% Systole cm Fraction Thickness (Rest) LV Mass    Interpretation:  Clinical Diagnoses and Echocardiographic Findings Normal left ventricular contractile function (estimated ejection fraction = 55%)  Descriptive Comments  Left Ventricle There Hoffman normal left ventricular chamber size, wall thickness and contraction. The derived left ventricular ejection fraction Hoffman 55%. Abnormal transmitral Doppler flow profile characteristic of impaired left ventricular relaxation.  Mitral Valve The mitral valve Hoffman structurally normal. There Hoffman trivial mitral regurgitation by color flow and continuous wave Doppler examinations.  Left  Atrium The left atrium Hoffman normal in size.  Aortic Valve The aortic valve Hoffman trileaflet with normal excursion. There Hoffman no evidence of aortic regurgitation by color flow or continuous wave Doppler examinations. There Hoffman no evidence of a hemodynamically important aortic transvalvular gradient; the maximal instantaneous left ventricular outflow velocity Hoffman 1.1 m/s.  Aorta The thoracic aorta Hoffman normal in diameter at the level of the left ventricular outflow tract, sinuses of Valsalva and sinotubular junction.  Pulmonary Artery The pulmonary artery Hoffman not optimally imaged.  Pulmonary Valve The pulmonary valve Hoffman normal. There Hoffman no detectable pulmonary regurgitation by color flow Doppler imaging. There Hoffman no evidence of a hemodynamically important pulmonary transvalvular gradient; the maximal instantaneous right ventricular outflow velocity Hoffman approximately 1.0 m/s.  Right Ventricle There Hoffman normal right ventricular chamber size and contraction.  Tricuspid Valve The tricuspid valve Hoffman structurally normal. There Hoffman trivial tricuspid regurgitation by color flow and continuous wave Doppler imaging; the peak regurgitant velocity Hoffman not well- defined.  Right Atrium The right atrium Hoffman normal in size.  Inferior Vena Cava The inferior vena cava Hoffman normal in size with physiological phasic respiratory change characteristic of normal central venous and right atrial pressures.  Pericardium There Hoffman no evidence of pericardial effusion.    ____________________________________________________________________ Marca Ancona, MD electronically signed on 03/30/2015 11:46:37 AM with status of Final  ASSESSMENT: 49 y.o. 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 2 of 23 lymph nodes were involved.  Margins were  clear.  (2) status post adjuvant chemotherapy consisting of 6 q. three-week doses of docetaxel/ doxorubicin/ cyclophosphamide given between January 2007 and may of 2007, with last dose on 04/20/2006.  (3) status post radiation therapy to the left breast, completed 07/11/2006  (4) began on tamoxifen in early August 2007 and  continued until October 2009. She status post hysterectomy with bilateral salpingo-oophorectomy on 07/15/2008, and was started on letrozole in October of 2009.  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 PET and CT of the chest showed no evidence of metastasis in January 2011.  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 Margins were clear. Patient underwent concurrent left latissimus flap reconstruction and right implant reconstruction.  (7)  status post additional chemotherapy with one cycle of carboplatin/gemcitabine given on 03/11/2010. She then received 5 q. three-week cycles of IV CMF between 03/25/2010 and 06/17/2010.  (8) started on exemestane in January 2012 and continued until late November 2013 when she was hospitalized for apparent TIA at which time her exemestane was stopped and not resumed  (9) METASTATIC DISEASE: evidence of disease recurence noted on chest CT, liver MRI and PET scan in December 2014, with suspicious lung nodules, lymphadenopathy, and 3 lesions in the right lobe of the liver, but no evidence of bony disease and no evidence of brain involvement by brain MRI September 2014. Biopsy of a left supraclavicular lymph node on 12/11/2013 confirmed metastatic carcinoma, estrogen receptor 45% positive, progesterone receptor 17%  positive, with no HER-2 amplification.  (10) fulvestrant started 12/03/2013, discontinued 03/25/2014 with progression  (11) exemestane/ everolimus started 04/04/2014; everolimus held 04/14/2014, with skin rash and mouth soreness; resumed at 5 mg a day as of 04/29/2014, discontinued 05/09/2014 with similar symptoms  (12) continuing exemestane, adding palbociclib 05/26/2014;   (a) palbociclib dose dropped to 75 mg every other day for 21 days beginning with cycle 4  (b) exemestane and Palbociclib discontinued December 2015 with evidence of progression  (13) [facility] referral placed 04/28/2049 -- re-sent 05/23/2014-- shows Pi3KCA and TP53 mutations  (a) [facility] one test requested 12/10/2014-- confirms [employer] results  (b) did not qualify for capecitabine/ BYL 719 trial because of elevated lipase  (14) started eribulin 12/26/2014, 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   (a) dpse dropped from 2g BID to 1.5g BID starting on 04/26/15  (b) stopped 05/17/2015 (after 4 cycles) with progression  (16) started carboplatin/ gemcitabine 05/26/2015, given day one and day 8 of each 21 day cycle for a total of 8 cycles, last dose 11/10/2015, stopped because of cytopenias  (a) given her history of severe neutropenia with this chemotherapy we'll do Neupogen days 2, 3 and 4 of each cycle and onpro day 9  (b) carbo dose dropped by 25% w cycle 2 due to very low platelet nadir  (c) CT scans after 3 cycles (07/27/2015) show measurable response  (d) CT scans after 6 cycles (09/28/2015)show continuing response  (17) Left pleural effusion\   (a) s/p L thoracentesis 05/22/2015, 05/29/2015 and 06/05/2015  (b) attempt at pleurx 06/12/2015 cancelled as effusion loculated and scant  (18) symptomatic anemia  (a) Aranesp started 07/28/2015,  retic <1% on 10/06/2015  (b) Feraheme given 08/04/2015; ferritin >2K on 10/06/2015, last dose on 11/1  PLAN:  Maiko Hoffman very anxious about the possibility of changing agents, but at the same time she understands that we are treading a  very fine line a continuing the current treatment. Her counts have been very low and while they are still responding to transfusion, that can change if she develops antibodies and we may get stuck with no possibility of treatment.  We are proceeding with platelet and red cell transfusions today.   I think it may be prudent to switch to an agent that will not have such a drastic effect on her bone marrow. She could tolerate Doxil well I believe. We discussed the possible toxicities, side effects and complications of this agent including of course mucositis and palmar plantar erythrodysesthesia. Generally though counts are spared. She had an [facility] in April at [facility] and we will use that as her baseline.  As far as her counts today are concerned she will receive red cells and platelets. She Hoffman already scheduled for restaging CT scan of the chest next week and she will see me the next day. At that point assuming her counts and she a lot we will initiate Doxil treatments.  She has a good understanding of this plan. She knows to call for any problems that may develop before the next visit here.Marland Kitchen    Chauncey Cruel, MD   11/17/2015 10:14 AM

## 2015-11-17 NOTE — Progress Notes (Signed)
Pt to receive one unit of PRBC's and one unit of platelets today per [name].

## 2015-11-17 NOTE — Patient Instructions (Addendum)
Blood Transfusion  A blood transfusion is a procedure in which you receive donated blood through an IV tube. You may need a blood transfusion because of illness, surgery, or injury. The blood may come from a donor, or it may be your own blood that you donated previously. The blood given in a transfusion is made up of different types of cells. You may receive:  Red blood cells. These carry oxygen and replace lost blood.  Platelets. These control bleeding.  Plasma. Thishelps blood to clot. If you have hemophilia or another clotting disorder, you may also receive other types of blood products. LET YOUR HEALTH CARE PROVIDER KNOW ABOUT:  Any allergies you have.  All medicines you are taking, including vitamins, herbs, eye drops, creams, and over-the-counter medicines.  Previous problems you or members of your family have had with the use of anesthetics.  Any blood disorders you have.  Previous surgeries you have had.  Any medical conditions you may have.  Any previous reactions you have had during a blood transfusion.  RISKS AND COMPLICATIONS Generally, this is a safe procedure. However, problems may occur, including:  Having an allergic reaction to something in the donated blood.  Fever. This may be a reaction to the white blood cells in the transfused blood.  Iron overload. This can happen from having many transfusions.  Transfusion-related acute lung injury (TRALI). This is a rare reaction that causes lung damage. The cause is not known.TRALI can occur within hours of a transfusion or several days later.  Sudden (acute) or delayed hemolytic reactions. This happens if your blood does not match the cells in your transfusion. Your body's defense system (immune system) may try to attack the new cells. This complication is rare.  Infection. This is rare. BEFORE THE PROCEDURE  You may have a blood test to determine your blood type. This is necessary to know what kind of blood your  body will accept.  If you are going to have a planned surgery, you may donate your own blood. This may be done in case you need to have a transfusion.  If you have had an allergic reaction to a transfusion in the past, you may be given medicine to help prevent a reaction. Take this medicine only as directed by your health care provider.  You will have your temperature, blood pressure, and pulse monitored before the transfusion. PROCEDURE   An IV will be started in your hand or arm.  The bag of donated blood will be attached to your IV tube and given into your vein.  Your temperature, blood pressure, and pulse will be monitored regularly during the transfusion. This monitoring is done to detect early signs of a transfusion reaction.  If you have any signs or symptoms of a reaction, your transfusion will be stopped and you may be given medicine.  When the transfusion is over, your IV will be removed.  Pressure may be applied to the IV site for a few minutes.  A bandage (dressing) will be applied. The procedure may vary among health care providers and hospitals. AFTER THE PROCEDURE  Your blood pressure, temperature, and pulse will be monitored regularly.   This information is not intended to replace advice given to you by your health care provider. Make sure you discuss any questions you have with your health care provider.   Document Released: 11/25/2000 Document Revised: 12/19/2014 Document Reviewed: 10/08/2014 Elsevier Interactive Patient Education 2016 [employer]   Platelet Transfusion  A platelet transfusion   is a procedure in which you receive donated platelets through an IV tube. Platelets are tiny pieces of blood cells. When a blood vessel is damaged, platelets collect in the damaged area to help form a blood clot. This begins the healing process. If your platelet count gets too low, your blood may have trouble clotting.  You may need a platelet transfusion if you have a  condition that causes a low number of platelets (thrombocytopenia). A platelet transfusion may be used to stop or prevent bleeding.  LET YOUR HEALTH CARE PROVIDER KNOW ABOUT:   Any allergies you have.   All medicines you are taking, including vitamins, herbs, eye drops, creams, and over-the-counter medicines.   Previous problems you or members of your family have had with the use of anesthetics.   Any blood disorders you have.   Previous surgeries you have had.   Any medical conditions you may have.   Any reactions you have had during a previous transfusion. RISKS AND COMPLICATIONS Generally, this is a safe procedure. However, problems may occur, including:   Fever with or without chills. The fever usually occurs within the first 4 hours of the transfusion and returns to normal within 48 hours.  Allergic reaction. The reaction is most commonly caused by antibodies your body creates against substances in the transfusion. Signs of an allergic reaction may include itching, hives, difficulty breathing, shock, or low blood pressure.  Sudden (acute) or delayed hemolytic reaction. This rare reaction can occur during the transfusion and up to 28 days after the transfusion. The reaction usually occurs when your body's defense system (immune system) attacks the new platelets. Signs of a hemolytic reaction may include fever, headache, difficulty breathing, low blood pressure, a rapid heartbeat, or pain in your back, abdomen, chest, or IV site.  Transfusion-related acute lung injury (TRALI). TRALI can occur within hours of a transfusion, or several days later. This is a rare reaction that causes lung damage. The cause is not known.  Infection. Signs of this rare complication may include fever, chills, vomiting, a rapid heartbeat, or low blood pressure. BEFORE THE PROCEDURE   You may have a blood test to determine your blood type. This is necessary to find out what kind ofplatelets best  matches your platelets.  If you have had an allergic reaction to a transfusion in the past, you may be given medicine to help prevent a reaction. Take this medicine only as directed by your health care provider.  Your temperature, blood pressure, and pulse will be monitored before the transfusion. PROCEDURE  An IV will be started in your hand or arm.  The transfusion will be attached to your IV tubing. The bag of donated platelets will be attached to your IV tube andgiven into your vein.  Your temperature, blood pressure, and pulse will be monitored regularly during the transfusion. This monitoring is done to help detect early signs of a transfusion reaction.  If you have any signs or symptoms of a reaction, your transfusion will be stopped and you may be given medicine.  When your transfusion is complete, your IV will be removed.  Pressure may be applied to the IV site for a few minutes.  A bandage (dressing) will be applied. The procedure may vary among health care providers and hospitals. AFTER THE PROCEDURE  Your blood pressure, temperature, and pulse will be monitored regularly.   This information is not intended to replace advice given to you by your health care provider. Make sure you   discuss any questions you have with your health care provider.   Document Released: 09/25/2007 Document Revised: 12/19/2014 Document Reviewed: 10/08/2014 Elsevier Interactive Patient Education 2016 [employer]   

## 2015-11-17 NOTE — Progress Notes (Signed)
Critical lab- platelets- 6    [name] and Gentry Fitz, NP aware.

## 2015-11-17 NOTE — Patient Instructions (Signed)

## 2015-11-18 LAB — TYPE AND SCREEN
ABO/RH(D): A POS
ANTIBODY SCREEN: NEGATIVE
UNIT DIVISION: 0
Unit division: 0

## 2015-11-18 LAB — PREPARE PLATELET PHERESIS: UNIT DIVISION: 0

## 2015-11-23 ENCOUNTER — Encounter (HOSPITAL_COMMUNITY): Payer: Self-pay

## 2015-11-23 ENCOUNTER — Ambulatory Visit (HOSPITAL_COMMUNITY)
Admission: RE | Admit: 2015-11-23 | Discharge: 2015-11-23 | Disposition: A | Discharge status: Home / Self Care | Payer: 59 | Source: Ambulatory Visit | Attending: Oncology | Admitting: Oncology

## 2015-11-23 DIAGNOSIS — Z9013 Acquired absence of bilateral breasts and nipples: Secondary | ICD-10-CM | POA: Diagnosis not present

## 2015-11-23 DIAGNOSIS — R590 Localized enlarged lymph nodes: Secondary | ICD-10-CM | POA: Insufficient documentation

## 2015-11-23 DIAGNOSIS — C7889 Secondary malignant neoplasm of other digestive organs: Secondary | ICD-10-CM | POA: Diagnosis not present

## 2015-11-23 DIAGNOSIS — C50312 Malignant neoplasm of lower-inner quadrant of left female breast: Secondary | ICD-10-CM | POA: Diagnosis not present

## 2015-11-23 DIAGNOSIS — C7800 Secondary malignant neoplasm of unspecified lung: Secondary | ICD-10-CM | POA: Insufficient documentation

## 2015-11-23 DIAGNOSIS — R188 Other ascites: Secondary | ICD-10-CM | POA: Insufficient documentation

## 2015-11-23 DIAGNOSIS — C787 Secondary malignant neoplasm of liver and intrahepatic bile duct: Secondary | ICD-10-CM | POA: Insufficient documentation

## 2015-11-23 DIAGNOSIS — C50919 Malignant neoplasm of unspecified site of unspecified female breast: Secondary | ICD-10-CM

## 2015-11-23 MED ORDER — IOHEXOL 300 MG/ML  SOLN
100.0000 mL | Freq: Once | INTRAMUSCULAR | Status: AC | PRN
  Administered 2015-11-23: 100 mL via INTRAVENOUS

## 2015-11-24 ENCOUNTER — Telehealth: Payer: Self-pay | Admitting: Oncology

## 2015-11-24 ENCOUNTER — Other Ambulatory Visit: Payer: Self-pay

## 2015-11-24 ENCOUNTER — Other Ambulatory Visit (HOSPITAL_BASED_OUTPATIENT_CLINIC_OR_DEPARTMENT_OTHER): Payer: 59

## 2015-11-24 ENCOUNTER — Ambulatory Visit (HOSPITAL_BASED_OUTPATIENT_CLINIC_OR_DEPARTMENT_OTHER): Payer: 59

## 2015-11-24 ENCOUNTER — Ambulatory Visit (HOSPITAL_BASED_OUTPATIENT_CLINIC_OR_DEPARTMENT_OTHER): Payer: 59 | Admitting: Oncology

## 2015-11-24 ENCOUNTER — Ambulatory Visit: Payer: 59

## 2015-11-24 VITALS — BP 116/73 | HR 96 | Temp 98.4°F | Resp 18

## 2015-11-24 VITALS — BP 116/78 | HR 109 | Temp 98.7°F | Resp 18 | Ht 63.0 in | Wt 179.9 lb

## 2015-11-24 DIAGNOSIS — Z5111 Encounter for antineoplastic chemotherapy: Secondary | ICD-10-CM

## 2015-11-24 DIAGNOSIS — Z5189 Encounter for other specified aftercare: Secondary | ICD-10-CM

## 2015-11-24 DIAGNOSIS — C50312 Malignant neoplasm of lower-inner quadrant of left female breast: Secondary | ICD-10-CM

## 2015-11-24 DIAGNOSIS — J948 Other specified pleural conditions: Secondary | ICD-10-CM | POA: Diagnosis not present

## 2015-11-24 DIAGNOSIS — C50919 Malignant neoplasm of unspecified site of unspecified female breast: Secondary | ICD-10-CM

## 2015-11-24 DIAGNOSIS — D630 Anemia in neoplastic disease: Secondary | ICD-10-CM

## 2015-11-24 DIAGNOSIS — C50912 Malignant neoplasm of unspecified site of left female breast: Secondary | ICD-10-CM

## 2015-11-24 DIAGNOSIS — C7951 Secondary malignant neoplasm of bone: Secondary | ICD-10-CM

## 2015-11-24 DIAGNOSIS — R530 Neoplastic (malignant) related fatigue: Secondary | ICD-10-CM

## 2015-11-24 DIAGNOSIS — C7931 Secondary malignant neoplasm of brain: Secondary | ICD-10-CM | POA: Diagnosis not present

## 2015-11-24 DIAGNOSIS — Z95828 Presence of other vascular implants and grafts: Secondary | ICD-10-CM

## 2015-11-24 DIAGNOSIS — J90 Pleural effusion, not elsewhere classified: Secondary | ICD-10-CM

## 2015-11-24 LAB — CBC WITH DIFFERENTIAL/PLATELET
BASO%: 0.3 % (ref 0.0–2.0)
BASOS ABS: 0 10*3/uL (ref 0.0–0.1)
EOS ABS: 0 10*3/uL (ref 0.0–0.5)
EOS%: 0.3 % (ref 0.0–7.0)
HCT: 24.4 % — ABNORMAL LOW (ref 34.8–46.6)
HEMOGLOBIN: 7.8 g/dL — AB (ref 11.6–15.9)
LYMPH#: 0.5 10*3/uL — AB (ref 0.9–3.3)
LYMPH%: 14 % (ref 14.0–49.7)
MCH: 33.5 pg (ref 25.1–34.0)
MCHC: 32 g/dL (ref 31.5–36.0)
MCV: 104.7 fL — AB (ref 79.5–101.0)
MONO#: 0.6 10*3/uL (ref 0.1–0.9)
MONO%: 18.2 % — AB (ref 0.0–14.0)
NEUT%: 67.2 % (ref 38.4–76.8)
NEUTROS ABS: 2.3 10*3/uL (ref 1.5–6.5)
Platelets: 32 10*3/uL — ABNORMAL LOW (ref 145–400)
RBC: 2.33 10*6/uL — AB (ref 3.70–5.45)
RDW: 22.6 % — AB (ref 11.2–14.5)
WBC: 3.4 10*3/uL — AB (ref 3.9–10.3)
nRBC: 1 % — ABNORMAL HIGH (ref 0–0)

## 2015-11-24 LAB — COMPREHENSIVE METABOLIC PANEL
ALBUMIN: 2.7 g/dL — AB (ref 3.5–5.0)
ALK PHOS: 154 U/L — AB (ref 40–150)
ALT: 19 U/L (ref 0–55)
ANION GAP: 5 meq/L (ref 3–11)
AST: 28 U/L (ref 5–34)
BILIRUBIN TOTAL: 0.88 mg/dL (ref 0.20–1.20)
BUN: 8.1 mg/dL (ref 7.0–26.0)
CO2: 29 meq/L (ref 22–29)
CREATININE: 0.7 mg/dL (ref 0.6–1.1)
Calcium: 8.3 mg/dL — ABNORMAL LOW (ref 8.4–10.4)
Chloride: 106 mEq/L (ref 98–109)
EGFR: >90 mL/min/{1.73_m2} (ref >90)
GLUCOSE: 100 mg/dL (ref 70–140)
Potassium: 3.6 mEq/L (ref 3.5–5.1)
Sodium: 140 mEq/L (ref 136–145)
TOTAL PROTEIN: 4.8 g/dL — AB (ref 6.4–8.3)

## 2015-11-24 LAB — PREPARE RBC (CROSSMATCH)

## 2015-11-24 MED ORDER — METHYLPREDNISOLONE SODIUM SUCC 125 MG IJ SOLR
INTRAMUSCULAR | Status: AC
  Filled 2015-11-24: qty 2

## 2015-11-24 MED ORDER — HEPARIN SOD (PORK) LOCK FLUSH 100 UNIT/ML IV SOLN
500.0000 [IU] | Freq: Once | INTRAVENOUS | Status: AC | PRN
  Administered 2015-11-24: 500 [IU]
  Filled 2015-11-24: qty 5

## 2015-11-24 MED ORDER — DIPHENHYDRAMINE HCL 50 MG/ML IJ SOLN
25.0000 mg | Freq: Once | INTRAMUSCULAR | Status: AC
  Administered 2015-11-24: 25 mg via INTRAVENOUS

## 2015-11-24 MED ORDER — SODIUM CHLORIDE 0.9 % IV SOLN
Freq: Once | INTRAVENOUS | Status: AC
  Administered 2015-11-24: 11:00:00 via INTRAVENOUS

## 2015-11-24 MED ORDER — SODIUM CHLORIDE 0.9 % IJ SOLN
10.0000 mL | INTRAMUSCULAR | Status: DC | PRN
  Administered 2015-11-24: 10 mL
  Filled 2015-11-24: qty 10

## 2015-11-24 MED ORDER — SODIUM CHLORIDE 0.9 % IV SOLN
Freq: Once | INTRAVENOUS | Status: AC
  Administered 2015-11-24: 11:00:00 via INTRAVENOUS
  Filled 2015-11-24: qty 4

## 2015-11-24 MED ORDER — FAMOTIDINE IN NACL 20-0.9 MG/50ML-% IV SOLN
20.0000 mg | Freq: Two times a day (BID) | INTRAVENOUS | Status: DC
  Administered 2015-11-24: 20 mg via INTRAVENOUS

## 2015-11-24 MED ORDER — METHYLPREDNISOLONE SODIUM SUCC 125 MG IJ SOLR
125.0000 mg | Freq: Once | INTRAMUSCULAR | Status: AC
  Administered 2015-11-24: 125 mg via INTRAVENOUS

## 2015-11-24 MED ORDER — DARBEPOETIN ALFA 500 MCG/ML IJ SOSY
500.0000 ug | PREFILLED_SYRINGE | Freq: Once | INTRAMUSCULAR | Status: AC
  Administered 2015-11-24: 500 ug via SUBCUTANEOUS
  Filled 2015-11-24: qty 1

## 2015-11-24 MED ORDER — DEXTROSE 5 % IV SOLN
40.0000 mg/m2 | Freq: Once | INTRAVENOUS | Status: AC
  Administered 2015-11-24: 76 mg via INTRAVENOUS
  Filled 2015-11-24: qty 38

## 2015-11-24 MED ORDER — VALACYCLOVIR HCL 1 G PO TABS: 1000.0000 mg | ORAL_TABLET | Freq: Every day | ORAL | Status: DC

## 2015-11-24 MED ORDER — DEXTROSE 5 % IV SOLN
Freq: Once | INTRAVENOUS | Status: AC
  Administered 2015-11-24: 10:00:00 via INTRAVENOUS

## 2015-11-24 MED ORDER — PEGFILGRASTIM 6 MG/0.6ML ~~LOC~~ PSKT
6.0000 mg | PREFILLED_SYRINGE | Freq: Once | SUBCUTANEOUS | Status: AC
  Administered 2015-11-24: 6 mg via SUBCUTANEOUS
  Filled 2015-11-24: qty 0.6

## 2015-11-24 MED ORDER — SODIUM CHLORIDE 0.9 % IJ SOLN
10.0000 mL | INTRAMUSCULAR | Status: DC | PRN
  Administered 2015-11-24: 10 mL via INTRAVENOUS
  Filled 2015-11-24: qty 10

## 2015-11-24 NOTE — Telephone Encounter (Signed)
Appointments made and patient will get a new avs in chemo °

## 2015-11-24 NOTE — Patient Instructions (Signed)
[facility] Discharge Instructions for Patients Receiving Chemotherapy  Today you received the following chemotherapy agents: Doxil.  To help prevent nausea and vomiting after your treatment, we encourage you to take your nausea medication as directed.  If you develop nausea and vomiting that is not controlled by your nausea medication, call the clinic.   BELOW ARE SYMPTOMS THAT SHOULD BE REPORTED IMMEDIATELY:  *FEVER GREATER THAN 100.5 F  *CHILLS WITH OR WITHOUT FEVER  NAUSEA AND VOMITING THAT IS NOT CONTROLLED WITH YOUR NAUSEA MEDICATION  *UNUSUAL SHORTNESS OF BREATH  *UNUSUAL BRUISING OR BLEEDING  TENDERNESS IN MOUTH AND THROAT WITH OR WITHOUT PRESENCE OF ULCERS  *URINARY PROBLEMS  *BOWEL PROBLEMS  UNUSUAL RASH Items with * indicate a potential emergency and should be followed up as soon as possible.  Feel free to call the clinic you have any questions or concerns. The clinic phone number is [phone].  Please show the CHEMO ALERT CARD at check-in to the Emergency Department and triage nurse.   Doxorubicin Liposomal injection What is this medicine? LIPOSOMAL DOXORUBICIN (LIP oh som al dox oh ROO bi sin) is a chemotherapy drug. This medicine is used to treat many kinds of cancer like Kaposi's sarcoma, multiple myeloma, and ovarian cancer. This medicine may be used for other purposes; ask your health care provider or pharmacist if you have questions. What should I tell my health care provider before I take this medicine? They need to know if you have any of these conditions: -blood disorders -heart disease -infection (especially a virus infection such as chickenpox, cold sores, or herpes) -liver disease -recent or ongoing radiation therapy -an unusual or allergic reaction to doxorubicin, other chemotherapy agents, soybeans, other medicines, foods, dyes, or preservatives -pregnant or trying to get pregnant -breast-feeding How should I use this  medicine? This drug is given as an infusion into a vein. It is administered in a hospital or clinic by a specially trained health care professional. If you have pain, swelling, burning or any unusual feeling around the site of your injection, tell your health care professional right away. Talk to your pediatrician regarding the use of this medicine in children. Special care may be needed. Overdosage: If you think you have taken too much of this medicine contact a poison control center or emergency room at once. NOTE: This medicine is only for you. Do not share this medicine with others. What if I miss a dose? It is important not to miss your dose. Call your doctor or health care professional if you are unable to keep an appointment. What may interact with this medicine? Do not take this medicine with any of the following medications: -zidovudine This medicine may also interact with the following medications: -medicines to increase blood counts like filgrastim, pegfilgrastim, sargramostim -vaccines Talk to your doctor or health care professional before taking any of these medicines: -acetaminophen -aspirin -ibuprofen -ketoprofen -naproxen This list may not describe all possible interactions. Give your health care provider a list of all the medicines, herbs, non-prescription drugs, or dietary supplements you use. Also tell them if you smoke, drink alcohol, or use illegal drugs. Some items may interact with your medicine. What should I watch for while using this medicine? Your condition will be monitored carefully while you are receiving this medicine. You will need important blood work done while you are taking this medicine. This drug may make you feel generally unwell. This is not uncommon, as chemotherapy can affect healthy cells as well as   cancer cells. Report any side effects. Continue your course of treatment even though you feel ill unless your doctor tells you to stop. Your urine may  turn orange-red for a few days after your dose. This is not blood. If your urine is dark or brown, call your doctor. In some cases, you may be given additional medicines to help with side effects. Follow all directions for their use. Call your doctor or health care professional for advice if you get a fever (100.5 degrees F or higher), chills or sore throat, or other symptoms of a cold or flu. Do not treat yourself. This drug decreases your body's ability to fight infections. Try to avoid being around people who are sick. This medicine may increase your risk to bruise or bleed. Call your doctor or health care professional if you notice any unusual bleeding. Be careful brushing and flossing your teeth or using a toothpick because you may get an infection or bleed more easily. If you have any dental work done, tell your dentist you are receiving this medicine. Avoid taking products that contain aspirin, acetaminophen, ibuprofen, naproxen, or ketoprofen unless instructed by your doctor. These medicines may hide a fever. Men and women of childbearing age should use effective birth control methods while using taking this medicine. Do not become pregnant while taking this medicine. There is a potential for serious side effects to an unborn child. Talk to your health care professional or pharmacist for more information. Do not breast-feed an infant while taking this medicine. Talk to your doctor about your risk of cancer. You may be more at risk for certain types of cancers if you take this medicine. What side effects may I notice from receiving this medicine? Side effects that you should report to your doctor or health care professional as soon as possible: -allergic reactions like skin rash, itching or hives, swelling of the face, lips, or tongue -low blood counts - this medicine may decrease the number of white blood cells, red blood cells and platelets. You may be at increased risk for infections and  bleeding. -signs of hand-foot syndrome - tingling or burning, redness, flaking, swelling, small blisters, or small sores on the palms of your hands or the soles of your feet -signs of infection - fever or chills, cough, sore throat, pain or difficulty passing urine -signs of decreased platelets or bleeding - bruising, pinpoint red spots on the skin, black, tarry stools, blood in the urine -signs of decreased red blood cells - unusually weak or tired, fainting spells, lightheadedness -back pain, chills, facial flushing, fever, headache, tightness in the chest or throat during the infusion -breathing problems -chest pain -fast, irregular heartbeat -mouth pain, redness, sores -pain, swelling, redness at site where injected -pain, tingling, numbness in the hands or feet -swelling of ankles, feet, or hands -vomiting Side effects that usually do not require medical attention (report to your doctor or health care professional if they continue or are bothersome): -diarrhea -hair loss -loss of appetite -nail discoloration or damage -nausea -red or watery eyes -red colored urine -stomach upset This list may not describe all possible side effects. Call your doctor for medical advice about side effects. You may report side effects to FDA at 1-800-FDA-1088. Where should I keep my medicine? This drug is given in a hospital or clinic and will not be stored at home. NOTE: This sheet is a summary. It may not cover all possible information. If you have questions about this medicine, talk to your   doctor, pharmacist, or health care provider.    2016, Elsevier/[employer]. (2012-08-17 10:12:56)  

## 2015-11-24 NOTE — Progress Notes (Signed)
Addendum: Patient presented to the [facility] today to receive cycle one of her Doxil chemotherapy.  She had received a small amount of the Doxil infusion; and developed some fairly significant low back pain that appeared to be cramping/spasms.  Doxil infusion was held; and patient received hypersensitivity protocol medications which completely resolved her symptoms.  Patient was able to complete all of her chemotherapy as directed today.

## 2015-11-24 NOTE — Patient Instructions (Signed)

## 2015-11-24 NOTE — Progress Notes (Signed)
ID: Kristin Hoffman OB: January 08, 1966  MR#: [id number]  EHM#:[id number]  PCP: Drake Leach, MD GYN:  [name] SU: [name] OTHER MD: [name], [name]  CHIEF COMPLAINT:  Stage IV breast cancer  CURRENT TREATMENT: Liposomal doxorubicin  BREAST CANCER HISTORY: This patient was previously followed by [name], and was transferred to [name] service as of 08/20/2013.  At the age of 49, the patient had a screening mammogram as a baseline. She had recently given birth and was on birth control pills at that time. The mammogram in November 2006 showed an area of architectural distortion in the left lower inner quadrant. Subsequently an ultrasound was obtained of the left breast showing a 2.0 x 1.5 x 1.4 cm mass, at the 8:00 position, 4 cm from the nipple. A core biopsy performed on 10/21/2005 showed an invasive mammary carcinoma, ER +70%, PR +41%, HER-2/neu negative, with proliferation marker of 38%. [phone])  Breast MRI on 11/08/2005 confirmed a 2.5 cm spiculated mass in the left lower inner quadrant with 3 small satellite nodules adjacent to the primary mass: 3.5 cm posterior medial to the primary mass, measuring 9 mm; 1.5 cm anterior medial to the primary mass measuring 5 mm; and 2 cm medial to the primary mass measuring 5 mm. No axillary adenopathy was noted. No suspicious masses or enhancement are noted in the right breast.  [name] underwentt left lumpectomy under the care of Dr. 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 2 of 23 lymph nodes were involved.  Margins were clear.  She received adjuvant chemotherapy consisting of 6 q. three-week doses of docetaxel/doxorubicin/cyclophosphamide given between January 2007 and may of 2007, with last dose on 04/20/2006. She underwent radiation therapy completed 07/11/2006, after which she began on tamoxifen in early August  2007.  She underwent hysterectomy with bilateral salpingo-oophorectomy on 07/15/2008, and was started on letrozole in October of 2009.  A mammogram 11/10/2009 showed microcalcifications in the left breast, and a subsequent biopsy on 11/18/2009 confirmed invasive mammary carcinoma in the left breast. PET and CT of the chest showed no evidence of metastasis in January 2011.  She underwent bilateral mastectomies 01/25/2010, with the right breast showing no evidence of malignancy; in the left breast showing a 1.0 cm grade 2 invasive ductal carcinoma with high-grade DCIS. Tumor was ER +22%, PR negative, HER-2/neu negative, with proliferation marker of 13%. Margins were clear. Patient underwent concurrent left latissimus flap reconstruction and right implant reconstruction.  She received additional chemotherapy with one cycle of carboplatin/gemcitabine given on 03/11/2010. She then received 5 q. three-week cycles of IV CMF between 03/25/2010 and 06/17/2010.  She was started on exemestane in January 2012 and continued until late November 2013 when she was hospitalized for apparent TIA.   Her subsequent history is as detailed below  INTERVAL HISTORY: Kristin Hoffman returns today for follow up of her stage IV, triple negative breast cancer accompanied by her husband [name]. Today is day 1 cycle 1 of 4 planned cycles of liposomal doxorubicin, to be given 3 weeks apart as tolerated.--Judeen Hammans had restaging scans yesterday, which show continuing response to her carboplatin and gemcitabine treatments. The reason we are changing is because of her very low counts. We are hoping she will be able to tolerate the Doxil without significant bone marrow suppression  REVIEW OF SYSTEMS: Kristin Hoffman was transfused December 7. She has been getting blood pretty much every 10-14  days. She denies any bleeding or bruising. She remains very fatigued. She is able to walk all the way up the stairs in her house but gets very winded when she does. She does  not have chest pain or pressure however. She was able to travel to [place] for [place] and enjoyed that. She also has shin splints bilaterally. She has had no fever, no nausea or vomiting, and no mouth sores although she has had a sore throat recently. She has been using a fairly harsh mouthwash. She has some taste alteration. She can be forgetful and sometimes has trouble finding words. There have been no unusual headaches. There have been no sudden visual changes although she thinks she needs glasses. There has been no balance or staggering issues, and no falls. A detailed review of systems today was otherwise stable    PAST MEDICaL HISTORY: Past Medical History  Diagnosis Date  . Depression   . Reflux   . Hx-TIA (transient ischemic attack) 11/22/2013  . Chronic headaches 11/22/2013  . Anxiety 11/22/2013  . Breast cancer ([place])   . Cancer ([place])   . Breast cancer metastasized to multiple sites [facility]) 12/24/2013    PAST SURGICAL HISTORY: Past Surgical History  Procedure Laterality Date  . Mastectomy w/ nodes partial    . Mastectomy    . Breast reconstruction    . Portacath placement      x 2  . Portacath removal      x 2  . Abdominal hysterectomy    . Tee without cardioversion  11/12/2012    Procedure: TRANSESOPHAGEAL ECHOCARDIOGRAM (TEE);  Surgeon: Candee Furbish, MD;  [facility] ENDOSCOPY;  Service: Cardiovascular;  Laterality: N/A;  This TEE may be Dr. Marlou Porch or a [facility] Dr    FAMILY HISTORY Both parents are alive and well. Patient has one sister who is 48 years younger and is in good health. No other history of breast or ovarian cancer in the family. Family History  Problem Relation Age of Onset  . Diabetes Mellitus II Neg Hx   . Hypertension Neg Hx     GYNECOLOGIC HISTORY:   (Updated January 2015) G2P2, menarche at age 34 with irregular menses. On birth control pills in the past. On Clomid to induce ovulation with first pregnancy. Also had preeclampsia with first pregnancy.  Status post hysterectomy and bilateral salpingo-oophorectomy in August 2009.  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 She is originally from [place]. She's been married to [place], for 13 years. He works as an Pharmacist, hospital at [employer].   ADVANCED DIRECTIVES:  Not in place. At the clinic visit 11/24/2015 the patient was given the appropriate forms to complete and notarize at her discretion.    HEALTH MAINTENANCE:  (Updated 11/22/2013) Social History  Substance Use Topics  . Smoking status: Never Smoker   . Smokeless tobacco: Never Used  . Alcohol Use: Yes     Comment: rarely     Colonoscopy:  Never  PAP: S/P LAVH/BSO in August 2009  Bone density: Never  Lipid panel: Not on file   Allergies  Allergen Reactions  . Afinitor [Everolimus] Hives and Itching    Current Outpatient Prescriptions  Medication Sig Dispense Refill  . acidophilus (RISAQUAD) CAPS capsule Take 1 capsule by mouth every morning.     . Biotin 5000 MCG CAPS Take 5,000 mcg by mouth every morning.  30 capsule   .  cetirizine (ZYRTEC) 10 MG tablet Take 10 mg by mouth at bedtime.     . Cholecalciferol 2000 UNITS CHEW Chew 1 tablet by mouth daily with breakfast.     . diphenoxylate-atropine (LOMOTIL) 2.5-0.025 MG tablet TAKE TWO TABLETS 4 TIMES A DAY AS NEEDED 30 tablet 0  . escitalopram (LEXAPRO) 20 MG tablet Take 1 tablet (20 mg total) by mouth daily. 30 tablet 12  . Ibuprofen (ADVIL) 200 MG CAPS Take 2 capsules (400 mg total) by mouth 3 (three) times daily with meals as needed. 120 each 0  . LORazepam (ATIVAN) 0.5 MG tablet Take 1 tablet (0.5 mg total) by mouth every 6 (six) hours as needed for anxiety. 120 tablet 1  . Melatonin 10 MG TABS Take 1 tablet by mouth at bedtime.    . montelukast (SINGULAIR) 10 MG tablet Take 10 mg by mouth daily with breakfast.     . Multiple Vitamin (MULTIVITAMIN) tablet Take 1 tablet  by mouth every morning.     . nystatin (MYCOSTATIN) 100000 UNIT/ML suspension Take 5 mLs (500,000 Units total) by mouth 4 (four) times daily. 240 mL 1  . omeprazole (PRILOSEC) 40 MG capsule Take 40 mg by mouth every morning.     . ondansetron (ZOFRAN) 8 MG tablet TAKE 1 TABLET BY MOUTH 2 TIMES DAILY. START DAY AFTER CHEMO FOR 2 DAYS. THEN TAKE AS NEEDED NAUSEA 30 tablet 1  . prochlorperazine (COMPAZINE) 10 MG tablet Take 10 mg by mouth every 6 (six) hours as needed for nausea or vomiting.     . traMADol (ULTRAM) 50 MG tablet Take 1 tablet (50 mg total) by mouth every 6 (six) hours as needed. 120 tablet 1  . valACYclovir (VALTREX) 1000 MG tablet Take 1 tablet (1,000 mg total) by mouth daily. 60 tablet 4   No current facility-administered medications for this visit.    OBJECTIVE: Young white woman in no acute distress Filed Vitals:   11/24/15 0836  BP: 116/78  Pulse: 109  Temp: 98.7 F (37.1 C)  Resp: 18  Body mass index is 31.88 kg/(m^2).  ECOG: 2 Filed Weights   11/24/15 0836  Weight: 179 lb 14.4 oz (81.602 kg)   Sclerae unicteric, EOMs intact Oropharynx clear, slightly dry, no thrush or other lesions No cervical or supraclavicular adenopathy Lungs no rales or rhonchi Heart regular rate and rhythm Abd soft, nontender, positive bowel sounds MSK no focal spinal tenderness, no upper extremity lymphedema Neuro: nonfocal, well oriented, appropriate affect Breasts: Deferred LAB RESULTS:  CBC Latest Ref Rng 11/17/2015 11/13/2015 11/10/2015  WBC 3.9 - 10.3 10e3/uL 4.5 12.3(H) 3.4(L)  Hemoglobin 11.6 - 15.9 g/dL 7.7(L) 9.0(L) 9.9(L)  Hematocrit 34.8 - 46.6 % 22.8(L) 28.1(L) 29.2(L)  Platelets 145 - 400 10e3/uL 6(LL) 29(L) 38(L)   CMP Latest Ref Rng 11/17/2015 11/13/2015 11/10/2015  Glucose 70 - 140 mg/dl 94 98 109  BUN 7.0 - 26.0 mg/dL 14.4 14.7 7.2  Creatinine 0.6 - 1.1 mg/dL 0.6 0.7 0.7  Sodium 136 - 145 mEq/L 140 141 140  Potassium 3.5 - 5.1 mEq/L 4.0 3.7 3.6  Chloride 101 - 111  mmol/L - - -  CO2 22 - 29 mEq/L 28 29 29   Calcium 8.4 - 10.4 mg/dL 8.4 8.6 8.5  Total Protein 6.4 - 8.3 g/dL 4.8(L) 5.0(L) 5.0(L)  Total Bilirubin 0.20 - 1.20 mg/dL 1.14 1.49(H) 1.28(H)  Alkaline Phos 40 - 150 U/L 168(H) 161(H) 162(H)  AST 5 - 34 U/L 28 32 31  ALT 0 - 55 U/L  19 23 21     STUDIES: Ct Chest W Contrast  11/23/2015  CLINICAL DATA:  Recurrent left breast cancer, status post bilateral mastectomy, chemotherapy in progress, XRT complete. Status post hysterectomy. Shortness of breath. Diarrhea. Chest discomfort x1 month. EXAM: CT CHEST, ABDOMEN, AND PELVIS WITH CONTRAST TECHNIQUE: Multidetector CT imaging of the chest, abdomen and pelvis was performed following the standard protocol during bolus administration of intravenous contrast. CONTRAST:  115m OMNIPAQUE IOHEXOL 300 MG/ML  SOLN COMPARISON:  [id number]: CT CHEST FINDINGS Mediastinum/Nodes: The heart is normal in size. No pericardial effusion. Aberrant right subclavian artery. Right chest port terminates in the upper right atrium. Improving thoracic nodal metastases, including a --12 mm short axis left supraclavicular node (series 2/ image 9), previously 14 mm --9 mm short axis high right paratracheal node (series 2/image 15), previously 12 mm --10 mm short axis left prevascular node (series 2/image 19), previously 15 mm Additional small calcified mediastinal nodes, benign. Status post left axillary lymph node dissection. Visualized thyroid is unremarkable. Lungs/Pleura: 1.3 x 1.3 cm necrotic pleural-based nodule along the anterior right middle lobe (series 2/image 31), previously 1.5 x 1.5 cm. Small right pleural effusion along the major fissure and posterior lung base, mildly improved. 7.1 x 2.1 cm necrotic mass along the anterior left upper chest/chest wall (series 2/image 21), previously 8.1 x 2.8 cm. Associated small left pleural effusion, some of which remains loculated anteriorly along the left chest wall mass. Again noted is  interlobular septal thickening with scattered pulmonary nodularity, worrisome for lymphangitic spread of tumor. 7 mm nodule in the anterior left upper lobe (series 4/image 16), previously 9 mm. Additional 8 x 5 mm nodule in the anterior left upper lobe (series 4/image 13), previously 9 x 4 mm, unchanged. Additional scattered discrete nodularity is not well visualized on the current study. No pneumothorax. Musculoskeletal: Status post bilateral mastectomy with reconstruction. Mild thoracic dextroscoliosis with degenerative changes. No focal osseous lesions. CT ABDOMEN PELVIS FINDINGS Hepatobiliary: Although difficult to discretely measure, the suspected metastasis along the anterior right hepatic dome measures approximately 3.9 x 3.6 cm on the current study (series 2/ image 44), previously 4.5 x 4.9 cm. Additional 10 x 10 mm lesion along the anterior caudate (series 2/ image 55), previously 1.6 x 1.3 cm. Additional scattered areas of heterogeneous perfusion in the liver. Again noted is mild gallbladder wall thickening. No intrahepatic or extrahepatic ductal dilatation. Pancreas: 2.5 x 1.7 cm hypoenhancing lesion along the pancreatic head (series 2/ image 59), previously 2.7 x 1.8 cm. Associated atrophy the pancreatic body/ tail. No pancreatic ductal dilatation. Spleen: Within normal limits. Adrenals/Urinary Tract: Adrenal glands are within normal limits. Kidneys are within normal limits.  No hydronephrosis. Bladder is mildly thick-walled although underdistended. Stomach/Bowel: Stomach is within normal limits. No evidence of bowel obstruction. Normal appendix (series 2/image 87). Vascular/Lymphatic: No evidence of abdominal aortic aneurysm. Circumaortic left renal vein. Again noted is mild soft tissue stranding adjacent to the caudate (series 2/image 56), possibly reflecting nodal soft tissue, equivocal. Otherwise, no suspicious abdominopelvic lymphadenopathy. Reproductive: Status post hysterectomy. No adnexal masses.  Other: Small volume abdominopelvic ascites. No gross peritoneal nodularity. Musculoskeletal: Mild lumbar levoscoliosis. No focal osseous lesions. IMPRESSION: Status post bilateral mastectomy with left axillary lymph node dissection. Improving pleural/parenchymal pulmonary metastases with suspected lymphangitic spread, as described above. Improving thoracic lymphadenopathy. Improving hepatic and pancreatic metastases. Stable small volume abdominopelvic ascites. No gross peritoneal disease. Electronically Signed   By: SJulian HyM.D.   On: 11/23/2015 12:42   Ct  Abdomen Pelvis W Contrast  11/23/2015  CLINICAL DATA:  Recurrent left breast cancer, status post bilateral mastectomy, chemotherapy in progress, XRT complete. Status post hysterectomy. Shortness of breath. Diarrhea. Chest discomfort x1 month. EXAM: CT CHEST, ABDOMEN, AND PELVIS WITH CONTRAST TECHNIQUE: Multidetector CT imaging of the chest, abdomen and pelvis was performed following the standard protocol during bolus administration of intravenous contrast. CONTRAST:  152m OMNIPAQUE IOHEXOL 300 MG/ML  SOLN COMPARISON:  [id number]: CT CHEST FINDINGS Mediastinum/Nodes: The heart is normal in size. No pericardial effusion. Aberrant right subclavian artery. Right chest port terminates in the upper right atrium. Improving thoracic nodal metastases, including a --12 mm short axis left supraclavicular node (series 2/ image 9), previously 14 mm --9 mm short axis high right paratracheal node (series 2/image 15), previously 12 mm --10 mm short axis left prevascular node (series 2/image 19), previously 15 mm Additional small calcified mediastinal nodes, benign. Status post left axillary lymph node dissection. Visualized thyroid is unremarkable. Lungs/Pleura: 1.3 x 1.3 cm necrotic pleural-based nodule along the anterior right middle lobe (series 2/image 31), previously 1.5 x 1.5 cm. Small right pleural effusion along the major fissure and posterior lung  base, mildly improved. 7.1 x 2.1 cm necrotic mass along the anterior left upper chest/chest wall (series 2/image 21), previously 8.1 x 2.8 cm. Associated small left pleural effusion, some of which remains loculated anteriorly along the left chest wall mass. Again noted is interlobular septal thickening with scattered pulmonary nodularity, worrisome for lymphangitic spread of tumor. 7 mm nodule in the anterior left upper lobe (series 4/image 16), previously 9 mm. Additional 8 x 5 mm nodule in the anterior left upper lobe (series 4/image 13), previously 9 x 4 mm, unchanged. Additional scattered discrete nodularity is not well visualized on the current study. No pneumothorax. Musculoskeletal: Status post bilateral mastectomy with reconstruction. Mild thoracic dextroscoliosis with degenerative changes. No focal osseous lesions. CT ABDOMEN PELVIS FINDINGS Hepatobiliary: Although difficult to discretely measure, the suspected metastasis along the anterior right hepatic dome measures approximately 3.9 x 3.6 cm on the current study (series 2/ image 44), previously 4.5 x 4.9 cm. Additional 10 x 10 mm lesion along the anterior caudate (series 2/ image 55), previously 1.6 x 1.3 cm. Additional scattered areas of heterogeneous perfusion in the liver. Again noted is mild gallbladder wall thickening. No intrahepatic or extrahepatic ductal dilatation. Pancreas: 2.5 x 1.7 cm hypoenhancing lesion along the pancreatic head (series 2/ image 59), previously 2.7 x 1.8 cm. Associated atrophy the pancreatic body/ tail. No pancreatic ductal dilatation. Spleen: Within normal limits. Adrenals/Urinary Tract: Adrenal glands are within normal limits. Kidneys are within normal limits.  No hydronephrosis. Bladder is mildly thick-walled although underdistended. Stomach/Bowel: Stomach is within normal limits. No evidence of bowel obstruction. Normal appendix (series 2/image 87). Vascular/Lymphatic: No evidence of abdominal aortic aneurysm.  Circumaortic left renal vein. Again noted is mild soft tissue stranding adjacent to the caudate (series 2/image 56), possibly reflecting nodal soft tissue, equivocal. Otherwise, no suspicious abdominopelvic lymphadenopathy. Reproductive: Status post hysterectomy. No adnexal masses. Other: Small volume abdominopelvic ascites. No gross peritoneal nodularity. Musculoskeletal: Mild lumbar levoscoliosis. No focal osseous lesions. IMPRESSION: Status post bilateral mastectomy with left axillary lymph node dissection. Improving pleural/parenchymal pulmonary metastases with suspected lymphangitic spread, as described above. Improving thoracic lymphadenopathy. Improving hepatic and pancreatic metastases. Stable small volume abdominopelvic ascites. No gross peritoneal disease. Electronically Signed   By: SJulian HyM.D.   On: 11/23/2015 12:42   ASSESSMENT: 49y.o. [place] woman  (1)  status post left lumpectomy under the care of Dr. 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 2 of 23 lymph nodes were involved.  Margins were clear.  (2) status post adjuvant chemotherapy consisting of 6 q. three-week doses of docetaxel/ doxorubicin/ cyclophosphamide given between January 2007 and may of 2007, with last dose on 04/20/2006.  (3) status post radiation therapy to the left breast, completed 07/11/2006  (4) began on tamoxifen in early August 2007 and continued until October 2009. She status post hysterectomy with bilateral salpingo-oophorectomy on 07/15/2008, and was started on letrozole in October of 2009.  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 PET and CT of the chest showed no evidence of metastasis in January 2011.  (6) status post bilateral mastectomies 01/25/2010, with the right  breast showing no evidence of malignancy; in the left breast showing a 1.0 cm grade 2 invasive ductal carcinoma,  ER +22%, PR negative, HER-2/neu negative, with proliferation marker of 13%. Margins were clear. Patient underwent concurrent left latissimus flap reconstruction and right implant reconstruction.  (7)  status post additional chemotherapy with one cycle of carboplatin/gemcitabine given on 03/11/2010. She then received 5 q. three-week cycles of IV CMF between 03/25/2010 and 06/17/2010.  (8) started on exemestane in January 2012 and continued until late November 2013 when she was hospitalized for apparent TIA at which time her exemestane was stopped and not resumed  (9) METASTATIC DISEASE: evidence of disease recurence noted on chest CT, liver MRI and PET scan in December 2014, with suspicious lung nodules, lymphadenopathy, and 3 lesions in the right lobe of the liver, but no evidence of bony disease and no evidence of brain involvement by brain MRI September 2014. Biopsy of a left supraclavicular lymph node on 12/11/2013 confirmed metastatic carcinoma, estrogen receptor 45% positive, progesterone receptor 17% positive, with no HER-2 amplification.  (10) fulvestrant started 12/03/2013, discontinued 03/25/2014 with progression  (11) exemestane/ everolimus started 04/04/2014; everolimus held 04/14/2014, with skin rash and mouth soreness; resumed at 5 mg a day as of 04/29/2014, discontinued 05/09/2014 with similar symptoms  (12) continuing exemestane, adding palbociclib 05/26/2014;   (a) palbociclib dose dropped to 75 mg every other day for 21 days beginning with cycle 4  (b) exemestane and Palbociclib discontinued December 2015 with evidence of progression  (13) [facility] referral placed 04/28/2049 -- re-sent 05/23/2014-- shows Pi3KCA and TP53 mutations  (a) [facility] one test requested 12/10/2014-- confirms [employer] results  (b) did not qualify for capecitabine/ BYL 719 trial because of elevated  lipase  (14) started eribulin 12/26/2014, 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   (a) dpse dropped from 2g BID to 1.5g BID starting on 04/26/15  (b) stopped 05/17/2015 (after 4 cycles) with progression  (16) started carboplatin/ gemcitabine 05/26/2015, given day one and day 8 of each 21 day cycle for a total of 8 cycles, last dose 11/10/2015, stopped because of persistent cytopenias  (a) given her history of severe neutropenia with this chemotherapy we'll do Neupogen days 2, 3 and 4 of each cycle and onpro day 9  (b) carbo dose dropped by 25% w cycle 2 due to very low platelet nadir  (c) CT scans  after 3 cycles (07/27/2015) show measurable response  (d) CT scans after 6 cycles (09/28/2015)show continuing response  (e) CT scans post-treatment (11/23/2015) shows continuing response  (17) Left pleural effusion\   (a) s/p L thoracentesis 05/22/2015, 05/29/2015 and 06/05/2015  (b) attempt at pleurx 06/12/2015 cancelled as effusion loculated and scant  (18) symptomatic anemia  (a) Aranesp started 07/28/2015, retic <1% on 10/06/2015  (b) Feraheme given 08/04/2015; ferritin >2K on 10/06/2015, last dose on 11/1  (19) liposomal doxorubicin started 11/24/2015, repeated every 21 days w onpro support  (a) echo 03/23/2015 shows an EF of 55%  PLAN:  We reviewed the results of her CT scans, which are favorable. Ideally we would continue with the carboplatin and gemcitabine, but if we tried to do that I think we would either have to reduce the doses or increase the treatment interval or both, compromising results. Also we are transfusing her so frequently we are at risk of immunization. That would severely limit the amount of support we could give her.  Granting all that she understands while we are switching to Doxil. We  discussed the possible toxicities side effects and complications of that agent. We're going to try to treat her every 21 days, with onpro support. She may need platelet and red cell transfusions at least initially because of the recent chemotherapy.  I am also starting her on Valtrex prophylactically because of concerns regarding possible mucositis. I have asked her not to use harsh mouthwash containing peroxide. She can use saline rinses, or magic mouthwash.  We will check her lab work December 19 and December 23 and transfuse as needed. Her next treatment will be January 3. Judeen Hammans knows to call for any problems that may develop before then.   Chauncey Cruel, MD   11/24/2015 8:56 AM

## 2015-11-24 NOTE — Progress Notes (Signed)
1025: WBC 3.4, HGB 7.8, Platelets 32, [name] aware. Okay to proceed with treatment today. Pt will be scheduled for blood transfusion one day this week, type and cross to be drawn prior to discharge today per [facility]. [id number] Pt reports "back spasms that radiate to front" rating pain a 9 out if 10 at this time. Doxil stopped and NS started wide open. and O2 at 2 L [place] started.  Selena Lesser NP aware and to infusion to assess and orders are given and followed through to give Benadryl 25 mg IV, Soul medrol 125 mg IV and Pepcid 20 mg IV, VS stable at all times. At 1134 pt reports pain has resolved.  At [place] NP to infusion to reassess. Orders given to restart Doxil at this time. Doxil restarted and pt educated to notify nurse of any changes.  1300: pt tolerated remainder of treatment well and no new complaints.  1330 type and cross drawn and given to lab. Copy of appoints printed and given to pt. Pt aware that she is to [facility] to receive blood and platelet transfusion on Thursday and to keep blue bracelet on until then. Pt verbalizes understanding.  1345: pt and VS stable, per Cyndee Bacon okay to discharge patient home. Pt stable at time of discharge.

## 2015-11-25 ENCOUNTER — Encounter: Payer: Self-pay | Admitting: Nurse Practitioner

## 2015-11-26 ENCOUNTER — Ambulatory Visit (HOSPITAL_COMMUNITY)
Admission: RE | Admit: 2015-11-26 | Discharge: 2015-11-26 | Disposition: A | Discharge status: Home / Self Care | Payer: 59 | Source: Ambulatory Visit | Attending: Oncology | Admitting: Oncology

## 2015-11-26 VITALS — BP 144/80 | HR 85 | Temp 99.5°F | Resp 20

## 2015-11-26 DIAGNOSIS — C50312 Malignant neoplasm of lower-inner quadrant of left female breast: Secondary | ICD-10-CM

## 2015-11-26 DIAGNOSIS — R530 Neoplastic (malignant) related fatigue: Secondary | ICD-10-CM

## 2015-11-26 DIAGNOSIS — C50912 Malignant neoplasm of unspecified site of left female breast: Secondary | ICD-10-CM

## 2015-11-26 MED ORDER — SODIUM CHLORIDE 0.9 % IV SOLN
250.0000 mL | Freq: Once | INTRAVENOUS | Status: AC
  Administered 2015-11-26: 250 mL via INTRAVENOUS

## 2015-11-26 MED ORDER — ACETAMINOPHEN 325 MG PO TABS
650.0000 mg | ORAL_TABLET | Freq: Once | ORAL | Status: AC
  Administered 2015-11-26: 650 mg via ORAL
  Filled 2015-11-26: qty 2

## 2015-11-26 MED ORDER — DIPHENHYDRAMINE HCL 25 MG PO CAPS
25.0000 mg | ORAL_CAPSULE | Freq: Once | ORAL | Status: AC
  Administered 2015-11-26: 25 mg via ORAL
  Filled 2015-11-26: qty 1

## 2015-11-26 MED ORDER — SODIUM CHLORIDE 0.9 % IJ SOLN
10.0000 mL | INTRAMUSCULAR | Status: AC | PRN
  Administered 2015-11-26: 10 mL

## 2015-11-26 MED ORDER — HEPARIN SOD (PORK) LOCK FLUSH 100 UNIT/ML IV SOLN
500.0000 [IU] | Freq: Every day | INTRAVENOUS | Status: AC | PRN
  Administered 2015-11-26: 500 [IU]
  Filled 2015-11-26: qty 5

## 2015-11-26 NOTE — Progress Notes (Signed)
Diagnosis Association: Cancer of lower-inner quadrant of left female breast ([place]) (174.3  MD: [name]  Procedure: Pt's porta cath was accessed and 1 pack of platelets and 2 units of blood given  Condition during procedure: Pt tolerated well  Condition post procedure: Pt alert,oriented and ambulatory

## 2015-11-27 LAB — TYPE AND SCREEN
ABO/RH(D): A POS
Antibody Screen: NEGATIVE
UNIT DIVISION: 0
Unit division: 0

## 2015-11-27 LAB — PREPARE PLATELET PHERESIS: UNIT DIVISION: 0

## 2015-12-04 ENCOUNTER — Telehealth: Payer: Self-pay | Admitting: Oncology

## 2015-12-04 ENCOUNTER — Ambulatory Visit (HOSPITAL_BASED_OUTPATIENT_CLINIC_OR_DEPARTMENT_OTHER): Payer: 59 | Admitting: Oncology

## 2015-12-04 ENCOUNTER — Other Ambulatory Visit: Payer: Self-pay

## 2015-12-04 ENCOUNTER — Ambulatory Visit (HOSPITAL_BASED_OUTPATIENT_CLINIC_OR_DEPARTMENT_OTHER): Payer: 59

## 2015-12-04 ENCOUNTER — Other Ambulatory Visit (HOSPITAL_BASED_OUTPATIENT_CLINIC_OR_DEPARTMENT_OTHER): Payer: 59

## 2015-12-04 VITALS — BP 145/90 | HR 81 | Temp 99.0°F | Resp 24

## 2015-12-04 VITALS — BP 132/77 | HR 100 | Temp 98.8°F | Resp 18 | Ht 63.0 in | Wt 178.2 lb

## 2015-12-04 DIAGNOSIS — C787 Secondary malignant neoplasm of liver and intrahepatic bile duct: Secondary | ICD-10-CM

## 2015-12-04 DIAGNOSIS — D696 Thrombocytopenia, unspecified: Secondary | ICD-10-CM

## 2015-12-04 DIAGNOSIS — C50912 Malignant neoplasm of unspecified site of left female breast: Secondary | ICD-10-CM | POA: Diagnosis not present

## 2015-12-04 DIAGNOSIS — Z95828 Presence of other vascular implants and grafts: Secondary | ICD-10-CM

## 2015-12-04 DIAGNOSIS — Z8673 Personal history of transient ischemic attack (TIA), and cerebral infarction without residual deficits: Secondary | ICD-10-CM

## 2015-12-04 DIAGNOSIS — C50919 Malignant neoplasm of unspecified site of unspecified female breast: Secondary | ICD-10-CM

## 2015-12-04 DIAGNOSIS — C50312 Malignant neoplasm of lower-inner quadrant of left female breast: Secondary | ICD-10-CM

## 2015-12-04 LAB — CBC WITH DIFFERENTIAL/PLATELET
BASO%: 1.1 % (ref 0.0–2.0)
BASOS ABS: 0 10*3/uL (ref 0.0–0.1)
EOS%: 1.1 % (ref 0.0–7.0)
Eosinophils Absolute: 0 10*3/uL (ref 0.0–0.5)
HEMATOCRIT: 28.9 % — AB (ref 34.8–46.6)
HEMOGLOBIN: 9.3 g/dL — AB (ref 11.6–15.9)
LYMPH#: 0.3 10*3/uL — AB (ref 0.9–3.3)
LYMPH%: 17.3 % (ref 14.0–49.7)
MCH: 33.3 pg (ref 25.1–34.0)
MCHC: 32.2 g/dL (ref 31.5–36.0)
MCV: 103.6 fL — AB (ref 79.5–101.0)
MONO#: 0.1 10*3/uL (ref 0.1–0.9)
MONO%: 2.7 % (ref 0.0–14.0)
NEUT#: 1.4 10*3/uL — ABNORMAL LOW (ref 1.5–6.5)
NEUT%: 77.8 % — AB (ref 38.4–76.8)
Platelets: 16 10*3/uL — ABNORMAL LOW (ref 145–400)
RBC: 2.79 10*6/uL — ABNORMAL LOW (ref 3.70–5.45)
RDW: 22.9 % — ABNORMAL HIGH (ref 11.2–14.5)
WBC: 1.9 10*3/uL — ABNORMAL LOW (ref 3.9–10.3)
nRBC: 0 % (ref 0–0)

## 2015-12-04 LAB — COMPREHENSIVE METABOLIC PANEL
ALBUMIN: 2.7 g/dL — AB (ref 3.5–5.0)
ALK PHOS: 155 U/L — AB (ref 40–150)
ALT: 24 U/L (ref 0–55)
AST: 32 U/L (ref 5–34)
Anion Gap: 7 mEq/L (ref 3–11)
BUN: 16.8 mg/dL (ref 7.0–26.0)
CALCIUM: 8.3 mg/dL — AB (ref 8.4–10.4)
CO2: 26 mEq/L (ref 22–29)
Chloride: 107 mEq/L (ref 98–109)
Creatinine: 0.7 mg/dL (ref 0.6–1.1)
Glucose: 111 mg/dl (ref 70–140)
POTASSIUM: 4 meq/L (ref 3.5–5.1)
Sodium: 140 mEq/L (ref 136–145)
Total Bilirubin: 1.32 mg/dL — ABNORMAL HIGH (ref 0.20–1.20)
Total Protein: 4.7 g/dL — ABNORMAL LOW (ref 6.4–8.3)

## 2015-12-04 LAB — PREPARE RBC (CROSSMATCH)

## 2015-12-04 LAB — HOLD TUBE, BLOOD BANK

## 2015-12-04 MED ORDER — ACETAMINOPHEN 325 MG PO TABS
ORAL_TABLET | ORAL | Status: AC
  Filled 2015-12-04: qty 2

## 2015-12-04 MED ORDER — SODIUM CHLORIDE 0.9 % IV SOLN
250.0000 mL | Freq: Once | INTRAVENOUS | Status: AC
  Administered 2015-12-04: 250 mL via INTRAVENOUS

## 2015-12-04 MED ORDER — SODIUM CHLORIDE 0.9 % IJ SOLN
10.0000 mL | INTRAMUSCULAR | Status: DC | PRN
  Filled 2015-12-04: qty 10

## 2015-12-04 MED ORDER — HEPARIN SOD (PORK) LOCK FLUSH 100 UNIT/ML IV SOLN
500.0000 [IU] | Freq: Every day | INTRAVENOUS | Status: DC | PRN
  Filled 2015-12-04: qty 5

## 2015-12-04 MED ORDER — ACETAMINOPHEN 325 MG PO TABS
650.0000 mg | ORAL_TABLET | Freq: Once | ORAL | Status: AC
  Administered 2015-12-04: 650 mg via ORAL

## 2015-12-04 MED ORDER — SODIUM CHLORIDE 0.9 % IJ SOLN
10.0000 mL | INTRAMUSCULAR | Status: DC | PRN
  Administered 2015-12-04: 10 mL via INTRAVENOUS
  Filled 2015-12-04: qty 10

## 2015-12-04 MED ORDER — DIPHENHYDRAMINE HCL 25 MG PO CAPS
ORAL_CAPSULE | ORAL | Status: AC
  Filled 2015-12-04: qty 2

## 2015-12-04 MED ORDER — DIPHENHYDRAMINE HCL 25 MG PO CAPS
25.0000 mg | ORAL_CAPSULE | Freq: Once | ORAL | Status: AC
  Administered 2015-12-04: 25 mg via ORAL

## 2015-12-04 NOTE — Telephone Encounter (Signed)
Flush appointments added per pof °

## 2015-12-04 NOTE — Patient Instructions (Signed)

## 2015-12-04 NOTE — Progress Notes (Signed)
ID: Kristin Hoffman OB: Jan 16, 1966  MR#: [id number]  FMB#:[id number]  PCP: Drake Leach, MD GYN:  Kristin Hoffman SU: Kristin Hoffman OTHER MD: Kristin Hoffman, Kristin Hoffman  CHIEF COMPLAINT:  Stage IV breast cancer  CURRENT TREATMENT: Liposomal doxorubicin  BREAST CANCER HISTORY: This patient was previously followed by Kristin Hoffman, and was transferred to Kristin Hoffman service as of 08/20/2013.  At the age of 49, the patient had a screening mammogram as a baseline. She had recently given birth and was on birth control pills at that time. The mammogram in November 2006 showed an area of architectural distortion in the left lower inner quadrant. Subsequently an ultrasound was obtained of the left breast showing a 2.0 x 1.5 x 1.4 cm mass, at the 8:00 position, 4 cm from the nipple. A core biopsy performed on 10/21/2005 showed an invasive mammary carcinoma, ER +70%, PR +41%, HER-2/neu negative, with proliferation marker of 38%. [phone])  Breast MRI on 11/08/2005 confirmed a 2.5 cm spiculated mass in the left lower inner quadrant with 3 small satellite nodules adjacent to the primary mass: 3.5 cm posterior medial to the primary mass, measuring 9 mm; 1.5 cm anterior medial to the primary mass measuring 5 mm; and 2 cm medial to the primary mass measuring 5 mm. No axillary adenopathy was noted. No suspicious masses or enhancement are noted in the right breast.  Kristin Hoffman underwentt left lumpectomy under the care of Kristin Hoffman on 11/18/2005 for a 2.5 cm grade 3 invasive ductal carcinoma, ER +70%, PR +41%, HER-2/neu negative, with proliferation marker of 38%. 2 of 23 lymph nodes were involved.  Margins were clear.  She received adjuvant chemotherapy consisting of 6 q. three-week doses of docetaxel/doxorubicin/cyclophosphamide given between January 2007 and may of 2007, with last dose on 04/20/2006. She underwent radiation therapy completed 07/11/2006, after which she began on tamoxifen in early August  2007.  She underwent hysterectomy with bilateral salpingo-oophorectomy on 07/15/2008, and was started on letrozole in October of 2009.  A mammogram 11/10/2009 showed microcalcifications in the left breast, and a subsequent biopsy on 11/18/2009 confirmed invasive mammary carcinoma in the left breast. PET and CT of the chest showed no evidence of metastasis in January 2011.  She underwent bilateral mastectomies 01/25/2010, with the right breast showing no evidence of malignancy; in the left breast showing a 1.0 cm grade 2 invasive ductal carcinoma with high-grade DCIS. Tumor was ER +22%, PR negative, HER-2/neu negative, with proliferation marker of 13%. Margins were clear. Patient underwent concurrent left latissimus flap reconstruction and right implant reconstruction.  She received additional chemotherapy with one cycle of carboplatin/gemcitabine given on 03/11/2010. She then received 5 q. three-week cycles of IV CMF between 03/25/2010 and 06/17/2010.  She was started on exemestane in January 2012 and continued until late November 2013 when she was hospitalized for apparent TIA.   Her subsequent history is as detailed below  INTERVAL HISTORY: Kristin Hoffman returns today for follow up of her stage IV, triple negative breast cancer accompanied by her husband Kristin Hoffman. Today is day 12 cycle 1 of 4 planned cycles of liposomal doxorubicin, to be given 3 weeks apart as tolerated. [name] tolerated the Doxil well. In fact that she really had side effects from was the onpro. This knocks her out for a couple of days when she gets very sleepy and achy. After that she was much more active that she has been able to be for several months. She told herself she was going to have a couple  coffee in the morning and after that not get back into bed until he was nighttime. She was able to do that. She did sit at this so far for some time when she felt tired. This is however an enormous improvement over her being back in bed for half the  day as she was previously.  REVIEW OF SYSTEMS: Kristin Hoffman still has significant back pain. She takes tramadol and ibuprofen for this 3 times a day and that controls it well. It is the only place where she hurts. She still has some blurred vision problems that times. She has a runny nose and occasionally minimal epistaxis. She has developed a little bit of a sore throat. She thought she had thrush, and just in case took some Diflucan. She did not use Magic mouthwash. She did feel a little hoarse. She short of breath only when she walks up stairs. Her appetite is poor by mouth she has heartburn. Her stools are very variable, occasionally a little hard, most of the time of little loose. She bruises easily. She is forgetful particularly after chemotherapy. Otherwise a detailed review of systems today was entirely stable   PAST MEDICaL HISTORY: Past Medical History  Diagnosis Date  . Depression   . Reflux   . Hx-TIA (transient ischemic attack) 11/22/2013  . Chronic headaches 11/22/2013  . Anxiety 11/22/2013  . Breast cancer (Kristin Hoffman)   . Cancer (Kristin Hoffman)   . Breast cancer metastasized to multiple sites [facility]) 12/24/2013    PAST SURGICAL HISTORY: Past Surgical History  Procedure Laterality Date  . Mastectomy w/ nodes partial    . Mastectomy    . Breast reconstruction    . Portacath placement      x 2  . Portacath removal      x 2  . Abdominal hysterectomy    . Tee without cardioversion  11/12/2012    Procedure: TRANSESOPHAGEAL ECHOCARDIOGRAM (TEE);  Surgeon: Kristin Furbish, MD;  Location: [facility] ENDOSCOPY;  Service: Cardiovascular;  Laterality: N/A;  This TEE may be Dr. Marlou Porch or a LHC Dr    FAMILY HISTORY Both parents are alive and well. Patient has one sister who is 2 years younger and is in good health. No other history of breast or ovarian cancer in the family. Family History  Problem Relation Age of Onset  . Diabetes Mellitus II Neg Hx   . Hypertension Neg Hx     GYNECOLOGIC HISTORY:   (Updated  January 2015) G2P2, menarche at age 12 with irregular menses. On birth control pills in the past. On Clomid to induce ovulation with first pregnancy. Also had preeclampsia with first pregnancy. Status post hysterectomy and bilateral salpingo-oophorectomy in August 2009.  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 She is originally from [place]. She's been married to [place], for 13 years. He works as an Pharmacist, hospital at [employer].   ADVANCED DIRECTIVES:  Not in place. At the clinic visit 12/04/2015 the patient was given the appropriate forms to complete and notarize at her discretion.    HEALTH MAINTENANCE:  (Updated 11/22/2013) Social History  Substance Use Topics  . Smoking status: Never Smoker   . Smokeless tobacco: Never Used  . Alcohol Use: Yes     Comment: rarely     Colonoscopy:  Never  PAP: S/P LAVH/BSO in August 2009  Bone density: Never  Lipid panel: Not on file   Allergies  Allergen Reactions  . Afinitor [Everolimus] Hives and Itching    Current Outpatient Prescriptions  Medication Sig Dispense Refill  . acidophilus (RISAQUAD) CAPS capsule Take 1 capsule by mouth every morning.     . Biotin 5000 MCG CAPS Take 5,000 mcg by mouth every morning.  30 capsule   . cetirizine (ZYRTEC) 10 MG tablet Take 10 mg by mouth at bedtime.     . Cholecalciferol 2000 UNITS CHEW Chew 1 tablet by mouth daily with breakfast.     . diphenoxylate-atropine (LOMOTIL) 2.5-0.025 MG tablet TAKE TWO TABLETS 4 TIMES A DAY AS NEEDED 30 tablet 0  . escitalopram (LEXAPRO) 20 MG tablet Take 1 tablet (20 mg total) by mouth daily. 30 tablet 12  . Ibuprofen (ADVIL) 200 MG CAPS Take 2 capsules (400 mg total) by mouth 3 (three) times daily with meals as needed. 120 each 0  . LORazepam (ATIVAN) 0.5 MG tablet Take 1 tablet (0.5 mg total) by mouth every 6 (six) hours as needed for anxiety. 120 tablet 1  .  Melatonin 10 MG TABS Take 1 tablet by mouth at bedtime.    . montelukast (SINGULAIR) 10 MG tablet Take 10 mg by mouth daily with breakfast.     . Multiple Vitamin (MULTIVITAMIN) tablet Take 1 tablet by mouth every morning.     . nystatin (MYCOSTATIN) 100000 UNIT/ML suspension Take 5 mLs (500,000 Units total) by mouth 4 (four) times daily. 240 mL 1  . omeprazole (PRILOSEC) 40 MG capsule Take 40 mg by mouth every morning.     . ondansetron (ZOFRAN) 8 MG tablet TAKE 1 TABLET BY MOUTH 2 TIMES DAILY. START DAY AFTER CHEMO FOR 2 DAYS. THEN TAKE AS NEEDED NAUSEA 30 tablet 1  . prochlorperazine (COMPAZINE) 10 MG tablet Take 10 mg by mouth every 6 (six) hours as needed for nausea or vomiting.     . traMADol (ULTRAM) 50 MG tablet Take 1 tablet (50 mg total) by mouth every 6 (six) hours as needed. 120 tablet 1  . valACYclovir (VALTREX) 1000 MG tablet Take 1 tablet (1,000 mg total) by mouth daily. 60 tablet 4   No current facility-administered medications for this visit.    OBJECTIVE: Young white woman who appears stated age 49 Vitals:   12/04/15 1104  BP: 132/77  Pulse: 100  Temp: 98.8 F (37.1 C)  Resp: 18  Body mass index is 31.57 kg/(m^2).  ECOG: 1 Filed Weights   12/04/15 1104  Weight: 178 lb 3.2 oz (80.831 kg)   Sclerae unicteric, pupils round and equal Oropharynx shows minimal erythema in the upper portion of the soft palate bilaterally, but no ulceration and no evidence of thrush No cervical or supraclavicular adenopathy Lungs show a wheeze in the upper right, but no rales or rhonchi Heart regular rate and rhythm Abd soft, nontender, positive bowel sounds MSK no focal spinal tenderness, no upper extremity lymphedema Neuro: nonfocal, well oriented, appropriate affect Breasts: Deferred   LAB RESULTS:  CBC Latest Ref Rng 12/04/2015 11/24/2015 11/17/2015  WBC 3.9 - 10.3 10e3/uL 1.9(L) 3.4(L) 4.5  Hemoglobin 11.6 - 15.9 g/dL 9.3(L) 7.8(L) 7.7(L)  Hematocrit 34.8 - 46.6 % 28.9(L)  24.4(L) 22.8(L)  Platelets 145 - 400 10e3/uL 16(L) 32(L) 6(LL)   CMP Latest Ref Rng 12/04/2015 11/24/2015 11/17/2015  Glucose 70 - 140 mg/dl 111 100 94  BUN 7.0 - 26.0 mg/dL 16.8 8.1 14.4  Creatinine 0.6 - 1.1 mg/dL 0.7 0.7 0.6  Sodium 136 - 145 mEq/L 140 140 140  Potassium  3.5 - 5.1 mEq/L 4.0 3.6 4.0  Chloride 101 - 111 mmol/L - - -  CO2 22 - 29 mEq/L _0 Calcium 8.4 - 10.4 mg/dL 8.3(L) 8.3(L) 8.4  Total Protein 6.4 - 8.3 g/dL 4.7(L) 4.8(L) 4.8(L)  Total Bilirubin 0.20 - 1.20 mg/dL 1.32(H) 0.88 1.14  Alkaline Phos 40 - 150 U/L 155(H) 154(H) 168(H)  AST 5 - 34 U/L 32 28 28  ALT 0 - 55 U/L _1 STUDIES: Ct Chest W Contrast  11/23/2015  CLINICAL DATA:  Recurrent left breast cancer, status post bilateral mastectomy, chemotherapy in progress, XRT complete. Status post hysterectomy. Shortness of breath. Diarrhea. Chest discomfort x1 month. EXAM: CT CHEST, ABDOMEN, AND PELVIS WITH CONTRAST TECHNIQUE: Multidetector CT imaging of the chest, abdomen and pelvis was performed following the standard protocol during bolus administration of intravenous contrast. CONTRAST:  115m OMNIPAQUE IOHEXOL 300 MG/ML  SOLN COMPARISON:  [id number]: CT CHEST FINDINGS Mediastinum/Nodes: The heart is normal in size. No pericardial effusion. Aberrant right subclavian artery. Right chest port terminates in the upper right atrium. Improving thoracic nodal metastases, including a --12 mm short axis left supraclavicular node (series 2/ image 9), previously 14 mm --9 mm short axis high right paratracheal node (series 2/image 15), previously 12 mm --10 mm short axis left prevascular node (series 2/image 19), previously 15 mm Additional small calcified mediastinal nodes, benign. Status post left axillary lymph node dissection. Visualized thyroid is unremarkable. Lungs/Pleura: 1.3 x 1.3 cm necrotic pleural-based nodule along the anterior right middle lobe (series 2/image 31), previously 1.5 x 1.5 cm. Small right  pleural effusion along the major fissure and posterior lung base, mildly improved. 7.1 x 2.1 cm necrotic mass along the anterior left upper chest/chest wall (series 2/image 21), previously 8.1 x 2.8 cm. Associated small left pleural effusion, some of which remains loculated anteriorly along the left chest wall mass. Again noted is interlobular septal thickening with scattered pulmonary nodularity, worrisome for lymphangitic spread of tumor. 7 mm nodule in the anterior left upper lobe (series 4/image 16), previously 9 mm. Additional 8 x 5 mm nodule in the anterior left upper lobe (series 4/image 13), previously 9 x 4 mm, unchanged. Additional scattered discrete nodularity is not well visualized on the current study. No pneumothorax. Musculoskeletal: Status post bilateral mastectomy with reconstruction. Mild thoracic dextroscoliosis with degenerative changes. No focal osseous lesions. CT ABDOMEN PELVIS FINDINGS Hepatobiliary: Although difficult to discretely measure, the suspected metastasis along the anterior right hepatic dome measures approximately 3.9 x 3.6 cm on the current study (series 2/ image 44), previously 4.5 x 4.9 cm. Additional 10 x 10 mm lesion along the anterior caudate (series 2/ image 55), previously 1.6 x 1.3 cm. Additional scattered areas of heterogeneous perfusion in the liver. Again noted is mild gallbladder wall thickening. No intrahepatic or extrahepatic ductal dilatation. Pancreas: 2.5 x 1.7 cm hypoenhancing lesion along the pancreatic head (series 2/ image 59), previously 2.7 x 1.8 cm. Associated atrophy the pancreatic body/ tail. No pancreatic ductal dilatation. Spleen: Within normal limits. Adrenals/Urinary Tract: Adrenal glands are within normal limits. Kidneys are within normal limits.  No hydronephrosis. Bladder is mildly thick-walled although underdistended. Stomach/Bowel: Stomach is within normal limits. No evidence of bowel obstruction. Normal appendix (series 2/image 87).  Vascular/Lymphatic: No evidence of abdominal aortic aneurysm. Circumaortic left renal vein. Again noted is mild soft tissue stranding adjacent to the caudate (series 2/image 56), possibly reflecting nodal soft tissue, equivocal. Otherwise, no suspicious abdominopelvic lymphadenopathy. Reproductive:  Status post hysterectomy. No adnexal masses. Other: Small volume abdominopelvic ascites. No gross peritoneal nodularity. Musculoskeletal: Mild lumbar levoscoliosis. No focal osseous lesions. IMPRESSION: Status post bilateral mastectomy with left axillary lymph node dissection. Improving pleural/parenchymal pulmonary metastases with suspected lymphangitic spread, as described above. Improving thoracic lymphadenopathy. Improving hepatic and pancreatic metastases. Stable small volume abdominopelvic ascites. No gross peritoneal disease. Electronically Signed   By: Julian Hy M.D.   On: 11/23/2015 12:42   Ct Abdomen Pelvis W Contrast  11/23/2015  CLINICAL DATA:  Recurrent left breast cancer, status post bilateral mastectomy, chemotherapy in progress, XRT complete. Status post hysterectomy. Shortness of breath. Diarrhea. Chest discomfort x1 month. EXAM: CT CHEST, ABDOMEN, AND PELVIS WITH CONTRAST TECHNIQUE: Multidetector CT imaging of the chest, abdomen and pelvis was performed following the standard protocol during bolus administration of intravenous contrast. CONTRAST:  132m OMNIPAQUE IOHEXOL 300 MG/ML  SOLN COMPARISON:  [id number]: CT CHEST FINDINGS Mediastinum/Nodes: The heart is normal in size. No pericardial effusion. Aberrant right subclavian artery. Right chest port terminates in the upper right atrium. Improving thoracic nodal metastases, including a --12 mm short axis left supraclavicular node (series 2/ image 9), previously 14 mm --9 mm short axis high right paratracheal node (series 2/image 15), previously 12 mm --10 mm short axis left prevascular node (series 2/image 19), previously 15 mm  Additional small calcified mediastinal nodes, benign. Status post left axillary lymph node dissection. Visualized thyroid is unremarkable. Lungs/Pleura: 1.3 x 1.3 cm necrotic pleural-based nodule along the anterior right middle lobe (series 2/image 31), previously 1.5 x 1.5 cm. Small right pleural effusion along the major fissure and posterior lung base, mildly improved. 7.1 x 2.1 cm necrotic mass along the anterior left upper chest/chest wall (series 2/image 21), previously 8.1 x 2.8 cm. Associated small left pleural effusion, some of which remains loculated anteriorly along the left chest wall mass. Again noted is interlobular septal thickening with scattered pulmonary nodularity, worrisome for lymphangitic spread of tumor. 7 mm nodule in the anterior left upper lobe (series 4/image 16), previously 9 mm. Additional 8 x 5 mm nodule in the anterior left upper lobe (series 4/image 13), previously 9 x 4 mm, unchanged. Additional scattered discrete nodularity is not well visualized on the current study. No pneumothorax. Musculoskeletal: Status post bilateral mastectomy with reconstruction. Mild thoracic dextroscoliosis with degenerative changes. No focal osseous lesions. CT ABDOMEN PELVIS FINDINGS Hepatobiliary: Although difficult to discretely measure, the suspected metastasis along the anterior right hepatic dome measures approximately 3.9 x 3.6 cm on the current study (series 2/ image 44), previously 4.5 x 4.9 cm. Additional 10 x 10 mm lesion along the anterior caudate (series 2/ image 55), previously 1.6 x 1.3 cm. Additional scattered areas of heterogeneous perfusion in the liver. Again noted is mild gallbladder wall thickening. No intrahepatic or extrahepatic ductal dilatation. Pancreas: 2.5 x 1.7 cm hypoenhancing lesion along the pancreatic head (series 2/ image 59), previously 2.7 x 1.8 cm. Associated atrophy the pancreatic body/ tail. No pancreatic ductal dilatation. Spleen: Within normal limits.  Adrenals/Urinary Tract: Adrenal glands are within normal limits. Kidneys are within normal limits.  No hydronephrosis. Bladder is mildly thick-walled although underdistended. Stomach/Bowel: Stomach is within normal limits. No evidence of bowel obstruction. Normal appendix (series 2/image 87). Vascular/Lymphatic: No evidence of abdominal aortic aneurysm. Circumaortic left renal vein. Again noted is mild soft tissue stranding adjacent to the caudate (series 2/image 56), possibly reflecting nodal soft tissue, equivocal. Otherwise, no suspicious abdominopelvic lymphadenopathy. Reproductive: Status post hysterectomy. No adnexal masses. Other:  Small volume abdominopelvic ascites. No gross peritoneal nodularity. Musculoskeletal: Mild lumbar levoscoliosis. No focal osseous lesions. IMPRESSION: Status post bilateral mastectomy with left axillary lymph node dissection. Improving pleural/parenchymal pulmonary metastases with suspected lymphangitic spread, as described above. Improving thoracic lymphadenopathy. Improving hepatic and pancreatic metastases. Stable small volume abdominopelvic ascites. No gross peritoneal disease. Electronically Signed   By: Julian Hy M.D.   On: 11/23/2015 12:42   ASSESSMENT: 49 y.o. 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 2 of 23 lymph nodes were involved.  Margins were clear.  (2) status post adjuvant chemotherapy consisting of 6 q. three-week doses of docetaxel/ doxorubicin/ cyclophosphamide given between January 2007 and may of 2007, with last dose on 04/20/2006.  (3) status post radiation therapy to the left breast, completed 07/11/2006  (4) began on tamoxifen in early August 2007 and continued until October 2009. She status post hysterectomy with bilateral salpingo-oophorectomy on 07/15/2008, and was  started on letrozole in October of 2009.  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 PET and CT of the chest showed no evidence of metastasis in January 2011.  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 Margins were clear. Patient underwent concurrent left latissimus flap reconstruction and right implant reconstruction.  (7)  status post additional chemotherapy with one cycle of carboplatin/gemcitabine given on 03/11/2010. She then received 5 q. three-week cycles of IV CMF between 03/25/2010 and 06/17/2010.  (8) started on exemestane in January 2012 and continued until late November 2013 when she was hospitalized for apparent TIA at which time her exemestane was stopped and not resumed  (9) METASTATIC DISEASE: evidence of disease recurence noted on chest CT, liver MRI and PET scan in December 2014, with suspicious lung nodules, lymphadenopathy, and 3 lesions in the right lobe of the liver, but no evidence of bony disease and no evidence of brain involvement by brain MRI September 2014. Biopsy of a left supraclavicular lymph node on 12/11/2013 confirmed metastatic carcinoma, estrogen receptor 45% positive, progesterone receptor 17% positive, with no HER-2 amplification.  (10) fulvestrant started 12/03/2013, discontinued 03/25/2014 with progression  (11) exemestane/ everolimus started 04/04/2014; everolimus held 04/14/2014, with skin rash and mouth soreness; resumed at 5 mg a day as of 04/29/2014, discontinued 05/09/2014 with similar symptoms  (12) continuing exemestane, adding palbociclib 05/26/2014;   (a) palbociclib dose dropped to 75 mg every other day for 21 days  beginning with cycle 4  (b) exemestane and Palbociclib discontinued December 2015 with evidence of progression  (13) [facility] referral placed 04/28/2049 -- re-sent 05/23/2014-- shows Pi3KCA and TP53 mutations  (a) [facility] one test requested 12/10/2014-- confirms [employer] results  (b) did not qualify for capecitabine/ BYL 719 trial because of elevated lipase  (14) started eribulin 12/26/2014, 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   (a) dpse dropped from 2g BID to 1.5g BID starting on 04/26/15  (b) stopped 05/17/2015 (after 4 cycles)  with progression  (16) started carboplatin/ gemcitabine 05/26/2015, given day one and day 8 of each 21 day cycle for a total of 8 cycles, last dose 11/10/2015, stopped because of persistent cytopenias  (a) given her history of severe neutropenia with this chemotherapy we'll do Neupogen days 2, 3 and 4 of each cycle and onpro day 9  (b) carbo dose dropped by 25% w cycle 2 due to very low platelet nadir  (c) CT scans after 3 cycles (07/27/2015) show measurable response  (d) CT scans after 6 cycles (09/28/2015)show continuing response  (e) CT scans post-treatment (11/23/2015) shows continuing response  (17) Left pleural effusion\   (a) s/p L thoracentesis 05/22/2015, 05/29/2015 and 06/05/2015  (b) attempt at pleurx 06/12/2015 cancelled as effusion loculated and scant  (18) symptomatic anemia  (a) Aranesp started 07/28/2015, retic <1% on 10/06/2015  (b) Feraheme given 08/04/2015; ferritin >2K on 10/06/2015, last dose on 11/1  (19) liposomal doxorubicin started 11/24/2015, repeated every 21 days w onpro support  (a) echo 03/23/2015 shows an EF of 55%  PLAN:  Judeen Hammans did generally well with her first dose of liposomal doxorubicin. I don't know if the minimal erythema we are seeing in her  soft palate is going to worsen but we will have to keep an eye on this. I have asked her to use Magic mouthwash in addition to her pain medicine as needed.  She will receive platelets and blood today but I think this will carry her through the holidays without need for other interventions. She is going to see me January 3 for her second dose of liposomal doxorubicin. If we find that she has significant mucositis problems we may need to move this to every 4 weeks.  She has a good understanding of the overall plan. She knows to call for any problems that may develop before her next visit here.  Chauncey Cruel, MD   12/04/2015 11:32 AM

## 2015-12-04 NOTE — Patient Instructions (Signed)
Blood Transfusion  A blood transfusion is a procedure that gives you donated blood through an IV tube. You may need blood because of illness, surgery, or injury. The blood may come from a donor. The blood may also be your own blood that you donated earlier. The blood you get is made up of different types of cells. You may get:   Red blood cells. These carry oxygen and replace lost blood.   Platelets. These control bleeding.   Plasma. This helps blood to clot. If you have a clotting disorder, you may also get other types of blood products.  BEFORE THE PROCEDURE  You may have a blood test. This finds out what type of blood you have. It also finds out what kind of blood your body will accept.   If you are going to have a planned surgery, you may donate your own blood. This is done in case you need to have a transfusion.   If you have had an allergic transfusion reaction before, you may be given medicine to help prevent a reaction. Take this medicine only as told by your doctor.  You will have your temperature, blood pressure, and pulse checked. PROCEDURE   An IV will be started in your hand or arm.   The bag of donated blood will be attached to your IV and run into your vein.   A doctor will regularly check your temperature, blood pressure, and pulse during the procedure. This is done to find any early signs of a transfusion reaction.  If you have any signs or symptoms of a reaction, the procedure may be stopped and you may be given medicine.   When the transfusion is over, your IV will be removed.   Pressure may be applied to the IV site for a few minutes.   A bandage (dressing) will be applied.  The procedure may vary among doctors and hospitals.  AFTER THE PROCEDURE  Your blood pressure, temperature, and pulse will be checked regularly.   This information is not intended to replace advice given to you by your health care provider. Make sure you discuss any questions  you have with your health care provider.   Document Released: 02/24/2009 Document Revised: 12/19/2014 Document Reviewed: 10/08/2014 [employer] Interactive Patient Education 2016 [employer]   Platelet Transfusion  A platelet transfusion is a procedure in which you receive donated platelets through an IV tube. Platelets are tiny pieces of blood cells. When a blood vessel is damaged, platelets collect in the damaged area to help form a blood clot. This begins the healing process. If your platelet count gets too low, your blood may have trouble clotting.  You may need a platelet transfusion if you have a condition that causes a low number of platelets (thrombocytopenia). A platelet transfusion may be used to stop or prevent bleeding.  LET YOUR HEALTH CARE PROVIDER KNOW ABOUT:   Any allergies you have.   All medicines you are taking, including vitamins, herbs, eye drops, creams, and over-the-counter medicines.   Previous problems you or members of your family have had with the use of anesthetics.   Any blood disorders you have.   Previous surgeries you have had.   Any medical conditions you may have.   Any reactions you have had during a previous transfusion. RISKS AND COMPLICATIONS Generally, this is a safe procedure. However, problems may occur, including:   Fever with or without chills. The fever usually occurs within the first 4 hours of the   transfusion and returns to normal within 48 hours.  Allergic reaction. The reaction is most commonly caused by antibodies your body creates against substances in the transfusion. Signs of an allergic reaction may include itching, hives, difficulty breathing, shock, or low blood pressure.  Sudden (acute) or delayed hemolytic reaction. This rare reaction can occur during the transfusion and up to 28 days after the transfusion. The reaction usually occurs when your body's defense system (immune system) attacks the new platelets. Signs of a  hemolytic reaction may include fever, headache, difficulty breathing, low blood pressure, a rapid heartbeat, or pain in your back, abdomen, chest, or IV site.  Transfusion-related acute lung injury (TRALI). TRALI can occur within hours of a transfusion, or several days later. This is a rare reaction that causes lung damage. The cause is not known.  Infection. Signs of this rare complication may include fever, chills, vomiting, a rapid heartbeat, or low blood pressure. BEFORE THE PROCEDURE   You may have a blood test to determine your blood type. This is necessary to find out what kind ofplatelets best matches your platelets.  If you have had an allergic reaction to a transfusion in the past, you may be given medicine to help prevent a reaction. Take this medicine only as directed by your health care provider.  Your temperature, blood pressure, and pulse will be monitored before the transfusion. PROCEDURE  An IV will be started in your hand or arm.  The transfusion will be attached to your IV tubing. The bag of donated platelets will be attached to your IV tube andgiven into your vein.  Your temperature, blood pressure, and pulse will be monitored regularly during the transfusion. This monitoring is done to help detect early signs of a transfusion reaction.  If you have any signs or symptoms of a reaction, your transfusion will be stopped and you may be given medicine.  When your transfusion is complete, your IV will be removed.  Pressure may be applied to the IV site for a few minutes.  A bandage (dressing) will be applied. The procedure may vary among health care providers and hospitals. AFTER THE PROCEDURE  Your blood pressure, temperature, and pulse will be monitored regularly.   This information is not intended to replace advice given to you by your health care provider. Make sure you discuss any questions you have with your health care provider.   Document Released:  09/25/2007 Document Revised: 12/19/2014 Document Reviewed: 10/08/2014 [employer] Interactive Patient Education 2016 [employer]  

## 2015-12-05 LAB — PREPARE PLATELET PHERESIS: UNIT DIVISION: 0

## 2015-12-05 LAB — TYPE AND SCREEN
ABO/RH(D): A POS
Antibody Screen: NEGATIVE
UNIT DIVISION: 0

## 2015-12-15 ENCOUNTER — Ambulatory Visit: Payer: 59

## 2015-12-15 ENCOUNTER — Telehealth: Payer: Self-pay | Admitting: Oncology

## 2015-12-15 ENCOUNTER — Other Ambulatory Visit (HOSPITAL_BASED_OUTPATIENT_CLINIC_OR_DEPARTMENT_OTHER): Payer: 59

## 2015-12-15 ENCOUNTER — Other Ambulatory Visit: Payer: 59

## 2015-12-15 ENCOUNTER — Ambulatory Visit (HOSPITAL_BASED_OUTPATIENT_CLINIC_OR_DEPARTMENT_OTHER): Payer: 59 | Admitting: Oncology

## 2015-12-15 ENCOUNTER — Ambulatory Visit (HOSPITAL_BASED_OUTPATIENT_CLINIC_OR_DEPARTMENT_OTHER): Payer: 59

## 2015-12-15 VITALS — BP 132/89 | HR 89 | Temp 95.0°F | Resp 19 | Ht 63.0 in | Wt 180.2 lb

## 2015-12-15 DIAGNOSIS — C50912 Malignant neoplasm of unspecified site of left female breast: Secondary | ICD-10-CM | POA: Diagnosis not present

## 2015-12-15 DIAGNOSIS — R5383 Other fatigue: Secondary | ICD-10-CM

## 2015-12-15 DIAGNOSIS — C50312 Malignant neoplasm of lower-inner quadrant of left female breast: Secondary | ICD-10-CM

## 2015-12-15 DIAGNOSIS — C50919 Malignant neoplasm of unspecified site of unspecified female breast: Secondary | ICD-10-CM

## 2015-12-15 DIAGNOSIS — Z95828 Presence of other vascular implants and grafts: Secondary | ICD-10-CM

## 2015-12-15 LAB — CBC WITH DIFFERENTIAL/PLATELET
BASO%: 1 % (ref 0.0–2.0)
Basophils Absolute: 0 10e3/uL (ref 0.0–0.1)
EOS%: 0.4 % (ref 0.0–7.0)
Eosinophils Absolute: 0 10e3/uL (ref 0.0–0.5)
HCT: 33.1 % — ABNORMAL LOW (ref 34.8–46.6)
HGB: 10.7 g/dL — ABNORMAL LOW (ref 11.6–15.9)
LYMPH%: 16.5 % (ref 14.0–49.7)
MCH: 33 pg (ref 25.1–34.0)
MCHC: 32.3 g/dL (ref 31.5–36.0)
MCV: 102.1 fL — ABNORMAL HIGH (ref 79.5–101.0)
MONO#: 0.7 10e3/uL (ref 0.1–0.9)
MONO%: 33.1 % — ABNORMAL HIGH (ref 0.0–14.0)
NEUT#: 1 10e3/uL — ABNORMAL LOW (ref 1.5–6.5)
NEUT%: 49 % (ref 38.4–76.8)
Platelets: 60 10e3/uL — ABNORMAL LOW (ref 145–400)
RBC: 3.24 10e6/uL — ABNORMAL LOW (ref 3.70–5.45)
RDW: 27.2 % — ABNORMAL HIGH (ref 11.2–14.5)
WBC: 2.1 10e3/uL — ABNORMAL LOW (ref 3.9–10.3)
lymph#: 0.3 10e3/uL — ABNORMAL LOW (ref 0.9–3.3)

## 2015-12-15 LAB — COMPREHENSIVE METABOLIC PANEL WITH GFR
ALT: 23 U/L (ref 0–55)
AST: 34 U/L (ref 5–34)
Albumin: 2.9 g/dL — ABNORMAL LOW (ref 3.5–5.0)
Alkaline Phosphatase: 140 U/L (ref 40–150)
Anion Gap: 5 meq/L (ref 3–11)
BUN: 8 mg/dL (ref 7.0–26.0)
CO2: 29 meq/L (ref 22–29)
Calcium: 8.2 mg/dL — ABNORMAL LOW (ref 8.4–10.4)
Chloride: 108 meq/L (ref 98–109)
Creatinine: 0.7 mg/dL (ref 0.6–1.1)
EGFR: >90 ml/min/1.73 m2 (ref >90)
Glucose: 102 mg/dL (ref 70–140)
Potassium: 3.9 meq/L (ref 3.5–5.1)
Sodium: 142 meq/L (ref 136–145)
Total Bilirubin: 1.15 mg/dL (ref 0.20–1.20)
Total Protein: 5 g/dL — ABNORMAL LOW (ref 6.4–8.3)

## 2015-12-15 MED ORDER — SODIUM CHLORIDE 0.9 % IJ SOLN
10.0000 mL | INTRAMUSCULAR | Status: DC | PRN
  Administered 2015-12-15: 10 mL via INTRAVENOUS
  Filled 2015-12-15: qty 10

## 2015-12-15 MED ORDER — HEPARIN SOD (PORK) LOCK FLUSH 100 UNIT/ML IV SOLN
500.0000 [IU] | Freq: Once | INTRAVENOUS | Status: AC
  Administered 2015-12-15: 500 [IU] via INTRAVENOUS
  Filled 2015-12-15: qty 5

## 2015-12-15 NOTE — Patient Instructions (Signed)

## 2015-12-15 NOTE — Progress Notes (Signed)
ID: Kristin Hoffman OB: 1966/03/30  MR#: [id number]  EPP#:[id number]  PCP: Drake Leach, MD GYN:  [name] SU: [name] OTHER MD: [name], [name]  CHIEF COMPLAINT:  Stage IV breast cancer  CURRENT TREATMENT: Liposomal doxorubicin  BREAST CANCER HISTORY: This patient was previously followed by [name], and was transferred to [name] service as of 08/20/2013.  At the age of 50, the patient had a screening mammogram as a baseline. She had recently given birth and was on birth control pills at that time. The mammogram in November 2006 showed an area of architectural distortion in the left lower inner quadrant. Subsequently an ultrasound was obtained of the left breast showing a 2.0 x 1.5 x 1.4 cm mass, at the 8:00 position, 4 cm from the nipple. A core biopsy performed on 10/21/2005 showed an invasive mammary carcinoma, ER +70%, PR +41%, HER-2/neu negative, with proliferation marker of 38%. [phone])  Breast MRI on 11/08/2005 confirmed a 2.5 cm spiculated mass in the left lower inner quadrant with 3 small satellite nodules adjacent to the primary mass: 3.5 cm posterior medial to the primary mass, measuring 9 mm; 1.5 cm anterior medial to the primary mass measuring 5 mm; and 2 cm medial to the primary mass measuring 5 mm. No axillary adenopathy was noted. No suspicious masses or enhancement are noted in the right breast.  Ahrianna underwentt left lumpectomy under the care of Dr. 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 2 of 23 lymph nodes were involved.  Margins were clear.  She received adjuvant chemotherapy consisting of 6 q. three-week doses of docetaxel/doxorubicin/cyclophosphamide given between January 2007 and may of 2007, with last dose on 04/20/2006. She underwent radiation therapy completed 07/11/2006, after which she began on tamoxifen in early August  2007.  She underwent hysterectomy with bilateral salpingo-oophorectomy on 07/15/2008, and was started on letrozole in October of 2009.  A mammogram 11/10/2009 showed microcalcifications in the left breast, and a subsequent biopsy on 11/18/2009 confirmed invasive mammary carcinoma in the left breast. PET and CT of the chest showed no evidence of metastasis in January 2011.  She underwent bilateral mastectomies 01/25/2010, with the right breast showing no evidence of malignancy; in the left breast showing a 1.0 cm grade 2 invasive ductal carcinoma with high-grade DCIS. Tumor was ER +22%, PR negative, HER-2/neu negative, with proliferation marker of 13%. Margins were clear. Patient underwent concurrent left latissimus flap reconstruction and right implant reconstruction.  She received additional chemotherapy with one cycle of carboplatin/gemcitabine given on 03/11/2010. She then received 5 q. three-week cycles of IV CMF between 03/25/2010 and 06/17/2010.  She was started on exemestane in January 2012 and continued until late November 2013 when she was hospitalized for apparent TIA.   Her subsequent history is as detailed below  INTERVAL HISTORY: [name] returns today for follow up of her triple negative breast cancer. Today is day 1 cycle 2 of liposomal doxorubicin, but as noted below we will postpone this. She did well with the first cycle except that she had approximately 1 week where she had great difficulty swallowing because of mouth and throat pain. She did not have thrush. She has not developed palmar plantar erythrodysesthesia or any heart symptoms.  REVIEW OF SYSTEMS: Kristin Hoffman Has more energy, and though she stays in bed until 10 or 11 in the morning, she is no longer also taking naps in  the afternoon. She enjoyed the holidays although they were exhausting for her. She is now home schooling her children. The onpro made her tired and foggy she sets. She continues to have pain in her back, blurred vision,  runny nose, shortness of breath particularly when climbing stairs, poor appetite, loose stools, easy bruising, headaches, weakness, anxiety and forgetfulness. A detailed review of systems was otherwise stable.  PAST MEDICaL HISTORY: Past Medical History  Diagnosis Date  . Depression   . Reflux   . Hx-TIA (transient ischemic attack) 11/22/2013  . Chronic headaches 11/22/2013  . Anxiety 11/22/2013  . Breast cancer ([place])   . Cancer ([place])   . Breast cancer metastasized to multiple sites [facility]) 12/24/2013    PAST SURGICAL HISTORY: Past Surgical History  Procedure Laterality Date  . Mastectomy w/ nodes partial    . Mastectomy    . Breast reconstruction    . Portacath placement      x 2  . Portacath removal      x 2  . Abdominal hysterectomy    . Tee without cardioversion  11/12/2012    Procedure: TRANSESOPHAGEAL ECHOCARDIOGRAM (TEE);  Surgeon: Candee Furbish, MD;  Location: [facility] ENDOSCOPY;  Service: Cardiovascular;  Laterality: N/A;  This TEE may be Dr. Marlou Porch or a LHC Dr    FAMILY HISTORY Both parents are alive and well. Patient has one sister who is 48 years younger and is in good health. No other history of breast or ovarian cancer in the family. Family History  Problem Relation Age of Onset  . Diabetes Mellitus II Neg Hx   . Hypertension Neg Hx     GYNECOLOGIC HISTORY:   (Updated January 2015) G2P2, menarche at age 33 with irregular menses. On birth control pills in the past. On Clomid to induce ovulation with first pregnancy. Also had preeclampsia with first pregnancy. Status post hysterectomy and bilateral salpingo-oophorectomy in August 2009.  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 She is originally from [place]. She's been married to Mangum, for 13 years. He works as an Pharmacist, hospital at [employer].   ADVANCED DIRECTIVES:  Not in place. At the clinic visit 12/15/2015 the patient  was given the appropriate forms to complete and notarize at her discretion.    HEALTH MAINTENANCE:  (Updated 11/22/2013) Social History  Substance Use Topics  . Smoking status: Never Smoker   . Smokeless tobacco: Never Used  . Alcohol Use: Yes     Comment: rarely     Colonoscopy:  Never  PAP: S/P LAVH/BSO in August 2009  Bone density: Never  Lipid panel: Not on file   Allergies  Allergen Reactions  . Afinitor [Everolimus] Hives and Itching    Current Outpatient Prescriptions  Medication Sig Dispense Refill  . acidophilus (RISAQUAD) CAPS capsule Take 1 capsule by mouth every morning.     . Biotin 5000 MCG CAPS Take 5,000 mcg by mouth every morning.  30 capsule   . cetirizine (ZYRTEC) 10 MG tablet Take 10 mg by mouth at bedtime.     . Cholecalciferol 2000 UNITS CHEW Chew 1 tablet by mouth daily with breakfast.     . diphenoxylate-atropine (LOMOTIL) 2.5-0.025 MG tablet TAKE TWO TABLETS 4 TIMES A DAY AS NEEDED 30 tablet 0  . escitalopram (LEXAPRO) 20 MG tablet Take 1 tablet (20 mg total) by mouth daily. 30 tablet 12  . Ibuprofen (ADVIL) 200  MG CAPS Take 2 capsules (400 mg total) by mouth 3 (three) times daily with meals as needed. 120 each 0  . LORazepam (ATIVAN) 0.5 MG tablet Take 1 tablet (0.5 mg total) by mouth every 6 (six) hours as needed for anxiety. 120 tablet 1  . Melatonin 10 MG TABS Take 1 tablet by mouth at bedtime.    . montelukast (SINGULAIR) 10 MG tablet Take 10 mg by mouth daily with breakfast.     . Multiple Vitamin (MULTIVITAMIN) tablet Take 1 tablet by mouth every morning.     . nystatin (MYCOSTATIN) 100000 UNIT/ML suspension Take 5 mLs (500,000 Units total) by mouth 4 (four) times daily. 240 mL 1  . omeprazole (PRILOSEC) 40 MG capsule Take 40 mg by mouth every morning.     . ondansetron (ZOFRAN) 8 MG tablet TAKE 1 TABLET BY MOUTH 2 TIMES DAILY. START DAY AFTER CHEMO FOR 2 DAYS. THEN TAKE AS NEEDED NAUSEA 30 tablet 1  . prochlorperazine (COMPAZINE) 10 MG tablet  Take 10 mg by mouth every 6 (six) hours as needed for nausea or vomiting.     . traMADol (ULTRAM) 50 MG tablet Take 1 tablet (50 mg total) by mouth every 6 (six) hours as needed. 120 tablet 1  . valACYclovir (VALTREX) 1000 MG tablet Take 1 tablet (1,000 mg total) by mouth daily. 60 tablet 4   No current facility-administered medications for this visit.   Facility-Administered Medications Ordered in Other Visits  Medication Dose Route Frequency Provider Last Rate Last Dose  . sodium chloride 0.9 % injection 10 mL  10 mL Intravenous PRN Chauncey Cruel, MD   10 mL at 12/15/15 1159    OBJECTIVE: Young white woman In no acute distress  Filed Vitals:   12/15/15 1158  BP: 132/89  Pulse: 89  Temp: 95 F (35 C)  Resp: 19  Body mass index is 31.93 kg/(m^2).  ECOG: 2 Filed Weights   12/15/15 1158  Weight: 180 lb 3.2 oz (81.738 kg)   Sclerae unicteric, EOMs intact Oropharynx clear, dentition in good repair No cervical or supraclavicular adenopathy Lungs no rales or rhonchi Heart regular rate and rhythm Abd soft, nontender, positive bowel sounds, no masses palpated MSK no focal spinal tenderness, no upper extremity lymphedema Neuro: nonfocal, well oriented, appropriate affect Breasts: deferred    LAB RESULTS:  CBC Latest Ref Rng 12/15/2015 12/04/2015 11/24/2015  WBC 3.9 - 10.3 10e3/uL 2.1(L) 1.9(L) 3.4(L)  Hemoglobin 11.6 - 15.9 g/dL 10.7(L) 9.3(L) 7.8(L)  Hematocrit 34.8 - 46.6 % 33.1(L) 28.9(L) 24.4(L)  Platelets 145 - 400 10e3/uL 60(L) 16(L) 32(L)   CMP Latest Ref Rng 12/04/2015 11/24/2015 11/17/2015  Glucose 70 - 140 mg/dl 111 100 94  BUN 7.0 - 26.0 mg/dL 16.8 8.1 14.4  Creatinine 0.6 - 1.1 mg/dL 0.7 0.7 0.6  Sodium 136 - 145 mEq/L 140 140 140  Potassium 3.5 - 5.1 mEq/L 4.0 3.6 4.0  Chloride 101 - 111 mmol/L - - -  CO2 22 - 29 mEq/L _0 Calcium 8.4 - 10.4 mg/dL 8.3(L) 8.3(L) 8.4  Total Protein 6.4 - 8.3 g/dL 4.7(L) 4.8(L) 4.8(L)  Total Bilirubin 0.20 - 1.20 mg/dL  1.32(H) 0.88 1.14  Alkaline Phos 40 - 150 U/L 155(H) 154(H) 168(H)  AST 5 - 34 U/L 32 28 28  ALT 0 - 55 U/L _1 STUDIES: Ct Chest W Contrast  11/23/2015  CLINICAL DATA:  Recurrent left breast cancer, status post bilateral mastectomy, chemotherapy in progress,  XRT complete. Status post hysterectomy. Shortness of breath. Diarrhea. Chest discomfort x1 month. EXAM: CT CHEST, ABDOMEN, AND PELVIS WITH CONTRAST TECHNIQUE: Multidetector CT imaging of the chest, abdomen and pelvis was performed following the standard protocol during bolus administration of intravenous contrast. CONTRAST:  179m OMNIPAQUE IOHEXOL 300 MG/ML  SOLN COMPARISON:  [id number]: CT CHEST FINDINGS Mediastinum/Nodes: The heart is normal in size. No pericardial effusion. Aberrant right subclavian artery. Right chest port terminates in the upper right atrium. Improving thoracic nodal metastases, including a --12 mm short axis left supraclavicular node (series 2/ image 9), previously 14 mm --9 mm short axis high right paratracheal node (series 2/image 15), previously 12 mm --10 mm short axis left prevascular node (series 2/image 19), previously 15 mm Additional small calcified mediastinal nodes, benign. Status post left axillary lymph node dissection. Visualized thyroid is unremarkable. Lungs/Pleura: 1.3 x 1.3 cm necrotic pleural-based nodule along the anterior right middle lobe (series 2/image 31), previously 1.5 x 1.5 cm. Small right pleural effusion along the major fissure and posterior lung base, mildly improved. 7.1 x 2.1 cm necrotic mass along the anterior left upper chest/chest wall (series 2/image 21), previously 8.1 x 2.8 cm. Associated small left pleural effusion, some of which remains loculated anteriorly along the left chest wall mass. Again noted is interlobular septal thickening with scattered pulmonary nodularity, worrisome for lymphangitic spread of tumor. 7 mm nodule in the anterior left upper lobe (series 4/image  16), previously 9 mm. Additional 8 x 5 mm nodule in the anterior left upper lobe (series 4/image 13), previously 9 x 4 mm, unchanged. Additional scattered discrete nodularity is not well visualized on the current study. No pneumothorax. Musculoskeletal: Status post bilateral mastectomy with reconstruction. Mild thoracic dextroscoliosis with degenerative changes. No focal osseous lesions. CT ABDOMEN PELVIS FINDINGS Hepatobiliary: Although difficult to discretely measure, the suspected metastasis along the anterior right hepatic dome measures approximately 3.9 x 3.6 cm on the current study (series 2/ image 44), previously 4.5 x 4.9 cm. Additional 10 x 10 mm lesion along the anterior caudate (series 2/ image 55), previously 1.6 x 1.3 cm. Additional scattered areas of heterogeneous perfusion in the liver. Again noted is mild gallbladder wall thickening. No intrahepatic or extrahepatic ductal dilatation. Pancreas: 2.5 x 1.7 cm hypoenhancing lesion along the pancreatic head (series 2/ image 59), previously 2.7 x 1.8 cm. Associated atrophy the pancreatic body/ tail. No pancreatic ductal dilatation. Spleen: Within normal limits. Adrenals/Urinary Tract: Adrenal glands are within normal limits. Kidneys are within normal limits.  No hydronephrosis. Bladder is mildly thick-walled although underdistended. Stomach/Bowel: Stomach is within normal limits. No evidence of bowel obstruction. Normal appendix (series 2/image 87). Vascular/Lymphatic: No evidence of abdominal aortic aneurysm. Circumaortic left renal vein. Again noted is mild soft tissue stranding adjacent to the caudate (series 2/image 56), possibly reflecting nodal soft tissue, equivocal. Otherwise, no suspicious abdominopelvic lymphadenopathy. Reproductive: Status post hysterectomy. No adnexal masses. Other: Small volume abdominopelvic ascites. No gross peritoneal nodularity. Musculoskeletal: Mild lumbar levoscoliosis. No focal osseous lesions. IMPRESSION: Status post  bilateral mastectomy with left axillary lymph node dissection. Improving pleural/parenchymal pulmonary metastases with suspected lymphangitic spread, as described above. Improving thoracic lymphadenopathy. Improving hepatic and pancreatic metastases. Stable small volume abdominopelvic ascites. No gross peritoneal disease. Electronically Signed   By: SJulian HyM.D.   On: 11/23/2015 12:42   Ct Abdomen Pelvis W Contrast  11/23/2015  CLINICAL DATA:  Recurrent left breast cancer, status post bilateral mastectomy, chemotherapy in progress, XRT complete. Status post hysterectomy. Shortness of  breath. Diarrhea. Chest discomfort x1 month. EXAM: CT CHEST, ABDOMEN, AND PELVIS WITH CONTRAST TECHNIQUE: Multidetector CT imaging of the chest, abdomen and pelvis was performed following the standard protocol during bolus administration of intravenous contrast. CONTRAST:  135m OMNIPAQUE IOHEXOL 300 MG/ML  SOLN COMPARISON:  [id number]: CT CHEST FINDINGS Mediastinum/Nodes: The heart is normal in size. No pericardial effusion. Aberrant right subclavian artery. Right chest port terminates in the upper right atrium. Improving thoracic nodal metastases, including a --12 mm short axis left supraclavicular node (series 2/ image 9), previously 14 mm --9 mm short axis high right paratracheal node (series 2/image 15), previously 12 mm --10 mm short axis left prevascular node (series 2/image 19), previously 15 mm Additional small calcified mediastinal nodes, benign. Status post left axillary lymph node dissection. Visualized thyroid is unremarkable. Lungs/Pleura: 1.3 x 1.3 cm necrotic pleural-based nodule along the anterior right middle lobe (series 2/image 31), previously 1.5 x 1.5 cm. Small right pleural effusion along the major fissure and posterior lung base, mildly improved. 7.1 x 2.1 cm necrotic mass along the anterior left upper chest/chest wall (series 2/image 21), previously 8.1 x 2.8 cm. Associated small left pleural  effusion, some of which remains loculated anteriorly along the left chest wall mass. Again noted is interlobular septal thickening with scattered pulmonary nodularity, worrisome for lymphangitic spread of tumor. 7 mm nodule in the anterior left upper lobe (series 4/image 16), previously 9 mm. Additional 8 x 5 mm nodule in the anterior left upper lobe (series 4/image 13), previously 9 x 4 mm, unchanged. Additional scattered discrete nodularity is not well visualized on the current study. No pneumothorax. Musculoskeletal: Status post bilateral mastectomy with reconstruction. Mild thoracic dextroscoliosis with degenerative changes. No focal osseous lesions. CT ABDOMEN PELVIS FINDINGS Hepatobiliary: Although difficult to discretely measure, the suspected metastasis along the anterior right hepatic dome measures approximately 3.9 x 3.6 cm on the current study (series 2/ image 44), previously 4.5 x 4.9 cm. Additional 10 x 10 mm lesion along the anterior caudate (series 2/ image 55), previously 1.6 x 1.3 cm. Additional scattered areas of heterogeneous perfusion in the liver. Again noted is mild gallbladder wall thickening. No intrahepatic or extrahepatic ductal dilatation. Pancreas: 2.5 x 1.7 cm hypoenhancing lesion along the pancreatic head (series 2/ image 59), previously 2.7 x 1.8 cm. Associated atrophy the pancreatic body/ tail. No pancreatic ductal dilatation. Spleen: Within normal limits. Adrenals/Urinary Tract: Adrenal glands are within normal limits. Kidneys are within normal limits.  No hydronephrosis. Bladder is mildly thick-walled although underdistended. Stomach/Bowel: Stomach is within normal limits. No evidence of bowel obstruction. Normal appendix (series 2/image 87). Vascular/Lymphatic: No evidence of abdominal aortic aneurysm. Circumaortic left renal vein. Again noted is mild soft tissue stranding adjacent to the caudate (series 2/image 56), possibly reflecting nodal soft tissue, equivocal. Otherwise, no  suspicious abdominopelvic lymphadenopathy. Reproductive: Status post hysterectomy. No adnexal masses. Other: Small volume abdominopelvic ascites. No gross peritoneal nodularity. Musculoskeletal: Mild lumbar levoscoliosis. No focal osseous lesions. IMPRESSION: Status post bilateral mastectomy with left axillary lymph node dissection. Improving pleural/parenchymal pulmonary metastases with suspected lymphangitic spread, as described above. Improving thoracic lymphadenopathy. Improving hepatic and pancreatic metastases. Stable small volume abdominopelvic ascites. No gross peritoneal disease. Electronically Signed   By: SJulian HyM.D.   On: 11/23/2015 12:42   ASSESSMENT: 50y.o. [place] woman  (1)  status post left lumpectomy under the care of Dr. SMargot Chimeson 11/18/2005 for a 2.5 cm grade 3 invasive ductal carcinoma, ER +70%, PR +41%, HER-2/neu negative, with  proliferation marker of 38%. 2 of 23 lymph nodes were involved.  Margins were clear.  (2) status post adjuvant chemotherapy consisting of 6 q. three-week doses of docetaxel/ doxorubicin/ cyclophosphamide given between January 2007 and may of 2007, with last dose on 04/20/2006.  (3) status post radiation therapy to the left breast, completed 07/11/2006  (4) began on tamoxifen in early August 2007 and continued until October 2009. She status post hysterectomy with bilateral salpingo-oophorectomy on 07/15/2008, and was started on letrozole in October of 2009.  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 PET and CT of the chest showed no evidence of metastasis in January 2011.  (6) status post bilateral mastectomies 01/25/2010, with the right breast showing no evidence of malignancy; in the left breast showing a 1.0 cm grade 2 invasive ductal carcinoma,  ER +22%, PR negative, HER-2/neu negative, with proliferation marker of  13%. Margins were clear. Patient underwent concurrent left latissimus flap reconstruction and right implant reconstruction.  (7)  status post additional chemotherapy with one cycle of carboplatin/gemcitabine given on 03/11/2010. She then received 5 q. three-week cycles of IV CMF between 03/25/2010 and 06/17/2010.  (8) started on exemestane in January 2012 and continued until late November 2013 when she was hospitalized for apparent TIA at which time her exemestane was stopped and not resumed  (9) METASTATIC DISEASE: evidence of disease recurence noted on chest CT, liver MRI and PET scan in December 2014, with suspicious lung nodules, lymphadenopathy, and 3 lesions in the right lobe of the liver, but no evidence of bony disease and no evidence of brain involvement by brain MRI September 2014. Biopsy of a left supraclavicular lymph node on 12/11/2013 confirmed metastatic carcinoma, estrogen receptor 45% positive, progesterone receptor 17% positive, with no HER-2 amplification.  (10) fulvestrant started 12/03/2013, discontinued 03/25/2014 with progression  (11) exemestane/ everolimus started 04/04/2014; everolimus held 04/14/2014, with skin rash and mouth soreness; resumed at 5 mg a day as of 04/29/2014, discontinued 05/09/2014 with similar symptoms  (12) continuing exemestane, adding palbociclib 05/26/2014;   (a) palbociclib dose dropped to 75 mg every other day for 21 days beginning with cycle 4  (b) exemestane and Palbociclib discontinued December 2015 with evidence of progression  (13) [facility] referral placed 04/28/2049 -- re-sent 05/23/2014-- shows Pi3KCA and TP53 mutations  (a) [facility] one test requested 12/10/2014-- confirms [employer] results  (b) did not qualify for capecitabine/ BYL 719 trial because of elevated lipase  (14) started eribulin 12/26/2014, stopped 01/23/2015 after 2 cycles because of neuropathy; repeat liver MRI 02/25/2015 shows progression   (15) capecitabine started  03/23/2015, initially 2 weeks 1 week off, then every other week starting with cycle 2.   (a) dpse dropped from 2g BID to 1.5g BID starting on 04/26/15  (b) stopped 05/17/2015 (after 4 cycles) with progression  (16) started carboplatin/ gemcitabine 05/26/2015, given day one and day 8 of each 21 day cycle for a total of 8 cycles, last dose 11/10/2015, stopped because of persistent cytopenias  (a) given her history of severe neutropenia with this chemotherapy we'll do Neupogen days 2, 3 and 4 of each cycle and onpro day 9  (b) carbo dose dropped by 25% w cycle 2 due to very low platelet nadir  (c) CT scans after 3 cycles (07/27/2015) show measurable response  (d) CT scans after 6 cycles (09/28/2015)show continuing response  (e) CT scans post-treatment (11/23/2015) shows continuing response  (17)  Left pleural effusion\   (a) s/p L thoracentesis 05/22/2015, 05/29/2015 and 06/05/2015  (b) attempt at pleurx 06/12/2015 cancelled as effusion loculated and scant  (18) symptomatic anemia  (a) Aranesp started 07/28/2015, retic <1% on 10/06/2015  (b) Feraheme given 08/04/2015; ferritin >2K on 10/06/2015, last dose on 11/1  (19) liposomal doxorubicin started 11/24/2015, repeated every 21 days w onpro support  (a) echo 03/23/2015 shows an EF of 55%  PLAN:  Sherry's counts today are borderline. While we could push on and treat with onpro support, very likely we would have to delay her next cycle in any case and I am concerned that she had approximately 1 week of significant mucositis. I think it would be better to give her another week to fully recover and then proceed with the next cycle of liposomal doxorubicin.  We clarified the fact that the anti-viral medications she is on are not going to be causing her mouth sores, but preventing them. The fact that the mouth sores occurred while on this medication was simple temporal association.  I am encouraged that her hemoglobin and platelet counts are better.  She has more energy overall. I am hopeful we can avoid further problems with mucositis. After 3 or 4 cycles of liposomal doxorubicin we will restage.  Chauncey Cruel, MD   12/15/2015 12:29 PM

## 2015-12-15 NOTE — Addendum Note (Signed)
Addended by: Tyler Aas A on: 12/15/2015 01:18 PM   Modules accepted: Orders, SmartSet

## 2015-12-17 ENCOUNTER — Other Ambulatory Visit: Payer: Self-pay | Admitting: Oncology

## 2015-12-22 ENCOUNTER — Other Ambulatory Visit (HOSPITAL_BASED_OUTPATIENT_CLINIC_OR_DEPARTMENT_OTHER): Payer: 59

## 2015-12-22 ENCOUNTER — Encounter: Payer: Self-pay | Admitting: Nurse Practitioner

## 2015-12-22 ENCOUNTER — Ambulatory Visit (HOSPITAL_BASED_OUTPATIENT_CLINIC_OR_DEPARTMENT_OTHER): Payer: 59 | Admitting: Nurse Practitioner

## 2015-12-22 ENCOUNTER — Other Ambulatory Visit: Payer: 59

## 2015-12-22 ENCOUNTER — Ambulatory Visit (HOSPITAL_BASED_OUTPATIENT_CLINIC_OR_DEPARTMENT_OTHER): Payer: 59

## 2015-12-22 VITALS — BP 123/84 | HR 98 | Temp 98.1°F | Resp 17 | Ht 63.0 in | Wt 179.2 lb

## 2015-12-22 DIAGNOSIS — C50912 Malignant neoplasm of unspecified site of left female breast: Secondary | ICD-10-CM

## 2015-12-22 DIAGNOSIS — Z5189 Encounter for other specified aftercare: Secondary | ICD-10-CM | POA: Diagnosis not present

## 2015-12-22 DIAGNOSIS — C50312 Malignant neoplasm of lower-inner quadrant of left female breast: Secondary | ICD-10-CM | POA: Diagnosis not present

## 2015-12-22 DIAGNOSIS — Z5111 Encounter for antineoplastic chemotherapy: Secondary | ICD-10-CM

## 2015-12-22 DIAGNOSIS — C770 Secondary and unspecified malignant neoplasm of lymph nodes of head, face and neck: Secondary | ICD-10-CM | POA: Diagnosis not present

## 2015-12-22 DIAGNOSIS — C50919 Malignant neoplasm of unspecified site of unspecified female breast: Secondary | ICD-10-CM

## 2015-12-22 DIAGNOSIS — Z95828 Presence of other vascular implants and grafts: Secondary | ICD-10-CM

## 2015-12-22 LAB — CBC WITH DIFFERENTIAL/PLATELET
BASO%: 0.2 % (ref 0.0–2.0)
Basophils Absolute: 0 10*3/uL (ref 0.0–0.1)
EOS%: 0.8 % (ref 0.0–7.0)
Eosinophils Absolute: 0 10*3/uL (ref 0.0–0.5)
HEMATOCRIT: 36.5 % (ref 34.8–46.6)
HEMOGLOBIN: 11.6 g/dL (ref 11.6–15.9)
LYMPH#: 0.4 10*3/uL — AB (ref 0.9–3.3)
LYMPH%: 17.5 % (ref 14.0–49.7)
MCH: 33.2 pg (ref 25.1–34.0)
MCHC: 31.7 g/dL (ref 31.5–36.0)
MCV: 104.6 fL — ABNORMAL HIGH (ref 79.5–101.0)
MONO#: 0.5 10*3/uL (ref 0.1–0.9)
MONO%: 24.5 % — ABNORMAL HIGH (ref 0.0–14.0)
NEUT%: 57 % (ref 38.4–76.8)
NEUTROS ABS: 1.2 10*3/uL — AB (ref 1.5–6.5)
PLATELETS: 67 10*3/uL — AB (ref 145–400)
RBC: 3.49 10*6/uL — ABNORMAL LOW (ref 3.70–5.45)
RDW: 28.5 % — ABNORMAL HIGH (ref 11.2–14.5)
WBC: 2.1 10*3/uL — ABNORMAL LOW (ref 3.9–10.3)

## 2015-12-22 LAB — COMPREHENSIVE METABOLIC PANEL
ALK PHOS: 145 U/L (ref 40–150)
ALT: 19 U/L (ref 0–55)
ANION GAP: 7 meq/L (ref 3–11)
AST: 37 U/L — ABNORMAL HIGH (ref 5–34)
Albumin: 2.9 g/dL — ABNORMAL LOW (ref 3.5–5.0)
BUN: 7.5 mg/dL (ref 7.0–26.0)
CALCIUM: 8.2 mg/dL — AB (ref 8.4–10.4)
CO2: 27 mEq/L (ref 22–29)
Chloride: 108 mEq/L (ref 98–109)
Creatinine: 0.7 mg/dL (ref 0.6–1.1)
EGFR: >90 mL/min/{1.73_m2} (ref >90)
Glucose: 112 mg/dl (ref 70–140)
Potassium: 3.5 mEq/L (ref 3.5–5.1)
Sodium: 142 mEq/L (ref 136–145)
TOTAL PROTEIN: 5.1 g/dL — AB (ref 6.4–8.3)
Total Bilirubin: 1.35 mg/dL — ABNORMAL HIGH (ref 0.20–1.20)

## 2015-12-22 MED ORDER — HEPARIN SOD (PORK) LOCK FLUSH 100 UNIT/ML IV SOLN
500.0000 [IU] | Freq: Once | INTRAVENOUS | Status: DC
  Filled 2015-12-22: qty 5

## 2015-12-22 MED ORDER — SODIUM CHLORIDE 0.9 % IJ SOLN
10.0000 mL | INTRAMUSCULAR | Status: DC | PRN
  Administered 2015-12-22: 10 mL
  Filled 2015-12-22: qty 10

## 2015-12-22 MED ORDER — SODIUM CHLORIDE 0.9 % IJ SOLN
10.0000 mL | INTRAMUSCULAR | Status: DC | PRN
  Administered 2015-12-22: 10 mL via INTRAVENOUS
  Filled 2015-12-22: qty 10

## 2015-12-22 MED ORDER — DIPHENHYDRAMINE HCL 50 MG/ML IJ SOLN
INTRAMUSCULAR | Status: AC
  Filled 2015-12-22: qty 1

## 2015-12-22 MED ORDER — SODIUM CHLORIDE 0.9 % IV SOLN
Freq: Once | INTRAVENOUS | Status: AC
  Administered 2015-12-22: 15:00:00 via INTRAVENOUS
  Filled 2015-12-22: qty 4

## 2015-12-22 MED ORDER — FAMOTIDINE IN NACL 20-0.9 MG/50ML-% IV SOLN
INTRAVENOUS | Status: AC
  Filled 2015-12-22: qty 50

## 2015-12-22 MED ORDER — HEPARIN SOD (PORK) LOCK FLUSH 100 UNIT/ML IV SOLN
500.0000 [IU] | Freq: Once | INTRAVENOUS | Status: AC | PRN
Start: 2015-12-22 — End: 2015-12-22
  Administered 2015-12-22: 500 [IU]
  Filled 2015-12-22: qty 5

## 2015-12-22 MED ORDER — DIPHENHYDRAMINE HCL 50 MG/ML IJ SOLN
25.0000 mg | Freq: Once | INTRAMUSCULAR | Status: AC
  Administered 2015-12-22: 25 mg via INTRAVENOUS

## 2015-12-22 MED ORDER — SODIUM CHLORIDE 0.9 % IV SOLN
Freq: Once | INTRAVENOUS | Status: AC
  Administered 2015-12-22: 14:00:00 via INTRAVENOUS

## 2015-12-22 MED ORDER — FAMOTIDINE IN NACL 20-0.9 MG/50ML-% IV SOLN
20.0000 mg | Freq: Two times a day (BID) | INTRAVENOUS | Status: DC
  Administered 2015-12-22: 20 mg via INTRAVENOUS

## 2015-12-22 MED ORDER — OXYCODONE-ACETAMINOPHEN 5-325 MG PO TABS: 1.0000 | ORAL_TABLET | ORAL | Status: DC | PRN

## 2015-12-22 MED ORDER — PEGFILGRASTIM 6 MG/0.6ML ~~LOC~~ PSKT
6.0000 mg | PREFILLED_SYRINGE | Freq: Once | SUBCUTANEOUS | Status: AC
  Administered 2015-12-22: 6 mg via SUBCUTANEOUS
  Filled 2015-12-22: qty 0.6

## 2015-12-22 MED ORDER — DOXORUBICIN HCL LIPOSOMAL CHEMO INJECTION 2 MG/ML
40.0000 mg/m2 | Freq: Once | INTRAVENOUS | Status: AC
  Administered 2015-12-22: 76 mg via INTRAVENOUS
  Filled 2015-12-22: qty 38

## 2015-12-22 NOTE — Patient Instructions (Addendum)
[facility] Discharge Instructions for Patients Receiving Chemotherapy  Today you received the following chemotherapy agents: Doxil.  To help prevent nausea and vomiting after your treatment, we encourage you to take your nausea medication as directed.  If you develop nausea and vomiting that is not controlled by your nausea medication, call the clinic.   BELOW ARE SYMPTOMS THAT SHOULD BE REPORTED IMMEDIATELY:  *FEVER GREATER THAN 100.5 F  *CHILLS WITH OR WITHOUT FEVER  NAUSEA AND VOMITING THAT IS NOT CONTROLLED WITH YOUR NAUSEA MEDICATION  *UNUSUAL SHORTNESS OF BREATH  *UNUSUAL BRUISING OR BLEEDING  TENDERNESS IN MOUTH AND THROAT WITH OR WITHOUT PRESENCE OF ULCERS  *URINARY PROBLEMS  *BOWEL PROBLEMS  UNUSUAL RASH Items with * indicate a potential emergency and should be followed up as soon as possible.  Feel free to call the clinic you have any questions or concerns. The clinic phone number is [phone].  Please show the CHEMO ALERT CARD at check-in to the Emergency Department and triage nurse.   Doxorubicin Liposomal injection What is this medicine? LIPOSOMAL DOXORUBICIN (LIP oh som al dox oh ROO bi sin) is a chemotherapy drug. This medicine is used to treat many kinds of cancer like Kaposi's sarcoma, multiple myeloma, and ovarian cancer. This medicine may be used for other purposes; ask your health care provider or pharmacist if you have questions. What should I tell my health care provider before I take this medicine? They need to know if you have any of these conditions: -blood disorders -heart disease -infection (especially a virus infection such as chickenpox, cold sores, or herpes) -liver disease -recent or ongoing radiation therapy -an unusual or allergic reaction to doxorubicin, other chemotherapy agents, soybeans, other medicines, foods, dyes, or preservatives -pregnant or trying to get pregnant -breast-feeding How should I use this  medicine? This drug is given as an infusion into a vein. It is administered in a hospital or clinic by a specially trained health care professional. If you have pain, swelling, burning or any unusual feeling around the site of your injection, tell your health care professional right away. Talk to your pediatrician regarding the use of this medicine in children. Special care may be needed. Overdosage: If you think you have taken too much of this medicine contact a poison control center or emergency room at once. NOTE: This medicine is only for you. Do not share this medicine with others. What if I miss a dose? It is important not to miss your dose. Call your doctor or health care professional if you are unable to keep an appointment. What may interact with this medicine? Do not take this medicine with any of the following medications: -zidovudine This medicine may also interact with the following medications: -medicines to increase blood counts like filgrastim, pegfilgrastim, sargramostim -vaccines Talk to your doctor or health care professional before taking any of these medicines: -acetaminophen -aspirin -ibuprofen -ketoprofen -naproxen This list may not describe all possible interactions. Give your health care provider a list of all the medicines, herbs, non-prescription drugs, or dietary supplements you use. Also tell them if you smoke, drink alcohol, or use illegal drugs. Some items may interact with your medicine. What should I watch for while using this medicine? Your condition will be monitored carefully while you are receiving this medicine. You will need important blood work done while you are taking this medicine. This drug may make you feel generally unwell. This is not uncommon, as chemotherapy can affect healthy cells as well as   cancer cells. Report any side effects. Continue your course of treatment even though you feel ill unless your doctor tells you to stop. Your urine may  turn orange-red for a few days after your dose. This is not blood. If your urine is dark or brown, call your doctor. In some cases, you may be given additional medicines to help with side effects. Follow all directions for their use. Call your doctor or health care professional for advice if you get a fever (100.5 degrees F or higher), chills or sore throat, or other symptoms of a cold or flu. Do not treat yourself. This drug decreases your body's ability to fight infections. Try to avoid being around people who are sick. This medicine may increase your risk to bruise or bleed. Call your doctor or health care professional if you notice any unusual bleeding. Be careful brushing and flossing your teeth or using a toothpick because you may get an infection or bleed more easily. If you have any dental work done, tell your dentist you are receiving this medicine. Avoid taking products that contain aspirin, acetaminophen, ibuprofen, naproxen, or ketoprofen unless instructed by your doctor. These medicines may hide a fever. Men and women of childbearing age should use effective birth control methods while using taking this medicine. Do not become pregnant while taking this medicine. There is a potential for serious side effects to an unborn child. Talk to your health care professional or pharmacist for more information. Do not breast-feed an infant while taking this medicine. Talk to your doctor about your risk of cancer. You may be more at risk for certain types of cancers if you take this medicine. What side effects may I notice from receiving this medicine? Side effects that you should report to your doctor or health care professional as soon as possible: -allergic reactions like skin rash, itching or hives, swelling of the face, lips, or tongue -low blood counts - this medicine may decrease the number of white blood cells, red blood cells and platelets. You may be at increased risk for infections and  bleeding. -signs of hand-foot syndrome - tingling or burning, redness, flaking, swelling, small blisters, or small sores on the palms of your hands or the soles of your feet -signs of infection - fever or chills, cough, sore throat, pain or difficulty passing urine -signs of decreased platelets or bleeding - bruising, pinpoint red spots on the skin, black, tarry stools, blood in the urine -signs of decreased red blood cells - unusually weak or tired, fainting spells, lightheadedness -back pain, chills, facial flushing, fever, headache, tightness in the chest or throat during the infusion -breathing problems -chest pain -fast, irregular heartbeat -mouth pain, redness, sores -pain, swelling, redness at site where injected -pain, tingling, numbness in the hands or feet -swelling of ankles, feet, or hands -vomiting Side effects that usually do not require medical attention (report to your doctor or health care professional if they continue or are bothersome): -diarrhea -hair loss -loss of appetite -nail discoloration or damage -nausea -red or watery eyes -red colored urine -stomach upset This list may not describe all possible side effects. Call your doctor for medical advice about side effects. You may report side effects to FDA at 1-800-FDA-1088. Where should I keep my medicine? This drug is given in a hospital or clinic and will not be stored at home. NOTE: This sheet is a summary. It may not cover all possible information. If you have questions about this medicine, talk to your   doctor, pharmacist, or health care provider.    2016, [employer]. (2012-08-17 10:12:56)  

## 2015-12-22 NOTE — Progress Notes (Signed)
ID: Kristin Hoffman OB: 02/02/1966  MR#: [id number]  LOV#:[id number]  PCP: Kristin Leach, MD GYN:  Kristin Hoffman SU: Kristin Hoffman OTHER MD: Kristin Hoffman, Kristin Hoffman  CHIEF COMPLAINT:  Stage IV breast cancer  CURRENT TREATMENT: Liposomal doxorubicin  BREAST CANCER HISTORY: This patient was previously followed by Kristin Hoffman, and was transferred to Kristin Hoffman service as of 08/20/2013.  At the age of 50, the patient had a screening mammogram as a baseline. She had recently given birth and was on birth control pills at that time. The mammogram in November 2006 showed an area of architectural distortion in the left lower inner quadrant. Subsequently an ultrasound was obtained of the left breast showing a 2.0 x 1.5 x 1.4 cm mass, at the 8:00 position, 4 cm from the nipple. A core biopsy performed on 10/21/2005 showed an invasive mammary carcinoma, ER +70%, PR +41%, HER-2/neu negative, with proliferation marker of 38%. [phone])  Breast MRI on 11/08/2005 confirmed a 2.5 cm spiculated mass in the left lower inner quadrant with 3 small satellite nodules adjacent to the primary mass: 3.5 cm posterior medial to the primary mass, measuring 9 mm; 1.5 cm anterior medial to the primary mass measuring 5 mm; and 2 cm medial to the primary mass measuring 5 mm. No axillary adenopathy was noted. No suspicious masses or enhancement are noted in the right breast.  Kristin Hoffman underwentt left lumpectomy under the care of Kristin Hoffman on 11/18/2005 for a 2.5 cm grade 3 invasive ductal carcinoma, ER +70%, PR +41%, HER-2/neu negative, with proliferation marker of 38%. 2 of 23 lymph nodes were involved.  Margins were clear.  She received adjuvant chemotherapy consisting of 6 q. three-week doses of docetaxel/doxorubicin/cyclophosphamide given between January 2007 and may of 2007, with last dose on 04/20/2006. She underwent radiation therapy completed 07/11/2006, after which she began on tamoxifen in early August  2007.  She underwent hysterectomy with bilateral salpingo-oophorectomy on 07/15/2008, and was started on letrozole in October of 2009.  A mammogram 11/10/2009 showed microcalcifications in the left breast, and a subsequent biopsy on 11/18/2009 confirmed invasive mammary carcinoma in the left breast. PET and CT of the chest showed no evidence of metastasis in January 2011.  She underwent bilateral mastectomies 01/25/2010, with the right breast showing no evidence of malignancy; in the left breast showing a 1.0 cm grade 2 invasive ductal carcinoma with high-grade DCIS. Tumor was ER +22%, PR negative, HER-2/neu negative, with proliferation marker of 13%. Margins were clear. Patient underwent concurrent left latissimus flap reconstruction and right implant reconstruction.  She received additional chemotherapy with one cycle of carboplatin/gemcitabine given on 03/11/2010. She then received 5 q. three-week cycles of IV CMF between 03/25/2010 and 06/17/2010.  She was started on exemestane in January 2012 and continued until late November 2013 when she was hospitalized for apparent TIA.   Her subsequent history is as detailed below  INTERVAL HISTORY: Kristin Hoffman returns today for follow up of her triple negative breast cancer. Today is day 1, cycle 2 of doxil, with neulasta onpro given for granulocyte support on day 2. Last week this dose was held due to moderate mucositis and neutropenia. She was prescribed nystatin suspension to swish and spit, and she now denies mouth sores.   REVIEW OF SYSTEMS: Kristin Hoffman's main complaint today is that she is having "more bad days than good ones" since starting this medicine. Her fatigue has increased. She is having more frequent headaches. She complains of lower back pain and discomfort that  are no longer responding to tramadol and advil every 6 hours. She is using an old prescription of percocet from a previous surgery, but she does not like having to take narcotics. She denies  fevers, chills, nausea, or vomiting. She is having occasional diarrhea. Her appetite is down. She has no shortness of breath, chest pain, cough, or palpitations. She wears 2L O2 to bed at night. She complains of anxiety. She is losing her hair and is depressed about that specifically. A detailed review of systems is otherwise stable.  PAST MEDICaL HISTORY: Past Medical History  Diagnosis Date  . Depression   . Reflux   . Hx-TIA (transient ischemic attack) 11/22/2013  . Chronic headaches 11/22/2013  . Anxiety 11/22/2013  . Breast cancer ([place])   . Cancer ([place])   . Breast cancer metastasized to multiple sites [facility]) 12/24/2013    PAST SURGICAL HISTORY: Past Surgical History  Procedure Laterality Date  . Mastectomy w/ nodes partial    . Mastectomy    . Breast reconstruction    . Portacath placement      x 2  . Portacath removal      x 2  . Abdominal hysterectomy    . Tee without cardioversion  11/12/2012    Procedure: TRANSESOPHAGEAL ECHOCARDIOGRAM (TEE);  Surgeon: Kristin Furbish, MD;  [facility] ENDOSCOPY;  Service: Cardiovascular;  Laterality: N/A;  This TEE may be Kristin Hoffman or a LHC Dr    FAMILY HISTORY Both parents are alive and well. Patient has one sister who is 67 years younger and is in good health. No other history of breast or ovarian cancer in the family. Family History  Problem Relation Age of Onset  . Diabetes Mellitus II Neg Hx   . Hypertension Neg Hx     GYNECOLOGIC HISTORY:   (Updated January 2015) G2P2, menarche at age 89 with irregular menses. On birth control pills in the past. On Clomid to induce ovulation with first pregnancy. Also had preeclampsia with first pregnancy. Status post hysterectomy and bilateral salpingo-oophorectomy in August 2009.  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 She is originally from [place]. She's been married to Kristin Hoffman, for 13 years. He works as an  Pharmacist, hospital at [employer].   ADVANCED DIRECTIVES:  Not in place. At the clinic visit 12/22/2015 the patient was given the appropriate forms to complete and notarize at her discretion.    HEALTH MAINTENANCE:  (Updated 11/22/2013) Social History  Substance Use Topics  . Smoking status: Never Smoker   . Smokeless tobacco: Never Used  . Alcohol Use: Yes     Comment: rarely     Colonoscopy:  Never  PAP: S/P LAVH/BSO in August 2009  Bone density: Never  Lipid panel: Not on file   Allergies  Allergen Reactions  . Afinitor [Everolimus] Hives and Itching    Current Outpatient Prescriptions  Medication Sig Dispense Refill  . acidophilus (RISAQUAD) CAPS capsule Take 1 capsule by mouth every morning.     . Biotin 5000 MCG CAPS Take 5,000 mcg by mouth every morning.  30 capsule   . cetirizine (ZYRTEC) 10 MG tablet Take 10 mg by mouth at bedtime.     . Cholecalciferol 2000 UNITS CHEW Chew 1 tablet by mouth daily with breakfast.     . escitalopram (LEXAPRO) 20 MG tablet Take 1 tablet (20 mg total) by mouth daily. 30 tablet 12  .  Ibuprofen (ADVIL) 200 MG CAPS Take 2 capsules (400 mg total) by mouth 3 (three) times daily with meals as needed. 120 each 0  . Melatonin 10 MG TABS Take 1 tablet by mouth at bedtime.    . montelukast (SINGULAIR) 10 MG tablet Take 10 mg by mouth daily with breakfast.     . Multiple Vitamin (MULTIVITAMIN) tablet Take 1 tablet by mouth every morning.     . nystatin (MYCOSTATIN) 100000 UNIT/ML suspension Take 5 mLs (500,000 Units total) by mouth 4 (four) times daily. 240 mL 1  . omeprazole (PRILOSEC) 40 MG capsule Take 40 mg by mouth every morning.     . traMADol (ULTRAM) 50 MG tablet Take 1 tablet (50 mg total) by mouth every 6 (six) hours as needed. 120 tablet 1  . valACYclovir (VALTREX) 1000 MG tablet Take 1 tablet (1,000 mg total) by mouth daily. 60 tablet 4  . diphenoxylate-atropine (LOMOTIL) 2.5-0.025 MG tablet TAKE 2 TABLETS BY MOUTH 4 TIMES A  DAY AS NEEDED (Patient not taking: Reported on 12/22/2015) 30 tablet 0  . LORazepam (ATIVAN) 0.5 MG tablet Take 1 tablet (0.5 mg total) by mouth every 6 (six) hours as needed for anxiety. (Patient not taking: Reported on 12/22/2015) 120 tablet 1  . ondansetron (ZOFRAN) 8 MG tablet TAKE 1 TABLET BY MOUTH 2 TIMES DAILY. START DAY AFTER CHEMO FOR 2 DAYS. THEN TAKE AS NEEDED NAUSEA (Patient not taking: Reported on 12/22/2015) 30 tablet 1  . oxyCODONE-acetaminophen (PERCOCET/ROXICET) 5-325 MG tablet Take 1 tablet by mouth every 4 (four) hours as needed for severe pain. 60 tablet 0  . prochlorperazine (COMPAZINE) 10 MG tablet Take 10 mg by mouth every 6 (six) hours as needed for nausea or vomiting. Reported on 12/22/2015     No current facility-administered medications for this visit.   Facility-Administered Medications Ordered in Other Visits  Medication Dose Route Frequency Provider Last Rate Last Dose  . 0.9 %  sodium chloride infusion   Intravenous Once Chauncey Cruel, MD      . diphenhydrAMINE (BENADRYL) injection 25 mg  25 mg Intravenous Once Chauncey Cruel, MD      . DOXOrubicin HCL LIPOSOMAL (DOXIL) 76 mg in dextrose 5 % 250 mL chemo infusion  40 mg/m2 (Treatment Plan Actual) Intravenous Once Chauncey Cruel, MD      . famotidine (PEPCID) IVPB 20 mg premix  20 mg Intravenous Q12H Chauncey Cruel, MD      . heparin lock flush 100 unit/mL  500 Units Intracatheter Once PRN Chauncey Cruel, MD      . ondansetron (ZOFRAN) 8 mg, dexamethasone (DECADRON) 10 mg in sodium chloride 0.9 % 50 mL IVPB   Intravenous Once Chauncey Cruel, MD      . pegfilgrastim (NEULASTA ONPRO KIT) injection 6 mg  6 mg Subcutaneous Once Chauncey Cruel, MD      . sodium chloride 0.9 % injection 10 mL  10 mL Intracatheter PRN Chauncey Cruel, MD        OBJECTIVE: Young white woman In no acute distress  Filed Vitals:   12/22/15 1302  BP: 123/84  Pulse: 98  Temp: 98.1 F (36.7 C)  Resp: 17  Body mass index  is 31.75 kg/(m^2).  ECOG: 2 Filed Weights   12/22/15 1302  Weight: 179 lb 3.2 oz (81.285 kg)   Sclerae unicteric, EOMs intact Oropharynx clear, dentition in good repair No cervical or supraclavicular adenopathy Lungs no rales or rhonchi Heart regular rate  and rhythm Abd soft, nontender, positive bowel sounds, no masses palpated MSK no focal spinal tenderness, no upper extremity lymphedema Neuro: nonfocal, well oriented, appropriate affect Breasts: deferred    LAB RESULTS:  CBC Latest Ref Rng 12/22/2015 12/15/2015 12/04/2015  WBC 3.9 - 10.3 10e3/uL 2.1(L) 2.1(L) 1.9(L)  Hemoglobin 11.6 - 15.9 g/dL 11.6 10.7(L) 9.3(L)  Hematocrit 34.8 - 46.6 % 36.5 33.1(L) 28.9(L)  Platelets 145 - 400 10e3/uL 67(L) 60(L) 16(L)   CMP Latest Ref Rng 12/22/2015 12/15/2015 12/04/2015  Glucose 70 - 140 mg/dl 112 102 111  BUN 7.0 - 26.0 mg/dL 7.5 8.0 16.8  Creatinine 0.6 - 1.1 mg/dL 0.7 0.7 0.7  Sodium 136 - 145 mEq/L 142 142 140  Potassium 3.5 - 5.1 mEq/L 3.5 3.9 4.0  Chloride 101 - 111 mmol/L - - -  CO2 22 - 29 mEq/L _0 Calcium 8.4 - 10.4 mg/dL 8.2(L) 8.2(L) 8.3(L)  Total Protein 6.4 - 8.3 g/dL 5.1(L) 5.0(L) 4.7(L)  Total Bilirubin 0.20 - 1.20 mg/dL 1.35(H) 1.15 1.32(H)  Alkaline Phos 40 - 150 U/L 145 140 155(H)  AST 5 - 34 U/L 37(H) 34 32  ALT 0 - 55 U/L _1 STUDIES: Ct Chest W Contrast  11/23/2015  CLINICAL DATA:  Recurrent left breast cancer, status post bilateral mastectomy, chemotherapy in progress, XRT complete. Status post hysterectomy. Shortness of breath. Diarrhea. Chest discomfort x1 month. EXAM: CT CHEST, ABDOMEN, AND PELVIS WITH CONTRAST TECHNIQUE: Multidetector CT imaging of the chest, abdomen and pelvis was performed following the standard protocol during bolus administration of intravenous contrast. CONTRAST:  143m OMNIPAQUE IOHEXOL 300 MG/ML  SOLN COMPARISON:  [id number]: CT CHEST FINDINGS Mediastinum/Nodes: The heart is normal in size. No pericardial effusion.  Aberrant right subclavian artery. Right chest port terminates in the upper right atrium. Improving thoracic nodal metastases, including a --12 mm short axis left supraclavicular node (series 2/ image 9), previously 14 mm --9 mm short axis high right paratracheal node (series 2/image 15), previously 12 mm --10 mm short axis left prevascular node (series 2/image 19), previously 15 mm Additional small calcified mediastinal nodes, benign. Status post left axillary lymph node dissection. Visualized thyroid is unremarkable. Lungs/Pleura: 1.3 x 1.3 cm necrotic pleural-based nodule along the anterior right middle lobe (series 2/image 31), previously 1.5 x 1.5 cm. Small right pleural effusion along the major fissure and posterior lung base, mildly improved. 7.1 x 2.1 cm necrotic mass along the anterior left upper chest/chest wall (series 2/image 21), previously 8.1 x 2.8 cm. Associated small left pleural effusion, some of which remains loculated anteriorly along the left chest wall mass. Again noted is interlobular septal thickening with scattered pulmonary nodularity, worrisome for lymphangitic spread of tumor. 7 mm nodule in the anterior left upper lobe (series 4/image 16), previously 9 mm. Additional 8 x 5 mm nodule in the anterior left upper lobe (series 4/image 13), previously 9 x 4 mm, unchanged. Additional scattered discrete nodularity is not well visualized on the current study. No pneumothorax. Musculoskeletal: Status post bilateral mastectomy with reconstruction. Mild thoracic dextroscoliosis with degenerative changes. No focal osseous lesions. CT ABDOMEN PELVIS FINDINGS Hepatobiliary: Although difficult to discretely measure, the suspected metastasis along the anterior right hepatic dome measures approximately 3.9 x 3.6 cm on the current study (series 2/ image 44), previously 4.5 x 4.9 cm. Additional 10 x 10 mm lesion along the anterior caudate (series 2/ image 55), previously 1.6 x 1.3 cm. Additional scattered  areas of heterogeneous perfusion in  the liver. Again noted is mild gallbladder wall thickening. No intrahepatic or extrahepatic ductal dilatation. Pancreas: 2.5 x 1.7 cm hypoenhancing lesion along the pancreatic head (series 2/ image 59), previously 2.7 x 1.8 cm. Associated atrophy the pancreatic body/ tail. No pancreatic ductal dilatation. Spleen: Within normal limits. Adrenals/Urinary Tract: Adrenal glands are within normal limits. Kidneys are within normal limits.  No hydronephrosis. Bladder is mildly thick-walled although underdistended. Stomach/Bowel: Stomach is within normal limits. No evidence of bowel obstruction. Normal appendix (series 2/image 87). Vascular/Lymphatic: No evidence of abdominal aortic aneurysm. Circumaortic left renal vein. Again noted is mild soft tissue stranding adjacent to the caudate (series 2/image 56), possibly reflecting nodal soft tissue, equivocal. Otherwise, no suspicious abdominopelvic lymphadenopathy. Reproductive: Status post hysterectomy. No adnexal masses. Other: Small volume abdominopelvic ascites. No gross peritoneal nodularity. Musculoskeletal: Mild lumbar levoscoliosis. No focal osseous lesions. IMPRESSION: Status post bilateral mastectomy with left axillary lymph node dissection. Improving pleural/parenchymal pulmonary metastases with suspected lymphangitic spread, as described above. Improving thoracic lymphadenopathy. Improving hepatic and pancreatic metastases. Stable small volume abdominopelvic ascites. No gross peritoneal disease. Electronically Signed   By: Julian Hy M.D.   On: 11/23/2015 12:42   Ct Abdomen Pelvis W Contrast  11/23/2015  CLINICAL DATA:  Recurrent left breast cancer, status post bilateral mastectomy, chemotherapy in progress, XRT complete. Status post hysterectomy. Shortness of breath. Diarrhea. Chest discomfort x1 month. EXAM: CT CHEST, ABDOMEN, AND PELVIS WITH CONTRAST TECHNIQUE: Multidetector CT imaging of the chest, abdomen and  pelvis was performed following the standard protocol during bolus administration of intravenous contrast. CONTRAST:  165m OMNIPAQUE IOHEXOL 300 MG/ML  SOLN COMPARISON:  [id number]: CT CHEST FINDINGS Mediastinum/Nodes: The heart is normal in size. No pericardial effusion. Aberrant right subclavian artery. Right chest port terminates in the upper right atrium. Improving thoracic nodal metastases, including a --12 mm short axis left supraclavicular node (series 2/ image 9), previously 14 mm --9 mm short axis high right paratracheal node (series 2/image 15), previously 12 mm --10 mm short axis left prevascular node (series 2/image 19), previously 15 mm Additional small calcified mediastinal nodes, benign. Status post left axillary lymph node dissection. Visualized thyroid is unremarkable. Lungs/Pleura: 1.3 x 1.3 cm necrotic pleural-based nodule along the anterior right middle lobe (series 2/image 31), previously 1.5 x 1.5 cm. Small right pleural effusion along the major fissure and posterior lung base, mildly improved. 7.1 x 2.1 cm necrotic mass along the anterior left upper chest/chest wall (series 2/image 21), previously 8.1 x 2.8 cm. Associated small left pleural effusion, some of which remains loculated anteriorly along the left chest wall mass. Again noted is interlobular septal thickening with scattered pulmonary nodularity, worrisome for lymphangitic spread of tumor. 7 mm nodule in the anterior left upper lobe (series 4/image 16), previously 9 mm. Additional 8 x 5 mm nodule in the anterior left upper lobe (series 4/image 13), previously 9 x 4 mm, unchanged. Additional scattered discrete nodularity is not well visualized on the current study. No pneumothorax. Musculoskeletal: Status post bilateral mastectomy with reconstruction. Mild thoracic dextroscoliosis with degenerative changes. No focal osseous lesions. CT ABDOMEN PELVIS FINDINGS Hepatobiliary: Although difficult to discretely measure, the suspected  metastasis along the anterior right hepatic dome measures approximately 3.9 x 3.6 cm on the current study (series 2/ image 44), previously 4.5 x 4.9 cm. Additional 10 x 10 mm lesion along the anterior caudate (series 2/ image 55), previously 1.6 x 1.3 cm. Additional scattered areas of heterogeneous perfusion in the liver. Again noted is mild gallbladder  wall thickening. No intrahepatic or extrahepatic ductal dilatation. Pancreas: 2.5 x 1.7 cm hypoenhancing lesion along the pancreatic head (series 2/ image 59), previously 2.7 x 1.8 cm. Associated atrophy the pancreatic body/ tail. No pancreatic ductal dilatation. Spleen: Within normal limits. Adrenals/Urinary Tract: Adrenal glands are within normal limits. Kidneys are within normal limits.  No hydronephrosis. Bladder is mildly thick-walled although underdistended. Stomach/Bowel: Stomach is within normal limits. No evidence of bowel obstruction. Normal appendix (series 2/image 87). Vascular/Lymphatic: No evidence of abdominal aortic aneurysm. Circumaortic left renal vein. Again noted is mild soft tissue stranding adjacent to the caudate (series 2/image 56), possibly reflecting nodal soft tissue, equivocal. Otherwise, no suspicious abdominopelvic lymphadenopathy. Reproductive: Status post hysterectomy. No adnexal masses. Other: Small volume abdominopelvic ascites. No gross peritoneal nodularity. Musculoskeletal: Mild lumbar levoscoliosis. No focal osseous lesions. IMPRESSION: Status post bilateral mastectomy with left axillary lymph node dissection. Improving pleural/parenchymal pulmonary metastases with suspected lymphangitic spread, as described above. Improving thoracic lymphadenopathy. Improving hepatic and pancreatic metastases. Stable small volume abdominopelvic ascites. No gross peritoneal disease. Electronically Signed   By: Julian Hy M.D.   On: 11/23/2015 12:42   ASSESSMENT: 50 y.o. [place] woman  (1)  status post left lumpectomy under the care  of Kristin Hoffman on 11/18/2005 for a 2.5 cm grade 3 invasive ductal carcinoma, ER +70%, PR +41%, HER-2/neu negative, with proliferation marker of 38%. 2 of 23 lymph nodes were involved.  Margins were clear.  (2) status post adjuvant chemotherapy consisting of 6 q. three-week doses of docetaxel/ doxorubicin/ cyclophosphamide given between January 2007 and may of 2007, with last dose on 04/20/2006.  (3) status post radiation therapy to the left breast, completed 07/11/2006  (4) began on tamoxifen in early August 2007 and continued until October 2009. She status post hysterectomy with bilateral salpingo-oophorectomy on 07/15/2008, and was started on letrozole in October of 2009.  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 PET and CT of the chest showed no evidence of metastasis in January 2011.  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 Margins were clear. Patient underwent concurrent left latissimus flap reconstruction and right implant reconstruction.  (7)  status post additional chemotherapy with one cycle of carboplatin/gemcitabine given on 03/11/2010. She then received 5 q. three-week cycles of IV CMF between 03/25/2010 and 06/17/2010.  (8) started on exemestane in January 2012 and continued until late November 2013 when she was hospitalized for apparent TIA at which time her exemestane was stopped and not resumed  (9) METASTATIC DISEASE: evidence of disease recurence noted on chest CT, liver MRI and PET scan in December 2014, with suspicious lung nodules, lymphadenopathy, and 3 lesions in the right lobe of the liver, but no evidence of bony disease and no evidence of  brain involvement by brain MRI September 2014. Biopsy of a left supraclavicular lymph node on 12/11/2013 confirmed metastatic carcinoma, estrogen receptor 45% positive, progesterone receptor 17% positive, with no HER-2 amplification.  (10) fulvestrant started 12/03/2013, discontinued 03/25/2014 with progression  (11) exemestane/ everolimus started 04/04/2014; everolimus held 04/14/2014, with skin rash and mouth soreness; resumed at 5 mg a day as of 04/29/2014, discontinued 05/09/2014 with similar symptoms  (12) continuing exemestane, adding palbociclib 05/26/2014;   (a) palbociclib dose  dropped to 75 mg every other day for 21 days beginning with cycle 4  (b) exemestane and Palbociclib discontinued December 2015 with evidence of progression  (13) [facility] referral placed 04/28/2049 -- re-sent 05/23/2014-- shows Pi3KCA and TP53 mutations  (a) [facility] one test requested 12/10/2014-- confirms [employer] results  (b) did not qualify for capecitabine/ BYL 719 trial because of elevated lipase  (14) started eribulin 12/26/2014, 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   (a) dpse dropped from 2g BID to 1.5g BID starting on 04/26/15  (b) stopped 05/17/2015 (after 4 cycles) with progression  (16) started carboplatin/ gemcitabine 05/26/2015, given day one and day 8 of each 21 day cycle for a total of 8 cycles, last dose 11/10/2015, stopped because of persistent cytopenias  (a) given her history of severe neutropenia with this chemotherapy we'll do Neupogen days 2, 3 and 4 of each cycle and onpro day 9  (b) carbo dose dropped by 25% w cycle 2 due to very low platelet nadir  (c) CT scans after 3 cycles (07/27/2015) show measurable response  (d) CT scans after 6 cycles (09/28/2015)show continuing response  (e) CT scans post-treatment  (11/23/2015) shows continuing response  (17) Left pleural effusion\   (a) s/p L thoracentesis 05/22/2015, 05/29/2015 and 06/05/2015  (b) attempt at pleurx 06/12/2015 cancelled as effusion loculated and scant  (18) symptomatic anemia  (a) Aranesp started 07/28/2015, retic <1% on 10/06/2015  (b) Feraheme given 08/04/2015; ferritin >2K on 10/06/2015, last dose on 11/1  (19) liposomal doxorubicin started 11/24/2015, repeated every 21 days w onpro support  (a) echo 03/23/2015 shows an EF of 55%  PLAN:  Codi has concerns about switching to doxil. She thought she would have more energy and less pain but this has not been case so far. I have written her a refill for percocet for her back pain. Her mucositis has resolved, but I have called in magic mouthwash to her pharmacy as she was disappointed that the nystatin she was given did not have lidocaine in it. The labs were reviewed in detail. Her ANC is 1.2 despite neulasta use 4 weeks ago, but her platelets are up to 67. I discussed these results with [name] and he is agreeable to proceeding with cycle 2 of doxil as planned.   The patient knows that if she chooses, due to mucositis or otherwise, these treatments can be given every 4 weeks instead of every 3. We will make that decision when she returns later this month.   Zannah is scheduled for labs and a follow up visit on 1/24. She understands and agrees with this plan. She knows the goal of treatment in her case is control. She has been encouraged to call with any issues that might arise before her next visit here.    Laurie Panda, NP   12/22/2015 2:13 PM

## 2015-12-22 NOTE — Progress Notes (Signed)
Reviewed labs with Gentry Fitz NP ( Bilirubin 1.35, WBC 2.1, ANC 1.2, and platelets 67). Okay to proceed with treatment and pt will not receive Aranesp per Gentry Fitz NP

## 2016-01-05 ENCOUNTER — Other Ambulatory Visit: Payer: 59

## 2016-01-05 ENCOUNTER — Ambulatory Visit (HOSPITAL_BASED_OUTPATIENT_CLINIC_OR_DEPARTMENT_OTHER): Payer: 59

## 2016-01-05 ENCOUNTER — Other Ambulatory Visit: Payer: Self-pay

## 2016-01-05 ENCOUNTER — Telehealth: Payer: Self-pay | Admitting: Oncology

## 2016-01-05 ENCOUNTER — Ambulatory Visit (HOSPITAL_BASED_OUTPATIENT_CLINIC_OR_DEPARTMENT_OTHER): Payer: 59 | Admitting: Nurse Practitioner

## 2016-01-05 ENCOUNTER — Other Ambulatory Visit (HOSPITAL_BASED_OUTPATIENT_CLINIC_OR_DEPARTMENT_OTHER): Payer: 59

## 2016-01-05 ENCOUNTER — Ambulatory Visit: Payer: 59

## 2016-01-05 ENCOUNTER — Ambulatory Visit (HOSPITAL_COMMUNITY)
Admission: RE | Admit: 2016-01-05 | Discharge: 2016-01-05 | Disposition: A | Discharge status: Home / Self Care | Payer: 59 | Source: Ambulatory Visit | Attending: Oncology | Admitting: Oncology

## 2016-01-05 VITALS — BP 124/77 | HR 91 | Temp 97.9°F | Resp 18 | Ht 63.0 in | Wt 175.0 lb

## 2016-01-05 VITALS — BP 135/89 | HR 83 | Temp 98.8°F | Resp 16

## 2016-01-05 DIAGNOSIS — D696 Thrombocytopenia, unspecified: Secondary | ICD-10-CM

## 2016-01-05 DIAGNOSIS — D702 Other drug-induced agranulocytosis: Secondary | ICD-10-CM

## 2016-01-05 DIAGNOSIS — D630 Anemia in neoplastic disease: Secondary | ICD-10-CM

## 2016-01-05 DIAGNOSIS — C50912 Malignant neoplasm of unspecified site of left female breast: Secondary | ICD-10-CM

## 2016-01-05 DIAGNOSIS — D649 Anemia, unspecified: Secondary | ICD-10-CM

## 2016-01-05 DIAGNOSIS — C50312 Malignant neoplasm of lower-inner quadrant of left female breast: Secondary | ICD-10-CM

## 2016-01-05 DIAGNOSIS — D701 Agranulocytosis secondary to cancer chemotherapy: Secondary | ICD-10-CM | POA: Diagnosis not present

## 2016-01-05 DIAGNOSIS — Z5189 Encounter for other specified aftercare: Secondary | ICD-10-CM | POA: Diagnosis not present

## 2016-01-05 DIAGNOSIS — Z8673 Personal history of transient ischemic attack (TIA), and cerebral infarction without residual deficits: Secondary | ICD-10-CM

## 2016-01-05 DIAGNOSIS — C50919 Malignant neoplasm of unspecified site of unspecified female breast: Secondary | ICD-10-CM

## 2016-01-05 DIAGNOSIS — Z95828 Presence of other vascular implants and grafts: Secondary | ICD-10-CM

## 2016-01-05 LAB — COMPREHENSIVE METABOLIC PANEL
ALT: 16 U/L (ref 0–55)
ANION GAP: 6 meq/L (ref 3–11)
AST: 26 U/L (ref 5–34)
Albumin: 2.7 g/dL — ABNORMAL LOW (ref 3.5–5.0)
Alkaline Phosphatase: 116 U/L (ref 40–150)
BILIRUBIN TOTAL: 1.63 mg/dL — AB (ref 0.20–1.20)
BUN: 14 mg/dL (ref 7.0–26.0)
CHLORIDE: 105 meq/L (ref 98–109)
CO2: 30 meq/L — AB (ref 22–29)
Calcium: 8.8 mg/dL (ref 8.4–10.4)
Creatinine: 0.7 mg/dL (ref 0.6–1.1)
Glucose: 114 mg/dl (ref 70–140)
POTASSIUM: 3.8 meq/L (ref 3.5–5.1)
Sodium: 140 mEq/L (ref 136–145)
TOTAL PROTEIN: 5.1 g/dL — AB (ref 6.4–8.3)

## 2016-01-05 LAB — CBC WITH DIFFERENTIAL/PLATELET
BASO%: 2.8 % — ABNORMAL HIGH (ref 0.0–2.0)
Basophils Absolute: 0 10*3/uL (ref 0.0–0.1)
EOS%: 6.3 % (ref 0.0–7.0)
Eosinophils Absolute: 0 10*3/uL (ref 0.0–0.5)
HCT: 26.5 % — ABNORMAL LOW (ref 34.8–46.6)
HGB: 8.7 g/dL — ABNORMAL LOW (ref 11.6–15.9)
LYMPH%: 66.3 % — ABNORMAL HIGH (ref 14.0–49.7)
MCH: 35.2 pg — ABNORMAL HIGH (ref 25.1–34.0)
MCHC: 32.7 g/dL (ref 31.5–36.0)
MCV: 107.9 fL — ABNORMAL HIGH (ref 79.5–101.0)
MONO#: 0 10*3/uL — ABNORMAL LOW (ref 0.1–0.9)
MONO%: 4.4 % (ref 0.0–14.0)
NEUT#: 0.1 10*3/uL — CL (ref 1.5–6.5)
NEUT%: 20.2 % — ABNORMAL LOW (ref 38.4–76.8)
Platelets: 8 10*3/uL — CL (ref 145–400)
RBC: 2.46 10*6/uL — ABNORMAL LOW (ref 3.70–5.45)
RDW: 25.1 % — ABNORMAL HIGH (ref 11.2–14.5)
WBC: 0.5 10*3/uL — CL (ref 3.9–10.3)
lymph#: 0.4 10*3/uL — ABNORMAL LOW (ref 0.9–3.3)

## 2016-01-05 LAB — PREPARE RBC (CROSSMATCH)

## 2016-01-05 MED ORDER — TRAMADOL HCL 50 MG PO TABS: 50.0000 mg | ORAL_TABLET | Freq: Four times a day (QID) | ORAL | Status: DC | PRN

## 2016-01-05 MED ORDER — DIPHENHYDRAMINE HCL 25 MG PO CAPS
ORAL_CAPSULE | ORAL | Status: AC
  Filled 2016-01-05: qty 1

## 2016-01-05 MED ORDER — ACETAMINOPHEN 325 MG PO TABS
ORAL_TABLET | ORAL | Status: AC
  Filled 2016-01-05: qty 2

## 2016-01-05 MED ORDER — PEGFILGRASTIM INJECTION 6 MG/0.6ML ~~LOC~~
6.0000 mg | PREFILLED_SYRINGE | Freq: Once | SUBCUTANEOUS | Status: AC
  Administered 2016-01-05: 6 mg via SUBCUTANEOUS
  Filled 2016-01-05: qty 0.6

## 2016-01-05 MED ORDER — SODIUM CHLORIDE 0.9 % IJ SOLN
10.0000 mL | INTRAMUSCULAR | Status: DC | PRN
  Administered 2016-01-05: 10 mL via INTRAVENOUS
  Filled 2016-01-05: qty 10

## 2016-01-05 MED ORDER — SODIUM CHLORIDE 0.9 % IJ SOLN
10.0000 mL | INTRAMUSCULAR | Status: AC | PRN
  Administered 2016-01-05: 10 mL
  Filled 2016-01-05: qty 10

## 2016-01-05 MED ORDER — LORAZEPAM 0.5 MG PO TABS: ORAL_TABLET | ORAL | Status: DC

## 2016-01-05 MED ORDER — DIPHENHYDRAMINE HCL 25 MG PO CAPS
25.0000 mg | ORAL_CAPSULE | Freq: Once | ORAL | Status: AC
  Administered 2016-01-05: 25 mg via ORAL

## 2016-01-05 MED ORDER — ACETAMINOPHEN 325 MG PO TABS
650.0000 mg | ORAL_TABLET | Freq: Once | ORAL | Status: AC
  Administered 2016-01-05: 650 mg via ORAL

## 2016-01-05 MED ORDER — HEPARIN SOD (PORK) LOCK FLUSH 100 UNIT/ML IV SOLN
500.0000 [IU] | Freq: Every day | INTRAVENOUS | Status: AC | PRN
  Administered 2016-01-05: 500 [IU]
  Filled 2016-01-05: qty 5

## 2016-01-05 NOTE — Patient Instructions (Signed)
Blood Transfusion   A blood transfusion is a procedure that gives you donated blood through an IV tube. You may need blood because of illness, surgery, or injury. The blood may come from a donor. The blood may also be your own blood that you donated earlier.  The blood you get is made up of different types of cells. You may get:    Red blood cells. These carry oxygen and replace lost blood.    Platelets. These control bleeding.    Plasma. This helps blood to clot.  If you have a clotting disorder, you may also get other types of blood products.   BEFORE THE PROCEDURE   You may have a blood test. This finds out what type of blood you have. It also finds out what kind of blood your body will accept.    If you are going to have a planned surgery, you may donate your own blood. This is done in case you need to have a transfusion.    If you have had an allergic transfusion reaction before, you may be given medicine to help prevent a reaction. Take this medicine only as told by your doctor.   You will have your temperature, blood pressure, and pulse checked.  PROCEDURE    An IV will be started in your hand or arm.    The bag of donated blood will be attached to your IV and run into your vein.    A doctor will regularly check your temperature, blood pressure, and pulse during the procedure. This is done to find any early signs of a transfusion reaction.   If you have any signs or symptoms of a reaction, the procedure may be stopped and you may be given medicine.    When the transfusion is over, your IV will be removed.    Pressure may be applied to the IV site for a few minutes.    A bandage (dressing) will be applied.   The procedure may vary among doctors and hospitals.   AFTER THE PROCEDURE   Your blood pressure, temperature, and pulse will be checked regularly.     This information is not intended to replace advice given to you by your health care provider. Make sure you discuss any questions  you have with your health care provider.     Document Released: 02/24/2009 Document Revised: 12/19/2014 Document Reviewed: 10/08/2014  [employer] Interactive Patient Education 2016 [employer]

## 2016-01-05 NOTE — Progress Notes (Signed)
HAR ordered.  Appt made for infusion.  Appt made for lab/flush.  Blood bank notified.  Orders entered.

## 2016-01-05 NOTE — Patient Instructions (Signed)

## 2016-01-05 NOTE — Telephone Encounter (Signed)
Appointments made and patient will get a new avs in chemo today °

## 2016-01-05 NOTE — Progress Notes (Signed)
ID: [name] OB: 09/05/1966  MR#: [id number]  OQH#:[id number]  PCP: Drake Leach, MD GYN:  [name] SU: [name] OTHER MD: [name], [name]  CHIEF COMPLAINT:  Stage IV breast cancer  CURRENT TREATMENT: Liposomal doxorubicin  BREAST CANCER HISTORY: This patient was previously followed by [name], and was transferred to [name] service as of 08/20/2013.  At the age of 50, the patient had a screening mammogram as a baseline. She had recently given birth and was on birth control pills at that time. The mammogram in November 2006 showed an area of architectural distortion in the left lower inner quadrant. Subsequently an ultrasound was obtained of the left breast showing a 2.0 x 1.5 x 1.4 cm mass, at the 8:00 position, 4 cm from the nipple. A core biopsy performed on 10/21/2005 showed an invasive mammary carcinoma, ER +70%, PR +41%, HER-2/neu negative, with proliferation marker of 38%. [phone])  Breast MRI on 11/08/2005 confirmed a 2.5 cm spiculated mass in the left lower inner quadrant with 3 small satellite nodules adjacent to the primary mass: 3.5 cm posterior medial to the primary mass, measuring 9 mm; 1.5 cm anterior medial to the primary mass measuring 5 mm; and 2 cm medial to the primary mass measuring 5 mm. No axillary adenopathy was noted. No suspicious masses or enhancement are noted in the right breast.  Vera underwentt left lumpectomy under the care of Dr. 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 2 of 23 lymph nodes were involved.  Margins were clear.  She received adjuvant chemotherapy consisting of 6 q. three-week doses of docetaxel/doxorubicin/cyclophosphamide given between January 2007 and may of 2007, with last dose on 04/20/2006. She underwent radiation therapy completed 07/11/2006, after which she began on tamoxifen in early August  2007.  She underwent hysterectomy with bilateral salpingo-oophorectomy on 07/15/2008, and was started on letrozole in October of 2009.  A mammogram 11/10/2009 showed microcalcifications in the left breast, and a subsequent biopsy on 11/18/2009 confirmed invasive mammary carcinoma in the left breast. PET and CT of the chest showed no evidence of metastasis in January 2011.  She underwent bilateral mastectomies 01/25/2010, with the right breast showing no evidence of malignancy; in the left breast showing a 1.0 cm grade 2 invasive ductal carcinoma with high-grade DCIS. Tumor was ER +22%, PR negative, HER-2/neu negative, with proliferation marker of 13%. Margins were clear. Patient underwent concurrent left latissimus flap reconstruction and right implant reconstruction.  She received additional chemotherapy with one cycle of carboplatin/gemcitabine given on 03/11/2010. She then received 5 q. three-week cycles of IV CMF between 03/25/2010 and 06/17/2010.  She was started on exemestane in January 2012 and continued until late November 2013 when she was hospitalized for apparent TIA.   Her subsequent history is as detailed below  INTERVAL HISTORY: Philippa returns today for follow up of her triple negative breast cancer. Today is day 15, cycle 2 of doxil, with neulasta onpro given for granulocyte support on day 2. She is being treated every 4 weeks instead of the every 3 weeks originally planned, because of counts, functional status, and mucositis concerns.  REVIEW OF SYSTEMS: Lois is feeling more tired than her usual. She did not take a nap yesterday, but she slept the whole weekend she says. She was taking oxycodone at that time and it does "knock her out". He doesn't constipate her. During the week,  when she has no "back up" at home and home schools her kids, she takes 2 ibuprofen with a 50 mg tramadol 3 times a day and that tends to hold things. In the evening she takes a half or one oxycodone.  Sitting  or standing or walking she does not hurt but if she takes a deep breath it hurts. She also has some discomfort in the groin area bilaterally. There has been no fever, no rash, and no bleeding. She has developed minimal petechiae. She is very aware of these and knows exactly what they mean. There has been no melena or bright red blood per rectum. There has been no hemoptysis or she has had no unusual headaches, visual changes, nausea or vomiting. She has altered taste. She is just beginning to get a little bit of soreness in the back of her throat but not in her mouth. A detailed review of systems today was otherwise stable.  PAST MEDICaL HISTORY: Past Medical History  Diagnosis Date  . Depression   . Reflux   . Hx-TIA (transient ischemic attack) 11/22/2013  . Chronic headaches 11/22/2013  . Anxiety 11/22/2013  . Breast cancer ([place])   . Cancer ([place])   . Breast cancer metastasized to multiple sites [facility]) 12/24/2013    PAST SURGICAL HISTORY: Past Surgical History  Procedure Laterality Date  . Mastectomy w/ nodes partial    . Mastectomy    . Breast reconstruction    . Portacath placement      x 2  . Portacath removal      x 2  . Abdominal hysterectomy    . Tee without cardioversion  11/12/2012    Procedure: TRANSESOPHAGEAL ECHOCARDIOGRAM (TEE);  Surgeon: Candee Furbish, MD;  Location: [facility] ENDOSCOPY;  Service: Cardiovascular;  Laterality: N/A;  This TEE may be Dr. Marlou Porch or a LHC Dr    FAMILY HISTORY Both parents are alive and well. Patient has one sister who is 57 years younger and is in good health. No other history of breast or ovarian cancer in the family. Family History  Problem Relation Age of Onset  . Diabetes Mellitus II Neg Hx   . Hypertension Neg Hx     GYNECOLOGIC HISTORY:   (Updated January 2015) G2P2, menarche at age 50 with irregular menses. On birth control pills in the past. On Clomid to induce ovulation with first pregnancy. Also had preeclampsia with first pregnancy.  Status post hysterectomy and bilateral salpingo-oophorectomy in August 2009.  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 She is originally from [place]. She's been married to [place], for 13 years. He works as an Pharmacist, hospital at [employer].   ADVANCED DIRECTIVES:  Not in place. At the clinic visit 01/05/2016 the patient was given the appropriate forms to complete and notarize at her discretion.    HEALTH MAINTENANCE:  (Updated 11/22/2013) Social History  Substance Use Topics  . Smoking status: Never Smoker   . Smokeless tobacco: Never Used  . Alcohol Use: Yes     Comment: rarely     Colonoscopy:  Never  PAP: S/P LAVH/BSO in August 2009  Bone density: Never  Lipid panel: Not on file   Allergies  Allergen Reactions  . Afinitor [Everolimus] Hives and Itching    Current Outpatient Prescriptions  Medication Sig Dispense Refill  . acidophilus (RISAQUAD) CAPS capsule Take 1 capsule by mouth every morning.     Marland Kitchen  Biotin 5000 MCG CAPS Take 5,000 mcg by mouth every morning.  30 capsule   . cetirizine (ZYRTEC) 10 MG tablet Take 10 mg by mouth at bedtime.     . Cholecalciferol 2000 UNITS CHEW Chew 1 tablet by mouth daily with breakfast.     . diphenoxylate-atropine (LOMOTIL) 2.5-0.025 MG tablet TAKE 2 TABLETS BY MOUTH 4 TIMES A DAY AS NEEDED (Patient not taking: Reported on 12/22/2015) 30 tablet 0  . escitalopram (LEXAPRO) 20 MG tablet Take 1 tablet (20 mg total) by mouth daily. 30 tablet 12  . Ibuprofen (ADVIL) 200 MG CAPS Take 2 capsules (400 mg total) by mouth 3 (three) times daily with meals as needed. 120 each 0  . LORazepam (ATIVAN) 0.5 MG tablet Take one tablet at bedtime as needed for sleep 120 tablet 1  . Melatonin 10 MG TABS Take 1 tablet by mouth at bedtime.    . montelukast (SINGULAIR) 10 MG tablet Take 10 mg by mouth daily with breakfast.     . Multiple Vitamin (MULTIVITAMIN)  tablet Take 1 tablet by mouth every morning.     . nystatin (MYCOSTATIN) 100000 UNIT/ML suspension Take 5 mLs (500,000 Units total) by mouth 4 (four) times daily. 240 mL 1  . omeprazole (PRILOSEC) 40 MG capsule Take 40 mg by mouth every morning.     . ondansetron (ZOFRAN) 8 MG tablet TAKE 1 TABLET BY MOUTH 2 TIMES DAILY. START DAY AFTER CHEMO FOR 2 DAYS. THEN TAKE AS NEEDED NAUSEA (Patient not taking: Reported on 12/22/2015) 30 tablet 1  . oxyCODONE-acetaminophen (PERCOCET/ROXICET) 5-325 MG tablet Take 1 tablet by mouth every 4 (four) hours as needed for severe pain. 60 tablet 0  . prochlorperazine (COMPAZINE) 10 MG tablet Take 10 mg by mouth every 6 (six) hours as needed for nausea or vomiting. Reported on 12/22/2015    . traMADol (ULTRAM) 50 MG tablet Take 1-2 tablets (50-100 mg total) by mouth every 6 (six) hours as needed. 120 tablet 1  . valACYclovir (VALTREX) 1000 MG tablet Take 1 tablet (1,000 mg total) by mouth daily. 60 tablet 4   No current facility-administered medications for this visit.   Facility-Administered Medications Ordered in Other Visits  Medication Dose Route Frequency Provider Last Rate Last Dose  . sodium chloride 0.9 % injection 10 mL  10 mL Intravenous PRN Chauncey Cruel, MD   10 mL at 01/05/16 1059    OBJECTIVE: Young white woman who appears pale Filed Vitals:   01/05/16 1023  BP: 124/77  Pulse: 91  Temp: 97.9 F (36.6 C)  Resp: 18  Body mass index is 31.01 kg/(m^2).  ECOG: 2 Filed Weights   01/05/16 1023  Weight: 175 lb (79.379 kg)   Sclerae unicteric, pupils round and equal Oropharynx clear and moist-- no thrush or other lesions noted No cervical or supraclavicular adenopathy Lungs no rales or rhonchi Heart regular rate and rhythm Abd soft, nontender, positive bowel sounds MSK no focal spinal tenderness, no upper extremity lymphedema Neuro: nonfocal, well oriented, appropriate affect Breasts: deferred Skin: Few petechiae noted     LAB  RESULTS:  CBC Latest Ref Rng 01/05/2016 12/22/2015 12/15/2015  WBC 3.9 - 10.3 10e3/uL 0.5(LL) 2.1(L) 2.1(L)  Hemoglobin 11.6 - 15.9 g/dL 8.7(L) 11.6 10.7(L)  Hematocrit 34.8 - 46.6 % 26.5(L) 36.5 33.1(L)  Platelets 145 - 400 10e3/uL 8(LL) 67(L) 60(L)   CMP Latest Ref Rng 01/05/2016 12/22/2015 12/15/2015  Glucose 70 - 140 mg/dl 114 112 102  BUN 7.0 - 26.0 mg/dL 14.0  7.5 8.0  Creatinine 0.6 - 1.1 mg/dL 0.7 0.7 0.7  Sodium 136 - 145 mEq/L 140 142 142  Potassium 3.5 - 5.1 mEq/L 3.8 3.5 3.9  Chloride 101 - 111 mmol/L - - -  CO2 22 - 29 mEq/L 30(H) 27 29  Calcium 8.4 - 10.4 mg/dL 8.8 8.2(L) 8.2(L)  Total Protein 6.4 - 8.3 g/dL 5.1(L) 5.1(L) 5.0(L)  Total Bilirubin 0.20 - 1.20 mg/dL 1.63(H) 1.35(H) 1.15  Alkaline Phos 40 - 150 U/L 116 145 140  AST 5 - 34 U/L 26 37(H) 34  ALT 0 - 55 U/L 16 19 23     STUDIES: No results found.    ASSESSMENT: 50 y.o. [place] woman  (1)  status post left lumpectomy under the care of Dr. 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 2 of 23 lymph nodes were involved.  Margins were clear.  (2) status post adjuvant chemotherapy consisting of 6 q. three-week doses of docetaxel/ doxorubicin/ cyclophosphamide given between January 2007 and may of 2007, with last dose on 04/20/2006.  (3) status post radiation therapy to the left breast, completed 07/11/2006  (4) began on tamoxifen in early August 2007 and continued until October 2009. She status post hysterectomy with bilateral salpingo-oophorectomy on 07/15/2008, and was started on letrozole in October of 2009.  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 PET and CT of the chest showed no evidence of metastasis in January 2011.  (6) status post bilateral mastectomies 01/25/2010, with the right breast  showing no evidence of malignancy; in the left breast showing a 1.0 cm grade 2 invasive ductal carcinoma,  ER +22%, PR negative, HER-2/neu negative, with proliferation marker of 13%. Margins were clear. Patient underwent concurrent left latissimus flap reconstruction and right implant reconstruction.  (7)  status post additional chemotherapy with one cycle of carboplatin/gemcitabine given on 03/11/2010. She then received 5 q. three-week cycles of IV CMF between 03/25/2010 and 06/17/2010.  (8) started on exemestane in January 2012 and continued until late November 2013 when she was hospitalized for apparent TIA at which time her exemestane was stopped and not resumed  (9) METASTATIC DISEASE: evidence of disease recurence noted on chest CT, liver MRI and PET scan in December 2014, with suspicious lung nodules, lymphadenopathy, and 3 lesions in the right lobe of the liver, but no evidence of bony disease and no evidence of brain involvement by brain MRI September 2014. Biopsy of a left supraclavicular lymph node on 12/11/2013 confirmed metastatic carcinoma, estrogen receptor 45% positive, progesterone receptor 17% positive, with no HER-2 amplification.  (10) fulvestrant started 12/03/2013, discontinued 03/25/2014 with progression  (11) exemestane/ everolimus started 04/04/2014; everolimus held 04/14/2014, with skin rash and mouth soreness; resumed at 5 mg a day as of 04/29/2014, discontinued 05/09/2014 with similar symptoms  (12) continuing exemestane, adding palbociclib 05/26/2014;   (a) palbociclib dose dropped to 75 mg every other day for 21 days beginning with cycle 4  (b) exemestane and Palbociclib discontinued December 2015 with evidence of progression  (13) [facility] referral placed 04/28/2049 -- re-sent 05/23/2014-- shows Pi3KCA and TP53 mutations  (a) [facility] one test requested 12/10/2014-- confirms [employer] results  (b) did not qualify for capecitabine/ BYL 719 trial because of elevated  lipase  (14) started eribulin 12/26/2014, stopped 01/23/2015 after 2 cycles because of neuropathy; repeat liver MRI 02/25/2015  shows progression   (15) capecitabine started 03/23/2015, initially 2 weeks 1 week off, then every other week starting with cycle 2.   (a) dpse dropped from 2g BID to 1.5g BID starting on 04/26/15  (b) stopped 05/17/2015 (after 4 cycles) with progression  (16) started carboplatin/ gemcitabine 05/26/2015, given day one and day 8 of each 21 day cycle for a total of 8 cycles, last dose 11/10/2015, stopped because of persistent cytopenias  (a) given her history of severe neutropenia with this chemotherapy we'll do Neupogen days 2, 3 and 4 of each cycle and onpro day 9  (b) carbo dose dropped by 25% w cycle 2 due to very low platelet nadir  (c) CT scans after 3 cycles (07/27/2015) show measurable response  (d) CT scans after 6 cycles (09/28/2015)show continuing response  (e) CT scans post-treatment (11/23/2015) shows continuing response  (17) Left pleural effusion\   (a) s/p L thoracentesis 05/22/2015, 05/29/2015 and 06/05/2015  (b) attempt at pleurx 06/12/2015 cancelled as effusion loculated and scant  (18) symptomatic anemia  (a) Aranesp started 07/28/2015, retic <1% on 10/06/2015  (b) Feraheme given 08/04/2015; ferritin >2K on 10/06/2015, last dose on 11/1  (19) liposomal doxorubicin started 11/24/2015, repeated every 21 days w onpro support  (a) echo 03/23/2015 shows an EF of 55%  PLAN:  Adrain is severely pancytopenic, doubtless because of marrow infiltrationin addition to her chemotherapy. We are proceeding with transfusion of 2 units of packed red cells today as well as 1 unit of platelets. I am also giving her a second dose of Neulasta, since we are not going to be treating her for another 2 weeks.  I have asked her to alert [place] if her temperature rises above 100. She may need admission for intravenous antibiotics if that is the case. I have also asked her to  continue both the Valtrex and Diflucan even though there is no obvious thrush at this point in her mouth. She does have very early symptoms of mucositis.  We will drop her treatment dose by 20-25% with the next cycle.  I think the pain she is having when she takes a deep breath is going to be due to pleural involvement from her tumor. We do not have any evidence for bony disease so is not going to be her ribs. She is not having any cough or worsening shortness of breath so I don't think this necessarily means there is disease progression.  The pain only occurs when she takes a deep breath. She has been using ibuprofen chiefly to control this. The problem of course is that that can affect the platelets and she has very few platelets. I suggested she increase the tramadol 200 mg 4 times a day and use Tylenol with that. She can use the OxyContin understanding that that also contains some Tylenol. It does tend to upset her stomach and "knock her out".  Astryd has an appointment for next week. I have changed her treatment date from that week to 01/19/2016 and she will see me on that day. After the third cycle of Doxil we will restage with a CT scan of the chest.  She knows to call for any problems that may develop before the next visit here.     Chauncey Cruel, MD   01/05/2016 11:01 AM

## 2016-01-06 LAB — TYPE AND SCREEN
ABO/RH(D): A POS
ANTIBODY SCREEN: NEGATIVE
Unit division: 0
Unit division: 0

## 2016-01-06 LAB — PREPARE PLATELET PHERESIS: UNIT DIVISION: 0

## 2016-01-11 ENCOUNTER — Other Ambulatory Visit: Payer: Self-pay | Admitting: *Deleted

## 2016-01-11 MED ORDER — FLUCONAZOLE 100 MG PO TABS: 100.0000 mg | ORAL_TABLET | Freq: Every day | ORAL | Status: DC

## 2016-01-12 ENCOUNTER — Ambulatory Visit (HOSPITAL_BASED_OUTPATIENT_CLINIC_OR_DEPARTMENT_OTHER): Payer: 59

## 2016-01-12 ENCOUNTER — Encounter: Payer: Self-pay | Admitting: Nurse Practitioner

## 2016-01-12 ENCOUNTER — Other Ambulatory Visit: Payer: Self-pay | Admitting: *Deleted

## 2016-01-12 ENCOUNTER — Ambulatory Visit: Payer: 59

## 2016-01-12 ENCOUNTER — Ambulatory Visit (HOSPITAL_BASED_OUTPATIENT_CLINIC_OR_DEPARTMENT_OTHER): Payer: 59 | Admitting: Nurse Practitioner

## 2016-01-12 ENCOUNTER — Other Ambulatory Visit (HOSPITAL_BASED_OUTPATIENT_CLINIC_OR_DEPARTMENT_OTHER): Payer: 59

## 2016-01-12 ENCOUNTER — Telehealth: Payer: Self-pay | Admitting: Oncology

## 2016-01-12 VITALS — BP 123/72 | HR 88 | Temp 97.7°F | Resp 18 | Ht 63.0 in | Wt 171.4 lb

## 2016-01-12 DIAGNOSIS — C50312 Malignant neoplasm of lower-inner quadrant of left female breast: Secondary | ICD-10-CM

## 2016-01-12 DIAGNOSIS — Z95828 Presence of other vascular implants and grafts: Secondary | ICD-10-CM

## 2016-01-12 DIAGNOSIS — R530 Neoplastic (malignant) related fatigue: Secondary | ICD-10-CM

## 2016-01-12 DIAGNOSIS — R52 Pain, unspecified: Secondary | ICD-10-CM

## 2016-01-12 DIAGNOSIS — C770 Secondary and unspecified malignant neoplasm of lymph nodes of head, face and neck: Secondary | ICD-10-CM | POA: Diagnosis not present

## 2016-01-12 DIAGNOSIS — D630 Anemia in neoplastic disease: Secondary | ICD-10-CM

## 2016-01-12 DIAGNOSIS — G893 Neoplasm related pain (acute) (chronic): Secondary | ICD-10-CM

## 2016-01-12 DIAGNOSIS — D696 Thrombocytopenia, unspecified: Secondary | ICD-10-CM

## 2016-01-12 DIAGNOSIS — C50919 Malignant neoplasm of unspecified site of unspecified female breast: Secondary | ICD-10-CM

## 2016-01-12 LAB — COMPREHENSIVE METABOLIC PANEL
ALBUMIN: 2.7 g/dL — AB (ref 3.5–5.0)
ALT: 18 U/L (ref 0–55)
AST: 26 U/L (ref 5–34)
Alkaline Phosphatase: 152 U/L — ABNORMAL HIGH (ref 40–150)
Anion Gap: 7 mEq/L (ref 3–11)
BUN: 7.7 mg/dL (ref 7.0–26.0)
CHLORIDE: 104 meq/L (ref 98–109)
CO2: 29 mEq/L (ref 22–29)
CREATININE: 0.7 mg/dL (ref 0.6–1.1)
Calcium: 8.4 mg/dL (ref 8.4–10.4)
EGFR: >90 mL/min/{1.73_m2} (ref >90)
GLUCOSE: 126 mg/dL (ref 70–140)
POTASSIUM: 3.7 meq/L (ref 3.5–5.1)
SODIUM: 140 meq/L (ref 136–145)
Total Bilirubin: 0.93 mg/dL (ref 0.20–1.20)
Total Protein: 4.8 g/dL — ABNORMAL LOW (ref 6.4–8.3)

## 2016-01-12 LAB — CBC WITH DIFFERENTIAL/PLATELET
BASO%: 0.1 % (ref 0.0–2.0)
BASOS ABS: 0 10*3/uL (ref 0.0–0.1)
EOS%: 0 % (ref 0.0–7.0)
Eosinophils Absolute: 0 10*3/uL (ref 0.0–0.5)
HEMATOCRIT: 28.4 % — AB (ref 34.8–46.6)
HEMOGLOBIN: 9.2 g/dL — AB (ref 11.6–15.9)
LYMPH%: 7.6 % — AB (ref 14.0–49.7)
MCH: 33.8 pg (ref 25.1–34.0)
MCHC: 32.4 g/dL (ref 31.5–36.0)
MCV: 104.4 fL — ABNORMAL HIGH (ref 79.5–101.0)
MONO#: 1.1 10*3/uL — ABNORMAL HIGH (ref 0.1–0.9)
MONO%: 12.2 % (ref 0.0–14.0)
NEUT%: 80.1 % — ABNORMAL HIGH (ref 38.4–76.8)
NEUTROS ABS: 7.2 10*3/uL — AB (ref 1.5–6.5)
Platelets: 22 10*3/uL — ABNORMAL LOW (ref 145–400)
RBC: 2.72 10*6/uL — ABNORMAL LOW (ref 3.70–5.45)
RDW: 23.8 % — AB (ref 11.2–14.5)
WBC: 8.9 10*3/uL (ref 3.9–10.3)
lymph#: 0.7 10*3/uL — ABNORMAL LOW (ref 0.9–3.3)
nRBC: 0 % (ref 0–0)

## 2016-01-12 MED ORDER — METHADONE HCL 10 MG PO TABS: 10.0000 mg | ORAL_TABLET | Freq: Three times a day (TID) | ORAL | Status: DC

## 2016-01-12 MED ORDER — SODIUM CHLORIDE 0.9% FLUSH
10.0000 mL | INTRAVENOUS | Status: DC | PRN
  Administered 2016-01-12: 10 mL via INTRAVENOUS
  Filled 2016-01-12: qty 10

## 2016-01-12 NOTE — Telephone Encounter (Signed)
Appointments made and avs printed for pateint °

## 2016-01-12 NOTE — Patient Instructions (Signed)

## 2016-01-12 NOTE — Progress Notes (Signed)
ID: Kristin Hoffman OB: November 23, 1966  MR#: [id number]  HQP#:[id number]  PCP: Drake Leach, MD GYN:  [name] SU: [name] OTHER MD: [name], [name]  CHIEF COMPLAINT:  Stage IV breast cancer  CURRENT TREATMENT: Liposomal doxorubicin  BREAST CANCER HISTORY: This patient was previously followed by [name], and was transferred to [name] service as of 08/20/2013.  At the age of 50, the patient had a screening mammogram as a baseline. She had recently given birth and was on birth control pills at that time. The mammogram in November 2006 showed an area of architectural distortion in the left lower inner quadrant. Subsequently an ultrasound was obtained of the left breast showing a 2.0 x 1.5 x 1.4 cm mass, at the 8:00 position, 4 cm from the nipple. A core biopsy performed on 10/21/2005 showed an invasive mammary carcinoma, ER +70%, PR +41%, HER-2/neu negative, with proliferation marker of 38%. [phone])  Breast MRI on 11/08/2005 confirmed a 2.5 cm spiculated mass in the left lower inner quadrant with 3 small satellite nodules adjacent to the primary mass: 3.5 cm posterior medial to the primary mass, measuring 9 mm; 1.5 cm anterior medial to the primary mass measuring 5 mm; and 2 cm medial to the primary mass measuring 5 mm. No axillary adenopathy was noted. No suspicious masses or enhancement are noted in the right breast.  Malcolm underwentt left lumpectomy under the care of Dr. 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 2 of 23 lymph nodes were involved.  Margins were clear.  She received adjuvant chemotherapy consisting of 6 q. three-week doses of docetaxel/doxorubicin/cyclophosphamide given between January 2007 and may of 2007, with last dose on 04/20/2006. She underwent radiation therapy completed 07/11/2006, after which she began on tamoxifen in early August  2007.  She underwent hysterectomy with bilateral salpingo-oophorectomy on 07/15/2008, and was started on letrozole in October of 2009.  A mammogram 11/10/2009 showed microcalcifications in the left breast, and a subsequent biopsy on 11/18/2009 confirmed invasive mammary carcinoma in the left breast. PET and CT of the chest showed no evidence of metastasis in January 2011.  She underwent bilateral mastectomies 01/25/2010, with the right breast showing no evidence of malignancy; in the left breast showing a 1.0 cm grade 2 invasive ductal carcinoma with high-grade DCIS. Tumor was ER +22%, PR negative, HER-2/neu negative, with proliferation marker of 13%. Margins were clear. Patient underwent concurrent left latissimus flap reconstruction and right implant reconstruction.  She received additional chemotherapy with one cycle of carboplatin/gemcitabine given on 03/11/2010. She then received 5 q. three-week cycles of IV CMF between 03/25/2010 and 06/17/2010.  She was started on exemestane in January 2012 and continued until late November 2013 when she was hospitalized for apparent TIA.   Her subsequent history is as detailed below  INTERVAL HISTORY: [name] returns today for follow up of her triple negative breast cancer. Today is day 22, cycle 2 of doxil, with neulasta onpro given for granulocyte support on day 2. She is being treated every 4 weeks instead of the every 3 weeks originally planned, because of counts, functional status, and mucositis concerns.  REVIEW OF SYSTEMS: Kristin Hoffman is not feeling well today. She has tried using percocet and tylenol more for her pain but this upsets her stomach. This week she has had more reflux despite antacid and prilosec use. She is constipated and bloated. She increased her tramadol  up to 162m 3-4 times daily, and this is also ineffective. Her pain is along the left flank and back. This is present on the right as well but to a lesser degree. She has discomfort to the groin  intermittently. The pain is especially present with deep breaths. This in turn makes her more short of breath because she purposely tried to breath shallowly. She uses 2LO2 at night. She does not need it during the day. Her energy level is better since her blood transfusion, and she has no more petechiae from a lack of platelets since these were replaced as well. She denies bleeding or blood in her stool or urine. She denies fevers, chills, nausea, or vomiting. She is becoming anxious that the chemo is not working and she is not sleeping well. She has no headaches, dizziness, or vision changes. She continues on diflucan for thrush. Her mouth is no longer sore however. A detailed review of system is otherwise stable.   PAST MEDICaL HISTORY: Past Medical History  Diagnosis Date  . Depression   . Reflux   . Hx-TIA (transient ischemic attack) 11/22/2013  . Chronic headaches 11/22/2013  . Anxiety 11/22/2013  [place]   [place] [place]   . Breast cancer metastasized to multiple sites ([facility] 12/24/2013    PAST SURGICAL HISTORY: Past Surgical History  Procedure Laterality Date  . Mastectomy w/ nodes partial    . Mastectomy    . Breast reconstruction    . Portacath placement      x 2  . Portacath removal      x 2  . Abdominal hysterectomy    . Tee without cardioversion  11/12/2012    Procedure: TRANSESOPHAGEAL ECHOCARDIOGRAM (TEE);  Surgeon: MCandee Furbish MD;  [facility];  Service: Cardiovascular;  Laterality: N/A;  This TEE may be Dr. SMarlou Porchor a [facility] Dr    FAMILY HISTORY Both parents are alive and well. Patient has one sister who is 17years younger and is in good health. No other history of breast or ovarian cancer in the family. Family History  Problem Relation Age of Onset  . Diabetes Mellitus II Neg Hx   . Hypertension Neg Hx     GYNECOLOGIC HISTORY:   (Updated January 2015) G2P2, menarche at age [age]with irregular menses. On birth control pills in the past. On  Clomid to induce ovulation with first pregnancy. Also had preeclampsia with first pregnancy. Status post hysterectomy and bilateral salpingo-oophorectomy in August 2009.  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 He works as an ePharmacist, hospitalat [employer]   ADVANCED DIRECTIVES:  Not in place. At the clinic visit 01/12/2016 the patient was given the appropriate forms to complete and notarize at her discretion.    HEALTH MAINTENANCE:  (Updated 11/22/2013) Social History  Substance Use Topics  . Smoking status: Never Smoker   . Smokeless tobacco: Never Used  . Alcohol Use: Yes     Comment: rarely     Colonoscopy:  Never  PAP: S/P LAVH/BSO in August 2009  Bone density: Never  Lipid panel: Not on file   Allergies  Allergen Reactions  . Afinitor [Everolimus] Hives and Itching    Current Outpatient Prescriptions  Medication Sig Dispense Refill  . acidophilus (RISAQUAD) CAPS capsule Take 1 capsule by  mouth every morning.     . Biotin 5000 MCG CAPS Take 5,000 mcg by mouth every morning.  30 capsule   . cetirizine (ZYRTEC) 10 MG tablet Take 10 mg by mouth at bedtime.     . Cholecalciferol 2000 UNITS CHEW Chew 1 tablet by mouth daily with breakfast.     . escitalopram (LEXAPRO) 20 MG tablet Take 1 tablet (20 mg total) by mouth daily. 30 tablet 12  . fluconazole (DIFLUCAN) 100 MG tablet Take 1 tablet (100 mg total) by mouth daily. 30 tablet 1  . LORazepam (ATIVAN) 0.5 MG tablet Take one tablet at bedtime as needed for sleep 120 tablet 1  . Melatonin 10 MG TABS Take 1 tablet by mouth at bedtime.    . montelukast (SINGULAIR) 10 MG tablet Take 10 mg by mouth daily with breakfast.     . Multiple Vitamin (MULTIVITAMIN) tablet Take 1 tablet by mouth every morning.     Marland Kitchen omeprazole (PRILOSEC) 40 MG capsule Take 40 mg by  mouth every morning.     . ondansetron (ZOFRAN) 8 MG tablet TAKE 1 TABLET BY MOUTH 2 TIMES DAILY. START DAY AFTER CHEMO FOR 2 DAYS. THEN TAKE AS NEEDED NAUSEA 30 tablet 1  . oxyCODONE-acetaminophen (PERCOCET/ROXICET) 5-325 MG tablet Take 1 tablet by mouth every 4 (four) hours as needed for severe pain. 60 tablet 0  . traMADol (ULTRAM) 50 MG tablet Take 1-2 tablets (50-100 mg total) by mouth every 6 (six) hours as needed. 120 tablet 1  . valACYclovir (VALTREX) 1000 MG tablet Take 1 tablet (1,000 mg total) by mouth daily. 60 tablet 4  . diphenoxylate-atropine (LOMOTIL) 2.5-0.025 MG tablet TAKE 2 TABLETS BY MOUTH 4 TIMES A DAY AS NEEDED (Patient not taking: Reported on 12/22/2015) 30 tablet 0  . Ibuprofen (ADVIL) 200 MG CAPS Take 2 capsules (400 mg total) by mouth 3 (three) times daily with meals as needed. (Patient not taking: Reported on 01/12/2016) 120 each 0  . methadone (DOLOPHINE) 10 MG tablet Take 1 tablet (10 mg total) by mouth every 8 (eight) hours. 21 tablet 0  . nystatin (MYCOSTATIN) 100000 UNIT/ML suspension Take 5 mLs (500,000 Units total) by mouth 4 (four) times daily. (Patient not taking: Reported on 01/12/2016) 240 mL 1  . prochlorperazine (COMPAZINE) 10 MG tablet Take 10 mg by mouth every 6 (six) hours as needed for nausea or vomiting. Reported on 01/12/2016     No current facility-administered medications for this visit.    OBJECTIVE: Young white woman who appears pale Filed Vitals:   01/12/16 1314  BP: 123/72  Pulse: 88  Temp: 97.7 F (36.5 C)  Resp: 18  Body mass index is 30.37 kg/(m^2).  ECOG: 2 Filed Weights   01/12/16 1314  Weight: 171 lb 6.4 oz (77.747 kg)   Skin: warm, dry  HEENT: sclerae anicteric, conjunctivae pink, oropharynx clear. No thrush or mucositis.  Lymph Nodes: No cervical or supraclavicular lymphadenopathy  Lungs: clear to auscultation bilaterally, no rales, wheezes, or rhonci  Heart: regular rate and rhythm  Abdomen: round, soft, non tender, positive  bowel sounds  Musculoskeletal: No focal spinal tenderness, no peripheral edema  Neuro: non focal, well oriented, positive affect  Breasts; deferred  LAB RESULTS:  CBC Latest Ref Rng 01/12/2016 01/05/2016 12/22/2015  WBC 3.9 - 10.3 10e3/uL 8.9 0.5(LL) 2.1(L)  Hemoglobin 11.6 - 15.9 g/dL 9.2(L) 8.7(L) 11.6  Hematocrit 34.8 - 46.6 % 28.4(L) 26.5(L) 36.5  Platelets 145 - 400 10e3/uL 22(L) 8(LL) 67(L)   CMP  Latest Ref Rng 01/12/2016 01/05/2016 12/22/2015  Glucose 70 - 140 mg/dl 126 114 112  BUN 7.0 - 26.0 mg/dL 7.7 14.0 7.5  Creatinine 0.6 - 1.1 mg/dL 0.7 0.7 0.7  Sodium 136 - 145 mEq/L 140 140 142  Potassium 3.5 - 5.1 mEq/L 3.7 3.8 3.5  Chloride 101 - 111 mmol/L - - -  CO2 22 - 29 mEq/L 29 30(H) 27  Calcium 8.4 - 10.4 mg/dL 8.4 8.8 8.2(L)  Total Protein 6.4 - 8.3 g/dL 4.8(L) 5.1(L) 5.1(L)  Total Bilirubin 0.20 - 1.20 mg/dL 0.93 1.63(H) 1.35(H)  Alkaline Phos 40 - 150 U/L 152(H) 116 145  AST 5 - 34 U/L 26 26 37(H)  ALT 0 - 55 U/L 18 16 19     STUDIES: No results found.    ASSESSMENT: 50 y.o. [place] woman  (1)  status post left lumpectomy under the care of Dr. 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 2 of 23 lymph nodes were involved.  Margins were clear.  (2) status post adjuvant chemotherapy consisting of 6 q. three-week doses of docetaxel/ doxorubicin/ cyclophosphamide given between January 2007 and may of 2007, with last dose on 04/20/2006.  (3) status post radiation therapy to the left breast, completed 07/11/2006  (4) began on tamoxifen in early August 2007 and continued until October 2009. She status post hysterectomy with bilateral salpingo-oophorectomy on 07/15/2008, and was started on letrozole in October of 2009.  (5) mammogram 11/10/2009 showed microcalcifications in the left breast, and a subsequent biopsy on 11/18/2009 confirmed invasive mammary carcinoma in the left  breast. PET and CT of the chest showed no evidence of metastasis in January 2011.  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 Margins were clear. Patient underwent concurrent left latissimus flap reconstruction and right implant reconstruction.  (7)  status post additional chemotherapy with one cycle of carboplatin/gemcitabine given on 03/11/2010. She then received 5 q. three-week cycles of IV CMF between 03/25/2010 and 06/17/2010.  (8) started on exemestane in January 2012 and continued until late November 2013 when she was hospitalized for apparent TIA at which time her exemestane was stopped and not resumed  (9) METASTATIC DISEASE: evidence of disease recurence noted on chest CT, liver MRI and PET scan in December 2014, with suspicious lung nodules, lymphadenopathy, and 3 lesions in the right lobe of the liver, but no evidence of bony disease and no evidence of brain involvement by brain MRI September 2014. Biopsy of a left supraclavicular lymph node on 12/11/2013 confirmed metastatic carcinoma, estrogen receptor 45% positive, progesterone receptor 17% positive, with no HER-2 amplification.  (10) fulvestrant started 12/03/2013, discontinued 03/25/2014 with progression  (11) exemestane/ everolimus started 04/04/2014; everolimus held 04/14/2014, with skin rash and mouth soreness; resumed at 5 mg a day as of 04/29/2014, discontinued 05/09/2014 with similar symptoms  (12) continuing exemestane, adding palbociclib 05/26/2014;   (a) palbociclib dose dropped to 75 mg every other day for 21 days beginning with cycle 4  (b) exemestane and Palbociclib discontinued December 2015 with evidence of progression  (13) [facility] referral placed 04/28/2049 -- re-sent 05/23/2014-- shows Pi3KCA and TP53  mutations  (a) [facility] one test requested 12/10/2014-- confirms [employer] results  (b) did not qualify for capecitabine/ BYL 719  trial because of elevated lipase  (14) started eribulin 12/26/2014, 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   (a) dpse dropped from 2g BID to 1.5g BID starting on 04/26/15  (b) stopped 05/17/2015 (after 4 cycles) with progression  (16) started carboplatin/ gemcitabine 05/26/2015, given day one and day 8 of each 21 day cycle for a total of 8 cycles, last dose 11/10/2015, stopped because of persistent cytopenias  (a) given her history of severe neutropenia with this chemotherapy we'll do Neupogen days 2, 3 and 4 of each cycle and onpro day 9  (b) carbo dose dropped by 25% w cycle 2 due to very low platelet nadir  (c) CT scans after 3 cycles (07/27/2015) show measurable response  (d) CT scans after 6 cycles (09/28/2015)show continuing response  (e) CT scans post-treatment (11/23/2015) shows continuing response  (17) Left pleural effusion\   (a) s/p L thoracentesis 05/22/2015, 05/29/2015 and 06/05/2015  (b) attempt at pleurx 06/12/2015 cancelled as effusion loculated and scant  (18) symptomatic anemia  (a) Aranesp started 07/28/2015, retic <1% on 10/06/2015  (b) Feraheme given 08/04/2015; ferritin >2K on 10/06/2015, last dose on 11/1  (19) liposomal doxorubicin started 11/24/2015, repeated every 21 days w onpro support  (a) echo 03/23/2015 shows an EF of 55%  PLAN:  Milika is anxious today. She believes the treatment is not working, but understands she needs to complete at least 1 more cycle before we perform restaging scans. The labs were reviewed in detail. Since her transfusions last week, her hgb is up to 9.2 and her platelets are 22. Her ANC has rebounded to 7.2 with the help of  neulasta.   Her main complaint today is increased pain. We discussed adding a long acting pain reliever such as MS Contin or methadone. We settled on methadone 106m TID. I have given her a small quantity, just to last a week. When she visits next Tuesday she will report back on its efficacy, and if necessary we will initiate MS Contin.   Sharion will return in 1 week for cycle 3 of doxil. She understands and agrees with this plan. She knows the goal of treatment in her case is control. She has been encouraged to call with any issues that might arise before her next visit here.    HLaurie Panda NP   01/12/2016 2:28 PM

## 2016-01-13 ENCOUNTER — Ambulatory Visit (HOSPITAL_COMMUNITY)
Admission: RE | Admit: 2016-01-13 | Discharge: 2016-01-13 | Disposition: A | Discharge status: Home / Self Care | Payer: 59 | Source: Ambulatory Visit | Attending: Oncology | Admitting: Oncology

## 2016-01-13 DIAGNOSIS — D649 Anemia, unspecified: Secondary | ICD-10-CM | POA: Insufficient documentation

## 2016-01-19 ENCOUNTER — Ambulatory Visit: Payer: 59

## 2016-01-19 ENCOUNTER — Ambulatory Visit (HOSPITAL_BASED_OUTPATIENT_CLINIC_OR_DEPARTMENT_OTHER): Payer: 59

## 2016-01-19 ENCOUNTER — Telehealth: Payer: Self-pay | Admitting: Oncology

## 2016-01-19 ENCOUNTER — Ambulatory Visit (HOSPITAL_BASED_OUTPATIENT_CLINIC_OR_DEPARTMENT_OTHER): Payer: 59 | Admitting: Oncology

## 2016-01-19 ENCOUNTER — Other Ambulatory Visit (HOSPITAL_BASED_OUTPATIENT_CLINIC_OR_DEPARTMENT_OTHER): Payer: 59

## 2016-01-19 ENCOUNTER — Other Ambulatory Visit: Payer: 59

## 2016-01-19 VITALS — BP 128/68 | HR 102 | Temp 98.4°F | Resp 18 | Ht 63.0 in | Wt 169.3 lb

## 2016-01-19 DIAGNOSIS — C50312 Malignant neoplasm of lower-inner quadrant of left female breast: Secondary | ICD-10-CM

## 2016-01-19 DIAGNOSIS — D702 Other drug-induced agranulocytosis: Secondary | ICD-10-CM | POA: Diagnosis not present

## 2016-01-19 DIAGNOSIS — Z8673 Personal history of transient ischemic attack (TIA), and cerebral infarction without residual deficits: Secondary | ICD-10-CM | POA: Diagnosis not present

## 2016-01-19 DIAGNOSIS — C50912 Malignant neoplasm of unspecified site of left female breast: Secondary | ICD-10-CM

## 2016-01-19 DIAGNOSIS — C50919 Malignant neoplasm of unspecified site of unspecified female breast: Secondary | ICD-10-CM

## 2016-01-19 DIAGNOSIS — Z5189 Encounter for other specified aftercare: Secondary | ICD-10-CM

## 2016-01-19 DIAGNOSIS — Z5111 Encounter for antineoplastic chemotherapy: Secondary | ICD-10-CM

## 2016-01-19 DIAGNOSIS — C787 Secondary malignant neoplasm of liver and intrahepatic bile duct: Secondary | ICD-10-CM | POA: Diagnosis not present

## 2016-01-19 DIAGNOSIS — D696 Thrombocytopenia, unspecified: Secondary | ICD-10-CM | POA: Diagnosis not present

## 2016-01-19 DIAGNOSIS — Z95828 Presence of other vascular implants and grafts: Secondary | ICD-10-CM

## 2016-01-19 LAB — COMPREHENSIVE METABOLIC PANEL
ALT: 18 U/L (ref 0–55)
AST: 32 U/L (ref 5–34)
Albumin: 2.6 g/dL — ABNORMAL LOW (ref 3.5–5.0)
Alkaline Phosphatase: 126 U/L (ref 40–150)
Anion Gap: 5 mEq/L (ref 3–11)
BUN: 7 mg/dL (ref 7.0–26.0)
CO2: 30 mEq/L — ABNORMAL HIGH (ref 22–29)
Calcium: 8.2 mg/dL — ABNORMAL LOW (ref 8.4–10.4)
Chloride: 105 mEq/L (ref 98–109)
Creatinine: 0.7 mg/dL (ref 0.6–1.1)
EGFR: >90 mL/min/{1.73_m2} (ref >90)
Glucose: 128 mg/dl (ref 70–140)
Potassium: 3.9 mEq/L (ref 3.5–5.1)
Sodium: 140 mEq/L (ref 136–145)
Total Bilirubin: 1.14 mg/dL (ref 0.20–1.20)
Total Protein: 4.8 g/dL — ABNORMAL LOW (ref 6.4–8.3)

## 2016-01-19 LAB — CBC WITH DIFFERENTIAL/PLATELET
BASO%: 0.7 % (ref 0.0–2.0)
Basophils Absolute: 0 10*3/uL (ref 0.0–0.1)
EOS%: 0.1 % (ref 0.0–7.0)
Eosinophils Absolute: 0 10*3/uL (ref 0.0–0.5)
HCT: 30.8 % — ABNORMAL LOW (ref 34.8–46.6)
HGB: 10 g/dL — ABNORMAL LOW (ref 11.6–15.9)
LYMPH#: 0.4 10*3/uL — AB (ref 0.9–3.3)
LYMPH%: 9.5 % — ABNORMAL LOW (ref 14.0–49.7)
MCH: 35.2 pg — ABNORMAL HIGH (ref 25.1–34.0)
MCHC: 32.5 g/dL (ref 31.5–36.0)
MCV: 108.4 fL — ABNORMAL HIGH (ref 79.5–101.0)
MONO#: 0.5 10*3/uL (ref 0.1–0.9)
MONO%: 12.6 % (ref 0.0–14.0)
NEUT%: 77.1 % — ABNORMAL HIGH (ref 38.4–76.8)
NEUTROS ABS: 3.2 10*3/uL (ref 1.5–6.5)
PLATELETS: 59 10*3/uL — AB (ref 145–400)
RBC: 2.84 10*6/uL — AB (ref 3.70–5.45)
RDW: 28 % — AB (ref 11.2–14.5)
WBC: 4.1 10*3/uL (ref 3.9–10.3)

## 2016-01-19 MED ORDER — DEXTROSE 5 % IV SOLN
35.0000 mg/m2 | Freq: Once | INTRAVENOUS | Status: AC
  Administered 2016-01-19: 66 mg via INTRAVENOUS
  Filled 2016-01-19: qty 8

## 2016-01-19 MED ORDER — SODIUM CHLORIDE 0.9% FLUSH
10.0000 mL | INTRAVENOUS | Status: DC | PRN
  Administered 2016-01-19: 10 mL via INTRAVENOUS
  Filled 2016-01-19: qty 10

## 2016-01-19 MED ORDER — DIPHENHYDRAMINE HCL 50 MG/ML IJ SOLN
25.0000 mg | Freq: Once | INTRAMUSCULAR | Status: AC
  Administered 2016-01-19: 25 mg via INTRAVENOUS

## 2016-01-19 MED ORDER — FAMOTIDINE IN NACL 20-0.9 MG/50ML-% IV SOLN
20.0000 mg | Freq: Two times a day (BID) | INTRAVENOUS | Status: DC
  Administered 2016-01-19: 20 mg via INTRAVENOUS

## 2016-01-19 MED ORDER — SODIUM CHLORIDE 0.9 % IJ SOLN
10.0000 mL | INTRAMUSCULAR | Status: DC | PRN
  Administered 2016-01-19: 10 mL
  Filled 2016-01-19: qty 10

## 2016-01-19 MED ORDER — SODIUM CHLORIDE 0.9 % IV SOLN
Freq: Once | INTRAVENOUS | Status: AC
  Administered 2016-01-19: 15:00:00 via INTRAVENOUS
  Filled 2016-01-19: qty 4

## 2016-01-19 MED ORDER — HEPARIN SOD (PORK) LOCK FLUSH 100 UNIT/ML IV SOLN
500.0000 [IU] | Freq: Once | INTRAVENOUS | Status: AC | PRN
  Administered 2016-01-19: 500 [IU]
  Filled 2016-01-19: qty 5

## 2016-01-19 MED ORDER — FAMOTIDINE IN NACL 20-0.9 MG/50ML-% IV SOLN
INTRAVENOUS | Status: AC
  Filled 2016-01-19: qty 50

## 2016-01-19 MED ORDER — DEXTROSE 5 % IV SOLN
Freq: Once | INTRAVENOUS | Status: AC
  Administered 2016-01-19: 14:00:00 via INTRAVENOUS

## 2016-01-19 MED ORDER — METHADONE HCL 5 MG PO TABS: 5.0000 mg | ORAL_TABLET | Freq: Three times a day (TID) | ORAL | Status: DC

## 2016-01-19 MED ORDER — PEGFILGRASTIM 6 MG/0.6ML ~~LOC~~ PSKT
6.0000 mg | PREFILLED_SYRINGE | Freq: Once | SUBCUTANEOUS | Status: AC
  Administered 2016-01-19: 6 mg via SUBCUTANEOUS
  Filled 2016-01-19: qty 0.6

## 2016-01-19 MED ORDER — SODIUM CHLORIDE 0.9 % IV SOLN: Freq: Once | INTRAVENOUS | Status: DC

## 2016-01-19 MED ORDER — DIPHENHYDRAMINE HCL 50 MG/ML IJ SOLN
INTRAMUSCULAR | Status: AC
  Filled 2016-01-19: qty 1

## 2016-01-19 NOTE — Progress Notes (Signed)
ID: [name] OB: December 06, 1966  MR#: [id number]  YDX#:[id number]  PCP: Drake Leach, MD GYN:  [name] SU: [name] OTHER MD: [name], [name]  CHIEF COMPLAINT:  Stage IV breast cancer  CURRENT TREATMENT: Liposomal doxorubicin  BREAST CANCER HISTORY: This patient was previously followed by [name], and was transferred to [name] service as of 08/20/2013.  At the age of 50, the patient had a screening mammogram as a baseline. She had recently given birth and was on birth control pills at that time. The mammogram in November 2006 showed an area of architectural distortion in the left lower inner quadrant. Subsequently an ultrasound was obtained of the left breast showing a 2.0 x 1.5 x 1.4 cm mass, at the 8:00 position, 4 cm from the nipple. A core biopsy performed on 10/21/2005 showed an invasive mammary carcinoma, ER +70%, PR +41%, HER-2/neu negative, with proliferation marker of 38%. [phone])  Breast MRI on 11/08/2005 confirmed a 2.5 cm spiculated mass in the left lower inner quadrant with 3 small satellite nodules adjacent to the primary mass: 3.5 cm posterior medial to the primary mass, measuring 9 mm; 1.5 cm anterior medial to the primary mass measuring 5 mm; and 2 cm medial to the primary mass measuring 5 mm. No axillary adenopathy was noted. No suspicious masses or enhancement are noted in the right breast.  Marabella underwentt left lumpectomy under the care of Dr. 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 2 of 23 lymph nodes were involved.  Margins were clear.  She received adjuvant chemotherapy consisting of 6 q. three-week doses of docetaxel/doxorubicin/cyclophosphamide given between January 2007 and may of 2007, with last dose on 04/20/2006. She underwent radiation therapy completed 07/11/2006, after which she began on tamoxifen in early August  2007.  She underwent hysterectomy with bilateral salpingo-oophorectomy on 07/15/2008, and was started on letrozole in October of 2009.  A mammogram 11/10/2009 showed microcalcifications in the left breast, and a subsequent biopsy on 11/18/2009 confirmed invasive mammary carcinoma in the left breast. PET and CT of the chest showed no evidence of metastasis in January 2011.  She underwent bilateral mastectomies 01/25/2010, with the right breast showing no evidence of malignancy; in the left breast showing a 1.0 cm grade 2 invasive ductal carcinoma with high-grade DCIS. Tumor was ER +22%, PR negative, HER-2/neu negative, with proliferation marker of 13%. Margins were clear. Patient underwent concurrent left latissimus flap reconstruction and right implant reconstruction.  She received additional chemotherapy with one cycle of carboplatin/gemcitabine given on 03/11/2010. She then received 5 q. three-week cycles of IV CMF between 03/25/2010 and 06/17/2010.  She was started on exemestane in January 2012 and continued until late November 2013 when she was hospitalized for apparent TIA.   Her subsequent history is as detailed below  INTERVAL HISTORY: Klee returns today for follow up of her  Stage IV breast cancer. Today is day 1, cycle 3 of doxil, with onpro given for granulocyte support on day 2,  Repeated every 4 weeks.  REVIEW OF SYSTEMS: Lillis  Has not had any other problems with mucositis. She does continue to have chronic pain, which is "all over her back and front", and she did start methadone 10 mg 3 times a day. She said it took care of the pain just fine but it made her very loopy. Her children were very M used. She couldn't drive  consent was not safe. She stopped the medication and went back to the occasional oxycodone, but that upsets her stomach and doesn't really take care of the pain. She also tried increasing the tramadol 200 mg 4 times a day. That made her loopy and it did not control the pain.  Aside from these issues, she is moderately constipated. She is concern about both gaining and losing weight. She remains severely fatigued and after an onpro dose she sleeps for 4 days, she says. She occasionally has blurred vision. Even though she has no soreness in her mouth or throat she still finds it difficult to swallow and sometimes food just comes back up. She remains short of breath. She bruises easily. She feels very forgetful. It detailed review of systems today was otherwise stable.  PAST MEDICaL HISTORY: Past Medical History  Diagnosis Date  . Depression   . Reflux   . Hx-TIA (transient ischemic attack) 11/22/2013  . Chronic headaches 11/22/2013  . Anxiety 11/22/2013  . Breast cancer ([place])   . Cancer ([place])   . Breast cancer metastasized to multiple sites [facility]) 12/24/2013    PAST SURGICAL HISTORY: Past Surgical History  Procedure Laterality Date  . Mastectomy w/ nodes partial    . Mastectomy    . Breast reconstruction    . Portacath placement      x 2  . Portacath removal      x 2  . Abdominal hysterectomy    . Tee without cardioversion  11/12/2012    Procedure: TRANSESOPHAGEAL ECHOCARDIOGRAM (TEE);  Surgeon: Candee Furbish, MD;  Location: [facility];  Service: Cardiovascular;  Laterality: N/A;  This TEE may be Dr. Marlou Porch or a LHC Dr    FAMILY HISTORY Both parents are alive and well. Patient has one sister who is 3 years younger and is in good health. No other history of breast or ovarian cancer in the family. Family History  Problem Relation Age of Onset  . Diabetes Mellitus II Neg Hx   . Hypertension Neg Hx     GYNECOLOGIC HISTORY:   (Updated January 2015) G2P2, menarche at age 18 with irregular menses. On birth control pills in the past. On Clomid to induce ovulation with first pregnancy. Also had preeclampsia with first pregnancy. Status post hysterectomy and bilateral salpingo-oophorectomy in August 2009.  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 She is originally from [place]. She's been married to [place], for 13 years. He works as an Pharmacist, hospital at [employer].   ADVANCED DIRECTIVES:  Not in place. At  A prior clinic visit the patient was given the appropriate forms to complete and notarize at her discretion.    HEALTH MAINTENANCE:  (Updated 11/22/2013) Social History  Substance Use Topics  . Smoking status: Never Smoker   . Smokeless tobacco: Never Used  . Alcohol Use: Yes     Comment: rarely     Colonoscopy:  Never  PAP: S/P LAVH/BSO in August 2009  Bone density: Never  Lipid panel: Not on file   Allergies  Allergen Reactions  . Afinitor [Everolimus] Hives and Itching    Current Outpatient Prescriptions  Medication Sig Dispense Refill  . acidophilus (RISAQUAD) CAPS capsule Take 1 capsule by mouth every morning.     . Biotin 5000 MCG CAPS Take 5,000 mcg by mouth every morning.  30 capsule   . cetirizine (ZYRTEC) 10 MG tablet  Take 10 mg by mouth at bedtime.     . Cholecalciferol 2000 UNITS CHEW Chew 1 tablet by mouth daily with breakfast.     . diphenoxylate-atropine (LOMOTIL) 2.5-0.025 MG tablet TAKE 2 TABLETS BY MOUTH 4 TIMES A DAY AS NEEDED (Patient not taking: Reported on 12/22/2015) 30 tablet 0  . escitalopram (LEXAPRO) 20 MG tablet Take 1 tablet (20 mg total) by mouth daily. 30 tablet 12  . fluconazole (DIFLUCAN) 100 MG tablet Take 1 tablet (100 mg total) by mouth daily. 30 tablet 1  . Ibuprofen (ADVIL) 200 MG CAPS Take 2 capsules (400 mg total) by mouth 3 (three) times daily with meals as needed. (Patient not taking: Reported on 01/12/2016) 120 each 0  . LORazepam (ATIVAN) 0.5 MG tablet Take one tablet at bedtime as needed for sleep 120 tablet 1  . LOTEMAX 0.5 % ophthalmic suspension Place 1 drop into both eyes 4 (four) times daily. Week 1& 2 - Instill 4 drops per day both eyes Week 3& 4-  Instill 2 drops per day both eyes   plus Soothe XP 2 drops per day both eyes    . Melatonin 10 MG TABS Take 1 tablet by mouth at bedtime.    . methadone (DOLOPHINE) 10 MG tablet Take 1 tablet (10 mg total) by mouth every 8 (eight) hours. 21 tablet 0  . montelukast (SINGULAIR) 10 MG tablet Take 10 mg by mouth daily with breakfast.     . Multiple Vitamin (MULTIVITAMIN) tablet Take 1 tablet by mouth every morning.     . nystatin (MYCOSTATIN) 100000 UNIT/ML suspension Take 5 mLs (500,000 Units total) by mouth 4 (four) times daily. (Patient not taking: Reported on 01/12/2016) 240 mL 1  . omeprazole (PRILOSEC) 40 MG capsule Take 40 mg by mouth every morning.     . ondansetron (ZOFRAN) 8 MG tablet TAKE 1 TABLET BY MOUTH 2 TIMES DAILY. START DAY AFTER CHEMO FOR 2 DAYS. THEN TAKE AS NEEDED NAUSEA 30 tablet 1  . oxyCODONE-acetaminophen (PERCOCET/ROXICET) 5-325 MG tablet Take 1 tablet by mouth every 4 (four) hours as needed for severe pain. 60 tablet 0  . prochlorperazine (COMPAZINE) 10 MG tablet Take 10 mg by mouth every 6 (six) hours as needed for nausea or vomiting. Reported on 01/12/2016    . traMADol (ULTRAM) 50 MG tablet Take 1-2 tablets (50-100 mg total) by mouth every 6 (six) hours as needed. 120 tablet 1  . valACYclovir (VALTREX) 1000 MG tablet Take 1 tablet (1,000 mg total) by mouth daily. 60 tablet 4   No current facility-administered medications for this visit.    OBJECTIVE: Young white woman  Filed Vitals:   01/19/16 1257  BP: 128/68  Pulse: 102  Temp: 98.4 F (36.9 C)  Resp: 18  Body mass index is 30 kg/(m^2).  ECOG: 2 Filed Weights   01/19/16 1257  Weight: 169 lb 4.8 oz (76.794 kg)   Sclerae unicteric, EOMs intact Oropharynx clear, dentition in good repair No cervical or supraclavicular adenopathy; specifically no left supraclavicular swelling or massLungs no rales or rhonchi Heart regular rate and rhythm Abd soft, nontender, positive bowel sounds MSK no focal spinal tenderness, no upper extremity lymphedema Neuro:  nonfocal, well oriented, appropriate affect Breasts:  deferred  LAB RESULTS:  CBC Latest Ref Rng 01/19/2016 01/12/2016 01/05/2016  WBC 3.9 - 10.3 10e3/uL 4.1 8.9 0.5(LL)  Hemoglobin 11.6 - 15.9 g/dL 10.0(L) 9.2(L) 8.7(L)  Hematocrit 34.8 - 46.6 % 30.8(L) 28.4(L) 26.5(L)  Platelets 145 - 400 10e3/uL 59(L)  22(L) 8(LL)   CMP Latest Ref Rng 01/19/2016 01/12/2016 01/05/2016  Glucose 70 - 140 mg/dl 128 126 114  BUN 7.0 - 26.0 mg/dL 7.0 7.7 14.0  Creatinine 0.6 - 1.1 mg/dL 0.7 0.7 0.7  Sodium 136 - 145 mEq/L 140 140 140  Potassium 3.5 - 5.1 mEq/L 3.9 3.7 3.8  Chloride 101 - 111 mmol/L - - -  CO2 22 - 29 mEq/L 30(H) 29 30(H)  Calcium 8.4 - 10.4 mg/dL 8.2(L) 8.4 8.8  Total Protein 6.4 - 8.3 g/dL 4.8(L) 4.8(L) 5.1(L)  Total Bilirubin 0.20 - 1.20 mg/dL 1.14 0.93 1.63(H)  Alkaline Phos 40 - 150 U/L 126 152(H) 116  AST 5 - 34 U/L 32 26 26  ALT 0 - 55 U/L 18 18 16     STUDIES: No results found.    ASSESSMENT: 50 y.o. [place] woman  (1)  status post left lumpectomy under the care of Dr. 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 2 of 23 lymph nodes were involved.  Margins were clear.  (2) status post adjuvant chemotherapy consisting of 6 q. three-week doses of docetaxel/ doxorubicin/ cyclophosphamide given between January 2007 and may of 2007, with last dose on 04/20/2006.  (3) status post radiation therapy to the left breast, completed 07/11/2006  (4) began on tamoxifen in early August 2007 and continued until October 2009. She status post hysterectomy with bilateral salpingo-oophorectomy on 07/15/2008, and was started on letrozole in October of 2009.  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 PET and CT of the chest showed no evidence of metastasis in January 2011.  (6)  status post bilateral mastectomies 01/25/2010, with the right breast showing no evidence of malignancy; in the left breast showing a 1.0 cm grade 2 invasive ductal carcinoma,  ER +22%, PR negative, HER-2/neu negative, with proliferation marker of 13%. Margins were clear. Patient underwent concurrent left latissimus flap reconstruction and right implant reconstruction.  (7)  status post additional chemotherapy with one cycle of carboplatin/gemcitabine given on 03/11/2010. She then received 5 q. three-week cycles of IV CMF between 03/25/2010 and 06/17/2010.  (8) started on exemestane in January 2012 and continued until late November 2013 when she was hospitalized for apparent TIA at which time her exemestane was stopped and not resumed  (9) METASTATIC DISEASE: evidence of disease recurence noted on chest CT, liver MRI and PET scan in December 2014, with suspicious lung nodules, lymphadenopathy, and 3 lesions in the right lobe of the liver, but no evidence of bony disease and no evidence of brain involvement by brain MRI September 2014. Biopsy of a left supraclavicular lymph node on 12/11/2013 confirmed metastatic carcinoma, estrogen receptor 45% positive, progesterone receptor 17% positive, with no HER-2 amplification.  (10) fulvestrant started 12/03/2013, discontinued 03/25/2014 with progression  (11) exemestane/ everolimus started 04/04/2014; everolimus held 04/14/2014, with skin rash and mouth soreness; resumed at 5 mg a day as of 04/29/2014, discontinued 05/09/2014 with similar symptoms  (12) continuing exemestane, adding palbociclib 05/26/2014;   (a) palbociclib dose dropped to 75 mg every other day for 21 days beginning with cycle 4  (b) exemestane and Palbociclib discontinued December 2015 with evidence of progression  (13) [facility] referral placed 04/28/2049 -- re-sent 05/23/2014-- shows Pi3KCA and TP53 mutations  (a) [facility] one test requested 12/10/2014-- confirms [employer] results  (b) did  not  qualify for capecitabine/ BYL 719 trial because of elevated lipase  (14) started eribulin 12/26/2014, 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   (a) dpse dropped from 2g BID to 1.5g BID starting on 04/26/15  (b) stopped 05/17/2015 (after 4 cycles) with progression  (16) started carboplatin/ gemcitabine 05/26/2015, given day one and day 8 of each 21 day cycle for a total of 8 cycles, last dose 11/10/2015, stopped because of persistent cytopenias  (a) given her history of severe neutropenia with this chemotherapy we'll do Neupogen days 2, 3 and 4 of each cycle and onpro day 9  (b) carbo dose dropped by 25% w cycle 2 due to very low platelet nadir  (c) CT scans after 3 cycles (07/27/2015) show measurable response  (d) CT scans after 6 cycles (09/28/2015)show continuing response  (e) CT scans post-treatment (11/23/2015) shows continuing response  (17) Left pleural effusion\   (a) s/p L thoracentesis 05/22/2015, 05/29/2015 and 06/05/2015  (b) attempt at pleurx 06/12/2015 cancelled as effusion loculated and scant  (18) symptomatic anemia  (a) Aranesp started 07/28/2015, retic <1% on 10/06/2015  (b) Feraheme given 08/04/2015; ferritin >2K on 10/06/2015, last dose on 11/1  (19) liposomal doxorubicin started 11/24/2015, repeated every 21 days w onpro support  (a) echo 03/23/2015 shows an EF of 55%  PLAN:  Yena is ready to proceed to cycle 3 of liposomal doxorubicin. She is generally tolerating these well and she is going to be restaged 02/01/2016, with a visit the next day.   she is terrified that this is not going to be working.  People do associate side effects with effectiveness and she is tolerating this well, which I think is the main reason she is discouraged. In reality she can have all the benefits and none of  the side effects that we just have to wait and get those scan results. If this is not working we will consider CMF. We can also go back to Carbo Gemzar, which clearly wants working previously.  10 mg of methadone 3 times a day was too much for her. I wrote her for 5 mg today and I suggested she try first of all just twice a day, one at bedtime and one midmorning. If she tolerates that well we can consider going to 3 times a day.  If she tries to 5 mg and it proves to be too much again, she can break those and half to 2-1/2 mg and try that twice a day and then 3 times a day as tolerated. If that does not work we will have to go to [place].  She was doing all right with the Advil, but with her platelet count dropping so low I don't think it is safe for her to be a nonsteroidals. We could consider Celebrex.    she is taking appropriate measures to have  Regular soft bowel movements.   We will need to check her counts next week to make sure she does not need a platelet or red cell transfusion. That was operationalized.  Otherwise she will see me again in 2 weeks from now. She knows to call for any problems that may develop before that    Chauncey Cruel, MD   01/19/2016 1:15 PM

## 2016-01-19 NOTE — Patient Instructions (Signed)

## 2016-01-19 NOTE — Patient Instructions (Signed)
[facility] Discharge Instructions for Patients Receiving Chemotherapy  Today you received the following chemotherapy agents :  Doxil.  To help prevent nausea and vomiting after your treatment, we encourage you to take your nausea medication as prescribed.   If you develop nausea and vomiting that is not controlled by your nausea medication, call the clinic.   BELOW ARE SYMPTOMS THAT SHOULD BE REPORTED IMMEDIATELY:  *FEVER GREATER THAN 100.5 F  *CHILLS WITH OR WITHOUT FEVER  NAUSEA AND VOMITING THAT IS NOT CONTROLLED WITH YOUR NAUSEA MEDICATION  *UNUSUAL SHORTNESS OF BREATH  *UNUSUAL BRUISING OR BLEEDING  TENDERNESS IN MOUTH AND THROAT WITH OR WITHOUT PRESENCE OF ULCERS  *URINARY PROBLEMS  *BOWEL PROBLEMS  UNUSUAL RASH Items with * indicate a potential emergency and should be followed up as soon as possible.  Feel free to call the clinic you have any questions or concerns. The clinic phone number is [phone].  Please show the CHEMO ALERT CARD at check-in to the Emergency Department and triage nurse.   

## 2016-01-19 NOTE — Telephone Encounter (Signed)
Added lab for 2/14 per 2/7 pof. Gave patient avs report and appointments for February.

## 2016-01-21 ENCOUNTER — Telehealth: Payer: Self-pay | Admitting: *Deleted

## 2016-01-21 NOTE — Telephone Encounter (Signed)
This RN spoke with pt per faxed report from [facility] 2/8.  Per report Pang called to state son has flu and can she take tamiflu-  This RN informed pt MD made aware of the above. She should take tamiflu as prescribed and monitor for symptoms.  Kristin Hoffman is avoiding exposure to her 49 year old son as best as possible.  Understands to call if concerns occur.

## 2016-01-22 ENCOUNTER — Ambulatory Visit: Payer: 59 | Admitting: Oncology

## 2016-01-22 ENCOUNTER — Other Ambulatory Visit: Payer: 59

## 2016-01-26 ENCOUNTER — Other Ambulatory Visit: Payer: Self-pay | Admitting: Nurse Practitioner

## 2016-01-26 ENCOUNTER — Other Ambulatory Visit (HOSPITAL_BASED_OUTPATIENT_CLINIC_OR_DEPARTMENT_OTHER): Payer: 59

## 2016-01-26 ENCOUNTER — Ambulatory Visit (HOSPITAL_BASED_OUTPATIENT_CLINIC_OR_DEPARTMENT_OTHER): Payer: 59

## 2016-01-26 VITALS — BP 113/64 | HR 93 | Temp 98.8°F | Resp 17

## 2016-01-26 DIAGNOSIS — D649 Anemia, unspecified: Secondary | ICD-10-CM | POA: Diagnosis not present

## 2016-01-26 DIAGNOSIS — D696 Thrombocytopenia, unspecified: Secondary | ICD-10-CM

## 2016-01-26 DIAGNOSIS — D473 Essential (hemorrhagic) thrombocythemia: Secondary | ICD-10-CM

## 2016-01-26 DIAGNOSIS — C50919 Malignant neoplasm of unspecified site of unspecified female breast: Secondary | ICD-10-CM

## 2016-01-26 LAB — CBC WITH DIFFERENTIAL/PLATELET
BASO%: 0.5 % (ref 0.0–2.0)
BASOS ABS: 0 10*3/uL (ref 0.0–0.1)
EOS ABS: 0 10*3/uL (ref 0.0–0.5)
EOS%: 0.2 % (ref 0.0–7.0)
HCT: 31.9 % — ABNORMAL LOW (ref 34.8–46.6)
HEMOGLOBIN: 10.2 g/dL — AB (ref 11.6–15.9)
LYMPH#: 0.5 10*3/uL — AB (ref 0.9–3.3)
LYMPH%: 12.4 % — ABNORMAL LOW (ref 14.0–49.7)
MCH: 36.4 pg — ABNORMAL HIGH (ref 25.1–34.0)
MCHC: 32 g/dL (ref 31.5–36.0)
MCV: 113.9 fL — AB (ref 79.5–101.0)
MONO#: 0.1 10*3/uL (ref 0.1–0.9)
MONO%: 1.5 % (ref 0.0–14.0)
NEUT%: 85.4 % — ABNORMAL HIGH (ref 38.4–76.8)
NEUTROS ABS: 3.4 10*3/uL (ref 1.5–6.5)
NRBC: 0 % (ref 0–0)
PLATELETS: 12 10*3/uL — AB (ref 145–400)
RBC: 2.8 10*6/uL — ABNORMAL LOW (ref 3.70–5.45)
RDW: 26.1 % — AB (ref 11.2–14.5)
WBC: 4 10*3/uL (ref 3.9–10.3)

## 2016-01-26 LAB — COMPREHENSIVE METABOLIC PANEL
ALBUMIN: 2.7 g/dL — AB (ref 3.5–5.0)
ALK PHOS: 153 U/L — AB (ref 40–150)
ALT: 16 U/L (ref 0–55)
AST: 27 U/L (ref 5–34)
Anion Gap: 7 mEq/L (ref 3–11)
BILIRUBIN TOTAL: 2.1 mg/dL — AB (ref 0.20–1.20)
BUN: 11.3 mg/dL (ref 7.0–26.0)
CO2: 31 mEq/L — ABNORMAL HIGH (ref 22–29)
Calcium: 8.5 mg/dL (ref 8.4–10.4)
Chloride: 102 mEq/L (ref 98–109)
Creatinine: 0.6 mg/dL (ref 0.6–1.1)
Glucose: 106 mg/dl (ref 70–140)
Potassium: 3.3 mEq/L — ABNORMAL LOW (ref 3.5–5.1)
SODIUM: 140 meq/L (ref 136–145)
TOTAL PROTEIN: 4.9 g/dL — AB (ref 6.4–8.3)

## 2016-01-26 MED ORDER — ACETAMINOPHEN 325 MG PO TABS
650.0000 mg | ORAL_TABLET | Freq: Once | ORAL | Status: AC
Start: 2016-01-26 — End: 2016-01-26
  Administered 2016-01-26: 650 mg via ORAL

## 2016-01-26 MED ORDER — DIPHENHYDRAMINE HCL 25 MG PO CAPS
25.0000 mg | ORAL_CAPSULE | Freq: Once | ORAL | Status: AC
  Administered 2016-01-26: 25 mg via ORAL

## 2016-01-26 MED ORDER — HEPARIN SOD (PORK) LOCK FLUSH 100 UNIT/ML IV SOLN
250.0000 [IU] | INTRAVENOUS | Status: DC | PRN
  Filled 2016-01-26: qty 5

## 2016-01-26 MED ORDER — DIPHENHYDRAMINE HCL 25 MG PO CAPS
ORAL_CAPSULE | ORAL | Status: AC
  Filled 2016-01-26: qty 1

## 2016-01-26 MED ORDER — SODIUM CHLORIDE 0.9% FLUSH
10.0000 mL | INTRAVENOUS | Status: DC | PRN
  Administered 2016-01-26: 10 mL via INTRAVENOUS
  Filled 2016-01-26: qty 10

## 2016-01-26 MED ORDER — SODIUM CHLORIDE 0.9% FLUSH
3.0000 mL | INTRAVENOUS | Status: DC | PRN
  Filled 2016-01-26: qty 10

## 2016-01-26 MED ORDER — HEPARIN SOD (PORK) LOCK FLUSH 100 UNIT/ML IV SOLN
500.0000 [IU] | Freq: Once | INTRAVENOUS | Status: DC
  Filled 2016-01-26: qty 5

## 2016-01-26 MED ORDER — ACETAMINOPHEN 325 MG PO TABS
ORAL_TABLET | ORAL | Status: AC
  Filled 2016-01-26: qty 2

## 2016-01-26 NOTE — Patient Instructions (Signed)
Platelet Transfusion  A platelet transfusion is a procedure in which you receive donated platelets through an IV tube. Platelets are tiny pieces of blood cells. When a blood vessel is damaged, platelets collect in the damaged area to help form a blood clot. This begins the healing process. If your platelet count gets too low, your blood may have trouble clotting.  You may need a platelet transfusion if you have a condition that causes a low number of platelets (thrombocytopenia). A platelet transfusion may be used to stop or prevent bleeding.  LET YOUR HEALTH CARE PROVIDER KNOW ABOUT:   Any allergies you have.   All medicines you are taking, including vitamins, herbs, eye drops, creams, and over-the-counter medicines.   Previous problems you or members of your family have had with the use of anesthetics.   Any blood disorders you have.   Previous surgeries you have had.   Any medical conditions you may have.   Any reactions you have had during a previous transfusion. RISKS AND COMPLICATIONS Generally, this is a safe procedure. However, problems may occur, including:   Fever with or without chills. The fever usually occurs within the first 4 hours of the transfusion and returns to normal within 48 hours.  Allergic reaction. The reaction is most commonly caused by antibodies your body creates against substances in the transfusion. Signs of an allergic reaction may include itching, hives, difficulty breathing, shock, or low blood pressure.  Sudden (acute) or delayed hemolytic reaction. This rare reaction can occur during the transfusion and up to 28 days after the transfusion. The reaction usually occurs when your body's defense system (immune system) attacks the new platelets. Signs of a hemolytic reaction may include fever, headache, difficulty breathing, low blood pressure, a rapid heartbeat, or pain in your back, abdomen, chest, or IV site.  Transfusion-related acute lung injury  (TRALI). TRALI can occur within hours of a transfusion, or several days later. This is a rare reaction that causes lung damage. The cause is not known.  Infection. Signs of this rare complication may include fever, chills, vomiting, a rapid heartbeat, or low blood pressure. BEFORE THE PROCEDURE   You may have a blood test to determine your blood type. This is necessary to find out what kind ofplatelets best matches your platelets.  If you have had an allergic reaction to a transfusion in the past, you may be given medicine to help prevent a reaction. Take this medicine only as directed by your health care provider.  Your temperature, blood pressure, and pulse will be monitored before the transfusion. PROCEDURE  An IV will be started in your hand or arm.  The transfusion will be attached to your IV tubing. The bag of donated platelets will be attached to your IV tube andgiven into your vein.  Your temperature, blood pressure, and pulse will be monitored regularly during the transfusion. This monitoring is done to help detect early signs of a transfusion reaction.  If you have any signs or symptoms of a reaction, your transfusion will be stopped and you may be given medicine.  When your transfusion is complete, your IV will be removed.  Pressure may be applied to the IV site for a few minutes.  A bandage (dressing) will be applied. The procedure may vary among health care providers and hospitals. AFTER THE PROCEDURE  Your blood pressure, temperature, and pulse will be monitored regularly.   This information is not intended to replace advice given to you by your health   care provider. Make sure you discuss any questions you have with your health care provider.   Document Released: 09/25/2007 Document Revised: 12/19/2014 Document Reviewed: 10/08/2014 Elsevier Interactive Patient Education 2016 [employer]  

## 2016-01-27 LAB — PREPARE PLATELET PHERESIS
UNIT DIVISION: 0
Unit division: 0

## 2016-02-01 ENCOUNTER — Ambulatory Visit (HOSPITAL_COMMUNITY)
Admission: RE | Admit: 2016-02-01 | Discharge: 2016-02-01 | Disposition: A | Discharge status: Home / Self Care | Payer: 59 | Source: Ambulatory Visit | Attending: Nurse Practitioner | Admitting: Nurse Practitioner

## 2016-02-01 DIAGNOSIS — C50312 Malignant neoplasm of lower-inner quadrant of left female breast: Secondary | ICD-10-CM | POA: Insufficient documentation

## 2016-02-01 DIAGNOSIS — J90 Pleural effusion, not elsewhere classified: Secondary | ICD-10-CM | POA: Diagnosis not present

## 2016-02-01 DIAGNOSIS — R188 Other ascites: Secondary | ICD-10-CM | POA: Diagnosis not present

## 2016-02-01 MED ORDER — IOHEXOL 300 MG/ML  SOLN
50.0000 mL | Freq: Once | INTRAMUSCULAR | Status: AC | PRN
  Administered 2016-02-01: 50 mL via ORAL

## 2016-02-01 MED ORDER — IOHEXOL 300 MG/ML  SOLN
100.0000 mL | Freq: Once | INTRAMUSCULAR | Status: AC | PRN
  Administered 2016-02-01: 100 mL via INTRAVENOUS

## 2016-02-02 ENCOUNTER — Telehealth: Payer: Self-pay | Admitting: Oncology

## 2016-02-02 ENCOUNTER — Ambulatory Visit (HOSPITAL_BASED_OUTPATIENT_CLINIC_OR_DEPARTMENT_OTHER): Payer: 59

## 2016-02-02 ENCOUNTER — Other Ambulatory Visit: Payer: Self-pay | Admitting: *Deleted

## 2016-02-02 ENCOUNTER — Other Ambulatory Visit (HOSPITAL_BASED_OUTPATIENT_CLINIC_OR_DEPARTMENT_OTHER): Payer: 59

## 2016-02-02 ENCOUNTER — Ambulatory Visit: Payer: 59

## 2016-02-02 ENCOUNTER — Ambulatory Visit (HOSPITAL_BASED_OUTPATIENT_CLINIC_OR_DEPARTMENT_OTHER): Payer: 59 | Admitting: Oncology

## 2016-02-02 VITALS — BP 109/63 | HR 110 | Temp 98.3°F | Resp 18 | Ht 63.0 in | Wt 171.4 lb

## 2016-02-02 VITALS — BP 118/70 | HR 90 | Temp 98.4°F | Resp 18

## 2016-02-02 DIAGNOSIS — D649 Anemia, unspecified: Secondary | ICD-10-CM | POA: Diagnosis not present

## 2016-02-02 DIAGNOSIS — D696 Thrombocytopenia, unspecified: Secondary | ICD-10-CM

## 2016-02-02 DIAGNOSIS — C50912 Malignant neoplasm of unspecified site of left female breast: Secondary | ICD-10-CM

## 2016-02-02 DIAGNOSIS — C50312 Malignant neoplasm of lower-inner quadrant of left female breast: Secondary | ICD-10-CM | POA: Diagnosis not present

## 2016-02-02 DIAGNOSIS — Z95828 Presence of other vascular implants and grafts: Secondary | ICD-10-CM

## 2016-02-02 DIAGNOSIS — D702 Other drug-induced agranulocytosis: Secondary | ICD-10-CM

## 2016-02-02 DIAGNOSIS — C770 Secondary and unspecified malignant neoplasm of lymph nodes of head, face and neck: Secondary | ICD-10-CM | POA: Diagnosis not present

## 2016-02-02 DIAGNOSIS — C50919 Malignant neoplasm of unspecified site of unspecified female breast: Secondary | ICD-10-CM

## 2016-02-02 LAB — COMPREHENSIVE METABOLIC PANEL
ALBUMIN: 2.6 g/dL — AB (ref 3.5–5.0)
ALK PHOS: 103 U/L (ref 40–150)
ALT: 14 U/L (ref 0–55)
ANION GAP: 6 meq/L (ref 3–11)
AST: 25 U/L (ref 5–34)
BUN: 11 mg/dL (ref 7.0–26.0)
CALCIUM: 8.8 mg/dL (ref 8.4–10.4)
CO2: 30 mEq/L — ABNORMAL HIGH (ref 22–29)
Chloride: 103 mEq/L (ref 98–109)
Creatinine: 0.7 mg/dL (ref 0.6–1.1)
EGFR: >90 mL/min/{1.73_m2} (ref >90)
Glucose: 119 mg/dl (ref 70–140)
POTASSIUM: 4.2 meq/L (ref 3.5–5.1)
Sodium: 139 mEq/L (ref 136–145)
Total Bilirubin: 1.05 mg/dL (ref 0.20–1.20)
Total Protein: 4.8 g/dL — ABNORMAL LOW (ref 6.4–8.3)

## 2016-02-02 LAB — CBC WITH DIFFERENTIAL/PLATELET
BASO%: 1.2 % (ref 0.0–2.0)
Basophils Absolute: 0 10*3/uL (ref 0.0–0.1)
EOS%: 0.6 % (ref 0.0–7.0)
Eosinophils Absolute: 0 10*3/uL (ref 0.0–0.5)
HEMATOCRIT: 21 % — AB (ref 34.8–46.6)
HEMOGLOBIN: 6.8 g/dL — AB (ref 11.6–15.9)
LYMPH#: 0.3 10*3/uL — AB (ref 0.9–3.3)
LYMPH%: 37.1 % (ref 14.0–49.7)
MCH: 36.1 pg — AB (ref 25.1–34.0)
MCHC: 32.2 g/dL (ref 31.5–36.0)
MCV: 112 fL — AB (ref 79.5–101.0)
MONO#: 0.1 10*3/uL (ref 0.1–0.9)
MONO%: 11.9 % (ref 0.0–14.0)
NEUT#: 0.4 10*3/uL — CL (ref 1.5–6.5)
NEUT%: 49.2 % (ref 38.4–76.8)
PLATELETS: 13 10*3/uL — AB (ref 145–400)
RBC: 1.88 10*6/uL — ABNORMAL LOW (ref 3.70–5.45)
RDW: 25.5 % — AB (ref 11.2–14.5)
WBC: 0.8 10*3/uL — AB (ref 3.9–10.3)

## 2016-02-02 LAB — PREPARE RBC (CROSSMATCH)

## 2016-02-02 MED ORDER — SODIUM CHLORIDE 0.9% FLUSH
10.0000 mL | INTRAVENOUS | Status: DC | PRN
  Administered 2016-02-02: 10 mL via INTRAVENOUS
  Filled 2016-02-02: qty 10

## 2016-02-02 MED ORDER — ACETAMINOPHEN 325 MG PO TABS
ORAL_TABLET | ORAL | Status: AC
  Filled 2016-02-02: qty 2

## 2016-02-02 MED ORDER — HEPARIN SOD (PORK) LOCK FLUSH 100 UNIT/ML IV SOLN
250.0000 [IU] | INTRAVENOUS | Status: AC | PRN
  Administered 2016-02-02: 500 [IU]
  Filled 2016-02-02: qty 5

## 2016-02-02 MED ORDER — TRAMADOL HCL 50 MG PO TABS: 50.0000 mg | ORAL_TABLET | Freq: Four times a day (QID) | ORAL | Status: DC | PRN

## 2016-02-02 MED ORDER — METHYLPHENIDATE HCL 5 MG PO TABS: ORAL_TABLET | ORAL | Status: DC

## 2016-02-02 MED ORDER — DIPHENHYDRAMINE HCL 25 MG PO CAPS
25.0000 mg | ORAL_CAPSULE | Freq: Once | ORAL | Status: AC
  Administered 2016-02-02: 25 mg via ORAL

## 2016-02-02 MED ORDER — SODIUM CHLORIDE 0.9% FLUSH
3.0000 mL | INTRAVENOUS | Status: AC | PRN
  Administered 2016-02-02: 10 mL
  Filled 2016-02-02: qty 10

## 2016-02-02 MED ORDER — SODIUM CHLORIDE 0.9 % IV SOLN
250.0000 mL | Freq: Once | INTRAVENOUS | Status: AC
  Administered 2016-02-02: 250 mL via INTRAVENOUS

## 2016-02-02 MED ORDER — DIPHENHYDRAMINE HCL 25 MG PO CAPS
ORAL_CAPSULE | ORAL | Status: AC
  Filled 2016-02-02: qty 1

## 2016-02-02 MED ORDER — ACETAMINOPHEN 325 MG PO TABS
650.0000 mg | ORAL_TABLET | Freq: Once | ORAL | Status: AC
  Administered 2016-02-02: 650 mg via ORAL

## 2016-02-02 NOTE — Patient Instructions (Signed)

## 2016-02-02 NOTE — Progress Notes (Signed)
ID: Kristin Hoffman OB: February 01, 1966  MR#: [id number]  BHA#:[id number]  PCP: Drake Leach, MD GYN:  Kristin Hoffman SU: Kristin Hoffman OTHER MD: Kristin Hoffman, Kristin Hoffman  CHIEF COMPLAINT:  Stage IV breast cancer  CURRENT TREATMENT: Liposomal doxorubicin  BREAST CANCER HISTORY: This patient was previously followed by Kristin Hoffman, and was transferred to Kristin Hoffman service as of 08/20/2013.  At the age of 50, the patient had a screening mammogram as a baseline. She had recently given birth and was on birth control pills at that time. The mammogram in November 2006 showed an area of architectural distortion in the left lower inner quadrant. Subsequently an ultrasound was obtained of the left breast showing a 2.0 x 1.5 x 1.4 cm mass, at the 8:00 position, 4 cm from the nipple. A core biopsy performed on 10/21/2005 showed an invasive mammary carcinoma, ER +70%, PR +41%, HER-2/neu negative, with proliferation marker of 38%. [phone])  Breast MRI on 11/08/2005 confirmed a 2.5 cm spiculated mass in the left lower inner quadrant with 3 small satellite nodules adjacent to the primary mass: 3.5 cm posterior medial to the primary mass, measuring 9 mm; 1.5 cm anterior medial to the primary mass measuring 5 mm; and 2 cm medial to the primary mass measuring 5 mm. No axillary adenopathy was noted. No suspicious masses or enhancement are noted in the right breast.  Kristin Hoffman underwentt left lumpectomy under the care of Kristin Hoffman on 11/18/2005 for a 2.5 cm grade 3 invasive ductal carcinoma, ER +70%, PR +41%, HER-2/neu negative, with proliferation marker of 38%. 2 of 23 lymph nodes were involved.  Margins were clear.  She received adjuvant chemotherapy consisting of 6 q. three-week doses of docetaxel/doxorubicin/cyclophosphamide given between January 2007 and may of 2007, with last dose on 04/20/2006. She underwent radiation therapy completed 07/11/2006, after which she began on tamoxifen in early August  2007.  She underwent hysterectomy with bilateral salpingo-oophorectomy on 07/15/2008, and was started on letrozole in October of 2009.  A mammogram 11/10/2009 showed microcalcifications in the left breast, and a subsequent biopsy on 11/18/2009 confirmed invasive mammary carcinoma in the left breast. PET and CT of the chest showed no evidence of metastasis in January 2011.  She underwent bilateral mastectomies 01/25/2010, with the right breast showing no evidence of malignancy; in the left breast showing a 1.0 cm grade 2 invasive ductal carcinoma with high-grade DCIS. Tumor was ER +22%, PR negative, HER-2/neu negative, with proliferation marker of 13%. Margins were clear. Patient underwent concurrent left latissimus flap reconstruction and right implant reconstruction.  She received additional chemotherapy with one cycle of carboplatin/gemcitabine given on 03/11/2010. She then received 5 q. three-week cycles of IV CMF between 03/25/2010 and 06/17/2010.  She was started on exemestane in January 2012 and continued until late November 2013 when she was hospitalized for apparent TIA.   Her subsequent history is as detailed below  INTERVAL HISTORY: Kristin Hoffman returns today for follow up of her metastatic breast cancer accompanied by her husband Kristin Hoffman. Today is day 15, cycle 3 of doxil, repeated every 28 days. We are just restage her with CT scans of the chest abdomen and pelvis. I am reading this is essentially stable   REVIEW OF SYSTEMS: Kristin Hoffman has not had any more mouth sores since the first cycle, after which we started Valtrex. She continues to have a poor quality of life although her general outlook is positive. She has pain below the breast on the left going all the way around  the chest wall to the back. She describes this as crampy and stabbing. It is constant. She also has back pain, some difficulty walking, and headaches at times. All of this is very much stable but poorly controlled on her current  medicines. When she took methadone 10 mg 3 times a day she had no pain but she was also very confused and loopy. When she takes methadone 5 mg 3 times a day she is not loopy but her pain is not well controlled. Today she decide to take no pain medicine except a single tramadol. She wanted to be alert for this visit. The pain medicines of course do cause constipation. She is reluctant to take stool softeners because "I'm on so many medicines", but she does take prune juice at times. She has some sinus issues including a runny nose, and some shortness of breath particularly when walking up stairs. Her appetite is poor. She has occasional loose bowel movements. She has abdominal cramps at times. She has some stress urinary incontinence. She bruises easily. She feels forgetful and anxious. She is occasionally depressed. She had hiccups for a couple of days last week. She felt shaky and weak. She has gone to the eye doctor and been told her eyes are dry. She is now using some eye drops which are helping. Her right ear is stopped. A detailed review of systems today was otherwise stable   PAST MEDICaL HISTORY: Past Medical History  Diagnosis Date  . Depression   . Reflux   . Hx-TIA (transient ischemic attack) 11/22/2013  . Chronic headaches 11/22/2013  . Anxiety 11/22/2013  . Breast cancer ([place])   . Cancer ([place])   . Breast cancer metastasized to multiple sites [facility]) 12/24/2013    PAST SURGICAL HISTORY: Past Surgical History  Procedure Laterality Date  . Mastectomy w/ nodes partial    . Mastectomy    . Breast reconstruction    . Portacath placement      x 2  . Portacath removal      x 2  . Abdominal hysterectomy    . Tee without cardioversion  11/12/2012    Procedure: TRANSESOPHAGEAL ECHOCARDIOGRAM (TEE);  Surgeon: Kristin Furbish, MD;  Location: [facility]COPY;  Service: Cardiovascular;  Laterality: N/A;  This TEE may be Dr. Marlou Hoffman or a [facility] Dr    FAMILY HISTORY Both parents are alive and well.  Patient has one sister who is 58 years younger and is in good health. No other history of breast or ovarian cancer in the family. Family History  Problem Relation Age of Onset  . Diabetes Mellitus II Neg Hx   . Hypertension Neg Hx     GYNECOLOGIC HISTORY:   (Updated January 2015) G2P2, menarche at age 40 with irregular menses. On birth control pills in the past. On Clomid to induce ovulation with first pregnancy. Also had preeclampsia with first pregnancy. Status post hysterectomy and bilateral salpingo-oophorectomy in August 2009.  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 She is originally from [place]. She's been married to [place], for 13 years. He works as an Pharmacist, hospital at [employer].   ADVANCED DIRECTIVES:  Not in place. At  A prior clinic visit the patient was given the appropriate forms to complete and notarize at her discretion.    HEALTH MAINTENANCE:  (Updated 11/22/2013) Social History  Substance Use Topics  . Smoking status: Never Smoker   .  Smokeless tobacco: Never Used  . Alcohol Use: Yes     Comment: rarely     Colonoscopy:  Never  PAP: S/P LAVH/BSO in August 2009  Bone density: Never  Lipid panel: Not on file   Allergies  Allergen Reactions  . Afinitor [Everolimus] Hives and Itching    Current Outpatient Prescriptions  Medication Sig Dispense Refill  . acidophilus (RISAQUAD) CAPS capsule Take 1 capsule by mouth every morning.     . Biotin 5000 MCG CAPS Take 5,000 mcg by mouth every morning.  30 capsule   . cetirizine (ZYRTEC) 10 MG tablet Take 10 mg by mouth at bedtime.     . Cholecalciferol 2000 UNITS CHEW Chew 1 tablet by mouth daily with breakfast.     . diphenoxylate-atropine (LOMOTIL) 2.5-0.025 MG tablet TAKE 2 TABLETS BY MOUTH 4 TIMES A DAY AS NEEDED (Patient not taking: Reported on 12/22/2015) 30 tablet 0  . escitalopram (LEXAPRO) 20 MG tablet Take 1  tablet (20 mg total) by mouth daily. 30 tablet 12  . fluconazole (DIFLUCAN) 100 MG tablet Take 1 tablet (100 mg total) by mouth daily. 30 tablet 1  . Ibuprofen (ADVIL) 200 MG CAPS Take 2 capsules (400 mg total) by mouth 3 (three) times daily with meals as needed. (Patient not taking: Reported on 01/12/2016) 120 each 0  . LORazepam (ATIVAN) 0.5 MG tablet Take one tablet at bedtime as needed for sleep 120 tablet 1  . LOTEMAX 0.5 % ophthalmic suspension Place 1 drop into both eyes 4 (four) times daily. Week 1& 2 - Instill 4 drops per day both eyes Week 3& 4-  Instill 2 drops per day both eyes  plus Soothe XP 2 drops per day both eyes    . Melatonin 10 MG TABS Take 1 tablet by mouth at bedtime.    . methadone (DOLOPHINE) 5 MG tablet Take 1 tablet (5 mg total) by mouth every 8 (eight) hours. 30 tablet 0  . montelukast (SINGULAIR) 10 MG tablet Take 10 mg by mouth daily with breakfast.     . Multiple Vitamin (MULTIVITAMIN) tablet Take 1 tablet by mouth every morning.     . nystatin (MYCOSTATIN) 100000 UNIT/ML suspension Take 5 mLs (500,000 Units total) by mouth 4 (four) times daily. (Patient not taking: Reported on 01/12/2016) 240 mL 1  . omeprazole (PRILOSEC) 40 MG capsule Take 40 mg by mouth every morning.     . ondansetron (ZOFRAN) 8 MG tablet TAKE 1 TABLET BY MOUTH 2 TIMES DAILY. START DAY AFTER CHEMO FOR 2 DAYS. THEN TAKE AS NEEDED NAUSEA 30 tablet 1  . oxyCODONE-acetaminophen (PERCOCET/ROXICET) 5-325 MG tablet Take 1 tablet by mouth every 4 (four) hours as needed for severe pain. 60 tablet 0  . prochlorperazine (COMPAZINE) 10 MG tablet Take 10 mg by mouth every 6 (six) hours as needed for nausea or vomiting. Reported on 01/12/2016    . traMADol (ULTRAM) 50 MG tablet Take 1-2 tablets (50-100 mg total) by mouth every 6 (six) hours as needed. 120 tablet 1  . valACYclovir (VALTREX) 1000 MG tablet Take 1 tablet (1,000 mg total) by mouth daily. 60 tablet 4   No current facility-administered medications for  this visit.    OBJECTIVE: Young white woman  who appears stated age  67 Vitals:   02/02/16 1218  BP: 109/63  Pulse: 110  Temp: 98.3 F (36.8 C)  Resp: 18  Body mass index is 30.37 kg/(m^2).  ECOG: 2 Filed Weights   02/02/16 1218  Weight:  171 lb 6.4 oz (77.747 kg)   Sclerae unicteric, pupils round and equal Oropharynx clear and moist-- no thrush or other lesions No cervical or supraclavicular adenopathy Lungs no rales or rhonchi, no wheezes appreciated Heart regular rate and rhythm Abd soft, nontender, positive bowel sounds, no masses palpated MSK no focal spinal tenderness, no upper extremity lymphedema Neuro: nonfocal, well oriented but somewhat scattered, anxious affect Breasts: Deferred    LAB RESULTS:  CBC Latest Ref Rng 02/02/2016 01/26/2016 01/19/2016  WBC 3.9 - 10.3 10e3/uL 0.8(LL) 4.0 4.1  Hemoglobin 11.6 - 15.9 g/dL 6.8(LL) 10.2(L) 10.0(L)  Hematocrit 34.8 - 46.6 % 21.0(L) 31.9(L) 30.8(L)  Platelets 145 - 400 10e3/uL 13(L) 12(L) 59(L)   CMP Latest Ref Rng 01/26/2016 01/19/2016 01/12/2016  Glucose 70 - 140 mg/dl 106 128 126  BUN 7.0 - 26.0 mg/dL 11.3 7.0 7.7  Creatinine 0.6 - 1.1 mg/dL 0.6 0.7 0.7  Sodium 136 - 145 mEq/L 140 140 140  Potassium 3.5 - 5.1 mEq/L 3.3(L) 3.9 3.7  CO2 22 - 29 mEq/L 31(H) 30(H) 29  Calcium 8.4 - 10.4 mg/dL 8.5 8.2(L) 8.4  Total Protein 6.4 - 8.3 g/dL 4.9(L) 4.8(L) 4.8(L)  Total Bilirubin 0.20 - 1.20 mg/dL 2.10(H) 1.14 0.93  Alkaline Phos 40 - 150 U/L 153(H) 126 152(H)  AST 5 - 34 U/L 27 32 26  ALT 0 - 55 U/L 16 18 18     STUDIES: Ct Chest W Contrast  02/01/2016  CLINICAL DATA:  Breast cancer with hepatic metastatic disease. Recent new round of chemotherapy, restaging. EXAM: CT CHEST, ABDOMEN, AND PELVIS WITH CONTRAST TECHNIQUE: Multidetector CT imaging of the chest, abdomen and pelvis was performed following the standard protocol during bolus administration of intravenous contrast. CONTRAST:  19m OMNIPAQUE IOHEXOL 300 MG/ML SOLN,  1023mOMNIPAQUE IOHEXOL 300 MG/ML SOLN COMPARISON:  11/23/2015 FINDINGS: CT CHEST FINDINGS Mediastinum/Nodes: Left supraclavicular node, 1.3 cm in short axis on image 6 series 2, previously measured at 1.2 cm in short axis. Aberrant right subclavian artery passes behind the esophagus. Bilateral breast implants interposed between the pectoralis muscles. Left axillary clips. Thin calcified subcarinal node, stable. Lungs/Pleura: Small left pleural effusion. Pleural thickening anteriorly on the left side along the axilla, but less thickening than previous, currently 1.7 cm in thickness, previously 2.2 cm. There is some mild nodularity along the anteromedial pleural margin on the left with faint calcification. This is similar to prior. Trace right pleural fluid observed with a somewhat pedunculated pleural-based lesion extending down towards the lung measuring 2.2 by 1.7 cm on image 27 series 4, previously 2.1 by 1.6 cm. As before, there is peripheral reticulonodular opacities in the lungs with secondary interstitial lobular thickening. Lobularity of the pleural adipose tissue along the right hemidiaphragm appears increased, with slight nodularity. A sub solid subpleural left apical nodule measures 1.1 by 0.8 cm on image 9 series 4, previously 0.8 by 0.5 cm. Musculoskeletal: Unremarkable CT ABDOMEN PELVIS FINDINGS Hepatobiliary: Perihepatic ascites with hypodensity anteriorly in the right hepatic lobe previously 2.0 by 2.0 cm by my measurements on parasagittal images, and currently 1.5 by 1.8 cm on image 31 series 303. Diffuse hepatic steatosis. Pancreas: Primarily atrophic pancreas but with indistinct low-density lymph nodes difficult separate from the upper margin of the pancreatic head, hypodensity in this vicinity measuring 1.9 by 1.6 cm, previously 1.7 by 2.3 cm by my measurements. Spleen: Unremarkable Adrenals/Urinary Tract: Unremarkable Stomach/Bowel: Unremarkable Vascular/Lymphatic: Indistinct speckled  hypodensity in calcification in the porta hepatis and upper peripancreatic region above the pancreatic head,  not appreciably changed from prior. This likely represents partially calcified adenopathy. Reproductive: Uterus absent.  Ovaries not well seen. Other: Pelvic ascites noted. Perihepatic and perisplenic ascites along with a small amount of ascites in the right paracolic gutter. Musculoskeletal: Unremarkable IMPRESSION: 1. Although greatly improved from June 2016, compared to 11/23/15 there is a generally mixed appearance, with improvement in size of an anterior hepatic hypodense lesion, but with slight enlargement of a left upper lobe lesion and of a pleural-based right upper lobe lesion. Essentially stable left supraclavicular lymph node remains enlarged. 2. Small left and trace right pleural effusions. Mild to moderate abdominal ascites. 3. Essentially stable appearance of hypodense conglomerate lymph nodes with speckled calcification along the porta hepatis and upper margin of the pancreatic head. A cystic lesion of the pancreatic head appears to correspond to a prior metastatic lesion. Electronically Signed   By: Van Clines M.D.   On: 02/01/2016 15:20   Ct Abdomen Pelvis W Contrast  02/01/2016  CLINICAL DATA:  Breast cancer with hepatic metastatic disease. Recent new round of chemotherapy, restaging. EXAM: CT CHEST, ABDOMEN, AND PELVIS WITH CONTRAST TECHNIQUE: Multidetector CT imaging of the chest, abdomen and pelvis was performed following the standard protocol during bolus administration of intravenous contrast. CONTRAST:  47m OMNIPAQUE IOHEXOL 300 MG/ML SOLN, 1040mOMNIPAQUE IOHEXOL 300 MG/ML SOLN COMPARISON:  11/23/2015 FINDINGS: CT CHEST FINDINGS Mediastinum/Nodes: Left supraclavicular node, 1.3 cm in short axis on image 6 series 2, previously measured at 1.2 cm in short axis. Aberrant right subclavian artery passes behind the esophagus. Bilateral breast implants interposed between the  pectoralis muscles. Left axillary clips. Thin calcified subcarinal node, stable. Lungs/Pleura: Small left pleural effusion. Pleural thickening anteriorly on the left side along the axilla, but less thickening than previous, currently 1.7 cm in thickness, previously 2.2 cm. There is some mild nodularity along the anteromedial pleural margin on the left with faint calcification. This is similar to prior. Trace right pleural fluid observed with a somewhat pedunculated pleural-based lesion extending down towards the lung measuring 2.2 by 1.7 cm on image 27 series 4, previously 2.1 by 1.6 cm. As before, there is peripheral reticulonodular opacities in the lungs with secondary interstitial lobular thickening. Lobularity of the pleural adipose tissue along the right hemidiaphragm appears increased, with slight nodularity. A sub solid subpleural left apical nodule measures 1.1 by 0.8 cm on image 9 series 4, previously 0.8 by 0.5 cm. Musculoskeletal: Unremarkable CT ABDOMEN PELVIS FINDINGS Hepatobiliary: Perihepatic ascites with hypodensity anteriorly in the right hepatic lobe previously 2.0 by 2.0 cm by my measurements on parasagittal images, and currently 1.5 by 1.8 cm on image 31 series 303. Diffuse hepatic steatosis. Pancreas: Primarily atrophic pancreas but with indistinct low-density lymph nodes difficult separate from the upper margin of the pancreatic head, hypodensity in this vicinity measuring 1.9 by 1.6 cm, previously 1.7 by 2.3 cm by my measurements. Spleen: Unremarkable Adrenals/Urinary Tract: Unremarkable Stomach/Bowel: Unremarkable Vascular/Lymphatic: Indistinct speckled hypodensity in calcification in the porta hepatis and upper peripancreatic region above the pancreatic head, not appreciably changed from prior. This likely represents partially calcified adenopathy. Reproductive: Uterus absent.  Ovaries not well seen. Other: Pelvic ascites noted. Perihepatic and perisplenic ascites along with a small amount  of ascites in the right paracolic gutter. Musculoskeletal: Unremarkable IMPRESSION: 1. Although greatly improved from June 2016, compared to 11/23/15 there is a generally mixed appearance, with improvement in size of an anterior hepatic hypodense lesion, but with slight enlargement of a left upper lobe lesion and of a  pleural-based right upper lobe lesion. Essentially stable left supraclavicular lymph node remains enlarged. 2. Small left and trace right pleural effusions. Mild to moderate abdominal ascites. 3. Essentially stable appearance of hypodense conglomerate lymph nodes with speckled calcification along the porta hepatis and upper margin of the pancreatic head. A cystic lesion of the pancreatic head appears to correspond to a prior metastatic lesion. Electronically Signed   By: Van Clines M.D.   On: 02/01/2016 15:20      ASSESSMENT: 50 y.o. 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 2 of 23 lymph nodes were involved.  Margins were clear.  (2) status post adjuvant chemotherapy consisting of 6 q. three-week doses of docetaxel/ doxorubicin/ cyclophosphamide given between January 2007 and may of 2007, with last dose on 04/20/2006.  (3) status post radiation therapy to the left breast, completed 07/11/2006  (4) began on tamoxifen in early August 2007 and continued until October 2009. She status post hysterectomy with bilateral salpingo-oophorectomy on 07/15/2008, and was started on letrozole in October of 2009.  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 PET and CT of the chest showed no evidence of metastasis in January 2011.  (6) status post bilateral mastectomies 01/25/2010, with the right breast  showing no evidence of malignancy; in the left breast showing a 1.0 cm grade 2 invasive ductal carcinoma,  ER +22%, PR negative, HER-2/neu negative, with proliferation marker of 13%. Margins were clear. Patient underwent concurrent left latissimus flap reconstruction and right implant reconstruction.  (7)  status post additional chemotherapy with one cycle of carboplatin/gemcitabine given on 03/11/2010. She then received 5 q. three-week cycles of IV CMF between 03/25/2010 and 06/17/2010.  (8) started on exemestane in January 2012 and continued until late November 2013 when she was hospitalized for apparent TIA at which time her exemestane was stopped and not resumed  (9) METASTATIC DISEASE: evidence of disease recurence noted on chest CT, liver MRI and PET scan in December 2014, with suspicious lung nodules, lymphadenopathy, and 3 lesions in the right lobe of the liver, but no evidence of bony disease and no evidence of brain involvement by brain MRI September 2014. Biopsy of a left supraclavicular lymph node on 12/11/2013 confirmed metastatic carcinoma, estrogen receptor 45% positive, progesterone receptor 17% positive, with no HER-2 amplification.  (10) fulvestrant started 12/03/2013, discontinued 03/25/2014 with progression  (11) exemestane/ everolimus started 04/04/2014; everolimus held 04/14/2014, with skin rash and mouth soreness; resumed at 5 mg a day as of 04/29/2014, discontinued 05/09/2014 with similar symptoms  (12) continuing exemestane, adding palbociclib 05/26/2014;   (a) palbociclib dose dropped to 75 mg every other day for 21 days beginning with cycle 4  (b) exemestane and Palbociclib discontinued December 2015 with evidence of progression  (13) [facility] referral placed 04/28/2049 -- re-sent 05/23/2014-- shows Pi3KCA and TP53 mutations  (a) [facility] one test requested 12/10/2014-- confirms [employer] results  (b) did not qualify for capecitabine/ BYL 719 trial because of elevated  lipase  (14) started eribulin 12/26/2014, 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   (a) dpse dropped from 2g BID to  1.5g BID starting on 04/26/15  (b) stopped 05/17/2015 (after 4 cycles) with progression  (16) started carboplatin/ gemcitabine 05/26/2015, given day one and day 8 of each 21 day cycle for a total of 8 cycles, last dose 11/10/2015, stopped because of persistent cytopenias  (a) given her history of severe neutropenia with this chemotherapy we'll do Neupogen days 2, 3 and 4 of each cycle and onpro day 9  (b) carbo dose dropped by 25% w cycle 2 due to very low platelet nadir  (c) CT scans after 3 cycles (07/27/2015) show measurable response  (d) CT scans after 6 cycles (09/28/2015)show continuing response  (e) CT scans post-treatment (11/23/2015) shows continuing response  (17) Left pleural effusion\   (a) s/p L thoracentesis 05/22/2015, 05/29/2015 and 06/05/2015  (b) attempt at pleurx 06/12/2015 cancelled as effusion loculated and scant  (18) symptomatic anemia  (a) Aranesp started 07/28/2015, retic <1% on 10/06/2015  (b) Feraheme given 08/04/2015; ferritin >2K on 10/06/2015, last dose on 11/1  (19) liposomal doxorubicin started 11/24/2015, repeated every 21 days w onpro support  (a) echo 03/23/2015 shows an EF of 55%  PLAN:  I spent approximately one hour with Kristin Hoffman and her husband Kristin Hoffman today going over her complex situation. We discussed her new scans in detail. There has been minimal increase in a couple of lung lesions and mild decrease in some peri-pancreatic and hepatic lesions --overall this is within the limits of registration error in my opinion so what were really looking at his stable disease.  She did have significant shrinkage of disease between June and December 2016 with the carbo/Gemzar  treatments but these were considerably harsher and affected her quality of life negatively. We certainly can go back to those. My recommendation is that we continue the Doxil and another 3 cycles. This medication can work better the longer it is received and she is toleratingmoderatelywell, with no further problems with mouth sores in particular since we added the Valtrex and no palmar plantar erythrodysesthesia or neuropathy.  She will need an echocardiogram and that is being ordered.  The second issue we discussed at length today was her pain medications. Most of the pain medications cause her to be "loopy" when they are given at sufficient doses to actually control the pain. oxycodone in addition causes her stomach pain.  Note that we have gotten away from nonsteroidals because of her very low repeated platelet counts.   Today so far she has only taken one tramadol. Observing her during the meeting I think her current pain is somewhere between a 4 and a 6 because she is able to ignore it and not feel that she needs to take something immediately which is the way I would interpret as 7, 8 or 9 which is what she calls it right now.  At any rate the plan is going to be to go back to methadone 5 mg 3 times a day and that will be her base. She  will take tramadol 1 or 2 tablets  up to 4 times a day as needed. This is certainly going to make her feel a little loopy as she puts it, but we are adding methylphenidate 5 mg with breakfast and lunch and I'm hopeful this combination will allow her to increase her narcotics enough to get her pain under good control and yet let her function at home where she is alone with the kids all day.  She clearly needs blood and platelet transfusion at present and she probably  will continue to need this on an every 2-3 week basis so I am setting her up for repeat labs in 2 weeks and possible transfusion. She will return in 1 week for her fourth cycle of Doxil. She knows to call  for any other problems that may develop before her next visit here.   Chauncey Cruel, MD   02/02/2016 12:23 PM

## 2016-02-02 NOTE — Telephone Encounter (Signed)
Left message to inform patient of echo appt set for 2/27 @ [facility]

## 2016-02-02 NOTE — Progress Notes (Signed)
Verbal order obtained from [name] to run blood at rate of 271mL/hr with second unit in hopes for pt to receive platelet infusion today and not have to return tomorrow for additional appointment.  Pt notified of plan of care and in agreement.  This information shared with Amy E, RN who will take over patient's care for this RN.

## 2016-02-02 NOTE — Progress Notes (Signed)
Unable to give the unit of platelets today due to them not being ready at this time. Pt aware and copy of appointments given to patient. Pt and VS stable at time of discharge.

## 2016-02-02 NOTE — Telephone Encounter (Signed)
appts made and avs printed. Messaged Linda for [employer]. Will contact pt with d/t

## 2016-02-03 ENCOUNTER — Other Ambulatory Visit: Payer: Self-pay

## 2016-02-03 ENCOUNTER — Ambulatory Visit (HOSPITAL_BASED_OUTPATIENT_CLINIC_OR_DEPARTMENT_OTHER): Payer: 59

## 2016-02-03 VITALS — BP 131/76 | HR 87 | Temp 99.1°F | Resp 18

## 2016-02-03 DIAGNOSIS — D649 Anemia, unspecified: Secondary | ICD-10-CM

## 2016-02-03 DIAGNOSIS — D696 Thrombocytopenia, unspecified: Secondary | ICD-10-CM

## 2016-02-03 DIAGNOSIS — C50912 Malignant neoplasm of unspecified site of left female breast: Secondary | ICD-10-CM

## 2016-02-03 DIAGNOSIS — C50312 Malignant neoplasm of lower-inner quadrant of left female breast: Secondary | ICD-10-CM

## 2016-02-03 LAB — TYPE AND SCREEN
ABO/RH(D): A POS
ANTIBODY SCREEN: NEGATIVE
UNIT DIVISION: 0
Unit division: 0

## 2016-02-03 MED ORDER — ACETAMINOPHEN 325 MG PO TABS
ORAL_TABLET | ORAL | Status: AC
  Filled 2016-02-03: qty 2

## 2016-02-03 MED ORDER — FAMOTIDINE 20 MG PO TABS
20.0000 mg | ORAL_TABLET | Freq: Once | ORAL | Status: AC
  Administered 2016-02-03: 20 mg via ORAL

## 2016-02-03 MED ORDER — SODIUM CHLORIDE 0.9 % IV SOLN
250.0000 mL | Freq: Once | INTRAVENOUS | Status: AC
  Administered 2016-02-03: 250 mL via INTRAVENOUS

## 2016-02-03 MED ORDER — FAMOTIDINE 20 MG PO TABS
ORAL_TABLET | ORAL | Status: AC
  Filled 2016-02-03: qty 1

## 2016-02-03 MED ORDER — OMEPRAZOLE 40 MG PO CPDR: 40.0000 mg | DELAYED_RELEASE_CAPSULE | Freq: Every morning | ORAL | Status: DC

## 2016-02-03 MED ORDER — HEPARIN SOD (PORK) LOCK FLUSH 100 UNIT/ML IV SOLN
500.0000 [IU] | Freq: Every day | INTRAVENOUS | Status: AC | PRN
  Administered 2016-02-03: 500 [IU]
  Filled 2016-02-03: qty 5

## 2016-02-03 MED ORDER — METHADONE HCL 5 MG PO TABS: 5.0000 mg | ORAL_TABLET | Freq: Three times a day (TID) | ORAL | Status: AC

## 2016-02-03 MED ORDER — DIPHENHYDRAMINE HCL 25 MG PO CAPS
25.0000 mg | ORAL_CAPSULE | Freq: Once | ORAL | Status: AC
  Administered 2016-02-03: 25 mg via ORAL

## 2016-02-03 MED ORDER — ACETAMINOPHEN 325 MG PO TABS
650.0000 mg | ORAL_TABLET | Freq: Once | ORAL | Status: AC
  Administered 2016-02-03: 650 mg via ORAL

## 2016-02-03 MED ORDER — SODIUM CHLORIDE 0.9% FLUSH
10.0000 mL | INTRAVENOUS | Status: AC | PRN
  Administered 2016-02-03: 10 mL
  Filled 2016-02-03: qty 10

## 2016-02-03 NOTE — Patient Instructions (Signed)
Platelet Transfusion  A platelet transfusion is a procedure in which you receive donated platelets through an IV tube. Platelets are tiny pieces of blood cells. When a blood vessel is damaged, platelets collect in the damaged area to help form a blood clot. This begins the healing process. If your platelet count gets too low, your blood may have trouble clotting.  You may need a platelet transfusion if you have a condition that causes a low number of platelets (thrombocytopenia). A platelet transfusion may be used to stop or prevent bleeding.  LET YOUR HEALTH CARE PROVIDER KNOW ABOUT:   Any allergies you have.   All medicines you are taking, including vitamins, herbs, eye drops, creams, and over-the-counter medicines.   Previous problems you or members of your family have had with the use of anesthetics.   Any blood disorders you have.   Previous surgeries you have had.   Any medical conditions you may have.   Any reactions you have had during a previous transfusion. RISKS AND COMPLICATIONS Generally, this is a safe procedure. However, problems may occur, including:   Fever with or without chills. The fever usually occurs within the first 4 hours of the transfusion and returns to normal within 48 hours.  Allergic reaction. The reaction is most commonly caused by antibodies your body creates against substances in the transfusion. Signs of an allergic reaction may include itching, hives, difficulty breathing, shock, or low blood pressure.  Sudden (acute) or delayed hemolytic reaction. This rare reaction can occur during the transfusion and up to 28 days after the transfusion. The reaction usually occurs when your body's defense system (immune system) attacks the new platelets. Signs of a hemolytic reaction may include fever, headache, difficulty breathing, low blood pressure, a rapid heartbeat, or pain in your back, abdomen, chest, or IV site.  Transfusion-related acute lung injury  (TRALI). TRALI can occur within hours of a transfusion, or several days later. This is a rare reaction that causes lung damage. The cause is not known.  Infection. Signs of this rare complication may include fever, chills, vomiting, a rapid heartbeat, or low blood pressure. BEFORE THE PROCEDURE   You may have a blood test to determine your blood type. This is necessary to find out what kind ofplatelets best matches your platelets.  If you have had an allergic reaction to a transfusion in the past, you may be given medicine to help prevent a reaction. Take this medicine only as directed by your health care provider.  Your temperature, blood pressure, and pulse will be monitored before the transfusion. PROCEDURE  An IV will be started in your hand or arm.  The transfusion will be attached to your IV tubing. The bag of donated platelets will be attached to your IV tube andgiven into your vein.  Your temperature, blood pressure, and pulse will be monitored regularly during the transfusion. This monitoring is done to help detect early signs of a transfusion reaction.  If you have any signs or symptoms of a reaction, your transfusion will be stopped and you may be given medicine.  When your transfusion is complete, your IV will be removed.  Pressure may be applied to the IV site for a few minutes.  A bandage (dressing) will be applied. The procedure may vary among health care providers and hospitals. AFTER THE PROCEDURE  Your blood pressure, temperature, and pulse will be monitored regularly.   This information is not intended to replace advice given to you by your health   care provider. Make sure you discuss any questions you have with your health care provider.   Document Released: 09/25/2007 Document Revised: 12/19/2014 Document Reviewed: 10/08/2014 Elsevier Interactive Patient Education 2016 [employer]  

## 2016-02-03 NOTE — Progress Notes (Signed)
Patient complaining of acid reflux. Dr. Lewayne Bunting. Prilosec 20 mg administered per order.

## 2016-02-03 NOTE — Telephone Encounter (Signed)
Patient called requesting a refill on the methadone 5 mg which she states that she is taking half a tablet and is still feeling as if she is not making sense when she is speaking.  With the methadone she is also taking the tramadol 50 mg.  Patient states that her pain with these two medication is still an issue however she has not tried the ritalin because she is waiting for her husband to be present when she tries this because she is unsure how she will react to it. Patient's methadone was refilled and prescription taken to the infusion area.  The requested prilosec was eprescribed to her pharmacy.

## 2016-02-04 LAB — PREPARE PLATELET PHERESIS: Unit division: 0

## 2016-02-08 ENCOUNTER — Other Ambulatory Visit: Payer: Self-pay | Admitting: Oncology

## 2016-02-08 ENCOUNTER — Ambulatory Visit (HOSPITAL_COMMUNITY)
Admission: RE | Admit: 2016-02-08 | Discharge: 2016-02-08 | Disposition: A | Discharge status: Home / Self Care | Payer: 59 | Source: Ambulatory Visit | Attending: Oncology | Admitting: Oncology

## 2016-02-08 ENCOUNTER — Other Ambulatory Visit: Payer: Self-pay | Admitting: *Deleted

## 2016-02-08 DIAGNOSIS — C50312 Malignant neoplasm of lower-inner quadrant of left female breast: Secondary | ICD-10-CM | POA: Insufficient documentation

## 2016-02-08 DIAGNOSIS — C50912 Malignant neoplasm of unspecified site of left female breast: Secondary | ICD-10-CM

## 2016-02-08 DIAGNOSIS — Z09 Encounter for follow-up examination after completed treatment for conditions other than malignant neoplasm: Secondary | ICD-10-CM | POA: Diagnosis not present

## 2016-02-08 NOTE — Progress Notes (Unsigned)
Kristin Hoffman had an echo today which showed a slightly lower ejection fraction in the 45% range. 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 If this is a clot it is at significant risk of embolization which could cause a significant stroke. On the other hand we really cannot anticoagulate her with her platelet count in the 13,000 range as it has been.  This could be tumor. In a way that would be safer for her.  General the sort this out we are going to obtain a cardiac MRI. If that does not give [place] the answer we will do a PET scan.  I discussed all this with her today. She has a good understanding of the issues. She will return tomorrow to continue her chemotherapy.

## 2016-02-09 ENCOUNTER — Ambulatory Visit: Payer: 59

## 2016-02-09 ENCOUNTER — Encounter: Payer: Self-pay | Admitting: *Deleted

## 2016-02-09 ENCOUNTER — Telehealth: Payer: Self-pay | Admitting: *Deleted

## 2016-02-09 ENCOUNTER — Encounter: Payer: Self-pay | Admitting: Internal Medicine

## 2016-02-09 ENCOUNTER — Other Ambulatory Visit: Payer: Self-pay | Admitting: *Deleted

## 2016-02-09 ENCOUNTER — Other Ambulatory Visit (HOSPITAL_BASED_OUTPATIENT_CLINIC_OR_DEPARTMENT_OTHER): Payer: 59

## 2016-02-09 ENCOUNTER — Telehealth: Payer: Self-pay | Admitting: Oncology

## 2016-02-09 ENCOUNTER — Telehealth: Payer: Self-pay | Admitting: Internal Medicine

## 2016-02-09 ENCOUNTER — Ambulatory Visit (HOSPITAL_BASED_OUTPATIENT_CLINIC_OR_DEPARTMENT_OTHER): Payer: 59

## 2016-02-09 ENCOUNTER — Ambulatory Visit (HOSPITAL_BASED_OUTPATIENT_CLINIC_OR_DEPARTMENT_OTHER): Payer: 59 | Admitting: Oncology

## 2016-02-09 VITALS — BP 115/75 | HR 105 | Temp 98.9°F | Resp 18 | Ht 63.0 in | Wt 169.2 lb

## 2016-02-09 DIAGNOSIS — C50912 Malignant neoplasm of unspecified site of left female breast: Secondary | ICD-10-CM | POA: Diagnosis not present

## 2016-02-09 DIAGNOSIS — Z95828 Presence of other vascular implants and grafts: Secondary | ICD-10-CM

## 2016-02-09 DIAGNOSIS — D696 Thrombocytopenia, unspecified: Secondary | ICD-10-CM

## 2016-02-09 DIAGNOSIS — D702 Other drug-induced agranulocytosis: Secondary | ICD-10-CM

## 2016-02-09 DIAGNOSIS — I213 ST elevation (STEMI) myocardial infarction of unspecified site: Secondary | ICD-10-CM

## 2016-02-09 DIAGNOSIS — C770 Secondary and unspecified malignant neoplasm of lymph nodes of head, face and neck: Secondary | ICD-10-CM | POA: Diagnosis not present

## 2016-02-09 DIAGNOSIS — C50312 Malignant neoplasm of lower-inner quadrant of left female breast: Secondary | ICD-10-CM

## 2016-02-09 DIAGNOSIS — C773 Secondary and unspecified malignant neoplasm of axilla and upper limb lymph nodes: Secondary | ICD-10-CM

## 2016-02-09 DIAGNOSIS — C50919 Malignant neoplasm of unspecified site of unspecified female breast: Secondary | ICD-10-CM

## 2016-02-09 DIAGNOSIS — I513 Intracardiac thrombosis, not elsewhere classified: Secondary | ICD-10-CM | POA: Insufficient documentation

## 2016-02-09 DIAGNOSIS — C787 Secondary malignant neoplasm of liver and intrahepatic bile duct: Secondary | ICD-10-CM

## 2016-02-09 LAB — COMPREHENSIVE METABOLIC PANEL
ALBUMIN: 2.7 g/dL — AB (ref 3.5–5.0)
ALK PHOS: 98 U/L (ref 40–150)
ALT: 13 U/L (ref 0–55)
AST: 24 U/L (ref 5–34)
Anion Gap: 6 mEq/L (ref 3–11)
BUN: 4.5 mg/dL — AB (ref 7.0–26.0)
CHLORIDE: 106 meq/L (ref 98–109)
CO2: 28 mEq/L (ref 22–29)
Calcium: 8.2 mg/dL — ABNORMAL LOW (ref 8.4–10.4)
Creatinine: 0.7 mg/dL (ref 0.6–1.1)
EGFR: >90 mL/min/{1.73_m2} (ref >90)
GLUCOSE: 106 mg/dL (ref 70–140)
POTASSIUM: 3.8 meq/L (ref 3.5–5.1)
SODIUM: 140 meq/L (ref 136–145)
Total Bilirubin: 0.94 mg/dL (ref 0.20–1.20)
Total Protein: 4.8 g/dL — ABNORMAL LOW (ref 6.4–8.3)

## 2016-02-09 LAB — CBC WITH DIFFERENTIAL/PLATELET
BASO%: 0.6 % (ref 0.0–2.0)
BASOS ABS: 0 10*3/uL (ref 0.0–0.1)
EOS%: 0.2 % (ref 0.0–7.0)
Eosinophils Absolute: 0 10*3/uL (ref 0.0–0.5)
HCT: 28.5 % — ABNORMAL LOW (ref 34.8–46.6)
HEMOGLOBIN: 9.3 g/dL — AB (ref 11.6–15.9)
LYMPH#: 0.4 10*3/uL — AB (ref 0.9–3.3)
LYMPH%: 23.7 % (ref 14.0–49.7)
MCH: 35.4 pg — AB (ref 25.1–34.0)
MCHC: 32.7 g/dL (ref 31.5–36.0)
MCV: 108.2 fL — ABNORMAL HIGH (ref 79.5–101.0)
MONO#: 0.5 10*3/uL (ref 0.1–0.9)
MONO%: 30.8 % — AB (ref 0.0–14.0)
NEUT#: 0.7 10*3/uL — ABNORMAL LOW (ref 1.5–6.5)
NEUT%: 44.7 % (ref 38.4–76.8)
Platelets: 47 10*3/uL — ABNORMAL LOW (ref 145–400)
RBC: 2.63 10*6/uL — ABNORMAL LOW (ref 3.70–5.45)
RDW: 25.9 % — AB (ref 11.2–14.5)
WBC: 1.6 10*3/uL — ABNORMAL LOW (ref 3.9–10.3)

## 2016-02-09 MED ORDER — HEPARIN SOD (PORK) LOCK FLUSH 100 UNIT/ML IV SOLN
500.0000 [IU] | Freq: Once | INTRAVENOUS | Status: AC
  Administered 2016-02-09: 500 [IU] via INTRAVENOUS
  Filled 2016-02-09: qty 5

## 2016-02-09 MED ORDER — TBO-FILGRASTIM 480 MCG/0.8ML ~~LOC~~ SOSY
480.0000 ug | PREFILLED_SYRINGE | Freq: Once | SUBCUTANEOUS | Status: AC
  Administered 2016-02-09: 480 ug via SUBCUTANEOUS
  Filled 2016-02-09: qty 0.8

## 2016-02-09 MED ORDER — SODIUM CHLORIDE 0.9% FLUSH
10.0000 mL | INTRAVENOUS | Status: DC | PRN
  Administered 2016-02-09: 10 mL via INTRAVENOUS
  Filled 2016-02-09: qty 10

## 2016-02-09 MED ORDER — ENOXAPARIN SODIUM 100 MG/ML ~~LOC~~ SOLN
100.0000 mg | Freq: Once | SUBCUTANEOUS | Status: AC
  Administered 2016-02-09: 100 mg via SUBCUTANEOUS
  Filled 2016-02-09: qty 1

## 2016-02-09 MED ORDER — ENOXAPARIN SODIUM 100 MG/ML ~~LOC~~ SOLN: 100.0000 mg | SUBCUTANEOUS | Status: DC

## 2016-02-09 MED ORDER — FILGRASTIM 480 MCG/0.8ML IJ SOSY
480.0000 ug | PREFILLED_SYRINGE | Freq: Once | INTRAMUSCULAR | Status: DC
  Filled 2016-02-09: qty 0.8

## 2016-02-09 NOTE — Patient Instructions (Signed)

## 2016-02-09 NOTE — Progress Notes (Signed)
Pt presented to infusion room today after seeing [name].  Plan for patient to hold chemo treatment but to receive Lovenox and Neupogen.  Both injections given without complications.  Pt instructed on how to properly give Lovenox injections in the event she does not qualify for home health.  Pt verbalized understanding and has no further questions at time of discharge.  Pt PAC saline/heparin locked and deaccessed.  Pt discharged ambulatory with no concerns at this time.

## 2016-02-09 NOTE — Patient Instructions (Signed)
Filgrastim, G-CSF injection What is this medicine? FILGRASTIM, G-CSF (fil GRA stim) is a granulocyte colony-stimulating factor that stimulates the growth of neutrophils, a type of white blood cell (WBC) important in the body's fight against infection. It is used to reduce the incidence of fever and infection in patients with certain types of cancer who are receiving chemotherapy that affects the bone marrow, to stimulate blood cell production for removal of WBCs from the body prior to a bone marrow transplantation, to reduce the incidence of fever and infection in patients who have severe chronic neutropenia, and to improve survival outcomes following high-dose radiation exposure that is toxic to the bone marrow. This medicine may be used for other purposes; ask your health care provider or pharmacist if you have questions. What should I tell my health care provider before I take this medicine? They need to know if you have any of these conditions: -kidney disease -latex allergy -ongoing radiation therapy -sickle cell disease -an unusual or allergic reaction to filgrastim, pegfilgrastim, other medicines, foods, dyes, or preservatives -pregnant or trying to get pregnant -breast-feeding How should I use this medicine? This medicine is for injection under the skin or infusion into a vein. As an infusion into a vein, it is usually given by a health care professional in a hospital or clinic setting. If you get this medicine at home, you will be taught how to prepare and give this medicine. Refer to the Instructions for Use that come with your medication packaging. Use exactly as directed. Take your medicine at regular intervals. Do not take your medicine more often than directed. It is important that you put your used needles and syringes in a special sharps container. Do not put them in a trash can. If you do not have a sharps container, call your pharmacist or healthcare provider to get one. Talk to  your pediatrician regarding the use of this medicine in children. While this drug may be prescribed for children as young as 7 months for selected conditions, precautions do apply. Overdosage: If you think you have taken too much of this medicine contact a poison control center or emergency room at once. NOTE: This medicine is only for you. Do not share this medicine with others. What if I miss a dose? It is important not to miss your dose. Call your doctor or health care professional if you miss a dose. What may interact with this medicine? This medicine may interact with the following medications: -medicines that may cause a release of neutrophils, such as lithium This list may not describe all possible interactions. Give your health care provider a list of all the medicines, herbs, non-prescription drugs, or dietary supplements you use. Also tell them if you smoke, drink alcohol, or use illegal drugs. Some items may interact with your medicine. What should I watch for while using this medicine? You may need blood work done while you are taking this medicine. What side effects may I notice from receiving this medicine? Side effects that you should report to your doctor or health care professional as soon as possible: -allergic reactions like skin rash, itching or hives, swelling of the face, lips, or tongue -dizziness or feeling faint -fever -pain, redness, or irritation at site where injected -pinpoint red spots on the skin -shortness of breath or breathing problems -signs and symptoms of kidney injury like trouble passing urine, change in the amount of urine, or red or dark-brown urine -stomach or side pain, or pain at the shoulder -  swelling -tiredness - unusual bleeding or bruising Side effects that usually do not require medical attention (report to your doctor or health care professional if they continue or are bothersome): -bone pain -headache -muscle pain This list may not  describe all possible side effects. Call your doctor for medical advice about side effects. You may report side effects to FDA at 1-800-FDA-1088. Where should I keep my medicine? Keep out of the reach of children. Store in a refrigerator between 2 and 8 degrees C (36 and 46 degrees F). Do not freeze. Keep in carton to protect from light. Throw away this medicine if vials or syringes are left out of the refrigerator for more than 24 hours. Throw away any unused medicine after the expiration date. NOTE: This sheet is a summary. It may not cover all possible information. If you have questions about this medicine, talk to your doctor, pharmacist, or health care provider.    2016, Elsevier/[employer] Standard. (2015-06-17 10:57:13)  Enoxaparin injection What is this medicine? ENOXAPARIN (ee nox a PA rin) is used after knee, hip, or abdominal surgeries to prevent blood clotting. It is also used to treat existing blood clots in the lungs or in the veins. This medicine may be used for other purposes; ask your health care provider or pharmacist if you have questions. What should I tell my health care provider before I take this medicine? They need to know if you have any of these conditions: -bleeding disorders, hemorrhage, or hemophilia -infection of the heart or heart valves -kidney or liver disease -previous stroke -prosthetic heart valve -recent surgery or delivery of a baby -ulcer in the stomach or intestine, diverticulitis, or other bowel disease -an unusual or allergic reaction to enoxaparin, heparin, pork or pork products, other medicines, foods, dyes, or preservatives -pregnant or trying to get pregnant -breast-feeding How should I use this medicine? This medicine is for injection under the skin. It is usually given by a health-care professional. You or a family member may be trained on how to give the injections. If you are to give yourself injections, make sure you understand how to use the  syringe, measure the dose if necessary, and give the injection. To avoid bruising, do not rub the site where this medicine has been injected. Do not take your medicine more often than directed. Do not stop taking except on the advice of your doctor or health care professional. Make sure you receive a puncture-resistant container to dispose of the needles and syringes once you have finished with them. Do not reuse these items. Return the container to your doctor or health care professional for proper disposal. Talk to your pediatrician regarding the use of this medicine in children. Special care may be needed. Overdosage: If you think you have taken too much of this medicine contact a poison control center or emergency room at once. NOTE: This medicine is only for you. Do not share this medicine with others. What if I miss a dose? If you miss a dose, take it as soon as you can. If it is almost time for your next dose, take only that dose. Do not take double or extra doses. What may interact with this medicine? -aspirin and aspirin-like medicines -certain medicines that treat or prevent blood clots -dipyridamole -NSAIDs, medicines for pain and inflammation, like ibuprofen or naproxen This list may not describe all possible interactions. Give your health care provider a list of all the medicines, herbs, non-prescription drugs, or dietary supplements you use. Also  tell them if you smoke, drink alcohol, or use illegal drugs. Some items may interact with your medicine. What should I watch for while using this medicine? Visit your doctor or health care professional for regular checks on your progress. Your condition will be monitored carefully while you are receiving this medicine. Notify your doctor or health care professional and seek emergency treatment if you develop breathing problems; changes in vision; chest pain; severe, sudden headache; pain, swelling, warmth in the leg; trouble speaking; sudden  numbness or weakness of the face, arm, or leg. These can be signs that your condition has gotten worse. If you are going to have surgery, tell your doctor or health care professional that you are taking this medicine. Do not stop taking this medicine without first talking to your doctor. Be sure to refill your prescription before you run out of medicine. Avoid sports and activities that might cause injury while you are using this medicine. Severe falls or injuries can cause unseen bleeding. Be careful when using sharp tools or knives. Consider using an Copy. Take special care brushing or flossing your teeth. Report any injuries, bruising, or red spots on the skin to your doctor or health care professional. What side effects may I notice from receiving this medicine? Side effects that you should report to your doctor or health care professional as soon as possible: -allergic reactions like skin rash, itching or hives, swelling of the face, lips, or tongue -feeling faint or lightheaded, falls -signs and symptoms of bleeding such as bloody or black, tarry stools; red or dark-brown urine; spitting up blood or brown material that looks like coffee grounds; red spots on the skin; unusual bruising or bleeding from the eye, gums, or nose Side effects that usually do not require medical attention (report to your doctor or health care professional if they continue or are bothersome): -pain, redness, or irritation at site where injected This list may not describe all possible side effects. Call your doctor for medical advice about side effects. You may report side effects to FDA at 1-800-FDA-1088. Where should I keep my medicine? Keep out of the reach of children. Store at room temperature between 15 and 30 degrees C (59 and 86 degrees F). Do not freeze. If your injections have been specially prepared, you may need to store them in the refrigerator. Ask your pharmacist. Throw away any unused medicine  after the expiration date. NOTE: This sheet is a summary. It may not cover all possible information. If you have questions about this medicine, talk to your doctor, pharmacist, or health care provider.    2016, [employer]vier/[employer] Standard. (2014-04-01 16:06:21)

## 2016-02-09 NOTE — Telephone Encounter (Signed)
This RN contacted [facility] to inform of new referral for home health for pt for new concerns related to need for lovenox injections and medication management.

## 2016-02-09 NOTE — Telephone Encounter (Signed)
Gave patient avs report and appointments for March and April. Per 2/28 pof added injection for tomorrow 3/1 - lab/[facility]/navelbine 3/7 and lab/[facility]/navelbine 3/14 ([facility] out - pt seeing [facility]). Pof came sent by [facility] - pt seen by [facility] (switch). Confirmed with [facility] patient will start navelbine 3/7 and all doxil should be cxd. Patient sent back to infusion area for injections today.

## 2016-02-09 NOTE — Telephone Encounter (Signed)
Called and left a voicemail regarding cardiac MRI for 02-22-16.  Letter mailed.

## 2016-02-09 NOTE — Progress Notes (Signed)
ID: [name] OB: 10-11-1966  MR#: [id number]  DJT#:[id number]  PCP: Drake Leach, MD GYN:  [name] SU: [name] OTHER MD: [name], [name]  CHIEF COMPLAINT:  Stage IV breast cancer  CURRENT TREATMENT: vinorelbine, lovenox  BREAST CANCER HISTORY: This patient was previously followed by [name], and was transferred to [name] service as of 08/20/2013.  At the age of 50, the patient had a screening mammogram as a baseline. She had recently given birth and was on birth control pills at that time. The mammogram in November 2006 showed an area of architectural distortion in the left lower inner quadrant. Subsequently an ultrasound was obtained of the left breast showing a 2.0 x 1.5 x 1.4 cm mass, at the 8:00 position, 4 cm from the nipple. A core biopsy performed on 10/21/2005 showed an invasive mammary carcinoma, ER +70%, PR +41%, HER-2/neu negative, with proliferation marker of 38%. [id number]  Breast MRI on 11/08/2005 confirmed a 2.5 cm spiculated mass in the left lower inner quadrant with 3 small satellite nodules adjacent to the primary mass: 3.5 cm posterior medial to the primary mass, measuring 9 mm; 1.5 cm anterior medial to the primary mass measuring 5 mm; and 2 cm medial to the primary mass measuring 5 mm. No axillary adenopathy was noted. No suspicious masses or enhancement are noted in the right breast.  [place] underwentt left lumpectomy under the care of Dr. 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 2 of 23 lymph nodes were involved.  Margins were clear.  She received adjuvant chemotherapy consisting of 6 q. three-week doses of docetaxel/doxorubicin/cyclophosphamide given between January 2007 and may of 2007, with last dose on 04/20/2006. She underwent radiation therapy completed 07/11/2006, after which she began on tamoxifen in early August  2007.  She underwent hysterectomy with bilateral salpingo-oophorectomy on 07/15/2008, and was started on letrozole in October of 2009.  A mammogram 11/10/2009 showed microcalcifications in the left breast, and a subsequent biopsy on 11/18/2009 confirmed invasive mammary carcinoma in the left breast. PET and CT of the chest showed no evidence of metastasis in January 2011.  She underwent bilateral mastectomies 01/25/2010, with the right breast showing no evidence of malignancy; in the left breast showing a 1.0 cm grade 2 invasive ductal carcinoma with high-grade DCIS. Tumor was ER +22%, PR negative, HER-2/neu negative, with proliferation marker of 13%. Margins were clear. Patient underwent concurrent left latissimus flap reconstruction and right implant reconstruction.  She received additional chemotherapy with one cycle of carboplatin/gemcitabine given on 03/11/2010. She then received 5 q. three-week cycles of IV CMF between 03/25/2010 and 06/17/2010.  She was started on exemestane in January 2012 and continued until late November 2013 when she was hospitalized for apparent TIA.   Her subsequent history is as detailed below  INTERVAL HISTORY: Onyx returns today for follow up of her stage IV breast cancer. We have been treating her with liposomal doxorubicin, but echocardiogram yesterday showed a drop in her ejection fraction to the 40% range. Accordingly we are stopping that medication and considering alternatives.  Of more concern, the echo also showed a 1.6 cm pedunculated mass in the cardiac apex. It is not clear whether this represents tumor or thrombus. To sort that out we need a cardiac echo and I have not been able to get that scheduled before 02/22/2016.   REVIEW OF SYSTEMS: Lyriq Is doing better  as far as pain is concerned, taking methadone 2.5 mg 3 times a day. She rarely needs to take tramadol for breakthrough pain. We are staying away from nonsteroidals given the low platelet count. The  pain medicine was making her sleepy, and we started her on methylphenidate, 5 mg with breakfast and lunch. This was a little bit too much and so we have cut that back to 5 mg at breakfast only. With that combination she is doing much better as far as her functional status and pain control is concerned. She has no symptoms but I can detect relating to her drop in ejection fraction. There have been no bleeding or clotting complications so far. A detailed review of systems today was otherwise stable  PAST MEDICaL HISTORY: Past Medical History  Diagnosis Date  . Depression   . Reflux   . Hx-TIA (transient ischemic attack) 11/22/2013  . Chronic headaches 11/22/2013  . Anxiety 11/22/2013  . Breast cancer ([place])   . Cancer ([place])   . Breast cancer metastasized to multiple sites [facility]) 12/24/2013    PAST SURGICAL HISTORY: Past Surgical History  Procedure Laterality Date  . Mastectomy w/ nodes partial    . Mastectomy    . Breast reconstruction    . Portacath placement      x 2  . Portacath removal      x 2  . Abdominal hysterectomy    . Tee without cardioversion  11/12/2012    Procedure: TRANSESOPHAGEAL ECHOCARDIOGRAM (TEE);  Surgeon: Candee Furbish, MD;  Location: [facility] ENDOSCOPY;  Service: Cardiovascular;  Laterality: N/A;  This TEE may be Dr. Marlou Porch or a LHC Dr    FAMILY HISTORY Both parents are alive and well. Patient has one sister who is 75 years younger and is in good health. No other history of breast or ovarian cancer in the family. Family History  Problem Relation Age of Onset  . Diabetes Mellitus II Neg Hx   . Hypertension Neg Hx     GYNECOLOGIC HISTORY:   (Updated January 2015) G2P2, menarche at age 22 with irregular menses. On birth control pills in the past. On Clomid to induce ovulation with first pregnancy. Also had preeclampsia with first pregnancy. Status post hysterectomy and bilateral salpingo-oophorectomy in August 2009.  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 She is originally from [place]. She's been married to Violet, for 13 years. He works as an Pharmacist, hospital at [employer].   ADVANCED DIRECTIVES:  Not in place. At  A prior clinic visit the patient was given the appropriate forms to complete and notarize at her discretion.    HEALTH MAINTENANCE:  (Updated 11/22/2013) Social History  Substance Use Topics  . Smoking status: Never Smoker   . Smokeless tobacco: Never Used  . Alcohol Use: Yes     Comment: rarely     Colonoscopy:  Never  PAP: S/P LAVH/BSO in August 2009  Bone density: Never  Lipid panel: Not on file   Allergies  Allergen Reactions  . Afinitor [Everolimus] Hives and Itching    Current Outpatient Prescriptions  Medication Sig Dispense Refill  . acidophilus (RISAQUAD) CAPS capsule Take 1 capsule by mouth every morning.     . Biotin 5000 MCG CAPS Take 5,000 mcg by mouth every morning.  30 capsule   . cetirizine (ZYRTEC) 10 MG tablet Take 10 mg by mouth at bedtime.     Marland Kitchen  Cholecalciferol 2000 UNITS CHEW Chew 1 tablet by mouth daily with breakfast.     . escitalopram (LEXAPRO) 20 MG tablet Take 1 tablet (20 mg total) by mouth daily. 30 tablet 12  . LORazepam (ATIVAN) 0.5 MG tablet Take one tablet at bedtime as needed for sleep 120 tablet 1  . LOTEMAX 0.5 % ophthalmic suspension Place 1 drop into both eyes 4 (four) times daily. Week 1& 2 - Instill 4 drops per day both eyes Week 3& 4-  Instill 2 drops per day both eyes  plus Soothe XP 2 drops per day both eyes    . Melatonin 10 MG TABS Take 1 tablet by mouth at bedtime.    . methadone (DOLOPHINE) 5 MG tablet Take 1 tablet (5 mg total) by mouth every 8 (eight) hours. 30 tablet 0  . methylphenidate (RITALIN) 5 MG tablet Take one tablet with breakfast and one tablet with lunch 60 tablet 0  . montelukast (SINGULAIR) 10 MG tablet Take 10 mg by mouth daily with breakfast.     . Multiple Vitamin  (MULTIVITAMIN) tablet Take 1 tablet by mouth every morning.     Marland Kitchen omeprazole (PRILOSEC) 40 MG capsule Take 1 capsule (40 mg total) by mouth every morning. 30 capsule 4  . ondansetron (ZOFRAN) 8 MG tablet TAKE 1 TABLET BY MOUTH 2 TIMES DAILY. START DAY AFTER CHEMO FOR 2 DAYS. THEN TAKE AS NEEDED NAUSEA 30 tablet 1  . traMADol (ULTRAM) 50 MG tablet Take 1-2 tablets (50-100 mg total) by mouth every 6 (six) hours as needed. 120 tablet 1  . valACYclovir (VALTREX) 1000 MG tablet Take 1 tablet (1,000 mg total) by mouth daily. 60 tablet 4  . fluconazole (DIFLUCAN) 100 MG tablet Take 1 tablet (100 mg total) by mouth daily. (Patient not taking: Reported on 02/09/2016) 30 tablet 1  . nystatin (MYCOSTATIN) 100000 UNIT/ML suspension Take 5 mLs (500,000 Units total) by mouth 4 (four) times daily. (Patient not taking: Reported on 01/12/2016) 240 mL 1  . prochlorperazine (COMPAZINE) 10 MG tablet Take 10 mg by mouth every 6 (six) hours as needed for nausea or vomiting. Reported on 02/09/2016     No current facility-administered medications for this visit.    OBJECTIVE: Young white woman in no acute distress Filed Vitals:   02/09/16 0918  BP: 115/75  Pulse: 105  Temp: 98.9 F (37.2 C)  Resp: 18  Body mass index is 29.98 kg/(m^2).  ECOG: 2 Filed Weights   02/09/16 0918  Weight: 169 lb 3.2 oz (76.749 kg)   Sclerae unicteric, EOMs intact Oropharynx clear, dentition in good repair There is a tender left supraclavicular mass measuring perhaps 1-1/2 cm, without associated erythema. Lungs no rales or rhonchi Heart regular rate and rhythm Abd soft, nontender, positive bowel sounds MSK no focal spinal tenderness, no upper extremity lymphedema Neuro: nonfocal, well oriented, anxious affect Breasts: deferred  LAB RESULTS:  CBC Latest Ref Rng 02/09/2016 02/02/2016 01/26/2016  WBC 3.9 - 10.3 10e3/uL 1.6(L) 0.8(LL) 4.0  Hemoglobin 11.6 - 15.9 g/dL 9.3(L) 6.8(LL) 10.2(L)  Hematocrit 34.8 - 46.6 % 28.5(L) 21.0(L)  31.9(L)  Platelets 145 - 400 10e3/uL 47(L) 13(L) 12(L)   CMP Latest Ref Rng 02/09/2016 02/02/2016 01/26/2016  Glucose 70 - 140 mg/dl 106 119 106  BUN 7.0 - 26.0 mg/dL 4.5(L) 11.0 11.3  Creatinine 0.6 - 1.1 mg/dL 0.7 0.7 0.6  Sodium 136 - 145 mEq/L 140 139 140  Potassium 3.5 - 5.1 mEq/L 3.8 4.2 3.3(L)  CO2 22 - 29  mEq/L 28 30(H) 31(H)  Calcium 8.4 - 10.4 mg/dL 8.2(L) 8.8 8.5  Total Protein 6.4 - 8.3 g/dL 4.8(L) 4.8(L) 4.9(L)  Total Bilirubin 0.20 - 1.20 mg/dL 0.94 1.05 2.10(H)  Alkaline Phos 40 - 150 U/L 98 103 153(H)  AST 5 - 34 U/L _0 ALT 0 - 55 U/L _1 STUDIES: Ct Chest W Contrast  02/01/2016  CLINICAL DATA:  Breast cancer with hepatic metastatic disease. Recent new round of chemotherapy, restaging. EXAM: CT CHEST, ABDOMEN, AND PELVIS WITH CONTRAST TECHNIQUE: Multidetector CT imaging of the chest, abdomen and pelvis was performed following the standard protocol during bolus administration of intravenous contrast. CONTRAST:  31m OMNIPAQUE IOHEXOL 300 MG/ML SOLN, 1022mOMNIPAQUE IOHEXOL 300 MG/ML SOLN COMPARISON:  11/23/2015 FINDINGS: CT CHEST FINDINGS Mediastinum/Nodes: Left supraclavicular node, 1.3 cm in short axis on image 6 series 2, previously measured at 1.2 cm in short axis. Aberrant right subclavian artery passes behind the esophagus. Bilateral breast implants interposed between the pectoralis muscles. Left axillary clips. Thin calcified subcarinal node, stable. Lungs/Pleura: Small left pleural effusion. Pleural thickening anteriorly on the left side along the axilla, but less thickening than previous, currently 1.7 cm in thickness, previously 2.2 cm. There is some mild nodularity along the anteromedial pleural margin on the left with faint calcification. This is similar to prior. Trace right pleural fluid observed with a somewhat pedunculated pleural-based lesion extending down towards the lung measuring 2.2 by 1.7 cm on image 27 series 4, previously 2.1 by 1.6 cm. As  before, there is peripheral reticulonodular opacities in the lungs with secondary interstitial lobular thickening. Lobularity of the pleural adipose tissue along the right hemidiaphragm appears increased, with slight nodularity. A sub solid subpleural left apical nodule measures 1.1 by 0.8 cm on image 9 series 4, previously 0.8 by 0.5 cm. Musculoskeletal: Unremarkable CT ABDOMEN PELVIS FINDINGS Hepatobiliary: Perihepatic ascites with hypodensity anteriorly in the right hepatic lobe previously 2.0 by 2.0 cm by my measurements on parasagittal images, and currently 1.5 by 1.8 cm on image 31 series 303. Diffuse hepatic steatosis. Pancreas: Primarily atrophic pancreas but with indistinct low-density lymph nodes difficult separate from the upper margin of the pancreatic head, hypodensity in this vicinity measuring 1.9 by 1.6 cm, previously 1.7 by 2.3 cm by my measurements. Spleen: Unremarkable Adrenals/Urinary Tract: Unremarkable Stomach/Bowel: Unremarkable Vascular/Lymphatic: Indistinct speckled hypodensity in calcification in the porta hepatis and upper peripancreatic region above the pancreatic head, not appreciably changed from prior. This likely represents partially calcified adenopathy. Reproductive: Uterus absent.  Ovaries not well seen. Other: Pelvic ascites noted. Perihepatic and perisplenic ascites along with a small amount of ascites in the right paracolic gutter. Musculoskeletal: Unremarkable IMPRESSION: 1. Although greatly improved from June 2016, compared to 11/23/15 there is a generally mixed appearance, with improvement in size of an anterior hepatic hypodense lesion, but with slight enlargement of a left upper lobe lesion and of a pleural-based right upper lobe lesion. Essentially stable left supraclavicular lymph node remains enlarged. 2. Small left and trace right pleural effusions. Mild to moderate abdominal ascites. 3. Essentially stable appearance of hypodense conglomerate lymph nodes with speckled  calcification along the porta hepatis and upper margin of the pancreatic head. A cystic lesion of the pancreatic head appears to correspond to a prior metastatic lesion. Electronically Signed   By: WaVan Clines.D.   On: 02/01/2016 15:20   Ct Abdomen Pelvis W Contrast  02/01/2016  CLINICAL DATA:  Breast cancer with hepatic metastatic disease.  Recent new round of chemotherapy, restaging. EXAM: CT CHEST, ABDOMEN, AND PELVIS WITH CONTRAST TECHNIQUE: Multidetector CT imaging of the chest, abdomen and pelvis was performed following the standard protocol during bolus administration of intravenous contrast. CONTRAST:  4m OMNIPAQUE IOHEXOL 300 MG/ML SOLN, 1066mOMNIPAQUE IOHEXOL 300 MG/ML SOLN COMPARISON:  11/23/2015 FINDINGS: CT CHEST FINDINGS Mediastinum/Nodes: Left supraclavicular node, 1.3 cm in short axis on image 6 series 2, previously measured at 1.2 cm in short axis. Aberrant right subclavian artery passes behind the esophagus. Bilateral breast implants interposed between the pectoralis muscles. Left axillary clips. Thin calcified subcarinal node, stable. Lungs/Pleura: Small left pleural effusion. Pleural thickening anteriorly on the left side along the axilla, but less thickening than previous, currently 1.7 cm in thickness, previously 2.2 cm. There is some mild nodularity along the anteromedial pleural margin on the left with faint calcification. This is similar to prior. Trace right pleural fluid observed with a somewhat pedunculated pleural-based lesion extending down towards the lung measuring 2.2 by 1.7 cm on image 27 series 4, previously 2.1 by 1.6 cm. As before, there is peripheral reticulonodular opacities in the lungs with secondary interstitial lobular thickening. Lobularity of the pleural adipose tissue along the right hemidiaphragm appears increased, with slight nodularity. A sub solid subpleural left apical nodule measures 1.1 by 0.8 cm on image 9 series 4, previously 0.8 by 0.5 cm.  Musculoskeletal: Unremarkable CT ABDOMEN PELVIS FINDINGS Hepatobiliary: Perihepatic ascites with hypodensity anteriorly in the right hepatic lobe previously 2.0 by 2.0 cm by my measurements on parasagittal images, and currently 1.5 by 1.8 cm on image 31 series 303. Diffuse hepatic steatosis. Pancreas: Primarily atrophic pancreas but with indistinct low-density lymph nodes difficult separate from the upper margin of the pancreatic head, hypodensity in this vicinity measuring 1.9 by 1.6 cm, previously 1.7 by 2.3 cm by my measurements. Spleen: Unremarkable Adrenals/Urinary Tract: Unremarkable Stomach/Bowel: Unremarkable Vascular/Lymphatic: Indistinct speckled hypodensity in calcification in the porta hepatis and upper peripancreatic region above the pancreatic head, not appreciably changed from prior. This likely represents partially calcified adenopathy. Reproductive: Uterus absent.  Ovaries not well seen. Other: Pelvic ascites noted. Perihepatic and perisplenic ascites along with a small amount of ascites in the right paracolic gutter. Musculoskeletal: Unremarkable IMPRESSION: 1. Although greatly improved from June 2016, compared to 11/23/15 there is a generally mixed appearance, with improvement in size of an anterior hepatic hypodense lesion, but with slight enlargement of a left upper lobe lesion and of a pleural-based right upper lobe lesion. Essentially stable left supraclavicular lymph node remains enlarged. 2. Small left and trace right pleural effusions. Mild to moderate abdominal ascites. 3. Essentially stable appearance of hypodense conglomerate lymph nodes with speckled calcification along the porta hepatis and upper margin of the pancreatic head. A cystic lesion of the pancreatic head appears to correspond to a prior metastatic lesion. Electronically Signed   By: WaVan Clines.D.   On: 02/01/2016 15:20    Transthoracic Echocardiography  (Report amended )  Patient:  LyRooney, GladwinR #:     [id number] Date: 02/08/2016 Gender:   F Age:    [age]:   160 cm Weight:   77.1 kg BSA:    1.88 m^2 Pt. Status: Room:  ATTENDING  Magrinat, GuValli Glance Magrinat, GuVirgie DadEFERRING  Magrinat, GuWoodlynneRDCS PERFORMING  [facility], Outpatient  cc:  -------------------------------------------------------------------  ------------------------------------------------------------------- Indications:   Neoplasm - breast 174.9.  ------------------------------------------------------------------- History:  PMH: Follow up monitoring for chemotherapy.  -------------------------------------------------------------------  Study Conclusions  - Left ventricle: LVEF is approximately 40 to 45% with mild hypokinesis worse at inferoseptal, distal lateral and apical walls. Pedunculated mass measuring 16 by 13 mm at apex consistent with thrombus, cannot exclude tumor. Consider cardiac MRI to help differentiate. Global LV strain -15   ASSESSMENT: 50 y.o. [place] woman  (1)  status post left lumpectomy under the care of Dr. 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 2 of 23 lymph nodes were involved.  Margins were clear.  (2) status post adjuvant chemotherapy consisting of 6 q. three-week doses of docetaxel/ doxorubicin/ cyclophosphamide given between January 2007 and may of 2007, with last dose on 04/20/2006.  (3) status post radiation therapy to the left breast, completed 07/11/2006  (4) began on tamoxifen in early August 2007 and continued until October 2009. She status post hysterectomy with bilateral salpingo-oophorectomy on 07/15/2008, and was started on letrozole in October of 2009.  (5) mammogram 11/10/2009 showed microcalcifications in the left breast, and a subsequent biopsy on 11/18/2009 confirmed invasive  mammary carcinoma in the left breast. PET and CT of the chest showed no evidence of metastasis in January 2011.  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 Margins were clear. Patient underwent concurrent left latissimus flap reconstruction and right implant reconstruction.  (7)  status post additional chemotherapy with one cycle of carboplatin/gemcitabine given on 03/11/2010. She then received 5 q. three-week cycles of IV CMF between 03/25/2010 and 06/17/2010.  (8) started on exemestane in January 2012 and continued until late November 2013 when she was hospitalized for apparent TIA at which time her exemestane was stopped and not resumed  (9) METASTATIC DISEASE: evidence of disease recurence noted on chest CT, liver MRI and PET scan in December 2014, with suspicious lung nodules, lymphadenopathy, and 3 lesions in the right lobe of the liver, but no evidence of bony disease and no evidence of brain involvement by brain MRI September 2014. Biopsy of a left supraclavicular lymph node on 12/11/2013 confirmed metastatic carcinoma, estrogen receptor 45% positive, progesterone receptor 17% positive, with no HER-2 amplification.  (10) fulvestrant started 12/03/2013, discontinued 03/25/2014 with progression  (11) exemestane/ everolimus started 04/04/2014; everolimus held 04/14/2014, with skin rash and mouth soreness; resumed at 5 mg a day as of 04/29/2014, discontinued 05/09/2014 with similar symptoms  (12) continuing exemestane, adding palbociclib 05/26/2014;   (a) palbociclib dose dropped to 75 mg every other day for 21 days beginning with cycle 4  (b) exemestane and Palbociclib discontinued December 2015 with evidence of progression  (13) [facility] referral placed 04/28/2049 -- re-sent  05/23/2014-- shows Pi3KCA and TP53 mutations  (a) [facility] one test requested 12/10/2014-- confirms [employer] results  (b) did not qualify for capecitabine/ BYL 719 trial because of elevated lipase  (14) started eribulin 12/26/2014, 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   (a) dose dropped from 2g BID to 1.5g BID starting on 04/26/15  (b) stopped 05/17/2015 (after 4 cycles) with progression  (16) started carboplatin/ gemcitabine 05/26/2015, given day one and day 8 of each 21 day cycle for a  total of 8 cycles, last dose 11/10/2015, stopped because of persistent cytopenias  (a) given her history of severe neutropenia with this chemotherapy we'll do Neupogen days 2, 3 and 4 of each cycle and onpro day 9  (b) carbo dose dropped by 25% w cycle 2 due to very low platelet nadir  (c) CT scans after 3 cycles (07/27/2015) show measurable response  (d) CT scans after 6 cycles (09/28/2015) show continuing response  (e) CT scans post-treatment (11/23/2015) shows continuing response  (17) Left pleural effusion\   (a) s/p L thoracentesis 05/22/2015, 05/29/2015 and 06/05/2015  (b) attempt at pleurx 06/12/2015 cancelled as effusion loculated and scant  (18) symptomatic anemia  (a) Aranesp started 07/28/2015, retic <1% on 10/06/2015  (b) Feraheme given 08/04/2015; ferritin >2K on 10/06/2015, last dose on 11/1  (19) liposomal doxorubicin started 11/24/2015, discontinued after 3d dose 01/19/2016 with decreased EF  (a) echo 03/23/2015 shows an EF of 55%  (b) echo 02/08/2016 shows EF 40-45% and 1.6 cm apical pedunculated mass  (20) vinorelbine to start 02/16/2016, given days 1 and 8 each 21 day cycle  (21) chronic pain: controlled on methadone 2.5 mg po TID with tramadol for breakthrough pain  (a) methylphenidate 5 mg w breakfast  added for excessive somnolence  (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 We spent approximately 45 minutes discussing her new problems. Taking the problems 1 at the time:  We cannot continue the liposomal doxorubicin given the drop in the ejection fraction. We discussed several options today including exam prior, cis-platinum, going back to carboplatin/etoposide, or even trying to obtain pembrolizumab for her. We are going to go with vinorelbine. She has a good understanding of the possible toxicities, side effects and complications of this agent and has been asked to start MiraLAX and stool softeners her soon as she receives the first dose which will be a week from today. The plan is to treat on days 1 and 8 of each 21 day cycle and restage after 3 cycles.  Her pain is much better controlled on methadone 2.5 mg 3 times a day and she rarely uses tramadol for breakthrough pain. The main issue here is that this was making her a little sleepy. She is now on methylphenidate 5 mg with breakfast and that is working quite well.  She is having some unusual side effects which may or may not be due to the methylphenidate. We are continuing that for now and following.  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 If this is tumor we are going to be treating it when she starts the vinorelbine. If it is a clot however she is at high risk of a significant stroke or other embolic event. For that reason even though her platelet count is slightly under 50,000 today we are starting her on Lovenox. She has a good understanding of the risk of bleeding. We are going to try to get home health nursing to bring the Lovenox as well as all the materials to administer it and also teach her husband or another relative to administer it.  Hopefully we can I will not in place within the next 2 days. Meanwhile I have scheduled her for Lovenox here today and tomorrow.  Finally, so that we can hopefully start the the vinorelebine on time next week she will receive Neupogen  today and tomorrow.[place]   As a good understanding of this plan. She is in agreement with that. She knows to call for any problems that may develop for the next visit here.   Chauncey Cruel, MD   02/09/2016 9:57 AM

## 2016-02-10 ENCOUNTER — Telehealth: Payer: Self-pay | Admitting: *Deleted

## 2016-02-10 ENCOUNTER — Ambulatory Visit (HOSPITAL_COMMUNITY)
Admission: RE | Admit: 2016-02-10 | Discharge: 2016-02-10 | Disposition: A | Discharge status: Home / Self Care | Payer: 59 | Source: Ambulatory Visit | Attending: Oncology | Admitting: Oncology

## 2016-02-10 ENCOUNTER — Ambulatory Visit: Payer: 59

## 2016-02-10 NOTE — Telephone Encounter (Signed)
Called Kristin Hoffman about Granix injection.  She was scheduled for Granix 2/28 and 3/1.  She was also scheduled for Lovenox injections.  Home health nurse came in and gave her the Lovenox,  She forgot about the Granix injection.  Rescheduled her to come in tomorrow for the second injection.  Dr Magrinat's nurse made aware of this.

## 2016-02-11 ENCOUNTER — Ambulatory Visit (HOSPITAL_BASED_OUTPATIENT_CLINIC_OR_DEPARTMENT_OTHER): Payer: 59

## 2016-02-11 VITALS — BP 119/75 | HR 104 | Temp 99.0°F

## 2016-02-11 DIAGNOSIS — C50312 Malignant neoplasm of lower-inner quadrant of left female breast: Secondary | ICD-10-CM

## 2016-02-11 DIAGNOSIS — D702 Other drug-induced agranulocytosis: Secondary | ICD-10-CM | POA: Diagnosis not present

## 2016-02-11 DIAGNOSIS — C50912 Malignant neoplasm of unspecified site of left female breast: Secondary | ICD-10-CM

## 2016-02-11 DIAGNOSIS — C50919 Malignant neoplasm of unspecified site of unspecified female breast: Secondary | ICD-10-CM

## 2016-02-11 MED ORDER — TBO-FILGRASTIM 480 MCG/0.8ML ~~LOC~~ SOSY
480.0000 ug | PREFILLED_SYRINGE | Freq: Once | SUBCUTANEOUS | Status: AC
  Administered 2016-02-11: 480 ug via SUBCUTANEOUS
  Filled 2016-02-11: qty 0.8

## 2016-02-11 MED ORDER — FILGRASTIM 480 MCG/0.8ML IJ SOSY: 480.0000 ug | PREFILLED_SYRINGE | Freq: Once | INTRAMUSCULAR | Status: DC

## 2016-02-16 ENCOUNTER — Other Ambulatory Visit (HOSPITAL_BASED_OUTPATIENT_CLINIC_OR_DEPARTMENT_OTHER): Payer: 59

## 2016-02-16 ENCOUNTER — Ambulatory Visit: Payer: 59

## 2016-02-16 ENCOUNTER — Ambulatory Visit (HOSPITAL_BASED_OUTPATIENT_CLINIC_OR_DEPARTMENT_OTHER): Payer: 59 | Admitting: Nurse Practitioner

## 2016-02-16 ENCOUNTER — Encounter: Payer: Self-pay | Admitting: Nurse Practitioner

## 2016-02-16 ENCOUNTER — Ambulatory Visit (HOSPITAL_BASED_OUTPATIENT_CLINIC_OR_DEPARTMENT_OTHER): Payer: 59

## 2016-02-16 VITALS — BP 126/81 | HR 106 | Temp 98.8°F | Resp 18 | Ht 63.0 in | Wt 163.3 lb

## 2016-02-16 DIAGNOSIS — C50912 Malignant neoplasm of unspecified site of left female breast: Secondary | ICD-10-CM

## 2016-02-16 DIAGNOSIS — D649 Anemia, unspecified: Secondary | ICD-10-CM

## 2016-02-16 DIAGNOSIS — Z5111 Encounter for antineoplastic chemotherapy: Secondary | ICD-10-CM | POA: Diagnosis not present

## 2016-02-16 DIAGNOSIS — K5900 Constipation, unspecified: Secondary | ICD-10-CM | POA: Diagnosis not present

## 2016-02-16 DIAGNOSIS — C50919 Malignant neoplasm of unspecified site of unspecified female breast: Secondary | ICD-10-CM

## 2016-02-16 DIAGNOSIS — I5189 Other ill-defined heart diseases: Secondary | ICD-10-CM | POA: Diagnosis not present

## 2016-02-16 DIAGNOSIS — C50312 Malignant neoplasm of lower-inner quadrant of left female breast: Secondary | ICD-10-CM

## 2016-02-16 DIAGNOSIS — Z95828 Presence of other vascular implants and grafts: Secondary | ICD-10-CM

## 2016-02-16 LAB — CBC WITH DIFFERENTIAL/PLATELET
BASO%: 0.2 % (ref 0.0–2.0)
BASOS ABS: 0 10*3/uL (ref 0.0–0.1)
EOS ABS: 0 10*3/uL (ref 0.0–0.5)
EOS%: 0.2 % (ref 0.0–7.0)
HEMATOCRIT: 30.8 % — AB (ref 34.8–46.6)
HGB: 9.9 g/dL — ABNORMAL LOW (ref 11.6–15.9)
LYMPH%: 12 % — AB (ref 14.0–49.7)
MCH: 35.9 pg — ABNORMAL HIGH (ref 25.1–34.0)
MCHC: 32.3 g/dL (ref 31.5–36.0)
MCV: 111.3 fL — AB (ref 79.5–101.0)
MONO#: 0.6 10*3/uL (ref 0.1–0.9)
MONO%: 18.5 % — ABNORMAL HIGH (ref 0.0–14.0)
NEUT#: 2.1 10*3/uL (ref 1.5–6.5)
NEUT%: 69.1 % (ref 38.4–76.8)
PLATELETS: 44 10*3/uL — AB (ref 145–400)
RBC: 2.76 10*6/uL — AB (ref 3.70–5.45)
RDW: 26.3 % — ABNORMAL HIGH (ref 11.2–14.5)
WBC: 3 10*3/uL — ABNORMAL LOW (ref 3.9–10.3)
lymph#: 0.4 10*3/uL — ABNORMAL LOW (ref 0.9–3.3)

## 2016-02-16 LAB — COMPREHENSIVE METABOLIC PANEL
ALBUMIN: 2.7 g/dL — AB (ref 3.5–5.0)
ALT: 12 U/L (ref 0–55)
AST: 24 U/L (ref 5–34)
Alkaline Phosphatase: 105 U/L (ref 40–150)
Anion Gap: 7 mEq/L (ref 3–11)
BILIRUBIN TOTAL: 1.07 mg/dL (ref 0.20–1.20)
BUN: 8.5 mg/dL (ref 7.0–26.0)
CO2: 29 meq/L (ref 22–29)
CREATININE: 0.7 mg/dL (ref 0.6–1.1)
Calcium: 8.2 mg/dL — ABNORMAL LOW (ref 8.4–10.4)
Chloride: 105 mEq/L (ref 98–109)
EGFR: >90 mL/min/{1.73_m2} (ref >90)
GLUCOSE: 127 mg/dL (ref 70–140)
Potassium: 3.5 mEq/L (ref 3.5–5.1)
SODIUM: 141 meq/L (ref 136–145)
TOTAL PROTEIN: 4.8 g/dL — AB (ref 6.4–8.3)

## 2016-02-16 MED ORDER — SODIUM CHLORIDE 0.9% FLUSH
10.0000 mL | INTRAVENOUS | Status: DC | PRN
  Filled 2016-02-16: qty 10

## 2016-02-16 MED ORDER — DARBEPOETIN ALFA 500 MCG/ML IJ SOSY
500.0000 ug | PREFILLED_SYRINGE | Freq: Once | INTRAMUSCULAR | Status: AC
  Administered 2016-02-16: 500 ug via SUBCUTANEOUS
  Filled 2016-02-16: qty 1

## 2016-02-16 MED ORDER — SODIUM CHLORIDE 0.9% FLUSH
10.0000 mL | INTRAVENOUS | Status: DC | PRN
  Administered 2016-02-16: 10 mL
  Filled 2016-02-16: qty 10

## 2016-02-16 MED ORDER — SODIUM CHLORIDE 0.9 % IV SOLN
Freq: Once | INTRAVENOUS | Status: AC
  Administered 2016-02-16: 15:00:00 via INTRAVENOUS

## 2016-02-16 MED ORDER — PROCHLORPERAZINE MALEATE 10 MG PO TABS
10.0000 mg | ORAL_TABLET | Freq: Once | ORAL | Status: AC
  Administered 2016-02-16: 10 mg via ORAL

## 2016-02-16 MED ORDER — VINORELBINE TARTRATE CHEMO INJECTION 50 MG/5ML
27.0000 mg/m2 | Freq: Once | INTRAVENOUS | Status: AC
  Administered 2016-02-16: 50 mg via INTRAVENOUS
  Filled 2016-02-16: qty 5

## 2016-02-16 MED ORDER — HEPARIN SOD (PORK) LOCK FLUSH 100 UNIT/ML IV SOLN
500.0000 [IU] | Freq: Once | INTRAVENOUS | Status: AC | PRN
  Administered 2016-02-16: 500 [IU]
  Filled 2016-02-16: qty 5

## 2016-02-16 MED ORDER — PROCHLORPERAZINE MALEATE 10 MG PO TABS
ORAL_TABLET | ORAL | Status: AC
  Filled 2016-02-16: qty 1

## 2016-02-16 NOTE — Progress Notes (Signed)
Ok to treat today with Platelets 44 per Gentry Fitz, NP.

## 2016-02-16 NOTE — Patient Instructions (Addendum)
[facility] Discharge Instructions for Patients Receiving Chemotherapy  Today you received the following chemotherapy agents; Navelbine.   To help prevent nausea and vomiting after your treatment, we encourage you to take your nausea medication as directed.    If you develop nausea and vomiting that is not controlled by your nausea medication, call the clinic.   BELOW ARE SYMPTOMS THAT SHOULD BE REPORTED IMMEDIATELY:  *FEVER GREATER THAN 100.5 F  *CHILLS WITH OR WITHOUT FEVER  NAUSEA AND VOMITING THAT IS NOT CONTROLLED WITH YOUR NAUSEA MEDICATION  *UNUSUAL SHORTNESS OF BREATH  *UNUSUAL BRUISING OR BLEEDING  TENDERNESS IN MOUTH AND THROAT WITH OR WITHOUT PRESENCE OF ULCERS  *URINARY PROBLEMS  *BOWEL PROBLEMS  UNUSUAL RASH Items with * indicate a potential emergency and should be followed up as soon as possible.  Feel free to call the clinic you have any questions or concerns. The clinic phone number is [phone].  Please show the [facility] at check-in to the Emergency Department and triage nurse.  Vinorelbine injection What is this medicine? VINORELBINE (vi NOR el been) is a chemotherapy drug. It targets fast dividing cells, like cancer cells, and causes these cells to die. This medicine is used to treat cancer, like lung cancer. This medicine may be used for other purposes; ask your health care provider or pharmacist if you have questions. What should I tell my health care provider before I take this medicine? They need to know if you have any of these conditions: -blood disorders -infection (especially chickenpox and herpes) -liver disease -lung disease -nervous system disease -previous or current radiation therapy -an unusual or allergic reaction to vinorelbine, other chemotherapy agents, other medicines, foods, dyes, or preservatives -pregnant or trying to get pregnant -breast-feeding How should I use this medicine? This drug is given as  an infusion into a vein. It is administered in a hospital or clinic by a specially trained health care professional. If you have pain, swelling, burning or any unusual feeling around the site of your injection, tell your health care professional right away. Talk to your pediatrician regarding the use of this medicine in children. Special care may be needed. Overdosage: If you think you have taken too much of this medicine contact a poison control center or emergency room at once. NOTE: This medicine is only for you. Do not share this medicine with others. What if I miss a dose? It is important not to miss your dose. Call your doctor or health care professional if you are unable to keep an appointment. What may interact with this medicine? Do not take this medicine with any of the following medications: -itraconazole -voriconazole This medicine may also interact with the following medications: -cyclosporine -erythromycin -fluconazole -ketoconazole -medicines for HIV like delavirdine, efavirenz, nevirapine -medicines for seizures like ethotoin, fosphenotoin, phenytoin -medicines to increase blood counts like filgrastim, pegfilgrastim, sargramostim -other chemotherapy drugs like cisplatin, mitomycin, paclitaxel -vaccines Talk to your doctor or health care professional before taking any of these medicines: -acetaminophen -aspirin -ibuprofen -ketoprofen -naproxen This list may not describe all possible interactions. Give your health care provider a list of all the medicines, herbs, non-prescription drugs, or dietary supplements you use. Also tell them if you smoke, drink alcohol, or use illegal drugs. Some items may interact with your medicine. What should I watch for while using this medicine? Your condition will be monitored carefully while you are receiving this medicine. You will need important blood work done while you are taking this  medicine. This drug may make you feel generally  unwell. This is not uncommon, as chemotherapy can affect healthy cells as well as cancer cells. Report any side effects. Continue your course of treatment even though you feel ill unless your doctor tells you to stop. In some cases, you may be given additional medicines to help with side effects. Follow all directions for their use. Call your doctor or health care professional for advice if you get a fever, chills or sore throat, or other symptoms of a cold or flu. Do not treat yourself. This drug decreases your body's ability to fight infections. Try to avoid being around people who are sick. This medicine may increase your risk to bruise or bleed. Call your doctor or health care professional if you notice any unusual bleeding. Be careful brushing and flossing your teeth or using a toothpick because you may get an infection or bleed more easily. If you have any dental work done, tell your dentist you are receiving this medicine. Avoid taking products that contain aspirin, acetaminophen, ibuprofen, naproxen, or ketoprofen unless instructed by your doctor. These medicines may hide a fever. Do not become pregnant while taking this medicine. Women should inform their doctor if they wish to become pregnant or think they might be pregnant. There is a potential for serious side effects to an unborn child. Talk to your health care professional or pharmacist for more information. Do not breast-feed an infant while taking this medicine. Men must use a latex condom during sexual contact with a woman while taking this medicine and for 3 months after you stop taking this medicine. A latex condom is needed even if you have had a vasectomy. Contact your doctor right away if your partner becomes pregnant. Do not donate sperm while taking this medicine and for 3 months after you stop taking this medicine. Men should inform their doctors if they wish to father a child. This medicine may lower sperm counts. What side effects  may I notice from receiving this medicine? Side effects that you should report to your doctor or health care professional as soon as possible: -allergic reactions like skin rash, itching or hives, swelling of the face, lips, or tongue -low blood counts - This drug may decrease the number of white blood cells, red blood cells and platelets. You may be at increased risk for infections and bleeding. -signs of infection - fever or chills, cough, sore throat, pain or difficulty passing urine -signs of decreased platelets or bleeding - bruising, pinpoint red spots on the skin, black, tarry stools, nosebleeds -signs of decreased red blood cells - unusually weak or tired, fainting spells, lightheadedness -breathing problems -chest pain -constipation -cough -mouth sores -nausea and vomiting -pain, swelling, redness or irritation at the injection site -pain, tingling, numbness in the hands or feet -stomach pain -trouble passing urine or change in the amount of urine Side effects that usually do not require medical attention (report to your doctor or health care professional if they continue or are bothersome): -diarrhea -hair loss -jaw pain -loss of appetite This list may not describe all possible side effects. Call your doctor for medical advice about side effects. You may report side effects to FDA at 1-800-FDA-1088. Where should I keep my medicine? This drug is given in a hospital or clinic and will not be stored at home. NOTE: This sheet is a summary. It may not cover all possible information. If you have questions about this medicine, talk to your doctor, pharmacist,  or health care provider.    2016, [employer]. (2014-05-28 11:37:59)

## 2016-02-16 NOTE — Patient Instructions (Signed)

## 2016-02-16 NOTE — Progress Notes (Signed)
ID: Kristin Hoffman OB: 11/30/1966  MR#: [id number]  BDZ#:[id number]  PCP: Drake Leach, MD GYN:  [name] SU: [name] OTHER MD: [name], [name]  CHIEF COMPLAINT:  Stage IV breast cancer  CURRENT TREATMENT: vinorelbine, lovenox  BREAST CANCER HISTORY: This patient was previously followed by [name], and was transferred to [name] service as of 08/20/2013.  At the age of 30, the patient had a screening mammogram as a baseline. She had recently given birth and was on birth control pills at that time. The mammogram in November 2006 showed an area of architectural distortion in the left lower inner quadrant. Subsequently an ultrasound was obtained of the left breast showing a 2.0 x 1.5 x 1.4 cm mass, at the 8:00 position, 4 cm from the nipple. A core biopsy performed on 10/21/2005 showed an invasive mammary carcinoma, ER +70%, PR +41%, HER-2/neu negative, with proliferation marker of 38%. [phone]  Breast MRI on 11/08/2005 confirmed a 2.5 cm spiculated mass in the left lower inner quadrant with 3 small satellite nodules adjacent to the primary mass: 3.5 cm posterior medial to the primary mass, measuring 9 mm; 1.5 cm anterior medial to the primary mass measuring 5 mm; and 2 cm medial to the primary mass measuring 5 mm. No axillary adenopathy was noted. No suspicious masses or enhancement are noted in the right breast.  Kristin Hoffman underwentt left lumpectomy under the care of Dr. 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 2 of 23 lymph nodes were involved.  Margins were clear.  She received adjuvant chemotherapy consisting of 6 q. three-week doses of docetaxel/doxorubicin/cyclophosphamide given between January 2007 and may of 2007, with last dose on 04/20/2006. She underwent radiation therapy completed 07/11/2006, after which she began on tamoxifen in early August  2007.  She underwent hysterectomy with bilateral salpingo-oophorectomy on 07/15/2008, and was started on letrozole in October of 2009.  A mammogram 11/10/2009 showed microcalcifications in the left breast, and a subsequent biopsy on 11/18/2009 confirmed invasive mammary carcinoma in the left breast. PET and CT of the chest showed no evidence of metastasis in January 2011.  She underwent bilateral mastectomies 01/25/2010, with the right breast showing no evidence of malignancy; in the left breast showing a 1.0 cm grade 2 invasive ductal carcinoma with high-grade DCIS. Tumor was ER +22%, PR negative, HER-2/neu negative, with proliferation marker of 13%. Margins were clear. Patient underwent concurrent left latissimus flap reconstruction and right implant reconstruction.  She received additional chemotherapy with one cycle of carboplatin/gemcitabine given on 03/11/2010. She then received 5 q. three-week cycles of IV CMF between 03/25/2010 and 06/17/2010.  She was started on exemestane in January 2012 and continued until late November 2013 when she was hospitalized for apparent TIA.   Her subsequent history is as detailed below  INTERVAL HISTORY: Kristin Hoffman returns today for follow up of her stage IV breast cancer. She is is due to start day 1 of vinorelbine today. This is to be given on days 1 and 8, every 21 days. She continues on lovenox daily and does the injections herself. She denies abnormal bruising or bleeding at this time.   REVIEW OF SYSTEMS: Kristin Hoffman denies fevers, chills, nausea, or vomiting. She is chronically constipated, and has been drinking 100% prune juice daily. She uses methadone 2.67m TID and tramadol once or twice daily for breakthrough pain. She has sharp pain to her left  lower back. She also has pain that radiates down her left arm on rare occasions. Her appetite is poor, she is only prompted to eat to take her pain medicine. She naps frequently, but does her best to be present as much as  possible with her sons as they are homeschooled. Her mother helps out during the day. She denies shortness of breath, chest pain, cough, or palpitations. She has no mouth sores or rashes. 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 A detailed review of systems is otherwise stable.  PAST MEDICaL HISTORY: Past Medical History  Diagnosis Date  . Depression   . Reflux   . Hx-TIA (transient ischemic attack) 11/22/2013  . Chronic headaches 11/22/2013  . Anxiety 11/22/2013  . Breast cancer ([place])   . Cancer ([place])   . Breast cancer metastasized to multiple sites [facility]) 12/24/2013    PAST SURGICAL HISTORY: Past Surgical History  Procedure Laterality Date  . Mastectomy w/ nodes partial    . Mastectomy    . Breast reconstruction    . Portacath placement      x 2  . Portacath removal      x 2  . Abdominal hysterectomy    . Tee without cardioversion  11/12/2012    Procedure: TRANSESOPHAGEAL ECHOCARDIOGRAM (TEE);  Surgeon: Candee Furbish, MD;  Location: [facility] ENDOSCOPY;  Service: Cardiovascular;  Laterality: N/A;  This TEE may be Dr. Marlou Porch or a LHC Dr    FAMILY HISTORY Both parents are alive and well. Patient has one sister who is 19 years younger and is in good health. No other history of breast or ovarian cancer in the family. Family History  Problem Relation Age of Onset  . Diabetes Mellitus II Neg Hx   . Hypertension Neg Hx     GYNECOLOGIC HISTORY:   (Updated January 2015) G2P2, menarche at age 89 with irregular menses. On birth control pills in the past. On Clomid to induce ovulation with first pregnancy. Also had preeclampsia with first pregnancy. Status post hysterectomy and bilateral salpingo-oophorectomy in August 2009.  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 She is originally from [place]. She's been married to [place], for 13 years. He works as an Pharmacist, hospital at [employer].   ADVANCED DIRECTIVES:  Not in place. At  A prior clinic visit the patient was given the appropriate forms to complete and notarize at her discretion.    HEALTH MAINTENANCE:  (Updated 11/22/2013) Social History  Substance Use Topics  . Smoking status: Never Smoker   . Smokeless tobacco: Never Used  . Alcohol Use: Yes     Comment: rarely     Colonoscopy:  Never  PAP: S/P LAVH/BSO in August 2009  Bone density: Never  Lipid panel: Not on file   Allergies  Allergen Reactions  . Afinitor [Everolimus] Hives and Itching    Current Outpatient Prescriptions  Medication Sig Dispense Refill  . acidophilus (RISAQUAD) CAPS capsule Take 1 capsule by mouth every morning.     . Biotin 5000 MCG CAPS Take 5,000 mcg by mouth every morning.  30 capsule   . cetirizine (ZYRTEC) 10 MG tablet Take 10 mg by mouth at bedtime.     . Cholecalciferol 2000 UNITS CHEW Chew 1 tablet by mouth daily with breakfast.     . enoxaparin (LOVENOX) 100 MG/ML injection Inject 1 mL (100 mg total) into the skin  daily. 30 Syringe 2  . escitalopram (LEXAPRO) 20 MG tablet Take 1 tablet (20 mg total) by mouth daily. 30 tablet 12  . LORazepam (ATIVAN) 0.5 MG tablet Take one tablet at bedtime as needed for sleep 120 tablet 1  . LOTEMAX 0.5 % ophthalmic suspension Place 1 drop into both eyes 4 (four) times daily. Week 1& 2 - Instill 4 drops per day both eyes Week 3& 4-  Instill 2 drops per day both eyes  plus Soothe XP 2 drops per day both eyes    . Melatonin 10 MG TABS Take 1 tablet by mouth at bedtime.    . methadone (DOLOPHINE) 5 MG tablet Take 1 tablet (5 mg total) by mouth every 8 (eight) hours. 30 tablet 0  . methylphenidate (RITALIN) 5 MG tablet Take one tablet with breakfast and one tablet with lunch 60 tablet 0  . montelukast (SINGULAIR) 10 MG tablet Take 10 mg by mouth daily with breakfast.     . Multiple Vitamin (MULTIVITAMIN) tablet Take 1 tablet by mouth every morning.     Marland Kitchen omeprazole (PRILOSEC) 40 MG  capsule Take 1 capsule (40 mg total) by mouth every morning. 30 capsule 4  . ondansetron (ZOFRAN) 8 MG tablet TAKE 1 TABLET BY MOUTH 2 TIMES DAILY. START DAY AFTER CHEMO FOR 2 DAYS. THEN TAKE AS NEEDED NAUSEA 30 tablet 1  . traMADol (ULTRAM) 50 MG tablet Take 1-2 tablets (50-100 mg total) by mouth every 6 (six) hours as needed. 120 tablet 1  . valACYclovir (VALTREX) 1000 MG tablet Take 1 tablet (1,000 mg total) by mouth daily. 60 tablet 4  . nystatin (MYCOSTATIN) 100000 UNIT/ML suspension Take 5 mLs (500,000 Units total) by mouth 4 (four) times daily. (Patient not taking: Reported on 01/12/2016) 240 mL 1  . prochlorperazine (COMPAZINE) 10 MG tablet Take 10 mg by mouth every 6 (six) hours as needed for nausea or vomiting. Reported on 02/16/2016     No current facility-administered medications for this visit.    OBJECTIVE: Young white woman in no acute distress Filed Vitals:   02/16/16 1319  BP: 126/81  Pulse: 106  Temp: 98.8 F (37.1 C)  Resp: 18  Body mass index is 28.93 kg/(m^2).  ECOG: 2 Filed Weights   02/16/16 1319  Weight: 163 lb 4.8 oz (74.072 kg)   Skin: warm, dry  HEENT: sclerae anicteric, conjunctivae pink, oropharynx clear. No thrush or mucositis.  Lymph Nodes: No cervical lymphadenopathy, small left supraclavicular mass Lungs: clear to auscultation bilaterally, no rales, wheezes, or rhonci  Heart: regular rate and rhythm  Abdomen: round, soft, non tender, positive bowel sounds  Musculoskeletal: No focal spinal tenderness, no peripheral edema  Neuro: non focal, well oriented, positive affect  Breasts: deferred  LAB RESULTS:  CBC Latest Ref Rng 02/16/2016 02/09/2016 02/02/2016  WBC 3.9 - 10.3 10e3/uL 3.0(L) 1.6(L) 0.8(LL)  Hemoglobin 11.6 - 15.9 g/dL 9.9(L) 9.3(L) 6.8(LL)  Hematocrit 34.8 - 46.6 % 30.8(L) 28.5(L) 21.0(L)  Platelets 145 - 400 10e3/uL 44(L) 47(L) 13(L)   CMP Latest Ref Rng 02/16/2016 02/09/2016 02/02/2016  Glucose 70 - 140 mg/dl 127 106 119  BUN 7.0 - 26.0  mg/dL 8.5 4.5(L) 11.0  Creatinine 0.6 - 1.1 mg/dL 0.7 0.7 0.7  Sodium 136 - 145 mEq/L 141 140 139  Potassium 3.5 - 5.1 mEq/L 3.5 3.8 4.2  CO2 22 - 29 mEq/L 29 28 30(H)  Calcium 8.4 - 10.4 mg/dL 8.2(L) 8.2(L) 8.8  Total Protein 6.4 - 8.3 g/dL 4.8(L) 4.8(L) 4.8(L)  Total Bilirubin 0.20 - 1.20 mg/dL 1.07 0.94 1.05  Alkaline Phos 40 - 150 U/L 105 98 103  AST 5 - 34 U/L 24 24 25   ALT 0 - 55 U/L 12 13 14     STUDIES: Ct Chest W Contrast  02/01/2016  CLINICAL DATA:  Breast cancer with hepatic metastatic disease. Recent new round of chemotherapy, restaging. EXAM: CT CHEST, ABDOMEN, AND PELVIS WITH CONTRAST TECHNIQUE: Multidetector CT imaging of the chest, abdomen and pelvis was performed following the standard protocol during bolus administration of intravenous contrast. CONTRAST:  52m OMNIPAQUE IOHEXOL 300 MG/ML SOLN, 1082mOMNIPAQUE IOHEXOL 300 MG/ML SOLN COMPARISON:  11/23/2015 FINDINGS: CT CHEST FINDINGS Mediastinum/Nodes: Left supraclavicular node, 1.3 cm in short axis on image 6 series 2, previously measured at 1.2 cm in short axis. Aberrant right subclavian artery passes behind the esophagus. Bilateral breast implants interposed between the pectoralis muscles. Left axillary clips. Thin calcified subcarinal node, stable. Lungs/Pleura: Small left pleural effusion. Pleural thickening anteriorly on the left side along the axilla, but less thickening than previous, currently 1.7 cm in thickness, previously 2.2 cm. There is some mild nodularity along the anteromedial pleural margin on the left with faint calcification. This is similar to prior. Trace right pleural fluid observed with a somewhat pedunculated pleural-based lesion extending down towards the lung measuring 2.2 by 1.7 cm on image 27 series 4, previously 2.1 by 1.6 cm. As before, there is peripheral reticulonodular opacities in the lungs with secondary interstitial lobular thickening. Lobularity of the pleural adipose tissue along the right  hemidiaphragm appears increased, with slight nodularity. A sub solid subpleural left apical nodule measures 1.1 by 0.8 cm on image 9 series 4, previously 0.8 by 0.5 cm. Musculoskeletal: Unremarkable CT ABDOMEN PELVIS FINDINGS Hepatobiliary: Perihepatic ascites with hypodensity anteriorly in the right hepatic lobe previously 2.0 by 2.0 cm by my measurements on parasagittal images, and currently 1.5 by 1.8 cm on image 31 series 303. Diffuse hepatic steatosis. Pancreas: Primarily atrophic pancreas but with indistinct low-density lymph nodes difficult separate from the upper margin of the pancreatic head, hypodensity in this vicinity measuring 1.9 by 1.6 cm, previously 1.7 by 2.3 cm by my measurements. Spleen: Unremarkable Adrenals/Urinary Tract: Unremarkable Stomach/Bowel: Unremarkable Vascular/Lymphatic: Indistinct speckled hypodensity in calcification in the porta hepatis and upper peripancreatic region above the pancreatic head, not appreciably changed from prior. This likely represents partially calcified adenopathy. Reproductive: Uterus absent.  Ovaries not well seen. Other: Pelvic ascites noted. Perihepatic and perisplenic ascites along with a small amount of ascites in the right paracolic gutter. Musculoskeletal: Unremarkable IMPRESSION: 1. Although greatly improved from June 2016, compared to 11/23/15 there is a generally mixed appearance, with improvement in size of an anterior hepatic hypodense lesion, but with slight enlargement of a left upper lobe lesion and of a pleural-based right upper lobe lesion. Essentially stable left supraclavicular lymph node remains enlarged. 2. Small left and trace right pleural effusions. Mild to moderate abdominal ascites. 3. Essentially stable appearance of hypodense conglomerate lymph nodes with speckled calcification along the porta hepatis and upper margin of the pancreatic head. A cystic lesion of the pancreatic head appears to correspond to a prior metastatic lesion.  Electronically Signed   By: WaVan Clines.D.   On: 02/01/2016 15:20   Ct Abdomen Pelvis W Contrast  02/01/2016  CLINICAL DATA:  Breast cancer with hepatic metastatic disease. Recent new round of chemotherapy, restaging. EXAM: CT CHEST, ABDOMEN, AND PELVIS WITH CONTRAST TECHNIQUE: Multidetector CT imaging of the chest, abdomen and pelvis  was performed following the standard protocol during bolus administration of intravenous contrast. CONTRAST:  49m OMNIPAQUE IOHEXOL 300 MG/ML SOLN, 1037mOMNIPAQUE IOHEXOL 300 MG/ML SOLN COMPARISON:  11/23/2015 FINDINGS: CT CHEST FINDINGS Mediastinum/Nodes: Left supraclavicular node, 1.3 cm in short axis on image 6 series 2, previously measured at 1.2 cm in short axis. Aberrant right subclavian artery passes behind the esophagus. Bilateral breast implants interposed between the pectoralis muscles. Left axillary clips. Thin calcified subcarinal node, stable. Lungs/Pleura: Small left pleural effusion. Pleural thickening anteriorly on the left side along the axilla, but less thickening than previous, currently 1.7 cm in thickness, previously 2.2 cm. There is some mild nodularity along the anteromedial pleural margin on the left with faint calcification. This is similar to prior. Trace right pleural fluid observed with a somewhat pedunculated pleural-based lesion extending down towards the lung measuring 2.2 by 1.7 cm on image 27 series 4, previously 2.1 by 1.6 cm. As before, there is peripheral reticulonodular opacities in the lungs with secondary interstitial lobular thickening. Lobularity of the pleural adipose tissue along the right hemidiaphragm appears increased, with slight nodularity. A sub solid subpleural left apical nodule measures 1.1 by 0.8 cm on image 9 series 4, previously 0.8 by 0.5 cm. Musculoskeletal: Unremarkable CT ABDOMEN PELVIS FINDINGS Hepatobiliary: Perihepatic ascites with hypodensity anteriorly in the right hepatic lobe previously 2.0 by 2.0 cm by my  measurements on parasagittal images, and currently 1.5 by 1.8 cm on image 31 series 303. Diffuse hepatic steatosis. Pancreas: Primarily atrophic pancreas but with indistinct low-density lymph nodes difficult separate from the upper margin of the pancreatic head, hypodensity in this vicinity measuring 1.9 by 1.6 cm, previously 1.7 by 2.3 cm by my measurements. Spleen: Unremarkable Adrenals/Urinary Tract: Unremarkable Stomach/Bowel: Unremarkable Vascular/Lymphatic: Indistinct speckled hypodensity in calcification in the porta hepatis and upper peripancreatic region above the pancreatic head, not appreciably changed from prior. This likely represents partially calcified adenopathy. Reproductive: Uterus absent.  Ovaries not well seen. Other: Pelvic ascites noted. Perihepatic and perisplenic ascites along with a small amount of ascites in the right paracolic gutter. Musculoskeletal: Unremarkable IMPRESSION: 1. Although greatly improved from June 2016, compared to 11/23/15 there is a generally mixed appearance, with improvement in size of an anterior hepatic hypodense lesion, but with slight enlargement of a left upper lobe lesion and of a pleural-based right upper lobe lesion. Essentially stable left supraclavicular lymph node remains enlarged. 2. Small left and trace right pleural effusions. Mild to moderate abdominal ascites. 3. Essentially stable appearance of hypodense conglomerate lymph nodes with speckled calcification along the porta hepatis and upper margin of the pancreatic head. A cystic lesion of the pancreatic head appears to correspond to a prior metastatic lesion. Electronically Signed   By: WaVan Clines.D.   On: 02/01/2016 15:20    Transthoracic Echocardiography  (Report amended )  Patient:  LyLavelle, BerlandR #:    [id number] Date: 02/08/2016 Gender:   F Age:    89eight:   160 cm Weight:   77.1 kg BSA:    1.88 m^2 Pt. Status: Room:  ATTENDING  Magrinat,  GuValli Glance Magrinat, GuVirgie DadEFERRING  Magrinat, GuVirgie DadONOGRAPHER ToTresa ResRDCS PERFORMING  [facility], Outpatient  cc:  -------------------------------------------------------------------  ------------------------------------------------------------------- Indications:   Neoplasm - breast 174.9.  ------------------------------------------------------------------- History:  PMH: Follow up monitoring for chemotherapy.  ------------------------------------------------------------------- Study Conclusions  - Left ventricle: LVEF is approximately 40 to 45% with mild hypokinesis worse at inferoseptal, distal lateral and apical  walls. Pedunculated mass measuring 16 by 13 mm at apex consistent with thrombus, cannot exclude tumor. Consider cardiac MRI to help differentiate. Global LV strain -15   ASSESSMENT: 50 y.o. Kristin Hoffman woman  (1)  status post left lumpectomy under the care of Dr. 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 2 of 23 lymph nodes were involved.  Margins were clear.  (2) status post adjuvant chemotherapy consisting of 6 q. three-week doses of docetaxel/ doxorubicin/ cyclophosphamide given between January 2007 and may of 2007, with last dose on 04/20/2006.  (3) status post radiation therapy to the left breast, completed 07/11/2006  (4) began on tamoxifen in early August 2007 and continued until October 2009. She status post hysterectomy with bilateral salpingo-oophorectomy on 07/15/2008, and was started on letrozole in October of 2009.  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 PET and CT of the chest showed no evidence of metastasis in January 2011.  (6) status post bilateral mastectomies 01/25/2010, with the  right breast showing no evidence of malignancy; in the left breast showing a 1.0 cm grade 2 invasive ductal carcinoma,  ER +22%, PR negative, HER-2/neu negative, with proliferation marker of 13%. Margins were clear. Patient underwent concurrent left latissimus flap reconstruction and right implant reconstruction.  (7)  status post additional chemotherapy with one cycle of carboplatin/gemcitabine given on 03/11/2010. She then received 5 q. three-week cycles of IV CMF between 03/25/2010 and 06/17/2010.  (8) started on exemestane in January 2012 and continued until late November 2013 when she was hospitalized for apparent TIA at which time her exemestane was stopped and not resumed  (9) METASTATIC DISEASE: evidence of disease recurence noted on chest CT, liver MRI and PET scan in December 2014, with suspicious lung nodules, lymphadenopathy, and 3 lesions in the right lobe of the liver, but no evidence of bony disease and no evidence of brain involvement by brain MRI September 2014. Biopsy of a left supraclavicular lymph node on 12/11/2013 confirmed metastatic carcinoma, estrogen receptor 45% positive, progesterone receptor 17% positive, with no HER-2 amplification.  (10) fulvestrant started 12/03/2013, discontinued 03/25/2014 with progression  (11) exemestane/ everolimus started 04/04/2014; everolimus held 04/14/2014, with skin rash and mouth soreness; resumed at 5 mg a day as of 04/29/2014, discontinued 05/09/2014 with similar symptoms  (12) continuing exemestane, adding palbociclib 05/26/2014;   (a) palbociclib dose dropped to 75 mg every other day for 21 days beginning with cycle 4  (b) exemestane and Palbociclib discontinued December 2015 with evidence of progression  (13) [facility] referral placed 04/28/2049 -- re-sent 05/23/2014-- shows Pi3KCA and TP53 mutations  (a) [facility] one test requested 12/10/2014-- confirms [employer] results  (b) did not qualify for capecitabine/ BYL 719 trial because of  elevated lipase  (14) started eribulin 12/26/2014, 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   (a) dose dropped from 2g BID to 1.5g BID starting on 04/26/15  (b) stopped 05/17/2015 (after 4 cycles) with progression  (16) started carboplatin/ gemcitabine 05/26/2015, given day one and day 8 of each 21 day cycle for a total of 8 cycles, last dose 11/10/2015, stopped because of persistent cytopenias  (a) given her history of severe neutropenia with this  chemotherapy we'll do Neupogen days 2, 3 and 4 of each cycle and onpro day 9  (b) carbo dose dropped by 25% w cycle 2 due to very low platelet nadir  (c) CT scans after 3 cycles (07/27/2015) show measurable response  (d) CT scans after 6 cycles (09/28/2015) show continuing response  (e) CT scans post-treatment (11/23/2015) shows continuing response  (17) Left pleural effusion\   (a) s/p L thoracentesis 05/22/2015, 05/29/2015 and 06/05/2015  (b) attempt at pleurx 06/12/2015 cancelled as effusion loculated and scant  (18) symptomatic anemia  (a) Aranesp started 07/28/2015, retic <1% on 10/06/2015  (b) Feraheme given 08/04/2015; ferritin >2K on 10/06/2015, last dose on 11/1  (19) liposomal doxorubicin started 11/24/2015, discontinued after 3d dose 01/19/2016 with decreased EF  (a) echo 03/23/2015 shows an EF of 55%  (b) echo 02/08/2016 shows EF 40-45% and 1.6 cm apical pedunculated mass  (20) vinorelbine to start 02/16/2016, given days 1 and 8 each 21 day cycle  (21) chronic pain: controlled on methadone 2.5 mg po TID with tramadol for breakthrough pain  (a) methylphenidate 5 mg w breakfast added for excessive somnolence  (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 Her platelet count is still low at 44, but we will proceed with cycle 1 of vinorelbine anyway per [name]. She will continue on lovenox daily, despite this low count, due her risk of stroke or embolic event due to the pedunculated mass at the cardiac apex. We have not yet determined if this is a clot or a tumor. She will have a cardiac MRI tomorrow, and [name] will call her on Friday with the results.  I have advised Kristin Hoffman to start her miralax daily and stool softeners BID today, given she is already constipated with regular prune juice use.   Kristin Hoffman will return in 1 week for day 8 of treatment. She understands and agrees with this plan. She knows the goal of treatment in her case is control. She has been encouraged to call with any issues that might arise before her next visit here.    Kristin Panda, NP   02/16/2016 2:16 PM

## 2016-02-18 ENCOUNTER — Ambulatory Visit (HOSPITAL_COMMUNITY)
Admission: RE | Admit: 2016-02-18 | Discharge: 2016-02-18 | Disposition: A | Discharge status: Home / Self Care | Payer: 59 | Source: Ambulatory Visit | Attending: Internal Medicine | Admitting: Internal Medicine

## 2016-02-18 ENCOUNTER — Telehealth: Payer: Self-pay

## 2016-02-18 DIAGNOSIS — I517 Cardiomegaly: Secondary | ICD-10-CM | POA: Insufficient documentation

## 2016-02-18 DIAGNOSIS — I071 Rheumatic tricuspid insufficiency: Secondary | ICD-10-CM | POA: Insufficient documentation

## 2016-02-18 DIAGNOSIS — I340 Nonrheumatic mitral (valve) insufficiency: Secondary | ICD-10-CM | POA: Insufficient documentation

## 2016-02-18 DIAGNOSIS — R221 Localized swelling, mass and lump, neck: Secondary | ICD-10-CM | POA: Insufficient documentation

## 2016-02-18 DIAGNOSIS — C50912 Malignant neoplasm of unspecified site of left female breast: Secondary | ICD-10-CM | POA: Insufficient documentation

## 2016-02-18 MED ORDER — HEPARIN SOD (PORK) LOCK FLUSH 100 UNIT/ML IV SOLN
500.0000 [IU] | INTRAVENOUS | Status: AC | PRN
  Administered 2016-02-18: 500 [IU]

## 2016-02-18 MED ORDER — GADOBENATE DIMEGLUMINE 529 MG/ML IV SOLN
24.0000 mL | Freq: Once | INTRAVENOUS | Status: AC | PRN
  Administered 2016-02-18: 20 mL via INTRAVENOUS

## 2016-02-18 NOTE — Telephone Encounter (Signed)
[place] for chemotherapy F/U.  Lest message for patient to call back if she has any questions or concerns

## 2016-02-18 NOTE — Therapy (Signed)
[place], [place], [address] Phone: [phone]   Fax:  [phone]  Physical Therapy Treatment  Patient Details  Name: Kristin Hoffman MRN: [id number] Date of Birth: Nov 10, 1966 No Data Recorded  Encounter Date: 02/27/2015    Past Medical History  Diagnosis Date  . Depression   . Reflux   . Hx-TIA (transient ischemic attack) 11/22/2013  . Chronic headaches 11/22/2013  . Anxiety 11/22/2013  . Breast cancer ([place])   . Cancer ([place])   . Breast cancer metastasized to multiple sites [facility]) 12/24/2013    Past Surgical History  Procedure Laterality Date  . Mastectomy w/ nodes partial    . Mastectomy    . Breast reconstruction    . Portacath placement      x 2  . Portacath removal      x 2  . Abdominal hysterectomy    . Tee without cardioversion  11/12/2012    Procedure: TRANSESOPHAGEAL ECHOCARDIOGRAM (TEE);  Surgeon: Candee Furbish, MD;  Location: [facility] ENDOSCOPY;  Service: Cardiovascular;  Laterality: N/A;  This TEE may be Dr. Marlou Porch or a LHC Dr    There were no vitals filed for this visit.  Visit Diagnosis:  Peripheral neuropathy due to chemotherapy                                       Long Term Clinic Goals - 02/18/16 1713    CC Long Term Goal  #1   Title Patient will report benefit of at least one modality for decreasing symptoms of CIPN.   Status Not Met   CC Long Term Goal  #2   Title Pt. will be independent in home management of CIPN symptoms.   Status Not Met            Problem List Patient Active Problem List   Diagnosis Date Noted  . Ventricular mural thrombus ([place]) 02/09/2016  . Drug-induced neutropenia ([place]) 01/05/2016  . Fatigue 08/18/2015  . Superficial thrombosis of left lower extremity 08/12/2015  . Cellulitis 08/04/2015  . Thrombocytopenia ([place]) 06/08/2015  . Pleural effusion, left 06/02/2015  . Dyspnea on exertion 05/30/2015  .  Recurrent pleural effusion on left   . Hypoxia 05/29/2015  . Pleural effusion 05/29/2015  . Leukocytosis 05/29/2015  . Prolonged QT interval 05/29/2015  . SOB (shortness of breath) 05/21/2015  . Palmar plantar erythrodysesthesia 05/08/2015  . Diarrhea 04/11/2015  . Nausea with vomiting 04/09/2015  . Mucositis due to chemotherapy 04/09/2015  . Anorexia 04/09/2015  . Neoplastic malignant related fatigue 04/09/2015  . Dehydration 04/09/2015  . 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 Heart burn 02/13/2015  . Back spasm 02/06/2015  . Drug induced neutropenia(288.03) 08/04/2014  . Breast cancer metastasized to multiple sites ([place]) 12/24/2013  . Cancer of lower-inner quadrant of left female breast ([place]) 12/03/2013  . Hx-TIA (transient ischemic attack) 11/22/2013  . Chronic headaches 11/22/2013  . Anxiety 11/22/2013    SALISBURY,DONNA 02/18/2016, 5:13 PM  [place], [place], [address] Phone: [phone]   Fax:  [phone]  Name: Kristin Hoffman MRN: [id number] Date of Birth: 1966/09/13    PHYSICAL THERAPY DISCHARGE SUMMARY  Visits from Start of Care: 2  Current functional level related to goals / functional outcomes: Goals were not met.  A couple of modalities were tried to provide relief for patient's  symptoms.  She was to report back about benefit of therapy but did not.   Remaining deficits: Unknown, as patient discontinued therapy.   Education / Equipment: Modalities available for symptom relief. Plan: Patient agrees to discharge.  Patient goals were not met. Patient is being discharged due to not returning since the last visit.  ?????       Serafina Royals, PT 02/18/2016 5:15 PM

## 2016-02-19 ENCOUNTER — Telehealth: Payer: Self-pay

## 2016-02-19 NOTE — Telephone Encounter (Signed)
Patient's husband calling asking about results that he states [name] was going to call them with today.  He was afraid patient may have missed the call because she was sleeping.

## 2016-02-19 NOTE — Telephone Encounter (Signed)
This RN returned call and obtained identified VM- message left informing pt results have not been released per cardiac MRI.

## 2016-02-22 ENCOUNTER — Telehealth: Payer: Self-pay | Admitting: *Deleted

## 2016-02-22 ENCOUNTER — Emergency Department (HOSPITAL_COMMUNITY): Payer: 59

## 2016-02-22 ENCOUNTER — Encounter (HOSPITAL_COMMUNITY): Payer: Self-pay | Admitting: Emergency Medicine

## 2016-02-22 ENCOUNTER — Other Ambulatory Visit: Payer: Self-pay | Admitting: *Deleted

## 2016-02-22 ENCOUNTER — Other Ambulatory Visit (HOSPITAL_COMMUNITY): Payer: 59

## 2016-02-22 ENCOUNTER — Other Ambulatory Visit: Payer: Self-pay | Admitting: Nurse Practitioner

## 2016-02-22 ENCOUNTER — Inpatient Hospital Stay (HOSPITAL_COMMUNITY)
Admission: EM | Admit: 2016-02-22 | Discharge: 2016-03-05 | DRG: 040 | Disposition: A | Discharge status: Hospice | Payer: 59 | Attending: Internal Medicine | Admitting: Internal Medicine

## 2016-02-22 DIAGNOSIS — I513 Intracardiac thrombosis, not elsewhere classified: Secondary | ICD-10-CM | POA: Diagnosis present

## 2016-02-22 DIAGNOSIS — R400 Somnolence: Secondary | ICD-10-CM | POA: Diagnosis present

## 2016-02-22 DIAGNOSIS — Z853 Personal history of malignant neoplasm of breast: Secondary | ICD-10-CM | POA: Diagnosis not present

## 2016-02-22 DIAGNOSIS — Z6828 Body mass index (BMI) 28.0-28.9, adult: Secondary | ICD-10-CM

## 2016-02-22 DIAGNOSIS — F419 Anxiety disorder, unspecified: Secondary | ICD-10-CM | POA: Diagnosis present

## 2016-02-22 DIAGNOSIS — Z515 Encounter for palliative care: Secondary | ICD-10-CM | POA: Diagnosis not present

## 2016-02-22 DIAGNOSIS — R262 Difficulty in walking, not elsewhere classified: Secondary | ICD-10-CM | POA: Diagnosis present

## 2016-02-22 DIAGNOSIS — R471 Dysarthria and anarthria: Secondary | ICD-10-CM | POA: Diagnosis present

## 2016-02-22 DIAGNOSIS — R4701 Aphasia: Secondary | ICD-10-CM | POA: Diagnosis present

## 2016-02-22 DIAGNOSIS — Z8673 Personal history of transient ischemic attack (TIA), and cerebral infarction without residual deficits: Secondary | ICD-10-CM

## 2016-02-22 DIAGNOSIS — R1110 Vomiting, unspecified: Secondary | ICD-10-CM | POA: Diagnosis not present

## 2016-02-22 DIAGNOSIS — R4781 Slurred speech: Secondary | ICD-10-CM | POA: Diagnosis not present

## 2016-02-22 DIAGNOSIS — K5900 Constipation, unspecified: Secondary | ICD-10-CM | POA: Diagnosis present

## 2016-02-22 DIAGNOSIS — R531 Weakness: Secondary | ICD-10-CM | POA: Diagnosis present

## 2016-02-22 DIAGNOSIS — T451X5A Adverse effect of antineoplastic and immunosuppressive drugs, initial encounter: Secondary | ICD-10-CM | POA: Diagnosis not present

## 2016-02-22 DIAGNOSIS — Z923 Personal history of irradiation: Secondary | ICD-10-CM | POA: Diagnosis not present

## 2016-02-22 DIAGNOSIS — E46 Unspecified protein-calorie malnutrition: Secondary | ICD-10-CM | POA: Diagnosis present

## 2016-02-22 DIAGNOSIS — Z90710 Acquired absence of both cervix and uterus: Secondary | ICD-10-CM | POA: Diagnosis not present

## 2016-02-22 DIAGNOSIS — I213 ST elevation (STEMI) myocardial infarction of unspecified site: Secondary | ICD-10-CM | POA: Diagnosis not present

## 2016-02-22 DIAGNOSIS — D61810 Antineoplastic chemotherapy induced pancytopenia: Secondary | ICD-10-CM | POA: Diagnosis not present

## 2016-02-22 DIAGNOSIS — D696 Thrombocytopenia, unspecified: Secondary | ICD-10-CM | POA: Diagnosis not present

## 2016-02-22 DIAGNOSIS — D709 Neutropenia, unspecified: Secondary | ICD-10-CM | POA: Diagnosis not present

## 2016-02-22 DIAGNOSIS — D72819 Decreased white blood cell count, unspecified: Secondary | ICD-10-CM | POA: Diagnosis not present

## 2016-02-22 DIAGNOSIS — R29810 Facial weakness: Secondary | ICD-10-CM | POA: Diagnosis present

## 2016-02-22 DIAGNOSIS — C50919 Malignant neoplasm of unspecified site of unspecified female breast: Secondary | ICD-10-CM

## 2016-02-22 DIAGNOSIS — Z888 Allergy status to other drugs, medicaments and biological substances status: Secondary | ICD-10-CM

## 2016-02-22 DIAGNOSIS — G936 Cerebral edema: Secondary | ICD-10-CM | POA: Diagnosis present

## 2016-02-22 DIAGNOSIS — R0602 Shortness of breath: Secondary | ICD-10-CM | POA: Diagnosis not present

## 2016-02-22 DIAGNOSIS — I429 Cardiomyopathy, unspecified: Secondary | ICD-10-CM | POA: Diagnosis present

## 2016-02-22 DIAGNOSIS — Z9013 Acquired absence of bilateral breasts and nipples: Secondary | ICD-10-CM

## 2016-02-22 DIAGNOSIS — Z7901 Long term (current) use of anticoagulants: Secondary | ICD-10-CM | POA: Diagnosis not present

## 2016-02-22 DIAGNOSIS — G893 Neoplasm related pain (acute) (chronic): Secondary | ICD-10-CM | POA: Diagnosis present

## 2016-02-22 DIAGNOSIS — E876 Hypokalemia: Secondary | ICD-10-CM | POA: Diagnosis not present

## 2016-02-22 DIAGNOSIS — R4182 Altered mental status, unspecified: Secondary | ICD-10-CM | POA: Diagnosis not present

## 2016-02-22 DIAGNOSIS — Z66 Do not resuscitate: Secondary | ICD-10-CM | POA: Diagnosis present

## 2016-02-22 DIAGNOSIS — C7931 Secondary malignant neoplasm of brain: Principal | ICD-10-CM

## 2016-02-22 DIAGNOSIS — C50912 Malignant neoplasm of unspecified site of left female breast: Secondary | ICD-10-CM

## 2016-02-22 DIAGNOSIS — R12 Heartburn: Secondary | ICD-10-CM

## 2016-02-22 DIAGNOSIS — R110 Nausea: Secondary | ICD-10-CM | POA: Diagnosis not present

## 2016-02-22 DIAGNOSIS — C50911 Malignant neoplasm of unspecified site of right female breast: Secondary | ICD-10-CM

## 2016-02-22 DIAGNOSIS — R197 Diarrhea, unspecified: Secondary | ICD-10-CM | POA: Diagnosis not present

## 2016-02-22 DIAGNOSIS — R109 Unspecified abdominal pain: Secondary | ICD-10-CM | POA: Diagnosis not present

## 2016-02-22 DIAGNOSIS — E43 Unspecified severe protein-calorie malnutrition: Secondary | ICD-10-CM | POA: Diagnosis present

## 2016-02-22 LAB — I-STAT TROPONIN, ED: Troponin i, poc: 0 ng/mL (ref 0.00–0.08)

## 2016-02-22 LAB — CBC WITH DIFFERENTIAL/PLATELET
BASOS PCT: 0 %
Basophils Absolute: 0 10*3/uL (ref 0.0–0.1)
EOS ABS: 0 10*3/uL (ref 0.0–0.7)
EOS PCT: 0 %
HEMATOCRIT: 32.7 % — AB (ref 36.0–46.0)
HEMOGLOBIN: 10.6 g/dL — AB (ref 12.0–15.0)
LYMPHS PCT: 15 %
Lymphs Abs: 0.3 10*3/uL — ABNORMAL LOW (ref 0.7–4.0)
MCH: 37.1 pg — AB (ref 26.0–34.0)
MCHC: 32.4 g/dL (ref 30.0–36.0)
MCV: 114.3 fL — AB (ref 78.0–100.0)
MONOS PCT: 2 %
Monocytes Absolute: 0 10*3/uL — ABNORMAL LOW (ref 0.1–1.0)
NEUTROS ABS: 1.7 10*3/uL (ref 1.7–7.7)
NEUTROS PCT: 83 %
Platelets: 24 10*3/uL — CL (ref 150–400)
RBC: 2.86 MIL/uL — ABNORMAL LOW (ref 3.87–5.11)
RDW: 22.4 % — ABNORMAL HIGH (ref 11.5–15.5)
WBC: 2 10*3/uL — ABNORMAL LOW (ref 4.0–10.5)

## 2016-02-22 LAB — OSMOLALITY: Osmolality: 290 mOsm/kg (ref 275–295)

## 2016-02-22 LAB — URINALYSIS, ROUTINE W REFLEX MICROSCOPIC
Bilirubin Urine: NEGATIVE
Glucose, UA: NEGATIVE mg/dL
HGB URINE DIPSTICK: NEGATIVE
Ketones, ur: NEGATIVE mg/dL
NITRITE: NEGATIVE
PROTEIN: NEGATIVE mg/dL
SPECIFIC GRAVITY, URINE: 1.017 (ref 1.005–1.030)
pH: 7.5 (ref 5.0–8.0)

## 2016-02-22 LAB — COMPREHENSIVE METABOLIC PANEL
ALBUMIN: 2.9 g/dL — AB (ref 3.5–5.0)
ALK PHOS: 78 U/L (ref 38–126)
ALT: 27 U/L (ref 14–54)
ANION GAP: 4 — AB (ref 5–15)
AST: 39 U/L (ref 15–41)
BUN: 12 mg/dL (ref 6–20)
CALCIUM: 8.9 mg/dL (ref 8.9–10.3)
CHLORIDE: 104 mmol/L (ref 101–111)
CO2: 32 mmol/L (ref 22–32)
Creatinine, Ser: 0.38 mg/dL — ABNORMAL LOW (ref 0.44–1.00)
GFR calc non Af Amer: >60 mL/min (ref >60)
GLUCOSE: 129 mg/dL — AB (ref 65–99)
Potassium: 3.4 mmol/L — ABNORMAL LOW (ref 3.5–5.1)
SODIUM: 140 mmol/L (ref 135–145)
Total Bilirubin: 1.1 mg/dL (ref 0.3–1.2)
Total Protein: 5.1 g/dL — ABNORMAL LOW (ref 6.5–8.1)

## 2016-02-22 LAB — AMMONIA: AMMONIA: 42 umol/L — AB (ref 9–35)

## 2016-02-22 LAB — TYPE AND SCREEN
ABO/RH(D): A POS
Antibody Screen: NEGATIVE

## 2016-02-22 LAB — URINE MICROSCOPIC-ADD ON

## 2016-02-22 LAB — I-STAT CG4 LACTIC ACID, ED: LACTIC ACID, VENOUS: 0.97 mmol/L (ref 0.5–2.0)

## 2016-02-22 MED ORDER — DEXAMETHASONE SODIUM PHOSPHATE 10 MG/ML IJ SOLN: 10.0000 mg | Freq: Four times a day (QID) | INTRAMUSCULAR | Status: DC

## 2016-02-22 MED ORDER — ACETAMINOPHEN 650 MG RE SUPP: 650.0000 mg | Freq: Four times a day (QID) | RECTAL | Status: DC | PRN

## 2016-02-22 MED ORDER — MORPHINE SULFATE (PF) 2 MG/ML IV SOLN
2.0000 mg | INTRAVENOUS | Status: DC | PRN
  Administered 2016-02-22 – 2016-03-02 (×17): 2 mg via INTRAVENOUS
  Filled 2016-02-22 (×17): qty 1

## 2016-02-22 MED ORDER — ALUM & MAG HYDROXIDE-SIMETH 200-200-20 MG/5ML PO SUSP: 30.0000 mL | Freq: Four times a day (QID) | ORAL | Status: DC | PRN

## 2016-02-22 MED ORDER — METHADONE HCL 5 MG PO TABS
2.5000 mg | ORAL_TABLET | Freq: Three times a day (TID) | ORAL | Status: DC
  Administered 2016-02-22 – 2016-02-29 (×14): 2.5 mg via ORAL
  Filled 2016-02-22 (×19): qty 1

## 2016-02-22 MED ORDER — MANNITOL 25 % IV SOLN
12.5000 g | Freq: Once | INTRAVENOUS | Status: AC
  Administered 2016-02-22: 12.5 g via INTRAVENOUS
  Filled 2016-02-22 (×2): qty 50

## 2016-02-22 MED ORDER — DIPHENOXYLATE-ATROPINE 2.5-0.025 MG PO TABS: 1.0000 | ORAL_TABLET | Freq: Four times a day (QID) | ORAL | Status: DC | PRN

## 2016-02-22 MED ORDER — HYDROMORPHONE HCL 1 MG/ML IJ SOLN
0.5000 mg | Freq: Once | INTRAMUSCULAR | Status: AC
  Administered 2016-02-22: 0.5 mg via INTRAVENOUS
  Filled 2016-02-22: qty 1

## 2016-02-22 MED ORDER — SODIUM CHLORIDE 0.9 % IV SOLN
INTRAVENOUS | Status: DC
  Administered 2016-02-24 – 2016-02-26 (×5): via INTRAVENOUS
  Administered 2016-02-26: 100 mL/h via INTRAVENOUS
  Administered 2016-02-27 – 2016-03-01 (×4): via INTRAVENOUS
  Administered 2016-03-01: 100 mL/h via INTRAVENOUS
  Administered 2016-03-02: via INTRAVENOUS
  Administered 2016-03-02: 100 mL/h via INTRAVENOUS
  Administered 2016-03-03 – 2016-03-04 (×3): via INTRAVENOUS

## 2016-02-22 MED ORDER — ACETAMINOPHEN 325 MG PO TABS
650.0000 mg | ORAL_TABLET | Freq: Four times a day (QID) | ORAL | Status: DC | PRN
  Administered 2016-02-23: 650 mg via ORAL
  Filled 2016-02-22: qty 2

## 2016-02-22 MED ORDER — DOCUSATE SODIUM 100 MG PO CAPS
100.0000 mg | ORAL_CAPSULE | Freq: Two times a day (BID) | ORAL | Status: DC
  Administered 2016-02-22 – 2016-02-27 (×6): 100 mg via ORAL
  Filled 2016-02-22 (×9): qty 1

## 2016-02-22 MED ORDER — GADOBENATE DIMEGLUMINE 529 MG/ML IV SOLN
15.0000 mL | Freq: Once | INTRAVENOUS | Status: AC | PRN
  Administered 2016-02-22: 20 mL via INTRAVENOUS

## 2016-02-22 MED ORDER — ESCITALOPRAM OXALATE 10 MG PO TABS: 20.0000 mg | ORAL_TABLET | Freq: Every day | ORAL | Status: DC

## 2016-02-22 MED ORDER — TRAMADOL HCL 50 MG PO TABS: 50.0000 mg | ORAL_TABLET | Freq: Four times a day (QID) | ORAL | Status: DC | PRN

## 2016-02-22 MED ORDER — LORAZEPAM 0.5 MG PO TABS: 0.5000 mg | ORAL_TABLET | Freq: Every evening | ORAL | Status: DC | PRN

## 2016-02-22 MED ORDER — SODIUM CHLORIDE 0.9% FLUSH
3.0000 mL | Freq: Two times a day (BID) | INTRAVENOUS | Status: DC
  Administered 2016-02-24 – 2016-02-25 (×3): 3 mL via INTRAVENOUS

## 2016-02-22 MED ORDER — ADULT MULTIVITAMIN W/MINERALS CH
1.0000 | ORAL_TABLET | Freq: Every morning | ORAL | Status: DC
  Filled 2016-02-22 (×6): qty 1

## 2016-02-22 MED ORDER — METHYLPHENIDATE HCL 5 MG PO TABS
5.0000 mg | ORAL_TABLET | Freq: Two times a day (BID) | ORAL | Status: DC
  Administered 2016-02-23 – 2016-02-29 (×9): 5 mg via ORAL
  Filled 2016-02-22 (×12): qty 1

## 2016-02-22 MED ORDER — DEXAMETHASONE SODIUM PHOSPHATE 10 MG/ML IJ SOLN
10.0000 mg | Freq: Four times a day (QID) | INTRAMUSCULAR | Status: DC
  Administered 2016-02-22 – 2016-02-23 (×3): 10 mg via INTRAVENOUS
  Filled 2016-02-22 (×6): qty 1

## 2016-02-22 MED ORDER — SODIUM CHLORIDE 0.9 % IV BOLUS (SEPSIS)
1000.0000 mL | Freq: Once | INTRAVENOUS | Status: AC
  Administered 2016-02-22: 1000 mL via INTRAVENOUS

## 2016-02-22 MED ORDER — ESCITALOPRAM OXALATE 20 MG PO TABS
20.0000 mg | ORAL_TABLET | Freq: Every day | ORAL | Status: DC
  Administered 2016-02-22 – 2016-02-29 (×5): 20 mg via ORAL
  Filled 2016-02-22: qty 2
  Filled 2016-02-22 (×7): qty 1

## 2016-02-22 MED ORDER — ONDANSETRON HCL 4 MG/2ML IJ SOLN
4.0000 mg | Freq: Four times a day (QID) | INTRAMUSCULAR | Status: DC | PRN
  Administered 2016-02-22 – 2016-03-03 (×14): 4 mg via INTRAVENOUS
  Filled 2016-02-22 (×15): qty 2

## 2016-02-22 MED ORDER — MONTELUKAST SODIUM 10 MG PO TABS
10.0000 mg | ORAL_TABLET | Freq: Every day | ORAL | Status: DC
  Administered 2016-02-22 – 2016-02-27 (×5): 10 mg via ORAL
  Filled 2016-02-22 (×6): qty 1

## 2016-02-22 MED ORDER — ENOXAPARIN SODIUM 100 MG/ML ~~LOC~~ SOLN
100.0000 mg | SUBCUTANEOUS | Status: DC
  Administered 2016-02-22 – 2016-03-04 (×11): 100 mg via SUBCUTANEOUS
  Filled 2016-02-22 (×13): qty 1

## 2016-02-22 MED ORDER — RISAQUAD PO CAPS
1.0000 | ORAL_CAPSULE | Freq: Every morning | ORAL | Status: DC
  Filled 2016-02-22 (×11): qty 1

## 2016-02-22 MED ORDER — ONDANSETRON HCL 4 MG/2ML IJ SOLN
4.0000 mg | Freq: Once | INTRAMUSCULAR | Status: AC
  Administered 2016-02-22: 4 mg via INTRAVENOUS
  Filled 2016-02-22: qty 2

## 2016-02-22 MED ORDER — ONDANSETRON HCL 4 MG PO TABS: 4.0000 mg | ORAL_TABLET | Freq: Four times a day (QID) | ORAL | Status: DC | PRN

## 2016-02-22 MED ORDER — SODIUM CHLORIDE 0.9 % IV SOLN
1000.0000 mg | Freq: Two times a day (BID) | INTRAVENOUS | Status: DC
  Administered 2016-02-22 – 2016-02-23 (×4): 1000 mg via INTRAVENOUS
  Filled 2016-02-22 (×6): qty 10

## 2016-02-22 MED ORDER — SODIUM CHLORIDE 0.9 % IV SOLN: Freq: Once | INTRAVENOUS | Status: DC

## 2016-02-22 MED ORDER — HYDROCODONE-ACETAMINOPHEN 5-325 MG PO TABS
1.0000 | ORAL_TABLET | ORAL | Status: DC | PRN
  Administered 2016-02-22: 2 via ORAL
  Administered 2016-02-23 – 2016-02-24 (×2): 1 via ORAL
  Filled 2016-02-22 (×2): qty 1
  Filled 2016-02-22 (×2): qty 2

## 2016-02-22 MED ORDER — DEXAMETHASONE SODIUM PHOSPHATE 10 MG/ML IJ SOLN
10.0000 mg | Freq: Once | INTRAMUSCULAR | Status: AC
  Administered 2016-02-22: 10 mg via INTRAVENOUS
  Filled 2016-02-22: qty 1

## 2016-02-22 MED ORDER — VALACYCLOVIR HCL 500 MG PO TABS
1000.0000 mg | ORAL_TABLET | Freq: Every day | ORAL | Status: DC
  Administered 2016-02-26 – 2016-02-29 (×4): 1000 mg via ORAL
  Filled 2016-02-22 (×13): qty 2

## 2016-02-22 NOTE — ED Notes (Signed)
Critical value Platelet: 24  Notified primary nurse

## 2016-02-22 NOTE — Telephone Encounter (Signed)
Page: Marland KitchenHusband Greg called.  She is having a reaction to chemotherapy from Friday, he thibnks.  Call ASAP."  Kristin Hoffman to learn "[name] has been having side effects since Wednesday 02-17-2016 after [place] 02-16-2016.  She fell Tuesday out side with Valet when she stood up from the bench. They helped her up and she drove herself home.  She has been confused, speech slurred, numbness to her fingers, trouble staying awake and sleeps all the time.  It's like she is under the influence."  Asked if face looks uneven, any trouble moving or using arms or legs and if speech was smooth and clear before Wednesday. "She barely moves her mouth because she sleeps all the time.  I haven't noticed if face is uneven.  She gets up to go to the bathroom but having trouble moving and holding things" Advised call 911 to get to ED.  Consulted with collaborative  Who also advises ED.  [name] reports will drive her to [facility] ED.

## 2016-02-22 NOTE — ED Notes (Signed)
Patient transported to MRI 

## 2016-02-22 NOTE — ED Notes (Signed)
Report to given to [facility], RN

## 2016-02-22 NOTE — Consult Note (Signed)
Requesting Physician: Dr.  Thomasene Lot    Reason for consultation: Abnormal mass lesion in the brain MRI  HPI:                                                                                                                                         Kristin Hoffman is an 50 y.o. female patient who presented with metastatic breast cancer, ventricular thrombus, pancytopenia presents for slurred speech and weakness on the left side. Symptoms started about 5 days ago and have progressed. Initially only her speech was slurred now she is weak on the left side and having difficulty ambulating.   Past Medical History: Past Medical History  Diagnosis Date  . Depression   . Reflux   . Hx-TIA (transient ischemic attack) 11/22/2013  . Chronic headaches 11/22/2013  . Anxiety 11/22/2013  . Breast cancer ([place])   . Cancer ([place])   . Breast cancer metastasized to multiple sites [facility]) 12/24/2013    Past Surgical History  Procedure Laterality Date  . Mastectomy w/ nodes partial    . Mastectomy    . Breast reconstruction    . Portacath placement      x 2  . Portacath removal      x 2  . Abdominal hysterectomy    . Tee without cardioversion  11/12/2012    Procedure: TRANSESOPHAGEAL ECHOCARDIOGRAM (TEE);  Surgeon: Candee Furbish, MD;  Location: [facility] ENDOSCOPY;  Service: Cardiovascular;  Laterality: N/A;  This TEE may be Dr. Marlou Porch or a LHC Dr    Family History: Family History  Problem Relation Age of Onset  . Diabetes Mellitus II Neg Hx   . Hypertension Neg Hx     Social History:   reports that she has never smoked. She has never used smokeless tobacco. She reports that she drinks alcohol. She reports that she does not use illicit drugs.  Allergies:  Allergies  Allergen Reactions  . Afinitor [Everolimus] Hives and Itching     Medications:                                                                                                                         Current facility-administered medications:    .  dexamethasone (DECADRON) injection 10 mg, 10 mg, Intravenous, 4 times per day, Toshi Ishii Fuller Mandril, MD .  levETIRAcetam (KEPPRA) 1,000 mg in sodium chloride 0.9 %  100 mL IVPB, 1,000 mg, Intravenous, Q12H, Courteney Lyn Mackuen, MD, Last Rate: 440 mL/hr at 02/22/16 1254, 1,000 mg at 02/22/16 1254 .  mannitol 25 % injection 12.5 g, 12.5 g, Intravenous, Once, Myrlene Riera Fuller Mandril, MD  Current outpatient prescriptions:  .  acidophilus (RISAQUAD) CAPS capsule, Take 1 capsule by mouth every morning. , Disp: , Rfl:  .  Biotin 5000 MCG CAPS, Take 5,000 mcg by mouth every morning. , Disp: 30 capsule, Rfl:  .  cetirizine (ZYRTEC) 10 MG tablet, Take 10 mg by mouth at bedtime. , Disp: , Rfl:  .  Cholecalciferol 2000 UNITS CHEW, Chew 1 tablet by mouth daily with breakfast. , Disp: , Rfl:  .  diphenoxylate-atropine (LOMOTIL) 2.5-0.025 MG tablet, Take 1-2 tablets by mouth 4 (four) times daily as needed., Disp: , Rfl: 0 .  enoxaparin (LOVENOX) 100 MG/ML injection, Inject 1 mL (100 mg total) into the skin daily., Disp: 30 Syringe, Rfl: 2 .  escitalopram (LEXAPRO) 20 MG tablet, Take 1 tablet (20 mg total) by mouth daily., Disp: 30 tablet, Rfl: 12 .  LORazepam (ATIVAN) 0.5 MG tablet, Take one tablet at bedtime as needed for sleep (Patient taking differently: Take 0.5 mg by mouth at bedtime as needed for sleep. ), Disp: 120 tablet, Rfl: 1 .  Melatonin 10 MG TABS, Take 1 tablet by mouth at bedtime. Reported on 02/22/2016, Disp: , Rfl:  .  methadone (DOLOPHINE) 5 MG tablet, Take 1 tablet (5 mg total) by mouth every 8 (eight) hours. (Patient taking differently: Take 2.5 mg by mouth every 8 (eight) hours. ), Disp: 30 tablet, Rfl: 0 .  methylphenidate (RITALIN) 5 MG tablet, Take one tablet with breakfast and one tablet with lunch (Patient taking differently: Take 5 mg by mouth 2 (two) times daily with breakfast and lunch. Take one tablet with breakfast and one tablet with lunch), Disp: 60 tablet, Rfl: 0 .   montelukast (SINGULAIR) 10 MG tablet, Take 10 mg by mouth at bedtime. , Disp: , Rfl:  .  Multiple Vitamin (MULTIVITAMIN) tablet, Take 1 tablet by mouth every morning. , Disp: , Rfl:  .  nystatin (MYCOSTATIN) 100000 UNIT/ML suspension, Take 5 mLs (500,000 Units total) by mouth 4 (four) times daily. (Patient taking differently: Take 5 mLs by mouth daily. ), Disp: 240 mL, Rfl: 1 .  omeprazole (PRILOSEC) 40 MG capsule, Take 1 capsule (40 mg total) by mouth every morning., Disp: 30 capsule, Rfl: 4 .  ondansetron (ZOFRAN) 8 MG tablet, TAKE 1 TABLET BY MOUTH 2 TIMES DAILY. START DAY AFTER CHEMO FOR 2 DAYS. THEN TAKE AS NEEDED NAUSEA, Disp: 30 tablet, Rfl: 1 .  prochlorperazine (COMPAZINE) 10 MG tablet, Take 10 mg by mouth every 6 (six) hours as needed for nausea or vomiting. Reported on 02/16/2016, Disp: , Rfl:  .  traMADol (ULTRAM) 50 MG tablet, Take 1-2 tablets (50-100 mg total) by mouth every 6 (six) hours as needed. (Patient taking differently: Take 50-100 mg by mouth every 6 (six) hours as needed for moderate pain. ), Disp: 120 tablet, Rfl: 1 .  valACYclovir (VALTREX) 1000 MG tablet, Take 1 tablet (1,000 mg total) by mouth daily., Disp: 60 tablet, Rfl: 4   ROS:  History obtained from the patient  General ROS: negative for - chills, fatigue, fever, night sweats, weight gain or weight loss Psychological ROS: negative for - behavioral disorder, hallucinations, memory difficulties, mood swings or suicidal ideation Ophthalmic ROS: negative for - blurry vision, double vision, eye pain or loss of vision ENT ROS: negative for - epistaxis, nasal discharge, oral lesions, sore throat, tinnitus or vertigo Allergy and Immunology ROS: negative for - hives or itchy/watery eyes Hematological and Lymphatic ROS: negative for - bleeding problems, bruising or swollen lymph nodes Respiratory  ROS: negative for - cough, hemoptysis, shortness of breath or wheezing Cardiovascular ROS: negative for - chest pain, dyspnea on exertion, edema or irregular heartbeat Gastrointestinal ROS: negative for - abdominal pain, diarrhea, hematemesis, nausea/vomiting or stool incontinence Genito-Urinary ROS: negative for - dysuria, hematuria, incontinence or urinary frequency/urgency Musculoskeletal ROS: negative for - joint swelling Neurological ROS: as noted in HPI Dermatological ROS: negative for rash and skin lesion changes  Neurologic Examination:                                                                                                    Today's Vitals   02/22/16 1046 02/22/16 1207 02/22/16 1320 02/22/16 1513  BP:    147/110  Pulse:    95  Temp:      TempSrc:      Resp:    18  Height:      Weight:      SpO2: 93%   95%  PainSc: 7  9  8       Evaluation of higher integrative functions including: Level of alertness: Alert,  Oriented to time, place and person Speech: Moderate dysarthria, no aphasia noted.  Test the following cranial nerves: Conjugate Left gaze paresis, mild left facial UMN weakness, rest of the CN grossly intact Motor examination: 4/5 weakness in left upper and lower extremity, full strength on the right side Examination of sensation : mild left sensory extinction Test coordination: Normal finger nose testing, no abnormal involuntary movements or tremors noted.  Gait: Deferred   Lab Results: Basic Metabolic Panel:  Recent Labs Lab 02/16/16 1217 02/22/16 1155  NA 141 140  K 3.5 3.4*  CL  --  104  CO2 29 32  GLUCOSE 127 129*  BUN 8.5 12  CREATININE 0.7 0.38*  CALCIUM 8.2* 8.9    Liver Function Tests:  Recent Labs Lab 02/16/16 1217 02/22/16 1155  AST 24 39  ALT 12 27  ALKPHOS 105 78  BILITOT 1.07 1.1  PROT 4.8* 5.1*  ALBUMIN 2.7* 2.9*   No results for input(s): LIPASE, AMYLASE in the last 168 hours.  Recent Labs Lab 02/22/16 1155    AMMONIA 42*    CBC:  Recent Labs Lab 02/16/16 1217 02/22/16 1155  WBC 3.0* 2.0*  NEUTROABS 2.1 1.7  HGB 9.9* 10.6*  HCT 30.8* 32.7*  MCV 111.3* 114.3*  PLT 44* 24*    Cardiac Enzymes: No results for input(s): CKTOTAL, CKMB, CKMBINDEX, TROPONINI in the last 168 hours.  Lipid Panel: No results for input(s): CHOL, TRIG, HDL, CHOLHDL, VLDL, LDLCALC  in the last 168 hours.  CBG: No results for input(s): GLUCAP in the last 168 hours.  Microbiology: Results for orders placed or performed in visit on 06/08/15  TECHNOLOGIST REVIEW     Status: None   Collection Time: 06/08/15  1:15 PM  Result Value Ref Range Status   Technologist Review Few helmets  Final     Imaging: Dg Chest 2 View  02/22/2016  CLINICAL DATA:  Chest pain with shortness of breath for several days. History of metastatic breast carcinoma EXAM: CHEST  2 VIEW COMPARISON:  Chest radiograph June 05, 2015; chest CT February 01, 2016 FINDINGS: There is a pulmonary nodular opacity in the left upper lobe near the apex measuring 1.0 x 0.8 cm, essentially stable compared to prior CT examination. The previously noted right middle lobe lesion is not well seen on this current radiographic examination. It is vaguely appreciable on the lateral view where it measures 2.3 x 1.7 cm. There is no appreciable edema or consolidation. The heart size and pulmonary vascularity are within normal limits. There is equivocal hilar adenopathy on the right. Port-A-Cath tip is at the cavoatrial junction. No bone lesions are evident. There are surgical clips in the left breast and left axillary regions. IMPRESSION: Pulmonary nodular lesions in the left upper lobe and right middle lobe, also noted on prior CT. No frank edema or consolidation. No change in cardiac silhouette. There is questionable right hilar adenopathy currently. No pneumothorax. Postoperative change on left. Electronically Signed   By: Lowella Grip III M.D.   On: 02/22/2016 12:15    Ct Head Wo Contrast  02/22/2016  CLINICAL DATA:  Altered mental status with increased fatigue and somnolence. Breast carcinoma, currently receiving chemotherapy EXAM: CT HEAD WITHOUT CONTRAST TECHNIQUE: Contiguous axial images were obtained from the base of the skull through the vertex without intravenous contrast. COMPARISON:  September 28, 2015 FINDINGS: There is extensive vasogenic edema throughout much of the right frontal and temporal lobes as well as involving the anterior right occipital lobe. This extensive vasogenic edema extends throughout much of the right basal ganglia region. It effaces a portion of the right lateral ventricle with patient of the midline structures toward the left by 7 mm. Third ventricle is mildly effaced and shifted toward the left. The fourth ventricle is midline and normal in appearance. There is no hemorrhage. There are no subdural or epidural fluid collections. No acute infarct is evident. The bony calvarium appears intact. The mastoid air cells are clear. Visualized orbits appear symmetric bilaterally. IMPRESSION: Extensive vasogenic edema on the right causing effacement of the right lateral ventricle and to a lesser extent the third ventricle and midline shift of 7 mm toward the left. This finding most likely is due to underlying mass or masses on the right. No hemorrhage seen. No acute infarct evident. Advise brain MRI pre and post-contrast to further evaluate this area of presumed neoplastic involvement in the right side of the brain. Critical Value/emergent results were called by telephone at the time of interpretation on 02/22/2016 at 12:20 pm to Dr. Zenovia Jarred , who verbally acknowledged these results. Electronically Signed   By: Lowella Grip III M.D.   On: 02/22/2016 12:21   Mr Jeri Cos [id number] Contrast  02/22/2016  CLINICAL DATA:  Abnormal head CT with vasogenic edema on the right. Metastatic breast cancer. EXAM: MRI HEAD WITHOUT AND WITH CONTRAST TECHNIQUE:  Multiplanar, multiecho pulse sequences of the brain and surrounding structures were obtained without and with intravenous contrast.  CONTRAST:  68mL MULTIHANCE GADOBENATE DIMEGLUMINE 529 MG/ML IV SOLN COMPARISON:  Head CT earlier same day. MRI 08/20/2013. CT 09/28/2015 FINDINGS: No abnormality affects the brainstem or cerebellum. 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 In the right hemisphere, there is an intra-axial mass measuring 3 cm in diameter centered at the deep insula. There is extensive vasogenic edema emanating throughout the right temporal lobe an the right frontoparietal region. There is a second region of intra-axial enhancing mass in the posterior temporal lobe on the right with vasogenic edema. No other foci are identified. There are micro hemorrhagic foci within these tumors. The differential diagnosis in this case is that of multifocal glioblastoma versus metastatic disease. There is mass effect with maximal right-to-left shift measuring 1 cm. Threatening uncal herniation on the right. No evidence of ventricular trapping. No extra-axial fluid collection. IMPRESSION: Two enhancing masses within the right hemisphere, the larger centered in the deep insula measuring 3 cm in diameter with extensive surrounding vasogenic edema. The smaller is in the posterior temporal lobe measuring approximately 14 mm in size, also with surrounding edema. Mass effect with right-to-left shift of 1 cm. Micro hemorrhages present in both of these lesions. In a patient with a history of metastatic breast cancer, this could certainly represent that process. However, multifocal glioblastoma could also present with this appearance. The processes rapidly progressive, not diagnosable in October of 2016. I am in the process of calling this report to the clinical service. Electronically Signed   By: Nelson Chimes M.D.   On: 02/22/2016 14:53   Mr Card Morphology Wo/w Cm  02/21/2016   CLINICAL DATA:  50 year old female with breast cancer and a left ventricular mass seen on echocardiogram. EXAM: CARDIAC MRI TECHNIQUE: The patient was scanned on a 1.5 Tesla GE magnet. A dedicated cardiac coil was used. Functional imaging was done using Fiesta sequences. 2,3, and 4 chamber views were done to assess for RWMA's. Modified Simpson's rule using a short axis stack was used to calculate an ejection fraction on a dedicated work Conservation officer, nature. The patient received 24 cc of Multihance. After 10 minutes inversion recovery sequences were used to assess for infiltration and scar tissue. CONTRAST:  24 cc  of Multihance COMPARISON:  Echocardiogram performed on 02/08/2016 FINDINGS: 1. Mildly dilated left ventricle with normal thickness and mildly decreased systolic function (LVEF = 47%). There is hypokinesis in the apex, apical segments and mid anteroseptal walls. There is no late gadolinium enhancement in the left ventricular endocardium. LVEDD:  55 mm LVESD:  43 mm LVEDV:  140 ml LVESV:  74 ml SV:  66 ml CO:  6.5 L/minute 2.  Normal right ventricular size, thickness and systolic function. 3.  Normal biatrial size. 4.  Mild mitral and trivial tricuspid regurgitation. 5. There is a pedunculated, mobile mass attached is attached with a thin neck to the left ventricular apex that measures 13 x 9 mm. There is no perfusion or late gadolinium enhancement on regular and long TI images consistent with a thrombus. 6.  No pericardial effusion. IMPRESSION: 1. Mildly dilated left ventricle with normal thickness and mildly decreased systolic function (LVEF = 47%). There is hypokinesis in the apex, apical segments and mid anteroseptal walls. There is no late gadolinium enhancement in the left ventricular endocardium. 2.  Normal right ventricular size, thickness and systolic function. 3.  Mild mitral and trivial tricuspid regurgitation. 4. There is a pedunculated, mobile mass attached is attached with a thin  neck  to the left ventricular apex that measures 13 x 9 mm. There is no perfusion or late gadolinium enhancement on regular and long TI images consistent with a thrombus. When compared to the echocardiogram from 02/08/2016 the thrombus is smaller, previously measuring 16 x 13 mm. A continuation of systemic anticoagulation is recommended. Ena Dawley Electronically Signed   By: Ena Dawley   On: 02/21/2016 16:14      Assessment and plan:   Kristin Hoffman is an 50 y.o. female patient who presented with dysarthria, left-sided weakness symptoms, noted to have a large right parietal metastatic lesion with extensive vasogenic edema surrounding it and another smaller right temporal lesion. Reviewed MRI images with the patient and her husband.  Recommend IV Keppra 1 g twice a day for seizure prophylaxis given the seizure risk due to metastatic lesion and extensive edema, and Decadron 10 mg every 6 hours. One dose of mannitol 12.5 gm ordered in the ER. Neurosurgery was called from the ER and this was not believed to be a surgically resectable lesion .  Radiation oncology has been consulted, and planned for stereotactic radiosurgery. She'll be admitted to the hospitalist service. Defer further medical management to the hospitalist team and the oncology service.  Please call if any further questions

## 2016-02-22 NOTE — Progress Notes (Signed)
  Radiation Oncology         [phone] ________________________________  Name: Kristin Hoffman  MRN: [id number]  Date: 02/22/2016  DOB: [birth year]  Chart Note:  I reviewed this patient's most recent findings and wanted to take a minute to document my impression.  This patient is a very nice 50 year old woman with stage IV metastatic breast cancer. She presents with 2 isolated brain metastases. Her larger brain metastasis measures 3 cm occupying the deep insula of the right hemisphere. In addition, there is a second posterior right temporal brain metastasis. There reviewed this case with neurosurgery and there is no role for surgical resection.  The patient may be a good candidate for stereotactic radiosurgery to her 2 brain metastases. The larger lesion may require fractionated stereotactic radiation.  I will talk to her tomorrow morning to help coordinate treatment. Her planning could be initiated as inpatient as early as tomorrow, beginning stereotactic radiosurgery later this week.  ________________________________  Sheral Apley. Tammi Klippel, M.D.

## 2016-02-22 NOTE — Progress Notes (Signed)
Utilization Review completed.  Glenard Keesling RN CM  

## 2016-02-22 NOTE — ED Notes (Signed)
Patient is aware we need urine 

## 2016-02-22 NOTE — Consult Note (Signed)
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 female.  HPI: 50 year old female with known metastatic breast carcinoma presents with increasing left sided weakness and somnolence. Workup demonstrates evidence of 2 lesions to the right hemisphere consistent with metastatic disease with surrounding vasogenic edema. Patient placed on IV steroids and I have been consult advance to options for additional treatment area  Past Medical History  Diagnosis Date  . Depression   . Reflux   . Hx-TIA (transient ischemic attack) 11/22/2013  . Chronic headaches 11/22/2013  . Anxiety 11/22/2013  . Breast cancer ([place])   . Cancer ([place])   . Breast cancer metastasized to multiple sites [facility]) 12/24/2013    Past Surgical History  Procedure Laterality Date  . Mastectomy w/ nodes partial    . Mastectomy    . Breast reconstruction    . Portacath placement      x 2  . Portacath removal      x 2  . Abdominal hysterectomy    . Tee without cardioversion  11/12/2012    Procedure: TRANSESOPHAGEAL ECHOCARDIOGRAM (TEE);  Surgeon: Candee Furbish, MD;  Location: [facility] ENDOSCOPY;  Service: Cardiovascular;  Laterality: N/A;  This TEE may be Dr. Marlou Porch or a LHC Dr    Family History  Problem Relation Age of Onset  . Diabetes Mellitus II Neg Hx   . Hypertension Neg Hx     Social History:  reports that she has never smoked. She has never used smokeless tobacco. She reports that she drinks alcohol. She reports that she does not use illicit drugs.  Allergies:  Allergies  Allergen Reactions  . Afinitor [Everolimus] Hives and Itching    Medications:   Results for orders placed or performed during the hospital encounter of 02/22/16 (from the past 48 hour(s))  Comprehensive metabolic panel     Status: Abnormal   Collection Time: 02/22/16 11:55 AM  Result Value Ref Range   Sodium 140 135 - 145 mmol/L   Potassium 3.4 (L) 3.5 - 5.1 mmol/L   Chloride 104 101 - 111 mmol/L   CO2 32 22 - 32 mmol/L   Glucose, Bld 129 (H) 65 - 99 mg/dL   BUN 12 6 - 20 mg/dL   Creatinine, Ser 0.38 (L) 0.44 - 1.00 mg/dL   Calcium 8.9 8.9 - 10.3 mg/dL   Total Protein 5.1 (L) 6.5 - 8.1 g/dL   Albumin 2.9 (L) 3.5 - 5.0 g/dL   AST 39 15 - 41 U/L   ALT 27 14 - 54 U/L   Alkaline Phosphatase 78 38 - 126 U/L   Total Bilirubin 1.1 0.3 - 1.2 mg/dL   GFR calc non Af Amer >60 >60 mL/min   GFR calc Af Amer >60 >60 mL/min    Comment: (NOTE) The eGFR has been calculated using the CKD EPI equation. This calculation has not been validated in all clinical situations. eGFR's persistently <60 mL/min signify possible Chronic Kidney Disease.    Anion gap 4 (L) 5 - 15  CBC with Differential/Platelet     Status: Abnormal   Collection Time: 02/22/16 11:55 AM  Result Value Ref Range   WBC 2.0 (L) 4.0 - 10.5 K/uL   RBC 2.86 (L) 3.87 - 5.11 MIL/uL   Hemoglobin 10.6 (L) 12.0 - 15.0 g/dL   HCT 32.7 (L) 36.0 - 46.0 %   MCV 114.3 (H) 78.0 - 100.0 fL   MCH 37.1 (H) 26.0 - 34.0 pg  MCHC 32.4 30.0 - 36.0 g/dL   RDW 22.4 (H) 11.5 - 15.5 %   Platelets 24 (LL) 150 - 400 K/uL    Comment: SPECIMEN CHECKED FOR CLOTS REPEATED TO VERIFY PLATELET COUNT CONFIRMED BY SMEAR CRITICAL RESULT CALLED TO, READ BACK BY AND VERIFIED WITH: K. CAMPBELL RN AT 1245 ON 03.13.17 BY SHUEA    Neutrophils Relative % 83 %   Lymphocytes Relative 15 %   Monocytes Relative 2 %   Eosinophils Relative 0 %   Basophils Relative 0 %   Neutro Abs 1.7 1.7 - 7.7 K/uL   Lymphs Abs 0.3 (L) 0.7 - 4.0 K/uL   Monocytes Absolute 0.0 (L) 0.1 - 1.0 K/uL   Eosinophils Absolute 0.0 0.0 - 0.7 K/uL   Basophils Absolute 0.0 0.0 - 0.1 K/uL   Smear Review PLATELET COUNT CONFIRMED BY SMEAR   Ammonia     Status: Abnormal   Collection Time: 02/22/16 11:55 AM  Result Value Ref Range   Ammonia 42 (H) 9 - 35 umol/L  I-stat troponin, ED     Status: None   Collection Time: 02/22/16 12:06 PM  Result Value Ref Range    Troponin i, poc 0.00 0.00 - 0.08 ng/mL   Comment 3            Comment: Due to the release kinetics of cTnI, a negative result within the first hours of the onset of symptoms does not rule out myocardial infarction with certainty. If myocardial infarction is still suspected, repeat the test at appropriate intervals.   I-Stat CG4 Lactic Acid, ED     Status: None   Collection Time: 02/22/16 12:08 PM  Result Value Ref Range   Lactic Acid, Venous 0.97 0.5 - 2.0 mmol/L  Urinalysis, Routine w reflex microscopic (not at [facility])     Status: Abnormal   Collection Time: 02/22/16  3:13 PM  Result Value Ref Range   Color, Urine AMBER (A) YELLOW    Comment: BIOCHEMICALS MAY BE AFFECTED BY COLOR   APPearance CLOUDY (A) CLEAR   Specific Gravity, Urine 1.017 1.005 - 1.030   pH 7.5 5.0 - 8.0   Glucose, UA NEGATIVE NEGATIVE mg/dL   Hgb urine dipstick NEGATIVE NEGATIVE   Bilirubin Urine NEGATIVE NEGATIVE   Ketones, ur NEGATIVE NEGATIVE mg/dL   Protein, ur NEGATIVE NEGATIVE mg/dL   Nitrite NEGATIVE NEGATIVE   Leukocytes, UA TRACE (A) NEGATIVE  Urine microscopic-add on     Status: Abnormal   Collection Time: 02/22/16  3:13 PM  Result Value Ref Range   Squamous Epithelial / LPF 0-5 (A) NONE SEEN   WBC, UA 0-5 0 - 5 WBC/hpf   RBC / HPF 0-5 0 - 5 RBC/hpf   Bacteria, UA FEW (A) NONE SEEN   Urine-Other MUCOUS PRESENT     Comment: AMORPHOUS URATES/PHOSPHATES  Osmolality     Status: None   Collection Time: 02/22/16  4:13 PM  Result Value Ref Range   Osmolality 290 275 - 295 mOsm/kg    Comment: Performed at [facility]  Type and screen [place]     Status: None   Collection Time: 02/22/16  6:55 PM  Result Value Ref Range   ABO/RH(D) A POS    Antibody Screen NEG    Sample Expiration 02/25/2016     Dg Chest 2 View  02/22/2016  CLINICAL DATA:  Chest pain with shortness of breath for several days. History of metastatic breast carcinoma EXAM: CHEST  2 VIEW  COMPARISON:   Chest radiograph June 05, 2015; chest CT February 01, 2016 FINDINGS: There is a pulmonary nodular opacity in the left upper lobe near the apex measuring 1.0 x 0.8 cm, essentially stable compared to prior CT examination. The previously noted right middle lobe lesion is not well seen on this current radiographic examination. It is vaguely appreciable on the lateral view where it measures 2.3 x 1.7 cm. There is no appreciable edema or consolidation. The heart size and pulmonary vascularity are within normal limits. There is equivocal hilar adenopathy on the right. Port-A-Cath tip is at the cavoatrial junction. No bone lesions are evident. There are surgical clips in the left breast and left axillary regions. IMPRESSION: Pulmonary nodular lesions in the left upper lobe and right middle lobe, also noted on prior CT. No frank edema or consolidation. No change in cardiac silhouette. There is questionable right hilar adenopathy currently. No pneumothorax. Postoperative change on left. Electronically Signed   By: Lowella Grip III M.D.   On: 02/22/2016 12:15   Ct Head Wo Contrast  02/22/2016  CLINICAL DATA:  Altered mental status with increased fatigue and somnolence. Breast carcinoma, currently receiving chemotherapy EXAM: CT HEAD WITHOUT CONTRAST TECHNIQUE: Contiguous axial images were obtained from the base of the skull through the vertex without intravenous contrast. COMPARISON:  September 28, 2015 FINDINGS: There is extensive vasogenic edema throughout much of the right frontal and temporal lobes as well as involving the anterior right occipital lobe. This extensive vasogenic edema extends throughout much of the right basal ganglia region. It effaces a portion of the right lateral ventricle with patient of the midline structures toward the left by 7 mm. Third ventricle is mildly effaced and shifted toward the left. The fourth ventricle is midline and normal in appearance. There is no hemorrhage. There are no  subdural or epidural fluid collections. No acute infarct is evident. The bony calvarium appears intact. The mastoid air cells are clear. Visualized orbits appear symmetric bilaterally. IMPRESSION: Extensive vasogenic edema on the right causing effacement of the right lateral ventricle and to a lesser extent the third ventricle and midline shift of 7 mm toward the left. This finding most likely is due to underlying mass or masses on the right. No hemorrhage seen. No acute infarct evident. Advise brain MRI pre and post-contrast to further evaluate this area of presumed neoplastic involvement in the right side of the brain. Critical Value/emergent results were called by telephone at the time of interpretation on 02/22/2016 at 12:20 pm to Dr. Zenovia Jarred , who verbally acknowledged these results. Electronically Signed   By: Lowella Grip III M.D.   On: 02/22/2016 12:21   Mr Jeri Cos DJ Contrast  02/22/2016  CLINICAL DATA:  Abnormal head CT with vasogenic edema on the right. Metastatic breast cancer. EXAM: MRI HEAD WITHOUT AND WITH CONTRAST TECHNIQUE: Multiplanar, multiecho pulse sequences of the brain and surrounding structures were obtained without and with intravenous contrast. CONTRAST:  17m MULTIHANCE GADOBENATE DIMEGLUMINE 529 MG/ML IV SOLN COMPARISON:  Head CT earlier same day. MRI 08/20/2013. CT 09/28/2015 FINDINGS: No abnormality affects the brainstem or cerebellum. 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 In the right hemisphere, there is an intra-axial mass measuring 3 cm in diameter centered at the deep insula. There is extensive vasogenic edema emanating throughout the right temporal lobe an the right frontoparietal region. There is a second region of intra-axial enhancing mass in the posterior temporal lobe on the right  with vasogenic edema. No other foci are identified. There are micro hemorrhagic foci within these tumors. The differential diagnosis  in this case is that of multifocal glioblastoma versus metastatic disease. There is mass effect with maximal right-to-left shift measuring 1 cm. Threatening uncal herniation on the right. No evidence of ventricular trapping. No extra-axial fluid collection. IMPRESSION: Two enhancing masses within the right hemisphere, the larger centered in the deep insula measuring 3 cm in diameter with extensive surrounding vasogenic edema. The smaller is in the posterior temporal lobe measuring approximately 14 mm in size, also with surrounding edema. Mass effect with right-to-left shift of 1 cm. Micro hemorrhages present in both of these lesions. In a patient with a history of metastatic breast cancer, this could certainly represent that process. However, multifocal glioblastoma could also present with this appearance. The processes rapidly progressive, not diagnosable in October of 2016. I am in the process of calling this report to the clinical service. Electronically Signed   By: Nelson Chimes M.D.   On: 02/22/2016 14:53     Blood pressure 131/83, pulse 88, temperature 98.4 F (36.9 C), temperature source Oral, resp. rate 16, height 5' 3"  (1.6 m), weight 73.936 kg (163 lb), SpO2 100 %. Patient is awake and alert. She is pleasant and cooperative. Examination finds her speech to be fluent. Her judgment and insight are intact. Her cranial nerve function demonstrates significant left facial weakness. Tongue protrudes to the left. Extraocular movements are full. Pupils are equal round reactive at 4 mm. Motor examination the extremities reveals 4 over 5 spastic weakness of her left upper and lower extremities. Reflexes are increased on the left with upgoing toes on the left plantar stimulation.  Assessment/Plan:  Metastatic breast carcinoma with deep right insular lesion extending down to the level of the internal capsule. This is 3 cm in diameter with significant surrounding vasogenic edema. There is a smaller deep  parietal occipital lesion which is approximately 1 cm in diameter. There is no other obvious disease. Tumors are most consistent with metastatic disease and chances of this being some other type of lesion is minimal. I believe it is appropriate to treat these lesions without additional tissue diagnosis. I do not believe that surgical resection is in the patient's best interest secondary to both her medical condition and the very high morbidity that surgery would entail giving the deep location of these tumors. Therefore I believe that she is best treated with stereotactic radiosurgery possibly and fractionated doses. Dr. Tammi Klippel of the radiation oncology service will be evaluating her and working toward developing a plan for treatment as well. Hopefully we can get this done within the next few days. Continue IV steroids.  Ogechi Kuehnel A 02/22/2016, 9:25 PM

## 2016-02-22 NOTE — H&P (Signed)
[facility] History and Physical  Kristin Hoffman [id number] DOB: 05-22-1966 DOA: 02/22/2016   PCP: Drake Leach, MD    Chief Complaint: Slurred speech weakness of the left side  HPI: Kristin Hoffman is a 50 y.o. female with metastatic breast cancer, ventricular thrombus, pancytopenia presents for slurred speech and weakness on the left side. Symptoms started about 5 days ago and haven't progressed. Initially only her speech was slurred now she is weak on the left side and having difficulty ambulating. Imaging in the ER reveals brain metastases. No complaint of numbness tingling, visual changes or slurred speech.    General: +aorexia, +weight loss Cardiac: Denies, syncope, palpitations, pedal edema + chest pain on inspiration Respiratory: Denies cough, shortness of breath, wheezing GI: Denies severe indigestion/heartburn, abdominal pain, nausea, diarrhea and constipation + 1 episode of vomiting yesterday GU: Denies hematuria, incontinence, dysuria  Musculoskeletal: Denies arthritis  Skin: Denies suspicious skin lesions Neurologic: Per history of present illness Psychiatry: Denies depression or anxiety. Hematologic: no easy bruising or bleeding  All other systems reviewed and found to be negative.  Past Medical History  Diagnosis Date  . Depression   . Reflux   . Hx-TIA (transient ischemic attack) 11/22/2013  . Chronic headaches 11/22/2013  . Anxiety 11/22/2013  . Breast cancer ([place])   . Cancer ([place])   . Breast cancer metastasized to multiple sites [facility]) 12/24/2013    Past Surgical History  Procedure Laterality Date  . Mastectomy w/ nodes partial    . Mastectomy    . Breast reconstruction    . Portacath placement      x 2  . Portacath removal      x 2  . Abdominal hysterectomy    . Tee without cardioversion  11/12/2012    Procedure: TRANSESOPHAGEAL ECHOCARDIOGRAM (TEE);  Surgeon: Candee Furbish, MD;  Location: [facility] ENDOSCOPY;  Service: Cardiovascular;  Laterality: N/A;   This TEE may be Dr. Marlou Porch or a [facility] Dr    Social History:  Social History   Social History  . Marital Status: Married    Spouse Name: N/A  . Number of Children: N/A  . Years of Education: N/A   Occupational History  . Not on file.   Social History Main Topics  . Smoking status: Never Smoker   . Smokeless tobacco: Never Used  . Alcohol Use: Yes     Comment: rarely  . Drug Use: No  . Sexual Activity: Yes    Birth Control/ Protection: Surgical   Other Topics Concern  . Not on file   Social History Narrative    Lives at home with husband   Allergies  Allergen Reactions  . Afinitor [Everolimus] Hives and Itching    Family history:   Family History  Problem Relation Age of Onset  . Diabetes Mellitus II Neg Hx   . Hypertension Neg Hx       Prior to Admission medications   Medication Sig Start Date End Date Taking? Authorizing Provider  acidophilus (RISAQUAD) CAPS capsule Take 1 capsule by mouth every morning.    Yes Historical Provider, MD  Biotin 5000 MCG CAPS Take 5,000 mcg by mouth every morning.  02/27/15  Yes Chauncey Cruel, MD  cetirizine (ZYRTEC) 10 MG tablet Take 10 mg by mouth at bedtime.    Yes Historical Provider, MD  Cholecalciferol 2000 UNITS CHEW Chew 1 tablet by mouth daily with breakfast.    Yes Historical Provider, MD  diphenoxylate-atropine (LOMOTIL) 2.5-0.025 MG tablet Take 1-2 tablets  by mouth 4 (four) times daily as needed. 12/17/15  Yes Historical Provider, MD  enoxaparin (LOVENOX) 100 MG/ML injection Inject 1 mL (100 mg total) into the skin daily. 02/09/16  Yes Chauncey Cruel, MD  escitalopram (LEXAPRO) 20 MG tablet Take 1 tablet (20 mg total) by mouth daily. 06/16/15  Yes Chauncey Cruel, MD  LORazepam (ATIVAN) 0.5 MG tablet Take one tablet at bedtime as needed for sleep Patient taking differently: Take 0.5 mg by mouth at bedtime as needed for sleep.  01/05/16  Yes Chauncey Cruel, MD  Melatonin 10 MG TABS Take 1 tablet by mouth at  bedtime. Reported on 02/22/2016   Yes Historical Provider, MD  methadone (DOLOPHINE) 5 MG tablet Take 1 tablet (5 mg total) by mouth every 8 (eight) hours. Patient taking differently: Take 2.5 mg by mouth every 8 (eight) hours.  02/03/16  Yes Chauncey Cruel, MD  methylphenidate (RITALIN) 5 MG tablet Take one tablet with breakfast and one tablet with lunch Patient taking differently: Take 5 mg by mouth 2 (two) times daily with breakfast and lunch. Take one tablet with breakfast and one tablet with lunch 02/02/16  Yes Chauncey Cruel, MD  montelukast (SINGULAIR) 10 MG tablet Take 10 mg by mouth at bedtime.    Yes Historical Provider, MD  Multiple Vitamin (MULTIVITAMIN) tablet Take 1 tablet by mouth every morning.    Yes Historical Provider, MD  nystatin (MYCOSTATIN) 100000 UNIT/ML suspension Take 5 mLs (500,000 Units total) by mouth 4 (four) times daily. Patient taking differently: Take 5 mLs by mouth daily.  04/10/15  Yes Chauncey Cruel, MD  omeprazole (PRILOSEC) 40 MG capsule Take 1 capsule (40 mg total) by mouth every morning. 02/03/16  Yes Chauncey Cruel, MD  ondansetron (ZOFRAN) 8 MG tablet TAKE 1 TABLET BY MOUTH 2 TIMES DAILY. START DAY AFTER CHEMO FOR 2 DAYS. THEN TAKE AS NEEDED NAUSEA 09/15/15  Yes Chauncey Cruel, MD  prochlorperazine (COMPAZINE) 10 MG tablet Take 10 mg by mouth every 6 (six) hours as needed for nausea or vomiting. Reported on 02/16/2016 12/13/14  Yes Historical Provider, MD  traMADol (ULTRAM) 50 MG tablet Take 1-2 tablets (50-100 mg total) by mouth every 6 (six) hours as needed. Patient taking differently: Take 50-100 mg by mouth every 6 (six) hours as needed for moderate pain.  02/02/16  Yes Chauncey Cruel, MD  valACYclovir (VALTREX) 1000 MG tablet Take 1 tablet (1,000 mg total) by mouth daily. 11/24/15  Yes Chauncey Cruel, MD     Physical Exam: Filed Vitals:   02/22/16 1043 02/22/16 1046 02/22/16 1513 02/22/16 1700  BP: 134/99  147/110 137/88  Pulse: 100  95  92  Temp: 98.4 F (36.9 C)   98.3 F (36.8 C)  TempSrc: Oral   Oral  Resp: 19  18 18   Height: 5\' 3"  (1.6 m)     Weight: 73.936 kg (163 lb)     SpO2: 95% 93% 95% 94%     General: AAO x3, no acute distress HEENT: Normocephalic and Atraumatic, Mucous membranes pink                PERRLA; pupils dilated EOM intact; No scleral icterus,                 Nares: Patent, Oropharynx: Clear, Fair Dentition                 Neck: FROM, no cervical lymphadenopathy, thyromegaly, carotid bruit or JVD;  Breasts: deferred CHEST WALL: No tenderness  CHEST: Normal respiration, clear to auscultation bilaterally  HEART: Regular rate and rhythm; no murmurs rubs or gallops  BACK: No kyphosis or scoliosis; no CVA tenderness  GI: Positive Bowel Sounds, soft, non-tender; no masses, no organomegaly Rectal Exam: deferred MSK: No cyanosis, clubbing, or edema Genitalia: not examined  SKIN:  no rash or ulceration  CNS: Alert and Oriented x 4,  Left facial droop -other cranial nerves intact. Left arm and leg  4/5 - right arm leg 5/5  Labs on Admission:  Basic Metabolic Panel:  Recent Labs Lab 02/16/16 1217 02/22/16 1155  NA 141 140  K 3.5 3.4*  CL  --  104  CO2 29 32  GLUCOSE 127 129*  BUN 8.5 12  CREATININE 0.7 0.38*  CALCIUM 8.2* 8.9   Liver Function Tests:  Recent Labs Lab 02/16/16 1217 02/22/16 1155  AST 24 39  ALT 12 27  ALKPHOS 105 78  BILITOT 1.07 1.1  PROT 4.8* 5.1*  ALBUMIN 2.7* 2.9*   No results for input(s): LIPASE, AMYLASE in the last 168 hours.  Recent Labs Lab 02/22/16 1155  AMMONIA 42*   CBC:  Recent Labs Lab 02/16/16 1217 02/22/16 1155  WBC 3.0* 2.0*  NEUTROABS 2.1 1.7  HGB 9.9* 10.6*  HCT 30.8* 32.7*  MCV 111.3* 114.3*  PLT 44* 24*   Cardiac Enzymes: No results for input(s): CKTOTAL, CKMB, CKMBINDEX, TROPONINI in the last 168 hours.  BNP (last 3 results)  Recent Labs  05/29/15 1521  BNP 29.0    ProBNP (last 3 results) No results for input(s):  PROBNP in the last 8760 hours.  CBG: No results for input(s): GLUCAP in the last 168 hours.  Radiological Exams on Admission: Dg Chest 2 View  02/22/2016  CLINICAL DATA:  Chest pain with shortness of breath for several days. History of metastatic breast carcinoma EXAM: CHEST  2 VIEW COMPARISON:  Chest radiograph June 05, 2015; chest CT February 01, 2016 FINDINGS: There is a pulmonary nodular opacity in the left upper lobe near the apex measuring 1.0 x 0.8 cm, essentially stable compared to prior CT examination. The previously noted right middle lobe lesion is not well seen on this current radiographic examination. It is vaguely appreciable on the lateral view where it measures 2.3 x 1.7 cm. There is no appreciable edema or consolidation. The heart size and pulmonary vascularity are within normal limits. There is equivocal hilar adenopathy on the right. Port-A-Cath tip is at the cavoatrial junction. No bone lesions are evident. There are surgical clips in the left breast and left axillary regions. IMPRESSION: Pulmonary nodular lesions in the left upper lobe and right middle lobe, also noted on prior CT. No frank edema or consolidation. No change in cardiac silhouette. There is questionable right hilar adenopathy currently. No pneumothorax. Postoperative change on left. Electronically Signed   By: Lowella Grip III M.D.   On: 02/22/2016 12:15   Ct Head Wo Contrast  02/22/2016  CLINICAL DATA:  Altered mental status with increased fatigue and somnolence. Breast carcinoma, currently receiving chemotherapy EXAM: CT HEAD WITHOUT CONTRAST TECHNIQUE: Contiguous axial images were obtained from the base of the skull through the vertex without intravenous contrast. COMPARISON:  September 28, 2015 FINDINGS: There is extensive vasogenic edema throughout much of the right frontal and temporal lobes as well as involving the anterior right occipital lobe. This extensive vasogenic edema extends throughout much of the  right basal ganglia region. It effaces a portion of  the right lateral ventricle with patient of the midline structures toward the left by 7 mm. Third ventricle is mildly effaced and shifted toward the left. The fourth ventricle is midline and normal in appearance. There is no hemorrhage. There are no subdural or epidural fluid collections. No acute infarct is evident. The bony calvarium appears intact. The mastoid air cells are clear. Visualized orbits appear symmetric bilaterally. IMPRESSION: Extensive vasogenic edema on the right causing effacement of the right lateral ventricle and to a lesser extent the third ventricle and midline shift of 7 mm toward the left. This finding most likely is due to underlying mass or masses on the right. No hemorrhage seen. No acute infarct evident. Advise brain MRI pre and post-contrast to further evaluate this area of presumed neoplastic involvement in the right side of the brain. Critical Value/emergent results were called by telephone at the time of interpretation on 02/22/2016 at 12:20 pm to Dr. Zenovia Jarred , who verbally acknowledged these results. Electronically Signed   By: Lowella Grip III M.D.   On: 02/22/2016 12:21   Mr Jeri Cos [id number] Contrast  02/22/2016  CLINICAL DATA:  Abnormal head CT with vasogenic edema on the right. Metastatic breast cancer. EXAM: MRI HEAD WITHOUT AND WITH CONTRAST TECHNIQUE: Multiplanar, multiecho pulse sequences of the brain and surrounding structures were obtained without and with intravenous contrast. CONTRAST:  65mL MULTIHANCE GADOBENATE DIMEGLUMINE 529 MG/ML IV SOLN COMPARISON:  Head CT earlier same day. MRI 08/20/2013. CT 09/28/2015 FINDINGS: No abnormality affects the brainstem or cerebellum. 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 In the right hemisphere, there is an intra-axial mass measuring 3 cm in diameter centered at the deep insula. There is extensive vasogenic edema emanating  throughout the right temporal lobe an the right frontoparietal region. There is a second region of intra-axial enhancing mass in the posterior temporal lobe on the right with vasogenic edema. No other foci are identified. There are micro hemorrhagic foci within these tumors. The differential diagnosis in this case is that of multifocal glioblastoma versus metastatic disease. There is mass effect with maximal right-to-left shift measuring 1 cm. Threatening uncal herniation on the right. No evidence of ventricular trapping. No extra-axial fluid collection. IMPRESSION: Two enhancing masses within the right hemisphere, the larger centered in the deep insula measuring 3 cm in diameter with extensive surrounding vasogenic edema. The smaller is in the posterior temporal lobe measuring approximately 14 mm in size, also with surrounding edema. Mass effect with right-to-left shift of 1 cm. Micro hemorrhages present in both of these lesions. In a patient with a history of metastatic breast cancer, this could certainly represent that process. However, multifocal glioblastoma could also present with this appearance. The processes rapidly progressive, not diagnosable in October of 2016. I am in the process of calling this report to the clinical service. Electronically Signed   By: Nelson Chimes M.D.   On: 02/22/2016 14:53    EKG: Independently reviewed. EKG being done  Assessment/Plan Principal Problem:   Brain metastasis  -We'll consult radiation oncology-  neurosurgery (Dr Trenton Gammon) states that no surgery is indicated - I Spoke with Dr Isidore Moos who states that Dr Trenton Gammon has already contacted Dr Tammi Klippel for the consult -started on IV Decadron- -Neuro checks every 4 hours due to impending herniation- if any changes noted, will need to transfer to SDU or ICU - situation discussed with patient in detail and presence of her husband-she states she wants to be a  full code and if afterwards she is not able to be functional without  life support, she would like her family to remove her from life support  Active Problems:   Breast cancer metastasized to multiple sites  -Outpatient management has been by Dr Jana Hakim- ER doctor has consulted on-call oncology (Dr Marin Olp) - cont home pain regimen  Nausea/ Vomiting - PRN Zofran    Thrombocytopenia  -Platelets 24 in setting of full dose Lovenox-we'll give a platelet infusion and follow-up platelets tomorrow-I have explained to the patient and the family the high risk of bleeding    Ventricular mural thrombus - Dr Jana Hakim last recommended to continue Lovenox despite her low platelets as the benefit outweighed the risk -Continue Lovenox as recommended a cardiology after MRI performed a few days ago    Consulted: Radiation oncology and medical oncology  Code Status: Full code Family Communication: husband   DVT Prophylaxis:Lovenox  Time spent: 47 min  [place], MD [facility]  If 7PM-7AM, please contact night-coverage [email or web address] 02/22/2016, 6:16 PM

## 2016-02-22 NOTE — ED Provider Notes (Signed)
CSN: [id number]     Arrival date & time 02/22/16  1027 History   First MD Initiated Contact with Patient 02/22/16 1054     Chief Complaint  Patient presents with  . Aphasia     (Consider location/radiation/quality/duration/timing/severity/associated sxs/prior Treatment) HPI   Patient is a 50 year old female with breast cancer actively undergoing chemotherapy. Patient has had the last 4-5 days with increasing fatigue, somnolence.  Patient started a new chemotherapy on Tuesday. Husband is knows that since then she's been increasingly fatigued. No focality to her symptoms. No fevers. No urinary symptoms no nausea no vomiting no focal neuro deficits. Husband brought her here for evaluation to make sure there is nothing concerning.    Past Medical History  Diagnosis Date  . Depression   . Reflux   . Hx-TIA (transient ischemic attack) 11/22/2013  . Chronic headaches 11/22/2013  . Anxiety 11/22/2013  . Breast cancer ([place])   . Cancer ([place])   . Breast cancer metastasized to multiple sites [facility]) 12/24/2013   Past Surgical History  Procedure Laterality Date  . Mastectomy w/ nodes partial    . Mastectomy    . Breast reconstruction    . Portacath placement      x 2  . Portacath removal      x 2  . Abdominal hysterectomy    . Tee without cardioversion  11/12/2012    Procedure: TRANSESOPHAGEAL ECHOCARDIOGRAM (TEE);  Surgeon: Candee Furbish, MD;  Location: [facility] ENDOSCOPY;  Service: Cardiovascular;  Laterality: N/A;  This TEE may be Dr. Marlou Porch or a [facility] Dr   Family History  Problem Relation Age of Onset  . Diabetes Mellitus II Neg Hx   . Hypertension Neg Hx    Social History  Substance Use Topics  . Smoking status: Never Smoker   . Smokeless tobacco: Never Used  . Alcohol Use: Yes     Comment: rarely   OB History    No data available     Review of Systems  Constitutional: Negative for fever, activity change and fatigue.  HENT: Negative for congestion.   Eyes: Negative for discharge.   Respiratory: Negative for cough and chest tightness.   Cardiovascular: Negative for chest pain.  Gastrointestinal: Negative for abdominal distention.  Genitourinary: Negative for dysuria.  Musculoskeletal: Negative for joint swelling.  Skin: Negative for rash.  Allergic/Immunologic: Negative for immunocompromised state.  Neurological: Positive for weakness. Negative for dizziness, tremors, seizures, facial asymmetry, speech difficulty, light-headedness and headaches.  Psychiatric/Behavioral: Negative for agitation.      Allergies  [employer]  Home Medications   Prior to Admission medications   Medication Sig Start Date End Date Taking? Authorizing Provider  acidophilus (RISAQUAD) CAPS capsule Take 1 capsule by mouth every morning.    Yes Historical Provider, MD  Biotin 5000 MCG CAPS Take 5,000 mcg by mouth every morning.  02/27/15  Yes Chauncey Cruel, MD  cetirizine (ZYRTEC) 10 MG tablet Take 10 mg by mouth at bedtime.    Yes Historical Provider, MD  Cholecalciferol 2000 UNITS CHEW Chew 1 tablet by mouth daily with breakfast.    Yes Historical Provider, MD  diphenoxylate-atropine (LOMOTIL) 2.5-0.025 MG tablet Take 1-2 tablets by mouth 4 (four) times daily as needed. 12/17/15  Yes Historical Provider, MD  enoxaparin (LOVENOX) 100 MG/ML injection Inject 1 mL (100 mg total) into the skin daily. 02/09/16  Yes Chauncey Cruel, MD  escitalopram (LEXAPRO) 20 MG tablet Take 1 tablet (20 mg total) by mouth daily. 06/16/15  Yes Sarajane Jews  C Magrinat, MD  LORazepam (ATIVAN) 0.5 MG tablet Take one tablet at bedtime as needed for sleep Patient taking differently: Take 0.5 mg by mouth at bedtime as needed for sleep.  01/05/16  Yes Chauncey Cruel, MD  Melatonin 10 MG TABS Take 1 tablet by mouth at bedtime. Reported on 02/22/2016   Yes Historical Provider, MD  methadone (DOLOPHINE) 5 MG tablet Take 1 tablet (5 mg total) by mouth every 8 (eight) hours. Patient taking differently: Take 2.5 mg by mouth every  8 (eight) hours.  02/03/16  Yes Chauncey Cruel, MD  methylphenidate (RITALIN) 5 MG tablet Take one tablet with breakfast and one tablet with lunch Patient taking differently: Take 5 mg by mouth 2 (two) times daily with breakfast and lunch. Take one tablet with breakfast and one tablet with lunch 02/02/16  Yes Chauncey Cruel, MD  montelukast (SINGULAIR) 10 MG tablet Take 10 mg by mouth at bedtime.    Yes Historical Provider, MD  Multiple Vitamin (MULTIVITAMIN) tablet Take 1 tablet by mouth every morning.    Yes Historical Provider, MD  nystatin (MYCOSTATIN) 100000 UNIT/ML suspension Take 5 mLs (500,000 Units total) by mouth 4 (four) times daily. Patient taking differently: Take 5 mLs by mouth daily.  04/10/15  Yes Chauncey Cruel, MD  omeprazole (PRILOSEC) 40 MG capsule Take 1 capsule (40 mg total) by mouth every morning. 02/03/16  Yes Chauncey Cruel, MD  ondansetron (ZOFRAN) 8 MG tablet TAKE 1 TABLET BY MOUTH 2 TIMES DAILY. START DAY AFTER CHEMO FOR 2 DAYS. THEN TAKE AS NEEDED NAUSEA 09/15/15  Yes Chauncey Cruel, MD  prochlorperazine (COMPAZINE) 10 MG tablet Take 10 mg by mouth every 6 (six) hours as needed for nausea or vomiting. Reported on 02/16/2016 12/13/14  Yes Historical Provider, MD  traMADol (ULTRAM) 50 MG tablet Take 1-2 tablets (50-100 mg total) by mouth every 6 (six) hours as needed. Patient taking differently: Take 50-100 mg by mouth every 6 (six) hours as needed for moderate pain.  02/02/16  Yes Chauncey Cruel, MD  valACYclovir (VALTREX) 1000 MG tablet Take 1 tablet (1,000 mg total) by mouth daily. 11/24/15  Yes Virgie Dad Magrinat, MD   BP 147/110 mmHg  Pulse 95  Temp(Src) 98.4 F (36.9 C) (Oral)  Resp 18  Ht 5' 3"  (1.6 m)  Wt 163 lb (73.936 kg)  BMI 28.88 kg/m2  SpO2 95% Physical Exam  Constitutional: She is oriented to person, place, and time. She appears well-developed and well-nourished.  Patient mildly ill-appearing 50 year old female.  HENT:  Head: Normocephalic  and atraumatic.  Pupils large.  Eyes: Conjunctivae are normal. Right eye exhibits no discharge.  Neck: Neck supple.  Cardiovascular: Normal rate, regular rhythm and normal heart sounds.   No murmur heard. Pulmonary/Chest: Effort normal and breath sounds normal. She has no wheezes. She has no rales.  Abdominal: Soft. She exhibits no distension. There is no tenderness.  Musculoskeletal: Normal range of motion. She exhibits no edema.  Neurological: She is oriented to person, place, and time. No cranial nerve deficit.  Skin: Skin is warm and dry. No rash noted. She is not diaphoretic.  Pale.  Psychiatric: She has a normal mood and affect. Her behavior is normal.  Nursing note and vitals reviewed.   ED Course  Procedures (including critical care time) Labs Review Labs Reviewed  COMPREHENSIVE METABOLIC PANEL - Abnormal; Notable for the following:    Potassium 3.4 (*)    Glucose, Bld 129 (*)  Creatinine, Ser 0.38 (*)    Total Protein 5.1 (*)    Albumin 2.9 (*)    Anion gap 4 (*)    All other components within normal limits  CBC WITH DIFFERENTIAL/PLATELET - Abnormal; Notable for the following:    WBC 2.0 (*)    RBC 2.86 (*)    Hemoglobin 10.6 (*)    HCT 32.7 (*)    MCV 114.3 (*)    MCH 37.1 (*)    RDW 22.4 (*)    Platelets 24 (*)    Lymphs Abs 0.3 (*)    Monocytes Absolute 0.0 (*)    All other components within normal limits  AMMONIA - Abnormal; Notable for the following:    Ammonia 42 (*)    All other components within normal limits  URINALYSIS, ROUTINE W REFLEX MICROSCOPIC (NOT AT [facility]) - Abnormal; Notable for the following:    Color, Urine AMBER (*)    APPearance CLOUDY (*)    Leukocytes, UA TRACE (*)    All other components within normal limits  URINE MICROSCOPIC-ADD ON - Abnormal; Notable for the following:    Squamous Epithelial / LPF 0-5 (*)    Bacteria, UA FEW (*)    All other components within normal limits  URINE CULTURE  OSMOLALITY  I-STAT TROPOININ, ED   I-STAT CG4 LACTIC ACID, ED    Imaging Review Dg Chest 2 View  02/22/2016  CLINICAL DATA:  Chest pain with shortness of breath for several days. History of metastatic breast carcinoma EXAM: CHEST  2 VIEW COMPARISON:  Chest radiograph June 05, 2015; chest CT February 01, 2016 FINDINGS: There is a pulmonary nodular opacity in the left upper lobe near the apex measuring 1.0 x 0.8 cm, essentially stable compared to prior CT examination. The previously noted right middle lobe lesion is not well seen on this current radiographic examination. It is vaguely appreciable on the lateral view where it measures 2.3 x 1.7 cm. There is no appreciable edema or consolidation. The heart size and pulmonary vascularity are within normal limits. There is equivocal hilar adenopathy on the right. Port-A-Cath tip is at the cavoatrial junction. No bone lesions are evident. There are surgical clips in the left breast and left axillary regions. IMPRESSION: Pulmonary nodular lesions in the left upper lobe and right middle lobe, also noted on prior CT. No frank edema or consolidation. No change in cardiac silhouette. There is questionable right hilar adenopathy currently. No pneumothorax. Postoperative change on left. Electronically Signed   By: Lowella Grip III M.D.   On: 02/22/2016 12:15   Ct Head Wo Contrast  02/22/2016  CLINICAL DATA:  Altered mental status with increased fatigue and somnolence. Breast carcinoma, currently receiving chemotherapy EXAM: CT HEAD WITHOUT CONTRAST TECHNIQUE: Contiguous axial images were obtained from the base of the skull through the vertex without intravenous contrast. COMPARISON:  September 28, 2015 FINDINGS: There is extensive vasogenic edema throughout much of the right frontal and temporal lobes as well as involving the anterior right occipital lobe. This extensive vasogenic edema extends throughout much of the right basal ganglia region. It effaces a portion of the right lateral ventricle with  patient of the midline structures toward the left by 7 mm. Third ventricle is mildly effaced and shifted toward the left. The fourth ventricle is midline and normal in appearance. There is no hemorrhage. There are no subdural or epidural fluid collections. No acute infarct is evident. The bony calvarium appears intact. The mastoid air cells are clear.  Visualized orbits appear symmetric bilaterally. IMPRESSION: Extensive vasogenic edema on the right causing effacement of the right lateral ventricle and to a lesser extent the third ventricle and midline shift of 7 mm toward the left. This finding most likely is due to underlying mass or masses on the right. No hemorrhage seen. No acute infarct evident. Advise brain MRI pre and post-contrast to further evaluate this area of presumed neoplastic involvement in the right side of the brain. Critical Value/emergent results were called by telephone at the time of interpretation on 02/22/2016 at 12:20 pm to Dr. Zenovia Jarred , who verbally acknowledged these results. Electronically Signed   By: Lowella Grip III M.D.   On: 02/22/2016 12:21   Mr Jeri Cos WC Contrast  02/22/2016  CLINICAL DATA:  Abnormal head CT with vasogenic edema on the right. Metastatic breast cancer. EXAM: MRI HEAD WITHOUT AND WITH CONTRAST TECHNIQUE: Multiplanar, multiecho pulse sequences of the brain and surrounding structures were obtained without and with intravenous contrast. CONTRAST:  70m MULTIHANCE GADOBENATE DIMEGLUMINE 529 MG/ML IV SOLN COMPARISON:  Head CT earlier same day. MRI 08/20/2013. CT 09/28/2015 FINDINGS: No abnormality affects the brainstem or cerebellum. 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 In the right hemisphere, there is an intra-axial mass measuring 3 cm in diameter centered at the deep insula. There is extensive vasogenic edema emanating throughout the right temporal lobe an the right frontoparietal region. There is a  second region of intra-axial enhancing mass in the posterior temporal lobe on the right with vasogenic edema. No other foci are identified. There are micro hemorrhagic foci within these tumors. The differential diagnosis in this case is that of multifocal glioblastoma versus metastatic disease. There is mass effect with maximal right-to-left shift measuring 1 cm. Threatening uncal herniation on the right. No evidence of ventricular trapping. No extra-axial fluid collection. IMPRESSION: Two enhancing masses within the right hemisphere, the larger centered in the deep insula measuring 3 cm in diameter with extensive surrounding vasogenic edema. The smaller is in the posterior temporal lobe measuring approximately 14 mm in size, also with surrounding edema. Mass effect with right-to-left shift of 1 cm. Micro hemorrhages present in both of these lesions. In a patient with a history of metastatic breast cancer, this could certainly represent that process. However, multifocal glioblastoma could also present with this appearance. The processes rapidly progressive, not diagnosable in October of 2016. I am in the process of calling this report to the clinical service. Electronically Signed   By: MNelson ChimesM.D.   On: 02/22/2016 14:53   I have personally reviewed and evaluated these images and lab results as part of my medical decision-making.   EKG Interpretation None      MDM   Final diagnoses:  Brain metastasis ([facility]    Patient's very pleasant 50year old female presenting with worsening fatigue since starting a new chemotherapy therapy on Tuesday. Patient's husband has felt she's more fatigued than usual. Once his sleep more than usual. He also noticed her having slurred speech couple times the last 5 days. On physical exam she has no focal symptoms. She is fatigued. However her abdomen is soft lungs are normal vital signs normal. And cranial nerve exam is normal.  We will get metabolic workup to make  sure patient does not have occult infection. Patient has no fever this time. We will also get CT of her head given her fatigue.  12:28 PM CT showed 7 mm shift with edema.  Discussed with neurology. Keppra and Decadron given. We'll order stat MRI.  3:04 PM Patient MRI just came back. Discussed with radiologist. Concern for uncal herniation potential. Neurosurgery paged.  4:30 PM Discussed with dr. Jonette Eva on call for her primary oncologist.  Neurosurgery said not amenable to surgery, will likely need radiation oncology. Admitted to medicine for continued work/care given new met, large midline shift and symtpoms.   Bert Givans Julio Alm, MD 02/22/16 1630

## 2016-02-22 NOTE — Telephone Encounter (Signed)
This RN returned call to husband per VM left at 804 am. Per message [name] states concerns due to ongoing symptoms post chemo last week.  No answer per given number of [phone].  This RN then received a call from [place] stating per her conversation with husband and symptoms including a fall - advice has been to proceed to the ER. Husband states he is proceeding to the ER at [facility].  This RN called and notified the charge nurse in [facility] ER of pending arrival with report of above.

## 2016-02-22 NOTE — ED Notes (Signed)
Admission doctor at bedside

## 2016-02-22 NOTE — ED Notes (Signed)
Per patient, she is being treated for cancer.  Patient states she is on a lot of new medication.  Last chemo was last Tuesday (it was a new chemo).  Patient states she is having headaches and slurred speech.

## 2016-02-23 ENCOUNTER — Ambulatory Visit: Payer: 59 | Admitting: Hematology and Oncology

## 2016-02-23 ENCOUNTER — Ambulatory Visit
Admit: 2016-02-23 | Discharge: 2016-02-23 | Disposition: A | Discharge status: Home / Self Care | Payer: 59 | Attending: Radiation Oncology | Admitting: Radiation Oncology

## 2016-02-23 ENCOUNTER — Other Ambulatory Visit: Payer: 59

## 2016-02-23 DIAGNOSIS — G8929 Other chronic pain: Secondary | ICD-10-CM

## 2016-02-23 DIAGNOSIS — K5900 Constipation, unspecified: Secondary | ICD-10-CM

## 2016-02-23 DIAGNOSIS — C7931 Secondary malignant neoplasm of brain: Secondary | ICD-10-CM

## 2016-02-23 LAB — URINE CULTURE: Culture: 6000

## 2016-02-23 LAB — BASIC METABOLIC PANEL
ANION GAP: 9 (ref 5–15)
BUN: 14 mg/dL (ref 6–20)
CALCIUM: 9.5 mg/dL (ref 8.9–10.3)
CO2: 35 mmol/L — AB (ref 22–32)
Chloride: 98 mmol/L — ABNORMAL LOW (ref 101–111)
Creatinine, Ser: 0.45 mg/dL (ref 0.44–1.00)
Glucose, Bld: 186 mg/dL — ABNORMAL HIGH (ref 65–99)
Potassium: 3.3 mmol/L — ABNORMAL LOW (ref 3.5–5.1)
Sodium: 142 mmol/L (ref 135–145)

## 2016-02-23 LAB — CBC
HCT: 33.2 % — ABNORMAL LOW (ref 36.0–46.0)
HEMOGLOBIN: 10.8 g/dL — AB (ref 12.0–15.0)
MCH: 36.7 pg — ABNORMAL HIGH (ref 26.0–34.0)
MCHC: 32.5 g/dL (ref 30.0–36.0)
MCV: 112.9 fL — ABNORMAL HIGH (ref 78.0–100.0)
Platelets: 35 10*3/uL — ABNORMAL LOW (ref 150–400)
RBC: 2.94 MIL/uL — AB (ref 3.87–5.11)
RDW: 21.9 % — ABNORMAL HIGH (ref 11.5–15.5)
WBC: 2.3 10*3/uL — ABNORMAL LOW (ref 4.0–10.5)

## 2016-02-23 LAB — MAGNESIUM: MAGNESIUM: 1.7 mg/dL (ref 1.7–2.4)

## 2016-02-23 MED ORDER — LACTULOSE 10 GM/15ML PO SOLN
20.0000 g | Freq: Three times a day (TID) | ORAL | Status: DC
Start: 2016-02-23 — End: 2016-02-24
  Administered 2016-02-23: 20 g via ORAL
  Filled 2016-02-23 (×3): qty 30

## 2016-02-23 MED ORDER — DEXAMETHASONE SODIUM PHOSPHATE 4 MG/ML IJ SOLN
4.0000 mg | Freq: Four times a day (QID) | INTRAMUSCULAR | Status: DC
  Administered 2016-02-23 – 2016-02-24 (×4): 4 mg via INTRAVENOUS
  Filled 2016-02-23 (×4): qty 1

## 2016-02-23 MED ORDER — DEXAMETHASONE SODIUM PHOSPHATE 10 MG/ML IJ SOLN
4.0000 mg | Freq: Four times a day (QID) | INTRAMUSCULAR | Status: DC
  Filled 2016-02-23 (×3): qty 0.4

## 2016-02-23 MED ORDER — PRO-STAT SUGAR FREE PO LIQD
30.0000 mL | Freq: Three times a day (TID) | ORAL | Status: DC
Start: 2016-02-23 — End: 2016-03-05
  Administered 2016-02-23 – 2016-02-24 (×2): 30 mL via ORAL
  Filled 2016-02-23 (×8): qty 30

## 2016-02-23 MED ORDER — SODIUM CHLORIDE 0.9% FLUSH
10.0000 mL | INTRAVENOUS | Status: DC | PRN
  Administered 2016-02-23 – 2016-03-05 (×7): 10 mL
  Filled 2016-02-23 (×7): qty 40

## 2016-02-23 MED ORDER — FLUCONAZOLE 100 MG PO TABS
100.0000 mg | ORAL_TABLET | Freq: Every day | ORAL | Status: DC
  Administered 2016-02-23 – 2016-02-29 (×4): 100 mg via ORAL
  Filled 2016-02-23 (×7): qty 1

## 2016-02-23 MED ORDER — POTASSIUM CHLORIDE CRYS ER 20 MEQ PO TBCR
40.0000 meq | EXTENDED_RELEASE_TABLET | ORAL | Status: AC
  Administered 2016-02-23: 40 meq via ORAL
  Filled 2016-02-23 (×2): qty 2

## 2016-02-23 MED ORDER — LORAZEPAM 2 MG/ML IJ SOLN
0.5000 mg | Freq: Once | INTRAMUSCULAR | Status: AC
  Administered 2016-02-23: 0.5 mg via INTRAVENOUS
  Filled 2016-02-23: qty 1

## 2016-02-23 NOTE — Progress Notes (Signed)
Notified Dr. Wynelle Cleveland that patient have been refusing most of her PO meds.

## 2016-02-23 NOTE — Progress Notes (Signed)
[facility] Progress Note   Kristin Hoffman  [id number]  DOB: 1966/01/08  DOA: 02/22/2016 PCP: Drake Leach, MD  Brief narrative: Kristin Hoffman is a 50 y.o. female with metastatic breast cancer, ventricular thrombus, pancytopenia presents for slurred speech and weakness on the left side. Symptoms started about 5 days ago and haven't progressed. Initially only her speech was slurred now she is weak on the left side and having difficulty ambulating. Imaging in the ER reveals brain metastases. No complaint of numbness tingling, visual changes or slurred speech.   Subjective: No new complaints- sleepy- was given Ativan in Rad oncology- husband notes that facial droop and mobility increased significantly last night.  Assessment/Plan: Principal Problem:   Brain metastasis  -right hemispheric with left facial, arm/ leg weakness with midline shift and severe vasogenic edema- symptoms improving with Decadron - evaluated by Dr Idell Pickles have spoken with her-  she will stay for radiation treatment tomorrow AM -spoke with  Dr Trenton Gammon- recommends to drop Decadron back from 10 mg QID to 4 mg QID can continue at this dose on d/c  Active Problems: Breast cancer metastasized to multiple sites  -Outpatient management has been by Dr Jana Hakim-  on-call oncology Dr Marin Olp seeing her here- appreciate consult - cont home pain regimen  Nausea/ Vomiting - resolved- possibly due to metastasis- PRN Zofran   Thrombocytopenia  -Platelets 24 in setting of full dose Lovenox- given a platelet infusion bringing platelets up to 35  -I have explained to the patient and the family the high risk of bleeding   Ventricular mural thrombus - Dr Jana Hakim last recommended to continue Lovenox despite her low platelets as the benefit outweighed the risk- Dr Marin Olp agrees with this plan -Continue Lovenox (also recommended by cardiology after MRI performed a few days ago)  NOTE: Dr Marin Olp has started  Fluconazole   Antibiotics: Anti-infectives    Start     Dose/Rate Route Frequency Ordered Stop   02/23/16 1000  fluconazole (DIFLUCAN) tablet 100 mg     100 mg Oral Daily 02/23/16 0722     02/22/16 1800  valACYclovir (VALTREX) tablet 1,000 mg     1,000 mg Oral Daily 02/22/16 1652       Code Status:     Code Status Orders        Start     Ordered   02/22/16 1728  Full code   Continuous     02/22/16 1728    Code Status History    Date Active Date Inactive Code Status Order ID Comments User Context   05/29/2015  9:51 PM 05/30/2015  5:54 PM Full Code [id number]  Toy Baker, MD Inpatient   12/17/2014  2:39 PM 12/18/2014  3:42 AM Full Code [id number]  Azzie Roup, MD [place]   11/08/2012  7:19 PM 11/12/2012  9:42 PM Full Code [id number]  Kathalene Frames, MD ED     Family Communication: husband Disposition Plan: home tomorrow DVT prophylaxis: Lovenox Consultants: Onc- medical and radiation, Neurosurgery Procedures:     Objective: Filed Weights   02/22/16 1043  Weight: 73.936 kg (163 lb)    Intake/Output Summary (Last 24 hours) at 02/23/16 1504 Last data filed at 02/23/16 [id number]  Gross per 24 hour  Intake   1735 ml  Output      0 ml  Net   1735 ml     Vitals Filed Vitals:   02/23/16 0106 02/23/16 0121 02/23/16 0256 02/23/16 0622  BP: 123/74 124/82 118/68 159/76  Pulse: 88 81 90 106  Temp: 98.2 F (36.8 C) 97.5 F (36.4 C) 98 F (36.7 C) 98.1 F (36.7 C)  TempSrc: Oral Oral Oral Oral  Resp: 16 16 18 16   Height:      Weight:      SpO2: 91% 94% 94% 94%    Exam:  General:  Pt is alert, not in acute distress  HEENT: No icterus, No thrush, oral mucosa moist- pupils dilated  Cardiovascular: regular rate and rhythm, S1/S2 No murmur  Respiratory: clear to auscultation bilaterally   Abdomen: Soft, +Bowel sounds, non tender, non distended, no guarding  MSK: No cyanosis or clubbing- no pedal edema  Neuro: facial droop resolved, strength still 4/5 in left arm and  leg   Data Reviewed: Basic Metabolic Panel:  Recent Labs Lab 02/22/16 1155 02/23/16 0615  NA 140 142  K 3.4* 3.3*  CL 104 98*  CO2 32 35*  GLUCOSE 129* 186*  BUN 12 14  CREATININE 0.38* 0.45  CALCIUM 8.9 9.5  MG  --  1.7   Liver Function Tests:  Recent Labs Lab 02/22/16 1155  AST 39  ALT 27  ALKPHOS 78  BILITOT 1.1  PROT 5.1*  ALBUMIN 2.9*   No results for input(s): LIPASE, AMYLASE in the last 168 hours.  Recent Labs Lab 02/22/16 1155  AMMONIA 42*   CBC:  Recent Labs Lab 02/22/16 1155 02/23/16 0615  WBC 2.0* 2.3*  NEUTROABS 1.7  --   HGB 10.6* 10.8*  HCT 32.7* 33.2*  MCV 114.3* 112.9*  PLT 24* 35*   Cardiac Enzymes: No results for input(s): CKTOTAL, CKMB, CKMBINDEX, TROPONINI in the last 168 hours. BNP (last 3 results)  Recent Labs  05/29/15 1521  BNP 29.0    ProBNP (last 3 results) No results for input(s): PROBNP in the last 8760 hours.  CBG: No results for input(s): GLUCAP in the last 168 hours.  Recent Results (from the past 240 hour(s))  Urine culture     Status: None (Preliminary result)   Collection Time: 02/22/16  3:13 PM  Result Value Ref Range Status   Specimen Description URINE, CLEAN CATCH  Final   Special Requests NONE  Final   Culture   Final    TOO YOUNG TO READ Performed at [facility]    Report Status PENDING  Incomplete     Studies: Dg Chest 2 View  02/22/2016  CLINICAL DATA:  Chest pain with shortness of breath for several days. History of metastatic breast carcinoma EXAM: CHEST  2 VIEW COMPARISON:  Chest radiograph June 05, 2015; chest CT February 01, 2016 FINDINGS: There is a pulmonary nodular opacity in the left upper lobe near the apex measuring 1.0 x 0.8 cm, essentially stable compared to prior CT examination. The previously noted right middle lobe lesion is not well seen on this current radiographic examination. It is vaguely appreciable on the lateral view where it measures 2.3 x 1.7 cm. There is no  appreciable edema or consolidation. The heart size and pulmonary vascularity are within normal limits. There is equivocal hilar adenopathy on the right. Port-A-Cath tip is at the cavoatrial junction. No bone lesions are evident. There are surgical clips in the left breast and left axillary regions. IMPRESSION: Pulmonary nodular lesions in the left upper lobe and right middle lobe, also noted on prior CT. No frank edema or consolidation. No change in cardiac silhouette. There is questionable right hilar adenopathy currently. No pneumothorax. Postoperative change on left. Electronically Signed  By: Lowella Grip III M.D.   On: 02/22/2016 12:15   Ct Head Wo Contrast  02/22/2016  CLINICAL DATA:  Altered mental status with increased fatigue and somnolence. Breast carcinoma, currently receiving chemotherapy EXAM: CT HEAD WITHOUT CONTRAST TECHNIQUE: Contiguous axial images were obtained from the base of the skull through the vertex without intravenous contrast. COMPARISON:  September 28, 2015 FINDINGS: There is extensive vasogenic edema throughout much of the right frontal and temporal lobes as well as involving the anterior right occipital lobe. This extensive vasogenic edema extends throughout much of the right basal ganglia region. It effaces a portion of the right lateral ventricle with patient of the midline structures toward the left by 7 mm. Third ventricle is mildly effaced and shifted toward the left. The fourth ventricle is midline and normal in appearance. There is no hemorrhage. There are no subdural or epidural fluid collections. No acute infarct is evident. The bony calvarium appears intact. The mastoid air cells are clear. Visualized orbits appear symmetric bilaterally. IMPRESSION: Extensive vasogenic edema on the right causing effacement of the right lateral ventricle and to a lesser extent the third ventricle and midline shift of 7 mm toward the left. This finding most likely is due to underlying  mass or masses on the right. No hemorrhage seen. No acute infarct evident. Advise brain MRI pre and post-contrast to further evaluate this area of presumed neoplastic involvement in the right side of the brain. Critical Value/emergent results were called by telephone at the time of interpretation on 02/22/2016 at 12:20 pm to Dr. Zenovia Jarred , who verbally acknowledged these results. Electronically Signed   By: Lowella Grip III M.D.   On: 02/22/2016 12:21   Mr Jeri Cos [id number] Contrast  02/22/2016  CLINICAL DATA:  Abnormal head CT with vasogenic edema on the right. Metastatic breast cancer. EXAM: MRI HEAD WITHOUT AND WITH CONTRAST TECHNIQUE: Multiplanar, multiecho pulse sequences of the brain and surrounding structures were obtained without and with intravenous contrast. CONTRAST:  43mL MULTIHANCE GADOBENATE DIMEGLUMINE 529 MG/ML IV SOLN COMPARISON:  Head CT earlier same day. MRI 08/20/2013. CT 09/28/2015 FINDINGS: No abnormality affects the brainstem or cerebellum. 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 In the right hemisphere, there is an intra-axial mass measuring 3 cm in diameter centered at the deep insula. There is extensive vasogenic edema emanating throughout the right temporal lobe an the right frontoparietal region. There is a second region of intra-axial enhancing mass in the posterior temporal lobe on the right with vasogenic edema. No other foci are identified. There are micro hemorrhagic foci within these tumors. The differential diagnosis in this case is that of multifocal glioblastoma versus metastatic disease. There is mass effect with maximal right-to-left shift measuring 1 cm. Threatening uncal herniation on the right. No evidence of ventricular trapping. No extra-axial fluid collection. IMPRESSION: Two enhancing masses within the right hemisphere, the larger centered in the deep insula measuring 3 cm in diameter with extensive surrounding  vasogenic edema. The smaller is in the posterior temporal lobe measuring approximately 14 mm in size, also with surrounding edema. Mass effect with right-to-left shift of 1 cm. Micro hemorrhages present in both of these lesions. In a patient with a history of metastatic breast cancer, this could certainly represent that process. However, multifocal glioblastoma could also present with this appearance. The processes rapidly progressive, not diagnosable in October of 2016. I am in the process of calling this report to the clinical service. Electronically  Signed   By: Nelson Chimes M.D.   On: 02/22/2016 14:53    Scheduled Meds:  Scheduled Meds: . sodium chloride   Intravenous Once  . acidophilus  1 capsule Oral q morning - 10a  . dexamethasone  4 mg Intravenous 4 times per day  . docusate sodium  100 mg Oral BID  . enoxaparin  100 mg Subcutaneous Q24H  . escitalopram  20 mg Oral Daily  . feeding supplement (PRO-STAT SUGAR FREE 64)  30 mL Oral TID  . fluconazole  100 mg Oral Daily  . lactulose  20 g Oral TID  . levETIRAcetam  1,000 mg Intravenous Q12H  . methadone  2.5 mg Oral 3 times per day  . methylphenidate  5 mg Oral BID WC  . montelukast  10 mg Oral QHS  . multivitamin with minerals  1 tablet Oral q morning - 10a  . sodium chloride flush  3 mL Intravenous Q12H  . valACYclovir  1,000 mg Oral Daily   Continuous Infusions: . sodium chloride 100 mL/hr at 02/22/16 1905    Time spent on care of this patient: 44 min   [place], MD 02/23/2016, 3:04 PM  LOS: 1 day   [facility] Office  [phone] Pager - Text Page per [email or web address] If 7PM-7AM, please contact night-coverage [email or web address]

## 2016-02-23 NOTE — Consult Note (Signed)
Radiation Oncology         [phone] ________________________________  Initial inpatient Consultation  Name: Kristin Hoffman MRN: [id number]  Date: 02/22/2016  DOB: 1966/03/17  [id number], Meda Coffee, MD  No ref. provider found   REFERRING PHYSICIAN: No ref. provider found  DIAGNOSIS: The primary encounter diagnosis was Brain metastasis ([place]). A diagnosis of Heart burn was also pertinent to this visit.    ICD-9-CM ICD-10-CM   1. Brain metastasis (H[facility]) 198.3 C79.31 LORazepam (ATIVAN) injection 0.5 mg  2. Heart burn 787.1 R12 lactulose (CHRONULAC) 10 GM/15ML solution 20 g     fluconazole (DIFLUCAN) tablet 100 mg    HISTORY OF PRESENT ILLNESS: Kristin Hoffman is a 50 y.o. female seen at the request of Dr. Annette Stable for newly noted metastatic brain lesions. The patient has a history of breast cancer since 2006 with progressive disease over the past 10 years. She has been on multiple courses of chemotherapy with her current regimen under the care of [name] being Vinorelbine which she began on Tuesday of last week.   She has been experiencing increasing changes in speech since Thursday of last week, and as this progressed, she was seen in the ED. A CT of the Head without contrast revealed extensive vasogenic edema on the right causing effacement of the right lateral ventricle and to a lesser extent the third ventricle with a midline shift of 7 mm towards the left. An MRI of the brain with and without contrast was subsequently ordered with and without contrast, in the right hemisphere there is an intra-axial mass measuring 3 cm centered in the deep insula, extensive vasogenic edema emanating through the right temporal lobe and right frontal parietal region was present. A second enhancing mass in the posterior temporal lobe on the right with vasogenic edema measuring 14 mm is present. Mass effect with right-to-left shift of 1 cm was present, and microhemorrhages are present in both of these lesions. Dr.  Annette Stable evaluated the patient yesterday evening, and does not feel that she is not a surgical candidate. Dr. Johny Shears office to see the patient for consideration of stereotactic radiosurgery  PREVIOUS RADIATION THERAPY: Yes, 33 fractions to the left breast completed in July 2007  PAST MEDICAL HISTORY:  Past Medical History  Diagnosis Date  . Depression   . Reflux   . Hx-TIA (transient ischemic attack) 11/22/2013  . Chronic headaches 11/22/2013  . Anxiety 11/22/2013  . Breast cancer ([place])   . Cancer ([place])   . Breast cancer metastasized to multiple sites [facility]) 12/24/2013      PAST SURGICAL HISTORY: Past Surgical History  Procedure Laterality Date  . Mastectomy w/ nodes partial    . Mastectomy    . Breast reconstruction    . Portacath placement      x 2  . Portacath removal      x 2  . Abdominal hysterectomy    . Tee without cardioversion  11/12/2012    Procedure: TRANSESOPHAGEAL ECHOCARDIOGRAM (TEE);  Surgeon: Candee Furbish, MD;  Location: [facility] ENDOSCOPY;  Service: Cardiovascular;  Laterality: N/A;  This TEE may be Dr. Marlou Porch or a LHC Dr    FAMILY HISTORY:  Family History  Problem Relation Age of Onset  . Diabetes Mellitus II Neg Hx   . Hypertension Neg Hx     SOCIAL HISTORY:  Social History   Social History  . Marital Status: Married    Spouse Name: N/A  . Number of Children: N/A  . Years of Education:  N/A   Occupational History  . Not on file.   Social History Main Topics  . Smoking status: Never Smoker   . Smokeless tobacco: Never Used  . Alcohol Use: Yes     Comment: rarely  . Drug Use: No  . Sexual Activity: Yes    Birth Control/ Protection: Surgical   Other Topics Concern  . Not on file   Social History Narrative    ALLERGIES: Afinitor  MEDICATIONS:  Current Facility-Administered Medications  Medication Dose Route Frequency Provider Last Rate Last Dose  . 0.9 %  sodium chloride infusion   Intravenous Continuous Debbe Odea, MD 100 mL/hr at 02/22/16  1905    . 0.9 %  sodium chloride infusion   Intravenous Once Debbe Odea, MD      . acetaminophen (TYLENOL) tablet 650 mg  650 mg Oral Q6H PRN Debbe Odea, MD       Or  . acetaminophen (TYLENOL) suppository 650 mg  650 mg Rectal Q6H PRN Debbe Odea, MD      . acidophilus (RISAQUAD) capsule 1 capsule  1 capsule Oral q morning - 10a Saima Rizwan, MD      . alum & mag hydroxide-simeth (MAALOX/MYLANTA) 200-200-20 MG/5ML suspension 30 mL  30 mL Oral Q6H PRN Debbe Odea, MD      . dexamethasone (DECADRON) injection 10 mg  10 mg Intravenous 4 times per day Debbe Odea, MD   10 mg at 02/23/16 0654  . diphenoxylate-atropine (LOMOTIL) 2.5-0.025 MG per tablet 1-2 tablet  1-2 tablet Oral QID PRN Debbe Odea, MD      . docusate sodium (COLACE) capsule 100 mg  100 mg Oral BID Debbe Odea, MD   100 mg at 02/22/16 2145  . enoxaparin (LOVENOX) injection 100 mg  100 mg Subcutaneous Q24H Debbe Odea, MD   100 mg at 02/22/16 1905  . escitalopram (LEXAPRO) tablet 20 mg  20 mg Oral Daily Debbe Odea, MD   20 mg at 02/22/16 1905  . fluconazole (DIFLUCAN) tablet 100 mg  100 mg Oral Daily Volanda Napoleon, MD      . HYDROcodone-acetaminophen (NORCO/VICODIN) 5-325 MG per tablet 1-2 tablet  1-2 tablet Oral Q4H PRN Debbe Odea, MD   1 tablet at 02/23/16 0851  . lactulose (CHRONULAC) 10 GM/15ML solution 20 g  20 g Oral TID Volanda Napoleon, MD      . levETIRAcetam (KEPPRA) 1,000 mg in sodium chloride 0.9 % 100 mL IVPB  1,000 mg Intravenous Q12H Courteney Lyn Mackuen, MD 440 mL/hr at 02/22/16 2145 1,000 mg at 02/22/16 2145  . LORazepam (ATIVAN) tablet 0.5 mg  0.5 mg Oral QHS PRN Debbe Odea, MD      . methadone (DOLOPHINE) tablet 2.5 mg  2.5 mg Oral 3 times per day Debbe Odea, MD   2.5 mg at 02/23/16 0656  . methylphenidate (RITALIN) tablet 5 mg  5 mg Oral BID WC Debbe Odea, MD   5 mg at 02/23/16 0843  . montelukast (SINGULAIR) tablet 10 mg  10 mg Oral QHS Debbe Odea, MD   10 mg at 02/22/16 2146  . morphine 2  MG/ML injection 2 mg  2 mg Intravenous Q4H PRN Debbe Odea, MD   2 mg at 02/23/16 0648  . multivitamin with minerals tablet 1 tablet  1 tablet Oral q morning - 10a Saima Rizwan, MD      . ondansetron (ZOFRAN) tablet 4 mg  4 mg Oral Q6H PRN Debbe Odea, MD  Or  . ondansetron (ZOFRAN) injection 4 mg  4 mg Intravenous Q6H PRN Debbe Odea, MD   4 mg at 02/23/16 0648  . potassium chloride SA (K-DUR,KLOR-CON) CR tablet 40 mEq  40 mEq Oral Q4H Saima Rizwan, MD      . sodium chloride flush (NS) 0.9 % injection 10-40 mL  10-40 mL Intracatheter PRN Debbe Odea, MD   10 mL at 02/23/16 0615  . sodium chloride flush (NS) 0.9 % injection 3 mL  3 mL Intravenous Q12H Debbe Odea, MD   3 mL at 02/22/16 2146  . traMADol (ULTRAM) tablet 50-100 mg  50-100 mg Oral Q6H PRN Debbe Odea, MD      . valACYclovir (VALTREX) tablet 1,000 mg  1,000 mg Oral Daily Debbe Odea, MD        REVIEW OF SYSTEMS:  On review of systems the patient reports that overall The patient states that she feels that her speech is getting better but is very muffled and garbled. She takes several seconds to search for the word that she is looking for, she relies on her husband to supply much of her history. She has not complained to him about double vision or blurred vision but apparently has had some trouble with dry I with her previous chemotherapy regimen. She still continues to experience this. The patient's husband states that yesterday they noticed her eyes were becoming more dilated, she has not experienced or complained of auditory disturbances but has had episodes of intermittent nausea since her treatment last week which was felt to possibly be a result of her chemotherapy at that time. She complains of right-sided temporal headaches, that have been present for several scribe's constant but cannot describe alleviating or worsening factors. She is been somewhat unstable when walking, She denies any chest pain or shortness of breath  fevers or chills, unintended weight changes, or increasing night sweats. A complete review of systems is obtained and is otherwise negative.   PHYSICAL EXAM:  height is 5\' 3"  (1.6 m) and weight is 163 lb (73.936 kg). Her oral temperature is 98.1 F (36.7 C). Her blood pressure is 159/76 and her pulse is 106. Her respiration is 16 and oxygen saturation is 94%.   Pain Scale 0/10  In general this is a chronically ill-appearing Caucasian female in no acute distress. She's alert and oriented 4 and appropriate throughout the examination somewhat sleepy. Pupils are dilated, the right at approximately 4 mm, and the left nearly 5 mm. They are both reactive to light and accommodation. EOMs appear intact, demonstrating this is somewhat difficult as the patient has a hard time participating due to sleepiness. She seems to experience generalized weakness but again this is limited in terms of being able to fully assess this as she is quite sleepy.  KPS = 40  100 - Normal; no complaints; no evidence of disease. 90   - Able to carry on normal activity; minor signs or symptoms of disease. 80   - Normal activity with effort; some signs or symptoms of disease. 44   - Cares for self; unable to carry on normal activity or to do active work. 60   - Requires occasional assistance, but is able to care for most of his personal needs. 50   - Requires considerable assistance and frequent medical care. 82   - Disabled; requires special care and assistance. 42   - Severely disabled; hospital admission is indicated although death not imminent. 20   - Very sick;  hospital admission necessary; active supportive treatment necessary. 10   - Moribund; fatal processes progressing rapidly. 0     - Dead  Karnofsky DA, Abelmann [place], Craver LS and Burchenal [facility] [phone]) The use of the nitrogen mustards in the palliative treatment of carcinoma: with particular reference to bronchogenic carcinoma Cancer 1 634-56  LABORATORY DATA:  Lab  Results  Component Value Date   WBC 2.3* 02/23/2016   HGB 10.8* 02/23/2016   HCT 33.2* 02/23/2016   MCV 112.9* 02/23/2016   PLT 35* 02/23/2016   Lab Results  Component Value Date   NA 142 02/23/2016   K 3.3* 02/23/2016   CL 98* 02/23/2016   CO2 35* 02/23/2016   Lab Results  Component Value Date   ALT 27 02/22/2016   AST 39 02/22/2016   ALKPHOS 78 02/22/2016   BILITOT 1.1 02/22/2016     RADIOGRAPHY: Dg Chest 2 View  02/22/2016  CLINICAL DATA:  Chest pain with shortness of breath for several days. History of metastatic breast carcinoma EXAM: CHEST  2 VIEW COMPARISON:  Chest radiograph June 05, 2015; chest CT February 01, 2016 FINDINGS: There is a pulmonary nodular opacity in the left upper lobe near the apex measuring 1.0 x 0.8 cm, essentially stable compared to prior CT examination. The previously noted right middle lobe lesion is not well seen on this current radiographic examination. It is vaguely appreciable on the lateral view where it measures 2.3 x 1.7 cm. There is no appreciable edema or consolidation. The heart size and pulmonary vascularity are within normal limits. There is equivocal hilar adenopathy on the right. Port-A-Cath tip is at the cavoatrial junction. No bone lesions are evident. There are surgical clips in the left breast and left axillary regions. IMPRESSION: Pulmonary nodular lesions in the left upper lobe and right middle lobe, also noted on prior CT. No frank edema or consolidation. No change in cardiac silhouette. There is questionable right hilar adenopathy currently. No pneumothorax. Postoperative change on left. Electronically Signed   By: Lowella Grip III M.D.   On: 02/22/2016 12:15   Ct Head Wo Contrast  02/22/2016  CLINICAL DATA:  Altered mental status with increased fatigue and somnolence. Breast carcinoma, currently receiving chemotherapy EXAM: CT HEAD WITHOUT CONTRAST TECHNIQUE: Contiguous axial images were obtained from the base of the skull through  the vertex without intravenous contrast. COMPARISON:  September 28, 2015 FINDINGS: There is extensive vasogenic edema throughout much of the right frontal and temporal lobes as well as involving the anterior right occipital lobe. This extensive vasogenic edema extends throughout much of the right basal ganglia region. It effaces a portion of the right lateral ventricle with patient of the midline structures toward the left by 7 mm. Third ventricle is mildly effaced and shifted toward the left. The fourth ventricle is midline and normal in appearance. There is no hemorrhage. There are no subdural or epidural fluid collections. No acute infarct is evident. The bony calvarium appears intact. The mastoid air cells are clear. Visualized orbits appear symmetric bilaterally. IMPRESSION: Extensive vasogenic edema on the right causing effacement of the right lateral ventricle and to a lesser extent the third ventricle and midline shift of 7 mm toward the left. This finding most likely is due to underlying mass or masses on the right. No hemorrhage seen. No acute infarct evident. Advise brain MRI pre and post-contrast to further evaluate this area of presumed neoplastic involvement in the right side of the brain. Critical Value/emergent results were called by  telephone at the time of interpretation on 02/22/2016 at 12:20 pm to Dr. Zenovia Jarred , who verbally acknowledged these results. Electronically Signed   By: Lowella Grip III M.D.   On: 02/22/2016 12:21   Ct Chest W Contrast  02/01/2016  CLINICAL DATA:  Breast cancer with hepatic metastatic disease. Recent new round of chemotherapy, restaging. EXAM: CT CHEST, ABDOMEN, AND PELVIS WITH CONTRAST TECHNIQUE: Multidetector CT imaging of the chest, abdomen and pelvis was performed following the standard protocol during bolus administration of intravenous contrast. CONTRAST:  38mL OMNIPAQUE IOHEXOL 300 MG/ML SOLN, 185mL OMNIPAQUE IOHEXOL 300 MG/ML SOLN COMPARISON:   11/23/2015 FINDINGS: CT CHEST FINDINGS Mediastinum/Nodes: Left supraclavicular node, 1.3 cm in short axis on image 6 series 2, previously measured at 1.2 cm in short axis. Aberrant right subclavian artery passes behind the esophagus. Bilateral breast implants interposed between the pectoralis muscles. Left axillary clips. Thin calcified subcarinal node, stable. Lungs/Pleura: Small left pleural effusion. Pleural thickening anteriorly on the left side along the axilla, but less thickening than previous, currently 1.7 cm in thickness, previously 2.2 cm. There is some mild nodularity along the anteromedial pleural margin on the left with faint calcification. This is similar to prior. Trace right pleural fluid observed with a somewhat pedunculated pleural-based lesion extending down towards the lung measuring 2.2 by 1.7 cm on image 27 series 4, previously 2.1 by 1.6 cm. As before, there is peripheral reticulonodular opacities in the lungs with secondary interstitial lobular thickening. Lobularity of the pleural adipose tissue along the right hemidiaphragm appears increased, with slight nodularity. A sub solid subpleural left apical nodule measures 1.1 by 0.8 cm on image 9 series 4, previously 0.8 by 0.5 cm. Musculoskeletal: Unremarkable CT ABDOMEN PELVIS FINDINGS Hepatobiliary: Perihepatic ascites with hypodensity anteriorly in the right hepatic lobe previously 2.0 by 2.0 cm by my measurements on parasagittal images, and currently 1.5 by 1.8 cm on image 31 series 303. Diffuse hepatic steatosis. Pancreas: Primarily atrophic pancreas but with indistinct low-density lymph nodes difficult separate from the upper margin of the pancreatic head, hypodensity in this vicinity measuring 1.9 by 1.6 cm, previously 1.7 by 2.3 cm by my measurements. Spleen: Unremarkable Adrenals/Urinary Tract: Unremarkable Stomach/Bowel: Unremarkable Vascular/Lymphatic: Indistinct speckled hypodensity in calcification in the porta hepatis and upper  peripancreatic region above the pancreatic head, not appreciably changed from prior. This likely represents partially calcified adenopathy. Reproductive: Uterus absent.  Ovaries not well seen. Other: Pelvic ascites noted. Perihepatic and perisplenic ascites along with a small amount of ascites in the right paracolic gutter. Musculoskeletal: Unremarkable IMPRESSION: 1. Although greatly improved from June 2016, compared to 11/23/15 there is a generally mixed appearance, with improvement in size of an anterior hepatic hypodense lesion, but with slight enlargement of a left upper lobe lesion and of a pleural-based right upper lobe lesion. Essentially stable left supraclavicular lymph node remains enlarged. 2. Small left and trace right pleural effusions. Mild to moderate abdominal ascites. 3. Essentially stable appearance of hypodense conglomerate lymph nodes with speckled calcification along the porta hepatis and upper margin of the pancreatic head. A cystic lesion of the pancreatic head appears to correspond to a prior metastatic lesion. Electronically Signed   By: Van Clines M.D.   On: 02/01/2016 15:20   Mr Jeri Cos [id number] Contrast  02/22/2016  CLINICAL DATA:  Abnormal head CT with vasogenic edema on the right. Metastatic breast cancer. EXAM: MRI HEAD WITHOUT AND WITH CONTRAST TECHNIQUE: Multiplanar, multiecho pulse sequences of the brain and surrounding structures were obtained without  and with intravenous contrast. CONTRAST:  28mL MULTIHANCE GADOBENATE DIMEGLUMINE 529 MG/ML IV SOLN COMPARISON:  Head CT earlier same day. MRI 08/20/2013. CT 09/28/2015 FINDINGS: No abnormality affects the brainstem or cerebellum. 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 In the right hemisphere, there is an intra-axial mass measuring 3 cm in diameter centered at the deep insula. There is extensive vasogenic edema emanating throughout the right temporal lobe an the right frontoparietal  region. There is a second region of intra-axial enhancing mass in the posterior temporal lobe on the right with vasogenic edema. No other foci are identified. There are micro hemorrhagic foci within these tumors. The differential diagnosis in this case is that of multifocal glioblastoma versus metastatic disease. There is mass effect with maximal right-to-left shift measuring 1 cm. Threatening uncal herniation on the right. No evidence of ventricular trapping. No extra-axial fluid collection. IMPRESSION: Two enhancing masses within the right hemisphere, the larger centered in the deep insula measuring 3 cm in diameter with extensive surrounding vasogenic edema. The smaller is in the posterior temporal lobe measuring approximately 14 mm in size, also with surrounding edema. Mass effect with right-to-left shift of 1 cm. Micro hemorrhages present in both of these lesions. In a patient with a history of metastatic breast cancer, this could certainly represent that process. However, multifocal glioblastoma could also present with this appearance. The processes rapidly progressive, not diagnosable in October of 2016. I am in the process of calling this report to the clinical service. Electronically Signed   By: Nelson Chimes M.D.   On: 02/22/2016 14:53   Ct Abdomen Pelvis W Contrast  02/01/2016  CLINICAL DATA:  Breast cancer with hepatic metastatic disease. Recent new round of chemotherapy, restaging. EXAM: CT CHEST, ABDOMEN, AND PELVIS WITH CONTRAST TECHNIQUE: Multidetector CT imaging of the chest, abdomen and pelvis was performed following the standard protocol during bolus administration of intravenous contrast. CONTRAST:  64mL OMNIPAQUE IOHEXOL 300 MG/ML SOLN, 170mL OMNIPAQUE IOHEXOL 300 MG/ML SOLN COMPARISON:  11/23/2015 FINDINGS: CT CHEST FINDINGS Mediastinum/Nodes: Left supraclavicular node, 1.3 cm in short axis on image 6 series 2, previously measured at 1.2 cm in short axis. Aberrant right subclavian artery  passes behind the esophagus. Bilateral breast implants interposed between the pectoralis muscles. Left axillary clips. Thin calcified subcarinal node, stable. Lungs/Pleura: Small left pleural effusion. Pleural thickening anteriorly on the left side along the axilla, but less thickening than previous, currently 1.7 cm in thickness, previously 2.2 cm. There is some mild nodularity along the anteromedial pleural margin on the left with faint calcification. This is similar to prior. Trace right pleural fluid observed with a somewhat pedunculated pleural-based lesion extending down towards the lung measuring 2.2 by 1.7 cm on image 27 series 4, previously 2.1 by 1.6 cm. As before, there is peripheral reticulonodular opacities in the lungs with secondary interstitial lobular thickening. Lobularity of the pleural adipose tissue along the right hemidiaphragm appears increased, with slight nodularity. A sub solid subpleural left apical nodule measures 1.1 by 0.8 cm on image 9 series 4, previously 0.8 by 0.5 cm. Musculoskeletal: Unremarkable CT ABDOMEN PELVIS FINDINGS Hepatobiliary: Perihepatic ascites with hypodensity anteriorly in the right hepatic lobe previously 2.0 by 2.0 cm by my measurements on parasagittal images, and currently 1.5 by 1.8 cm on image 31 series 303. Diffuse hepatic steatosis. Pancreas: Primarily atrophic pancreas but with indistinct low-density lymph nodes difficult separate from the upper margin of the pancreatic head, hypodensity in this vicinity measuring 1.9 by  1.6 cm, previously 1.7 by 2.3 cm by my measurements. Spleen: Unremarkable Adrenals/Urinary Tract: Unremarkable Stomach/Bowel: Unremarkable Vascular/Lymphatic: Indistinct speckled hypodensity in calcification in the porta hepatis and upper peripancreatic region above the pancreatic head, not appreciably changed from prior. This likely represents partially calcified adenopathy. Reproductive: Uterus absent.  Ovaries not well seen. Other: Pelvic  ascites noted. Perihepatic and perisplenic ascites along with a small amount of ascites in the right paracolic gutter. Musculoskeletal: Unremarkable IMPRESSION: 1. Although greatly improved from June 2016, compared to 11/23/15 there is a generally mixed appearance, with improvement in size of an anterior hepatic hypodense lesion, but with slight enlargement of a left upper lobe lesion and of a pleural-based right upper lobe lesion. Essentially stable left supraclavicular lymph node remains enlarged. 2. Small left and trace right pleural effusions. Mild to moderate abdominal ascites. 3. Essentially stable appearance of hypodense conglomerate lymph nodes with speckled calcification along the porta hepatis and upper margin of the pancreatic head. A cystic lesion of the pancreatic head appears to correspond to a prior metastatic lesion. Electronically Signed   By: Van Clines M.D.   On: 02/01/2016 15:20   Mr Card Morphology Wo/w Cm  02/21/2016  CLINICAL DATA:  50 year old female with breast cancer and a left ventricular mass seen on echocardiogram. EXAM: CARDIAC MRI TECHNIQUE: The patient was scanned on a 1.5 Tesla GE magnet. A dedicated cardiac coil was used. Functional imaging was done using Fiesta sequences. 2,3, and 4 chamber views were done to assess for RWMA's. Modified Simpson's rule using a short axis stack was used to calculate an ejection fraction on a dedicated work Conservation officer, nature. The patient received 24 cc of Multihance. After 10 minutes inversion recovery sequences were used to assess for infiltration and scar tissue. CONTRAST:  24 cc  of Multihance COMPARISON:  Echocardiogram performed on 02/08/2016 FINDINGS: 1. Mildly dilated left ventricle with normal thickness and mildly decreased systolic function (LVEF = 47%). There is hypokinesis in the apex, apical segments and mid anteroseptal walls. There is no late gadolinium enhancement in the left ventricular endocardium. LVEDD:  55 mm  LVESD:  43 mm LVEDV:  140 ml LVESV:  74 ml SV:  66 ml CO:  6.5 L/minute 2.  Normal right ventricular size, thickness and systolic function. 3.  Normal biatrial size. 4.  Mild mitral and trivial tricuspid regurgitation. 5. There is a pedunculated, mobile mass attached is attached with a thin neck to the left ventricular apex that measures 13 x 9 mm. There is no perfusion or late gadolinium enhancement on regular and long TI images consistent with a thrombus. 6.  No pericardial effusion. IMPRESSION: 1. Mildly dilated left ventricle with normal thickness and mildly decreased systolic function (LVEF = 47%). There is hypokinesis in the apex, apical segments and mid anteroseptal walls. There is no late gadolinium enhancement in the left ventricular endocardium. 2.  Normal right ventricular size, thickness and systolic function. 3.  Mild mitral and trivial tricuspid regurgitation. 4. There is a pedunculated, mobile mass attached is attached with a thin neck to the left ventricular apex that measures 13 x 9 mm. There is no perfusion or late gadolinium enhancement on regular and long TI images consistent with a thrombus. When compared to the echocardiogram from 02/08/2016 the thrombus is smaller, previously measuring 16 x 13 mm. A continuation of systemic anticoagulation is recommended. 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  PLAN: Dr. Tammi Klippel has evaluated the patient as well, and recommends a stereotactic radiosurgery to the brain. We discussed the rationale for radiotherapy, and outlined conventional radiation therapy versus radiosurgery. Dr. Tammi Klippel recommends 3 fractions to the larger lesion in 1 fraction to smaller of the two. Written consent is obtained and signed by her husband at, her request.  She is interested in moving forward as is her  husband. We will contact Dr. Annette Stable to attempt to coordinate her radiation treatment in the next few days. We recommend continuation of her steroids, and will anticipate a slow taper over the course of about 4 weeks time after treatment.  Carola Rhine, PAC

## 2016-02-23 NOTE — Consult Note (Signed)
Referral MD  Reason for Referral: Metastatic breast cancer with new CNS metastasis Chief Complaint  Patient presents with  . Aphasia  : Patient is not sure why she is in the hospital.  HPI: Kristin Hoffman is a very charming 50 year old white female. She has known metastatic breast cancer. She has been fighting her disease for several years. She was found have metastatic disease in December 2014. Prior to this, she had bilateral mastectomies. She had hysterectomy with ovaries removed. She's been on multiple chemotherapy cycles and antiestrogen cycles.  She was recently started Navelbine area and she had her first cycle on 02/16/2016.  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 She is on Lovenox.  She appears to again some confusion the day after her treatment last week. Her husband was not sure if this is from the treatment or some else is going on. These got worse. She ultimately came to the emergency room on Monday. She had a MRI of the brain done. She's not had 2 lesions. They were oral within the right hemisphere. Largest measured 3 cm. Smaller one measured 1.4 cm. She had surrounding edema.  She was seen by neurosurgery. There is no indication for resection given her metastatic disease.  She's been seen by radiation oncology who feels that stereotactic radiosurgery might be an option.  She's doing much better this morning. Steroids are helping. I told her husband that a lot of the symptoms are from swelling not the actual tumor itself.  Her biggest complaint is been constipation. She is on quite a few medications. Maybe Swatch plus will help.  She really does not give me much history. Her husband does most of the talking and he is very into an with what is going on.  Her labs which came and were not that remarkable area and her albumin was 2.9. Her calcium was 8.9. Sodium was 140. Her white cell counts 2. Hemogram 10.6. Platelet count 24,000. She has a chronic thrombocytopenia issues.  This likely is from all of her past treatments. Progression also has some cardiomyopathy. Her last echocardiogram done in late February showed an ejection fraction of 40-45%.  She does have chronic pain. This likely is from her metastatic disease. She does not have a cough. She's had no bleeding. She's had no leg swelling. She's had some headache. This appears to be a little better.  Overall, her performance status is probably ECOG 1-2.    Past Medical History  Diagnosis Date  . Depression   . Reflux   . Hx-TIA (transient ischemic attack) 11/22/2013  . Chronic headaches 11/22/2013  . Anxiety 11/22/2013  . Breast cancer ([place])   . Cancer ([place])   . Breast cancer metastasized to multiple sites [facility]) 12/24/2013  :  Past Surgical History  Procedure Laterality Date  . Mastectomy w/ nodes partial    . Mastectomy    . Breast reconstruction    . Portacath placement      x 2  . Portacath removal      x 2  . Abdominal hysterectomy    . Tee without cardioversion  11/12/2012    Procedure: TRANSESOPHAGEAL ECHOCARDIOGRAM (TEE);  Surgeon: Candee Furbish, MD;  Location: [facility] ENDOSCOPY;  Service: Cardiovascular;  Laterality: N/A;  This TEE may be Dr. Marlou Porch or a [facility] Dr  :   Current facility-administered medications:  .  0.9 %  sodium chloride infusion, , Intravenous, Continuous, Debbe Odea, MD, Last Rate: 100 mL/hr at 02/22/16 1905 .  0.9 %  sodium chloride infusion, , Intravenous, Once, Debbe Odea, MD .  acetaminophen (TYLENOL) tablet 650 mg, 650 mg, Oral, Q6H PRN **OR** acetaminophen (TYLENOL) suppository 650 mg, 650 mg, Rectal, Q6H PRN, Debbe Odea, MD .  acidophilus (RISAQUAD) capsule 1 capsule, 1 capsule, Oral, q morning - 10a, Saima Rizwan, MD .  alum & mag hydroxide-simeth (MAALOX/MYLANTA) 200-200-20 MG/5ML suspension 30 mL, 30 mL, Oral, Q6H PRN, Debbe Odea, MD .  dexamethasone (DECADRON) injection 10 mg, 10 mg, Intravenous, 4 times per day, Debbe Odea, MD, 10 mg at 02/23/16 0654 .   diphenoxylate-atropine (LOMOTIL) 2.5-0.025 MG per tablet 1-2 tablet, 1-2 tablet, Oral, QID PRN, Debbe Odea, MD .  docusate sodium (COLACE) capsule 100 mg, 100 mg, Oral, BID, Debbe Odea, MD, 100 mg at 02/22/16 2145 .  enoxaparin (LOVENOX) injection 100 mg, 100 mg, Subcutaneous, Q24H, Debbe Odea, MD, 100 mg at 02/22/16 1905 .  escitalopram (LEXAPRO) tablet 20 mg, 20 mg, Oral, Daily, Debbe Odea, MD, 20 mg at 02/22/16 1905 .  fluconazole (DIFLUCAN) tablet 100 mg, 100 mg, Oral, Daily, Volanda Napoleon, MD .  HYDROcodone-acetaminophen (NORCO/VICODIN) 5-325 MG per tablet 1-2 tablet, 1-2 tablet, Oral, Q4H PRN, Debbe Odea, MD, 2 tablet at 02/22/16 2223 .  lactulose (CHRONULAC) 10 GM/15ML solution 20 g, 20 g, Oral, TID, Volanda Napoleon, MD .  levETIRAcetam (KEPPRA) 1,000 mg in sodium chloride 0.9 % 100 mL IVPB, 1,000 mg, Intravenous, Q12H, Courteney Lyn Mackuen, MD, Last Rate: 440 mL/hr at 02/22/16 2145, 1,000 mg at 02/22/16 2145 .  LORazepam (ATIVAN) tablet 0.5 mg, 0.5 mg, Oral, QHS PRN, Debbe Odea, MD .  methadone (DOLOPHINE) tablet 2.5 mg, 2.5 mg, Oral, 3 times per day, Debbe Odea, MD, 2.5 mg at 02/23/16 0656 .  methylphenidate (RITALIN) tablet 5 mg, 5 mg, Oral, BID WC, Saima Rizwan, MD .  montelukast (SINGULAIR) tablet 10 mg, 10 mg, Oral, QHS, Debbe Odea, MD, 10 mg at 02/22/16 2146 .  morphine 2 MG/ML injection 2 mg, 2 mg, Intravenous, Q4H PRN, Debbe Odea, MD, 2 mg at 02/23/16 0648 .  multivitamin with minerals tablet 1 tablet, 1 tablet, Oral, q morning - 10a, Saima Rizwan, MD .  ondansetron (ZOFRAN) tablet 4 mg, 4 mg, Oral, Q6H PRN **OR** ondansetron (ZOFRAN) injection 4 mg, 4 mg, Intravenous, Q6H PRN, Debbe Odea, MD, 4 mg at 02/23/16 0648 .  sodium chloride flush (NS) 0.9 % injection 10-40 mL, 10-40 mL, Intracatheter, PRN, Debbe Odea, MD .  sodium chloride flush (NS) 0.9 % injection 3 mL, 3 mL, Intravenous, Q12H, Debbe Odea, MD, 3 mL at 02/22/16 2146 .  traMADol (ULTRAM) tablet  50-100 mg, 50-100 mg, Oral, Q6H PRN, Debbe Odea, MD .  valACYclovir (VALTREX) tablet 1,000 mg, 1,000 mg, Oral, Daily, Saima Rizwan, MD:  . sodium chloride   Intravenous Once  . acidophilus  1 capsule Oral q morning - 10a  . dexamethasone  10 mg Intravenous 4 times per day  . docusate sodium  100 mg Oral BID  . enoxaparin  100 mg Subcutaneous Q24H  . escitalopram  20 mg Oral Daily  . fluconazole  100 mg Oral Daily  . lactulose  20 g Oral TID  . levETIRAcetam  1,000 mg Intravenous Q12H  . methadone  2.5 mg Oral 3 times per day  . methylphenidate  5 mg Oral BID WC  . montelukast  10 mg Oral QHS  . multivitamin with minerals  1 tablet Oral q morning - 10a  . sodium chloride flush  3 mL  Intravenous Q12H  . valACYclovir  1,000 mg Oral Daily  :  Allergies  Allergen Reactions  . Afinitor [Everolimus] Hives and Itching  :  Family History  Problem Relation Age of Onset  . Diabetes Mellitus II Neg Hx   . Hypertension Neg Hx   :  Social History   Social History  . Marital Status: Married    Spouse Name: N/A  . Number of Children: N/A  . Years of Education: N/A   Occupational History  . Not on file.   Social History Main Topics  . Smoking status: Never Smoker   . Smokeless tobacco: Never Used  . Alcohol Use: Yes     Comment: rarely  . Drug Use: No  . Sexual Activity: Yes    Birth Control/ Protection: Surgical   Other Topics Concern  . Not on file   Social History Narrative  :  Pertinent items are noted in HPI.  Exam: Patient Vitals for the past 24 hrs:  BP Temp Temp src Pulse Resp SpO2 Height Weight  02/23/16 0622 (!) 159/76 mmHg 98.1 F (36.7 C) Oral (!) 106 16 94 % - -  02/23/16 0256 118/68 mmHg 98 F (36.7 C) Oral 90 18 94 % - -  02/23/16 0121 124/82 mmHg 97.5 F (36.4 C) Oral 81 16 94 % - -  02/23/16 0106 123/74 mmHg 98.2 F (36.8 C) Oral 88 16 91 % - -  02/23/16 0034 127/79 mmHg 98 F (36.7 C) Oral 94 16 94 % - -  02/22/16 1959 131/83 mmHg 98.4 F  (36.9 C) Oral 88 16 100 % - -  02/22/16 1700 137/88 mmHg 98.3 F (36.8 C) Oral 92 18 94 % - -  02/22/16 1513 (!) 147/110 mmHg - - 95 18 95 % - -  02/22/16 1046 - - - - - 93 % - -  02/22/16 1043 134/99 mmHg 98.4 F (36.9 C) Oral 100 19 95 % 5\' 3"  (1.6 m) 163 lb (73.936 kg)  02/22/16 1039 - - - - - 95 % - -   As above    Recent Labs  02/22/16 1155 02/23/16 0615  WBC 2.0* 2.3*  HGB 10.6* 10.8*  HCT 32.7* 33.2*  PLT 24* 35*    Recent Labs  02/22/16 1155  NA 140  K 3.4*  CL 104  CO2 32  GLUCOSE 129*  BUN 12  CREATININE 0.38*  CALCIUM 8.9    Blood smear review:  None  Pathology: None     Assessment and Plan:  Kristin Hoffman is a 50 year old white female with metastatic breast cancer. She has had a very impressive course and really considering all she's been treated with, has done very nicely.  It is no surprise that she started having brain metastasis. Thankfully, there are only 2 lesions. Hopefully this can be treated with stereotactic radiosurgery. I suspect that she probably will be a risk for other episodes of CNS metastases.  She seems very nice. Her husband is very nice. Had a nice on with them this morning. This definitely changes her prognosis from my point of view. However, I don't think that they would be willing to discuss CODE STATUS. I understand this.  I don't think that the radiation should affect her getting chemotherapy with the Navelbine. She is due for treatment today but as she is an inpatient, her temperature was removed to next week.  The steroids have definitely helped her. I'll put her on some Diflucan to minimize the  risk of thrush.  Again, she should continue to improve as the CNS swelling decreases with the use of Decadron. I probably would have her on an outpatient dose of 8 mg 3 times a day. I also would make sure that she is on a PPI.  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 I would continue her on the Lovenox for right now.  We will  follow her along.  I appreciate the outstanding care that she is getting up on 4 W.   Pete E.  Romans 8:28

## 2016-02-23 NOTE — Progress Notes (Signed)
Initial Nutrition Assessment  DOCUMENTATION CODES:   Severe malnutrition in context of chronic illness  INTERVENTION:   - Provide patient Prostat po TID (mixed in beverage of patient choice), each supplement provides 100 kcals and 15 grams of protein. - Provide patient with snacks from unit as requested.  - Recommend providing Magic Mouthwash for patient due to mouth sores. - RD will continue to monitor for supplement acceptance and nutritional needs.   NUTRITION DIAGNOSIS:   Malnutrition related to chronic illness as evidenced by percent weight loss, energy intake < or equal to 75% for > or equal to 1 month, moderate depletion of body fat, moderate depletions of muscle mass.  GOAL:   Patient will meet greater than or equal to 90% of their needs  MONITOR:   PO intake, Supplement acceptance, Labs, Weight trends, Skin  REASON FOR ASSESSMENT:   Malnutrition Screening Tool    ASSESSMENT:   50 y.o. female with metastatic breast cancer actively undergoing chemotherapy (last session 3/7), ventricular thrombus, pancytopenia presents for slurred speech and weakness on the left side. Symptoms started about 5 days ago and haven't progressed. Initially only her speech was slurred now she is weak on the left side and having difficulty ambulating. Imaging in the ER reveals brain metastases. No complaint of numbness tingling, visual changes or slurred speech.   Patient sleepy and lethargic at time of visit.  Spoke with pt's husband who reports that pt has had very little to eat. States that she has had nothing the past two days and some green beans for lunch on Sunday with emesis shortly after.  Reports that she has been drinking water, coke, and apple juice.  Pt states that her foods do not taste the same anymore.  Reports that she has sores in her mouth from the chemo and that she has been rinsing her mouth out at home with a special mouthwash that she could not recall the name of.  Recommend  continuing a mouthwash regimen in the hospital.  Husband expresses concern for patient having enough calorie and protein intake.  Husband amenable to patient trying Prostat mixed in a beverage of patient choice.     Patient has pale appearance. Nutrition Focused Physical Exam was completed.  Findings include mild to moderate fat and muscle depletion.  Husband reports weight loss of 20 lbs over about 4 months.  Per chart review, patient has lost 17 lbs, a significant 9% decrease in weight x 2 months.   Medications reviewed: MVI. Labs reviewed: potassium low (3.3), glucose elevated (186).  Diet Order:  Diet Heart Room service appropriate?: Yes; Fluid consistency:: Thin  Skin:  Reviewed, no issues  Last BM:  Unknown  Height:   Ht Readings from Last 1 Encounters:  02/22/16 5\' 3"  (1.6 m)    Weight:   Wt Readings from Last 1 Encounters:  02/22/16 163 lb (73.936 kg)    Ideal Body Weight:  52.3 kg  BMI:  Body mass index is 28.88 kg/(m^2).  Estimated Nutritional Needs:   Kcal:  2000-2200  Protein:  100-115 grams  Fluid:  >/= 2L  EDUCATION NEEDS:   No education needs identified at this time  Veronda Prude, Dietetic Intern Pager: [phone]

## 2016-02-24 ENCOUNTER — Ambulatory Visit
Admit: 2016-02-24 | Discharge: 2016-02-24 | Disposition: A | Discharge status: Home / Self Care | Payer: 59 | Attending: Radiation Oncology | Admitting: Radiation Oncology

## 2016-02-24 DIAGNOSIS — C7931 Secondary malignant neoplasm of brain: Secondary | ICD-10-CM

## 2016-02-24 DIAGNOSIS — D72819 Decreased white blood cell count, unspecified: Secondary | ICD-10-CM | POA: Insufficient documentation

## 2016-02-24 LAB — BASIC METABOLIC PANEL
Anion gap: 6 (ref 5–15)
BUN: 14 mg/dL (ref 6–20)
CHLORIDE: 101 mmol/L (ref 101–111)
CO2: 35 mmol/L — AB (ref 22–32)
CREATININE: 0.42 mg/dL — AB (ref 0.44–1.00)
Calcium: 9.3 mg/dL (ref 8.9–10.3)
GFR calc Af Amer: >60 mL/min (ref >60)
GFR calc non Af Amer: >60 mL/min (ref >60)
GLUCOSE: 145 mg/dL — AB (ref 65–99)
Potassium: 3.8 mmol/L (ref 3.5–5.1)
Sodium: 142 mmol/L (ref 135–145)

## 2016-02-24 LAB — PREPARE PLATELET PHERESIS: Unit division: 0

## 2016-02-24 MED ORDER — TBO-FILGRASTIM 300 MCG/0.5ML ~~LOC~~ SOSY
300.0000 ug | PREFILLED_SYRINGE | Freq: Every day | SUBCUTANEOUS | Status: DC
  Administered 2016-02-24 – 2016-02-25 (×2): 300 ug via SUBCUTANEOUS
  Filled 2016-02-24 (×4): qty 0.5

## 2016-02-24 MED ORDER — MAGIC MOUTHWASH
10.0000 mL | Freq: Three times a day (TID) | ORAL | Status: DC
  Administered 2016-02-24 – 2016-03-02 (×8): 10 mL via ORAL
  Filled 2016-02-24 (×33): qty 10

## 2016-02-24 MED ORDER — BISACODYL 10 MG RE SUPP
10.0000 mg | Freq: Once | RECTAL | Status: AC
  Administered 2016-02-24: 10 mg via RECTAL
  Filled 2016-02-24: qty 1

## 2016-02-24 MED ORDER — DEXAMETHASONE SODIUM PHOSPHATE 4 MG/ML IJ SOLN
10.0000 mg | Freq: Four times a day (QID) | INTRAMUSCULAR | Status: DC
  Administered 2016-02-24 – 2016-02-29 (×19): 10 mg via INTRAVENOUS
  Filled 2016-02-24 (×19): qty 3

## 2016-02-24 MED ORDER — PANTOPRAZOLE SODIUM 40 MG IV SOLR
40.0000 mg | Freq: Two times a day (BID) | INTRAVENOUS | Status: DC
  Administered 2016-02-24 – 2016-02-28 (×10): 40 mg via INTRAVENOUS
  Filled 2016-02-24 (×11): qty 40

## 2016-02-24 NOTE — Progress Notes (Addendum)
[facility] Progress Note   Kristin Hoffman  [id number]  DOB: May 04, 1966  DOA: 02/22/2016 PCP: Drake Leach, MD  Brief narrative: Kristin Hoffman is a 50 y.o. female with metastatic breast cancer, ventricular thrombus, pancytopenia presents for slurred speech, left facial droop and weakness on the left side. Symptoms started about 5 days ago and haven't progressed. Initially only her speech was slurred and there was a facial droop now she is weak on the left side and having difficulty ambulating. Imaging in the ER reveals brain metastases with impending herniation. She will start radiation treatments today.    Subjective: Vomiting this AM after apple juice.   Assessment/Plan: Principal Problem:   Brain metastasis  -right hemispheric with left facial, arm/ leg weakness with midline shift and severe vasogenic edema- symptoms improving with Decadron - evaluated by Dr Idell Pickles have spoken with him- she will receive daily treatments for now- first treatment this afternoon at 5 pm -spoke with  Dr Trenton Gammon- recommends to drop Decadron back from 10 mg QID to 4 mg QID can continue at this dose on d/c- cont IV for now due to vomiting  Active Problems: Breast cancer metastasized to multiple sites - pain -Outpatient management has been by Dr Jana Hakim-  on-call oncology Dr Marin Olp seeing her here- appreciate consult - cont home pain regimen- she declined her routine Methadone this AM  Nausea/ Vomiting - resolved- possibly due to brain metastasis- PRN Zofran - cont slow IVF - Constipation may be causing this- unable to take the Lactulose ordered by Dr Marin Olp- will change to suppository today and will follow if to see if having a BM helps vomiting resolve  Weight loss/ Malnutrition  - cont Prostat as ordered by nutritionist  Mouth sores - magic mouthwash  Leukopenia  - counts quite low this AM at 1.1-- don't need differential-will get diff for tomorrow- follow for fevers- place on  neutropenic precautions -   Dr Marin Olp recommends daily Granix for now at 300 mg   Thrombocytopenia  -Platelets 24 in setting of full dose Lovenox- given a platelet infusion on admission bringing platelets up to 35 but down to 24 again today- discussed with Oncology- hold off transfusions for now  -I have explained to the patient and the family the high risk of bleeding   Ventricular mural thrombus - Dr Jana Hakim last recommended to continue Lovenox despite her low platelets as the benefit outweighed the risk- Dr Marin Olp agrees with this plan -Continue Lovenox (also recommended by cardiology after MRI performed a few days ago)  NOTE: Dr Marin Olp has started Fluconazole on 3/14   Antibiotics: Anti-infectives    Start     Dose/Rate Route Frequency Ordered Stop   02/23/16 1000  fluconazole (DIFLUCAN) tablet 100 mg     100 mg Oral Daily 02/23/16 0722     02/22/16 1800  valACYclovir (VALTREX) tablet 1,000 mg     1,000 mg Oral Daily 02/22/16 1652       Code Status: -she states she wants to be a full code and if afterwards she is not able to be functional without life support, she would like her family to remove her from life support    Code Status Orders        Start     Ordered   02/22/16 1728  Full code   Continuous     02/22/16 1728    Code Status History    Date Active Date Inactive Code Status Order ID Comments User Context  05/29/2015  9:51 PM 05/30/2015  5:54 PM Full Code [id number]  Toy Baker, MD Inpatient   12/17/2014  2:39 PM 12/18/2014  3:42 AM Full Code [id number]  Azzie Roup, MD [place]   11/08/2012  7:19 PM 11/12/2012  9:42 PM Full Code [id number]  Kathalene Frames, MD ED     Family Communication: husband Disposition Plan: home when she feels better- can come for radiation treatments from home DVT prophylaxis: Lovenox Consultants: Onc- medical and radiation, Neurosurgery Procedures:     Objective: Filed Weights   02/22/16 1043  Weight: 73.936 kg (163 lb)     Intake/Output Summary (Last 24 hours) at 02/24/16 0949 Last data filed at 02/24/16 0600  Gross per 24 hour  Intake 2936.67 ml  Output      0 ml  Net 2936.67 ml     Vitals Filed Vitals:   02/23/16 1918 02/23/16 2232 02/24/16 0511 02/24/16 0642  BP:  144/95 159/111 131/96  Pulse:  107 112 107  Temp: 97 F (36.1 C) 98.4 F (36.9 C) 98.2 F (36.8 C)   TempSrc: Axillary Oral Oral   Resp:  16 16   Height:      Weight:      SpO2:  95% 96%     Exam:  General:  Pt is alert, not in acute distress  HEENT: No icterus, No thrush, oral mucosa moist- pupils dilated  Cardiovascular: regular rate and rhythm, S1/S2 No murmur  Respiratory: clear to auscultation bilaterally   Abdomen: Vomiting-  Soft, +Bowel sounds, non tender, non distended, no guarding  MSK: No cyanosis or clubbing- no pedal edema  Neuro: facial droop resolved, strength still 4/5 in left arm and leg   Data Reviewed: Basic Metabolic Panel:  Recent Labs Lab 02/22/16 1155 02/23/16 0615 02/24/16 0430  NA 140 142 142  K 3.4* 3.3* 3.8  CL 104 98* 101  CO2 32 35* 35*  GLUCOSE 129* 186* 145*  BUN 12 14 14   CREATININE 0.38* 0.45 0.42*  CALCIUM 8.9 9.5 9.3  MG  --  1.7  --    Liver Function Tests:  Recent Labs Lab 02/22/16 1155  AST 39  ALT 27  ALKPHOS 78  BILITOT 1.1  PROT 5.1*  ALBUMIN 2.9*   No results for input(s): LIPASE, AMYLASE in the last 168 hours.  Recent Labs Lab 02/22/16 1155  AMMONIA 42*   CBC:  Recent Labs Lab 02/22/16 1155 02/23/16 0615 02/24/16 0920  WBC 2.0* 2.3* 1.1*  NEUTROABS 1.7  --   --   HGB 10.6* 10.8* 9.3*  HCT 32.7* 33.2* 28.5*  MCV 114.3* 112.9* 114.0*  PLT 24* 35* 24*   Cardiac Enzymes: No results for input(s): CKTOTAL, CKMB, CKMBINDEX, TROPONINI in the last 168 hours. BNP (last 3 results)  Recent Labs  05/29/15 1521  BNP 29.0    ProBNP (last 3 results) No results for input(s): PROBNP in the last 8760 hours.  CBG: No results for input(s):  GLUCAP in the last 168 hours.  Recent Results (from the past 240 hour(s))  Urine culture     Status: None   Collection Time: 02/22/16  3:13 PM  Result Value Ref Range Status   Specimen Description URINE, CLEAN CATCH  Final   Special Requests NONE  Final   Culture   Final    6,000 COLONIES/mL INSIGNIFICANT GROWTH Performed at [facility]    Report Status 02/23/2016 FINAL  Final     Studies: Dg Chest 2 View  02/22/2016  CLINICAL DATA:  Chest pain with shortness of breath for several days. History of metastatic breast carcinoma EXAM: CHEST  2 VIEW COMPARISON:  Chest radiograph June 05, 2015; chest CT February 01, 2016 FINDINGS: There is a pulmonary nodular opacity in the left upper lobe near the apex measuring 1.0 x 0.8 cm, essentially stable compared to prior CT examination. The previously noted right middle lobe lesion is not well seen on this current radiographic examination. It is vaguely appreciable on the lateral view where it measures 2.3 x 1.7 cm. There is no appreciable edema or consolidation. The heart size and pulmonary vascularity are within normal limits. There is equivocal hilar adenopathy on the right. Port-A-Cath tip is at the cavoatrial junction. No bone lesions are evident. There are surgical clips in the left breast and left axillary regions. IMPRESSION: Pulmonary nodular lesions in the left upper lobe and right middle lobe, also noted on prior CT. No frank edema or consolidation. No change in cardiac silhouette. There is questionable right hilar adenopathy currently. No pneumothorax. Postoperative change on left. Electronically Signed   By: Lowella Grip III M.D.   On: 02/22/2016 12:15   Ct Head Wo Contrast  02/22/2016  CLINICAL DATA:  Altered mental status with increased fatigue and somnolence. Breast carcinoma, currently receiving chemotherapy EXAM: CT HEAD WITHOUT CONTRAST TECHNIQUE: Contiguous axial images were obtained from the base of the skull through the  vertex without intravenous contrast. COMPARISON:  September 28, 2015 FINDINGS: There is extensive vasogenic edema throughout much of the right frontal and temporal lobes as well as involving the anterior right occipital lobe. This extensive vasogenic edema extends throughout much of the right basal ganglia region. It effaces a portion of the right lateral ventricle with patient of the midline structures toward the left by 7 mm. Third ventricle is mildly effaced and shifted toward the left. The fourth ventricle is midline and normal in appearance. There is no hemorrhage. There are no subdural or epidural fluid collections. No acute infarct is evident. The bony calvarium appears intact. The mastoid air cells are clear. Visualized orbits appear symmetric bilaterally. IMPRESSION: Extensive vasogenic edema on the right causing effacement of the right lateral ventricle and to a lesser extent the third ventricle and midline shift of 7 mm toward the left. This finding most likely is due to underlying mass or masses on the right. No hemorrhage seen. No acute infarct evident. Advise brain MRI pre and post-contrast to further evaluate this area of presumed neoplastic involvement in the right side of the brain. Critical Value/emergent results were called by telephone at the time of interpretation on 02/22/2016 at 12:20 pm to Dr. Zenovia Jarred , who verbally acknowledged these results. Electronically Signed   By: Lowella Grip III M.D.   On: 02/22/2016 12:21   Mr Jeri Cos [id number] Contrast  02/22/2016  CLINICAL DATA:  Abnormal head CT with vasogenic edema on the right. Metastatic breast cancer. EXAM: MRI HEAD WITHOUT AND WITH CONTRAST TECHNIQUE: Multiplanar, multiecho pulse sequences of the brain and surrounding structures were obtained without and with intravenous contrast. CONTRAST:  64mL MULTIHANCE GADOBENATE DIMEGLUMINE 529 MG/ML IV SOLN COMPARISON:  Head CT earlier same day. MRI 08/20/2013. CT 09/28/2015 FINDINGS: No  abnormality affects the brainstem or cerebellum. 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 In the right hemisphere, there is an intra-axial mass measuring 3 cm in diameter centered at the deep insula. There is extensive vasogenic edema emanating throughout the right  temporal lobe an the right frontoparietal region. There is a second region of intra-axial enhancing mass in the posterior temporal lobe on the right with vasogenic edema. No other foci are identified. There are micro hemorrhagic foci within these tumors. The differential diagnosis in this case is that of multifocal glioblastoma versus metastatic disease. There is mass effect with maximal right-to-left shift measuring 1 cm. Threatening uncal herniation on the right. No evidence of ventricular trapping. No extra-axial fluid collection. IMPRESSION: Two enhancing masses within the right hemisphere, the larger centered in the deep insula measuring 3 cm in diameter with extensive surrounding vasogenic edema. The smaller is in the posterior temporal lobe measuring approximately 14 mm in size, also with surrounding edema. Mass effect with right-to-left shift of 1 cm. Micro hemorrhages present in both of these lesions. In a patient with a history of metastatic breast cancer, this could certainly represent that process. However, multifocal glioblastoma could also present with this appearance. The processes rapidly progressive, not diagnosable in October of 2016. I am in the process of calling this report to the clinical service. Electronically Signed   By: Nelson Chimes M.D.   On: 02/22/2016 14:53    Scheduled Meds:  Scheduled Meds: . sodium chloride   Intravenous Once  . acidophilus  1 capsule Oral q morning - 10a  . dexamethasone  4 mg Intravenous 4 times per day  . docusate sodium  100 mg Oral BID  . enoxaparin  100 mg Subcutaneous Q24H  . escitalopram  20 mg Oral Daily  . feeding supplement (PRO-STAT SUGAR  FREE 64)  30 mL Oral TID  . fluconazole  100 mg Oral Daily  . lactulose  20 g Oral TID  . methadone  2.5 mg Oral 3 times per day  . methylphenidate  5 mg Oral BID WC  . montelukast  10 mg Oral QHS  . multivitamin with minerals  1 tablet Oral q morning - 10a  . pantoprazole (PROTONIX) IV  40 mg Intravenous Q12H  . sodium chloride flush  3 mL Intravenous Q12H  . valACYclovir  1,000 mg Oral Daily   Continuous Infusions: . sodium chloride 100 mL/hr at 02/24/16 0201    Time spent on care of this patient: 35 min   [place], MD 02/24/2016, 9:49 AM  LOS: 2 days   [facility] Office  [phone] Pager - Text Page per [email or web address] If 7PM-7AM, please contact night-coverage [email or web address]

## 2016-02-24 NOTE — Op Note (Signed)
  Name: Kristin Hoffman  MRN: [id number]  Date: 02/24/2016   DOB: May 28, 1966  Stereotactic Radiosurgery Operative Note  PRE-OPERATIVE DIAGNOSIS:  Multiple Brain Metastases  POST-OPERATIVE DIAGNOSIS:  Multiple Brain Metastases  PROCEDURE:  Stereotactic Radiosurgery  SURGEON:  Charlie Pitter, MD  NARRATIVE: The patient underwent a radiation treatment planning session in the radiation oncology simulation suite under the care of the radiation oncology physician and physicist.  I participated closely in the radiation treatment planning afterwards. The patient underwent planning CT which was fused to 3T high resolution MRI with 1 mm axial slices.  These images were fused on the planning system.  We contoured the gross target volumes and subsequently expanded this to yield the Planning Target Volume. I actively participated in the planning process.  I helped to define and review the target contours and also the contours of the optic pathway, eyes, brainstem and selected nearby organs at risk.  All the dose constraints for critical structures were reviewed and compared to AAPM Task Group 101.  The prescription dose conformity was reviewed.  I approved the plan electronically.    Accordingly, [name] was brought to the TrueBeam stereotactic radiation treatment linac and placed in the custom immobilization mask.  The patient was aligned according to the IR fiducial markers with BrainLab Exactrac, then orthogonal x-rays were used in ExacTrac with the 6DOF robotic table and the shifts were made to align the patient  [name] received stereotactic radiosurgery uneventfully.    Lesions treated:  2   Complex lesions treated:  1 (>3.5 cm, <23mm of optic path, or within the brainstem)   The detailed description of the procedure is recorded in the radiation oncology procedure note.  I was present for the duration of the procedure.  DISPOSITION:  Following delivery, the patient was transported to nursing in  stable condition and monitored for possible acute effects to be discharged to home in stable condition with follow-up in one month.  Charlie Pitter, MD 02/24/2016 5:32 PM

## 2016-02-24 NOTE — Clinical Documentation Improvement (Signed)
Hospitalist  Can the diagnosis of Malnutrition be further specified?   Document Severity - Severe(third degree), Moderate (second degree), Mild (first degree)  Other condition  Unable to clinically determine  Document any associated diagnoses/conditions   Supporting Information:  Per 3/14 Registered Dietician evaluation: Severe malnutrition in the context of chronic illness, moderate depletion of body fat, moderate depletion of muscle mass; patient has lost 17 pounds, a 9% decrease in weight x 2 months.   Please exercise your independent, professional judgment when responding. A specific answer is not anticipated or expected.   Thank You, [place] [phone]

## 2016-02-24 NOTE — Progress Notes (Signed)
CRITICAL VALUE ALERT  Critical value received:  WBC 1.1, Platelet 24  Date of notification:  02/24/16  Time of notification:  0930  Critical value read back:Yes   Nurse who received alert:  Elwin Sleight RN   MD notified (1st page):  Rizwan   Time of first page:  0935  MD notified (2nd page):  Time of second page:  Responding MD: Wynelle Cleveland   Time MD responded:  [place], RN

## 2016-02-24 NOTE — Progress Notes (Signed)
Notified MD Rizwan of pts HR going into the 140s-150s when ambulating to bathroom. Will continue to monitor.  Rosine Beat, RN

## 2016-02-24 NOTE — Progress Notes (Signed)
Kristin Hoffman appears more lethargic this morning. I am not sure as to why this would be.  I am also not sure as to why she is now on Keppra. She's had no seizures. There is no indication for seizure medication from my point of view. As such, I probably would stop this.  I think she is on quite a few medications. It would be nice to try to DC some of these area and  She still has not had a bowel movement. She is on lactulose. Much sure how well she is going to tolerate this.  She went down for radiation yesterday. The planning is starting. It sounds that they will start the actual stereotactic procedure later on this week.  It does not sound like she's eating all that much. Some of this might be from her being constipated.  Her labs that were done today show her sodium to be 142. Potassium 3.8. Her calcium is 9.3. There is no CBC back. She will needs to have her labs checked daily for right now.  Her physical exam is pretty much unchanged. Her vital signs all are pretty stable. Her pulse is 107. Her blood pressure is 131/96. Her lungs sound clear. Cardiac exam is tachycardic but regular. Abdomen is soft. Bowel sounds are slightly decreased. She has had no abdominal fluid. There is no palpable liver or spleen tip. Extremities shows some trace edema possibly. She seems to move her extremities okay although the might be some slight weakness over on the left side.  Kristin Hoffman has metastatic breast cancer. She now has brain metastases. She has 2 brain metastases. Hopefully, these will be oh to be treated with stereotactic radiosurgery and help improve her quality of life.  Her systemic therapy is on hold for right now. I'll think this is that much of an issue not being a give her the Navelbine.  Hopefully, maybe some dosage adjustments in medication adjustments will help her mental status and might be a get her to be a little more active. I think she probably will need some physical therapy but I don't  think that she is able to do anything right now.  As always, the staff on [address] is doing a fantastic job taking care of her.  Pete E.  [place] 6:8

## 2016-02-25 ENCOUNTER — Other Ambulatory Visit: Payer: Self-pay | Admitting: Nurse Practitioner

## 2016-02-25 LAB — CBC WITH DIFFERENTIAL/PLATELET
BASOS ABS: 0 10*3/uL (ref 0.0–0.1)
Basophils Relative: 0 %
EOS ABS: 0 10*3/uL (ref 0.0–0.7)
Eosinophils Relative: 0 %
HCT: 28 % — ABNORMAL LOW (ref 36.0–46.0)
HEMOGLOBIN: 9.1 g/dL — AB (ref 12.0–15.0)
Lymphocytes Relative: 11 %
Lymphs Abs: 0.2 10*3/uL — ABNORMAL LOW (ref 0.7–4.0)
MCH: 36.8 pg — ABNORMAL HIGH (ref 26.0–34.0)
MCHC: 32.5 g/dL (ref 30.0–36.0)
MCV: 113.4 fL — ABNORMAL HIGH (ref 78.0–100.0)
MONO ABS: 0.1 10*3/uL (ref 0.1–1.0)
Monocytes Relative: 5 %
NEUTROS ABS: 1.2 10*3/uL — AB (ref 1.7–7.7)
Neutrophils Relative %: 84 %
PLATELETS: 25 10*3/uL — AB (ref 150–400)
RBC: 2.47 MIL/uL — AB (ref 3.87–5.11)
RDW: 21.6 % — ABNORMAL HIGH (ref 11.5–15.5)
WBC: 1.5 10*3/uL — ABNORMAL LOW (ref 4.0–10.5)

## 2016-02-25 LAB — BASIC METABOLIC PANEL
ANION GAP: 6 (ref 5–15)
BUN: 14 mg/dL (ref 6–20)
CALCIUM: 8.7 mg/dL — AB (ref 8.9–10.3)
CO2: 31 mmol/L (ref 22–32)
Chloride: 101 mmol/L (ref 101–111)
Creatinine, Ser: 0.48 mg/dL (ref 0.44–1.00)
GFR calc Af Amer: >60 mL/min (ref >60)
GLUCOSE: 140 mg/dL — AB (ref 65–99)
Potassium: 3.4 mmol/L — ABNORMAL LOW (ref 3.5–5.1)
Sodium: 138 mmol/L (ref 135–145)

## 2016-02-25 LAB — CBC
HCT: 28.5 % — ABNORMAL LOW (ref 36.0–46.0)
Hemoglobin: 9.3 g/dL — ABNORMAL LOW (ref 12.0–15.0)
MCH: 37.2 pg — AB (ref 26.0–34.0)
MCHC: 32.6 g/dL (ref 30.0–36.0)
MCV: 114 fL — ABNORMAL HIGH (ref 78.0–100.0)
PLATELETS: 24 10*3/uL — AB (ref 150–400)
RBC: 2.5 MIL/uL — AB (ref 3.87–5.11)
RDW: 21.7 % — ABNORMAL HIGH (ref 11.5–15.5)
WBC: 1.1 10*3/uL — AB (ref 4.0–10.5)

## 2016-02-25 MED ORDER — METOCLOPRAMIDE HCL 5 MG/ML IJ SOLN
20.0000 mg | Freq: Three times a day (TID) | INTRAVENOUS | Status: DC
  Administered 2016-02-25 – 2016-02-26 (×3): 20 mg via INTRAVENOUS
  Filled 2016-02-25 (×4): qty 4

## 2016-02-25 NOTE — Progress Notes (Signed)
[facility] Progress Note   Kristin Hoffman  [id number]  DOB: October 08, 1966  DOA: 02/22/2016 PCP: Drake Leach, MD  Brief narrative: Kristin Hoffman is a 50 y.o. female with metastatic breast cancer, ventricular thrombus, pancytopenia presents for slurred speech, left facial droop and weakness on the left side. Symptoms started about 5 days ago and haven't progressed. Initially only her speech was slurred and there was a facial droop now she is weak on the left side and having difficulty ambulating. Imaging in the ER reveals brain metastases with impending herniation. She will start radiation treatments today.    Subjective: Vomiting this AM after apple juice.   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 -She presented with left facial, arm/ leg weakness with midline shift and severe vasogenic edema- symptoms improving with Decadron -CT scan of brain without contrast performed on 02/22/2016 showed extensive vasogenic edema on the right causing effacement of the right lateral ventricle with 7 mm midline shift -MRI of brain performed on 02/22/2016 showing 2 enhancing masses within the right atmosphere with extensive surrounding vasogenic edema. Micro-hemorrhages present in both lesions. -Evaluated by Dr Tammi Klippel, she was started on daily treatments for now- first treatment this afternoon at 5 pm -Dr Trenton Gammon consulted -She underwent stereotactic radiosurgery performed on 02/24/2016.  Active Problems: Breast cancer metastasized to multiple sites - pain -Outpatient management has been by Dr Jana Hakim-  on-call oncology Dr Marin Olp   Nausea/ Vomiting -Likely due to brain metastasis- PRN Zofran - cont slow IVF - Constipation may be causing this- unable to take the Lactulose ordered by Dr  Marin Olp- will change to suppository today and will follow if to see if having a BM helps vomiting resolve  Weight loss/ Malnutrition  - cont Prostat as ordered by nutritionist  Mouth sores - magic mouthwash  Leukopenia  -A.m. lab work showing white count of 1.5 with neutrophil counts of 1.2. -  Dr Marin Olp recommends daily Granix for now at 300 mg   Thrombocytopenia  -Lab showing a platelet count of 25,000.  -No evidence of active bleeding   Ventricular mural thrombus - Dr Jana Hakim last recommended to continue Lovenox despite her low platelets as the benefit outweighed the risk- Dr Marin Olp agrees with this plan -Continue Lovenox (also recommended by cardiology after MRI performed a few days ago)  NOTE: Dr Marin Olp has started Fluconazole on 3/14   Antibiotics: Anti-infectives    Start     Dose/Rate Route Frequency Ordered Stop   02/23/16 1000  fluconazole (DIFLUCAN) tablet 100 mg     100 mg Oral Daily 02/23/16 0722     02/22/16 1800  valACYclovir (VALTREX) tablet 1,000 mg     1,000 mg Oral Daily 02/22/16 1652       Code Status: -she states she wants to be a full code and if afterwards she is not able to be functional without life support, she would like her family to remove her from life support    Code Status Orders        Start     Ordered   02/22/16 1728  Full code   Continuous     02/22/16 1728    Code Status History    Date Active Date Inactive Code Status Order ID Comments User Context   05/29/2015  9:51 PM 05/30/2015  5:54  PM Full Code [id number]  Toy Baker, MD Inpatient   12/17/2014  2:39 PM 12/18/2014  3:42 AM Full Code [id number]  Azzie Roup, MD [place]   11/08/2012  7:19 PM 11/12/2012  9:42 PM Full Code [id number]  Kathalene Frames, MD ED     Family Communication: husband Disposition Plan: home when she feels better- can come for radiation treatments from home DVT prophylaxis: Lovenox Consultants: Onc- medical and radiation, Neurosurgery Procedures:      Objective: Filed Weights   02/22/16 1043  Weight: 73.936 kg (163 lb)    Intake/Output Summary (Last 24 hours) at 02/25/16 0856 Last data filed at 02/25/16 0700  Gross per 24 hour  Intake   2500 ml  Output      0 ml  Net   2500 ml     Vitals Filed Vitals:   02/24/16 0642 02/24/16 1341 02/24/16 2141 02/25/16 0443  BP: 131/96 145/88 139/79 130/87  Pulse: 107 109 110 107  Temp:  98 F (36.7 C) 99 F (37.2 C) 97.7 F (36.5 C)  TempSrc:  Oral Oral Oral  Resp:  18 18 18   Height:      Weight:      SpO2:  96% 96% 100%    Exam:  General:  Patient appears lethargic this morning, difficult to arouse  HEENT: No icterus, No thrush, oral mucosa moist- pupils dilated  Cardiovascular: regular rate and rhythm, S1/S2 No murmur  Respiratory: clear to auscultation bilaterally   Abdomen: Vomiting-  Soft, +Bowel sounds, non tender, non distended, no guarding  MSK: No cyanosis or clubbing- no pedal edema  Neuro: facial droop resolved, strength still 4/5 in left arm and leg   Data Reviewed: Basic Metabolic Panel:  Recent Labs Lab 02/22/16 1155 02/23/16 0615 02/24/16 0430 02/25/16 0410  NA 140 142 142 138  K 3.4* 3.3* 3.8 3.4*  CL 104 98* 101 101  CO2 32 35* 35* 31  GLUCOSE 129* 186* 145* 140*  BUN 12 14 14 14   CREATININE 0.38* 0.45 0.42* 0.48  CALCIUM 8.9 9.5 9.3 8.7*  MG  --  1.7  --   --    Liver Function Tests:  Recent Labs Lab 02/22/16 1155  AST 39  ALT 27  ALKPHOS 78  BILITOT 1.1  PROT 5.1*  ALBUMIN 2.9*   No results for input(s): LIPASE, AMYLASE in the last 168 hours.  Recent Labs Lab 02/22/16 1155  AMMONIA 42*   CBC:  Recent Labs Lab 02/22/16 1155 02/23/16 0615 02/24/16 0920 02/25/16 0410  WBC 2.0* 2.3* 1.1* 1.5*  NEUTROABS 1.7  --   --  1.2*  HGB 10.6* 10.8* 9.3* 9.1*  HCT 32.7* 33.2* 28.5* 28.0*  MCV 114.3* 112.9* 114.0* 113.4*  PLT 24* 35* 24* 25*   Cardiac Enzymes: No results for input(s): CKTOTAL, CKMB, CKMBINDEX,  TROPONINI in the last 168 hours. BNP (last 3 results)  Recent Labs  05/29/15 1521  BNP 29.0    ProBNP (last 3 results) No results for input(s): PROBNP in the last 8760 hours.  CBG: No results for input(s): GLUCAP in the last 168 hours.  Recent Results (from the past 240 hour(s))  Urine culture     Status: None   Collection Time: 02/22/16  3:13 PM  Result Value Ref Range Status   Specimen Description URINE, CLEAN CATCH  Final   Special Requests NONE  Final   Culture   Final    6,000 COLONIES/mL INSIGNIFICANT GROWTH Performed at [facility]  Report Status 02/23/2016 FINAL  Final     Studies: No results found.  Scheduled Meds:  Scheduled Meds: . sodium chloride   Intravenous Once  . acidophilus  1 capsule Oral q morning - 10a  . dexamethasone  10 mg Intravenous 4 times per day  . docusate sodium  100 mg Oral BID  . enoxaparin  100 mg Subcutaneous Q24H  . escitalopram  20 mg Oral Daily  . feeding supplement (PRO-STAT SUGAR FREE 64)  30 mL Oral TID  . fluconazole  100 mg Oral Daily  . magic mouthwash  10 mL Oral TID  . methadone  2.5 mg Oral 3 times per day  . methylphenidate  5 mg Oral BID WC  . montelukast  10 mg Oral QHS  . multivitamin with minerals  1 tablet Oral q morning - 10a  . pantoprazole (PROTONIX) IV  40 mg Intravenous Q12H  . sodium chloride flush  3 mL Intravenous Q12H  . Tbo-filgastrim (GRANIX) SQ  300 mcg Subcutaneous q1800  . valACYclovir  1,000 mg Oral Daily   Continuous Infusions: . sodium chloride 100 mL/hr at 02/24/16 2258    Time spent on care of this patient: 35 min   Kelvin Cellar, MD 02/25/2016, 8:56 AM  LOS: 3 days   [facility] Office  [phone] Pager - Text Page per [email or web address] If 7PM-7AM, please contact night-coverage [email or web address]

## 2016-02-26 ENCOUNTER — Ambulatory Visit
Admit: 2016-02-26 | Discharge: 2016-02-26 | Disposition: A | Discharge status: Home / Self Care | Payer: 59 | Attending: Radiation Oncology | Admitting: Radiation Oncology

## 2016-02-26 DIAGNOSIS — C7931 Secondary malignant neoplasm of brain: Secondary | ICD-10-CM

## 2016-02-26 DIAGNOSIS — D709 Neutropenia, unspecified: Secondary | ICD-10-CM

## 2016-02-26 DIAGNOSIS — R531 Weakness: Secondary | ICD-10-CM

## 2016-02-26 DIAGNOSIS — C7989 Secondary malignant neoplasm of other specified sites: Secondary | ICD-10-CM

## 2016-02-26 DIAGNOSIS — R109 Unspecified abdominal pain: Secondary | ICD-10-CM

## 2016-02-26 DIAGNOSIS — R4781 Slurred speech: Secondary | ICD-10-CM

## 2016-02-26 DIAGNOSIS — R1110 Vomiting, unspecified: Secondary | ICD-10-CM

## 2016-02-26 DIAGNOSIS — C50919 Malignant neoplasm of unspecified site of unspecified female breast: Secondary | ICD-10-CM

## 2016-02-26 LAB — BASIC METABOLIC PANEL
Anion gap: 7 (ref 5–15)
BUN: 17 mg/dL (ref 6–20)
CHLORIDE: 107 mmol/L (ref 101–111)
CO2: 27 mmol/L (ref 22–32)
CREATININE: 0.6 mg/dL (ref 0.44–1.00)
Calcium: 8.5 mg/dL — ABNORMAL LOW (ref 8.9–10.3)
GFR calc Af Amer: >60 mL/min (ref >60)
GFR calc non Af Amer: >60 mL/min (ref >60)
GLUCOSE: 174 mg/dL — AB (ref 65–99)
POTASSIUM: 3.4 mmol/L — AB (ref 3.5–5.1)
SODIUM: 141 mmol/L (ref 135–145)

## 2016-02-26 LAB — CBC
HEMATOCRIT: 26.5 % — AB (ref 36.0–46.0)
HEMOGLOBIN: 8.9 g/dL — AB (ref 12.0–15.0)
MCH: 36.3 pg — AB (ref 26.0–34.0)
MCHC: 33.6 g/dL (ref 30.0–36.0)
MCV: 108.2 fL — AB (ref 78.0–100.0)
Platelets: 29 10*3/uL — CL (ref 150–400)
RBC: 2.45 MIL/uL — AB (ref 3.87–5.11)
RDW: 21.8 % — ABNORMAL HIGH (ref 11.5–15.5)
WBC: 2.1 10*3/uL — ABNORMAL LOW (ref 4.0–10.5)

## 2016-02-26 MED ORDER — ENSURE ENLIVE PO LIQD
237.0000 mL | Freq: Once | ORAL | Status: AC
  Administered 2016-02-26: 237 mL via ORAL

## 2016-02-26 MED ORDER — ENSURE ENLIVE PO LIQD
237.0000 mL | Freq: Two times a day (BID) | ORAL | Status: DC | PRN
Start: 2016-02-26 — End: 2016-03-05

## 2016-02-26 MED ORDER — METOCLOPRAMIDE HCL 5 MG/ML IJ SOLN
20.0000 mg | Freq: Four times a day (QID) | INTRAVENOUS | Status: DC
  Administered 2016-02-26 – 2016-02-29 (×12): 20 mg via INTRAVENOUS
  Filled 2016-02-26 (×13): qty 4

## 2016-02-26 MED ORDER — SODIUM CHLORIDE 0.9 % IV SOLN
150.0000 mg | Freq: Once | INTRAVENOUS | Status: AC
  Administered 2016-02-26: 150 mg via INTRAVENOUS
  Filled 2016-02-26: qty 5

## 2016-02-26 NOTE — Progress Notes (Signed)
  Radiation Oncology         [phone] ________________________________  Stereotactic Treatment Procedure Note  Name: Kristin Hoffman MRN: [id number]  Date: 02/26/2016  DOB: [birth year]  SPECIAL TREATMENT PROCEDURE    ICD-9-CM ICD-10-CM   1. Brain metastasis ([place]) 198.3 C79.31    Fraction 2/3  3D TREATMENT PLANNING AND DOSIMETRY:  The patient's radiation plan was reviewed and approved by neurosurgery and radiation oncology prior to treatment.  It showed 3-dimensional radiation distributions overlaid onto the planning CT/MRI image set.  The [facility] for the target structures as well as the organs at risk were reviewed. The documentation of the 3D plan and dosimetry are filed in the radiation oncology EMR.  NARRATIVE:  Kristin Hoffman was brought to the TrueBeam stereotactic radiation treatment machine and placed supine on the CT couch. The head frame was applied, and the patient was set up for stereotactic radiosurgery.  Neurosurgery was present for the set-up and delivery  SIMULATION VERIFICATION:  In the couch zero-angle position, the patient underwent Exactrac imaging using the Brainlab system with orthogonal KV images.  These were carefully aligned and repeated to confirm treatment position for each of the isocenters.  The Exactrac snap film verification was repeated at each couch angle.  PROCEDURE: [name] received stereotactic radiosurgery to the following targets: Rt Basal Ganglia 3 cm target was treated using 4 VMAT Arcs to a prescription dose of 8 Gy to be repeated 3 times for total of 24 Gy.  ExacTrac registration was performed for each couch angle.  The 100% isodose line was prescribed.  6 MV X-rays were delivered in the flattening filter free beam mode.   STEREOTACTIC TREATMENT MANAGEMENT:  Following delivery, the patient was transported to nursing in stable condition and monitored for possible acute effects.  Vital signs were recorded There were no vitals taken for this visit..  The patient tolerated treatment without significant acute effects, and was discharged to home in stable condition.    PLAN: Complete 3 fractions and then follow-up in one month.  ________________________________  Sheral Apley. Tammi Klippel, M.D.    This document serves as a record of services personally performed by [name], MD. It was created on his behalf by Lendon Collar, a trained medical scribe. The creation of this record is based on the scribe's personal observations and the provider's statements to them. This document has been checked and approved by the attending provider.

## 2016-02-26 NOTE — Progress Notes (Signed)
[facility] Progress Note   Kristin Hoffman  [id number]  DOB: 04/26/1966  DOA: 02/22/2016 PCP: Drake Leach, MD  Brief narrative: Kristin Hoffman is a 50 y.o. female with metastatic breast cancer, ventricular thrombus, pancytopenia presents for slurred speech, left facial droop and weakness on the left side. Symptoms started about 5 days ago and haven't progressed. Initially only her speech was slurred and there was a facial droop now Kristin Hoffman is weak on the left side and having difficulty ambulating. Imaging in the ER reveals brain metastases with impending herniation. Kristin Hoffman will start radiation treatments today.    Subjective: He is more awake, alert, interactive, reports ongoing nausea and poor oral intake.  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 -Kristin Hoffman presented with left facial, arm/ leg weakness with midline shift and severe vasogenic edema- symptoms improving with Decadron -CT scan of brain without contrast performed on 02/22/2016 showed extensive vasogenic edema on the right causing effacement of the right lateral ventricle with 7 mm midline shift -MRI of brain performed on 02/22/2016 showing 2 enhancing masses within the right atmosphere with extensive surrounding vasogenic edema. Micro-hemorrhages present in both lesions. -Evaluated by Dr Tammi Klippel, Kristin Hoffman was started on daily treatments for now- first treatment this afternoon at 5 pm -Dr Trenton Gammon consulted -Kristin Hoffman underwent stereotactic radiosurgery performed on 02/24/2016. -On 02/26/2016 Kristin Hoffman showed significant clinical improvement appear more awake and alert, interactive.  Active Problems: Breast cancer metastasized to multiple sites - pain -Outpatient management has been by Dr Jana Hakim-  on-call oncology Dr Marin Olp  -Pancytopenia  secondary to chemotherapy. -A.m. labs on 02/25/2049 showing an upward trend in her white count to 2100, previously 1100 on 02/24/2016. -Medical oncology following, continue Granix 300 g subcutaneous daily.  Nausea/ Vomiting -Likely due to brain metastasis- PRN Zofran - cont slow IVF -Improving although continues to have poor by mouth intake.  Weight loss/ Malnutrition  - cont Prostat as ordered by nutritionist  Mouth sores - magic mouthwash  Leukopenia  -A.m. lab work showing white count of 1.5 with neutrophil counts of 1.2. -  Dr Marin Olp recommends daily Granix for now at 300 mg   Thrombocytopenia  -Lab showing a stable platelet count of 29,000 -No evidence of active bleeding   Ventricular mural thrombus - Dr Jana Hakim last recommended to continue Lovenox despite her low platelets as the benefit outweighed the risk- Dr Marin Olp agrees with this plan -Continue Lovenox (also recommended by cardiology after MRI performed a few days ago)  NOTE: Dr Marin Olp has started Fluconazole on 3/14   Antibiotics: Anti-infectives    Start     Dose/Rate Route Frequency Ordered Stop   02/23/16 1000  fluconazole (DIFLUCAN) tablet 100 mg     100 mg Oral Daily 02/23/16 0722     02/22/16 1800  valACYclovir (VALTREX) tablet 1,000 mg     1,000 mg Oral Daily 02/22/16 1652       Code Status: -Kristin Hoffman states Kristin Hoffman wants to be a full code and if afterwards Kristin Hoffman is not able to be functional without life support, Kristin Hoffman would like her family to remove her from life support    Code Status Orders        Start     Ordered   02/22/16 1728  Full code   Continuous     02/22/16 1728  Code Status History    Date Active Date Inactive Code Status Order ID Comments User Context   05/29/2015  9:51 PM 05/30/2015  5:54 PM Full Code [id number]  Toy Baker, MD Inpatient   12/17/2014  2:39 PM 12/18/2014  3:42 AM Full Code [id number]  Azzie Roup, MD [place]   11/08/2012  7:19 PM 11/12/2012  9:42 PM Full Code  [id number]  Kathalene Frames, MD ED     Family Communication: husband Disposition Plan: home when Kristin Hoffman feels better- can come for radiation treatments from home DVT prophylaxis: Lovenox Consultants: Onc- medical and radiation, Neurosurgery Procedures:     Objective: Filed Weights   02/22/16 1043  Weight: 73.936 kg (163 lb)    Intake/Output Summary (Last 24 hours) at 02/26/16 1400 Last data filed at 02/25/16 2300  Gross per 24 hour  Intake   1944 ml  Output      0 ml  Net   1944 ml     Vitals Filed Vitals:   02/24/16 2141 02/25/16 0443 02/25/16 1336 02/25/16 2353  BP: 139/79 130/87 142/95 124/84  Pulse: 110 107 105 111  Temp: 99 F (37.2 C) 97.7 F (36.5 C) 98.4 F (36.9 C) 98.4 F (36.9 C)  TempSrc: Oral Oral Oral Oral  Resp: 18 18  18   Height:      Weight:      SpO2: 96% 100% 95% 96%    Exam:  General:  Kristin Hoffman is more awake and interactive, looks better.  HEENT: No icterus, No thrush, oral mucosa moist- pupils dilated  Cardiovascular: regular rate and rhythm, S1/S2 No murmur  Respiratory: clear to auscultation bilaterally   Abdomen: Vomiting-  Soft, +Bowel sounds, non tender, non distended, no guarding  MSK: No cyanosis or clubbing- no pedal edema  Neuro: facial droop resolved, strength still 4/5 in left arm and leg   Data Reviewed: Basic Metabolic Panel:  Recent Labs Lab 02/22/16 1155 02/23/16 0615 02/24/16 0430 02/25/16 0410 02/26/16 0810  NA 140 142 142 138 141  K 3.4* 3.3* 3.8 3.4* 3.4*  CL 104 98* 101 101 107  CO2 32 35* 35* 31 27  GLUCOSE 129* 186* 145* 140* 174*  BUN 12 14 14 14 17   CREATININE 0.38* 0.45 0.42* 0.48 0.60  CALCIUM 8.9 9.5 9.3 8.7* 8.5*  MG  --  1.7  --   --   --    Liver Function Tests:  Recent Labs Lab 02/22/16 1155  AST 39  ALT 27  ALKPHOS 78  BILITOT 1.1  PROT 5.1*  ALBUMIN 2.9*   No results for input(s): LIPASE, AMYLASE in the last 168 hours.  Recent Labs Lab 02/22/16 1155  AMMONIA 42*   CBC:  Recent  Labs Lab 02/22/16 1155 02/23/16 0615 02/24/16 0920 02/25/16 0410 02/26/16 0810  WBC 2.0* 2.3* 1.1* 1.5* 2.1*  NEUTROABS 1.7  --   --  1.2*  --   HGB 10.6* 10.8* 9.3* 9.1* 8.9*  HCT 32.7* 33.2* 28.5* 28.0* 26.5*  MCV 114.3* 112.9* 114.0* 113.4* 108.2*  PLT 24* 35* 24* 25* 29*   Cardiac Enzymes: No results for input(s): CKTOTAL, CKMB, CKMBINDEX, TROPONINI in the last 168 hours. BNP (last 3 results)  Recent Labs  05/29/15 1521  BNP 29.0    ProBNP (last 3 results) No results for input(s): PROBNP in the last 8760 hours.  CBG: No results for input(s): GLUCAP in the last 168 hours.  Recent Results (from the past 240 hour(s))  Urine culture  Status: None   Collection Time: 02/22/16  3:13 PM  Result Value Ref Range Status   Specimen Description URINE, CLEAN CATCH  Final   Special Requests NONE  Final   Culture   Final    6,000 COLONIES/mL INSIGNIFICANT GROWTH Performed at [facility]    Report Status 02/23/2016 FINAL  Final     Studies: No results found.  Scheduled Meds:  Scheduled Meds: . sodium chloride   Intravenous Once  . acidophilus  1 capsule Oral q morning - 10a  . dexamethasone  10 mg Intravenous 4 times per day  . docusate sodium  100 mg Oral BID  . enoxaparin  100 mg Subcutaneous Q24H  . escitalopram  20 mg Oral Daily  . feeding supplement (ENSURE ENLIVE)  237 mL Oral Once  . feeding supplement (PRO-STAT SUGAR FREE 64)  30 mL Oral TID  . fluconazole  100 mg Oral Daily  . magic mouthwash  10 mL Oral TID  . methadone  2.5 mg Oral 3 times per day  . methylphenidate  5 mg Oral BID WC  . metoCLOPramide (REGLAN) injection  20 mg Intravenous 4 times per day  . montelukast  10 mg Oral QHS  . multivitamin with minerals  1 tablet Oral q morning - 10a  . pantoprazole (PROTONIX) IV  40 mg Intravenous Q12H  . sodium chloride flush  3 mL Intravenous Q12H  . Tbo-filgastrim (GRANIX) SQ  300 mcg Subcutaneous q1800  . valACYclovir  1,000 mg Oral Daily    Continuous Infusions: . sodium chloride 100 mL/hr at 02/26/16 [id number]    Time spent on care of this patient: 25 min   Kelvin Cellar, MD 02/26/2016, 2:00 PM  LOS: 4 days   [facility] Office  [phone] Pager - Text Page per [email or web address] If 7PM-7AM, please contact night-coverage [email or web address]

## 2016-02-26 NOTE — Progress Notes (Signed)
Nutrition Follow-up  DOCUMENTATION CODES:   Severe malnutrition in context of chronic illness  INTERVENTION:   - Continue Prostat TID mixed in a beverage of patients choice. - Continue Magic Mouthwash to help with sore mouth. - Provide patient with Ensure Enlive once for trial and PRN BID, each supplement provides 350 kcals and 20 grams of protein.  - Will continue to follow for nutritional needs.   NUTRITION DIAGNOSIS:   Malnutrition related to chronic illness as evidenced by percent weight loss, energy intake < or equal to 75% for > or equal to 1 month, moderate depletion of body fat, moderate depletions of muscle mass.  Ongoing  GOAL:   Patient will meet greater than or equal to 90% of their needs  Unmet  MONITOR:   PO intake, Supplement acceptance, Labs, Weight trends, Skin  ASSESSMENT:   50 y.o. female with metastatic breast cancer actively undergoing chemotherapy (last session 3/7), ventricular thrombus, pancytopenia presents for slurred speech and weakness on the left side. Symptoms started about 5 days ago and haven't progressed. Initially only her speech was slurred now she is weak on the left side and having difficulty ambulating. Imaging in the ER reveals brain metastases. No complaint of numbness tingling, visual changes or slurred speech.  Patient reports that her nausea is more controlled since the last visit.  Pt with 1 episode of vomiting yesterday.  Pt has been consuming some of the Prostat mixed with a beverage of choice, but unable to recall how many per day.  Family member in room encourages patient to keep consuming the Prostat and said that pt's husband had said that they were going to try a chocolate shake this afternoon.  Patient is constipated, still with no BM.  States that she is unable to get enough privacy since she cannot use the restroom alone.     Patient is still not meeting kcal or protein needs.  Hopefully with nausea under control she will be  able to better tolerate po intake. Patient amenable to trying a chocolate shake this afternoon.  States that she prefers the [employer] but would be amenable to trying a Chocolate Ensure if the other is not available. Will also continue adding Prostat TID to a beverage of pt's choice.  If patient begins an increased po intake, may consider monitoring magnesium, phosphorus, and potassium as patient could be at risk for refeeding syndrome due to her limited intake over the past 1-2 weeks and her severe malnutrition in the context of chronic illness.  Medications reviewed: Magic Mouthwash, MVI, Reglan.  Labs reviewed: low potassium (3.4), elevated glucose (140-174)   3/16 - first radiation treatment 3/15 - underwent stereotactic radiosurgery Per Internal Medicine note 3/15, pt with continued constipation, changed to suppository today in hopes a BM will help the vomiting resolve.  Diet Order:  Diet Heart Room service appropriate?: Yes; Fluid consistency:: Thin  Skin:  Reviewed, no issues  Last BM:  Unknown  Height:   Ht Readings from Last 1 Encounters:  02/22/16 5\' 3"  (1.6 m)    Weight:   Wt Readings from Last 1 Encounters:  02/22/16 163 lb (73.936 kg)    Ideal Body Weight:  52.3 kg  BMI:  Body mass index is 28.88 kg/(m^2).  Estimated Nutritional Needs:   Kcal:  2000-2200  Protein:  100-115 grams  Fluid:  >/= 2L  EDUCATION NEEDS:   No education needs identified at this time  Kristin Hoffman, Dietetic Intern Pager: [phone]

## 2016-02-26 NOTE — Progress Notes (Signed)
Kristin Hoffman is much more alert today. She is opening her eyes. She is talking more. She seems to be much more alert. Hopefully, this is a sign that the cerebral edema is decreasing.  She goes for her second stereotactic radiation treatment today.  She did eat a little bit more yesterday. I started her on Reglan. She still had 1 episode of vomiting. I may increase the Reglan to 4 times a day. I will give her a dose of [place].  She is not bleeding. She's had no fever.  She's not having too much pain. She does have some abdominal discomfort. This might be from metastasis. I will know. If it might be from some constipation. She really is not eating that much.  There are no labs back today as of yet. She is on Neupogen for her leukopenia.  On her physical exam, her vital signs are stable. Her temperature is 98.4. Pulse is 111. Blood pressure 124/84. Her oral exam shows no thrush. Oral mucosa slightly dry. Her lungs are clear. Cardiac exam tachycardic but regular. Abdomen is soft. Bowel sounds are decreased. She has no guarding or rebound tenderness. Extremities shows some trace edema in her legs.  She gets her second radiosurgery today. Again she is more alert. Hopefully, she will be oh to get out of bed and sit up in a chair. I think this will help her appetite.  I would keep her until her third treatment and final treatment which will be on Monday. I think it is helpful and imperative that she stay in the hospital so that she can continue to improve.  She's getting fantastic care from all of the nurses upon [place] [address]: 1-2

## 2016-02-26 NOTE — Progress Notes (Signed)
  Radiation Oncology         [phone] ________________________________  Stereotactic Treatment Procedure Note  Name: EVERLI GENCO MRN: [id number]  Date: 02/24/2016  DOB: 1966/02/06  SPECIAL TREATMENT PROCEDURE    ICD-9-CM ICD-10-CM   1. Brain metastasis ([place]) 198.3 C79.31     Fraction 1/3  3D TREATMENT PLANNING AND DOSIMETRY:  The patient's radiation plan was reviewed and approved by neurosurgery and radiation oncology prior to treatment.  It showed 3-dimensional radiation distributions overlaid onto the planning CT/MRI image set.  [facility] for the target structures as well as the organs at risk were reviewed. The documentation of the 3D plan and dosimetry are filed in the radiation oncology EMR.  NARRATIVE:  JAYLAA NODA was brought to the TrueBeam stereotactic radiation treatment machine and placed supine on the CT couch. The head frame was applied, and the patient was set up for stereotactic radiosurgery.  Neurosurgery was present for the set-up and delivery  SIMULATION VERIFICATION:  In the couch zero-angle position, the patient underwent Exactrac imaging using the Brainlab system with orthogonal KV images.  These were carefully aligned and repeated to confirm treatment position for each of the isocenters.  The Exactrac snap film verification was repeated at each couch angle.  PROCEDURE: [name] received stereotactic radiosurgery to the following targets: Right basal ganglia target was treated using 4 Rapid Arc VMAT Beams to a prescription dose of 8 Gy to be repeated 3 times for total of 24 Gy.  ExacTrac registration was performed for each couch angle.  The 100% isodose line was prescribed.  6 MV X-rays were delivered in the flattening filter free beam mode. Rt Temporal 10 mm target was treated using 4 Dynamic Conformal Arcs to a prescription dose of 20 Gy.  ExacTrac registration was performed for each couch angle.  The 100% isodose line was prescribed.  6 MV X-rays were  delivered in the flattening filter free beam mode.  STEREOTACTIC TREATMENT MANAGEMENT:  Following delivery, the patient was transported to nursing in stable condition and monitored for possible acute effects.  Vital signs were recorded There were no vitals taken for this visit.. The patient tolerated treatment without significant acute effects, and was discharged to home in stable condition.    PLAN: Continue as planned.  ________________________________  Sheral Apley. Tammi Klippel, M.D.

## 2016-02-27 DIAGNOSIS — R4182 Altered mental status, unspecified: Secondary | ICD-10-CM

## 2016-02-27 DIAGNOSIS — C7931 Secondary malignant neoplasm of brain: Principal | ICD-10-CM

## 2016-02-27 DIAGNOSIS — D61818 Other pancytopenia: Secondary | ICD-10-CM

## 2016-02-27 DIAGNOSIS — R110 Nausea: Secondary | ICD-10-CM

## 2016-02-27 LAB — BASIC METABOLIC PANEL
Anion gap: 7 (ref 5–15)
BUN: 16 mg/dL (ref 6–20)
CALCIUM: 8.5 mg/dL — AB (ref 8.9–10.3)
CHLORIDE: 105 mmol/L (ref 101–111)
CO2: 27 mmol/L (ref 22–32)
CREATININE: 0.51 mg/dL (ref 0.44–1.00)
GFR calc non Af Amer: >60 mL/min (ref >60)
Glucose, Bld: 162 mg/dL — ABNORMAL HIGH (ref 65–99)
Potassium: 3.4 mmol/L — ABNORMAL LOW (ref 3.5–5.1)
SODIUM: 139 mmol/L (ref 135–145)

## 2016-02-27 LAB — CBC WITH DIFFERENTIAL/PLATELET
BASOS ABS: 0 10*3/uL (ref 0.0–0.1)
BASOS PCT: 1 %
EOS ABS: 0 10*3/uL (ref 0.0–0.7)
EOS PCT: 0 %
HCT: 28.6 % — ABNORMAL LOW (ref 36.0–46.0)
Hemoglobin: 9.3 g/dL — ABNORMAL LOW (ref 12.0–15.0)
Lymphocytes Relative: 10 %
Lymphs Abs: 0.2 10*3/uL — ABNORMAL LOW (ref 0.7–4.0)
MCH: 37.1 pg — AB (ref 26.0–34.0)
MCHC: 32.5 g/dL (ref 30.0–36.0)
MCV: 113.9 fL — ABNORMAL HIGH (ref 78.0–100.0)
Monocytes Absolute: 0.3 10*3/uL (ref 0.1–1.0)
Monocytes Relative: 14 %
NEUTROS PCT: 75 %
Neutro Abs: 1.4 10*3/uL — ABNORMAL LOW (ref 1.7–7.7)
Platelets: 31 10*3/uL — ABNORMAL LOW (ref 150–400)
RBC: 2.51 MIL/uL — AB (ref 3.87–5.11)
RDW: 22.4 % — AB (ref 11.5–15.5)
WBC: 1.8 10*3/uL — AB (ref 4.0–10.5)

## 2016-02-27 NOTE — Progress Notes (Signed)
Kristin Hoffman   DOB:1965-12-24   [id number]   This is a patient of [name], admitted with significant change in mental status and was subsequently found to have brain metastases. She is currently receiving stereotactic radiosurgery to treat brain metastasis Subjective: Her main symptoms is weakness, poorly controlled nausea, poor appetite and minimal oral intake. She denies significant headache, recent seizures, visual changes or speech disturbances. She denies constipation. Denies chest pain or shortness of breath. The patient denies any recent signs or symptoms of bleeding such as spontaneous epistaxis, hematuria or hematochezia.   Objective:  Filed Vitals:   02/26/16 2146 02/27/16 0527  BP: 139/96 156/90  Pulse: 109 104  Temp: 98.3 F (36.8 C) 98.8 F (37.1 C)  Resp: 18 18     Intake/Output Summary (Last 24 hours) at 02/27/16 1719 Last data filed at 02/27/16 [id number]  Gross per 24 hour  Intake 2628.67 ml  Output      0 ml  Net 2628.67 ml    GENERAL:alertBut appears weak, no distress and comfortable SKIN: skin color is pale, texture, turgor are normal, no rashes or significant lesions EYES: normal, Conjunctiva are pale and non-injected, sclera clear OROPHARYNX:no exudate, no erythema and lips, buccal mucosa, and tongue normal  NECK: supple, thyroid normal size, non-tender, without nodularity LYMPH:  no palpable lymphadenopathy in the cervical, axillary or inguinal LUNGS: clear to auscultation and percussion with normal breathing effort HEART: regular rate & rhythm and no murmurs and no lower extremity edema ABDOMEN:abdomen soft, non-tender and normal bowel sounds Musculoskeletal:no cyanosis of digits and no clubbing  NEURO: alert & oriented x 3 with fluent speech, not able to assess neurological deficit due to weakness   Labs:  Lab Results  Component Value Date   WBC 1.8* 02/27/2016   HGB 9.3* 02/27/2016   HCT 28.6* 02/27/2016   MCV 113.9* 02/27/2016   PLT 31*  02/27/2016   NEUTROABS 1.4* 02/27/2016    Lab Results  Component Value Date   NA 139 02/27/2016   K 3.4* 02/27/2016   CL 105 02/27/2016   CO2 27 02/27/2016   Assessment & Plan:   #1 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, 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 Her overall performance status is very poor right now She is receiving stereotactic radiosurgery We'll defer to her oncologist next week to discuss treatment options Continue aggressive supportive care  #2 pancytopenia Could be related to recent chemotherapy versus bone metastasis She has received G-CSF She has no recent bleeding Continue close observation of her blood counts  #3 cardiac thrombus #4 history of TIA She will continue on anticoagulation therapy despite low platelet count So far, she has no signs of bleeding  #5 uncontrolled nausea She is receiving IV fluids and IV antiemetics This could be related to either recent chemotherapy or brain metastasis I will consult palliative care to help transition some of her IV anti-emetics to either scopolamine patch or other forms of antiemetics that could be administered at home upon discharge  #6 discharge planning She is not ready to be discharged due to poor performance status and dependency on IV fluids and IV anti-emetics I spoke with the patient and husband regarding the role of palliative care just from the standpoint of symptomatic management. She agrees with the consult and I will consult palliative care just for symptomatic management Her husband would like to keep her in the hospital if possible at least until Wednesday, which is the day that [name] will return  back to work to discuss further care with him As the patient is dependent on IV medication, she will not be ready for discharge any time soon The plan of care above is discussed with the hospitalist   [facility], [place], MD 02/27/2016  5:19 PM

## 2016-02-27 NOTE — Progress Notes (Signed)
[facility] Progress Note   Kristin Hoffman  [id number]  DOB: 1965-12-28  DOA: 02/22/2016 PCP: Drake Leach, MD  Brief narrative: Kristin Hoffman is a 50 y.o. female with metastatic breast cancer, ventricular thrombus, pancytopenia presents for slurred speech, left facial droop and weakness on the left side. Symptoms started about 5 days ago and haven't progressed. Initially only her speech was slurred and there was a facial droop now she is weak on the left side and having difficulty ambulating. Imaging in the ER reveals brain metastases with impending herniation. She will start radiation treatments today.    Subjective: She appears a little more sleepy this morning, family at bedside reporting poor oral intake.   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 -She presented with left facial, arm/ leg weakness with midline shift and severe vasogenic edema- symptoms improving with Decadron -CT scan of brain without contrast performed on 02/22/2016 showed extensive vasogenic edema on the right causing effacement of the right lateral ventricle with 7 mm midline shift -MRI of brain performed on 02/22/2016 showing 2 enhancing masses within the right atmosphere with extensive surrounding vasogenic edema. Micro-hemorrhages present in both lesions. -Evaluated by Dr Tammi Klippel, she was started on daily treatments for now- first treatment this afternoon at 5 pm -Dr Trenton Gammon consulted -She underwent stereotactic radiosurgery performed on 02/24/2016. -On 02/26/2016 she showed significant clinical improvement appear more awake and alert, interactive. She underwent 2nd stereotactic treatment on 02/26/2016.   -Plan for 3rd treatment on Monday morning.   Active Problems: Pancytopenia secondary to  chemotherapy -Pancytopenia secondary to chemotherapy. -A.m. labs on 02/25/2049 showing an upward trend in her white count to 2100, previously 1100 on 02/24/2016. -Medical oncology following, remains on Granix 300 g subcutaneous daily. -Labs on 02/27/2016 showing improved neutrophil count at 1.4  Nausea/ Vomiting -Likely due to brain metastasis- PRN Zofran - cont slow IVF -Improving although continues to have poor by mouth intake.  Weight loss/ Malnutrition  - cont Prostat as ordered by nutritionist  Mouth sores - magic mouthwash   Thrombocytopenia  -Lab showing a stable platelet count of 31,000   Ventricular mural thrombus - Dr Jana Hakim last recommended to continue Lovenox despite her low platelets as the benefit outweighed the risk- Dr Marin Olp agrees with this plan -Continue Lovenox (also recommended by cardiology after MRI performed a few days ago)    Antibiotics: Anti-infectives    Start     Dose/Rate Route Frequency Ordered Stop   02/23/16 1000  fluconazole (DIFLUCAN) tablet 100 mg     100 mg Oral Daily 02/23/16 0722     02/22/16 1800  valACYclovir (VALTREX) tablet 1,000 mg     1,000 mg Oral Daily 02/22/16 1652       Code Status: -she states she wants to be a full code and if afterwards she is not able to be functional without life support, she would like her family to remove her from life support    Code Status Orders        Start     Ordered   02/22/16 1728  Full code   Continuous     02/22/16 1728    Code Status History    Date Active Date Inactive Code Status Order ID Comments User Context   05/29/2015  9:51 PM 05/30/2015  5:54 PM Full  Code [id number]  Toy Baker, MD Inpatient   12/17/2014  2:39 PM 12/18/2014  3:42 AM Full Code [id number]  Azzie Roup, MD [place]   11/08/2012  7:19 PM 11/12/2012  9:42 PM Full Code [id number]  Kathalene Frames, MD ED     Family Communication: husband Disposition Plan: home when she feels better- can come for radiation  treatments from home DVT prophylaxis: Lovenox Consultants: Onc- medical and radiation, Neurosurgery Procedures:     Objective: Filed Weights   02/22/16 1043  Weight: 73.936 kg (163 lb)    Intake/Output Summary (Last 24 hours) at 02/27/16 1344 Last data filed at 02/27/16 0640  Gross per 24 hour  Intake 2628.67 ml  Output      0 ml  Net 2628.67 ml     Vitals Filed Vitals:   02/25/16 1336 02/25/16 2353 02/26/16 2146 02/27/16 0527  BP: 142/95 124/84 139/96 156/90  Pulse: 105 111 109 104  Temp: 98.4 F (36.9 C) 98.4 F (36.9 C) 98.3 F (36.8 C) 98.8 F (37.1 C)  TempSrc: Oral Oral Oral Oral  Resp:  18 18 18   Height:      Weight:      SpO2: 95% 96% 93% 92%    Exam:  General:  She appears lethargic, however is arousable.   HEENT: No icterus, No thrush, oral mucosa moist- pupils dilated  Cardiovascular: regular rate and rhythm, S1/S2 No murmur  Respiratory: clear to auscultation bilaterally   Abdomen: Vomiting-  Soft, +Bowel sounds, non tender, non distended, no guarding  MSK: No cyanosis or clubbing- no pedal edema  Neuro: facial droop resolved, strength still 4/5 in left arm and leg   Data Reviewed: Basic Metabolic Panel:  Recent Labs Lab 02/23/16 0615 02/24/16 0430 02/25/16 0410 02/26/16 0810 02/27/16 0323  NA 142 142 138 141 139  K 3.3* 3.8 3.4* 3.4* 3.4*  CL 98* 101 101 107 105  CO2 35* 35* 31 27 27   GLUCOSE 186* 145* 140* 174* 162*  BUN 14 14 14 17 16   CREATININE 0.45 0.42* 0.48 0.60 0.51  CALCIUM 9.5 9.3 8.7* 8.5* 8.5*  MG 1.7  --   --   --   --    Liver Function Tests:  Recent Labs Lab 02/22/16 1155  AST 39  ALT 27  ALKPHOS 78  BILITOT 1.1  PROT 5.1*  ALBUMIN 2.9*   No results for input(s): LIPASE, AMYLASE in the last 168 hours.  Recent Labs Lab 02/22/16 1155  AMMONIA 42*   CBC:  Recent Labs Lab 02/22/16 1155 02/23/16 0615 02/24/16 0920 02/25/16 0410 02/26/16 0810 02/27/16 0323  WBC 2.0* 2.3* 1.1* 1.5* 2.1* 1.8*   NEUTROABS 1.7  --   --  1.2*  --  1.4*  HGB 10.6* 10.8* 9.3* 9.1* 8.9* 9.3*  HCT 32.7* 33.2* 28.5* 28.0* 26.5* 28.6*  MCV 114.3* 112.9* 114.0* 113.4* 108.2* 113.9*  PLT 24* 35* 24* 25* 29* 31*   Cardiac Enzymes: No results for input(s): CKTOTAL, CKMB, CKMBINDEX, TROPONINI in the last 168 hours. BNP (last 3 results)  Recent Labs  05/29/15 1521  BNP 29.0    ProBNP (last 3 results) No results for input(s): PROBNP in the last 8760 hours.  CBG: No results for input(s): GLUCAP in the last 168 hours.  Recent Results (from the past 240 hour(s))  Urine culture     Status: None   Collection Time: 02/22/16  3:13 PM  Result Value Ref Range Status   Specimen Description URINE, CLEAN CATCH  Final   Special Requests NONE  Final   Culture   Final    6,000 COLONIES/mL INSIGNIFICANT GROWTH Performed at [facility]    Report Status 02/23/2016 FINAL  Final     Studies: No results found.  Scheduled Meds:  Scheduled Meds: . sodium chloride   Intravenous Once  . acidophilus  1 capsule Oral q morning - 10a  . dexamethasone  10 mg Intravenous 4 times per day  . docusate sodium  100 mg Oral BID  . enoxaparin  100 mg Subcutaneous Q24H  . escitalopram  20 mg Oral Daily  . feeding supplement (PRO-STAT SUGAR FREE 64)  30 mL Oral TID  . fluconazole  100 mg Oral Daily  . magic mouthwash  10 mL Oral TID  . methadone  2.5 mg Oral 3 times per day  . methylphenidate  5 mg Oral BID WC  . metoCLOPramide (REGLAN) injection  20 mg Intravenous 4 times per day  . montelukast  10 mg Oral QHS  . multivitamin with minerals  1 tablet Oral q morning - 10a  . pantoprazole (PROTONIX) IV  40 mg Intravenous Q12H  . sodium chloride flush  3 mL Intravenous Q12H  . valACYclovir  1,000 mg Oral Daily   Continuous Infusions: . sodium chloride 100 mL/hr at 02/27/16 0423    Time spent on care of this patient: 25 min   Kelvin Cellar, MD 02/27/2016, 1:44 PM  LOS: 5 days   [facility] Office  [phone] Pager - Text Page per [email or web address] If 7PM-7AM, please contact night-coverage [email or web address]

## 2016-02-28 ENCOUNTER — Other Ambulatory Visit: Payer: Self-pay | Admitting: Hematology and Oncology

## 2016-02-28 LAB — CBC
HCT: 28 % — ABNORMAL LOW (ref 36.0–46.0)
Hemoglobin: 9.1 g/dL — ABNORMAL LOW (ref 12.0–15.0)
MCH: 37 pg — AB (ref 26.0–34.0)
MCHC: 32.5 g/dL (ref 30.0–36.0)
MCV: 113.8 fL — AB (ref 78.0–100.0)
PLATELETS: 26 10*3/uL — AB (ref 150–400)
RBC: 2.46 MIL/uL — AB (ref 3.87–5.11)
RDW: 23 % — AB (ref 11.5–15.5)
WBC: 1.8 10*3/uL — AB (ref 4.0–10.5)

## 2016-02-28 LAB — BASIC METABOLIC PANEL
ANION GAP: 5 (ref 5–15)
BUN: 17 mg/dL (ref 6–20)
CALCIUM: 8.4 mg/dL — AB (ref 8.9–10.3)
CO2: 26 mmol/L (ref 22–32)
Chloride: 108 mmol/L (ref 101–111)
Creatinine, Ser: 0.52 mg/dL (ref 0.44–1.00)
GFR calc Af Amer: >60 mL/min (ref >60)
GLUCOSE: 161 mg/dL — AB (ref 65–99)
POTASSIUM: 3.4 mmol/L — AB (ref 3.5–5.1)
SODIUM: 139 mmol/L (ref 135–145)

## 2016-02-28 NOTE — Progress Notes (Signed)
[facility] Progress Note   Kristin Hoffman  [id number]  DOB: 1966-02-25  DOA: 02/22/2016 PCP: Drake Leach, MD  Brief narrative: Kristin Hoffman is a 50 y.o. female with metastatic breast cancer, ventricular thrombus, pancytopenia presents for slurred speech, left facial droop and weakness on the left side. Symptoms started about 5 days ago and haven't progressed. Initially only her speech was slurred and there was a facial droop now she is weak on the left side and having difficulty ambulating. Imaging in the ER reveals brain metastases with impending herniation. She will start radiation treatments today.    Subjective: She appears a little more sleepy this morning, family at bedside reporting poor oral intake.   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 -She presented with left facial, arm/ leg weakness with midline shift and severe vasogenic edema- symptoms improving with Decadron -CT scan of brain without contrast performed on 02/22/2016 showed extensive vasogenic edema on the right causing effacement of the right lateral ventricle with 7 mm midline shift -MRI of brain performed on 02/22/2016 showing 2 enhancing masses within the right atmosphere with extensive surrounding vasogenic edema. Micro-hemorrhages present in both lesions. -Evaluated by Dr Tammi Klippel, she was started on daily treatments for now- first treatment this afternoon at 5 pm -Dr Trenton Gammon consulted -She underwent stereotactic radiosurgery performed on 02/24/2016. -On 02/26/2016 she showed significant clinical improvement appear more awake and alert, interactive. She underwent 2nd stereotactic treatment on 02/26/2016.   -Plan for 3rd treatment on Monday morning.   Active Problems: Pancytopenia secondary to  chemotherapy -Pancytopenia secondary to chemotherapy. -A.m. labs on 02/25/2049 showing an upward trend in her white count to 2100, previously 1100 on 02/24/2016. -Medical oncology following, remains on Granix 300 g subcutaneous daily. -Labs on 02/27/2016 showing improved neutrophil count at 1.4  Nausea/ Vomiting -Likely due to brain metastasis- PRN Zofran - cont slow IVF -Improving although continues to have poor by mouth intake.  Weight loss/ Malnutrition  - cont Prostat as ordered by nutritionist  Mouth sores - magic mouthwash   Thrombocytopenia  -Lab showing a stable platelet count of 31,000   Ventricular mural thrombus - Dr Jana Hakim last recommended to continue Lovenox despite her low platelets as the benefit outweighed the risk- Dr Marin Olp agrees with this plan -Continue Lovenox (also recommended by cardiology after MRI performed a few days ago)  Goals of Care   I spoke to Dr Alvy Bimler of Medical oncology who felt a consultation to palliative care would be appropriate. I agree with her. She has advanced disease and would benefit from symptom management.   Antibiotics: Anti-infectives    Start     Dose/Rate Route Frequency Ordered Stop   02/23/16 1000  fluconazole (DIFLUCAN) tablet 100 mg     100 mg Oral Daily 02/23/16 0722     02/22/16 1800  valACYclovir (VALTREX) tablet 1,000 mg     1,000 mg Oral Daily 02/22/16 1652       Code Status: -she states she wants to be a full code and if afterwards she is not able to be functional without life support, she would like her family to remove her from life support    Code Status Orders        Start     Ordered   02/22/16 1728  Full code   Continuous  02/22/16 1728    Code Status History    Date Active Date Inactive Code Status Order ID Comments User Context   05/29/2015  9:51 PM 05/30/2015  5:54 PM Full Code [id number]  Toy Baker, MD Inpatient   12/17/2014  2:39 PM 12/18/2014  3:42 AM Full Code [id number]  Azzie Roup, MD [place]   11/08/2012  7:19 PM 11/12/2012  9:42 PM Full Code [id number]  Kathalene Frames, MD ED     Family Communication: husband Disposition Plan: home when she feels better- can come for radiation treatments from home DVT prophylaxis: Lovenox Consultants: Onc- medical and radiation, Neurosurgery Procedures:     Objective: Filed Weights   02/22/16 1043  Weight: 73.936 kg (163 lb)    Intake/Output Summary (Last 24 hours) at 02/28/16 0835 Last data filed at 02/27/16 2300  Gross per 24 hour  Intake 1611.33 ml  Output      0 ml  Net 1611.33 ml     Vitals Filed Vitals:   02/26/16 2146 02/27/16 0527 02/27/16 2045 02/28/16 0634  BP: 139/96 156/90 135/85 142/94  Pulse: 109 104 105 97  Temp: 98.3 F (36.8 C) 98.8 F (37.1 C) 98.3 F (36.8 C) 98.3 F (36.8 C)  TempSrc: Oral Oral Oral Oral  Resp: 18 18 18 16   Height:      Weight:      SpO2: 93% 92% 94% 94%    Exam:  General:  She appears lethargic, however is arousable.   HEENT: No icterus, No thrush, oral mucosa moist- pupils dilated  Cardiovascular: regular rate and rhythm, S1/S2 No murmur  Respiratory: clear to auscultation bilaterally   Abdomen: Vomiting-  Soft, +Bowel sounds, non tender, non distended, no guarding  MSK: No cyanosis or clubbing- no pedal edema  Neuro: facial droop resolved, strength still 4/5 in left arm and leg   Data Reviewed: Basic Metabolic Panel:  Recent Labs Lab 02/23/16 0615 02/24/16 0430 02/25/16 0410 02/26/16 0810 02/27/16 0323 02/28/16 0340  NA 142 142 138 141 139 139  K 3.3* 3.8 3.4* 3.4* 3.4* 3.4*  CL 98* 101 101 107 105 108  CO2 35* 35* 31 27 27 26   GLUCOSE 186* 145* 140* 174* 162* 161*  BUN 14 14 14 17 16 17   CREATININE 0.45 0.42* 0.48 0.60 0.51 0.52  CALCIUM 9.5 9.3 8.7* 8.5* 8.5* 8.4*  MG 1.7  --   --   --   --   --    Liver Function Tests:  Recent Labs Lab 02/22/16 1155  AST 39  ALT 27  ALKPHOS 78  BILITOT 1.1  PROT 5.1*  ALBUMIN 2.9*   No  results for input(s): LIPASE, AMYLASE in the last 168 hours.  Recent Labs Lab 02/22/16 1155  AMMONIA 42*   CBC:  Recent Labs Lab 02/22/16 1155  02/24/16 0920 02/25/16 0410 02/26/16 0810 02/27/16 0323 02/28/16 0340  WBC 2.0*  < > 1.1* 1.5* 2.1* 1.8* 1.8*  NEUTROABS 1.7  --   --  1.2*  --  1.4*  --   HGB 10.6*  < > 9.3* 9.1* 8.9* 9.3* 9.1*  HCT 32.7*  < > 28.5* 28.0* 26.5* 28.6* 28.0*  MCV 114.3*  < > 114.0* 113.4* 108.2* 113.9* 113.8*  PLT 24*  < > 24* 25* 29* 31* 26*  < > = values in this interval not displayed. Cardiac Enzymes: No results for input(s): CKTOTAL, CKMB, CKMBINDEX, TROPONINI in the last 168 hours. BNP (last 3 results)  Recent Labs  05/29/15 1521  BNP 29.0    ProBNP (last 3 results) No results for input(s): PROBNP in the last 8760 hours.  CBG: No results for input(s): GLUCAP in the last 168 hours.  Recent Results (from the past 240 hour(s))  Urine culture     Status: None   Collection Time: 02/22/16  3:13 PM  Result Value Ref Range Status   Specimen Description URINE, CLEAN CATCH  Final   Special Requests NONE  Final   Culture   Final    6,000 COLONIES/mL INSIGNIFICANT GROWTH Performed at [facility]    Report Status 02/23/2016 FINAL  Final     Studies: No results found.  Scheduled Meds:  Scheduled Meds: . sodium chloride   Intravenous Once  . acidophilus  1 capsule Oral q morning - 10a  . dexamethasone  10 mg Intravenous 4 times per day  . docusate sodium  100 mg Oral BID  . enoxaparin  100 mg Subcutaneous Q24H  . escitalopram  20 mg Oral Daily  . feeding supplement (PRO-STAT SUGAR FREE 64)  30 mL Oral TID  . fluconazole  100 mg Oral Daily  . magic mouthwash  10 mL Oral TID  . methadone  2.5 mg Oral 3 times per day  . methylphenidate  5 mg Oral BID WC  . metoCLOPramide (REGLAN) injection  20 mg Intravenous 4 times per day  . montelukast  10 mg Oral QHS  . multivitamin with minerals  1 tablet Oral q morning - 10a  .  pantoprazole (PROTONIX) IV  40 mg Intravenous Q12H  . sodium chloride flush  3 mL Intravenous Q12H  . valACYclovir  1,000 mg Oral Daily   Continuous Infusions: . sodium chloride 100 mL/hr at 02/28/16 0202    Time spent on care of this patient: 25 min   Kelvin Cellar, MD 02/28/2016, 8:35 AM  LOS: 6 days   [facility] Office  [phone] Pager - Text Page per [email or web address] If 7PM-7AM, please contact night-coverage [email or web address]

## 2016-02-28 NOTE — Progress Notes (Signed)
Kristin Hoffman   DOB:09/05/1966   [id number]    Subjective: Her symptoms are about the same. She denies headache. She is not easily arousable, with poor oral intake and continued to have persistent nausea. No new neurological deficit or seizures..The patient denies any recent signs or symptoms of bleeding such as spontaneous epistaxis, hematuria or hematochezia.   Objective:  Filed Vitals:   02/28/16 0634 02/28/16 1309  BP: 142/94 145/94  Pulse: 97 109  Temp: 98.3 F (36.8 C) 98.4 F (36.9 C)  Resp: 16 18     Intake/Output Summary (Last 24 hours) at 02/28/16 1329 Last data filed at 02/28/16 1300  Gross per 24 hour  Intake 1677.33 ml  Output      0 ml  Net 1677.33 ml    GENERAL: She appears sedated, no distress and comfortable SKIN: skin color, texture, turgor are normal, no rashes or significant lesions Musculoskeletal:no cyanosis of digits and no clubbing  NEURO: Unable to assess. 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, 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 Her overall performance status is very poor right now She is receiving stereotactic radiosurgery We'll defer to her oncologist next week to discuss treatment options Continue aggressive supportive care  #2 pancytopenia Could be related to recent chemotherapy versus bone metastasis She has received G-CSF She has no recent bleeding Continue close observation of her blood counts  #3 cardiac thrombus #4 history of TIA She will continue on anticoagulation therapy despite low platelet count So far, she has no signs of bleeding  #5 uncontrolled  nausea She is receiving IV fluids and IV antiemetics This could be related to either recent chemotherapy or brain metastasis I will consult palliative care to help transition some of her IV anti-emetics to either scopolamine patch or other forms of antiemetics that could be administered at home upon discharge  #6 discharge planning She is not ready to be discharged due to poor performance status and dependency on IV fluids and IV anti-emetics I spoke with the patient and husband regarding the role of palliative care just from the standpoint of symptomatic management. She agrees with the consult and I will consult palliative care just for symptomatic management Her husband would like to keep her in the hospital if possible at least until Wednesday, which is the day that [name] will return back to work to discuss further care with him As the patient is dependent on IV medication, she will not be ready for discharge any time soon The plan of care above is discussed with the hospitalist    [facility], Daisy, MD 02/28/2016  1:29 PM

## 2016-02-28 NOTE — Progress Notes (Signed)
CRITICAL VALUE ALERT  Critical value received:  Platelets 26  Date of notification:  02/28/16  Time of notification:  0433  Critical value read back:yes  Nurse who received alert:  Davy Pique   MD notified (1st page): Rogue Bussing  Time of first page:  [phone]  MD notified (2nd page):  Time of second page:  Responding MD:  Rogue Bussing  Time MD responded:  [phone]

## 2016-02-29 ENCOUNTER — Ambulatory Visit
Admit: 2016-02-29 | Discharge: 2016-02-29 | Disposition: A | Discharge status: Home / Self Care | Payer: 59 | Attending: Radiation Oncology | Admitting: Radiation Oncology

## 2016-02-29 ENCOUNTER — Encounter: Payer: Self-pay | Admitting: Radiation Oncology

## 2016-02-29 DIAGNOSIS — Z515 Encounter for palliative care: Secondary | ICD-10-CM

## 2016-02-29 DIAGNOSIS — C7931 Secondary malignant neoplasm of brain: Secondary | ICD-10-CM

## 2016-02-29 LAB — CBC WITH DIFFERENTIAL/PLATELET
BASOS ABS: 0 10*3/uL (ref 0.0–0.1)
BASOS PCT: 0 %
EOS PCT: 0 %
Eosinophils Absolute: 0 10*3/uL (ref 0.0–0.7)
HEMATOCRIT: 28.2 % — AB (ref 36.0–46.0)
HEMOGLOBIN: 9.2 g/dL — AB (ref 12.0–15.0)
LYMPHS ABS: 0.2 10*3/uL — AB (ref 0.7–4.0)
Lymphocytes Relative: 11 %
MCH: 37.7 pg — ABNORMAL HIGH (ref 26.0–34.0)
MCHC: 32.6 g/dL (ref 30.0–36.0)
MCV: 115.6 fL — AB (ref 78.0–100.0)
MONO ABS: 0.3 10*3/uL (ref 0.1–1.0)
Monocytes Relative: 15 %
NRBC: 9 /100{WBCs} — AB
Neutro Abs: 1.6 10*3/uL — ABNORMAL LOW (ref 1.7–7.7)
Neutrophils Relative %: 74 %
Platelets: 25 10*3/uL — CL (ref 150–400)
RBC: 2.44 MIL/uL — ABNORMAL LOW (ref 3.87–5.11)
RDW: 23.8 % — AB (ref 11.5–15.5)
WBC: 2.1 10*3/uL — ABNORMAL LOW (ref 4.0–10.5)

## 2016-02-29 MED ORDER — METOCLOPRAMIDE HCL 10 MG PO TABS
10.0000 mg | ORAL_TABLET | Freq: Three times a day (TID) | ORAL | Status: DC
  Administered 2016-02-29 (×2): 10 mg via ORAL
  Filled 2016-02-29 (×3): qty 1

## 2016-02-29 MED ORDER — METOCLOPRAMIDE HCL 10 MG PO TABS
10.0000 mg | ORAL_TABLET | Freq: Three times a day (TID) | ORAL | Status: DC
  Filled 2016-02-29: qty 1

## 2016-02-29 MED ORDER — LORAZEPAM 2 MG/ML IJ SOLN
0.5000 mg | INTRAMUSCULAR | Status: DC | PRN
  Administered 2016-02-29: 0.5 mg via INTRAVENOUS
  Filled 2016-02-29: qty 1

## 2016-02-29 MED ORDER — METOCLOPRAMIDE HCL 5 MG/ML IJ SOLN
10.0000 mg | Freq: Three times a day (TID) | INTRAMUSCULAR | Status: DC
  Administered 2016-02-29 – 2016-03-05 (×15): 10 mg via INTRAVENOUS
  Filled 2016-02-29 (×16): qty 2

## 2016-02-29 MED ORDER — PANTOPRAZOLE SODIUM 40 MG PO TBEC
40.0000 mg | DELAYED_RELEASE_TABLET | Freq: Every day | ORAL | Status: DC
  Administered 2016-02-29: 40 mg via ORAL
  Filled 2016-02-29 (×2): qty 1

## 2016-02-29 MED ORDER — DEXAMETHASONE 4 MG PO TABS
4.0000 mg | ORAL_TABLET | Freq: Four times a day (QID) | ORAL | Status: DC
  Administered 2016-02-29 (×2): 4 mg via ORAL
  Filled 2016-02-29 (×25): qty 1

## 2016-02-29 NOTE — Progress Notes (Signed)
Kristin Hoffman had a relatively uneventful weekend. Unfortunately, she really is not making much in the way of progress. She still is not eating much. Now, she is having some diarrhea. She still has some nausea area did I will cut back on the Reglan a little bit.  She's had no bleeding. She is on anticoagulation for this. Her platelet count is 25,000. Her white cell count is 2.1. Her hemoglobin is 9.2. Everything seems to be holding relatively stable.  She has a little bit of trouble opening her right eye. When she does, I do not see any obvious discharge or erythema.  She still has some abdominal discomfort. His heart [place] much this really is worth is any change in the discomfort.  She really is not yet out of bed. I think she goes to the bedside commode.  On her physical exam, her vital signs appear relatively stable. Her blood pressure is 153/91. Pulse is 82. Her oral exam does not show any thrush. Lungs are clear. Cardiac exam regular in rhythm with occasional should beat. She has no murmurs. Abdomen is soft. Bowel sounds are slightly decreased. She has no fluid wave. There is no obvious abdominal mass or hepatomegaly. Extremities shows no clubbing, cyanosis patient chronic mild nonpitting edema.  Again, she will finish up the stereotactic radiation today. I am not sure what time this will finish up.  I will try to get on oral medications. Again, the real key is her trying to eat. I suppose Marinol can be tried. She is on anticoagulation so oral Megace could be considered.  Overall, the prognosis still is not that great. I know she is trying hard.  Pete E.  Ephesians 6:10

## 2016-02-29 NOTE — Progress Notes (Signed)
  Radiation Oncology         ([phone]) [phone] ________________________________  Stereotactic Treatment Procedure Note  Name: SAYWARD FOUTZ MRN: [id number]  Date: 02/22/2016  DOB: 1965-12-20  SPECIAL TREATMENT PROCEDURE  INPATIENT    ICD-9-CM ICD-10-CM   1. Brain metastasis (HCC) 198.3 C79.31 LORazepam (ATIVAN) injection 0.5 mg  2. Heart burn 787.1 R12 fluconazole (DIFLUCAN) tablet 100 mg     DISCONTINUED: lactulose (CHRONULAC) 10 GM/15ML solution 20 g  3. Breast cancer metastasized to multiple sites, unspecified laterality (HCC) 174.9 C50.919 fosaprepitant (EMEND) 150 mg in sodium chloride 0.9 % 145 mL IVPB   199.0  dexamethasone (DECADRON) tablet 4 mg     metoCLOPramide (REGLAN) tablet 10 mg     pantoprazole (PROTONIX) EC tablet 40 mg     DISCONTINUED: pantoprazole (PROTONIX) injection 40 mg  4. Breast cancer metastasized to multiple sites, right (HCC) 174.9 C50.911 DISCONTINUED: metoCLOPramide (REGLAN) 20 mg in dextrose 5 % 50 mL IVPB   199.0  DISCONTINUED: metoCLOPramide (REGLAN) 20 mg in dextrose 5 % 50 mL IVPB  5. Breast cancer metastasized to multiple sites, left (HCC) 174.9 C50.912 dexamethasone (DECADRON) tablet 4 mg   199.0  metoCLOPramide (REGLAN) tablet 10 mg     pantoprazole (PROTONIX) EC tablet 40 mg      3D TREATMENT PLANNING AND DOSIMETRY:  The patient's radiation plan was reviewed and approved by neurosurgery and radiation oncology prior to treatment.  It showed 3-dimensional radiation distributions overlaid onto the planning CT/MRI image set.  The [facility] for the target structures as well as the organs at risk were reviewed. The documentation of the 3D plan and dosimetry are filed in the radiation oncology EMR.  NARRATIVE:  MENUCHA BOLZ was brought to the TrueBeam stereotactic radiation treatment machine and placed supine on the CT couch. The head frame was applied, and the patient was set up for stereotactic radiosurgery.     SIMULATION VERIFICATION:  In the couch  zero-angle position, the patient underwent Exactrac imaging using the Brainlab system with orthogonal KV images.  These were carefully aligned and repeated to confirm treatment position for each of the isocenters.  The Exactrac snap film verification was repeated at each couch angle.  PROCEDURE: [name] received stereotactic radiosurgery to the following targets: Rt Basal Ganglia 3.0 cm target was treated using 4 VMAT Arcs to a prescription dose of 8 Gy to be repeated 3 times for total of 24 Gy.  ExacTrac registration was performed for each couch angle.  The 100% isodose line was prescribed.  6 MV X-rays were delivered in the flattening filter free beam mode.   STEREOTACTIC TREATMENT MANAGEMENT:  Following delivery, the patient was transported to nursing in stable condition and monitored for possible acute effects.  Vital signs were recorded BP 146/92 mmHg  Pulse 108  Temp(Src) 98 F (36.7 C) (Oral)  Resp 20  Ht 5\' 3"  (1.6 m)  Wt 163 lb (73.936 kg)  BMI 28.88 kg/m2  SpO2 98%. The patient tolerated treatment without significant acute effects, and was discharged to home in stable condition.    PLAN: Complete 3 fractions and then follow-up in one month.   ---------------------------------  Eppie Gibson, MD

## 2016-02-29 NOTE — Progress Notes (Signed)
Patient refused all po meds last night.  Will continue to monitor patient.

## 2016-02-29 NOTE — Progress Notes (Signed)
[facility] Progress Note   Kristin Hoffman  [id number]  DOB: 1966-09-04  DOA: 02/22/2016 PCP: Drake Leach, MD  Brief narrative: Kristin Hoffman is a 50 y.o. female with metastatic breast cancer, ventricular thrombus, pancytopenia presents for slurred speech, left facial droop and weakness on the left side. Symptoms started about 5 days ago and haven't progressed. Initially only her speech was slurred and there was a facial droop now she is weak on the left side and having difficulty ambulating. Imaging in the ER reveals brain metastases with impending herniation. She will start radiation treatments today.    Subjective: She appears a little more sleepy this morning, family at bedside reporting poor oral intake.   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 -She presented with left facial, arm/ leg weakness with midline shift and severe vasogenic edema- symptoms improving with Decadron -CT scan of brain without contrast performed on 02/22/2016 showed extensive vasogenic edema on the right causing effacement of the right lateral ventricle with 7 mm midline shift -MRI of brain performed on 02/22/2016 showing 2 enhancing masses within the right atmosphere with extensive surrounding vasogenic edema. Micro-hemorrhages present in both lesions. -Evaluated by Dr Tammi Klippel, she was started on daily treatments for now- first treatment this afternoon at 5 pm -Dr Trenton Gammon consulted -She underwent stereotactic radiosurgery performed on 02/24/2016. -On 02/26/2016 she showed significant clinical improvement appear more awake and alert, interactive. She underwent 2nd stereotactic treatment on 02/26/2016.   -Plan for 3rd treatment on 02/29/2016.  Active Problems:  Nausea -She continues to have minimal  oral intake as nausea continues to be an issue. I suspect that brain metastasis are contributing to symptoms. 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 -On 02/29/2016 lab work showed a white count of 2.1 with neutrophil count of 1.6. Platelet count remains at 25,000. -She received colony-stimulating factor  Weight loss/ Malnutrition  - cont Prostat as ordered by nutritionist  Mouth sores - magic mouthwash   Thrombocytopenia  -Lab showing a stable platelet count of 31,000   Ventricular mural thrombus - Dr Jana Hakim last recommended to continue Lovenox despite her low platelets as the benefit outweighed the risk- Dr Marin Olp agrees with this plan -Continue Lovenox 100 mg subcutaneous every 24 hours  Goals of Care   I spoke to Dr Alvy Bimler of Medical oncology who felt a consultation to palliative care would be appropriate. I agree with her. She has advanced disease and would benefit from symptom management.   Antibiotics: Anti-infectives    Start     Dose/Rate Route Frequency Ordered Stop   02/23/16 1000  fluconazole (DIFLUCAN) tablet 100 mg     100 mg Oral Daily 02/23/16 0722     02/22/16 1800  valACYclovir (VALTREX) tablet 1,000 mg     1,000 mg Oral Daily 02/22/16 1652       Code Status: -she states she wants to be a full code and if afterwards she is not able to be functional without life support, she would like her family to remove her from life support    Code Status Orders        Start     Ordered   02/22/16 1728  Full code   Continuous  02/22/16 1728    Code Status History    Date Active Date Inactive Code Status Order ID Comments User Context   05/29/2015  9:51 PM 05/30/2015  5:54 PM Full Code [id number]  Toy Baker, MD Inpatient   12/17/2014  2:39 PM 12/18/2014  3:42 AM Full Code [id number]  Azzie Roup, MD  [place]   11/08/2012  7:19 PM 11/12/2012  9:42 PM Full Code [id number]  Kathalene Frames, MD ED     Family Communication: husband Disposition Plan: home when she feels better- can come for radiation treatments from home DVT prophylaxis: Lovenox Consultants: Onc- medical and radiation, Neurosurgery Procedures:     Objective: Filed Weights   02/22/16 1043  Weight: 73.936 kg (163 lb)    Intake/Output Summary (Last 24 hours) at 02/29/16 0814 Last data filed at 02/29/16 0700  Gross per 24 hour  Intake   4736 ml  Output      1 ml  Net   4735 ml     Vitals Filed Vitals:   02/28/16 0634 02/28/16 1309 02/28/16 2117 02/29/16 0650  BP: 142/94 145/94 147/98 153/91  Pulse: 97 109 104 82  Temp: 98.3 F (36.8 C) 98.4 F (36.9 C) 98 F (36.7 C) 98.9 F (37.2 C)  TempSrc: Oral Oral Oral Oral  Resp: 16 18 20 20   Height:      Weight:      SpO2: 94% 94% 92% 92%    Exam:  General:  She appears lethargic, however is arousable. Little improvement from yesterday's exam  HEENT: No icterus, No thrush, oral mucosa moist- pupils dilated  Cardiovascular: regular rate and rhythm, S1/S2 No murmur  Respiratory: clear to auscultation bilaterally   Abdomen: Vomiting-  Soft, +Bowel sounds, non tender, non distended, no guarding  MSK: No cyanosis or clubbing- no pedal edema  Neuro: facial droop resolved, strength still 4/5 in left arm and leg   Data Reviewed: Basic Metabolic Panel:  Recent Labs Lab 02/23/16 0615 02/24/16 0430 02/25/16 0410 02/26/16 0810 02/27/16 0323 02/28/16 0340  NA 142 142 138 141 139 139  K 3.3* 3.8 3.4* 3.4* 3.4* 3.4*  CL 98* 101 101 107 105 108  CO2 35* 35* 31 27 27 26   GLUCOSE 186* 145* 140* 174* 162* 161*  BUN 14 14 14 17 16 17   CREATININE 0.45 0.42* 0.48 0.60 0.51 0.52  CALCIUM 9.5 9.3 8.7* 8.5* 8.5* 8.4*  MG 1.7  --   --   --   --   --    Liver Function Tests:  Recent Labs Lab 02/22/16 1155  AST 39  ALT 27  ALKPHOS 78  BILITOT 1.1  PROT 5.1*   ALBUMIN 2.9*   No results for input(s): LIPASE, AMYLASE in the last 168 hours.  Recent Labs Lab 02/22/16 1155  AMMONIA 42*   CBC:  Recent Labs Lab 02/22/16 1155  02/25/16 0410 02/26/16 0810 02/27/16 0323 02/28/16 0340 02/29/16 0534  WBC 2.0*  < > 1.5* 2.1* 1.8* 1.8* 2.1*  NEUTROABS 1.7  --  1.2*  --  1.4*  --  1.6*  HGB 10.6*  < > 9.1* 8.9* 9.3* 9.1* 9.2*  HCT 32.7*  < > 28.0* 26.5* 28.6* 28.0* 28.2*  MCV 114.3*  < > 113.4* 108.2* 113.9* 113.8* 115.6*  PLT 24*  < > 25* 29* 31* 26* 25*  < > = values in this interval not displayed. Cardiac Enzymes: No results for input(s): CKTOTAL, CKMB, CKMBINDEX, TROPONINI in the last 168 hours. BNP (  last 3 results)  Recent Labs  05/29/15 1521  BNP 29.0    ProBNP (last 3 results) No results for input(s): PROBNP in the last 8760 hours.  CBG: No results for input(s): GLUCAP in the last 168 hours.  Recent Results (from the past 240 hour(s))  Urine culture     Status: None   Collection Time: 02/22/16  3:13 PM  Result Value Ref Range Status   Specimen Description URINE, CLEAN CATCH  Final   Special Requests NONE  Final   Culture   Final    6,000 COLONIES/mL INSIGNIFICANT GROWTH Performed at [facility]    Report Status 02/23/2016 FINAL  Final     Studies: No results found.  Scheduled Meds:  Scheduled Meds: . sodium chloride   Intravenous Once  . acidophilus  1 capsule Oral q morning - 10a  . dexamethasone  4 mg Oral 4 times per day  . enoxaparin  100 mg Subcutaneous Q24H  . escitalopram  20 mg Oral Daily  . feeding supplement (PRO-STAT SUGAR FREE 64)  30 mL Oral TID  . fluconazole  100 mg Oral Daily  . magic mouthwash  10 mL Oral TID  . methadone  2.5 mg Oral 3 times per day  . methylphenidate  5 mg Oral BID WC  . metoCLOPramide  10 mg Oral TID AC & HS  . montelukast  10 mg Oral QHS  . multivitamin with minerals  1 tablet Oral q morning - 10a  . pantoprazole  40 mg Oral Daily  . sodium chloride flush  3  mL Intravenous Q12H  . valACYclovir  1,000 mg Oral Daily   Continuous Infusions: . sodium chloride 100 mL/hr at 02/28/16 2133    Time spent on care of this patient: 25 min   Kelvin Cellar, MD 02/29/2016, 8:14 AM  LOS: 7 days   [facility] Office  [phone] Pager - Text Page per [email or web address] If 7PM-7AM, please contact night-coverage [email or web address]

## 2016-03-01 ENCOUNTER — Other Ambulatory Visit: Payer: Self-pay | Admitting: Oncology

## 2016-03-01 DIAGNOSIS — Z923 Personal history of irradiation: Secondary | ICD-10-CM

## 2016-03-01 LAB — BASIC METABOLIC PANEL
Anion gap: 3 — ABNORMAL LOW (ref 5–15)
BUN: 14 mg/dL (ref 6–20)
CHLORIDE: 109 mmol/L (ref 101–111)
CO2: 26 mmol/L (ref 22–32)
Calcium: 8.1 mg/dL — ABNORMAL LOW (ref 8.9–10.3)
Creatinine, Ser: 0.57 mg/dL (ref 0.44–1.00)
GFR calc non Af Amer: >60 mL/min (ref >60)
Glucose, Bld: 106 mg/dL — ABNORMAL HIGH (ref 65–99)
POTASSIUM: 3 mmol/L — AB (ref 3.5–5.1)
SODIUM: 138 mmol/L (ref 135–145)

## 2016-03-01 LAB — CBC
HEMATOCRIT: 28.1 % — AB (ref 36.0–46.0)
HEMOGLOBIN: 9.2 g/dL — AB (ref 12.0–15.0)
MCH: 37.7 pg — AB (ref 26.0–34.0)
MCHC: 32.7 g/dL (ref 30.0–36.0)
MCV: 115.2 fL — ABNORMAL HIGH (ref 78.0–100.0)
Platelets: 23 10*3/uL — CL (ref 150–400)
RBC: 2.44 MIL/uL — AB (ref 3.87–5.11)
RDW: 24.3 % — ABNORMAL HIGH (ref 11.5–15.5)
WBC: 3.6 10*3/uL — ABNORMAL LOW (ref 4.0–10.5)

## 2016-03-01 LAB — PREALBUMIN: PREALBUMIN: 10.2 mg/dL — AB (ref 18–38)

## 2016-03-01 MED ORDER — DEXAMETHASONE SODIUM PHOSPHATE 10 MG/ML IJ SOLN
20.0000 mg | Freq: Once | INTRAMUSCULAR | Status: AC
  Administered 2016-03-01: 20 mg via INTRAVENOUS
  Filled 2016-03-01: qty 2

## 2016-03-01 MED ORDER — METHYLPHENIDATE HCL 5 MG PO TABS: 10.0000 mg | ORAL_TABLET | Freq: Two times a day (BID) | ORAL | Status: DC

## 2016-03-01 MED ORDER — MEGESTROL ACETATE 40 MG/ML PO SUSP
400.0000 mg | Freq: Two times a day (BID) | ORAL | Status: DC
  Filled 2016-03-01 (×4): qty 10

## 2016-03-01 MED ORDER — POTASSIUM CHLORIDE 10 MEQ/100ML IV SOLN
10.0000 meq | INTRAVENOUS | Status: DC
  Administered 2016-03-01: 10 meq via INTRAVENOUS
  Filled 2016-03-01: qty 100

## 2016-03-01 MED ORDER — POTASSIUM CHLORIDE 10 MEQ/50ML IV SOLN
10.0000 meq | INTRAVENOUS | Status: AC
  Administered 2016-03-01 (×3): 10 meq via INTRAVENOUS
  Filled 2016-03-01 (×3): qty 50

## 2016-03-01 NOTE — Progress Notes (Signed)
[name] has completed her stereotactic radiation therapy. She had her third dose yesterday. Unfortunately, she just really does not seem much better to me. She still seems lethargic. She is not eating much. It is hard to say how much pain she is in. She really is not out of bed. Her labs show her platelet count to be a little bit lower. She has a platelet count of 23,000. Her hemoglobin is 9.2. Her white cell count is 3.6. Her potassium is 3.0.  I think it might be helpful to get a prealbumin on her to see what that looks like. It would not surprise me if this is less than 10. Her vital signs look about the same. Her blood pressure is 143/90. Her temperature is 90.8. Pulse is 89. Her head and neck exam shows some difficulty opening the right eye. There is no oral lesions. She has no adenopathy in her neck. Lungs are clear. Cardiac exam tachycardic but regular. Abdomen is soft. Bowel sounds are present. There is no guarding or rebound tenderness. Extremities shows spontaneous movement of her arms and legs. There may be some slight weakness over on the right side. There is maybe some trace edema in her legs.  With her potassium being low, I think this has to be replaced.  I just don't think she is able to go home yet.  Her regular oncologist, [name], should be back tomorrow. I think he would definitely be in a better position to talk with the family regarding additional therapy if indicated. I know that she had been on Navelbine. Her platelet count is quite low. Again I probably would not transfuse her. She is on Lovenox.  I think the real issue is her not eating. This, I think, is going to really be the determinant of her prognosis.  She is very nice. I just feel bad for her.  Pete E.  Isaiah 43:2-3

## 2016-03-01 NOTE — Telephone Encounter (Signed)
Chart reviewed.

## 2016-03-01 NOTE — Progress Notes (Signed)
I stopped in to see the patient this morning. She finished SRS to the brain yesterday. Steroids continue at 4mg  QID. Pt is awaiting [name] return to discuss goals of care and future therapy. Friend at bedside uncertain of any clinical improvement in weakness at this time. Pt is resting comfortably. We will follow up as an outpatient, and as needed.  Carola Rhine, PAC

## 2016-03-01 NOTE — Progress Notes (Signed)
[facility] Progress Note   Kristin Hoffman  [id number]  DOB: 07/13/1966  DOA: 02/22/2016 PCP: Drake Leach, MD  Brief narrative: Kristin Hoffman is a 50 y.o. female with metastatic breast cancer, ventricular thrombus, pancytopenia presents for slurred speech, left facial droop and weakness on the left side. Symptoms started about 5 days ago and haven't progressed. Initially only her speech was slurred and there was a facial droop now she is weak on the left side and having difficulty ambulating. Imaging in the ER revealed brain metastases with impending herniation. Medical oncology, radiation oncology, neurosurgery consulted. During this hospitalization she completed 3 stereotactic radiosurgery treatments. Unfortunately functional status remains poor. She continues to have issues with nausea and by mouth intake. Prealbumin level 10.2. Palliative care was consulted for assistance with symptom management.                                                          Subjective: She appears a little more sleepy this morning, family at bedside reporting poor oral intake.   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 -She presented with left facial, arm/ leg weakness with midline shift and severe vasogenic edema- symptoms improving with Decadron -CT scan of brain without contrast performed on 02/22/2016 showed extensive vasogenic edema on the right causing effacement of the right lateral ventricle with 7 mm midline shift -MRI of brain performed on 02/22/2016 showing 2 enhancing masses within the right atmosphere with extensive surrounding vasogenic edema. Micro-hemorrhages present in both lesions. -During this hospitalization she had 3 treatments of SRS, a last tx on 02/29/2016. -Continues to have  poor functional status, ongoing nausea, minimal by mouth intake. -Palliative care consulted for assistance with symptomatic management.  Nausea -She continues to have minimal oral intake as nausea continues to be an issue. I suspect that brain metastasis are contributing to symptoms. 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 -On 02/29/2016 lab work showed a white count of 2.1 with neutrophil count of 1.6. Platelet count remains at 25,000. -She received colony-stimulating factor  Weight loss/ Malnutrition  - cont Prostat as ordered by nutritionist -Pre-albumin level of 10.2  Mouth sores - magic mouthwash   Thrombocytopenia  -Platelet count trending down to 23,000 on 03/01/2016   Ventricular mural thrombus - Dr Jana Hakim last recommended to continue Lovenox despite her low platelets as the benefit outweighed the risk- Dr Marin Olp agrees with this plan -Continue Lovenox 100 mg subcutaneous every 24 hours -Does not have clinical evidence of bleed  Goals of Care  I spoke to Dr Alvy Bimler of Medical oncology who felt a consultation to palliative care would be appropriate. I agree with her. She has advanced disease and would benefit from symptom management.   Antibiotics: Anti-infectives    Start     Dose/Rate Route Frequency Ordered Stop   02/23/16 1000  fluconazole (DIFLUCAN) tablet 100 mg     100 mg Oral Daily 02/23/16 0722     02/22/16 1800  valACYclovir (VALTREX) tablet 1,000 mg     1,000 mg  Oral Daily 02/22/16 1652       Code Status: -she states she wants to be a full code and if afterwards she is not able to be functional without life support, she would like her family to remove her from life support    Code Status Orders        Start     Ordered   02/22/16 1728  Full code   Continuous     02/22/16 1728    Code Status  History    Date Active Date Inactive Code Status Order ID Comments User Context   05/29/2015  9:51 PM 05/30/2015  5:54 PM Full Code [id number]  Toy Baker, MD Inpatient   12/17/2014  2:39 PM 12/18/2014  3:42 AM Full Code [id number]  Azzie Roup, MD [place]   11/08/2012  7:19 PM 11/12/2012  9:42 PM Full Code [id number]  Kathalene Frames, MD ED     Family Communication: husband Disposition Plan: home when she feels better- can come for radiation treatments from home DVT prophylaxis: Lovenox Consultants: Onc- medical and radiation, Neurosurgery Procedures:     Objective: Filed Weights   02/22/16 1043  Weight: 73.936 kg (163 lb)    Intake/Output Summary (Last 24 hours) at 03/01/16 1355 Last data filed at 03/01/16 0700  Gross per 24 hour  Intake   2400 ml  Output      0 ml  Net   2400 ml     Vitals Filed Vitals:   02/29/16 0650 02/29/16 1430 02/29/16 2203 03/01/16 0432  BP: 153/91 146/92 149/97 143/90  Pulse: 82 108 106 89  Temp: 98.9 F (37.2 C) 98 F (36.7 C) 98.3 F (36.8 C) 98 F (36.7 C)  TempSrc: Oral Oral Oral Oral  Resp: 20 20 20 20   Height:      Weight:      SpO2: 92% 98% 98% 97%    Exam:  General:  She appears lethargic, however is arousable. Little improvement from yesterday's exam  HEENT: No icterus, No thrush, oral mucosa moist- pupils dilated  Cardiovascular: regular rate and rhythm, S1/S2 No murmur  Respiratory: clear to auscultation bilaterally   Abdomen: Vomiting-  Soft, +Bowel sounds, non tender, non distended, no guarding  MSK: No cyanosis or clubbing- no pedal edema  Neuro: facial droop resolved, strength still 4/5 in left arm and leg   Data Reviewed: Basic Metabolic Panel:  Recent Labs Lab 02/25/16 0410 02/26/16 0810 02/27/16 0323 02/28/16 0340 03/01/16 0430  NA 138 141 139 139 138  K 3.4* 3.4* 3.4* 3.4* 3.0*  CL 101 107 105 108 109  CO2 31 27 27 26 26   GLUCOSE 140* 174* 162* 161* 106*  BUN 14 17 16 17 14   CREATININE 0.48 0.60  0.51 0.52 0.57  CALCIUM 8.7* 8.5* 8.5* 8.4* 8.1*   Liver Function Tests: No results for input(s): AST, ALT, ALKPHOS, BILITOT, PROT, ALBUMIN in the last 168 hours. No results for input(s): LIPASE, AMYLASE in the last 168 hours. No results for input(s): AMMONIA in the last 168 hours. CBC:  Recent Labs Lab 02/25/16 0410 02/26/16 0810 02/27/16 0323 02/28/16 0340 02/29/16 0534 03/01/16 0430  WBC 1.5* 2.1* 1.8* 1.8* 2.1* 3.6*  NEUTROABS 1.2*  --  1.4*  --  1.6*  --   HGB 9.1* 8.9* 9.3* 9.1* 9.2* 9.2*  HCT 28.0* 26.5* 28.6* 28.0* 28.2* 28.1*  MCV 113.4* 108.2* 113.9* 113.8* 115.6* 115.2*  PLT 25* 29* 31* 26* 25* 23*   Cardiac Enzymes: No  results for input(s): CKTOTAL, CKMB, CKMBINDEX, TROPONINI in the last 168 hours. BNP (last 3 results)  Recent Labs  05/29/15 1521  BNP 29.0    ProBNP (last 3 results) No results for input(s): PROBNP in the last 8760 hours.  CBG: No results for input(s): GLUCAP in the last 168 hours.  Recent Results (from the past 240 hour(s))  Urine culture     Status: None   Collection Time: 02/22/16  3:13 PM  Result Value Ref Range Status   Specimen Description URINE, CLEAN CATCH  Final   Special Requests NONE  Final   Culture   Final    6,000 COLONIES/mL INSIGNIFICANT GROWTH Performed at [facility]    Report Status 02/23/2016 FINAL  Final     Studies: No results found.  Scheduled Meds:  Scheduled Meds: . sodium chloride   Intravenous Once  . acidophilus  1 capsule Oral q morning - 10a  . dexamethasone  4 mg Oral 4 times per day  . enoxaparin  100 mg Subcutaneous Q24H  . escitalopram  20 mg Oral Daily  . feeding supplement (PRO-STAT SUGAR FREE 64)  30 mL Oral TID  . fluconazole  100 mg Oral Daily  . magic mouthwash  10 mL Oral TID  . megestrol  400 mg Oral BID  . methadone  2.5 mg Oral 3 times per day  . methylphenidate  10 mg Oral BID WC  . metoCLOPramide (REGLAN) injection  10 mg Intravenous TID AC & HS   Or  .  metoCLOPramide  10 mg Oral TID AC & HS  . montelukast  10 mg Oral QHS  . multivitamin with minerals  1 tablet Oral q morning - 10a  . pantoprazole  40 mg Oral Daily  . sodium chloride flush  3 mL Intravenous Q12H  . valACYclovir  1,000 mg Oral Daily   Continuous Infusions: . sodium chloride 100 mL/hr at 03/01/16 0352    Time spent on care of this patient: 25 min   Kelvin Cellar, MD 03/01/2016, 1:55 PM  LOS: 8 days   [facility] Office  [phone] Pager - Text Page per [email or web address] If 7PM-7AM, please contact night-coverage [email or web address]

## 2016-03-01 NOTE — Consult Note (Signed)
Consultation Note Date: 03/01/2016   Patient Name: Kristin Hoffman  DOB: 30-Dec-1965  MRN: [id number]  Age / Sex: 50 y.o., female  PCP: Drake Leach, MD Referring Physician: Kelvin Cellar, MD  Reason for Consultation: Non pain symptom management  Clinical Assessment/Narrative: [name] is a 50 year old female with with metastatic breast cancer, ventricular thrombus, pancytopenia presented for slurred speech, left facial droop and weakness on the left side. Symptoms started about 5 days prior to admission. Speech was slurred and there was a facial droop now she is weak on the left side and having difficulty ambulating. Imaging in the ER revealed brain metastases.  She is on steroids and s/p STRS.  Palliative consulted for symptoms management.  I saw Ms Hoffman following Arlee Muslim today.  She had received ativan and was still sleepy.  I talked with her family and they report that the main problem that she has been having has been with continued nausea.  Currently she is getting reglan and zofran.  Dr. Marin Olp has transitioned her to PO regimen today.  Her family is concerned about the fact that she is too sleepy to take PO meds right now.  Request her reglan be IV for the night as she feels better with this med and cannot currently swallow.   Contacts/Participants in [place], father, mother, several friends Tax adviser: patient and her husband HCPOA: none on chart  SUMMARY OF RECOMMENDATIONS - Kristin Hoffman is currently sleepy from medications she received during radiation therapy. - Will add back IV reglan in case she remains too sleepy to take PO this evening. - Dr Marin Olp modified regimen this AM, and will wait to see effects of this prior to any additional recs - [name] will return on Wednesday.  Plan to await his return to evaluate situation.  Our team would be happy to participate in any future  goals of care planning or conversation once he returns.   Code Status/Advance Care Planning: Full code    Code Status Orders        Start     Ordered   02/22/16 1728  Full code   Continuous     02/22/16 1728    Code Status History    Date Active Date Inactive Code Status Order ID Comments User Context   05/29/2015  9:51 PM 05/30/2015  5:54 PM Full Code [id number]  Toy Baker, MD Inpatient   12/17/2014  2:39 PM 12/18/2014  3:42 AM Full Code [id number]  Azzie Roup, MD [place]   11/08/2012  7:19 PM 11/12/2012  9:42 PM Full Code [id number]  Kathalene Frames, MD ED      Other Directives:None  Symptom Management:   Nausea: Likely secondary to intracranial metastasis.  She is on steroids as well as receiving radiation.  Dr. Marin Olp adjusted regimen this AM, will wait to evaluate response to these changes prior to further recs.   Poor appetite: Recommend aggressive control of nausea, bowel regimen as I suspect that there is still component of constipation.  Could consider zyprexa, but has been on high dose other dopamanergic agents (reglan).  Will continue to reassess.  Palliative Prophylaxis:   Aspiration, Bowel Regimen and Frequent Pain Assessment  Psycho-social/Spiritual:  Support System: Strong Desire for further Chaplaincy support:Did not address this visit Additional Recommendations: Caregiving  Support/Resources  Prognosis: Unable to determine  Discharge Planning: To be determined   Chief Complaint/ Primary Diagnoses: Present on Admission:  . Brain metastasis ([place]) . Breast cancer metastasized to  multiple sites ([place]) . Thrombocytopenia ([place]) . Ventricular mural thrombus ([place])  I have reviewed the medical record, interviewed the patient and family, and examined the patient. The following aspects are pertinent.  Past Medical History  Diagnosis Date  . Depression   . Reflux   . Hx-TIA (transient ischemic attack) 11/22/2013  . Chronic headaches 11/22/2013  . Anxiety  11/22/2013  . Breast cancer ([place])   . Cancer ([place])   . Breast cancer metastasized to multiple sites [facility]) 12/24/2013   Social History   Social History  . Marital Status: Married    Spouse Name: N/A  . Number of Children: N/A  . Years of Education: N/A   Social History Main Topics  . Smoking status: Never Smoker   . Smokeless tobacco: Never Used  . Alcohol Use: Yes     Comment: rarely  . Drug Use: No  . Sexual Activity: Yes    Birth Control/ Protection: Surgical   Other Topics Concern  . None   Social History Narrative   Family History  Problem Relation Age of Onset  . Diabetes Mellitus II Neg Hx   . Hypertension Neg Hx    Scheduled Meds: . sodium chloride   Intravenous Once  . acidophilus  1 capsule Oral q morning - 10a  . dexamethasone  4 mg Oral 4 times per day  . enoxaparin  100 mg Subcutaneous Q24H  . escitalopram  20 mg Oral Daily  . feeding supplement (PRO-STAT SUGAR FREE 64)  30 mL Oral TID  . fluconazole  100 mg Oral Daily  . magic mouthwash  10 mL Oral TID  . megestrol  400 mg Oral BID  . methadone  2.5 mg Oral 3 times per day  . methylphenidate  10 mg Oral BID WC  . metoCLOPramide (REGLAN) injection  10 mg Intravenous TID AC & HS   Or  . metoCLOPramide  10 mg Oral TID AC & HS  . montelukast  10 mg Oral QHS  . multivitamin with minerals  1 tablet Oral q morning - 10a  . pantoprazole  40 mg Oral Daily  . potassium chloride  10 mEq Intravenous Q1 Hr x 3  . sodium chloride flush  3 mL Intravenous Q12H  . valACYclovir  1,000 mg Oral Daily   Continuous Infusions: . sodium chloride 100 mL/hr at 03/01/16 0352   PRN Meds:.acetaminophen **OR** acetaminophen, feeding supplement (ENSURE ENLIVE), HYDROcodone-acetaminophen, LORazepam, morphine injection, ondansetron **OR** ondansetron (ZOFRAN) IV, sodium chloride flush Medications Prior to Admission:  Prior to Admission medications   Medication Sig Start Date End Date Taking? Authorizing Provider  acidophilus  (RISAQUAD) CAPS capsule Take 1 capsule by mouth every morning.    Yes Historical Provider, MD  Biotin 5000 MCG CAPS Take 5,000 mcg by mouth every morning.  02/27/15  Yes Chauncey Cruel, MD  cetirizine (ZYRTEC) 10 MG tablet Take 10 mg by mouth at bedtime.    Yes Historical Provider, MD  Cholecalciferol 2000 UNITS CHEW Chew 1 tablet by mouth daily with breakfast.    Yes Historical Provider, MD  diphenoxylate-atropine (LOMOTIL) 2.5-0.025 MG tablet Take 1-2 tablets by mouth 4 (four) times daily as needed. 12/17/15  Yes Historical Provider, MD  enoxaparin (LOVENOX) 100 MG/ML injection Inject 1 mL (100 mg total) into the skin daily. 02/09/16  Yes Chauncey Cruel, MD  escitalopram (LEXAPRO) 20 MG tablet Take 1 tablet (20 mg total) by mouth daily. 06/16/15  Yes Chauncey Cruel, MD  LORazepam (ATIVAN) 0.5 MG  tablet Take one tablet at bedtime as needed for sleep Patient taking differently: Take 0.5 mg by mouth at bedtime as needed for sleep.  01/05/16  Yes Chauncey Cruel, MD  Melatonin 10 MG TABS Take 1 tablet by mouth at bedtime. Reported on 02/22/2016   Yes Historical Provider, MD  methadone (DOLOPHINE) 5 MG tablet Take 1 tablet (5 mg total) by mouth every 8 (eight) hours. Patient taking differently: Take 2.5 mg by mouth every 8 (eight) hours.  02/03/16  Yes Chauncey Cruel, MD  methylphenidate (RITALIN) 5 MG tablet Take one tablet with breakfast and one tablet with lunch Patient taking differently: Take 5 mg by mouth 2 (two) times daily with breakfast and lunch. Take one tablet with breakfast and one tablet with lunch 02/02/16  Yes Chauncey Cruel, MD  montelukast (SINGULAIR) 10 MG tablet Take 10 mg by mouth at bedtime.    Yes Historical Provider, MD  Multiple Vitamin (MULTIVITAMIN) tablet Take 1 tablet by mouth every morning.    Yes Historical Provider, MD  nystatin (MYCOSTATIN) 100000 UNIT/ML suspension Take 5 mLs (500,000 Units total) by mouth 4 (four) times daily. Patient taking differently: Take 5  mLs by mouth daily.  04/10/15  Yes Chauncey Cruel, MD  omeprazole (PRILOSEC) 40 MG capsule Take 1 capsule (40 mg total) by mouth every morning. 02/03/16  Yes Chauncey Cruel, MD  ondansetron (ZOFRAN) 8 MG tablet TAKE 1 TABLET BY MOUTH 2 TIMES DAILY. START DAY AFTER CHEMO FOR 2 DAYS. THEN TAKE AS NEEDED NAUSEA 09/15/15  Yes Chauncey Cruel, MD  prochlorperazine (COMPAZINE) 10 MG tablet Take 10 mg by mouth every 6 (six) hours as needed for nausea or vomiting. Reported on 02/16/2016 12/13/14  Yes Historical Provider, MD  traMADol (ULTRAM) 50 MG tablet Take 1-2 tablets (50-100 mg total) by mouth every 6 (six) hours as needed. Patient taking differently: Take 50-100 mg by mouth every 6 (six) hours as needed for moderate pain.  02/02/16  Yes Chauncey Cruel, MD  valACYclovir (VALTREX) 1000 MG tablet Take 1 tablet (1,000 mg total) by mouth daily. 11/24/15  Yes Chauncey Cruel, MD   Allergies  Allergen Reactions  . Afinitor [Everolimus] Hives and Itching    Review of Systems  Unable to obtain  Physical Exam  General: Ill appearing female lying in bed. Slept throughout encounter other than one or two words.  Did not open eyes, in no acute distress.  HEENT: No bruits, no goiter, no JVD Heart: Regular rate and rhythm. No murmur appreciated. Lungs: Good air movement, clear Abdomen: Soft, nontender, nondistended, positive bowel sounds.  Ext: No significant edema Skin: Warm and dry Neuro: Unable to assess   Vital Signs: BP 143/90 mmHg  Pulse 89  Temp(Src) 98 F (36.7 C) (Oral)  Resp 20  Ht 5\' 3"  (1.6 m)  Wt 73.936 kg (163 lb)  BMI 28.88 kg/m2  SpO2 97%  SpO2: SpO2: 97 % O2 Device:SpO2: 97 % O2 Flow Rate: .   IO: Intake/output summary:  Intake/Output Summary (Last 24 hours) at 03/01/16 0958 Last data filed at 03/01/16 0700  Gross per 24 hour  Intake   2520 ml  Output      0 ml  Net   2520 ml    LBM: Last BM Date: 02/29/16 Baseline Weight: Weight: 73.936 kg (163 lb) Most  recent weight: Weight: 73.936 kg (163 lb)      Palliative Assessment/Data:    Additional Data Reviewed:  CBC:  Component Value Date/Time   WBC 3.6* 03/01/2016 0430   WBC 3.0* 02/16/2016 1217   HGB 9.2* 03/01/2016 0430   HGB 9.9* 02/16/2016 1217   HCT 28.1* 03/01/2016 0430   HCT 30.8* 02/16/2016 1217   PLT 23* 03/01/2016 0430   PLT 44* 02/16/2016 1217   MCV 115.2* 03/01/2016 0430   MCV 111.3* 02/16/2016 1217   NEUTROABS 1.6* 02/29/2016 0534   NEUTROABS 2.1 02/16/2016 1217   LYMPHSABS 0.2* 02/29/2016 0534   LYMPHSABS 0.4* 02/16/2016 1217   MONOABS 0.3 02/29/2016 0534   MONOABS 0.6 02/16/2016 1217   EOSABS 0.0 02/29/2016 0534   EOSABS 0.0 02/16/2016 1217   BASOSABS 0.0 02/29/2016 0534   BASOSABS 0.0 02/16/2016 1217   Comprehensive Metabolic Panel:    Component Value Date/Time   NA 138 03/01/2016 0430   NA 141 02/16/2016 1217   K 3.0* 03/01/2016 0430   K 3.5 02/16/2016 1217   CL 109 03/01/2016 0430   CL 104 11/14/2012 1407   CO2 26 03/01/2016 0430   CO2 29 02/16/2016 1217   BUN 14 03/01/2016 0430   BUN 8.5 02/16/2016 1217   CREATININE 0.57 03/01/2016 0430   CREATININE 0.7 02/16/2016 1217   GLUCOSE 106* 03/01/2016 0430   GLUCOSE 127 02/16/2016 1217   GLUCOSE 108* 11/14/2012 1407   CALCIUM 8.1* 03/01/2016 0430   CALCIUM 8.2* 02/16/2016 1217   AST 39 02/22/2016 1155   AST 24 02/16/2016 1217   ALT 27 02/22/2016 1155   ALT 12 02/16/2016 1217   ALKPHOS 78 02/22/2016 1155   ALKPHOS 105 02/16/2016 1217   BILITOT 1.1 02/22/2016 1155   BILITOT 1.07 02/16/2016 1217   PROT 5.1* 02/22/2016 1155   PROT 4.8* 02/16/2016 1217   ALBUMIN 2.9* 02/22/2016 1155   ALBUMIN 2.7* 02/16/2016 1217     Time In: 1740 Time Out: 1820 Time Total: 40 Greater than 50%  of this time was spent counseling and coordinating care related to the above assessment and plan.  Signed by: Micheline Rough, MD  Micheline Rough, MD  03/01/2016, 9:58 AM  Please contact Palliative Medicine Team phone  at [phone] for questions and concerns.

## 2016-03-02 ENCOUNTER — Other Ambulatory Visit: Payer: Self-pay | Admitting: Nurse Practitioner

## 2016-03-02 DIAGNOSIS — R0602 Shortness of breath: Secondary | ICD-10-CM

## 2016-03-02 DIAGNOSIS — C770 Secondary and unspecified malignant neoplasm of lymph nodes of head, face and neck: Secondary | ICD-10-CM

## 2016-03-02 DIAGNOSIS — C50312 Malignant neoplasm of lower-inner quadrant of left female breast: Secondary | ICD-10-CM

## 2016-03-02 DIAGNOSIS — C787 Secondary malignant neoplasm of liver and intrahepatic bile duct: Secondary | ICD-10-CM

## 2016-03-02 DIAGNOSIS — E43 Unspecified severe protein-calorie malnutrition: Secondary | ICD-10-CM

## 2016-03-02 DIAGNOSIS — Z66 Do not resuscitate: Secondary | ICD-10-CM

## 2016-03-02 DIAGNOSIS — R197 Diarrhea, unspecified: Secondary | ICD-10-CM

## 2016-03-02 LAB — BASIC METABOLIC PANEL
ANION GAP: 6 (ref 5–15)
BUN: 12 mg/dL (ref 6–20)
CALCIUM: 8.2 mg/dL — AB (ref 8.9–10.3)
CO2: 24 mmol/L (ref 22–32)
Chloride: 106 mmol/L (ref 101–111)
Creatinine, Ser: 0.41 mg/dL — ABNORMAL LOW (ref 0.44–1.00)
Glucose, Bld: 77 mg/dL (ref 65–99)
Potassium: 3.1 mmol/L — ABNORMAL LOW (ref 3.5–5.1)
Sodium: 136 mmol/L (ref 135–145)

## 2016-03-02 LAB — CBC
HCT: 27.9 % — ABNORMAL LOW (ref 36.0–46.0)
HEMOGLOBIN: 9.3 g/dL — AB (ref 12.0–15.0)
MCH: 38.3 pg — ABNORMAL HIGH (ref 26.0–34.0)
MCHC: 33.3 g/dL (ref 30.0–36.0)
MCV: 114.8 fL — ABNORMAL HIGH (ref 78.0–100.0)
Platelets: 24 10*3/uL — CL (ref 150–400)
RBC: 2.43 MIL/uL — AB (ref 3.87–5.11)
RDW: 25.4 % — ABNORMAL HIGH (ref 11.5–15.5)
WBC: 4.3 10*3/uL (ref 4.0–10.5)

## 2016-03-02 MED ORDER — TRAMADOL HCL 50 MG PO TABS: 50.0000 mg | ORAL_TABLET | Freq: Four times a day (QID) | ORAL | Status: DC | PRN

## 2016-03-02 MED ORDER — BOOST / RESOURCE BREEZE PO LIQD
1.0000 | Freq: Three times a day (TID) | ORAL | Status: DC
  Administered 2016-03-02: 1 via ORAL

## 2016-03-02 MED ORDER — POTASSIUM CHLORIDE 10 MEQ/100ML IV SOLN
10.0000 meq | INTRAVENOUS | Status: AC
  Administered 2016-03-02 (×4): 10 meq via INTRAVENOUS
  Filled 2016-03-02: qty 100

## 2016-03-02 MED ORDER — MORPHINE SULFATE (PF) 2 MG/ML IV SOLN
1.0000 mg | INTRAVENOUS | Status: DC | PRN
Start: 2016-03-02 — End: 2016-03-05
  Administered 2016-03-02 – 2016-03-05 (×8): 1 mg via INTRAVENOUS
  Filled 2016-03-02 (×8): qty 1

## 2016-03-02 NOTE — Progress Notes (Signed)
ID: Kristin Hoffman OB: 1966-07-04  MR#: [id number]  WFU#:[id number]  PCP: Drake Leach, MD GYN:  [name] SU: [name] OTHER MD: [name], [name]  CHIEF COMPLAINT:  Stage IV breast cancer  CURRENT TREATMENT: lovenox   INTERVAL HISTORY: Kristin Hoffman presented with slurred speech and left body weakness 02/22/2016. Brain MRI showed 2 Right hemisphere lesions, 3.0 and 1.4 cm, with mass effect. She was started on steroids and underwent SRS to the lesions 02/24/2016. However clinically she has not clearly improved. The family asked me to come by and assess her chances for significant recovery and clarify discharge options   REVIEW OF SYSTEMS: Kristin Hoffman was intermittently alert but she becomes very sleepy with pain medication-- at home she was on a low-dose methadone regimen with ritalin and that has been written for here as well but she has had difficulties swallowing and the morphine ativan and norco tend to "knock her out." She had significant diarrhea today after taking in some Boost. Pain is moderately controlled. Unable to give me a more detailed account today (most of above was from husband). She does say "I want to go home."  BREAST CANCER HISTORY: This patient was previously followed by [name], and was transferred to [name] service as of 08/20/2013. From a prior summary:  At the age of 68, the patient had a screening mammogram as a baseline. She had recently given birth and was on birth control pills at that time. The mammogram in November 2006 showed an area of architectural distortion in the left lower inner quadrant. Subsequently an ultrasound was obtained of the left breast showing a 2.0 x 1.5 x 1.4 cm mass, at the 8:00 position, 4 cm from the nipple. A core biopsy performed on 10/21/2005 showed an invasive mammary carcinoma, ER +70%, PR +41%, HER-2/neu negative, with proliferation marker of 38%. [phone])  Breast MRI on 11/08/2005 confirmed a 2.5 cm  spiculated mass in the left lower inner quadrant with 3 small satellite nodules adjacent to the primary mass: 3.5 cm posterior medial to the primary mass, measuring 9 mm; 1.5 cm anterior medial to the primary mass measuring 5 mm; and 2 cm medial to the primary mass measuring 5 mm. No axillary adenopathy was noted. No suspicious masses or enhancement are noted in the right breast.  Kristin Hoffman underwentt left lumpectomy under the care of Dr. 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 2 of 23 lymph nodes were involved.  Margins were clear.  She received adjuvant chemotherapy consisting of 6 q. three-week doses of docetaxel/doxorubicin/cyclophosphamide given between January 2007 and may of 2007, with last dose on 04/20/2006. She underwent radiation therapy completed 07/11/2006, after which she began on tamoxifen in early August 2007.  She underwent hysterectomy with bilateral salpingo-oophorectomy on 07/15/2008, and was started on letrozole in October of 2009.  A mammogram 11/10/2009 showed microcalcifications in the left breast, and a subsequent biopsy on 11/18/2009 confirmed invasive mammary carcinoma in the left breast. PET and CT of the chest showed no evidence of metastasis in January 2011.  She underwent bilateral mastectomies 01/25/2010, with the right breast showing no evidence of malignancy; in the left breast showing a 1.0 cm grade 2 invasive ductal carcinoma with high-grade DCIS. Tumor was ER +22%, PR negative, HER-2/neu negative, with proliferation marker of 13%. Margins were clear. Patient underwent concurrent left latissimus flap reconstruction and right implant reconstruction.  She  received additional chemotherapy with one cycle of carboplatin/gemcitabine given on 03/11/2010. She then received 5 q. three-week cycles of IV CMF between 03/25/2010 and 06/17/2010.  She was started on exemestane in January 2012  and continued until late November 2013 when she was hospitalized for apparent TIA.   Her subsequent history is as detailed below PAST MEDICaL HISTORY: Past Medical History  Diagnosis Date  . Depression   . Reflux   . Hx-TIA (transient ischemic attack) 11/22/2013  . Chronic headaches 11/22/2013  . Anxiety 11/22/2013  . Breast cancer ([place])   . Cancer ([place])   . Breast cancer metastasized to multiple sites [facility]) 12/24/2013    PAST SURGICAL HISTORY: Past Surgical History  Procedure Laterality Date  . Mastectomy w/ nodes partial    . Mastectomy    . Breast reconstruction    . Portacath placement      x 2  . Portacath removal      x 2  . Abdominal hysterectomy    . Tee without cardioversion  11/12/2012    Procedure: TRANSESOPHAGEAL ECHOCARDIOGRAM (TEE);  Surgeon: Candee Furbish, MD;  Location: [facility] ENDOSCOPY;  Service: Cardiovascular;  Laterality: N/A;  This TEE may be Dr. Marlou Porch or a LHC Dr    FAMILY HISTORY Both parents are alive and well. Patient has one sister who is 37 years younger and is in good health. No other history of breast or ovarian cancer in the family. Family History  Problem Relation Age of Onset  . Diabetes Mellitus II Neg Hx   . Hypertension Neg Hx     GYNECOLOGIC HISTORY:   (Updated January 2015) G2P2, menarche at age 58 with irregular menses. On birth control pills in the past. On Clomid to induce ovulation with first pregnancy. Also had preeclampsia with first pregnancy. Status post hysterectomy and bilateral salpingo-oophorectomy in August 2009.  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 She is originally from [place]. She's been married to Kristin Hoffman, for 13 years. He works as an Pharmacist, hospital at [employer].   ADVANCED DIRECTIVES: DNR order placed 03/02/2016   HEALTH MAINTENANCE:  (Updated 11/22/2013) Social History  Substance Use Topics  . Smoking status: Never  Smoker   . Smokeless tobacco: Never Used  . Alcohol Use: Yes     Comment: rarely     Colonoscopy:  Never  PAP: S/P LAVH/BSO in August 2009  Bone density: Never  Lipid panel: Not on file   Allergies  Allergen Reactions  . Afinitor [Everolimus] Hives and Itching    Current Facility-Administered Medications  Medication Dose Route Frequency Provider Last Rate Last Dose  . 0.9 %  sodium chloride infusion   Intravenous Continuous Debbe Odea, MD 100 mL/hr at 03/02/16 1720 100 mL/hr at 03/02/16 1720  . 0.9 %  sodium chloride infusion   Intravenous Once Debbe Odea, MD      . acetaminophen (TYLENOL) tablet 650 mg  650 mg Oral Q6H PRN Debbe Odea, MD   650 mg at 02/23/16 1749   Or  . acetaminophen (TYLENOL) suppository 650 mg  650 mg Rectal Q6H PRN Debbe Odea, MD      . acidophilus (RISAQUAD) capsule 1 capsule  1 capsule Oral q morning - 10a Debbe Odea, MD   1 capsule at 02/23/16 1107  . dexamethasone (DECADRON) tablet 4 mg  4 mg Oral 4 times per day Volanda Napoleon, MD  4 mg at 02/29/16 1213  . enoxaparin (LOVENOX) injection 100 mg  100 mg Subcutaneous Q24H Debbe Odea, MD   100 mg at 03/02/16 1720  . escitalopram (LEXAPRO) tablet 20 mg  20 mg Oral Daily Debbe Odea, MD   20 mg at 02/29/16 1025  . feeding supplement (BOOST / RESOURCE BREEZE) liquid 1 Container  1 Container Oral TID BM Kelvin Cellar, MD   1 Container at 03/02/16 1420  . feeding supplement (ENSURE ENLIVE) (ENSURE ENLIVE) liquid 237 mL  237 mL Oral BID PRN Maricela Bo Ostheim, RD      . feeding supplement (PRO-STAT SUGAR FREE 64) liquid 30 mL  30 mL Oral TID Clayton Bibles, RD   30 mL at 02/24/16 1607  . fluconazole (DIFLUCAN) tablet 100 mg  100 mg Oral Daily Volanda Napoleon, MD   100 mg at 02/29/16 1025  . HYDROcodone-acetaminophen (NORCO/VICODIN) 5-325 MG per tablet 1-2 tablet  1-2 tablet Oral Q4H PRN Debbe Odea, MD   1 tablet at 02/24/16 2212  . LORazepam (ATIVAN) injection 0.5 mg  0.5 mg Intravenous Q4H PRN  Kelvin Cellar, MD   0.5 mg at 02/29/16 1246  . magic mouthwash  10 mL Oral TID Debbe Odea, MD   10 mL at 03/02/16 0803  . methadone (DOLOPHINE) tablet 2.5 mg  2.5 mg Oral 3 times per day Debbe Odea, MD   2.5 mg at 02/29/16 1518  . methylphenidate (RITALIN) tablet 10 mg  10 mg Oral BID WC Volanda Napoleon, MD   10 mg at 03/01/16 0800  . metoCLOPramide (REGLAN) injection 10 mg  10 mg Intravenous TID AC & HS Gene Domingo Cocking, MD   10 mg at 03/02/16 1603   Or  . metoCLOPramide (REGLAN) tablet 10 mg  10 mg Oral TID AC & HS Gene Domingo Cocking, MD      . montelukast (SINGULAIR) tablet 10 mg  10 mg Oral QHS Debbe Odea, MD   10 mg at 02/27/16 2332  . morphine 2 MG/ML injection 2 mg  2 mg Intravenous Q4H PRN Debbe Odea, MD   2 mg at 03/02/16 1603  . multivitamin with minerals tablet 1 tablet  1 tablet Oral q morning - 10a Debbe Odea, MD   1 tablet at 02/23/16 1104  . ondansetron (ZOFRAN) tablet 4 mg  4 mg Oral Q6H PRN Debbe Odea, MD       Or  . ondansetron (ZOFRAN) injection 4 mg  4 mg Intravenous Q6H PRN Debbe Odea, MD   4 mg at 02/28/16 1006  . pantoprazole (PROTONIX) EC tablet 40 mg  40 mg Oral Daily Volanda Napoleon, MD   40 mg at 02/29/16 1025  . potassium chloride 10 mEq in 100 mL IVPB  10 mEq Intravenous Q1 Hr x 4 Kelvin Cellar, MD   10 mEq at 03/02/16 1721  . sodium chloride flush (NS) 0.9 % injection 10-40 mL  10-40 mL Intracatheter PRN Debbe Odea, MD   10 mL at 03/02/16 0443  . sodium chloride flush (NS) 0.9 % injection 3 mL  3 mL Intravenous Q12H Debbe Odea, MD   3 mL at 02/25/16 1000  . valACYclovir (VALTREX) tablet 1,000 mg  1,000 mg Oral Daily Debbe Odea, MD   1,000 mg at 02/29/16 1025    OBJECTIVE: Young white woman examined in bed Filed Vitals:   03/02/16 0646 03/02/16 1530  BP: 149/91   Pulse: 105   Temp: 97.9 F (36.6 C) 98.5 F (36.9 C)  Resp:  20 18  Body mass index is 28.88 kg/(m^2).  ECOG: 3 Filed Weights   02/22/16 1043  Weight: 163 lb (73.936 kg)   Sclerae  unicteric Lungs no rales or rhonchi, auscultated anterolaterally Heart regular rate and rhythm Abd soft, nontender, positive bowel sounds Neuro: left UE paralysis, left LE weak, when awake was able to tell me place, date and president; Breasts: deferred  LAB RESULTS:  CBC Latest Ref Rng 03/02/2016 03/01/2016 02/29/2016  WBC 4.0 - 10.5 K/uL 4.3 3.6(L) 2.1(L)  Hemoglobin 12.0 - 15.0 g/dL 9.3(L) 9.2(L) 9.2(L)  Hematocrit 36.0 - 46.0 % 27.9(L) 28.1(L) 28.2(L)  Platelets 150 - 400 K/uL 24(LL) 23(LL) 25(LL)   CMP Latest Ref Rng 03/02/2016 03/01/2016 02/28/2016  Glucose 65 - 99 mg/dL 77 106(H) 161(H)  BUN 6 - 20 mg/dL 12 14 17   Creatinine 0.44 - 1.00 mg/dL 0.41(L) 0.57 0.52  Sodium 135 - 145 mmol/L 136 138 139  Potassium 3.5 - 5.1 mmol/L 3.1(L) 3.0(L) 3.4(L)  Chloride 101 - 111 mmol/L 106 109 108  CO2 22 - 32 mmol/L 24 26 26   Calcium 8.9 - 10.3 mg/dL 8.2(L) 8.1(L) 8.4(L)    STUDIES: Dg Chest 2 View  02/22/2016  CLINICAL DATA:  Chest pain with shortness of breath for several days. History of metastatic breast carcinoma EXAM: CHEST  2 VIEW COMPARISON:  Chest radiograph June 05, 2015; chest CT February 01, 2016 FINDINGS: There is a pulmonary nodular opacity in the left upper lobe near the apex measuring 1.0 x 0.8 cm, essentially stable compared to prior CT examination. The previously noted right middle lobe lesion is not well seen on this current radiographic examination. It is vaguely appreciable on the lateral view where it measures 2.3 x 1.7 cm. There is no appreciable edema or consolidation. The heart size and pulmonary vascularity are within normal limits. There is equivocal hilar adenopathy on the right. Port-A-Cath tip is at the cavoatrial junction. No bone lesions are evident. There are surgical clips in the left breast and left axillary regions. IMPRESSION: Pulmonary nodular lesions in the left upper lobe and right middle lobe, also noted on prior CT. No frank edema or consolidation. No change  in cardiac silhouette. There is questionable right hilar adenopathy currently. No pneumothorax. Postoperative change on left. Electronically Signed   By: Lowella Grip III M.D.   On: 02/22/2016 12:15   Ct Head Wo Contrast  02/22/2016  CLINICAL DATA:  Altered mental status with increased fatigue and somnolence. Breast carcinoma, currently receiving chemotherapy EXAM: CT HEAD WITHOUT CONTRAST TECHNIQUE: Contiguous axial images were obtained from the base of the skull through the vertex without intravenous contrast. COMPARISON:  September 28, 2015 FINDINGS: There is extensive vasogenic edema throughout much of the right frontal and temporal lobes as well as involving the anterior right occipital lobe. This extensive vasogenic edema extends throughout much of the right basal ganglia region. It effaces a portion of the right lateral ventricle with patient of the midline structures toward the left by 7 mm. Third ventricle is mildly effaced and shifted toward the left. The fourth ventricle is midline and normal in appearance. There is no hemorrhage. There are no subdural or epidural fluid collections. No acute infarct is evident. The bony calvarium appears intact. The mastoid air cells are clear. Visualized orbits appear symmetric bilaterally. IMPRESSION: Extensive vasogenic edema on the right causing effacement of the right lateral ventricle and to a lesser extent the third ventricle and midline shift of 7 mm toward the left. This finding  most likely is due to underlying mass or masses on the right. No hemorrhage seen. No acute infarct evident. Advise brain MRI pre and post-contrast to further evaluate this area of presumed neoplastic involvement in the right side of the brain. Critical Value/emergent results were called by telephone at the time of interpretation on 02/22/2016 at 12:20 pm to Dr. Zenovia Jarred , who verbally acknowledged these results. Electronically Signed   By: Lowella Grip III M.D.   On:  02/22/2016 12:21   Mr Jeri Cos MW Contrast  02/22/2016  CLINICAL DATA:  Abnormal head CT with vasogenic edema on the right. Metastatic breast cancer. EXAM: MRI HEAD WITHOUT AND WITH CONTRAST TECHNIQUE: Multiplanar, multiecho pulse sequences of the brain and surrounding structures were obtained without and with intravenous contrast. CONTRAST:  33m MULTIHANCE GADOBENATE DIMEGLUMINE 529 MG/ML IV SOLN COMPARISON:  Head CT earlier same day. MRI 08/20/2013. CT 09/28/2015 FINDINGS: No abnormality affects the brainstem or cerebellum. 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 In the right hemisphere, there is an intra-axial mass measuring 3 cm in diameter centered at the deep insula. There is extensive vasogenic edema emanating throughout the right temporal lobe an the right frontoparietal region. There is a second region of intra-axial enhancing mass in the posterior temporal lobe on the right with vasogenic edema. No other foci are identified. There are micro hemorrhagic foci within these tumors. The differential diagnosis in this case is that of multifocal glioblastoma versus metastatic disease. There is mass effect with maximal right-to-left shift measuring 1 cm. Threatening uncal herniation on the right. No evidence of ventricular trapping. No extra-axial fluid collection. IMPRESSION: Two enhancing masses within the right hemisphere, the larger centered in the deep insula measuring 3 cm in diameter with extensive surrounding vasogenic edema. The smaller is in the posterior temporal lobe measuring approximately 14 mm in size, also with surrounding edema. Mass effect with right-to-left shift of 1 cm. Micro hemorrhages present in both of these lesions. In a patient with a history of metastatic breast cancer, this could certainly represent that process. However, multifocal glioblastoma could also present with this appearance. The processes rapidly progressive, not diagnosable  in October of 2016. I am in the process of calling this report to the clinical service. Electronically Signed   By: MNelson ChimesM.D.   On: 02/22/2016 14:53   Mr Card Morphology Wo/w Cm  02/21/2016  CLINICAL DATA:  50year old female with breast cancer and a left ventricular mass seen on echocardiogram. EXAM: CARDIAC MRI TECHNIQUE: The patient was scanned on a 1.5 Tesla GE magnet. A dedicated cardiac coil was used. Functional imaging was done using Fiesta sequences. 2,3, and 4 chamber views were done to assess for RWMA's. Modified Simpson's rule using a short axis stack was used to calculate an ejection fraction on a dedicated work sConservation officer, nature The patient received 24 cc of Multihance. After 10 minutes inversion recovery sequences were used to assess for infiltration and scar tissue. CONTRAST:  24 cc  of Multihance COMPARISON:  Echocardiogram performed on 02/08/2016 FINDINGS: 1. Mildly dilated left ventricle with normal thickness and mildly decreased systolic function (LVEF = 47%). There is hypokinesis in the apex, apical segments and mid anteroseptal walls. There is no late gadolinium enhancement in the left ventricular endocardium. LVEDD:  55 mm LVESD:  43 mm LVEDV:  140 ml LVESV:  74 ml SV:  66 ml CO:  6.5 L/minute 2.  Normal right ventricular size, thickness and  systolic function. 3.  Normal biatrial size. 4.  Mild mitral and trivial tricuspid regurgitation. 5. There is a pedunculated, mobile mass attached is attached with a thin neck to the left ventricular apex that measures 13 x 9 mm. There is no perfusion or late gadolinium enhancement on regular and long TI images consistent with a thrombus. 6.  No pericardial effusion. IMPRESSION: 1. Mildly dilated left ventricle with normal thickness and mildly decreased systolic function (LVEF = 47%). There is hypokinesis in the apex, apical segments and mid anteroseptal walls. There is no late gadolinium enhancement in the left ventricular  endocardium. 2.  Normal right ventricular size, thickness and systolic function. 3.  Mild mitral and trivial tricuspid regurgitation. 4. There is a pedunculated, mobile mass attached is attached with a thin neck to the left ventricular apex that measures 13 x 9 mm. There is no perfusion or late gadolinium enhancement on regular and long TI images consistent with a thrombus. When compared to the echocardiogram from 02/08/2016 the thrombus is smaller, previously measuring 16 x 13 mm. A continuation of systemic anticoagulation is recommended. Ena Dawley Electronically Signed   By: Ena Dawley   On: 02/21/2016 16:14      ASSESSMENT: 50 y.o. [place] woman  (1)  status post left lumpectomy under the care of Dr. 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 2 of 23 lymph nodes were involved.  Margins were clear.  (2) status post adjuvant chemotherapy consisting of 6 q. three-week doses of docetaxel/ doxorubicin/ cyclophosphamide given between January 2007 and may of 2007, with last dose on 04/20/2006.  (3) status post radiation therapy to the left breast, completed 07/11/2006  (4) began on tamoxifen in early August 2007 and continued until October 2009. She status post hysterectomy with bilateral salpingo-oophorectomy on 07/15/2008, and was started on letrozole in October of 2009.  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 PET and CT of the chest showed no evidence of metastasis in January 2011.  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 Margins were clear. Patient  underwent concurrent left latissimus flap reconstruction and right implant reconstruction.  (7)  status post additional chemotherapy with one cycle of carboplatin/gemcitabine given on 03/11/2010. She then received 5 q. three-week cycles of IV CMF between 03/25/2010 and 06/17/2010.  (8) started on exemestane in January 2012 and continued until late November 2013 when she was hospitalized for apparent TIA at which time her exemestane was stopped and not resumed  (9) METASTATIC DISEASE: evidence of disease recurence noted on chest CT, liver MRI and PET scan in December 2014, with suspicious lung nodules, lymphadenopathy, and 3 lesions in the right lobe of the liver, but no evidence of bony disease and no evidence of brain involvement by brain MRI September 2014. Biopsy of a left supraclavicular lymph node on 12/11/2013 confirmed metastatic carcinoma, estrogen receptor 45% positive, progesterone receptor 17% positive, with no HER-2 amplification.  (10) fulvestrant started 12/03/2013, discontinued 03/25/2014 with progression  (11) exemestane/ everolimus started 04/04/2014; everolimus held 04/14/2014, with skin rash and mouth soreness; resumed at 5 mg a day as of 04/29/2014, discontinued 05/09/2014 with similar  symptoms  (12) continuing exemestane, adding palbociclib 05/26/2014;   (a) palbociclib dose dropped to 75 mg every other day for 21 days beginning with cycle 4  (b) exemestane and Palbociclib discontinued December 2015 with evidence of progression  (13) [facility] referral placed 04/28/2049 -- re-sent 05/23/2014-- shows Pi3KCA and TP53 mutations  (a) [facility] one test requested 12/10/2014-- confirms [employer] results  (b) did not qualify for capecitabine/ BYL 719 trial because of elevated lipase  (14) started eribulin 12/26/2014, stopped 01/23/2015 after 2 cycles because of neuropathy; repeat liver MRI 02/25/2015 shows progression   (15) capecitabine started 03/23/2015, initially 2 weeks 1 week  off, then every other week starting with cycle 2.   (a) dose dropped from 2g BID to 1.5g BID starting on 04/26/15  (b) stopped 05/17/2015 (after 4 cycles) with progression  (16) started carboplatin/ gemcitabine 05/26/2015, given day one and day 8 of each 21 day cycle for a total of 8 cycles, last dose 11/10/2015, stopped because of persistent cytopenias  (a) given her history of severe neutropenia with this chemotherapy we'll do Neupogen days 2, 3 and 4 of each cycle and onpro day 9  (b) carbo dose dropped by 25% w cycle 2 due to very low platelet nadir  (c) CT scans after 3 cycles (07/27/2015) show measurable response  (d) CT scans after 6 cycles (09/28/2015) show continuing response  (e) CT scans post-treatment (11/23/2015) shows continuing response  (17) Left pleural effusion\   (a) s/p L thoracentesis 05/22/2015, 05/29/2015 and 06/05/2015  (b) attempt at pleurx 06/12/2015 cancelled as effusion loculated and scant  (18) symptomatic anemia  (a) Aranesp started 07/28/2015, retic <1% on 10/06/2015  (b) Feraheme given 08/04/2015; ferritin >2K on 10/06/2015, last dose on 11/1  (19) liposomal doxorubicin started 11/24/2015, discontinued after 3d dose 01/19/2016 with decreased EF  (a) echo 03/23/2015 shows an EF of 55%  (b) echo 02/08/2016 shows EF 40-45% and 1.6 cm apical pedunculated mass  (20) vinorelbine to started 02/16/2016, to be given days 1 and 8 each 21 day cycle  (21) chronic pain: controlled on methadone 2.5 mg po TID with tramadol for breakthrough pain  (a) methylphenidate 5 mg w breakfast added for excessive somnolence  (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 We are dealing with an incurable cancer which has been extensively treated. As the cancer learns to survive different therapies, further treatments become less effective. At the same time, we have progressive side effects and complications that also limit treatment-- a low ejection fraction, a platelet count less than 30K, severe malnutrition. So not only are treatments less effective, but dosing also has to be compromised, making the drugs even less effective.  For those reasons stopping chemotherapy at this point is very reasonable and that weas my suggestion. We should concentrate on quality of life and making Zeffie as functional as possible. Those are Hospice-compatible goals and referral to hospice would be appropriate.   It is also clear Nalaya would not be likely to survive CPR or resuscitation in case of a terminal event (intracranial bleed, new stroke from cardiac embolus, etc.) I recommended a DNR order. She also qualifies for hospice referral with a  less than 3 month projected survival. I gave them a [facility] pamphlet.  After much discussion with Agam not participating in the decision because she was very sedated from pain medications Kristin Hoffman agreed to a DNR. I wrote that order and also modified her medications to avoid anything that might further confuse or limit the patient.  I am hopeful in 1-2 days Cornella will be more consistently alert and able to participate in Rehab. We could then consider a [facility] referral for temporary placement (2-4 weeks). If that proves impossible, the options are SNF or 24/7 care at home. Even with Hospice care that may be difficult for the family, though they are willing to try. I have placed a SW reerral to start these discussions and am hopeful we can have a definitive plan in place over the next few days so a discharge (to a Rehab facility, SNF or home) early next week may be possible.  Appreciate your help to [facility].  Will follow with you.    Chauncey Cruel, MD   03/02/2016 6:32 PM

## 2016-03-02 NOTE — Progress Notes (Signed)
Nutrition Follow-up  DOCUMENTATION CODES:   Severe malnutrition in context of chronic illness  INTERVENTION:  - Will order Boost Breeze po TID, each supplement provides 250 kcal and 9 grams of protein - Continue Prostat TID - RD will continue to monitor for additional needs and interventions  NUTRITION DIAGNOSIS:   Malnutrition related to chronic illness as evidenced by percent weight loss, energy intake < or equal to 75% for > or equal to 1 month, moderate depletion of body fat, moderate depletions of muscle mass. -ongoing  GOAL:   Patient will meet greater than or equal to 90% of their needs -unmet  MONITOR:   PO intake, Supplement acceptance, Labs, Weight trends, Skin  ASSESSMENT:   50 y.o. female with metastatic breast cancer actively undergoing chemotherapy (last session 3/7), ventricular thrombus, pancytopenia presents for slurred speech and weakness on the left side. Symptoms started about 5 days ago and haven't progressed. Initially only her speech was slurred now she is weak on the left side and having difficulty ambulating. Imaging in the ER reveals brain metastases. No complaint of numbness tingling, visual changes or slurred speech.  3/22 Per chart review, pt with poor intakes since previous assessment. Spoke with pt's husband at bedside; multiple other family members in the room. Pt rest with eyes closed and intermittently groaning at time of visit. Husband reports that pt has not been consuming food the past few days but that he and other family members have been encouraging fluid intakes. Pt consumed watermelon earlier today; she sucked on it but did not swallow. Family states that pt has indicated pain associated with swallowing but unsure of any abdominal pain once items are swallowed. They also have concern that L side of throat may be edematous which may be further exacerbating difficulty with swallowing. Husband states that Magic Mouthwash continues to attempt to  assist with swallowing pain.   Pt drank a vanilla Ensure earlier and subsequently had diarrhea. Husband states that RN provided him with Boost Breeze for pt and that he plans to attempt this later today. Encouraged trial of this supplement and to continue to provide pt with liquids as tolerated and that RD will follow-up 3/23 to assess abilities today and further needs. Family very appreciative and state they will likely bring more watermelon for pt today.  Palliative Care following pt for symptom management. Will continue to monitor for POC/GOC and nutrition needs related to the same. Not meeting needs currently. Medications reviewed; Magic Mouthwash BID, 40 mg Megace BID, Risaquad daily, 10 mg IV Reglan QID. IVF: NS @ 100 mL/hr. Labs reviewed; K: 3.1 mmol/L, creatinine low, Ca: 8.2 mg/dL.   3/17 - Patient reports that her nausea is more controlled since the last visit.  - Pt with 1 episode of vomiting yesterday.  - Pt has been consuming some of the Prostat mixed with a beverage of choice, but unable to recall how many per day.  - Family member in room encourages patient to keep consuming the Prostat and said that pt's husband had said that they were going to try a chocolate shake this afternoon.  - Patient is constipated, still with no BM.  - States that she is unable to get enough privacy since she cannot use the restroom alone.  - Patient is still not meeting kcal or protein needs.  - Hopefully with nausea under control she will be able to better tolerate po intake.  - Patient amenable to trying a chocolate shake this afternoon.  - States  that she prefers the [employer] but would be amenable to trying a Chocolate Ensure if the other is not available.  - Will also continue adding Prostat TID to a beverage of pt's choice.   Diet Order:  Diet Heart Room service appropriate?: Yes; Fluid consistency:: Thin  Skin:  Reviewed, no issues  Last BM:  3/21  Height:    Ht Readings from Last 1 Encounters:  02/22/16 5\' 3"  (1.6 m)    Weight:   Wt Readings from Last 1 Encounters:  02/22/16 163 lb (73.936 kg)    Ideal Body Weight:  52.3 kg  BMI:  Body mass index is 28.88 kg/(m^2).  Estimated Nutritional Needs:   Kcal:  2000-2200  Protein:  100-115 grams  Fluid:  >/= 2L  EDUCATION NEEDS:   No education needs identified at this time     Jarome Matin, RD, LDN Inpatient Clinical Dietitian Pager # [phone] After hours/weekend pager # [phone]

## 2016-03-02 NOTE — Progress Notes (Addendum)
[facility] Progress Note   Kristin Hoffman  [id number]  DOB: 09/15/1966  DOA: 02/22/2016 PCP: Drake Leach, MD  Brief narrative: Kristin Hoffman is a 50 y.o. female with metastatic breast cancer, ventricular thrombus, pancytopenia presents for slurred speech, left facial droop and weakness on the left side. Symptoms started about 5 days ago and haven't progressed. Initially only her speech was slurred and there was a facial droop now she is weak on the left side and having difficulty ambulating. Imaging in the ER revealed brain metastases with impending herniation. Medical oncology, radiation oncology, neurosurgery consulted. During this hospitalization she completed 3 stereotactic radiosurgery treatments. Unfortunately functional status remains poor. She continues to have issues with nausea and by mouth intake. Prealbumin level 10.2. Palliative care was consulted for assistance with symptom management.                                                          Subjective: She remains lethargic however can be aroused. Currently denies pain or difficulties breathing.  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 -She presented with left facial, arm/ leg weakness with midline shift and severe vasogenic edema- symptoms improving with Decadron -CT scan of brain without contrast performed on 02/22/2016 showed extensive vasogenic edema on the right causing effacement of the right lateral ventricle with 7 mm midline shift -MRI of brain performed on 02/22/2016 showing 2 enhancing masses within the right atmosphere with extensive surrounding vasogenic edema. Micro-hemorrhages present in both lesions. -During this hospitalization she had 3 treatments of SRS, a last tx on 02/29/2016. -Continues to  have poor functional status, ongoing nausea, minimal by mouth intake. -Palliative care consulted for assistance with symptomatic management. -Plan for her primary oncologist Dr.Magrinat to meet with Kristin Hoffman and her family today to discuss treatment options and medical goals of care.   Nausea -She continues to have minimal oral intake as nausea continues to be an issue. I suspect that brain metastasis are contributing to symptoms. 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 -On 03/02/2016 lab work showed a white count of 4.3, Hg of 9.3 and Plt count of 24,000.   -She received colony-stimulating factor  Weight loss/ Malnutrition  - cont Prostat as ordered by nutritionist -Pre-albumin level of 10.2  Mouth sores - magic mouthwash   Thrombocytopenia  -Platelet count 24,000 on 03/02/2016 -No clinical evidence of bleed.    Ventricular mural thrombus - Dr Jana Hakim last recommended to continue Lovenox despite her low platelets as the benefit outweighed the risk- Dr Marin Olp agrees with this plan -Continue Lovenox 100 mg subcutaneous every 24 hours -Does not have clinical evidence of bleed  Hypokalemia -AM labs on 03/02/2016 showing potassium of 3.1 will replace with IV potassium -Follow up on repeat labs in am  Goals of Care  I spoke to Dr Alvy Bimler of Medical oncology who felt a consultation to palliative care would be appropriate. I agree with her. She has advanced disease and would benefit from symptom management.   Antibiotics: Anti-infectives    Start  Dose/Rate Route Frequency Ordered Stop   02/23/16 1000  fluconazole (DIFLUCAN) tablet 100 mg     100 mg Oral Daily 02/23/16 0722     02/22/16 1800  valACYclovir (VALTREX) tablet 1,000 mg     1,000 mg Oral Daily 02/22/16 1652       Code Status: -she states she wants to be a full  code and if afterwards she is not able to be functional without life support, she would like her family to remove her from life support    Code Status Orders        Start     Ordered   02/22/16 1728  Full code   Continuous     02/22/16 1728    Code Status History    Date Active Date Inactive Code Status Order ID Comments User Context   05/29/2015  9:51 PM 05/30/2015  5:54 PM Full Code [id number]  Toy Baker, MD Inpatient   12/17/2014  2:39 PM 12/18/2014  3:42 AM Full Code [id number]  Azzie Roup, MD HOV   11/08/2012  7:19 PM 11/12/2012  9:42 PM Full Code [id number]  Kathalene Frames, MD ED     Family Communication: husband Disposition Plan: Her primary medical oncologist Dr Jana Hakim will meet with Kristin Hoffman and family today to discuss further treatment options and medical goals of care.  DVT prophylaxis: Lovenox Consultants: Onc- medical and radiation, Neurosurgery Procedures:     Objective: Filed Weights   02/22/16 1043  Weight: 73.936 kg (163 lb)    Intake/Output Summary (Last 24 hours) at 03/02/16 1403 Last data filed at 03/02/16 0700  Gross per 24 hour  Intake   2400 ml  Output   1200 ml  Net   1200 ml     Vitals Filed Vitals:   02/29/16 2203 03/01/16 0432 03/01/16 2150 03/02/16 0646  BP: 149/97 143/90 153/104 149/91  Pulse: 106 89 99 105  Temp: 98.3 F (36.8 C) 98 F (36.7 C) 98 F (36.7 C) 97.9 F (36.6 C)  TempSrc: Oral Oral Oral Oral  Resp: 20 20  20   Height:      Weight:      SpO2: 98% 97% 100% 99%    Exam:  General:  She appears lethargic, however is arousable. Little improvement from yesterday's exam  HEENT: No icterus, No thrush, oral mucosa moist- pupils dilated  Cardiovascular: regular rate and rhythm, S1/S2 No murmur  Respiratory: clear to auscultation bilaterally   Abdomen: Vomiting-  Soft, +Bowel sounds, non tender, non distended, no guarding  MSK: No cyanosis or clubbing- no pedal edema  Neuro: facial droop resolved, strength still  4/5 in left arm and leg   Data Reviewed: Basic Metabolic Panel:  Recent Labs Lab 02/26/16 0810 02/27/16 0323 02/28/16 0340 03/01/16 0430 03/02/16 0444  NA 141 139 139 138 136  K 3.4* 3.4* 3.4* 3.0* 3.1*  CL 107 105 108 109 106  CO2 27 27 26 26 24   GLUCOSE 174* 162* 161* 106* 77  BUN 17 16 17 14 12   CREATININE 0.60 0.51 0.52 0.57 0.41*  CALCIUM 8.5* 8.5* 8.4* 8.1* 8.2*   Liver Function Tests: No results for input(s): AST, ALT, ALKPHOS, BILITOT, PROT, ALBUMIN in the last 168 hours. No results for input(s): LIPASE, AMYLASE in the last 168 hours. No results for input(s): AMMONIA in the last 168 hours. CBC:  Recent Labs Lab 02/25/16 0410  02/27/16 0323 02/28/16 0340 02/29/16 0534 03/01/16 0430 03/02/16 0444  WBC 1.5*  < >  1.8* 1.8* 2.1* 3.6* 4.3  NEUTROABS 1.2*  --  1.4*  --  1.6*  --   --   HGB 9.1*  < > 9.3* 9.1* 9.2* 9.2* 9.3*  HCT 28.0*  < > 28.6* 28.0* 28.2* 28.1* 27.9*  MCV 113.4*  < > 113.9* 113.8* 115.6* 115.2* 114.8*  PLT 25*  < > 31* 26* 25* 23* 24*  < > = values in this interval not displayed. Cardiac Enzymes: No results for input(s): CKTOTAL, CKMB, CKMBINDEX, TROPONINI in the last 168 hours. BNP (last 3 results)  Recent Labs  05/29/15 1521  BNP 29.0    ProBNP (last 3 results) No results for input(s): PROBNP in the last 8760 hours.  CBG: No results for input(s): GLUCAP in the last 168 hours.  Recent Results (from the past 240 hour(s))  Urine culture     Status: None   Collection Time: 02/22/16  3:13 PM  Result Value Ref Range Status   Specimen Description URINE, CLEAN CATCH  Final   Special Requests NONE  Final   Culture   Final    6,000 COLONIES/mL INSIGNIFICANT GROWTH Performed at [facility]    Report Status 02/23/2016 FINAL  Final     Studies: No results found.  Scheduled Meds:  Scheduled Meds: . sodium chloride   Intravenous Once  . acidophilus  1 capsule Oral q morning - 10a  . dexamethasone  4 mg Oral 4 times per day   . enoxaparin  100 mg Subcutaneous Q24H  . escitalopram  20 mg Oral Daily  . feeding supplement  1 Container Oral TID BM  . feeding supplement (PRO-STAT SUGAR FREE 64)  30 mL Oral TID  . fluconazole  100 mg Oral Daily  . magic mouthwash  10 mL Oral TID  . megestrol  400 mg Oral BID  . methadone  2.5 mg Oral 3 times per day  . methylphenidate  10 mg Oral BID WC  . metoCLOPramide (REGLAN) injection  10 mg Intravenous TID AC & HS   Or  . metoCLOPramide  10 mg Oral TID AC & HS  . montelukast  10 mg Oral QHS  . multivitamin with minerals  1 tablet Oral q morning - 10a  . pantoprazole  40 mg Oral Daily  . sodium chloride flush  3 mL Intravenous Q12H  . valACYclovir  1,000 mg Oral Daily   Continuous Infusions: . sodium chloride 100 mL/hr at 03/02/16 0000    Time spent on care of this patient: 25 min   Kelvin Cellar, MD 03/02/2016, 2:03 PM  LOS: 9 days   [facility] Office  [phone] Pager - Text Page per [email or web address] If 7PM-7AM, please contact night-coverage [email or web address]

## 2016-03-03 ENCOUNTER — Other Ambulatory Visit: Payer: Self-pay | Admitting: Nurse Practitioner

## 2016-03-03 ENCOUNTER — Encounter: Payer: Self-pay | Admitting: Oncology

## 2016-03-03 LAB — BASIC METABOLIC PANEL
Anion gap: 6 (ref 5–15)
BUN: 11 mg/dL (ref 6–20)
CALCIUM: 8.1 mg/dL — AB (ref 8.9–10.3)
CO2: 23 mmol/L (ref 22–32)
CREATININE: 0.47 mg/dL (ref 0.44–1.00)
Chloride: 108 mmol/L (ref 101–111)
GFR calc Af Amer: >60 mL/min (ref >60)
GLUCOSE: 77 mg/dL (ref 65–99)
Potassium: 3.4 mmol/L — ABNORMAL LOW (ref 3.5–5.1)
SODIUM: 137 mmol/L (ref 135–145)

## 2016-03-03 LAB — CBC
HCT: 31.2 % — ABNORMAL LOW (ref 36.0–46.0)
Hemoglobin: 10.3 g/dL — ABNORMAL LOW (ref 12.0–15.0)
MCH: 37.1 pg — ABNORMAL HIGH (ref 26.0–34.0)
MCHC: 33 g/dL (ref 30.0–36.0)
MCV: 112.2 fL — ABNORMAL HIGH (ref 78.0–100.0)
PLATELETS: 28 10*3/uL — AB (ref 150–400)
RBC: 2.78 MIL/uL — ABNORMAL LOW (ref 3.87–5.11)
RDW: 26.6 % — ABNORMAL HIGH (ref 11.5–15.5)
WBC: 6.6 10*3/uL (ref 4.0–10.5)

## 2016-03-03 MED ORDER — VITAMINS A & D EX OINT
TOPICAL_OINTMENT | CUTANEOUS | Status: AC
  Administered 2016-03-03: 10:00:00
  Filled 2016-03-03: qty 5

## 2016-03-03 NOTE — Progress Notes (Signed)
FMLA paperwork completed for patient's husband and given to him at the hospital.

## 2016-03-03 NOTE — Progress Notes (Signed)
nurse to send fmla forms for hubby

## 2016-03-03 NOTE — Progress Notes (Signed)
[facility] Progress Note   Kristin Hoffman  OAC:[id number]  DOB: 1966/09/25  DOA: 02/22/2016 PCP: Drake Leach, MD  Brief narrative: Kristin Hoffman is a 50 y.o. female with metastatic breast cancer, ventricular thrombus, pancytopenia presents for slurred speech, left facial droop and weakness on the left side. Symptoms started about 5 days ago and haven't progressed. Initially only her speech was slurred and there was a facial droop now she is weak on the left side and having difficulty ambulating. Imaging in the ER revealed brain metastases with impending herniation. Medical oncology, radiation oncology, neurosurgery consulted. During this hospitalization she completed 3 stereotactic radiosurgery treatments. Unfortunately functional status remains poor. She continues to have issues with nausea and by mouth intake. Prealbumin level 10.2. Palliative care was consulted for assistance with symptom management.    After extensive discussion with patient's oncologist, is concluded that the patient is dying. This point she is not quite somnolent. Her condition is not curable even now, further treatments to extend life are not an option. Oncology met with family and the plan is to transition patient to hospice.                                       Subjective: Patient quite somnolent, unable to get much review of systems.  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 -She presented with left facial, arm/ leg weakness with midline shift and severe vasogenic edema- symptoms improving with Decadron -CT scan of brain without contrast performed on 02/22/2016 showed extensive vasogenic edema on the right causing effacement of the right lateral ventricle with 7 mm midline shift -MRI of brain performed on  02/22/2016 showing 2 enhancing masses within the right atmosphere with extensive surrounding vasogenic edema. Micro-hemorrhages present in both lesions. -During this hospitalization she had 3 treatments of SRS, a last tx on 02/29/2016. -Continues to have poor functional status, ongoing nausea, minimal by mouth intake. After meeting with oncology, plan is to transition to home with hospice. Palliative care will reconsult for helping to transition  Nausea -She continues to have minimal oral intake as nausea continues to be an issue. I suspect that brain metastasis are contributing to symptoms. -She is on IV zofran and dexamethasone  -Palliative care consulted for assistance with symptomatic management Better controlled. Poor by mouth intake  Pancytopenia secondary to chemotherapy -Pancytopenia secondary to chemotherapy. -On 03/02/2016 lab work showed a white count of 4.3, Hg of 9.3 and Plt count of 24,000.   -She received colony-stimulating factor  Weight loss/ Malnutrition  - cont Prostat as ordered by nutritionist -Pre-albumin level of 10.2 Poor by mouth intake, now comfort feeds  Mouth sores - magic mouthwash   Thrombocytopenia  -Platelet count 24,000 on 03/02/2016 -No clinical evidence of bleed.    Ventricular mural thrombus - Dr Jana Hakim last recommended to continue Lovenox despite her low platelets as the benefit outweighed the risk- Dr Marin Olp agrees with this plan -Continue Lovenox 100 mg subcutaneous every 24 hours -Does not have clinical evidence of bleed  Hypokalemia -AM labs on 03/02/2016 showing potassium of 3.1 will replace with IV potassium Cancel further labs for goals of care    Antibiotics: Anti-infectives    Start     Dose/Rate Route  Frequency Ordered Stop   02/23/16 1000  fluconazole (DIFLUCAN) tablet 100 mg     100 mg Oral Daily 02/23/16 0722     02/22/16 1800  valACYclovir (VALTREX) tablet 1,000 mg     1,000 mg Oral Daily 02/22/16 1652       Code  Status: -now DO NOT RESUSCITATE   Family Communication: husband at bedside  Disposition Plan:  home with hospice, possibly as early as tomorrow  DVT prophylaxis: Lovenox Consultants: Onc- medical and radiation, Neurosurgery palliative care     Objective: Filed Weights   02/22/16 1043  Weight: 73.936 kg (163 lb)    Intake/Output Summary (Last 24 hours) at 03/03/16 1555 Last data filed at 03/03/16 0700  Gross per 24 hour  Intake   2710 ml  Output   1100 ml  Net   1610 ml     Vitals Filed Vitals:   03/02/16 0646 03/02/16 1530 03/02/16 2021 03/03/16 0422  BP: 149/91  128/80 130/90  Pulse: 105  107 108  Temp: 97.9 F (36.6 C) 98.5 F (36.9 C) 98 F (36.7 C) 98 F (36.7 C)  TempSrc: Oral Oral Oral Oral  Resp: _0 Height:      Weight:      SpO2: 99% 100% 98% 98%    Exam:  Gen.: Somnolent  Cardiovascular: regular Rhythm, borderline tachycardia   Respiratory: clear to auscultation bilaterally ,  poor inspiratory effort   Abdomen:  soft, nondistended,? Nontender, hypoactive bowel sounds    MSK: No cyanosis or clubbing- Trace pitting edema   Data Reviewed: Basic Metabolic Panel:  Recent Labs Lab 02/27/16 0323 02/28/16 0340 03/01/16 0430 03/02/16 0444 03/03/16 0505  NA 139 139 138 136 137  K 3.4* 3.4* 3.0* 3.1* 3.4*  CL 105 108 109 106 108  CO2 _1 GLUCOSE 162* 161* 106* 77 77  BUN _2 CREATININE 0.51 0.52 0.57 0.41* 0.47  CALCIUM 8.5* 8.4* 8.1* 8.2* 8.1*   Liver Function Tests: No results for input(s): AST, ALT, ALKPHOS, BILITOT, PROT, ALBUMIN in the last 168 hours. No results for input(s): LIPASE, AMYLASE in the last 168 hours. No results for input(s): AMMONIA in the last 168 hours. CBC:  Recent Labs Lab 02/27/16 0323 02/28/16 0340 02/29/16 0534 03/01/16 0430 03/02/16 0444 03/03/16 0505  WBC 1.8* 1.8* 2.1* 3.6* 4.3 6.6  NEUTROABS 1.4*  --  1.6*  --   --   --   HGB 9.3* 9.1* 9.2* 9.2* 9.3* 10.3*  HCT 28.6*  28.0* 28.2* 28.1* 27.9* 31.2*  MCV 113.9* 113.8* 115.6* 115.2* 114.8* 112.2*  PLT 31* 26* 25* 23* 24* 28*   Cardiac Enzymes: No results for input(s): CKTOTAL, CKMB, CKMBINDEX, TROPONINI in the last 168 hours. BNP (last 3 results)  Recent Labs  05/29/15 1521  BNP 29.0    ProBNP (last 3 results) No results for input(s): PROBNP in the last 8760 hours.  CBG: No results for input(s): GLUCAP in the last 168 hours.  No results found for this or any previous visit (from the past 240 hour(s)).   Studies: No results found.  Scheduled Meds:  Scheduled Meds: . sodium chloride   Intravenous Once  . acidophilus  1 capsule Oral q morning - 10a  . dexamethasone  4 mg Oral 4 times per day  . enoxaparin  100 mg Subcutaneous Q24H  . escitalopram  20 mg Oral Daily  . feeding supplement (PRO-STAT SUGAR FREE 64)  30 mL Oral TID  . fluconazole  100 mg Oral Daily  . magic mouthwash  10 mL Oral TID  . methadone  2.5 mg Oral 3 times per day  . methylphenidate  10 mg Oral BID WC  . metoCLOPramide (REGLAN) injection  10 mg Intravenous TID AC & HS   Or  . metoCLOPramide  10 mg Oral TID AC & HS  . montelukast  10 mg Oral QHS  . multivitamin with minerals  1 tablet Oral q morning - 10a  . pantoprazole  40 mg Oral Daily  . sodium chloride flush  3 mL Intravenous Q12H  . valACYclovir  1,000 mg Oral Daily   Continuous Infusions: . sodium chloride 100 mL/hr at 03/03/16 0420    Time spent on care of this patient: 15  min   Annita Brod, MD 03/03/2016, 3:55 PM  LOS: 10 days   [facility] Office  [phone] Pager - Text Page per [email or web address] If 7PM-7AM, please contact night-coverage [email or web address]

## 2016-03-04 DIAGNOSIS — I213 ST elevation (STEMI) myocardial infarction of unspecified site: Secondary | ICD-10-CM

## 2016-03-04 DIAGNOSIS — D72819 Decreased white blood cell count, unspecified: Secondary | ICD-10-CM

## 2016-03-04 DIAGNOSIS — D696 Thrombocytopenia, unspecified: Secondary | ICD-10-CM

## 2016-03-04 MED ORDER — ONDANSETRON HCL 4 MG/2ML IJ SOLN
4.0000 mg | Freq: Four times a day (QID) | INTRAMUSCULAR | Status: DC
  Administered 2016-03-04 – 2016-03-05 (×4): 4 mg via INTRAVENOUS
  Filled 2016-03-04 (×4): qty 2

## 2016-03-04 MED ORDER — CLONAZEPAM 0.125 MG PO TBDP: 0.2500 mg | ORAL_TABLET | Freq: Three times a day (TID) | ORAL | Status: DC

## 2016-03-04 MED ORDER — POTASSIUM CHLORIDE CRYS ER 20 MEQ PO TBCR
40.0000 meq | EXTENDED_RELEASE_TABLET | Freq: Once | ORAL | Status: DC
  Filled 2016-03-04: qty 2

## 2016-03-04 NOTE — Progress Notes (Signed)
CSW continuing to follow.   CSW received notification from [facility] that pt has a bed available tomorrow at [facility]   Pt husband in hallway upon CSW arrival to the nursing unit. CSW notified pt husband that bed available at [facility]. CSW provided emotional support as pt husband discussed that pt has been more alert today and pt husband has found comfort in being about to have conversations with pt today.   [facility] liaison plans to meet with pt husband at 19 am for admission paperwork. Husband aware and will be present in pt room.   Weekend CSW to facilitate pt discharge to [facility] on Saturday.  Alison Murray, MSW, [place] Work [phone]

## 2016-03-04 NOTE — Progress Notes (Signed)
Met with [name] and Hien to discuss goals of care and hospice care options. Arden is extremely weak, she is yawning frequently, speech is slurred, unequal pupils with a slight gaze abnormality more pronounced on the right and has increasing agitation, muscle tension, and pain with movement. She is approaching EOL. Her husband [name] shares with me how difficult this has been for her and for their family- both he and Terrilee accept that her death is inevitable now and are hopeful that she does not suffer-they have a strong faith and are coping as well as can be expected with a very strong church community support and are surrounded by people who are providing love and support for their family.  I recommended hospice facility for her care. [name] expressed tremendous relief over this decision-they are clearly both ready. [name] does not want to prolong her suffering and says he wants to focus on celebrating her life and helping his kids through this-Fatemah agreed with [name] and deferred to him on all decisions.  Prognosis: <2 weeks, she has minimal to no PO intake for nutrition and is showing signs of impending herniation clinically.  Will ask CSW to place a referral for a [place]. I provided reassurance that hospice would support their family and provide the best possible care and comfort.   I modified her medication list to reflect comfort care.  Discontinued stimulants due to restlessness and agitation/  Stopped Tramadol because it lowers the seizure threshold-started very low dose klonopin for restlessness and added seizure prophylaxis.  Stopped other non-essential medications.   Continue oral methadone- she is able to take pills but I am not sure how much longer she will be able to do that.  Continue Decadron for cerebral edema and persistent nausea.   Lane Hacker, DO Palliative Medicine [phone]

## 2016-03-04 NOTE — Progress Notes (Signed)
[facility]                                                                              Patient Demographics  Kristin Hoffman, is a 50 y.o. female, DOB - 07-08-1966, BBC:[id number]  Admit date - 02/22/2016   Admitting Physician Debbe Odea, MD  Outpatient Primary MD for the patient is Drake Leach, MD  LOS - 11   Chief Complaint  Patient presents with  . Aphasia      HPI on 02/22/2016 by Dr. Debbe Odea Kristin Hoffman is a 50 y.o. female with metastatic breast cancer, ventricular thrombus, pancytopenia presents for slurred speech and weakness on the left side. Symptoms started about 5 days ago and haven't progressed. Initially only her speech was slurred now she is weak on the left side and having difficulty ambulating. Imaging in the ER reveals brain metastases. No complaint of numbness tingling, visual changes or slurred speech.  Interim history Medical oncology, radiation oncology, neurosurgery consulted. During this hospitalization she completed 3 stereotactic radiosurgery treatments. Unfortunately functional status remains poor. She continues to have issues with nausea and by mouth intake. Prealbumin level 10.2. Palliative care was consulted for assistance with symptom management.  After extensive discussion with patient's oncologist, is concluded that the patient is dying. This point she is not quite somnolent. Her condition is not curable even now, further treatments to extend life are not an option. Oncology met with family and the plan is to transition patient to hospice.  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 -She presented with left facial, arm/ leg weakness with midline shift and severe vasogenic edema- symptoms improving with Decadron -CT scan of  brain without contrast performed on 02/22/2016 showed extensive vasogenic edema on the right causing effacement of the right lateral ventricle with 7 mm midline shift -MRI of brain performed on 02/22/2016 showing 2 enhancing masses within the right atmosphere with extensive surrounding vasogenic edema. Micro-hemorrhages present in both lesions. -During this hospitalization she had 3 treatments of SRS, a last tx on 02/29/2016. -Continues to have poor functional status, ongoing nausea, minimal by mouth intake. After meeting with oncology, plan is to transition to home with hospice. Palliative care will reconsult for helping to transition -Spoke with patient's husband regarding hospice.  He seems agreeable to residential hospice.  Palliative care reconsulted.  -SW consulted for residential hospice  Nausea -She continues to have minimal oral intake as nausea continues to be an issue. -likely secondary to brain metastasis are contributing to symptoms. 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 -On 03/02/2016 lab work showed a white count of 4.3, Hg of 9.3 and Plt count of 24,000.  -She received colony-stimulating factor  Weight loss/ Malnutrition  -cont Prostat as ordered by nutritionist -Pre-albumin level of 10.2 -Poor by mouth intake, now comfort feeds  Mouth sores -Continue magic mouthwash  Ventricular mural thrombus -Dr Jana Hakim last recommended  to continue Lovenox despite her low platelets as the benefit outweighed the risk- Dr Marin Olp agrees with this plan -Continue lovenox  Hypokalemia -No further lab draws, comfort care  Code Status: DNR  Family Communication: Husband at bedside  Disposition Plan: Admitted. Pending hospice.    Time Spent in minutes   30 minutes  Procedures  None  Consults   Palliative care Oncology    Radiation oncology Neurosurgery  DVT Prophylaxis  Lovenox  Lab Results  Component Value Date   PLT 28* 03/03/2016    Medications  Scheduled Meds: . sodium chloride   Intravenous Once  . clonazepam  0.25 mg Oral TID  . dexamethasone  4 mg Oral 4 times per day  . enoxaparin  100 mg Subcutaneous Q24H  . feeding supplement (PRO-STAT SUGAR FREE 64)  30 mL Oral TID  . fluconazole  100 mg Oral Daily  . magic mouthwash  10 mL Oral TID  . methadone  2.5 mg Oral 3 times per day  . metoCLOPramide (REGLAN) injection  10 mg Intravenous TID AC & HS   Or  . metoCLOPramide  10 mg Oral TID AC & HS  . ondansetron (ZOFRAN) IV  4 mg Intravenous 4 times per day  . sodium chloride flush  3 mL Intravenous Q12H  . valACYclovir  1,000 mg Oral Daily   Continuous Infusions:  PRN Meds:.acetaminophen **OR** acetaminophen, feeding supplement (ENSURE ENLIVE), morphine injection, sodium chloride flush  Antibiotics    Anti-infectives    Start     Dose/Rate Route Frequency Ordered Stop   02/23/16 1000  fluconazole (DIFLUCAN) tablet 100 mg     100 mg Oral Daily 02/23/16 0722     02/22/16 1800  valACYclovir (VALTREX) tablet 1,000 mg     1,000 mg Oral Daily 02/22/16 1652        Subjective:   Kristin Hoffman seen and examined today.  Patient somnolent.    Objective:   Filed Vitals:   03/03/16 1530 03/03/16 2133 03/04/16 0523 03/04/16 1100  BP: 157/98 126/79 148/87   Pulse: 111 110 101 99  Temp: 98.8 F (37.1 C) 98 F (36.7 C) 97.5 F (36.4 C)   TempSrc: Oral Oral Oral   Resp: _0 Height:      Weight:      SpO2: 99% 98% 100%     Wt Readings from Last 3 Encounters:  02/22/16 73.936 kg (163 lb)  02/16/16 74.072 kg (163 lb 4.8 oz)  02/09/16 76.749 kg (169 lb 3.2 oz)     Intake/Output Summary (Last 24 hours) at 03/04/16 1247 Last data filed at 03/04/16 0600  Gross per 24 hour  Intake   2300 ml  Output   2300 ml  Net      0 ml    Exam  General: Well developed, chronically  ill  Cardiovascular: S1/S2. RRR  Respiratory: Poor inspiratory effort  Data Review   Micro Results No results found for this or any previous visit (from the past 240 hour(s)).  Radiology Reports Dg Chest 2 View  02/22/2016  CLINICAL DATA:  Chest pain with shortness of breath for several days. History of metastatic breast carcinoma EXAM: CHEST  2 VIEW COMPARISON:  Chest radiograph June 05, 2015; chest CT February 01, 2016 FINDINGS: There is a pulmonary nodular opacity in the left upper lobe near the apex measuring 1.0 x 0.8 cm, essentially stable compared to prior CT examination. The previously noted right middle lobe lesion is not well  seen on this current radiographic examination. It is vaguely appreciable on the lateral view where it measures 2.3 x 1.7 cm. There is no appreciable edema or consolidation. The heart size and pulmonary vascularity are within normal limits. There is equivocal hilar adenopathy on the right. Port-A-Cath tip is at the cavoatrial junction. No bone lesions are evident. There are surgical clips in the left breast and left axillary regions. IMPRESSION: Pulmonary nodular lesions in the left upper lobe and right middle lobe, also noted on prior CT. No frank edema or consolidation. No change in cardiac silhouette. There is questionable right hilar adenopathy currently. No pneumothorax. Postoperative change on left. Electronically Signed   By: Bretta Bang III M.D.   On: 02/22/2016 12:15   Ct Head Wo Contrast  02/22/2016  CLINICAL DATA:  Altered mental status with increased fatigue and somnolence. Breast carcinoma, currently receiving chemotherapy EXAM: CT HEAD WITHOUT CONTRAST TECHNIQUE: Contiguous axial images were obtained from the base of the skull through the vertex without intravenous contrast. COMPARISON:  September 28, 2015 FINDINGS: There is extensive vasogenic edema throughout much of the right frontal and temporal lobes as well as involving the anterior right  occipital lobe. This extensive vasogenic edema extends throughout much of the right basal ganglia region. It effaces a portion of the right lateral ventricle with patient of the midline structures toward the left by 7 mm. Third ventricle is mildly effaced and shifted toward the left. The fourth ventricle is midline and normal in appearance. There is no hemorrhage. There are no subdural or epidural fluid collections. No acute infarct is evident. The bony calvarium appears intact. The mastoid air cells are clear. Visualized orbits appear symmetric bilaterally. IMPRESSION: Extensive vasogenic edema on the right causing effacement of the right lateral ventricle and to a lesser extent the third ventricle and midline shift of 7 mm toward the left. This finding most likely is due to underlying mass or masses on the right. No hemorrhage seen. No acute infarct evident. Advise brain MRI pre and post-contrast to further evaluate this area of presumed neoplastic involvement in the right side of the brain. Critical Value/emergent results were called by telephone at the time of interpretation on 02/22/2016 at 12:20 pm to Dr. Bary Castilla , who verbally acknowledged these results. Electronically Signed   By: Bretta Bang III M.D.   On: 02/22/2016 12:21   Mr Laqueta Jean HM Contrast  02/22/2016  CLINICAL DATA:  Abnormal head CT with vasogenic edema on the right. Metastatic breast cancer. EXAM: MRI HEAD WITHOUT AND WITH CONTRAST TECHNIQUE: Multiplanar, multiecho pulse sequences of the brain and surrounding structures were obtained without and with intravenous contrast. CONTRAST:  15mL MULTIHANCE GADOBENATE DIMEGLUMINE 529 MG/ML IV SOLN COMPARISON:  Head CT earlier same day. MRI 08/20/2013. CT 09/28/2015 FINDINGS: No abnormality affects the brainstem or cerebellum. 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 In the right hemisphere, there is an intra-axial mass measuring 3 cm in  diameter centered at the deep insula. There is extensive vasogenic edema emanating throughout the right temporal lobe an the right frontoparietal region. There is a second region of intra-axial enhancing mass in the posterior temporal lobe on the right with vasogenic edema. No other foci are identified. There are micro hemorrhagic foci within these tumors. The differential diagnosis in this case is that of multifocal glioblastoma versus metastatic disease. There is mass effect with maximal right-to-left shift measuring 1 cm. Threatening uncal herniation on the right. No evidence  of ventricular trapping. No extra-axial fluid collection. IMPRESSION: Two enhancing masses within the right hemisphere, the larger centered in the deep insula measuring 3 cm in diameter with extensive surrounding vasogenic edema. The smaller is in the posterior temporal lobe measuring approximately 14 mm in size, also with surrounding edema. Mass effect with right-to-left shift of 1 cm. Micro hemorrhages present in both of these lesions. In a patient with a history of metastatic breast cancer, this could certainly represent that process. However, multifocal glioblastoma could also present with this appearance. The processes rapidly progressive, not diagnosable in October of 2016. I am in the process of calling this report to the clinical service. Electronically Signed   By: Nelson Chimes M.D.   On: 02/22/2016 14:53   Mr Card Morphology Wo/w Cm  02/21/2016  CLINICAL DATA:  50 year old female with breast cancer and a left ventricular mass seen on echocardiogram. EXAM: CARDIAC MRI TECHNIQUE: The patient was scanned on a 1.5 Tesla GE magnet. A dedicated cardiac coil was used. Functional imaging was done using Fiesta sequences. 2,3, and 4 chamber views were done to assess for RWMA's. Modified Simpson's rule using a short axis stack was used to calculate an ejection fraction on a dedicated work Conservation officer, nature. The patient received  24 cc of Multihance. After 10 minutes inversion recovery sequences were used to assess for infiltration and scar tissue. CONTRAST:  24 cc  of Multihance COMPARISON:  Echocardiogram performed on 02/08/2016 FINDINGS: 1. Mildly dilated left ventricle with normal thickness and mildly decreased systolic function (LVEF = 47%). There is hypokinesis in the apex, apical segments and mid anteroseptal walls. There is no late gadolinium enhancement in the left ventricular endocardium. LVEDD:  55 mm LVESD:  43 mm LVEDV:  140 ml LVESV:  74 ml SV:  66 ml CO:  6.5 L/minute 2.  Normal right ventricular size, thickness and systolic function. 3.  Normal biatrial size. 4.  Mild mitral and trivial tricuspid regurgitation. 5. There is a pedunculated, mobile mass attached is attached with a thin neck to the left ventricular apex that measures 13 x 9 mm. There is no perfusion or late gadolinium enhancement on regular and long TI images consistent with a thrombus. 6.  No pericardial effusion. IMPRESSION: 1. Mildly dilated left ventricle with normal thickness and mildly decreased systolic function (LVEF = 47%). There is hypokinesis in the apex, apical segments and mid anteroseptal walls. There is no late gadolinium enhancement in the left ventricular endocardium. 2.  Normal right ventricular size, thickness and systolic function. 3.  Mild mitral and trivial tricuspid regurgitation. 4. There is a pedunculated, mobile mass attached is attached with a thin neck to the left ventricular apex that measures 13 x 9 mm. There is no perfusion or late gadolinium enhancement on regular and long TI images consistent with a thrombus. When compared to the echocardiogram from 02/08/2016 the thrombus is smaller, previously measuring 16 x 13 mm. A continuation of systemic anticoagulation is recommended. Ena Dawley Electronically Signed   By: Ena Dawley   On: 02/21/2016 16:14    CBC  Recent Labs Lab 02/27/16 0323 02/28/16 0340 02/29/16 0534  03/01/16 0430 03/02/16 0444 03/03/16 0505  WBC 1.8* 1.8* 2.1* 3.6* 4.3 6.6  HGB 9.3* 9.1* 9.2* 9.2* 9.3* 10.3*  HCT 28.6* 28.0* 28.2* 28.1* 27.9* 31.2*  PLT 31* 26* 25* 23* 24* 28*  MCV 113.9* 113.8* 115.6* 115.2* 114.8* 112.2*  MCH 37.1* 37.0* 37.7* 37.7* 38.3* 37.1*  MCHC 32.5 32.5 32.6  32.7 33.3 33.0  RDW 22.4* 23.0* 23.8* 24.3* 25.4* 26.6*  LYMPHSABS 0.2*  --  0.2*  --   --   --   MONOABS 0.3  --  0.3  --   --   --   EOSABS 0.0  --  0.0  --   --   --   BASOSABS 0.0  --  0.0  --   --   --     Chemistries   Recent Labs Lab 02/27/16 0323 02/28/16 0340 03/01/16 0430 03/02/16 0444 03/03/16 0505  NA 139 139 138 136 137  K 3.4* 3.4* 3.0* 3.1* 3.4*  CL 105 108 109 106 108  CO2 _0 GLUCOSE 162* 161* 106* 77 77  BUN _1 CREATININE 0.51 0.52 0.57 0.41* 0.47  CALCIUM 8.5* 8.4* 8.1* 8.2* 8.1*   ------------------------------------------------------------------------------------------------------------------ estimated creatinine clearance is 82.8 mL/min (by C-G formula based on Cr of 0.47). ------------------------------------------------------------------------------------------------------------------ No results for input(s): HGBA1C in the last 72 hours. ------------------------------------------------------------------------------------------------------------------ No results for input(s): CHOL, HDL, LDLCALC, TRIG, CHOLHDL, LDLDIRECT in the last 72 hours. ------------------------------------------------------------------------------------------------------------------ No results for input(s): TSH, T4TOTAL, T3FREE, THYROIDAB in the last 72 hours.  Invalid input(s): FREET3 ------------------------------------------------------------------------------------------------------------------ No results for input(s): VITAMINB12, FOLATE, FERRITIN, TIBC, IRON, RETICCTPCT in the last 72 hours.  Coagulation profile No results for input(s): INR, PROTIME in the  last 168 hours.  No results for input(s): DDIMER in the last 72 hours.  Cardiac Enzymes No results for input(s): CKMB, TROPONINI, MYOGLOBIN in the last 168 hours.  Invalid input(s): CK ------------------------------------------------------------------------------------------------------------------ Invalid input(s): POCBNP    Halia Franey D.O. on 03/04/2016 at 12:47 PM  Between 7am to 7pm - Pager - [phone]  After 7pm go to [email or web address] - password [email or web address]  And look for the night coverage person covering for me after hours  [facility] Office  [phone]

## 2016-03-04 NOTE — Clinical Social Work Note (Signed)
Clinical Social Work Assessment  Patient Details  Name: Kristin Hoffman MRN: [id number] Date of Birth: 1966-06-30  Date of referral:  03/04/16               Reason for consult:  Discharge Planning                Permission sought to share information with:  Family Supports Permission granted to share information::  Yes, Verbal Permission Granted  Name::     Taneah Masri  Agency::     Relationship::  husband  Contact Information:  [phone]  Housing/Transportation Living arrangements for the past 2 months:  [place] of Information:  Patient, Spouse Patient Interpreter Needed:  None Criminal Activity/Legal Involvement Pertinent to Current Situation/Hospitalization:  No - Comment as needed Significant Relationships:  Spouse Lives with:  Spouse Do you feel safe going back to the place where you live?  No Need for family participation in patient care:  Yes (Comment)  Care giving concerns:  Pt from home with spouse. Pt prognosis 2 weeks or less. Palliative has met with pt and pt husband and recommendation for residential hospice placement.   Social Worker assessment / plan:    CSW received referral for residential hospice placement. PMT MD, Dr. Hilma Favors contacted CSW to notify that goals of care was held with pt and pt husband and they are in agreement to concentrating on pt comfort and transitioning to residential hospice.   CSW met with pt and pt husband at bedside. CSW introduced self and explained role. CSW discussed residential hospice and offered choice. Pt and pt husband choose [facility]. CSW discussed process of referral to [facility] and clarified questions. Pt husband expressed that he feels it will be a good support for pt, pt husband, and their children.   CSW made referral to [facility], [facility]te. [place] will process referral and notify CSW of availability.   CSW will update pt and pt husband as updates are provided from [facility]  about availability.   CSW to continue to follow.   Employment status:  Unemployed Forensic scientist:  Managed Care PT Recommendations:  Not assessed at this time Information / Referral to community resources:  Other (Comment Required) (Residential Hospice Referral)  Patient/Family's Response to care:  Pt alert and oriented during conversation. Pt and pt husband calm and affectionate-both in agreement with comfort measures and transitioning to [facility].   Patient/Family's Understanding of and Emotional Response to Diagnosis, Current Treatment, and Prognosis:  Pt and pt husband were glad to have been able to speak with Dr. Hilma Favors and learn more about pt prognosis in order to comfortably make the decision to seek residential hospice.   Emotional Assessment Appearance:  Appears stated age Attitude/Demeanor/Rapport:  Other (appropriate) Affect (typically observed):  Appropriate, Tearful/Crying Orientation:  Oriented to Self Alcohol / Substance use:  Not Applicable Psych involvement (Current and /or in the community):  No (Comment)  Discharge Needs  Concerns to be addressed:  Discharge Planning Concerns, Grief and Loss Concerns Readmission within the last 30 days:  No Current discharge risk:  Terminally ill Barriers to Discharge:  No Barriers Identified   [place], [place], LCSW 03/04/2016, 2:08 PM  [phone]

## 2016-03-05 MED ORDER — DEXAMETHASONE 4 MG PO TABS: 4.0000 mg | ORAL_TABLET | Freq: Four times a day (QID) | ORAL | Status: AC

## 2016-03-05 MED ORDER — MAGIC MOUTHWASH: 10.0000 mL | Freq: Three times a day (TID) | ORAL | Status: AC

## 2016-03-05 MED ORDER — CLONAZEPAM 0.25 MG PO TBDP: 0.2500 mg | ORAL_TABLET | Freq: Three times a day (TID) | ORAL | Status: AC

## 2016-03-05 MED ORDER — ENSURE ENLIVE PO LIQD: 237.0000 mL | Freq: Two times a day (BID) | ORAL | Status: AC | PRN

## 2016-03-05 MED ORDER — PRO-STAT SUGAR FREE PO LIQD: 30.0000 mL | Freq: Three times a day (TID) | ORAL | Status: AC

## 2016-03-05 MED ORDER — HEPARIN SOD (PORK) LOCK FLUSH 100 UNIT/ML IV SOLN
500.0000 [IU] | INTRAVENOUS | Status: AC | PRN
Start: 2016-03-05 — End: 2016-03-05
  Administered 2016-03-05: 500 [IU]

## 2016-03-05 NOTE — Progress Notes (Signed)
Patient being transferred to [facility] per [facility] transport via stretcher, family to accompany pt to facility, port deaccessed per IV team prior to discharge

## 2016-03-05 NOTE — Clinical Social Work Note (Signed)
Patient to be d/c'ed today to [facility].  Patient and family agreeable to plans will transport via ems RN to call report.  CSW updated patient's husband Belenda Cruise who was at bedside.  Evette Cristal, MSW, [place]

## 2016-03-05 NOTE — Discharge Summary (Signed)
Physician Discharge Summary  SAANVI HAKALA MEQ:[id number] DOB: August 22, 1966 DOA: 02/22/2016  PCP: Drake Leach, MD  Admit date: 02/22/2016 Discharge date: 03/05/2016  Time spent: 45 minutes  Recommendations for Outpatient Follow-up:  Patient will be discharged to [facility].  Patient may follow up with primary care provider as needed.  Patient should continue medications as prescribed.  Patient should follow a comfort feeds diet.   Discharge Diagnoses:  Principal Problem:   Brain metastasis ([place]) Active Problems:   Breast cancer metastasized to multiple sites ([place])   Thrombocytopenia ([place])   Ventricular mural thrombus ([facility])   Leukopenia  Pancytopenia  Discharge Condition: Stable  Diet recommendation: Comfort feeds  Filed Weights   02/22/16 1043  Weight: 73.936 kg (163 lb)    History of present illness:  on 02/22/2016 by Dr. Debbe Odea Kristin Hoffman is a 50 y.o. female with metastatic breast cancer, ventricular thrombus, pancytopenia presents for slurred speech and weakness on the left side. Symptoms started about 5 days ago and haven't progressed. Initially only her speech was slurred now she is weak on the left side and having difficulty ambulating. Imaging in the ER reveals brain metastases. No complaint of numbness tingling, visual changes or slurred speech.  Hospital Course:  Interim history Medical oncology, radiation oncology, neurosurgery consulted. During this hospitalization she completed 3 stereotactic radiosurgery treatments. Unfortunately functional status remains poor. She continues to have issues with nausea and by mouth intake. Prealbumin level 10.2. Palliative care was consulted for assistance with symptom management.  After extensive discussion with patient's oncologist, is concluded that the patient is dying. This point she is not quite somnolent. Her condition is not curable even now, further treatments to extend life are not an option. Oncology met with  family and the plan is to transition patient to hospice.  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 -She presented with left facial, arm/ leg weakness with midline shift and severe vasogenic edema- symptoms improving with Decadron -CT scan of brain without contrast performed on 02/22/2016 showed extensive vasogenic edema on the right causing effacement of the right lateral ventricle with 7 mm midline shift -MRI of brain performed on 02/22/2016 showing 2 enhancing masses within the right atmosphere with extensive surrounding vasogenic edema. Micro-hemorrhages present in both lesions. -During this hospitalization she had 3 treatments of SRS, a last tx on 02/29/2016. -Continues to have poor functional status, ongoing nausea, minimal by mouth intake. After meeting with oncology, plan is to transition to home with hospice. Palliative care will reconsult for helping to transition -Spoke with patient's husband regarding hospice. He seems agreeable to residential hospice. Palliative care reconsulted.  -SW consulted for residential hospice  Nausea -She continues to have minimal oral intake as nausea continues to be an issue. -likely secondary to brain metastasis are contributing to symptoms. 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 -On 03/02/2016 lab work showed a white count of 4.3, Hg of 9.3 and Plt count of 24,000.  -She received colony-stimulating factor  Weight loss/ Malnutrition  -cont Prostat as ordered by nutritionist -Pre-albumin level of 10.2 -Poor by mouth intake, now comfort feeds  Mouth sores -Continue magic mouthwash  Ventricular  mural thrombus -Dr Jana Hakim last recommended to continue Lovenox despite her  low platelets as the benefit outweighed the risk- Dr Marin Olp agrees with this plan -Continue lovenox  Hypokalemia -No further lab draws, comfort care  Code Status: DNR  Procedures  None  Consults  Palliative care Oncology  Radiation oncology Neurosurgery  Discharge Exam: Filed Vitals:   03/04/16 1500 03/04/16 2238  BP: 143/90 150/88  Pulse: 107 101  Temp: 97.3 F (36.3 C) 98 F (36.7 C)  Resp: 20 20   Exam  General: Well developed, chronically ill  Cardiovascular: S1/S2. RRR  Respiratory: Poor inspiratory effort  Discharge Instructions      Discharge Instructions    Discharge instructions    Complete by:  As directed   Patient will be discharged to [facility].  Patient may follow up with primary care provider as needed.  Patient should continue medications as prescribed.  Patient should follow a comfort feeds diet.            Medication List    STOP taking these medications        acidophilus Caps capsule     Biotin 5000 MCG Caps     cetirizine 10 MG tablet  Commonly known as:  ZYRTEC     Cholecalciferol 2000 units Chew     diphenoxylate-atropine 2.5-0.025 MG tablet  Commonly known as:  LOMOTIL     enoxaparin 100 MG/ML injection  Commonly known as:  LOVENOX     escitalopram 20 MG tablet  Commonly known as:  LEXAPRO     LORazepam 0.5 MG tablet  Commonly known as:  ATIVAN     Melatonin 10 MG Tabs     methylphenidate 5 MG tablet  Commonly known as:  RITALIN     montelukast 10 MG tablet  Commonly known as:  SINGULAIR     multivitamin tablet     nystatin 100000 UNIT/ML suspension  Commonly known as:  MYCOSTATIN     omeprazole 40 MG capsule  Commonly known as:  PRILOSEC     ondansetron 8 MG tablet  Commonly known as:  ZOFRAN     prochlorperazine 10 MG tablet  Commonly known as:  COMPAZINE     traMADol 50 MG tablet  Commonly known as:  ULTRAM       valACYclovir 1000 MG tablet  Commonly known as:  VALTREX      TAKE these medications        clonazePAM 0.25 MG disintegrating tablet  Commonly known as:  KLONOPIN  Take 1 tablet (0.25 mg total) by mouth 3 (three) times daily.     dexamethasone 4 MG tablet  Commonly known as:  DECADRON  Take 1 tablet (4 mg total) by mouth every 6 (six) hours.     feeding supplement (ENSURE ENLIVE) Liqd  Take 237 mLs by mouth 2 (two) times daily as needed (Per patient preference).     feeding supplement (PRO-STAT SUGAR FREE 64) Liqd  Take 30 mLs by mouth 3 (three) times daily.     fluconazole 100 MG tablet  Commonly known as:  DIFLUCAN  TAKE 1 TABLET (100 MG TOTAL) BY MOUTH DAILY.     magic mouthwash Soln  Take 10 mLs by mouth 3 (three) times daily.     methadone 5 MG tablet  Commonly known as:  DOLOPHINE  Take 1 tablet (5 mg total) by mouth every 8 (eight) hours.       Allergies  Allergen Reactions  . Afinitor [Everolimus] Hives and Itching      The results of significant  diagnostics from this hospitalization (including imaging, microbiology, ancillary and laboratory) are listed below for reference.    Significant Diagnostic Studies: Dg Chest 2 View  02/22/2016  CLINICAL DATA:  Chest pain with shortness of breath for several days. History of metastatic breast carcinoma EXAM: CHEST  2 VIEW COMPARISON:  Chest radiograph June 05, 2015; chest CT February 01, 2016 FINDINGS: There is a pulmonary nodular opacity in the left upper lobe near the apex measuring 1.0 x 0.8 cm, essentially stable compared to prior CT examination. The previously noted right middle lobe lesion is not well seen on this current radiographic examination. It is vaguely appreciable on the lateral view where it measures 2.3 x 1.7 cm. There is no appreciable edema or consolidation. The heart size and pulmonary vascularity are within normal limits. There is equivocal hilar adenopathy on the right. Port-A-Cath tip is at the  cavoatrial junction. No bone lesions are evident. There are surgical clips in the left breast and left axillary regions. IMPRESSION: Pulmonary nodular lesions in the left upper lobe and right middle lobe, also noted on prior CT. No frank edema or consolidation. No change in cardiac silhouette. There is questionable right hilar adenopathy currently. No pneumothorax. Postoperative change on left. Electronically Signed   By: Lowella Grip III M.D.   On: 02/22/2016 12:15   Ct Head Wo Contrast  02/22/2016  CLINICAL DATA:  Altered mental status with increased fatigue and somnolence. Breast carcinoma, currently receiving chemotherapy EXAM: CT HEAD WITHOUT CONTRAST TECHNIQUE: Contiguous axial images were obtained from the base of the skull through the vertex without intravenous contrast. COMPARISON:  September 28, 2015 FINDINGS: There is extensive vasogenic edema throughout much of the right frontal and temporal lobes as well as involving the anterior right occipital lobe. This extensive vasogenic edema extends throughout much of the right basal ganglia region. It effaces a portion of the right lateral ventricle with patient of the midline structures toward the left by 7 mm. Third ventricle is mildly effaced and shifted toward the left. The fourth ventricle is midline and normal in appearance. There is no hemorrhage. There are no subdural or epidural fluid collections. No acute infarct is evident. The bony calvarium appears intact. The mastoid air cells are clear. Visualized orbits appear symmetric bilaterally. IMPRESSION: Extensive vasogenic edema on the right causing effacement of the right lateral ventricle and to a lesser extent the third ventricle and midline shift of 7 mm toward the left. This finding most likely is due to underlying mass or masses on the right. No hemorrhage seen. No acute infarct evident. Advise brain MRI pre and post-contrast to further evaluate this area of presumed neoplastic involvement in  the right side of the brain. Critical Value/emergent results were called by telephone at the time of interpretation on 02/22/2016 at 12:20 pm to Dr. Zenovia Jarred , who verbally acknowledged these results. Electronically Signed   By: Lowella Grip III M.D.   On: 02/22/2016 12:21   Mr Jeri Cos GN Contrast  02/22/2016  CLINICAL DATA:  Abnormal head CT with vasogenic edema on the right. Metastatic breast cancer. EXAM: MRI HEAD WITHOUT AND WITH CONTRAST TECHNIQUE: Multiplanar, multiecho pulse sequences of the brain and surrounding structures were obtained without and with intravenous contrast. CONTRAST:  57m MULTIHANCE GADOBENATE DIMEGLUMINE 529 MG/ML IV SOLN COMPARISON:  Head CT earlier same day. MRI 08/20/2013. CT 09/28/2015 FINDINGS: No abnormality affects the brainstem or cerebellum. 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 In the  right hemisphere, there is an intra-axial mass measuring 3 cm in diameter centered at the deep insula. There is extensive vasogenic edema emanating throughout the right temporal lobe an the right frontoparietal region. There is a second region of intra-axial enhancing mass in the posterior temporal lobe on the right with vasogenic edema. No other foci are identified. There are micro hemorrhagic foci within these tumors. The differential diagnosis in this case is that of multifocal glioblastoma versus metastatic disease. There is mass effect with maximal right-to-left shift measuring 1 cm. Threatening uncal herniation on the right. No evidence of ventricular trapping. No extra-axial fluid collection. IMPRESSION: Two enhancing masses within the right hemisphere, the larger centered in the deep insula measuring 3 cm in diameter with extensive surrounding vasogenic edema. The smaller is in the posterior temporal lobe measuring approximately 14 mm in size, also with surrounding edema. Mass effect with right-to-left shift of 1 cm. Micro  hemorrhages present in both of these lesions. In a patient with a history of metastatic breast cancer, this could certainly represent that process. However, multifocal glioblastoma could also present with this appearance. The processes rapidly progressive, not diagnosable in October of 2016. I am in the process of calling this report to the clinical service. Electronically Signed   By: Nelson Chimes M.D.   On: 02/22/2016 14:53   Mr Card Morphology Wo/w Cm  02/21/2016  CLINICAL DATA:  50 year old female with breast cancer and a left ventricular mass seen on echocardiogram. EXAM: CARDIAC MRI TECHNIQUE: The patient was scanned on a 1.5 Tesla GE magnet. A dedicated cardiac coil was used. Functional imaging was done using Fiesta sequences. 2,3, and 4 chamber views were done to assess for RWMA's. Modified Simpson's rule using a short axis stack was used to calculate an ejection fraction on a dedicated work Conservation officer, nature. The patient received 24 cc of Multihance. After 10 minutes inversion recovery sequences were used to assess for infiltration and scar tissue. CONTRAST:  24 cc  of Multihance COMPARISON:  Echocardiogram performed on 02/08/2016 FINDINGS: 1. Mildly dilated left ventricle with normal thickness and mildly decreased systolic function (LVEF = 47%). There is hypokinesis in the apex, apical segments and mid anteroseptal walls. There is no late gadolinium enhancement in the left ventricular endocardium. LVEDD:  55 mm LVESD:  43 mm LVEDV:  140 ml LVESV:  74 ml SV:  66 ml CO:  6.5 L/minute 2.  Normal right ventricular size, thickness and systolic function. 3.  Normal biatrial size. 4.  Mild mitral and trivial tricuspid regurgitation. 5. There is a pedunculated, mobile mass attached is attached with a thin neck to the left ventricular apex that measures 13 x 9 mm. There is no perfusion or late gadolinium enhancement on regular and long TI images consistent with a thrombus. 6.  No pericardial effusion.  IMPRESSION: 1. Mildly dilated left ventricle with normal thickness and mildly decreased systolic function (LVEF = 47%). There is hypokinesis in the apex, apical segments and mid anteroseptal walls. There is no late gadolinium enhancement in the left ventricular endocardium. 2.  Normal right ventricular size, thickness and systolic function. 3.  Mild mitral and trivial tricuspid regurgitation. 4. There is a pedunculated, mobile mass attached is attached with a thin neck to the left ventricular apex that measures 13 x 9 mm. There is no perfusion or late gadolinium enhancement on regular and long TI images consistent with a thrombus. When compared to the echocardiogram from 02/08/2016 the thrombus is smaller, previously measuring  16 x 13 mm. A continuation of systemic anticoagulation is recommended. Ena Dawley Electronically Signed   By: Ena Dawley   On: 02/21/2016 16:14    Microbiology: No results found for this or any previous visit (from the past 240 hour(s)).   Labs: Basic Metabolic Panel:  Recent Labs Lab 02/28/16 0340 03/01/16 0430 03/02/16 0444 03/03/16 0505  NA 139 138 136 137  K 3.4* 3.0* 3.1* 3.4*  CL 108 109 106 108  CO2 26 26 24 23   GLUCOSE 161* 106* 77 77  BUN 17 14 12 11   CREATININE 0.52 0.57 0.41* 0.47  CALCIUM 8.4* 8.1* 8.2* 8.1*   Liver Function Tests: No results for input(s): AST, ALT, ALKPHOS, BILITOT, PROT, ALBUMIN in the last 168 hours. No results for input(s): LIPASE, AMYLASE in the last 168 hours. No results for input(s): AMMONIA in the last 168 hours. CBC:  Recent Labs Lab 02/28/16 0340 02/29/16 0534 03/01/16 0430 03/02/16 0444 03/03/16 0505  WBC 1.8* 2.1* 3.6* 4.3 6.6  NEUTROABS  --  1.6*  --   --   --   HGB 9.1* 9.2* 9.2* 9.3* 10.3*  HCT 28.0* 28.2* 28.1* 27.9* 31.2*  MCV 113.8* 115.6* 115.2* 114.8* 112.2*  PLT 26* 25* 23* 24* 28*   Cardiac Enzymes: No results for input(s): CKTOTAL, CKMB, CKMBINDEX, TROPONINI in the last 168  hours. BNP: BNP (last 3 results)  Recent Labs  05/29/15 1521  BNP 29.0    ProBNP (last 3 results) No results for input(s): PROBNP in the last 8760 hours.  CBG: No results for input(s): GLUCAP in the last 168 hours.     SignedCristal Ford  [facility] 03/05/2016, 8:04 AM

## 2016-03-05 NOTE — Consult Note (Signed)
[facility] available today. Paper work has been completed and discharge summary has been faxed. CSW aware.  RN please call report to [phone].  Thank you. Erling Conte, [place]

## 2016-03-08 ENCOUNTER — Other Ambulatory Visit: Payer: 59

## 2016-03-08 ENCOUNTER — Ambulatory Visit: Payer: 59

## 2016-03-08 ENCOUNTER — Ambulatory Visit: Payer: 59 | Admitting: Oncology

## 2016-03-11 NOTE — Progress Notes (Signed)
  Radiation Oncology         [phone] ________________________________  Name: Kristin Hoffman MRN: [id number]  Date: 02/29/2016  DOB: 05/10/1966  End of Treatment Note  Diagnosis:   50 year old woman with stage IV metastatic breast cancer. She presents with 2 isolated brain metastases. Her larger brain metastasis measures 3 cm occupying the deep insula of the right hemisphere. In addition, there is a second posterior right temporal brain metastasis.     Indication for treatment:  Palliation       Radiation treatment dates:   02/24/2016-02/29/2016  Site/dose/beams/energy:    Right basal ganglia target was treated using 4 Rapid Arc VMAT Beams to a prescription dose of 8 Gy for 3 fractions for total of 24 Gy.  ExacTrac registration was performed for each couch angle.  The 100% isodose line was prescribed.  6 MV X-rays were delivered in the flattening filter free beam mode.  Rt Temporal 10 mm target was treated using 4 Dynamic Conformal Arcs to a prescription dose of 20 Gy.  ExacTrac registration was performed for each couch angle.  The 100% isodose line was prescribed.  6 MV X-rays were delivered in the flattening filter free beam mode.  Narrative: The patient tolerated radiation treatment relatively well with no acute complications with treatment.   Plan: The patient has completed radiation treatment. The patient will return to radiation oncology clinic for routine followup in one month. I advised her to call or return sooner if she has any questions or concerns related to her recovery or treatment. ________________________________  Sheral Apley. Tammi Klippel, M.D.  This document serves as a record of services personally performed by [name], MD. It was created on his behalf by Arlyce Harman, a trained medical scribe. The creation of this record is based on the scribe's personal observations and the provider's statements to them. This document has been checked and approved by the attending  provider.

## 2016-03-14 ENCOUNTER — Other Ambulatory Visit: Payer: 59

## 2016-03-15 ENCOUNTER — Telehealth: Payer: Self-pay | Admitting: Oncology

## 2016-03-15 ENCOUNTER — Other Ambulatory Visit: Payer: 59

## 2016-03-15 ENCOUNTER — Ambulatory Visit: Payer: 59

## 2016-03-15 ENCOUNTER — Ambulatory Visit: Payer: 59 | Admitting: Nurse Practitioner

## 2016-03-15 NOTE — Progress Notes (Signed)
Received Death Notice from [facility].  Kristin Hoffman passed on 03/19/16 @ 0350 am.  Notice sent to HIM.

## 2016-03-15 NOTE — Telephone Encounter (Signed)
Per staff message from desk nurse patient passed away 04/12/16 @ 3:50 am. Status changed to deceased.

## 2016-03-16 ENCOUNTER — Other Ambulatory Visit: Payer: Self-pay | Admitting: Oncology

## 2016-03-21 ENCOUNTER — Other Ambulatory Visit: Payer: 59

## 2016-03-22 ENCOUNTER — Other Ambulatory Visit: Payer: 59

## 2016-03-28 ENCOUNTER — Ambulatory Visit: Payer: Self-pay | Admitting: Radiation Oncology

## 2016-03-28 ENCOUNTER — Other Ambulatory Visit: Payer: 59

## 2016-03-29 ENCOUNTER — Other Ambulatory Visit: Payer: 59

## 2016-03-29 ENCOUNTER — Ambulatory Visit: Payer: 59

## 2016-03-29 ENCOUNTER — Ambulatory Visit: Payer: 59 | Admitting: Oncology

## 2016-04-04 ENCOUNTER — Other Ambulatory Visit: Payer: 59

## 2016-04-05 ENCOUNTER — Other Ambulatory Visit: Payer: 59

## 2016-04-05 ENCOUNTER — Ambulatory Visit: Payer: 59

## 2016-04-11 ENCOUNTER — Other Ambulatory Visit: Payer: 59

## 2016-04-11 DEATH — deceased

## 2016-04-18 ENCOUNTER — Other Ambulatory Visit: Payer: 59

## 2016-04-25 ENCOUNTER — Other Ambulatory Visit: Payer: 59

## 2016-04-29 IMAGING — CT CT CHEST W/ CM
2 of 5 series · 15 of 46 positions shown, 17 images · IV contrast (OMNIPAQUE)
Comparison: [id number]

CLINICAL DATA: Recurrent left breast cancer, status post bilateral
mastectomy, chemotherapy in progress, XRT complete. Status post
hysterectomy. Shortness of breath. Diarrhea. Chest discomfort x1
month.

EXAM:
CT CHEST, ABDOMEN, AND PELVIS WITH CONTRAST
TECHNIQUE: Multidetector CT imaging of the chest, abdomen and pelvis was
performed following the standard protocol during bolus
administration of intravenous contrast.
CONTRAST:  100mL OMNIPAQUE IOHEXOL 300 MG/ML  SOLN

[Series 2: cap with st · axial · 0.80mm/px · z∈[-558,-28]mm · 12 of 122 slices shown, 14 images]
[im 8/122  soft-tissue]
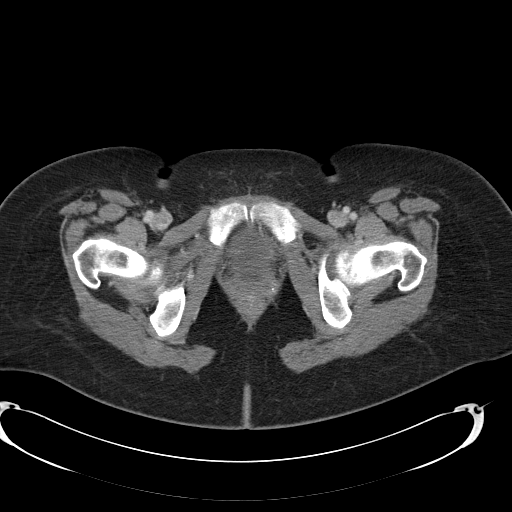
[im 8/122  bone]
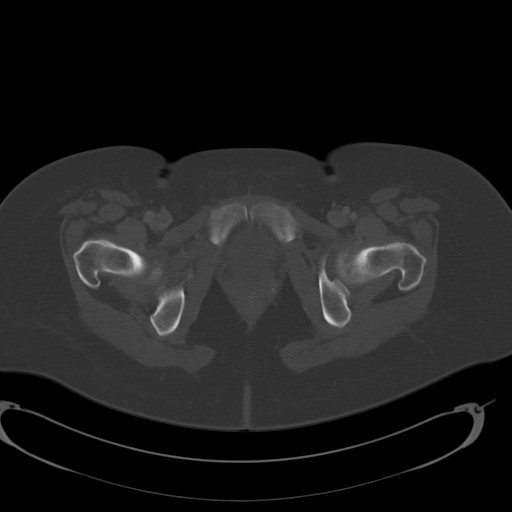
[im 22/122  soft-tissue]
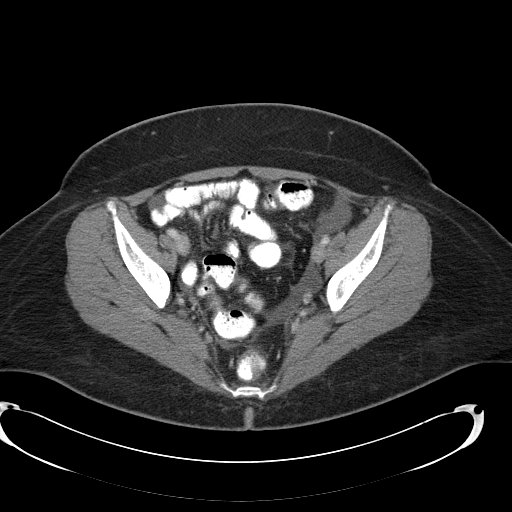
[im 29/122  soft-tissue]
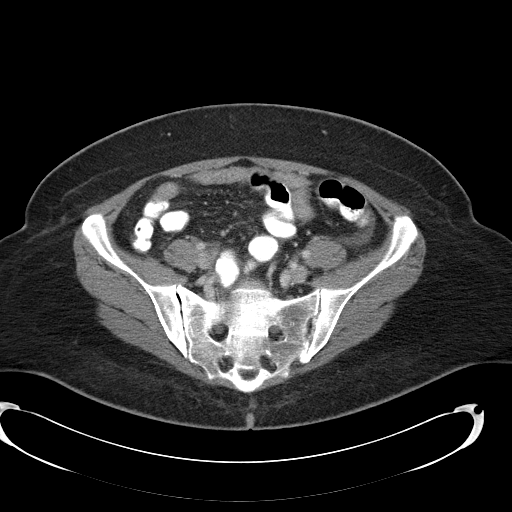
[im 36/122  soft-tissue]
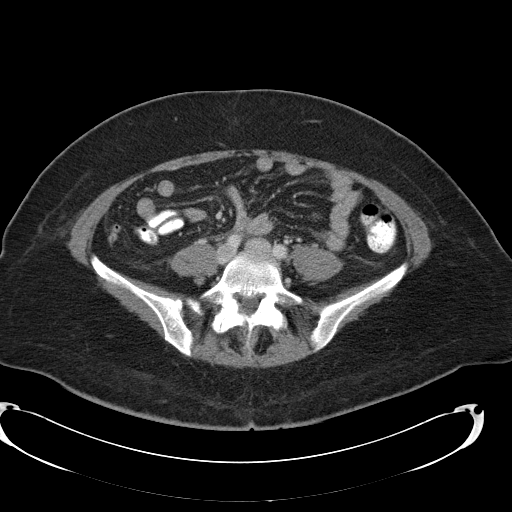
[im 50/122  soft-tissue]
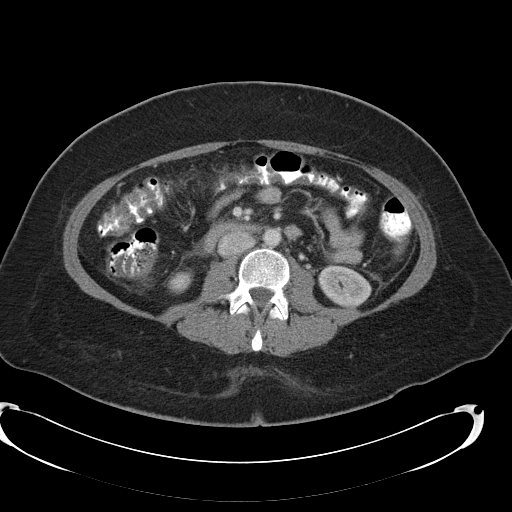
[im 57/122  soft-tissue]
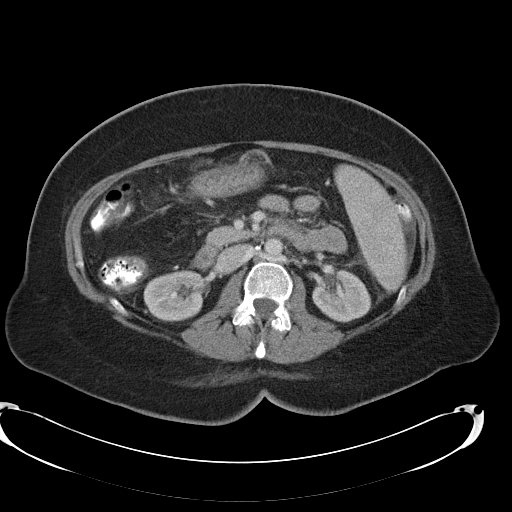
[im 65/122  soft-tissue]
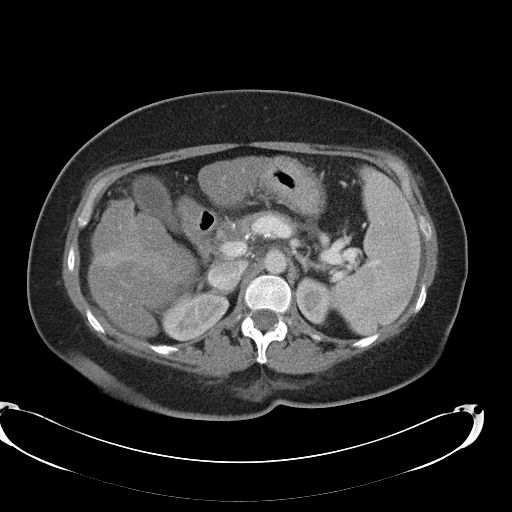
[im 79/122  soft-tissue]
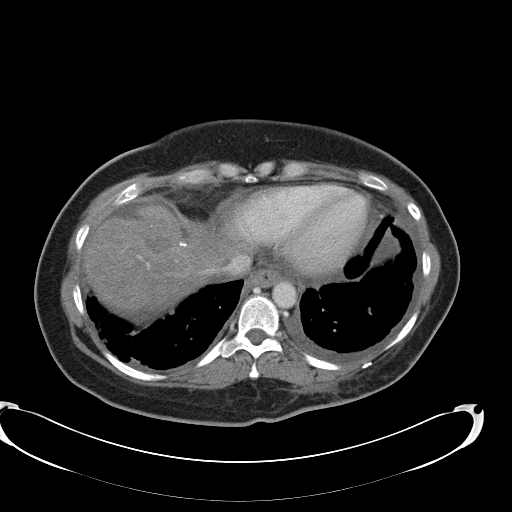
[im 86/122  soft-tissue]
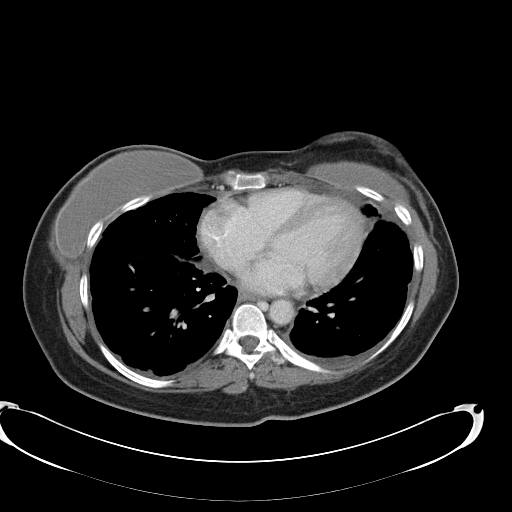
[im 86/122  bone]
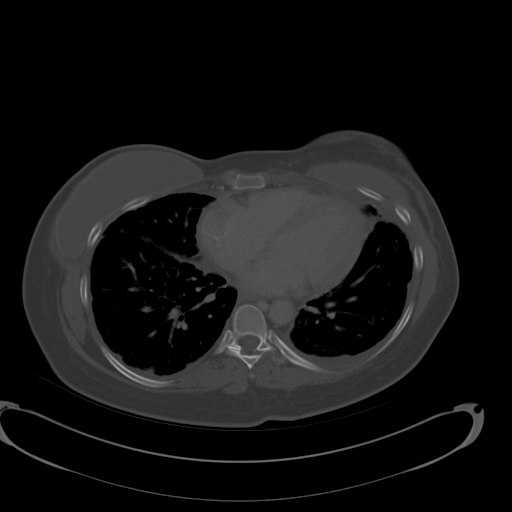
[im 93/122  soft-tissue]
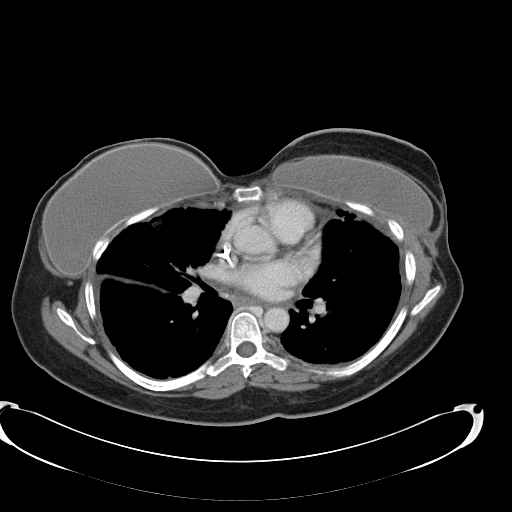
[im 107/122  soft-tissue]
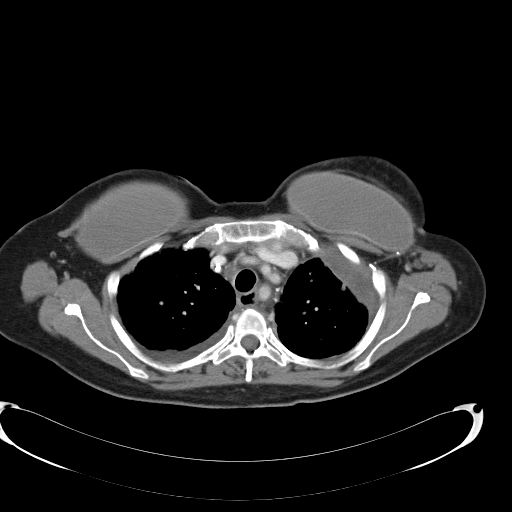
[im 114/122  soft-tissue]
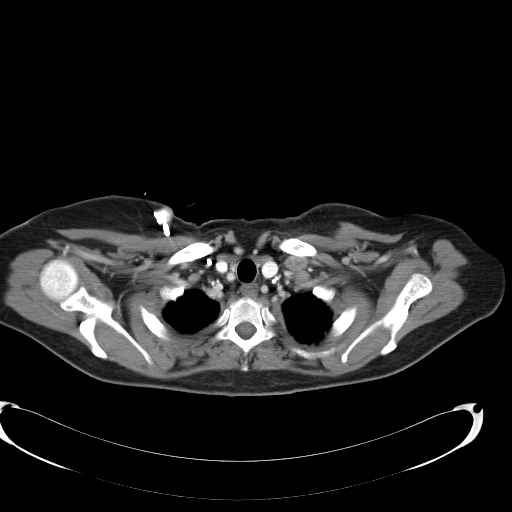

[Series 602: <mpr thick range> · coronal · 1.19mm/px · 3 of 86 slices shown]
[im 29/86  soft-tissue]
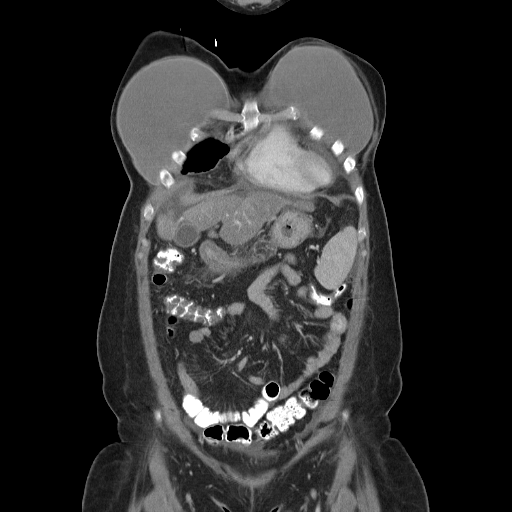
[im 38/86  soft-tissue]
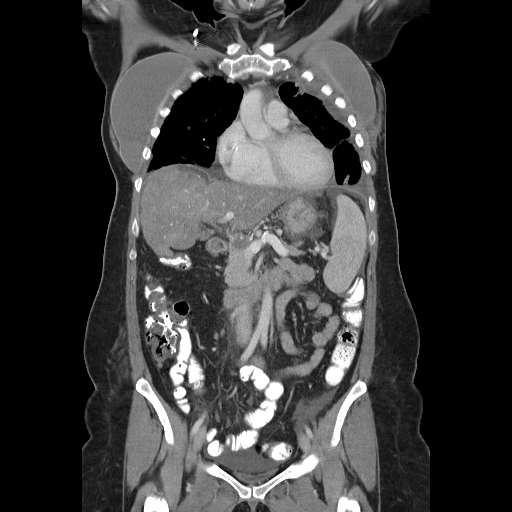
[im 48/86  soft-tissue]
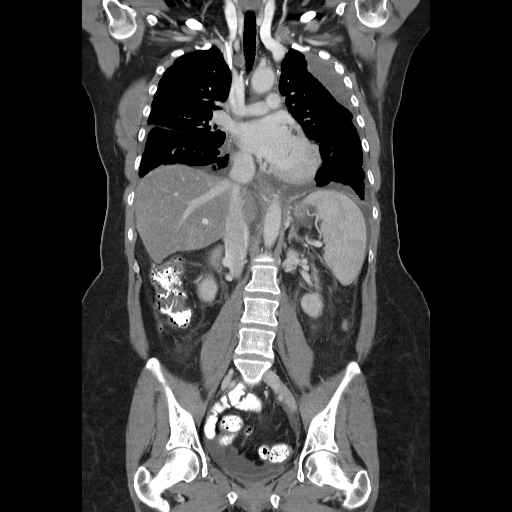

[15 of 46 positions shown; findings below may reference images not displayed]

FINDINGS: CT CHEST FINDINGS

Mediastinum/Nodes: The heart is normal in size. No pericardial
effusion.

Aberrant right subclavian artery. Right chest port terminates in the
upper right atrium.

Improving thoracic nodal metastases, including a

--12 mm short axis left supraclavicular node (series 2/ image 9),
previously 14 mm

--9 mm short axis high right paratracheal node (series 2/image 15),
previously 12 mm

--10 mm short axis left prevascular node (series 2/image 19),
previously 15 mm

Additional small calcified mediastinal nodes, benign. Status post
left axillary lymph node dissection.

Visualized thyroid is unremarkable.

Lungs/Pleura: 1.3 x 1.3 cm necrotic pleural-based nodule along the
anterior right middle lobe (series 2/image 31), previously 1.5 x
cm. Small right pleural effusion along the major fissure and
posterior lung base, mildly improved.

7.1 x 2.1 cm necrotic mass along the anterior left upper chest/chest
wall (series 2/image 21), previously 8.1 x 2.8 cm. Associated small
left pleural effusion, some of which remains loculated anteriorly
along the left chest wall mass.

Again noted is interlobular septal thickening with scattered
pulmonary nodularity, worrisome for lymphangitic spread of tumor. 7
mm nodule in the anterior left upper lobe (series 4/image 16),
previously 9 mm. Additional 8 x 5 mm nodule in the anterior left
upper lobe (series 4/image 13), previously 9 x 4 mm, unchanged.
Additional scattered discrete nodularity is not well visualized on
the current study.

No pneumothorax.

Musculoskeletal: Status post bilateral mastectomy with
reconstruction.

Mild thoracic dextroscoliosis with degenerative changes.

No focal osseous lesions.

CT ABDOMEN PELVIS FINDINGS

Hepatobiliary: Although difficult to discretely measure, the
suspected metastasis along the anterior right hepatic dome measures
approximately 3.9 x 3.6 cm on the current study (series 2/ image
44), previously 4.5 x 4.9 cm. Additional 10 x 10 mm lesion along the
anterior caudate (series 2/ image 55), previously 1.6 x 1.3 cm.
Additional scattered areas of heterogeneous perfusion in the liver.

Again noted is mild gallbladder wall thickening. No intrahepatic or
extrahepatic ductal dilatation.

Pancreas: 2.5 x 1.7 cm hypoenhancing lesion along the pancreatic
head (series 2/ image 59), previously 2.7 x 1.8 cm. Associated
atrophy the pancreatic body/ tail. No pancreatic ductal dilatation.

Spleen: Within normal limits.

Adrenals/Urinary Tract: Adrenal glands are within normal limits.

Kidneys are within normal limits.  No hydronephrosis.

Bladder is mildly thick-walled although underdistended.

Stomach/Bowel: Stomach is within normal limits.

No evidence of bowel obstruction.

Normal appendix (series 2/image 87).

Vascular/Lymphatic: No evidence of abdominal aortic aneurysm.
Circumaortic left renal vein.

Again noted is mild soft tissue stranding adjacent to the caudate
(series 2/image 56), possibly reflecting nodal soft tissue,
equivocal.

Otherwise, no suspicious abdominopelvic lymphadenopathy.

Reproductive: Status post hysterectomy.

No adnexal masses.

Other: Small volume abdominopelvic ascites.

No gross peritoneal nodularity.

Musculoskeletal: Mild lumbar levoscoliosis.

No focal osseous lesions.
IMPRESSION: Status post bilateral mastectomy with left axillary lymph node
dissection.

Improving pleural/parenchymal pulmonary metastases with suspected
lymphangitic spread, as described above.

Improving thoracic lymphadenopathy.

Improving hepatic and pancreatic metastases.

Stable small volume abdominopelvic ascites. No gross peritoneal
disease.

## 2016-05-02 ENCOUNTER — Other Ambulatory Visit: Payer: 59

## 2016-05-03 ENCOUNTER — Ambulatory Visit: Payer: 59

## 2016-05-10 ENCOUNTER — Other Ambulatory Visit: Payer: 59

## 2016-05-16 ENCOUNTER — Other Ambulatory Visit: Payer: 59

## 2016-05-23 ENCOUNTER — Other Ambulatory Visit: Payer: 59

## 2016-05-28 ENCOUNTER — Other Ambulatory Visit: Payer: Self-pay | Admitting: Nurse Practitioner

## 2016-05-30 ENCOUNTER — Other Ambulatory Visit: Payer: 59

## 2016-05-31 ENCOUNTER — Ambulatory Visit: Payer: 59

## 2016-06-06 ENCOUNTER — Other Ambulatory Visit: Payer: 59
